# Patient Record
Sex: Male | Born: 1975 | Race: Black or African American | Hispanic: No | Marital: Married | Smoking: Current every day smoker
Health system: Southern US, Community
[De-identification: ages and names within clinical notes are randomized; demographics above are authoritative.]

## PROBLEM LIST (undated history)

## (undated) DIAGNOSIS — R251 Tremor, unspecified: Secondary | ICD-10-CM

## (undated) DIAGNOSIS — F99 Mental disorder, not otherwise specified: Secondary | ICD-10-CM

## (undated) DIAGNOSIS — E1165 Type 2 diabetes mellitus with hyperglycemia: Secondary | ICD-10-CM

## (undated) DIAGNOSIS — F209 Schizophrenia, unspecified: Secondary | ICD-10-CM

## (undated) DIAGNOSIS — I10 Essential (primary) hypertension: Secondary | ICD-10-CM

## (undated) HISTORY — PX: OTHER SURGICAL HISTORY: SHX169

---

## 1997-12-06 ENCOUNTER — Encounter: Admission: RE | Admit: 1997-12-06 | Discharge: 1997-12-06 | Payer: Self-pay | Admitting: Internal Medicine

## 1999-02-23 ENCOUNTER — Emergency Department (HOSPITAL_COMMUNITY): Admission: EM | Admit: 1999-02-23 | Discharge: 1999-02-23 | Payer: Self-pay | Admitting: Emergency Medicine

## 1999-02-25 ENCOUNTER — Emergency Department (HOSPITAL_COMMUNITY): Admission: EM | Admit: 1999-02-25 | Discharge: 1999-02-25 | Payer: Self-pay | Admitting: Emergency Medicine

## 1999-03-27 ENCOUNTER — Encounter: Admission: RE | Admit: 1999-03-27 | Discharge: 1999-03-27 | Payer: Self-pay | Admitting: Internal Medicine

## 1999-04-03 ENCOUNTER — Encounter: Admission: RE | Admit: 1999-04-03 | Discharge: 1999-04-03 | Payer: Self-pay | Admitting: Internal Medicine

## 1999-04-07 ENCOUNTER — Encounter: Admission: RE | Admit: 1999-04-07 | Discharge: 1999-04-07 | Payer: Self-pay | Admitting: Internal Medicine

## 1999-12-28 ENCOUNTER — Emergency Department (HOSPITAL_COMMUNITY): Admission: EM | Admit: 1999-12-28 | Discharge: 1999-12-29 | Payer: Self-pay | Admitting: Emergency Medicine

## 1999-12-29 ENCOUNTER — Encounter: Payer: Self-pay | Admitting: Emergency Medicine

## 1999-12-29 ENCOUNTER — Emergency Department (HOSPITAL_COMMUNITY): Admission: EM | Admit: 1999-12-29 | Discharge: 1999-12-29 | Payer: Self-pay | Admitting: Emergency Medicine

## 2009-10-05 ENCOUNTER — Emergency Department (HOSPITAL_COMMUNITY): Admission: EM | Admit: 2009-10-05 | Discharge: 2009-10-05 | Payer: Self-pay | Admitting: Emergency Medicine

## 2010-02-16 ENCOUNTER — Ambulatory Visit: Payer: Self-pay | Admitting: Nurse Practitioner

## 2010-02-16 ENCOUNTER — Encounter (INDEPENDENT_AMBULATORY_CARE_PROVIDER_SITE_OTHER): Payer: Self-pay | Admitting: Nurse Practitioner

## 2010-02-16 DIAGNOSIS — R5383 Other fatigue: Secondary | ICD-10-CM

## 2010-02-16 DIAGNOSIS — R0602 Shortness of breath: Secondary | ICD-10-CM

## 2010-02-16 DIAGNOSIS — M2550 Pain in unspecified joint: Secondary | ICD-10-CM | POA: Insufficient documentation

## 2010-02-16 DIAGNOSIS — R5381 Other malaise: Secondary | ICD-10-CM

## 2010-02-16 LAB — CONVERTED CEMR LAB
ALT: 16 units/L (ref 0–53)
AST: 20 units/L (ref 0–37)
Albumin: 4.6 g/dL (ref 3.5–5.2)
Alkaline Phosphatase: 74 units/L (ref 39–117)
Anti Nuclear Antibody(ANA): NEGATIVE
BUN: 10 mg/dL (ref 6–23)
Basophils Absolute: 0 10*3/uL (ref 0.0–0.1)
Basophils Relative: 0 % (ref 0–1)
CO2: 28 meq/L (ref 19–32)
CRP: 0.1 mg/dL (ref <0.6)
Calcium: 9.7 mg/dL (ref 8.4–10.5)
Chlamydia, Swab/Urine, PCR: NEGATIVE
Chloride: 101 meq/L (ref 96–112)
Creatinine, Ser: 0.83 mg/dL (ref 0.40–1.50)
Eosinophils Absolute: 0.2 10*3/uL (ref 0.0–0.7)
Eosinophils Relative: 4 % (ref 0–5)
GC Probe Amp, Urine: NEGATIVE
Glucose, Bld: 93 mg/dL (ref 70–99)
HCT: 41.9 % (ref 39.0–52.0)
Hemoglobin: 13.5 g/dL (ref 13.0–17.0)
Lymphocytes Relative: 48 % — ABNORMAL HIGH (ref 12–46)
Lymphs Abs: 2 10*3/uL (ref 0.7–4.0)
MCHC: 32.2 g/dL (ref 30.0–36.0)
MCV: 89.3 fL (ref 78.0–100.0)
Monocytes Absolute: 0.5 10*3/uL (ref 0.1–1.0)
Monocytes Relative: 11 % (ref 3–12)
Neutro Abs: 1.5 10*3/uL — ABNORMAL LOW (ref 1.7–7.7)
Neutrophils Relative %: 37 % — ABNORMAL LOW (ref 43–77)
Platelets: 307 10*3/uL (ref 150–400)
Potassium: 4.3 meq/L (ref 3.5–5.3)
RBC: 4.69 M/uL (ref 4.22–5.81)
RDW: 15.2 % (ref 11.5–15.5)
Rapid HIV Screen: NEGATIVE
Rheumatoid fact SerPl-aCnc: <20 intl units/mL (ref 0–20)
Sed Rate: 1 mm/hr (ref 0–16)
Sodium: 142 meq/L (ref 135–145)
TSH: 2.428 microintl units/mL (ref 0.350–4.500)
Total Bilirubin: 0.6 mg/dL (ref 0.3–1.2)
Total Protein: 7 g/dL (ref 6.0–8.3)
WBC: 4.1 10*3/uL (ref 4.0–10.5)

## 2010-02-17 ENCOUNTER — Encounter (INDEPENDENT_AMBULATORY_CARE_PROVIDER_SITE_OTHER): Payer: Self-pay | Admitting: Nurse Practitioner

## 2010-03-30 NOTE — Letter (Signed)
Summary: *HSN Results Follow up  [facility]  [address] [place], [place] [address]   Phone: [phone]  Fax: [phone]      02/17/2010   Bryan [name] [address] [place], [place]  [address]   Dear  Mr. Bryan [name],                            ____S.Drinkard,FNP   ____D. Gore,FNP       ____B. McPherson,MD   ____V. Rankins,MD    ____E. Mulberry,MD    __X__N. Daphine Deutscher, FNP  ____D. Reche Dixon, MD    ____K. Philipp Deputy, MD    ____Other     This letter is to inform you that your recent test(s):  _______Pap Smear    ___X____Lab Test     _______X-ray    ___X____ is within acceptable limits  _______ requires a medication change  _______ requires a follow-up lab visit  _______ requires a follow-up visit with your provider   Comments: Labs done during recent office visit are normal.       _________________________________________________________ If you have any questions, please contact our office [phone].                    Sincerely,    Lehman Prom FNP [facility]

## 2010-03-30 NOTE — Assessment & Plan Note (Signed)
Summary: NEW - Establish Care   Vital Signs:  Patient profile:   35 year old male Height:      70 inches Weight:      202.1 pounds BMI:     29.10 Temp:     97.2 degrees F oral Pulse rate:   72 / minute Pulse rhythm:   regular Resp:     16 per minute BP sitting:   118 / 84  (left arm) Cuff size:   regular  Vitals Entered By: Levon Hedger (February 16, 2010 12:02 PM)  Nutrition Counseling: Patient's BMI is greater than 25 and therefore counseled on weight management options. CC: new establish...  Is Patient Diabetic? No Pain Assessment Patient in pain? yes     Location: joints Intensity: 6  Does patient need assistance? Functional Status Self care Ambulation Normal   CC:  new establish... .  History of Present Illness:  Pt into the office to establish care. No PMH No PSH  No significant past medical history  Joint pain - Started with the back and now he has pain in multiple joints. Pain not present with he wakes in the morning Squeezing and wringing and grip with opening bottles is becoming weak. No swelling of joints no use of assistive device  Social - pt is not employed at this time. Previously employed in [employer] and in [employer]  Habits & Providers  Alcohol-Tobacco-Diet     Alcohol drinks/day: 0     Tobacco Status: never  Exercise-Depression-Behavior     Drug Use: never  Medications Prior to Update: 1)  None  Current Medications (verified): 1)  None  Allergies (verified): No Known Drug Allergies  Family History: mother - none father - none  Social History: No children Tobacco -none Drug - none ETOH - noneSmoking Status:  never Drug Use:  never  Review of Systems General:  Denies loss of appetite. CV:  Denies chest pain or discomfort. Resp:  Denies cough. GI:  Denies abdominal pain, nausea, and vomiting. Neuro:  Complains of difficulty with concentration. Psych:  Denies thoughts /plans of harming others.  Physical  Exam  General:  alert.   Head:  normocephalic.   Lungs:  normal breath sounds.   Heart:  normal rate and regular rhythm.   Abdomen:  normal bowel sounds.   Neurologic:  alert & oriented X3.   Skin:  color normal.   Psych:  Oriented X3.     Impression & Recommendations:  Problem # 1:  PAIN IN JOINT, MULTIPLE SITES (ICD-719.49) will check labs today reviewed with pt that this  Orders: T-Comprehensive Metabolic Panel [id number]) T-CBC w/Diff [phone]) T-Rheumatoid Factor [id number]) T-Sed Rate (Automated) [phone]) T-Antinuclear Antib (ANA) [phone]) T-C-Reactive Protein [id number]) T-TSH [phone])  Problem # 2:  NEED PROPHYLACTIC VACCINATION&INOCULATION FLU (ICD-V04.81) given today in office  Other Orders: T-GC Probe, urine ([id number]) Rapid HIV  (92370) UA Dipstick w/o Micro (manual) (51884) Flu Vaccine 56yrs + (16606) Admin 1st Vaccine (30160)  Patient Instructions: 1)  Labs will be checked today and you will be notified of the results. 2)  Follow up in this office in 4-6 weeks for lab results and joint pain. 3)  You have been given the flu vaccine today.   Orders Added: 1)  New Patient Level III [99203] 2)  T-Comprehensive Metabolic Panel [80053-22900] 3)  T-CBC w/Diff [10932-35573] 4)  T-Rheumatoid Factor [22025-42706] 5)  T-Sed Rate (Automated) [23762-83151] 6)  T-Antinuclear Antib (ANA) [76160-73710] 7)  T-C-Reactive Protein [62694-85462] 8)  T-TSH [  40981-19147] 9)  T-GC Probe, urine [phone] 10)  Rapid HIV  [92370] 11)  UA Dipstick w/o Micro (manual) [81002] 12)  Flu Vaccine 61yrs + [90658] 13)  Admin 1st Vaccine [90471]   Immunizations Administered:  Influenza Vaccine # 1:    Vaccine Type: Fluvax 3+    Site: left deltoid    Mfr: GlaxoSmithKline    Dose: 0.5 ml    Route: IM    Given by: Hale Drone CMA    Exp. Date: 08/26/2010    Lot #: [id number]    VIS given: 09/20/09 version given February 16, 2010.  Flu Vaccine  Consent Questions:    Do you have a history of severe allergic reactions to this vaccine? no    Any prior history of allergic reactions to egg and/or gelatin? no    Do you have a sensitivity to the preservative Thimersol? no    Do you have a past history of Guillan-Barre Syndrome? no    Do you currently have an acute febrile illness? no    Have you ever had a severe reaction to latex? no    Vaccine information given and explained to patient? yes   Immunizations Administered:  Influenza Vaccine # 1:    Vaccine Type: Fluvax 3+    Site: left deltoid    Mfr: GlaxoSmithKline    Dose: 0.5 ml    Route: IM    Given by: Hale Drone CMA    Exp. Date: 08/26/2010    Lot #: [id number]    VIS given: 09/20/09 version given February 16, 2010.  Prevention & Chronic Care Immunizations   Influenza vaccine: Fluvax 3+  (02/16/2010)    Tetanus booster: Not documented    Pneumococcal vaccine: Not documented  Other Screening   Smoking status: never  (02/16/2010)   Nursing Instructions: Give Flu vaccine today   Laboratory Results  Date/Time Received: February 16, 2010 1:37 PM   Other Tests  Rapid HIV: negative     Appended Document: U/A Lab Order    Lab Visit  Laboratory Results   Urine Tests  Date/Time Received: February 16, 2010 3:49 PM   Routine Urinalysis   Color: lt. yellow Appearance: Clear Glucose: negative   (Normal Range: Negative) Bilirubin: negative   (Normal Range: Negative) Ketone: negative   (Normal Range: Negative) Spec. Gravity: 1.025   (Normal Range: 1.003-1.035) Blood: negative   (Normal Range: Negative) pH: 7.0   (Normal Range: 5.0-8.0) Protein: negative   (Normal Range: Negative) Urobilinogen: 0.2   (Normal Range: 0-1) Nitrite: negative   (Normal Range: Negative) Leukocyte Esterace: negative   (Normal Range: Negative)      Orders Today:

## 2011-01-16 ENCOUNTER — Emergency Department (HOSPITAL_COMMUNITY)
Admission: EM | Admit: 2011-01-16 | Discharge: 2011-01-16 | Disposition: A | Discharge status: Home / Self Care | Payer: Self-pay | Attending: Emergency Medicine | Admitting: Emergency Medicine

## 2011-01-16 ENCOUNTER — Encounter: Payer: Self-pay | Admitting: *Deleted

## 2011-01-16 DIAGNOSIS — T63481A Toxic effect of venom of other arthropod, accidental (unintentional), initial encounter: Secondary | ICD-10-CM

## 2011-01-16 DIAGNOSIS — R220 Localized swelling, mass and lump, head: Secondary | ICD-10-CM | POA: Insufficient documentation

## 2011-01-16 DIAGNOSIS — T6391XA Toxic effect of contact with unspecified venomous animal, accidental (unintentional), initial encounter: Secondary | ICD-10-CM | POA: Insufficient documentation

## 2011-01-16 DIAGNOSIS — T63461A Toxic effect of venom of wasps, accidental (unintentional), initial encounter: Secondary | ICD-10-CM | POA: Insufficient documentation

## 2011-01-16 DIAGNOSIS — T63441A Toxic effect of venom of bees, accidental (unintentional), initial encounter: Secondary | ICD-10-CM

## 2011-01-16 MED ORDER — PREDNISONE 20 MG PO TABS
60.0000 mg | ORAL_TABLET | Freq: Once | ORAL | Status: AC
  Administered 2011-01-16: 60 mg via ORAL
  Filled 2011-01-16: qty 3

## 2011-01-16 MED ORDER — DIPHENHYDRAMINE HCL 25 MG PO CAPS
25.0000 mg | ORAL_CAPSULE | Freq: Once | ORAL | Status: AC
  Administered 2011-01-16: 25 mg via ORAL
  Filled 2011-01-16: qty 1

## 2011-01-16 MED ORDER — PREDNISONE 20 MG PO TABS: 40.0000 mg | ORAL_TABLET | Freq: Every day | ORAL | Status: AC

## 2011-01-16 MED ORDER — DIPHENHYDRAMINE HCL 25 MG PO CAPS: 25.0000 mg | ORAL_CAPSULE | Freq: Three times a day (TID) | ORAL | Status: DC

## 2011-01-16 NOTE — ED Provider Notes (Signed)
See separate note  Hilario Quarry, MD 01/16/11 [phone]

## 2011-01-16 NOTE — ED Provider Notes (Signed)
History     CSN: [id number] Arrival date & time: 01/16/2011  8:23 AM   First MD Initiated Contact with Patient 01/16/11 [phone]      Chief Complaint  Patient presents with  . Insect Bite    (Consider location/radiation/quality/duration/timing/severity/associated sxs/prior treatment) Patient is a 35 y.o. male presenting with allergic reaction. The history is provided by the patient.  Allergic Reaction The primary symptoms do not include wheezing, nausea, vomiting, palpitations, angioedema or urticaria. Primary symptoms comment: swelling of face The current episode started 2 days ago. The problem has not changed since onset.This is a new problem.  The onset of the reaction was associated with insect bite/sting. Significant symptoms that are not present include eye redness, flushing, rhinorrhea or itching.  PT reports swelling to face after insect sting 2 days ago.  Took benadryl yesterday with no relief of symptoms.  Denies SOB, wheezing, cough.  No aggrevating symptoms noted.  No fever.    No past medical history on file.  No past surgical history on file.  No family history on file.  History  Substance Use Topics  . Smoking status: Not on file  . Smokeless tobacco: Not on file  . Alcohol Use: Not on file      Review of Systems  Constitutional: Negative for fever and fatigue.  HENT: Negative for rhinorrhea.   Eyes: Negative for redness.  Respiratory: Negative for wheezing and stridor.   Cardiovascular: Negative for palpitations.  Gastrointestinal: Negative for nausea and vomiting.  Skin: Negative for flushing and itching.  All other systems reviewed and are negative.    Allergies  Review of patient's allergies indicates no known allergies.  Home Medications   Current Outpatient Rx  Name Route Sig Dispense Refill  . QUETIAPINE FUMARATE 50 MG PO TABS Oral Take 50 mg by mouth at bedtime.        BP 143/92  Pulse 103  Temp(Src) 98 F (36.7 C) (Oral)  SpO2  98%  Physical Exam  Nursing note and vitals reviewed. Constitutional: He is oriented to person, place, and time. He appears well-developed and well-nourished. No distress.  HENT:  Head: Normocephalic and atraumatic.       Mild swelling noted to forehead  Eyes: Conjunctivae are normal. Pupils are equal, round, and reactive to light.  Neck: Normal range of motion. Neck supple. No thyromegaly present.  Cardiovascular: Normal rate, regular rhythm and normal heart sounds.   Pulmonary/Chest: Effort normal and breath sounds normal. No respiratory distress.  Abdominal: Soft. There is no tenderness. There is no rebound and no guarding.  Musculoskeletal: Normal range of motion. He exhibits no edema and no tenderness.  Neurological: He is alert and oriented to person, place, and time. No cranial nerve deficit. Coordination normal.  Skin: Skin is warm and dry. No rash noted.    ED Course  Procedures (including critical care time)  Labs Reviewed - No data to display No results found.   No diagnosis found.    MDM  Pt presented due to being stung on face 2 days ago.  Presents due to swelling of L face.  Very mild on exam.  States took benadryl yesterday.  No rash anywhere else.  No signs of anaphylaxis.  Doubt infectious cause given that he states he was stung.  Will give another dose of benadryl and d/c home on prednisone.          Nena Alexander, MD 01/16/11 [phone]

## 2011-01-16 NOTE — ED Notes (Signed)
I saw and evaluated the patient, reviewed the resident's note and I agree with the findings and plan.  Patient with insect sting to face x 2 two days ago with some local reaction.  Plan benadryl and perdnisone.  No difficulty swallowing, speaking,or breathing.    Hilario Quarry, MD 01/16/11 [phone]

## 2011-01-16 NOTE — ED Notes (Signed)
Pt states he was stung twice 2 days ago by a bee and has swelling to left upper lip and forehead. Here for eval

## 2011-01-16 NOTE — ED Notes (Signed)
Pt verbalized understanding of D/c instructions and need for followup care if patient experiences throat swelling, difficulty breathing or chest pain. Pt left dept ambulatory to D/C window

## 2011-08-08 ENCOUNTER — Observation Stay (HOSPITAL_COMMUNITY)
Admission: EM | Admit: 2011-08-08 | Discharge: 2011-08-10 | Disposition: A | Discharge status: Home / Self Care | Payer: Medicaid Other | Source: Ambulatory Visit | Attending: Family Medicine | Admitting: Family Medicine

## 2011-08-08 ENCOUNTER — Emergency Department (HOSPITAL_COMMUNITY): Payer: Medicaid Other

## 2011-08-08 ENCOUNTER — Encounter (HOSPITAL_COMMUNITY): Payer: Self-pay | Admitting: Family Medicine

## 2011-08-08 DIAGNOSIS — R569 Unspecified convulsions: Secondary | ICD-10-CM

## 2011-08-08 DIAGNOSIS — F259 Schizoaffective disorder, unspecified: Principal | ICD-10-CM

## 2011-08-08 DIAGNOSIS — G40909 Epilepsy, unspecified, not intractable, without status epilepticus: Secondary | ICD-10-CM

## 2011-08-08 DIAGNOSIS — F17200 Nicotine dependence, unspecified, uncomplicated: Secondary | ICD-10-CM | POA: Insufficient documentation

## 2011-08-08 DIAGNOSIS — F99 Mental disorder, not otherwise specified: Secondary | ICD-10-CM

## 2011-08-08 DIAGNOSIS — F258 Other schizoaffective disorders: Secondary | ICD-10-CM

## 2011-08-08 HISTORY — DX: Mental disorder, not otherwise specified: F99

## 2011-08-08 HISTORY — DX: Tremor, unspecified: R25.1

## 2011-08-08 LAB — COMPREHENSIVE METABOLIC PANEL
Albumin: 4.2 g/dL (ref 3.5–5.2)
Alkaline Phosphatase: 87 U/L (ref 39–117)
BUN: 14 mg/dL (ref 6–23)
Calcium: 9.4 mg/dL (ref 8.4–10.5)
Creatinine, Ser: 1.01 mg/dL (ref 0.50–1.35)
Potassium: 3.6 mEq/L (ref 3.5–5.1)
Total Protein: 7 g/dL (ref 6.0–8.3)

## 2011-08-08 LAB — CBC
Hemoglobin: 14.8 g/dL (ref 13.0–17.0)
MCH: 30.5 pg (ref 26.0–34.0)
MCHC: 34.4 g/dL (ref 30.0–36.0)
Platelets: 286 10*3/uL (ref 150–400)

## 2011-08-08 LAB — RAPID URINE DRUG SCREEN, HOSP PERFORMED
Barbiturates: NOT DETECTED
Benzodiazepines: NOT DETECTED
Cocaine: NOT DETECTED
Tetrahydrocannabinol: POSITIVE — AB

## 2011-08-08 LAB — DIFFERENTIAL
Basophils Relative: 0 % (ref 0–1)
Eosinophils Absolute: 0.1 10*3/uL (ref 0.0–0.7)
Eosinophils Relative: 2 % (ref 0–5)
Monocytes Relative: 12 % (ref 3–12)
Neutrophils Relative %: 51 % (ref 43–77)

## 2011-08-08 LAB — POCT I-STAT, CHEM 8
Calcium, Ion: 1.22 mmol/L (ref 1.12–1.32)
Glucose, Bld: 99 mg/dL (ref 70–99)
HCT: 42 % (ref 39.0–52.0)
Hemoglobin: 14.3 g/dL (ref 13.0–17.0)

## 2011-08-08 LAB — URINALYSIS, ROUTINE W REFLEX MICROSCOPIC
Ketones, ur: 15 mg/dL — AB
Nitrite: NEGATIVE
Urobilinogen, UA: 1 mg/dL (ref 0.0–1.0)

## 2011-08-08 MED ORDER — LORAZEPAM 2 MG/ML IJ SOLN
INTRAMUSCULAR | Status: AC
  Administered 2011-08-08: 2 mg
  Filled 2011-08-08: qty 3

## 2011-08-08 MED ORDER — LORAZEPAM 2 MG/ML IJ SOLN
2.0000 mg | Freq: Once | INTRAMUSCULAR | Status: AC
  Administered 2011-08-08: 2 mg via INTRAVENOUS

## 2011-08-08 MED ORDER — FOLIC ACID 5 MG/ML IJ SOLN
1.0000 mg | Freq: Every day | INTRAMUSCULAR | Status: DC
  Administered 2011-08-09: 1 mg via INTRAVENOUS
  Filled 2011-08-08 (×3): qty 0.2

## 2011-08-08 MED ORDER — LORAZEPAM 2 MG/ML IJ SOLN
INTRAMUSCULAR | Status: AC
  Filled 2011-08-08: qty 1

## 2011-08-08 MED ORDER — LORAZEPAM 2 MG/ML IJ SOLN: 2.0000 mg | Freq: Once | INTRAMUSCULAR | Status: DC

## 2011-08-08 MED ORDER — HEPARIN SODIUM (PORCINE) 5000 UNIT/ML IJ SOLN
5000.0000 [IU] | Freq: Three times a day (TID) | INTRAMUSCULAR | Status: DC
  Filled 2011-08-08: qty 1

## 2011-08-08 MED ORDER — HEPARIN SODIUM (PORCINE) 5000 UNIT/ML IJ SOLN
5000.0000 [IU] | Freq: Three times a day (TID) | INTRAMUSCULAR | Status: DC
  Administered 2011-08-09 – 2011-08-10 (×4): 5000 [IU] via SUBCUTANEOUS
  Filled 2011-08-08 (×7): qty 1

## 2011-08-08 MED ORDER — POTASSIUM CHLORIDE IN NACL 20-0.9 MEQ/L-% IV SOLN
INTRAVENOUS | Status: DC
  Administered 2011-08-09 – 2011-08-10 (×2): via INTRAVENOUS
  Filled 2011-08-08 (×9): qty 1000

## 2011-08-08 MED ORDER — VITAMIN B-1 100 MG PO TABS
100.0000 mg | ORAL_TABLET | Freq: Every day | ORAL | Status: DC
  Administered 2011-08-08 – 2011-08-09 (×2): 100 mg via ORAL
  Filled 2011-08-08 (×3): qty 1

## 2011-08-08 NOTE — ED Notes (Signed)
Patient states she is unable to sleep and he is very concerned about his children that is has not seen or cared for and states this is when the tremors started. Patient exhibiting jerking motion in his arms and legs while talking with nurse. Patient states he smoked mariajuana x 2 days ago and the tremors have been worse. Patient placed on monitor and sats of 98% RA.

## 2011-08-08 NOTE — ED Notes (Signed)
Patient woke up briefly and now is sleeping again. Patient remains on monitor and 4L oxygen with sats of 100%. NAD at this time.

## 2011-08-08 NOTE — ED Notes (Signed)
Patient starting to shake and tremble with HR 125 and arms and legs stiffened. Patient eyes are open but unable to speak. Episode last approx 1 minute. Dr. Radford Pax at bedside and witnessed possible seizure activity.  Patient placed on 4L oxygen and 8mg  Ativan administered (2 mg at a time) and patient placed on monitor and resting at this time.

## 2011-08-08 NOTE — ED Notes (Signed)
Admitting MD at bedside, pt awaiting inpt beds assignment.  

## 2011-08-08 NOTE — H&P (Signed)
Family Medicine Teaching [facility] Admission History and Physical  Patient name: Bryan [name] Medical record number: [id number] Date of birth: 1975/05/23 Age: 36 y.o. Gender: male  Primary Care Provider: No primary provider on file. Psychiatrist: Seen regularly at [facility]  Chief Complaint: seizure activity History of Present Illness: Bryan [name] is a 37 y.o. year old male with an unknown psychiatric disorder on seroquel but no known history of seizure disorder presenting after possible seizure activity.   Per EMS report, patient was found in front of a bank today having jerking motions with some nonsensical talking. There was concern for possible seizures. In the emergency room, he had several periods of staring spells associated with tonic clonic movements of upper and lower extremities. He received Ativan 2mg  on 4 separate occasions. There was no tongue biting, loss of bowel or bladder during these incidents. An initial head CT was negative. FMTS consulted for admission.   Per patient, he says that he has been having a "breakdown" from "being upset". He has been having a lot of different thoughts which makes it difficult to function. He cannot describe whether these are racing thoughts or whether he has been unable to sleep in recent nights. He cannot describe where these thoughts stem from. Patient says a thought of suicidal ideation crossed his mind earlier today but that he was able to block it out. No HI. Does say he is seen at [facility] and treated with Seroquel but doesn't know specifically why. He says seroquel helps calm him down. Does say he has a history of hearing voices and that he still hears them sometimes on the medicine. No commanding voices.   Per family, attempted to reach next of kin. Were able to communicate with mother who says patient has a history of mental health issues but says she does not have time to explain further. She will be coming to hospital  within the next few hours to be able to discuss further.    Past Medical History  Diagnosis Date  . Tremors of nervous system   . Psychiatric problem     Seen at [facility] for unknown reason. Takes seroquel. History of hearing voices.  Not commandivng voices.     Past Surgical History  Procedure Date  . None    Family history History reviewed. No pertinent family history. Social History:  reports that he has been smoking Cigarettes.  He has been smoking about 1 pack per day. He does not have any smokeless tobacco history on file. He reports that he drinks alcohol. He reports that he uses illicit drugs.  Allergies: No Known Allergies  No current facility-administered medications on file prior to encounter.   Current Outpatient Prescriptions on File Prior to Encounter  Medication Sig Dispense Refill  . QUEtiapine (SEROQUEL) 50 MG tablet Take 50 mg by mouth at bedtime.         Results for orders placed during the hospital encounter of 08/08/11 (from the past 48 hour(s))  POCT I-STAT, CHEM 8     Status: Normal   Collection Time   08/08/11  4:17 PM      Component Value Range Comment   Sodium 144  135 - 145 mEq/L    Potassium 3.5  3.5 - 5.1 mEq/L    Chloride 106  96 - 112 mEq/L    BUN 13  6 - 23 mg/dL    Creatinine, Ser 9.52  0.50 - 1.35 mg/dL    Glucose, Bld 99  70 - 99 mg/dL    Calcium, Ion 1.61  0.96 - 1.32 mmol/L    TCO2 24  0 - 100 mmol/L    Hemoglobin 14.3  13.0 - 17.0 g/dL    HCT 04.5  40.9 - 81.1 %   URINE RAPID DRUG SCREEN (HOSP PERFORMED)     Status: Abnormal   Collection Time   08/08/11  7:09 PM      Component Value Range Comment   Opiates NONE DETECTED  NONE DETECTED    Cocaine NONE DETECTED  NONE DETECTED    Benzodiazepines NONE DETECTED  NONE DETECTED    Amphetamines NONE DETECTED  NONE DETECTED    Tetrahydrocannabinol POSITIVE (*) NONE DETECTED    Barbiturates NONE DETECTED  NONE DETECTED   URINALYSIS, ROUTINE W REFLEX MICROSCOPIC     Status: Abnormal    Collection Time   08/08/11  7:09 PM      Component Value Range Comment   Color, Urine AMBER (*) YELLOW BIOCHEMICALS MAY BE AFFECTED BY COLOR   APPearance CLEAR  CLEAR    Specific Gravity, Urine 1.033 (*) 1.005 - 1.030    pH 5.5  5.0 - 8.0    Glucose, UA NEGATIVE  NEGATIVE mg/dL    Hgb urine dipstick NEGATIVE  NEGATIVE    Bilirubin Urine SMALL (*) NEGATIVE    Ketones, ur 15 (*) NEGATIVE mg/dL    Protein, ur NEGATIVE  NEGATIVE mg/dL    Urobilinogen, UA 1.0  0.0 - 1.0 mg/dL    Nitrite NEGATIVE  NEGATIVE    Leukocytes, UA NEGATIVE  NEGATIVE MICROSCOPIC NOT DONE ON URINES WITH NEGATIVE PROTEIN, BLOOD, LEUKOCYTES, NITRITE, OR GLUCOSE <1000 mg/dL.  ETHANOL     Status: Normal   Collection Time   08/08/11  8:59 PM      Component Value Range Comment   Alcohol, Ethyl (B) <11  0 - 11 mg/dL   CBC     Status: Normal   Collection Time   08/08/11  9:16 PM      Component Value Range Comment   WBC 6.3  4.0 - 10.5 K/uL    RBC 4.85  4.22 - 5.81 MIL/uL    Hemoglobin 14.8  13.0 - 17.0 g/dL    HCT 91.4  78.2 - 95.6 %    MCV 88.7  78.0 - 100.0 fL    MCH 30.5  26.0 - 34.0 pg    MCHC 34.4  30.0 - 36.0 g/dL    RDW 21.3  08.6 - 57.8 %    Platelets 286  150 - 400 K/uL   DIFFERENTIAL     Status: Normal   Collection Time   08/08/11  9:16 PM      Component Value Range Comment   Neutrophils Relative 51  43 - 77 %    Neutro Abs 3.2  1.7 - 7.7 K/uL    Lymphocytes Relative 36  12 - 46 %    Lymphs Abs 2.3  0.7 - 4.0 K/uL    Monocytes Relative 12  3 - 12 %    Monocytes Absolute 0.8  0.1 - 1.0 K/uL    Eosinophils Relative 2  0 - 5 %    Eosinophils Absolute 0.1  0.0 - 0.7 K/uL    Basophils Relative 0  0 - 1 %    Basophils Absolute 0.0  0.0 - 0.1 K/uL   COMPREHENSIVE METABOLIC PANEL     Status: Normal   Collection Time   08/08/11  9:16 PM  Component Value Range Comment   Sodium 141  135 - 145 mEq/L    Potassium 3.6  3.5 - 5.1 mEq/L    Chloride 104  96 - 112 mEq/L    CO2 27  19 - 32 mEq/L    Glucose, Bld  98  70 - 99 mg/dL    BUN 14  6 - 23 mg/dL    Creatinine, Ser 1.61  0.50 - 1.35 mg/dL    Calcium 9.4  8.4 - 09.6 mg/dL    Total Protein 7.0  6.0 - 8.3 g/dL    Albumin 4.2  3.5 - 5.2 g/dL    AST 20  0 - 37 U/L    ALT 18  0 - 53 U/L    Alkaline Phosphatase 87  39 - 117 U/L    Total Bilirubin 1.0  0.3 - 1.2 mg/dL    GFR calc non Af Amer >90  >90 mL/min    GFR calc Af Amer >90  >90 mL/min   AMMONIA     Status: Normal   Collection Time   08/08/11  9:16 PM      Component Value Range Comment   Ammonia 28  11 - 60 umol/L    Ct Head Wo Contrast  08/08/2011  *RADIOLOGY REPORT*  Clinical Data: Mental status changes.  Jerking motions.  CT HEAD WITHOUT CONTRAST  Technique:  Contiguous axial images were obtained from the base of the skull through the vertex without contrast.  Comparison: None.  Findings: The brain has a normal appearance without evidence of malformation, atrophy, old or acute infarction, mass lesion, hemorrhage, hydrocephalus or extra-axial collection.  The calvarium is unremarkable.  Visualized sinuses are clear.  IMPRESSION: Normal examination.  Original Report Authenticated By: Thomasenia Sales, M.D.    ROS- see HPI  Blood pressure 131/95, pulse 79, temperature 98.3 F (36.8 C), temperature source Oral, resp. rate 20, SpO2 99.00%. Physical Exam  Constitutional: He appears well-developed and well-nourished. No distress.  HENT:  Head: Normocephalic and atraumatic.  Right Ear: External ear normal.  Left Ear: External ear normal.       Poor dentition. Slightly dry mucus membranes  Eyes: Conjunctivae and EOM are normal. Pupils are equal, round, and reactive to light.       Pupils dilated but reactive.   Neck: Normal range of motion. Neck supple.  Cardiovascular: Normal rate and regular rhythm.  Exam reveals no gallop and no friction rub.   No murmur heard. Respiratory: Effort normal and breath sounds normal. He has no wheezes. He has no rales.  GI: Soft. Bowel sounds are normal.  He exhibits no distension. There is no tenderness. There is no rebound and no guarding.  Genitourinary:       Foley catheter in place  Musculoskeletal: Normal range of motion. He exhibits no edema.  Lymphadenopathy:    He has no cervical adenopathy.  Neurological:       Patient very sleepy s/p Ativan. Can awaken for many questions. Alert to person and place. Thinks year is the 5th month and on first questioning says 56 but subsequently 2013.   Moves all extremities. No focal deficits noted. Would not participate with visual field confrontation. Able to stick tongue out, no deviation of uvula. CN III,IV, VI normal. Hearing appears intact. 5/5 muscle strength in upper and lower extremities. 2+ reflexes.   Skin: He is diaphoretic.     Assessment/Plan  36 y.o. year old male with an unknown psychiatric disorder on seroquel  but no known history of seizure disorder presenting after possible seizure activity.   1. Concern for seizure activity- unclear if patient is having seizure activity or abnormal movements associated with a psychiatric event. Difficult to obtain good history from patient and no family available. Patient currently obtunded by 8mg  total of Ativan.  -CT head negative. monitor on neuro floor with regular neuro checks -will consult neurology for further recommendations given concern for seizure activity -my primary concern is for a psychiatric disturbance as it is unclear if patient even had seizures. Will watch into morning and consider pscych inpatient referral.   -will attempt to obtain more history from family when they arrive.  -did not start prn ativan. Would like patient to clear to be able to obtain better history and potentially witness these events.  -Ethanol not elevated to indicate intoxication. Patient does not indicate regular drinking.    -possibly due to alcohol withdrawal. Will monitor patient but potentially will need CIWA protocol.   -IM thiamine and folate  already given Thoughts/less likely DDx:  -UDS negative except for marijuana which patient admits to.   -no initial electrolyte abnormalities noted.   -no hypoglycemia to account for seizure  -no history of trauma or evidence  -ammonia not elevated.   -will obtain HIV, RPR pending to evaluate for infectious causes. CBC without elevated WBC. Doubt infection.   2. Psychiatric-will hold seroquel for now as patient NPO. Will likely restart tomorrow. See problem #1  3. Tobacco abuse-unclear amount of smoking per patient. Would consider nicotine patch if patient requests. Would encourage cessation but not primary issue at this time.   4. Urinary incontinence-patient currently with foley. Once mental status clears, will remove.   FEN-NPO for now. Bedside swallow study with diet to be advanced as tolerated pending study. Patient appears mildly dehydrated so will start IVF NS 20KCL at 125 ml/hr.  PPx-Hep SQ Disposition-pending clearing of mental status and evaluation of potential seizures  HUNTER, STEPHEN 08/08/2011, 10:10 PM  PGY-3 Addendum:  HPI: Patient seen and examined agree with above assessment and plan. Briefly, this is a 36 year old male with no known past medical history who presents with concern for seizures/altered mental status. Level V caveat to history and physical secondary to decreased level of consciousness status post 8 mg of Ativan. Patient was reported to be found outside of bank having seizure-like activity with questionable reports of the patient talking to himself between episodes. There was no tongue biting or bowel or bladder incontinence associated with these episodes. Patient was then brought into the ED where patient had another episode of generalized seizure activity involving upper and lower extremities as well as staring off. Patient was then given a progressive amount of Ativan up to a total of 8 mg which stopped seizure activity. Patient has been sleeping since this  point. There is some history albeit limited. Patient denies any illicit drug use. No recent infections. No alcohol use. Patient states he has been under some stress. Patient states he takes Seroquel but does not know the name of the prescribing physician or practice.  Physical Exam: Filed Vitals:   08/08/11 2206  BP: 131/95  Pulse:   Temp: 98.3 F (36.8 C)  Resp: 20    Gen: somnolent, responsive to sternal rub HEENT: mydriatic eyes bilaterally, responsive to light stimuli, no scleral icterus CV: RRR, no murmurs PULM: CTAB, no wheezes ABD: s/NT/+ bowel sounds  EXT: 2+ peripheral pulses, no edema SKIN: No obvious skin lesions.  Seizure Activity/AMS: Given overall history unsure if this is true new onset seizure. History seems to be indicative of a baseline psych history. Initially UDS and labs are otherwise reassuring apart from + THC. Head CT negative. Will formally check alcohol level, ammonia level. Will also check CBC and CMET. Thiamine and Folate x 1. Given age we'll check HIV and RPR for possible infectious causes. Will formally consult neuro. Will hold on MRI/EEG. Will reeval once level of consciousness improves. Will also attempt to contact family for better history.   FEN/GI: bedside swallow eval, then ADAT. NS @ 125/hr  PPX: Subq heparin.

## 2011-08-08 NOTE — ED Notes (Signed)
Per EMS, pt found in front of bank..."questionable seizure". sts patient having jerking motions and talking in the middle of his seizure. Pt alert and oriented. 16 in LAC

## 2011-08-08 NOTE — ED Notes (Signed)
Patient transported to CT 

## 2011-08-08 NOTE — ED Provider Notes (Signed)
History     CSN: [id number]  Arrival date & time 08/08/11  1448   First MD Initiated Contact with Patient 08/08/11 1520      Chief Complaint  Patient presents with  . Seizures     HPI Per EMS, pt found in front of bank..."questionable seizure". sts patient having jerking motions and talking in the middle of his seizure.  Patient's has no known history of seizure disorder.  He told the nurse that he was under increasing stress.  Past Medical History  Diagnosis Date  . Tremors of nervous system     History reviewed. No pertinent past surgical history.  History reviewed. No pertinent family history.  History  Substance Use Topics  . Smoking status: Not on file  . Smokeless tobacco: Not on file  . Alcohol Use:       Review of Systems  Unable to perform ROS   Allergies  Review of patient's allergies indicates no known allergies.  Home Medications   Current Outpatient Rx  Name Route Sig Dispense Refill  . QUETIAPINE FUMARATE 50 MG PO TABS Oral Take 50 mg by mouth at bedtime.        BP 112/79  Pulse 79  Temp 98.5 F (36.9 C)  Resp 17  SpO2 97%  Physical Exam  Nursing note and vitals reviewed. Constitutional: He appears well-developed and well-nourished. No distress.  HENT:  Head: Normocephalic and atraumatic.  Eyes: Pupils are equal, round, and reactive to light.  Neck: Normal range of motion.  Cardiovascular: Normal rate and intact distal pulses.   Pulmonary/Chest: No respiratory distress.  Abdominal: Normal appearance. He exhibits no distension.  Musculoskeletal: Normal range of motion.  Neurological: No cranial nerve deficit.       Patient having bizarre staring spells associated with tonic-clonic movements of upper and lower extremities all occurring with the eyes open.  No tongue biting or no urinary incontinence.  Skin: Skin is warm and dry. No rash noted.  Psychiatric: He has a normal mood and affect. His behavior is normal.    ED Course    Procedures (including critical care time) Scheduled Meds:    . folic acid  1 mg Intravenous Daily  . heparin  5,000 Units Subcutaneous Q8H  . LORazepam      . LORazepam  2 mg Intravenous Once  . LORazepam  2 mg Intravenous Once  . LORazepam  2 mg Intravenous Once  . LORazepam  2 mg Intravenous Once  . thiamine  100 mg Oral Daily   Continuous Infusions:  PRN Meds:. After 8 mg of IV Ativan patient's seizure type activity ceased.  He maintained his oxygen saturation at 100% pre-and post medication.  CRITICAL CARE Performed by: Nelia Shi   Total critical care time: 30 min   Critical care time was exclusive of separately billable procedures and treating other patients.  Critical care was necessary to treat or prevent imminent or life-threatening deterioration.  Critical care was time spent personally by me on the following activities: development of treatment plan with patient and/or surrogate as well as nursing, discussions with consultants, evaluation of patient's response to treatment, examination of patient, obtaining history from patient or surrogate, ordering and performing treatments and interventions, ordering and review of laboratory studies, ordering and review of radiographic studies, pulse oximetry and re-evaluation of patient's condition.  Labs Reviewed  URINE RAPID DRUG SCREEN (HOSP PERFORMED) - Abnormal; Notable for the following:    Tetrahydrocannabinol POSITIVE (*)     All  other components within normal limits  URINALYSIS, ROUTINE W REFLEX MICROSCOPIC - Abnormal; Notable for the following:    Color, Urine AMBER (*)  BIOCHEMICALS MAY BE AFFECTED BY COLOR   Specific Gravity, Urine 1.033 (*)     Bilirubin Urine SMALL (*)     Ketones, ur 15 (*)     All other components within normal limits  POCT I-STAT, CHEM 8  ETHANOL  CBC  DIFFERENTIAL  COMPREHENSIVE METABOLIC PANEL  HIV ANTIBODY (ROUTINE TESTING)  RPR  AMMONIA   Ct Head Wo Contrast  08/08/2011   *RADIOLOGY REPORT*  Clinical Data: Mental status changes.  Jerking motions.  CT HEAD WITHOUT CONTRAST  Technique:  Contiguous axial images were obtained from the base of the skull through the vertex without contrast.  Comparison: None.  Findings: The brain has a normal appearance without evidence of malformation, atrophy, old or acute infarction, mass lesion, hemorrhage, hydrocephalus or extra-axial collection.  The calvarium is unremarkable.  Visualized sinuses are clear.  IMPRESSION: Normal examination.  Original Report Authenticated By: Thomasenia Sales, M.D.     1. Seizure disorder       MDM         Nelia Shi, MD 08/08/11 2150

## 2011-08-09 ENCOUNTER — Ambulatory Visit (HOSPITAL_COMMUNITY): Payer: Medicaid Other

## 2011-08-09 ENCOUNTER — Encounter (HOSPITAL_COMMUNITY): Payer: Self-pay | Admitting: *Deleted

## 2011-08-09 DIAGNOSIS — F988 Other specified behavioral and emotional disorders with onset usually occurring in childhood and adolescence: Secondary | ICD-10-CM

## 2011-08-09 DIAGNOSIS — F259 Schizoaffective disorder, unspecified: Secondary | ICD-10-CM

## 2011-08-09 DIAGNOSIS — R569 Unspecified convulsions: Secondary | ICD-10-CM | POA: Diagnosis present

## 2011-08-09 LAB — HIV ANTIBODY (ROUTINE TESTING W REFLEX): HIV: NONREACTIVE

## 2011-08-09 LAB — RPR: RPR Ser Ql: NONREACTIVE

## 2011-08-09 MED ORDER — FOLIC ACID 1 MG PO TABS
1.0000 mg | ORAL_TABLET | Freq: Every day | ORAL | Status: DC
  Administered 2011-08-09: 1 mg via ORAL
  Filled 2011-08-09 (×2): qty 1

## 2011-08-09 MED ORDER — QUETIAPINE FUMARATE 50 MG PO TABS
50.0000 mg | ORAL_TABLET | Freq: Every day | ORAL | Status: DC
  Filled 2011-08-09: qty 1

## 2011-08-09 MED ORDER — QUETIAPINE FUMARATE ER 50 MG PO TB24
150.0000 mg | ORAL_TABLET | Freq: Every day | ORAL | Status: DC
  Administered 2011-08-09: 150 mg via ORAL
  Filled 2011-08-09 (×2): qty 3

## 2011-08-09 MED ORDER — QUETIAPINE FUMARATE 50 MG PO TABS: 150.0000 mg | ORAL_TABLET | Freq: Every day | ORAL | Status: DC

## 2011-08-09 NOTE — Consult Note (Addendum)
Patient Identification:  Bryan [name] Date of Evaluation:  08/09/2011 Reason for Consult: Suicidal Ideation  Referring Provider: Dr Bryan [name] History of Present Illness: Pt was in community and began having jerking movements and is brought to  ED.   Past Psychiatric History: unable to read, calculate math  Past Medical History:     Past Medical History  Diagnosis Date  . Tremors of nervous system   . Psychiatric problem     Seen at [facility] for unknown reason. Takes seroquel. History of hearing voices.  Not commandivng voices.        Past Surgical History  Procedure Date  . None     Allergies: No Known Allergies  Current Medications:  Prior to Admission medications   Medication Sig Start Date End Date Taking? Authorizing Provider  QUEtiapine (SEROQUEL) 50 MG tablet Take 50 mg by mouth at bedtime.    Yes Historical Provider, MD    Social History:    reports that he has been smoking Cigarettes.  He has been smoking about 1 pack per day. He does not have any smokeless tobacco history on file. He reports that he drinks alcohol. He reports that he uses illicit drugs.   Family History:    History reviewed. No pertinent family history.  Mental Status Examination/Evaluation: Objective:  Appearance: Well Groomed  Psychomotor Activity:  Increased  Eye Contact::  Good  Speech:  Clear and Coherent  Volume:  Normal  Mood:  Anxious  Affect:  Flat  Thought Process:  Disorganized  Orientation:  Other:  confused non-specific  Thought Content:  Delusions  Suicidal Thoughts:  No  Homicidal Thoughts:  No  Judgement:  Poor  Insight:  Fair    DIAGNOSIS:   AXIS I   Schizoaffective Disorder, ADD   AXIS II  Deffered  AXIS III See medical notes.  AXIS IV economic problems, educational problems, housing problems, occupational problems, other psychosocial or environmental problems, problems related to social environment and problems with access to health care services  AXIS V 61-70  mild symptoms   Assessment/Plan: Discussed with coverage MD for Dr. Durene [name].  Discussed with mother Bryan [name] Pt is sitting up and speaks spontaneously.  He has a rambling description of how he cannot plan and think when he is in the community.  He begins perseverating about his children [How many?  15].   He ruminates about guilt of not caring for them and worries how they may be in 'trouble' .  He says he cannot read and retain information.  He has worked in [employer] places but is not working now.  He says he is in a neighborhood that is undesirable.   He also has his brother at his place in and out.  He denies drug use, alcohol use.  He smokes ~ 3 cigarettes/day.   He denies SI HI/VH/AH.  iNTERVIEW WITH MOTHER.  She has taken care of her sons [3] and has encouraged him to attend a reading class.  She says he does not have 15 children.  He has been more isolated and does not do much.   There is an apparent thought disorder where he may have hx of developmental delay.  He had Special Ed. And he may have a degree of ADD supported by his problems of reading with poor retention; planning to do things when he leaves his home and getting all confused.  A trial of stimulant Claiborne Billings is least diverted] is suggested to observe for any improvement.  Also  IF tolerated and if he adheres to plan, an increase of SGA Seroquel quetiapine XR !50 mg may improve cognitive processing.  RECOMMENDATION:  1.  Consider increase Seroquel XR 150 mg daily for thought disorder Schizoaffective sx 2. Consider Vyvanse 50 mg daily in am for ADD 3. Pt has guardian, mother, and may not have capacity to function totally on his own but may be discharged to her supervision when medically stable.  4. Consider referral to outpatient job training/coaching 5. No further psychiatric needs unless requested.  MD Psychiatrist signs off.  Iyana Topor J. Ferol Luz, MD Psychiatrist 08/09/2011 6:08 PM

## 2011-08-09 NOTE — Progress Notes (Signed)
Spoke with Dr. Georgiana Shore of Neurology who recommended ordering an EEG and MRI and calling neurology back in the AM for full consult. No acute needs at this time for neurology. I have ordered these at this time.   Tana Conch, MD, PGY1 08/09/2011 12:02 AM

## 2011-08-09 NOTE — Progress Notes (Signed)
PGY-1 Daily Progress Note Family Medicine Teaching Service Aldine Contes. Marti Sleigh, MD Service Pager: [phone]   Subjective: More awake and alert this AM. Patient much more awake this morning. He at this time says he remembers all of the episodes yesterday and denies any confused state afterwards. He says that anxiety from life caused a lot of stress and he ended up having uncontrolled shaking of his extremities intermittently. Patient denies SI  Objective:  Temp:  [97.6 F (36.4 C)-98.5 F (36.9 C)] 97.6 F (36.4 C) (06/13 0542) Pulse Rate:  [65-98] 65  (06/13 0542) Resp:  [16-20] 20  (06/13 0542) BP: (108-146)/(66-95) 121/82 mmHg (06/13 0542) SpO2:  [96 %-99 %] 97 % (06/13 0542) Weight:  [183 lb 11.2 oz (83.326 kg)] 183 lb 11.2 oz (83.326 kg) (06/12 2358)  Intake/Output Summary (Last 24 hours) at 08/09/11 0639 Last data filed at 08/08/11 2300  Gross per 24 hour  Intake      0 ml  Output    300 ml  Net   -300 ml    Gen:  NAD, standing up brushing teeth in front of mirror HEENT: moist mucous membranes, poor dentition CV: Regular rate and rhythm, no murmurs rubs or gallops PULM: clear to auscultation bilaterally. No wheezes/rales/rhonchi ABD: soft/nontender/nondistended/normal bowel sounds EXT: No edema Neuro: Alert and oriented x3 when previously only oriented x2, CN II-XII intact, 5/5 muscle strength bilateral upper and lower extremities Genital: foley catheter has been removed  Labs and imaging:   CBC  Lab 08/08/11 2116 08/08/11 1617  WBC 6.3 --  HGB 14.8 14.3  HCT 43.0 42.0  PLT 286 --   BMET/CMET  Lab 08/08/11 2116 08/08/11 1617  NA 141 144  K 3.6 3.5  CL 104 106  CO2 27 --  BUN 14 13  CREATININE 1.01 1.00  CALCIUM 9.4 --  PROT 7.0 --  BILITOT 1.0 --  ALKPHOS 87 --  ALT 18 --  AST 20 --  GLUCOSE 98 99    Ct Head Wo Contrast  08/08/2011  *RADIOLOGY REPORT*  Clinical Data: Mental status changes.  Jerking motions.  CT HEAD WITHOUT CONTRAST  Technique:   Contiguous axial images were obtained from the base of the skull through the vertex without contrast.  Comparison: None.  Findings: The brain has a normal appearance without evidence of malformation, atrophy, old or acute infarction, mass lesion, hemorrhage, hydrocephalus or extra-axial collection.  The calvarium is unremarkable.  Visualized sinuses are clear.  IMPRESSION: Normal examination.  Original Report Authenticated By: Thomasenia Sales, M.D.     Assessment  36 y.o. year old male with an unknown psychiatric disorder on seroquel but no known history of seizure disorder presenting after possible seizure activity.   #. Psychiatric-will consult psychiatry given RN report of suicidal ideation although patient currently denies SI. Given patient reports of high stress and "breakdown" yesterday would also like psych to assess for safety/sanity as patient has a history of an unknown psychiatric disorder which patient says involves hearing voices.   - will restart seroquel tonight.    # Concern for seizure activity- Given that patient remembers all of the events and clearly was having a stressful period, highly doubt seizure activity at this time. my primary concern is for a psychiatric disturbance as does not appear patient had seizures.   -CT head negative. continue monitor on neuro floor with regular neuro checks.   -originally planned to consult neurology but patient remembers all events so not likely seizure activity.  Have cancelled MRI and EEG  -Ethanol not elevated to indicate intoxication. Patient does not indicate regular drinking.    -possibly due to alcohol withdrawal. Will monitor patient but potentially will need CIWA protocol.    -IM thiamine and folate already given   Thoughts/less likely DDx:    -UDS negative except for marijuana which patient admits to.    -no initial electrolyte abnormalities noted.    -no hypoglycemia to account for seizure    -no history of trauma or evidence     -ammonia not elevated.    - HIV, RPR non reactive as well as CBC without elevated WBC. Doubt infectious source.  #. Tobacco abuse-r nicotine patch if patient requests. Would encourage cessation but not primary issue at this time.   FEN-regular diet after passing bedside swallow eval. Continue IVF NS 20KCL at 125 ml/hr for now.  PPx-Hep SQ  Disposition-pending psychiatry evaluation.   Tana Conch, MD PGY1, Family Medicine Teaching Service [phone]

## 2011-08-09 NOTE — H&P (Signed)
FMTS Attending Admission Note: Bryan Levy MD [phone] pager office [phone] I  have seen and examined this patient, reviewed their chart. I have discussed this patient with the resident. I agree with the resident's findings, assessment and care plan. I interviewed Bryan [name] and his mother who was at bedside this morning. Bryan [name] relates that he was feeling fine yesterday although he has been under a lot of stress. He felt a "vibration" starting in his abdomen which traveled to his knees. He is unclear whether or not he was hearing voices at the time. He relates that he frequently hears voices and sometimes sees things that are "not really there". Two months ago he was placed on seroquel through [facility]. It has not decreased the frequency of tehevoices.  When he felt the vibration beginning in his body, he "sank slowly to my knees" and then became very emotional. He remembers the entire event with clarity. There was no loss of bowel or bladder continence, no post ictal state. Reading ED notes it does not sound like his episodes of shaking in the ED were any more typical of seizure activity. He has no hx of seizure. No recent illnesses. Smoked some 'reefer" 2 days ago.  This episode consistent with psychiatric illness, not seizure. We discussed consulting neurology but I think a psychiatric consult more appropriate.

## 2011-08-09 NOTE — Progress Notes (Signed)
UR COMPLETED  

## 2011-08-09 NOTE — Progress Notes (Signed)
Pt arrived to floor. Patient's mother, Gavin Pound, at bedside. Pt very drowsy and not very interactive with RN (reported ativan given in ED). [name] was able to help with most of patient's admission history. Mother said that Dr. Alvester Morin had called her, and that she wished to speak with him. Family medicine paged, and Dr. Durene Cal said he would be down to speak with mother.
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
## 2011-08-09 NOTE — Progress Notes (Signed)
Pt more responsive and alert at this time. More able to communicate. When asked if he had any pain, pt stated "yes in my private area". Continued to c/o catheter and asked to have it removed. Foley removed at 330
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
## 2011-08-09 NOTE — Progress Notes (Signed)
Patient passed Swallow Screen per order, Regular diet placed.

## 2011-08-09 NOTE — Progress Notes (Signed)
Discussed in rounds.  Seen earlier today by attending, Dr. Jennette Kettle.  Agree with management as outlined by Dr. Durene Cal.

## 2011-08-10 DIAGNOSIS — F259 Schizoaffective disorder, unspecified: Secondary | ICD-10-CM | POA: Diagnosis present

## 2011-08-10 MED ORDER — QUETIAPINE FUMARATE ER 150 MG PO TB24: 150.0000 mg | ORAL_TABLET | Freq: Every day | ORAL | Status: DC

## 2011-08-10 NOTE — Discharge Instructions (Signed)
Please take your Seroquel everynight at the new dose of 150mg . Please make sure to go to your next appointment at [facility] and to establish with a regular doctor.

## 2011-08-10 NOTE — Discharge Summary (Signed)
Seen and examined.  Chart reviewed.  Agree with DC as per Dr. Durene Cal.  I did get one conflicting story.  We were led to believe that he was on Seroquel 50 mg daily.  He now tells me Bryan [name] had him on Seroquel 200 mg qam and 300 mg qpm.  Will try to clarify prior to DC.  Clearly, follow up with mental health is critical.  Still with scattered thoughts but no SI or HI

## 2011-08-10 NOTE — Consult Note (Signed)
Patient's notes and chart reviewed with Psych MD.  Recommendations remain for patient to follow up at [facility] with regards to mental health needs and also placed on DC summary information about Vocational Rehabilitation and Job skills with contact information to help patient declare more modified independence and motivation.  Patient has good support from mother who reports she helps with transportation, taking him to follow up appointments, and paying rent. Patient is working on Pharmacist, community and putting himself out in the community more.  Thus information about [facility] and Groups have also been provided for patient.   These groups are free for patient and work in a therapeutic environment  With regards to depression, ?Schiozoaffective disorder, and peer support.   If needs arise please contact.  No other needs at this time.  Ashley Jacobs, MSW LCSW [phone]

## 2011-08-10 NOTE — Discharge Summary (Signed)
Physician Discharge Summary  Patient ID: Bryan [name] MRN: [id number] DOB: [birth year] Age: 36 y.o.  Admit date: 08/08/2011 Discharge date: 08/10/2011  PCP: No primary provider on file. None  Consultants:Inpatient Psychiatry Dr. Ferol Luz   Discharge Diagnosis: Principal Problem:  *Schizoaffective disorder Active Problems:  Observed seizure-like activity    Hospital Course 36 y.o. year old male with schizoaffective disorder on low dose seroquel but no known history of seizure disorder presenting after possible seizure activity.   #1. Psychiatric/schizoaffective disorder- patient seen by inpatient psychiatry for jerking movements. Patient able to remember entire event where it felt like he had shaking "vibration" feelings starting his stomach that went out to his extremities. There was no loss of bowel or bladder continence, no post ictal state. This was associated with a high level of stress. These were thought to be due to a psychiatric disturbance. Patient has continued to hear voices on low dose seroquel.   -Psychiatry advised increasing seroquel to 150mg  XL nightly. This was started before discharge.   -Psychiatry advised Vyvanse 50mg  in AM for concern of ADD given patient scattered thoughts and difficulty concentrating. Will defer to [facility].   -patient deemed safe at home as no current SI/HI. He is able to be supervised by mother who is his guardian. Needs some extra help due tolearning disability and inability to read.    #2 Concern for seizure activity- Given that patient remembers all of the events and clearly was having a stressful period, highly doubt seizure activity at this time. my primary concern is for a psychiatric disturbance as does not appear patient had seizures.   -CT head negative.  -originally planned to consult neurology as well as MRI and EEG but patient remembers all events so not likely seizure activity, so these plans were cancelled.   -monitored on neuro  floor without subsequent events. Monitored patient for over 24 hours past injection of 8mg  total of Ativan to allow for effects/shakiness to pass. Patient was stable on feet by time of discharge.   -other studies: UDS negative except for marijuana which patient admits to, CMET wnl incl. Electrolytes and glucose, no trauma, ammonia not elevated, HIV nonreactive as well as RPR, WBC not elevated to indicate infectious source  #3. Tobacco abuse-low dose nicotine patch. Will need encouragement for cessation outpatient.    Discharge Exam: Temp:  [97.7 F (36.5 C)-99 F (37.2 C)] 97.7 F (36.5 C) (06/14 0158) Pulse Rate:  [82-98] 98  (06/14 0158) Resp:  [16-18] 16  (06/14 0158) BP: (120-135)/(67-89) 120/67 mmHg (06/14 0158) SpO2:  [95 %-97 %] 95 % (06/14 0158) Gen: NAD, witness patient walking around the room at least 3 times and appeared steady on his feet.  HEENT: moist mucous membranes, poor dentition  CV: Regular rate and rhythm, no murmurs rubs or gallops  PULM: clear to auscultation bilaterally. No wheezes/rales/rhonchi  ABD: soft/nontender/nondistended/normal bowel sounds  EXT: No edema  Neuro: Alert and oriented x3 when previously only oriented x2, CN II-XII intact, 5/5 muscle strength bilateral upper and lower extremities    Procedures/Imaging:  Ct Head Wo Contrast  08/08/2011  *RADIOLOGY REPORT*  Clinical Data: Mental status changes.  Jerking motions.  CT HEAD WITHOUT CONTRAST  Technique:  Contiguous axial images were obtained from the base of the skull through the vertex without contrast.  Comparison: None.  Findings: The brain has a normal appearance without evidence of malformation, atrophy, old or acute infarction, mass lesion, hemorrhage, hydrocephalus or extra-axial collection.  The calvarium is unremarkable.  Visualized sinuses are clear.  IMPRESSION: Normal examination.  Original Report Authenticated By: Thomasenia Sales, M.D.    Labs  CBC  Lab 08/08/11 2116 08/08/11 1617    WBC 6.3 --  HGB 14.8 14.3  HCT 43.0 42.0  PLT 286 --   BMET  Lab 08/08/11 2116 08/08/11 1617  NA 141 144  K 3.6 3.5  CL 104 106  CO2 27 --  BUN 14 13  CREATININE 1.01 1.00  CALCIUM 9.4 --  PROT 7.0 --  BILITOT 1.0 --  ALKPHOS 87 --  ALT 18 --  AST 20 --  GLUCOSE 98 99        Patient condition at time of discharge/disposition: stable  Disposition-home   Follow up issues: 1. Follow up to make sure compliant with seroquel. Follow up on voices.  2. Makes sure patient is following up with [facility]. Follow up on any more "shaking/vibration" episodes. See if [facility] has treated for ADD. Patient should have followed with [facility] by time he comes to office.  3. Encourage tobacco cessation.   Discharge follow up:   Follow-up Information    Follow up with Tana Conch, MD on 08/29/2011. (Arrive by 2:45 PM as you are a new patient for hospital follow up. Please bring $3 to appointment. )       Follow up with [facility]. (1-2 weeks as we have adjusted your medications. )             Discharge Medications  [place], [place]  [facility] Medication Instructions ZOX:[id number]   Printed on:08/10/11 0603  Medication Information                    QUEtiapine (SEROQUEL XR) 150 MG 24 hr tablet Take 1 tablet (150 mg total) by mouth at bedtime.                Tana Conch, MD of [facility] 08/10/2011 6:03 AM

## 2011-08-13 ENCOUNTER — Telehealth: Payer: Self-pay | Admitting: *Deleted

## 2011-08-13 NOTE — Telephone Encounter (Signed)
PA filled out and placed in to be faxed box. 

## 2011-08-13 NOTE — Telephone Encounter (Signed)
PA required for Quetiapine Fumarate. Form placed in Dr. Jonah Blue box.

## 2011-08-14 NOTE — Telephone Encounter (Signed)
Approval received and pharmacy notified. 

## 2011-08-29 ENCOUNTER — Inpatient Hospital Stay: Payer: Medicaid Other | Admitting: Family Medicine

## 2011-09-17 ENCOUNTER — Ambulatory Visit: Payer: Medicaid Other | Admitting: Family Medicine

## 2011-10-24 ENCOUNTER — Encounter (HOSPITAL_COMMUNITY): Payer: Self-pay | Admitting: Emergency Medicine

## 2011-10-24 ENCOUNTER — Emergency Department (HOSPITAL_COMMUNITY)
Admission: EM | Admit: 2011-10-24 | Discharge: 2011-10-27 | Disposition: A | Discharge status: Other Acute Inpatient Hospital | Payer: Medicaid Other | Attending: Emergency Medicine | Admitting: Emergency Medicine

## 2011-10-24 DIAGNOSIS — F259 Schizoaffective disorder, unspecified: Secondary | ICD-10-CM | POA: Insufficient documentation

## 2011-10-24 DIAGNOSIS — F17200 Nicotine dependence, unspecified, uncomplicated: Secondary | ICD-10-CM | POA: Insufficient documentation

## 2011-10-24 DIAGNOSIS — F22 Delusional disorders: Secondary | ICD-10-CM | POA: Insufficient documentation

## 2011-10-24 LAB — RAPID URINE DRUG SCREEN, HOSP PERFORMED
Cocaine: NOT DETECTED
Opiates: NOT DETECTED

## 2011-10-24 LAB — BASIC METABOLIC PANEL
Calcium: 9.7 mg/dL (ref 8.4–10.5)
Chloride: 99 mEq/L (ref 96–112)
Creatinine, Ser: 1.02 mg/dL (ref 0.50–1.35)
GFR calc Af Amer: >90 mL/min (ref >90)
Sodium: 136 mEq/L (ref 135–145)

## 2011-10-24 LAB — ACETAMINOPHEN LEVEL: Acetaminophen (Tylenol), Serum: <15 ug/mL (ref 10–30)

## 2011-10-24 LAB — CBC WITH DIFFERENTIAL/PLATELET
Basophils Absolute: 0 10*3/uL (ref 0.0–0.1)
Lymphs Abs: 3.3 10*3/uL (ref 0.7–4.0)
MCV: 89.3 fL (ref 78.0–100.0)
Monocytes Absolute: 0.6 10*3/uL (ref 0.1–1.0)
Monocytes Relative: 8 % (ref 3–12)
Neutrophils Relative %: 43 % (ref 43–77)
Platelets: 337 10*3/uL (ref 150–400)
RBC: 4.4 MIL/uL (ref 4.22–5.81)
RDW: 14.3 % (ref 11.5–15.5)
WBC: 7.3 10*3/uL (ref 4.0–10.5)

## 2011-10-24 MED ORDER — IBUPROFEN 200 MG PO TABS: 600.0000 mg | ORAL_TABLET | Freq: Three times a day (TID) | ORAL | Status: DC | PRN

## 2011-10-24 MED ORDER — LORAZEPAM 1 MG PO TABS
1.0000 mg | ORAL_TABLET | Freq: Three times a day (TID) | ORAL | Status: DC | PRN
  Administered 2011-10-26 (×2): 1 mg via ORAL
  Filled 2011-10-24 (×2): qty 1

## 2011-10-24 MED ORDER — NICOTINE 21 MG/24HR TD PT24
21.0000 mg | MEDICATED_PATCH | Freq: Every day | TRANSDERMAL | Status: DC
  Administered 2011-10-25 – 2011-10-26 (×2): 21 mg via TRANSDERMAL
  Filled 2011-10-24: qty 1

## 2011-10-24 MED ORDER — ZOLPIDEM TARTRATE 5 MG PO TABS
5.0000 mg | ORAL_TABLET | Freq: Every evening | ORAL | Status: DC | PRN
  Administered 2011-10-26 (×2): 5 mg via ORAL
  Filled 2011-10-24 (×2): qty 1

## 2011-10-24 MED ORDER — ONDANSETRON HCL 8 MG PO TABS: 4.0000 mg | ORAL_TABLET | Freq: Three times a day (TID) | ORAL | Status: DC | PRN

## 2011-10-24 MED ORDER — ALUM & MAG HYDROXIDE-SIMETH 200-200-20 MG/5ML PO SUSP: 30.0000 mL | ORAL | Status: DC | PRN

## 2011-10-24 NOTE — ED Notes (Signed)
Mom reports that pt has history of schizophrenia and has not been taking his medications. Mom reports doctors have placed him on "shots"  And pt was due last week but did not get it. Mom reports pt will not take pills. Pt has been wandering around for days. Mom request pt to be placed in a facility for help.

## 2011-10-24 NOTE — ED Notes (Signed)
[name] has gone home for the night. She took to belongings bags with her. Sitter at bedside. Environment safe. Pt sleeping respirations even and regular. Skin warm and dry. NAD.

## 2011-10-24 NOTE — ED Notes (Signed)
Environment safe. Sitter at bedside. Pt sleeping. Respirations regular and even. NAD.

## 2011-10-24 NOTE — ED Notes (Addendum)
Pt in paper scrubs laying down in bed. Eyes closed. Grunts and groans when asked questions. Non verbal. Unable to fully asses.m   Will not open his eyes. Respirations even and regular. NAD. Mother at bedside. Requested that she press the call light when pt awakens to further asses. Environment safe. Sitter at bedside.

## 2011-10-24 NOTE — ED Provider Notes (Signed)
History   This chart was scribed for Bryan Shi, MD by Sofie Rower. The patient was seen in room TR10C/TR10C and the patient's care was started at 4:12 PM     CSN: [id number]  Arrival date & time 10/24/11  1447   First MD Initiated Contact with Patient 10/24/11 1607      Chief Complaint  Patient presents with  . Medical Clearance    Patient is a 36 y.o. male presenting with altered mental status. The history is provided by the patient. No language interpreter was used.  Altered Mental Status This is a new problem. The current episode started 6 to 12 hours ago. The problem occurs daily. The problem has been gradually worsening. Pertinent negatives include no chest pain, no abdominal pain, no headaches and no shortness of breath. Nothing aggravates the symptoms. Nothing relieves the symptoms. He has tried nothing for the symptoms. The treatment provided no relief.    Bryan [name] is a 36 y.o. male , with a hx of Psychiatric problems, who presents to the Emergency Department complaining of sudden, progressively worsening, altered mental status onset today. The pt reports that he has a hx of schizophrenia which has been going on for years. The pt informs that he wanders around a lot outside, sitting and sleeping. The pt reports he feels as if he is being followed and hears voices of people who are not around. The pt has a prescription medication which he informs that he does not take.  Accompanied by mother who reports previous bouts with psychiatric problems.  Patient is apparently noncompliant with his medication.  The pt denies any hx of diabetes or any other significant medical problems.   The pt is a current everyday smoker and drinks alcohol on occasion. Denies other drugs of abuse.  Evasive with talking about SI/HI.     Past Medical History  Diagnosis Date  . Tremors of nervous system   . Psychiatric problem     Seen at [facility] for unknown reason. Takes seroquel. History  of hearing voices.  Not commandivng voices.     Past Surgical History  Procedure Date  . None     History reviewed. No pertinent family history.  History  Substance Use Topics  . Smoking status: Current Everyday Smoker -- 1.0 packs/day    Types: Cigarettes  . Smokeless tobacco: Not on file  . Alcohol Use: Yes     says drinks a 40 at least once a week. denies drink in 2-3 weeks.       Review of Systems  Respiratory: Negative for shortness of breath.   Cardiovascular: Negative for chest pain.  Gastrointestinal: Negative for abdominal pain.  Neurological: Negative for headaches.  Psychiatric/Behavioral: Positive for altered mental status.  All other systems reviewed and are negative.    Allergies  Review of patient's allergies indicates no known allergies.  Home Medications   Current Outpatient Rx  Name Route Sig Dispense Refill  . QUETIAPINE FUMARATE ER 150 MG PO TB24 Oral Take 150 mg by mouth at bedtime.      BP 115/67  Pulse 108  Temp 97.8 F (36.6 C) (Oral)  Resp 16  SpO2 97%  Physical Exam  Nursing note and vitals reviewed. Constitutional: He appears well-developed. No distress.  HENT:  Head: Normocephalic and atraumatic.  Eyes: Pupils are equal, round, and reactive to light.  Neck: Normal range of motion.  Cardiovascular: Normal rate and intact distal pulses.   Pulmonary/Chest: No respiratory distress.  Abdominal: Normal appearance. He exhibits no distension.  Musculoskeletal: Normal range of motion.  Neurological: He is alert. He displays normal reflexes. No cranial nerve deficit. He exhibits normal muscle tone.  Skin: Skin is warm and dry. No rash noted.  Psychiatric: His mood appears anxious. His speech is tangential. He is withdrawn and actively hallucinating. He expresses inappropriate judgment. He exhibits abnormal recent memory.    ED Course  Procedures (including critical care time)  DIAGNOSTIC STUDIES: Oxygen Saturation is 97% on room  air, normal by my interpretation.    COORDINATION OF CARE:    4:18PM- Treatment plan and placement in a more comfortable room discussed. Pt agrees to treatment.   Labs Reviewed  BASIC METABOLIC PANEL - Abnormal; Notable for the following:    Potassium 3.4 (*)     Glucose, Bld 156 (*)     All other components within normal limits  CBC WITH DIFFERENTIAL  URINE RAPID DRUG SCREEN (HOSP PERFORMED)  ETHANOL  ACETAMINOPHEN LEVEL   No results found.   1. Schizoaffective disorder   2. Paranoia       MDM         I personally performed the services described in this documentation, which was scribed in my presence. The recorded information has been reviewed and considered.    Bryan Shi, MD 10/25/11 [phone]

## 2011-10-24 NOTE — ED Notes (Signed)
Pt reports here for behavioral health placement. Pt reports wandering around and don't know why or how he got to his destination. Pt unable to give answer to if he is suicidal or homicidal. Pt avoids answering questions to suicide or homicide.

## 2011-10-24 NOTE — ED Notes (Signed)
RN paged security

## 2011-10-24 NOTE — ED Notes (Signed)
First meeting with patient. Patient presents with flat affect and does not like answering questions. Patients mother at bedside.  Patient in blue paper scrubs and his mother will take his belongings home with her when she leaves. Dinner tray ordered and Comptroller at bedside.

## 2011-10-25 NOTE — ED Notes (Signed)
Pt sleeping. Non verbal when trying to awaken. Will not answer questions. Environment safe. Sitter at bedside. Respirations even and regular. NAD.

## 2011-10-25 NOTE — ED Notes (Signed)
Patient has returned to their room with their sitter.

## 2011-10-25 NOTE — BH Assessment (Addendum)
Assessment Note Addendum:  Spoke with patient's grandmother who requested she and patient's mother be called if pt is moved.    Bryan [name] is an 36 y.o. male that was assessed this day.  Previous clinician tried to assess pt, but he has not been responding to staff until recently.  Pt presented to [facility] requesting help.  Pt reported he was not sure where he was or how he got here.  Per pt's mother, he has a diagnosis of MDD with psychotic features or Schizophrenia and is off of his medication.  Per mother, pt was supposed to get his shop last week from [place], but did not get it.  When pt asked, he stated he has not taken his Seroquel in a month because he did not feel it was helping with the voices.  Pt was very disorganized in his speech and thought processes, but was pleasant, calm and cooperative.  Pt reported that he knows he is in the hospital for help.  He requested help because he has been wandering around.  Pt stated he is "exhaused" from doing this and feels like he does it to "get away from myself and the voices."  Pt reports he is a "different man" when he wanders off and that this makes him tired and makes him feel like "I am going to have a nervous breakdown."  Pt refers to this wandering as "bugging out like a mad man."  Pt stated he used to use marijuana and he feels this has affected the blood circulation in his brain as well as affected his memory.  Pt reported he "blanks out" and loses time.  Pt reported he hears voices, but that they are constant and do not command him to do things.  Pt also endorses visual hallucinations,and reports he sees people, "but I can't communicate with them, and that's not right."  Pt endorses depressive symptoms.  However, pt denies SI, HI or psychosis and stated he has not ever tried to hurt himself or anyone else.  Pt was seen by LCSW Hannah Nail in June of this year and referred to [facility] as well as given other resources.  Mother feels pt needs help and to  get back on his medications.  Mother is pt's greatest support.  Pt feels he is being "tormented" and that others are talking around him.  Pt stated he needs help and would like to get stabilized and on a medication that will help him.  Called [facility] and beds available.  Faxed referral for review.  Completed assessment and assessment notification that was also faxed to [facility] to log.  Axis I: Schizoaffective Disorder Axis II: Deferred Axis III:  Past Medical History  Diagnosis Date  . Tremors of nervous system   . Psychiatric problem     Seen at [facility] for unknown reason. Takes seroquel. History of hearing voices.  Not commandivng voices.    Axis IV: other psychosocial or environmental problems Axis V: 11-20 some danger of hurting self or others possible OR occasionally fails to maintain minimal personal hygiene OR gross impairment in communication  Past Medical History:  Past Medical History  Diagnosis Date  . Tremors of nervous system   . Psychiatric problem     Seen at [facility] for unknown reason. Takes seroquel. History of hearing voices.  Not commandivng voices.     Past Surgical History  Procedure Date  . None     Family History: History reviewed. No pertinent family history.  Social  History:  reports that he has been smoking Cigarettes.  He has been smoking about 1 pack per day. He does not have any smokeless tobacco history on file. He reports that he drinks alcohol. He reports that he uses illicit drugs.  Additional Social History:  Alcohol / Drug Use Pain Medications: pt denies Prescriptions: pt denies Over the Counter: pt denies History of alcohol / drug use?:  (pt reports history, no specifics, denies current use) Longest period of sobriety (when/how long): unknown Negative Consequences of Use:  (pt denies) Withdrawal Symptoms:  (pt denies)  CIWA: CIWA-Ar BP: 132/80 mmHg Pulse Rate: 84  COWS:    Allergies: No Known Allergies  Home Medications:  (Not in a hospital  admission)  OB/GYN Status:  No LMP for male patient.  General Assessment Data Location of Assessment: [facility] ED Living Arrangements: Alone Can pt return to current living arrangement?: Yes Admission Status: Voluntary Is patient capable of signing voluntary admission?: Yes Transfer from: Acute Hospital Referral Source: Self/Family/Friend  Education Status Is patient currently in school?: No  Risk to self Suicidal Ideation: No-Not Currently/Within Last 6 Months Suicidal Intent: No-Not Currently/Within Last 6 Months Is patient at risk for suicide?: No Suicidal Plan?: No-Not Currently/Within Last 6 Months Access to Means: No What has been your use of drugs/alcohol within the last 12 months?: pt stated he has used marijuana in past, no specifics given, denies current use Previous Attempts/Gestures: No How many times?:  (pt denies) Other Self Harm Risks: wandering out alone Triggers for Past Attempts: Unknown Intentional Self Injurious Behavior: None (pt denies) Family Suicide History: Unknown Recent stressful life event(s): Other (Comment) (pt off meds, has been wandering around alone) Persecutory voices/beliefs?: No Depression: Yes Depression Symptoms: Despondent;Insomnia;Tearfulness;Isolating;Fatigue;Guilt;Loss of interest in usual pleasures;Feeling worthless/self pity Substance abuse history and/or treatment for substance abuse?: No Suicide prevention information given to non-admitted patients: Not applicable  Risk to Others Homicidal Ideation: No-Not Currently/Within Last 6 Months Thoughts of Harm to Others: No-Not Currently Present/Within Last 6 Months Current Homicidal Intent: No-Not Currently/Within Last 6 Months Current Homicidal Plan: No-Not Currently/Within Last 6 Months Access to Homicidal Means: No Identified Victim: pt denies History of harm to others?: No Assessment of Violence: None Noted Violent Behavior Description: na - pt calm, cooperative Does patient have  access to weapons?: No Criminal Charges Pending?: No Does patient have a court date: No  Psychosis Hallucinations: Auditory;Visual Delusions:  (Paranoia)  Mental Status Report Appear/Hygiene: Other (Comment) (casual) Eye Contact: Good Motor Activity: Unremarkable Speech: Logical/coherent;Soft;Slow Level of Consciousness: Alert Mood: Depressed;Ashamed/humiliated Affect: Depressed;Inconsistent with thought content Anxiety Level: Moderate Thought Processes: Coherent;Relevant Judgement: Impaired Orientation: Person;Place;Situation Obsessive Compulsive Thoughts/Behaviors: None  Cognitive Functioning Concentration: Decreased Memory: Recent Impaired;Remote Impaired IQ: Average Insight: Poor Impulse Control: Fair Appetite: Fair Weight Loss: 0  Weight Gain: 0  Sleep: Decreased Total Hours of Sleep:  (5-6 hrs total, but wakes during night) Vegetative Symptoms: None  ADLScreening [facility] Assessment Services) Patient's cognitive ability adequate to safely complete daily activities?: Yes Patient able to express need for assistance with ADLs?: Yes Independently performs ADLs?: Yes (appropriate for developmental age)  Abuse/Neglect [facility]) Physical Abuse: Denies Verbal Abuse: Denies Sexual Abuse: Denies  Prior Inpatient Therapy Prior Inpatient Therapy: No Prior Therapy Dates: na Prior Therapy Facilty/Provider(s): na Reason for Treatment: na  Prior Outpatient Therapy Prior Outpatient Therapy: Yes Prior Therapy Dates: current Prior Therapy Facilty/Provider(s): [facility] Reason for Treatment: MDD with psychotic features  ADL Screening (condition at time of admission) Patient's cognitive ability adequate to safely  complete daily activities?: Yes Patient able to express need for assistance with ADLs?: Yes Independently performs ADLs?: Yes (appropriate for developmental age) Weakness of Legs: None Weakness of Arms/Hands: None  Home Assistive Devices/Equipment Home Assistive  Devices/Equipment: None    Abuse/Neglect Assessment (Assessment to be complete while patient is alone) Physical Abuse: Denies Verbal Abuse: Denies Sexual Abuse: Denies Exploitation of patient/patient's resources: Denies Self-Neglect: Denies Values / Beliefs Cultural Requests During Hospitalization: None Spiritual Requests During Hospitalization: None Consults Spiritual Care Consult Needed: No Social Work Consult Needed: No Merchant navy officer (For Healthcare) Advance Directive: Patient does not have advance directive;Patient would not like information    Additional Information 1:1 In Past 12 Months?: No CIRT Risk: No Elopement Risk: No Does patient have medical clearance?: Yes     Disposition:  Disposition Disposition of Patient: Referred to;Inpatient treatment program Type of inpatient treatment program: Adult Patient referred to: Other (Comment) (Pending [facility])  On Site Evaluation by:   Reviewed with Physician:  Nehemiah Settle 10/25/2011 6:25 PM

## 2011-10-25 NOTE — ED Notes (Signed)
Patient given warm ham tray. Does not want ham requested chicken or fish. Secretary ordering food for patient.

## 2011-10-25 NOTE — ED Notes (Signed)
Patient is in shower at this time, sitter is remaining with patient at doorway.

## 2011-10-25 NOTE — ED Notes (Signed)
Act team at bedside 

## 2011-10-25 NOTE — BH Assessment (Signed)
[facility] Assessment Progress Note      Pt was not referred to ACT until 0530.  Attempted to assess patient and he was initially only responsive with mumbling, but when asked to open his eyes and look at writer, he stated, I'm not going to be able to answer your questions because I'm too tired.  Will you please come back?  Pt referred to incoming clinician for evaluation later this morning.

## 2011-10-25 NOTE — ED Notes (Signed)
Patient resting comfortably on stretcher with sitter at bedside.  Answering and following commands appropriate.

## 2011-10-26 MED ORDER — BUPROPION HCL ER (XL) 300 MG PO TB24
300.0000 mg | ORAL_TABLET | Freq: Every day | ORAL | Status: DC
  Administered 2011-10-26: 300 mg via ORAL
  Filled 2011-10-26: qty 1

## 2011-10-26 MED ORDER — QUETIAPINE FUMARATE ER 50 MG PO TB24
150.0000 mg | ORAL_TABLET | Freq: Every day | ORAL | Status: DC
  Administered 2011-10-26: 150 mg via ORAL
  Filled 2011-10-26 (×2): qty 3

## 2011-10-26 MED ORDER — RISPERIDONE 3 MG PO TABS
3.0000 mg | ORAL_TABLET | Freq: Two times a day (BID) | ORAL | Status: DC
  Administered 2011-10-26: 3 mg via ORAL
  Filled 2011-10-26 (×4): qty 1

## 2011-10-26 MED ORDER — HALOPERIDOL 2 MG PO TABS
3.0000 mg | ORAL_TABLET | Freq: Two times a day (BID) | ORAL | Status: DC
  Filled 2011-10-26 (×2): qty 1

## 2011-10-26 MED ORDER — ARIPIPRAZOLE 5 MG PO TABS: 5.0000 mg | ORAL_TABLET | Freq: Two times a day (BID) | ORAL | Status: DC

## 2011-10-26 MED ORDER — BENZTROPINE MESYLATE 0.5 MG PO TABS
0.5000 mg | ORAL_TABLET | Freq: Two times a day (BID) | ORAL | Status: DC
  Administered 2011-10-26 (×2): 0.5 mg via ORAL
  Filled 2011-10-26 (×3): qty 1

## 2011-10-26 MED ORDER — RISPERIDONE 0.5 MG PO TABS
3.0000 mg | ORAL_TABLET | Freq: Two times a day (BID) | ORAL | Status: DC
  Filled 2011-10-26: qty 6
  Filled 2011-10-26: qty 1
  Filled 2011-10-26: qty 6

## 2011-10-26 NOTE — ED Notes (Signed)
Called pharmacy to advised that I have not received the Risperdal. Pharmacy stated that it was sent to [facility]/ Chked all stations none of the stations had the risperdal for the patient. Resend request through pharmacy

## 2011-10-26 NOTE — BH Assessment (Signed)
[facility] Assessment Progress Note      Pt accepted to [facility] to room 406-2, [place].  Pt signed support paperwork, ED staff notified.  Pt will be transported by security.

## 2011-10-26 NOTE — ED Notes (Signed)
Pts mother called to provided her number if she is needed Bryan [name] [phone]

## 2011-10-26 NOTE — ED Notes (Signed)
Upon transfer to [facility] please call [name] at [phone].

## 2011-10-26 NOTE — ED Notes (Signed)
Pt speaking with telepsych at this time.

## 2011-10-26 NOTE — ED Notes (Signed)
Pt sleeping with sitter in room.  Did not wake to reassess vitals at this time

## 2011-10-26 NOTE — ED Provider Notes (Addendum)
The patient is verbal, but states he doesn't want to talk to me. Vital signs are normal and he is reportedly been cooperative. He has specified certain diet items. He is under review for placement at [facility].  Flint Melter, MD 10/26/11 [phone]   Patient's mother brought prescriptions from when he was discharged from [facility]. On 10/05/11; they were for Risperdal, Cogentin, and Wellbutrin; these were added to his treatment.  Flint Melter, MD 10/26/11 1012  He apparently also takes Seroquel 150 mg daily. We'll initiate that.    Flint Melter, MD 10/26/11 [phone]

## 2011-10-26 NOTE — BH Assessment (Signed)
Assessment Note   Bryan [name] is an 36 y.o. male that is being reassessed for inpatient treatment of psychosis and delusional thinking.  Pt was hospitalized at [facility] last month for same and was placed on several medications that he is not taking (Seroquel, Risperdal, Cogentin) per his mother.  For continuity of care, medical records at [facility] was contacted to receive verification of scripts per Mancel Bale.  Pt continues to present as guarded and paranoid, with vague recollection of his past few weeks and impaired judgement and concentration.  Pt has a lengthy history of same and has been living on the streets and remained non-compliant for years.  It is questionable whether pt is current able to contract for safety and may benefit from inpatient psychiatric care to treat current symptoms.  A Telepsych consultation was recommendation to determine current course of treatment per [facility] for potential admission.  Disposition tbd.  Bryan [name] is an 36 y.o. male that was assessed this day. Previous clinician tried to assess pt, but he has not been responding to staff until recently. Pt presented to [facility] requesting help. Pt reported he was not sure where he was or how he got here. Per pt's mother, he has a diagnosis of MDD with psychotic features or Schizophrenia and is off of his medication. Per mother, pt was supposed to get his shop last week from [place], but did not get it. When pt asked, he stated he has not taken his Seroquel in a month because he did not feel it was helping with the voices. Pt was very disorganized in his speech and thought processes, but was pleasant, calm and cooperative. Pt reported that he knows he is in the hospital for help. He requested help because he has been wandering around. Pt stated he is "exhaused" from doing this and feels like he does it to "get away from myself and the voices." Pt reports he is a "different man" when he wanders off and that this makes him tired and makes  him feel like "I am going to have a nervous breakdown." Pt refers to this wandering as "bugging out like a mad man." Pt stated he used to use marijuana and he feels this has affected the blood circulation in his brain as well as affected his memory. Pt reported he "blanks out" and loses time. Pt reported he hears voices, but that they are constant and do not command him to do things. Pt also endorses visual hallucinations,and reports he sees people, "but I can't communicate with them, and that's not right." Pt endorses depressive symptoms. However, pt denies SI, HI or psychosis and stated he has not ever tried to hurt himself or anyone else. Pt was seen by LCSW Hannah Nail in June of this year and referred to [facility] as well as given other resources. Mother feels pt needs help and to get back on his medications. Mother is pt's greatest support. Pt feels he is being "tormented" and that others are talking around him. Pt stated he needs help and would like to get stabilized and on a medication that will help him. Called [facility] and beds available. Faxed referral for review. Completed assessment and assessment notification that was also faxed to [facility] to log.  Axis I: Schizoaffective Disorder  Axis I: Schizophrenia, Paranoid Type Axis II: Deferred Axis III:  Past Medical History  Diagnosis Date  . Tremors of nervous system   . Psychiatric problem     Seen at [facility] for unknown reason.  Takes seroquel. History of hearing voices.  Not commandivng voices.    Axis IV: housing problems, other psychosocial or environmental problems, problems related to social environment, problems with access to health care services and problems with primary support group Axis V: 21-30 behavior considerably influenced by delusions or hallucinations OR serious impairment in judgment, communication OR inability to function in almost all areas  Past Medical History:  Past Medical History  Diagnosis Date  . Tremors of nervous system    . Psychiatric problem     Seen at [facility] for unknown reason. Takes seroquel. History of hearing voices.  Not commandivng voices.     Past Surgical History  Procedure Date  . None     Family History: History reviewed. No pertinent family history.  Social History:  reports that he has been smoking Cigarettes.  He has been smoking about 1 pack per day. He does not have any smokeless tobacco history on file. He reports that he drinks alcohol. He reports that he uses illicit drugs.  Additional Social History:  Alcohol / Drug Use Pain Medications: pt denies Prescriptions: pt denies Over the Counter: pt denies History of alcohol / drug use?:  (pt reports history, no specifics, denies current use) Longest period of sobriety (when/how long): unknown Negative Consequences of Use:  (pt denies) Withdrawal Symptoms:  (pt denies)  CIWA: CIWA-Ar BP: 112/61 mmHg Pulse Rate: 80  COWS:    Allergies: No Known Allergies  Home Medications:  (Not in a hospital admission)  OB/GYN Status:  No LMP for male patient.  General Assessment Data Location of Assessment: [facility] ED Living Arrangements: Other (Comment) (Pt is homeless and lives on the streets) Can pt return to current living arrangement?: Yes Admission Status: Voluntary Is patient capable of signing voluntary admission?: Yes Transfer from: Acute Hospital Referral Source: Self/Family/Friend  Education Status Is patient currently in school?: No  Risk to self Suicidal Ideation: No Suicidal Intent: No Is patient at risk for suicide?: No Suicidal Plan?: No Access to Means: No What has been your use of drugs/alcohol within the last 12 months?: Denies current use of any illicit drugs Previous Attempts/Gestures: No How many times?: 0  Other Self Harm Risks: unpredicatable and wandering the streets; disappears Triggers for Past Attempts: Unpredictable;Hallucinations Intentional Self Injurious Behavior: Damaging (Refuses to stay off the  streets; feels more comfortable ther) Family Suicide History: No Recent stressful life event(s): Turmoil (Comment);Conflict (Comment) Persecutory voices/beliefs?: Yes Depression: Yes Depression Symptoms: Feeling worthless/self pity;Loss of interest in usual pleasures;Feeling angry/irritable;Isolating Substance abuse history and/or treatment for substance abuse?: Yes Suicide prevention information given to non-admitted patients: Yes  Risk to Others Homicidal Ideation: No Thoughts of Harm to Others: No Current Homicidal Intent: No Current Homicidal Plan: No Access to Homicidal Means: No Identified Victim: none per pt History of harm to others?: No Assessment of Violence: None Noted Violent Behavior Description: pt is resting quietly Does patient have access to weapons?: No Criminal Charges Pending?: No Does patient have a court date: No  Psychosis Hallucinations: Auditory;Visual Delusions: Unspecified  Mental Status Report Appear/Hygiene: Other (Comment) (comfortable in scrubs) Eye Contact: Fair Motor Activity: Agitation Speech: Soft Level of Consciousness: Quiet/awake Mood: Ambivalent;Apprehensive;Helpless;Sullen Affect: Depressed;Inconsistent with thought content Anxiety Level: Moderate Thought Processes: Relevant Judgement: Impaired Orientation: Person;Place;Situation Obsessive Compulsive Thoughts/Behaviors: Moderate  Cognitive Functioning Concentration: Decreased Memory: Recent Impaired;Remote Impaired IQ: Average Insight: Poor Impulse Control: Poor Appetite: Fair Weight Loss: 0  Weight Gain: 0  Sleep: Decreased Total Hours of Sleep:  (vascillates; sleeps  on the streets) Vegetative Symptoms: None  ADLScreening [facility] Assessment Services) Patient's cognitive ability adequate to safely complete daily activities?: Yes Patient able to express need for assistance with ADLs?: Yes Independently performs ADLs?: Yes (appropriate for developmental age)  Abuse/Neglect  [facility]) Physical Abuse: Denies Verbal Abuse: Denies Sexual Abuse: Denies  Prior Inpatient Therapy Prior Inpatient Therapy: Yes Prior Therapy Dates: 08/2011 Prior Therapy Facilty/Provider(s): [facility] Reason for Treatment: psychosis and delusional thinking  Prior Outpatient Therapy Prior Outpatient Therapy: Yes Prior Therapy Dates: currently Prior Therapy Facilty/Provider(s): [facility] Reason for Treatment: MDD with psychotic features  ADL Screening (condition at time of admission) Patient's cognitive ability adequate to safely complete daily activities?: Yes Patient able to express need for assistance with ADLs?: Yes Independently performs ADLs?: Yes (appropriate for developmental age) Weakness of Legs: None Weakness of Arms/Hands: None  Home Assistive Devices/Equipment Home Assistive Devices/Equipment: None    Abuse/Neglect Assessment (Assessment to be complete while patient is alone) Physical Abuse: Denies Verbal Abuse: Denies Sexual Abuse: Denies Exploitation of patient/patient's resources: Denies Self-Neglect: Denies Values / Beliefs Cultural Requests During Hospitalization: None Spiritual Requests During Hospitalization: None Consults Spiritual Care Consult Needed: No Social Work Consult Needed: No Merchant navy officer (For Healthcare) Advance Directive: Patient does not have advance directive;Patient would not like information    Additional Information 1:1 In Past 12 Months?: No CIRT Risk: No Elopement Risk: No Does patient have medical clearance?: Yes     Disposition: A Telepsych has been recommended by [facility] to determine if pt is appropriate for placement at their facility.  Awaiting results. Disposition Disposition of Patient: Inpatient treatment program;Referred to Type of inpatient treatment program: Adult Patient referred to: Other (Comment) (Pending [facility])  On Site Evaluation by:   Reviewed with Physician:     Angelica Ran 10/26/2011 1:53 PM

## 2011-10-26 NOTE — ED Provider Notes (Addendum)
1934.  telepsych eval performed.  Pt noted to be "grossly psychotic".  inpt placement recommended.  Will start abilify per psych recommendations.  Will contact ACT Team to work towards placement.  Tobin Chad, MD 10/26/11 [id number]  1191.  Pt has been accepted by behavioral health.  Plan transfer.  Tobin Chad, MD 10/26/11 [phone]

## 2011-10-27 ENCOUNTER — Inpatient Hospital Stay (HOSPITAL_COMMUNITY)
Admission: AD | Admit: 2011-10-27 | Discharge: 2011-11-06 | DRG: 885 | Disposition: A | Discharge status: Home / Self Care | Payer: Medicaid Other | Source: Ambulatory Visit | Attending: Psychiatry | Admitting: Psychiatry

## 2011-10-27 ENCOUNTER — Encounter (HOSPITAL_COMMUNITY): Payer: Self-pay | Admitting: *Deleted

## 2011-10-27 DIAGNOSIS — I10 Essential (primary) hypertension: Secondary | ICD-10-CM | POA: Diagnosis present

## 2011-10-27 DIAGNOSIS — F259 Schizoaffective disorder, unspecified: Principal | ICD-10-CM | POA: Diagnosis present

## 2011-10-27 DIAGNOSIS — Z550 Illiteracy and low-level literacy: Secondary | ICD-10-CM

## 2011-10-27 DIAGNOSIS — R569 Unspecified convulsions: Secondary | ICD-10-CM

## 2011-10-27 DIAGNOSIS — R259 Unspecified abnormal involuntary movements: Secondary | ICD-10-CM | POA: Diagnosis present

## 2011-10-27 DIAGNOSIS — M2550 Pain in unspecified joint: Secondary | ICD-10-CM | POA: Diagnosis present

## 2011-10-27 DIAGNOSIS — R5381 Other malaise: Secondary | ICD-10-CM | POA: Diagnosis present

## 2011-10-27 DIAGNOSIS — F251 Schizoaffective disorder, depressive type: Secondary | ICD-10-CM

## 2011-10-27 DIAGNOSIS — R0602 Shortness of breath: Secondary | ICD-10-CM | POA: Diagnosis present

## 2011-10-27 HISTORY — DX: Essential (primary) hypertension: I10

## 2011-10-27 MED ORDER — ACETAMINOPHEN 325 MG PO TABS: 650.0000 mg | ORAL_TABLET | Freq: Four times a day (QID) | ORAL | Status: DC | PRN

## 2011-10-27 MED ORDER — MAGNESIUM HYDROXIDE 400 MG/5ML PO SUSP: 30.0000 mL | Freq: Every day | ORAL | Status: DC | PRN

## 2011-10-27 MED ORDER — TRAZODONE HCL 100 MG PO TABS
100.0000 mg | ORAL_TABLET | Freq: Every evening | ORAL | Status: DC | PRN
  Administered 2011-10-28 – 2011-10-29 (×2): 100 mg via ORAL
  Filled 2011-10-27 (×3): qty 1
  Filled 2011-10-27: qty 14

## 2011-10-27 MED ORDER — HALOPERIDOL 5 MG PO TABS
5.0000 mg | ORAL_TABLET | Freq: Every day | ORAL | Status: DC
  Administered 2011-10-27 – 2011-10-28 (×2): 5 mg via ORAL
  Filled 2011-10-27 (×5): qty 1

## 2011-10-27 MED ORDER — ALUM & MAG HYDROXIDE-SIMETH 200-200-20 MG/5ML PO SUSP: 30.0000 mL | ORAL | Status: DC | PRN

## 2011-10-27 MED ORDER — BENZTROPINE MESYLATE 1 MG PO TABS
1.0000 mg | ORAL_TABLET | Freq: Every day | ORAL | Status: DC
  Administered 2011-10-27 – 2011-11-05 (×10): 1 mg via ORAL
  Filled 2011-10-27 (×13): qty 1

## 2011-10-27 MED ORDER — CITALOPRAM HYDROBROMIDE 20 MG PO TABS
20.0000 mg | ORAL_TABLET | Freq: Every day | ORAL | Status: DC
  Administered 2011-10-27 – 2011-10-29 (×3): 20 mg via ORAL
  Filled 2011-10-27 (×6): qty 1

## 2011-10-27 NOTE — Progress Notes (Signed)
Psychoeducational Group Note  Date:  10/27/2011 Time:  1515  Group Topic/Focus:  Healthy Communication:   The focus of this group is to discuss communication, barriers to communication, as well as healthy ways to communicate with others.  Participation Level:  Did Not Attend  Participation Quality:    Affect:    Cognitive:    Insight:    Engagement in Group:    Additional Comments:  None   Marquis Lunch, Chriselda Leppert 10/27/2011, 7:04 PM

## 2011-10-27 NOTE — Tx Team (Signed)
Initial Interdisciplinary Treatment Plan  PATIENT STRENGTHS: (choose at least two) General fund of knowledge Physical Health  PATIENT STRESSORS: Medication change or noncompliance   PROBLEM LIST: Problem List/Patient Goals Date to be addressed Date deferred Reason deferred Estimated date of resolution  Auditory hallucinations 10/27/11     depression 10/27/11                                                DISCHARGE CRITERIA:  Ability to meet basic life and health needs Adequate post-discharge living arrangements Improved stabilization in mood, thinking, and/or behavior Verbal commitment to aftercare and medication compliance  PRELIMINARY DISCHARGE PLAN: Outpatient therapy Placement in alternative living arrangements  PATIENT/FAMIILY INVOLVEMENT: This treatment plan has been presented to and reviewed with the patient, Bryan [name], [place] 10/27/2011, 3:51 AM

## 2011-10-27 NOTE — Progress Notes (Signed)
[facility] Group Notes:  (Counselor/Nursing/MHT/Case Management/Adjunct)  10/27/2011  12:30 PM  Type of Therapy:  Discharge Planning/Group Therapy  Participation Level:  Did Not Attend    Christen Butter 10/27/2011, 12:30  PM

## 2011-10-27 NOTE — [facility]HH Suicide Risk Assessment (Signed)
Suicide Risk Assessment  Admission Assessment     Demographic factors:  Assessment Details Time of Assessment: Admission Information Obtained From: Patient Current Mental Status:    Loss Factors:  Loss Factors: Loss of significant relationship;Financial problems / change in socioeconomic status Historical Factors:  Historical Factors: Family history of mental illness or substance abuse Risk Reduction Factors:     CLINICAL FACTORS:   Depression:   Anhedonia Hopelessness Impulsivity Previous Psychiatric Diagnoses and Treatments Schizoaffective Disorder - Depressed Type.  COGNITIVE FEATURES THAT CONTRIBUTE TO RISK:  None Noted.    Diagnosis:  Axis I: Schizoaffective Disorder - Depressive Type.   The patient was seen today and reports the following:   ADL's: Intact.  Sleep: The patient reports to having difficulty initiating and maintaining sleep.  Appetite: The patient reports a good appetite today.   Mild>(1-10) >Severe  Hopelessness (1-10): 5  Depression (1-10): 5-6 Anxiety (1-10): 5   Suicidal Ideation: The patient denies any current suicidal ideations today but reported suicidal ideation prior to admission.  Plan: No  Intent: No  Means: No   Homicidal Ideation: The patient denies any homicidal ideations today.  Plan: No  Intent: No.  Means: No   General Appearance/Behavior: The patient was cooperative today with this provider but appeared significantly depressed.  Eye Contact: Good.  Speech: Appropriate in rate and volume with no pressuring of speech noted today.  Motor Behavior: wnl.  Level of Consciousness: Alert and Oriented x 3.  Mental Status: Alert and Oriented x 3.  Mood: Moderately Depressed.  Affect: Moderately Constricted.  Anxiety Level: Moderate anxiety reported today.  Thought Process: Auditory hallucinations reported.  Thought Content: The patient reports to hearing the voices which are saying negative things to him. He denies any visual  hallucinations or delusions thinking. Perception:. Auditory hallucinations reported.  Judgment: Good.  Insight: Good.  Cognition: Oriented to person, place and time.   Current Medications:     . benztropine  1 mg Oral QHS  . citalopram  20 mg Oral Daily  . haloperidol  5 mg Oral QHS   Review of Systems:  Neurological: No headaches, seizures or dizziness reported.  G.I.: The patient denies any constipation or stomach upset today.  Musculoskeletal: The patient denies any musculoskeletal issues today.   Time was spent today discussing with the patient his current symptoms. The patient reports to having difficulty initiating and maintaining sleep but reports a good appetite. He reports moderate feelings of sadness, anhedonia and depressed mood as well as moderate anxiety today. He denies any current suicidal or homicidal ideations today but reports having suicidal ideations prior to admission.  He reports severe auditory hallucinations which he describes as loud voices "yelling at me."  He denies any visual hallucinations today or delusional thinking. The patient denies any history or substance abuse and is being admitted for evaluation and treatment of the above symptoms.   Treatment Plan Summary:  1. Daily contact with patient to assess and evaluate symptoms and progress in treatment.  2. Medication management  3. The patient will deny suicidal ideations or homicidal ideations for 48 hours prior to discharge and have a depression and anxiety rating of 3 or less. The patient will also deny any auditory or visual hallucinations or delusional thinking.  4. The patient will deny any symptoms of substance withdrawal at time of discharge.   Plan:  1. Will start the patient on the medication Haldol at 5 mgs po qhs for psychosis.  2. Will start the  patient on the medication Cogentin at 1 mg po qhs for EPS.  3. Will start the patient on the medication Celexa at 20 mgs po q am for depression and  anxiety. 4. Will order Trazodone at 100 mgs po qhs - prn for sleep. 5. Laboratory Studies reviewed. 6. Will order a TSH, Free T3, Free T4 and repeat CMP.  7. Will continue to monitor.   SUICIDE RISK:   Mild:  Suicidal ideation of limited frequency, intensity, duration, and specificity.  There are no identifiable plans, no associated intent, mild dysphoria and related symptoms, good self-control (both objective and subjective assessment), few other risk factors, and identifiable protective factors, including available and accessible social support.  RANDY READLING 10/27/2011, 7:42 PM

## 2011-10-27 NOTE — Progress Notes (Deleted)
[facility] Group Notes:  (Counselor/Nursing/MHT/Case Management/Adjunct)  10/27/2011  12:30 PM  Type of Therapy:  Discharge Planning/Group Therapy  Participation Level:  Minimal  Participation Quality:  Attentive and Sharing  Affect:  Blunted  Cognitive:  Delusional  Insight:  Poor  Engagement in Group:  Limited  Engagement in Therapy:  Limited  Modes of Intervention:  Clarification, Education, Limit-setting, Problem-solving and Support  Summary of Progress/Problems: Pt. attended and participated in aftercare planning and group therapy.  Pt. accepted information on suicide prevention, warning signs to look for with suicide and crisis line numbers to use. The pt. agreed to call crisis line numbers if having warning signs or having thoughts of suicide. Therapist prompted Pts to honestly and openly disclose work on pertinent issues.  Pt. Denied being suicidal,depressed or anxious.  Pt explained that he had been in treatment before.  He reported there were many conflicts and he had been expelled from [facility].  He reported that [facility], his ACT team, were making plans for him to go to [facility]. Pt stated there were evil spirits everywhere and he was here to make them go away since he had special powers.      Christen Butter 10/27/2011, 12:30  PM

## 2011-10-27 NOTE — Progress Notes (Signed)
Patient ID: Bryan [name], male   DOB: 1975-03-03, 36 y.o.   MRN: [id number] This is a 36 y.o. S/AA/M vol. admission with a Dx of Schizophrenia,Paranoid Type. The patient walked into [facility] looking for help. Stated he had been wandering around trying to get relief from the voices he hears. States he constantly hears voices and they make him talk to himself. His thoughts and speech are disorganized. He is a poor historian and it is difficult to obtain history from him. He states he is homeless and that relatives provide him with money so he can occasionally stay at a motel. The patient is unable to read, stating the highest level of education he completed was third grade. The patient has had prior psychiatric admissions but has been non compliant with medication. Recently was prescribed Seroquel, but stopped taking it a month ago because he said it doesn't help. Denied any suicidal/homicidal ideation. Denied any substance abuse.

## 2011-10-27 NOTE — H&P (Signed)
Psychiatric Admission Assessment Adult  Patient Identification:  Bryan [name] Date of Evaluation:  10/27/2011 Chief Complaint:  Schizoaffective Disorder History of Present Illness:  The patient presented to the [facility] ED requesting help with his mental illness. He states he has been having worsening symptoms which have been going on for years. He states that he wanders around a lot outside and walks until he is worn out. He often ends up in places he has no business being in.  He often wonders how he got where he is.  He has a long standing history of schizophrenia for which he has prescription medication, but he doesn't take it.  Past Psychiatric History: Diagnosis:  Paranoid schizophrenia  Hospitalizations:  Outpatient Care:  [facility]  Substance Abuse Care:  Self-Mutilation:  Suicidal Attempts:  Violent Behaviors:   Past Medical History:   Past Medical History  Diagnosis Date  . Tremors of nervous system   . Psychiatric problem     Seen at [facility] for unknown reason. Takes seroquel. History of hearing voices.  Not commandivng voices.   . Hypertension     Allergies:  No Known Allergies PTA Medications: Prescriptions prior to admission  Medication Sig Dispense Refill  . QUEtiapine Fumarate (SEROQUEL XR) 150 MG 24 hr tablet Take 150 mg by mouth at bedtime.        Previous Psychotropic Medications:  Medication/Dose    unknown               Substance Abuse History in the last 12 months:  Patient denies Substance Age of 1st Use Last Use Amount Specific Type  Nicotine      Alcohol      Cannabis      Opiates      Cocaine      Methamphetamines      LSD      Ecstasy      Benzodiazepines      Caffeine      Inhalants      Others:                         Consequences of Substance Abuse: N/A   Social History: Current Place of Residence:   Place of Birth:   Family Members: Marital Status:  Single Children:  Sons:  Daughters: Relationships: Education:     Educational Problems/Performance: Religious Beliefs/Practices: History of Abuse (Emotional/Phsycial/Sexual) Occupational Experiences; Military History:   Legal History: Hobbies/Interests:  Family History:  History reviewed. No pertinent family history. ROS: Negative with the exception of the HPI. PE: Completed by the MD in the ED. Mental Status Examination/Evaluation: Objective:  Appearance: Disheveled  Eye Contact::  Fair  Speech:  Slow  Volume:  Normal  Mood:  Anxious, Depressed and Worthless  Affect:  Non-Congruent and Flat  Thought Process:  Disorganized  Orientation:  Other:    Thought Content:  Hallucinations: Auditory  Suicidal Thoughts:  no  Homicidal Thoughts:  No  Memory:  Immediate;   Fair  Judgement:  Impaired  Insight:  Lacking  Psychomotor Activity:  Restlessness  Concentration:  Poor  Recall:  Poor  Akathisia:  No  Handed:  Right  AIMS (if indicated):     Assets:    Sleep:  Number of Hours: 2.75     Laboratory/X-Ray Psychological Evaluation(s)      Assessment:    AXIS I:  Schizoaffective disorder AXIS II:   AXIS III:   Past Medical History  Diagnosis Date  . Tremors of  nervous system   . Psychiatric problem     Seen at [facility] for unknown reason. Takes seroquel. History of hearing voices.  Not commandivng voices.   . Hypertension    AXIS IV:  other psychosocial or environmental problems, problems related to social environment and problems with access to health care services AXIS V:  51-60 moderate symptoms  Treatment Plan/Recommendations:  Treatment Plan Summary: 1. Admit for crisis management and stabilization. 2. Medication management to reduce current symptoms to base line and improve the     patient's overall level of functioning 3. Treat health problems as indicated. 4. Develop treatment plan to decrease risk of relapse upon discharge and the need for     readmission. 5. Psycho-social education regarding relapse prevention and self  care. 6. Health care follow up as needed for medical problems. 7. Restart home medications where appropriate.  Current Medications:  Current Facility-Administered Medications  Medication Dose Route Frequency Provider Last Rate Last Dose  . acetaminophen (TYLENOL) tablet 650 mg  650 mg Oral Q6H PRN Mike Craze, MD      . alum & mag hydroxide-simeth (MAALOX/MYLANTA) 200-200-20 MG/5ML suspension 30 mL  30 mL Oral Q4H PRN Mike Craze, MD      . benztropine (COGENTIN) tablet 1 mg  1 mg Oral QHS Curlene Labrum Readling, MD   1 mg at 10/27/11 2111  . citalopram (CELEXA) tablet 20 mg  20 mg Oral Daily Curlene Labrum Readling, MD   20 mg at 10/27/11 1956  . haloperidol (HALDOL) tablet 5 mg  5 mg Oral QHS Curlene Labrum Readling, MD   5 mg at 10/27/11 2111  . magnesium hydroxide (MILK OF MAGNESIA) suspension 30 mL  30 mL Oral Daily PRN Mike Craze, MD      . traZODone (DESYREL) tablet 100 mg  100 mg Oral QHS PRN Ronny Bacon, MD       Facility-Administered Medications Ordered in Other Encounters  Medication Dose Route Frequency Provider Last Rate Last Dose  . DISCONTD: alum & mag hydroxide-simeth (MAALOX/MYLANTA) 200-200-20 MG/5ML suspension 30 mL  30 mL Oral PRN Nelia Shi, MD      . DISCONTD: benztropine (COGENTIN) tablet 0.5 mg  0.5 mg Oral BID Flint Melter, MD   0.5 mg at 10/26/11 2214  . DISCONTD: buPROPion (WELLBUTRIN XL) 24 hr tablet 300 mg  300 mg Oral Daily Flint Melter, MD   300 mg at 10/26/11 1141  . DISCONTD: ibuprofen (ADVIL,MOTRIN) tablet 600 mg  600 mg Oral Q8H PRN Nelia Shi, MD      . DISCONTD: LORazepam (ATIVAN) tablet 1 mg  1 mg Oral Q8H PRN Nelia Shi, MD   1 mg at 10/26/11 2214  . DISCONTD: nicotine (NICODERM CQ - dosed in mg/24 hours) patch 21 mg  21 mg Transdermal Daily Nelia Shi, MD   21 mg at 10/26/11 1142  . DISCONTD: ondansetron (ZOFRAN) tablet 4 mg  4 mg Oral Q8H PRN Nelia Shi, MD      . DISCONTD: QUEtiapine (SEROQUEL XR) 24 hr tablet 150 mg  150 mg  Oral QHS Flint Melter, MD   150 mg at 10/26/11 2214  . DISCONTD: risperiDONE (RISPERDAL) tablet 3 mg  3 mg Oral BID Hurman Horn, MD   3 mg at 10/26/11 1335  . DISCONTD: zolpidem (AMBIEN) tablet 5 mg  5 mg Oral QHS PRN Nelia Shi, MD   5 mg at 10/26/11 2318  Observation Level/Precautions:    Laboratory:    Psychotherapy:    Medications:    Routine PRN Medications:  Yes  Consultations:    Discharge Concerns:    Other:     Lloyd Huger T. Veleka Djordjevic [facility] 10/27/2011 11:43 PM  8/31/201311:15 PM

## 2011-10-27 NOTE — Progress Notes (Signed)
Psychoeducational Group Note  Date:  10/27/2011 Time:  1015  Group Topic/Focus:  Identifying Needs:   The focus of this group is to help patients identify their personal needs that have been historically problematic and identify healthy behaviors to address their needs.  Participation Level: Did Not Attend  Participation Quality:  Not Applicable  Affect:  Not Applicable  Cognitive:  Not Applicable  Insight:  Not Applicable  Engagement in Group: Not Applicable  Additional Comments:    Rich Brave 10/27/2011, 11:51 AM

## 2011-10-27 NOTE — Progress Notes (Signed)
D Pt is seen lying in his bed. He holds his self inventory. He is mumbling to himself. HE avoids making eye contact. He has stayed, primarily, in his bed, in his room today.  He admits that he is hearing voices, but denies the voices are command.             A He  Does not attend groups, he demonstrates thougth blocking and disorganized thinking and contracts for safety with this nurse.              R POC in place and safety maintained PD RN [facility]

## 2011-10-27 NOTE — Progress Notes (Signed)
Writer entered patients room and observed him lying in bed awake, resting. Patient provided assistance filling out his self inventory. Patient reports he hears voices and that is why he has not been attending groups because he has trouble focusing. Patient informed that he would have medications started tonight and is agreeable to taking them. Patient denies si/hi/auditory hallucinations. Patient appears sad and depressed, writer informed patient that he had a message to call his mother and he reported that she is not really his mother and does not want to talk to her right now but did not elaborate as to why. Patient reports that he feels that he needs to be rehabilatated and proper medications before he feels that he will be better. Support offered, safety maintained on unit, will continue to monitor.

## 2011-10-28 LAB — COMPREHENSIVE METABOLIC PANEL
ALT: 14 U/L (ref 0–53)
Alkaline Phosphatase: 85 U/L (ref 39–117)
CO2: 28 mEq/L (ref 19–32)
Calcium: 9.3 mg/dL (ref 8.4–10.5)
GFR calc Af Amer: >90 mL/min (ref >90)
GFR calc non Af Amer: >90 mL/min (ref >90)
Glucose, Bld: 96 mg/dL (ref 70–99)
Sodium: 137 mEq/L (ref 135–145)

## 2011-10-28 LAB — T4, FREE: Free T4: 1.18 ng/dL (ref 0.80–1.80)

## 2011-10-28 NOTE — Progress Notes (Signed)
Bryan [name] presents to the med window...at 0945, after he has seen the physician this morning. HE is guarded. He is tense, apprehensive and makes poor eye contact. HE demonstrates thought blocking as he processes questions asked of him ( by this Clinical research associate) and then hesitates as he answers in one-word answers" yes" he slept ok last night, " yes", he's still hearing voices" yes" he will contract for safety.  A He takes his morning celexa as prescribed, laughs and comments " it's so little" as he swallows the pill. HE hesitates, again, when this nurse asks him to complete his AM self inventory and return it...saying " I can't make up my mind". This Clinical research associate asks him if he understands the questions and offers help  And he denies the need for assistance.  R Safety is in place and POC cont. PD RN [facility]

## 2011-10-28 NOTE — Progress Notes (Signed)
[facility] Adult Inpatient Family/Significant Other Suicide Prevention Education  Suicide Prevention Education:  Patient Refusal for Family/Significant Other Suicide Prevention Education: The patient Bryan [name] has refused to provide written consent for family/significant other to be provided Family/Significant Other Suicide Prevention Education during admission and/or prior to discharge.  Physician notified.  Pt. Had problems with thought blocking. May want to ask again when pt. Is thinking clearer.  Neila Gear 10/28/2011, 10:55 AM

## 2011-10-28 NOTE — Progress Notes (Signed)
Patient came to medication window and received his hs medications. Patient reports that he did not sleep well last night and was offered a prn of trazadone which he accepted. When writer inquired as to if he had been up and in the dayroom any today he reported that he still feels confused and not focused. Patient reports he still hears voices but not command voices telling him to harm self. Patient offered snack before taking medications but he declined reporting he doesn't have much of an appetite. Support offered, denies pain, -si/hi/ visual hallucinations. Safety maintained on unit, will continue to monitor.

## 2011-10-28 NOTE — Progress Notes (Signed)
[facility] Group Notes:  (Counselor/Nursing/MHT/Case Management/Adjunct)  10/28/2011 11 AM  Type of Therapy:  Aftercare Planning, Group Therapy, Dance/Movement Therapy   Participation Level:  Did not attend   Bryan [name] 10/28/2011. 11:47 AM  

## 2011-10-28 NOTE — [facility] Counselor (Signed)
Adult Comprehensive Assessment  Patient ID: Bryan [name], male   DOB: 01/18/1976, 36 y.o.   MRN: [id number]  Information Source: Information source: Patient  Current Stressors:  Educational / Learning stressors: 8th grade Employment / Job issues: Unemployed Family Relationships: Pt. reports no problems Surveyor, quantity / Lack of resources (include bankruptcy): Pt. has no income Housing / Lack of housing: Pt. is unsure of where he will be styaing-Pt. moving around a lot Physical health (include injuries & life threatening diseases): Pt. does not report any problems Social relationships: Pt. reports he does not have a lot of friends Substance abuse: Pt. deies use Bereavement / Loss: Pt. does not report any problems  Living/Environment/Situation:  Living Arrangements: Alone Living conditions (as described by patient or guardian): Pt. reports it wa sokay but he moved around a lot How long has patient lived in current situation?: 1 week What is atmosphere in current home: Temporary  Family History:  Marital status: Separated Separated, when?: Pt. reports being seperated for a while What types of issues is patient dealing with in the relationship?: Pt. cannot remember Additional relationship information: No additionall information Does patient have children?: No  Childhood History:  By whom was/is the patient raised?: Other (Comment) (Pt. states he cannot remember) Additional childhood history information: Pt. cannot remember Description of patient's relationship with caregiver when they were a child: Pt. cannot remember Patient's description of current relationship with people who raised him/her: Pt. states he cannot remember Does patient have siblings?: No Did patient suffer any verbal/emotional/physical/sexual abuse as a child?: No Did patient suffer from severe childhood neglect?: No Has patient ever been sexually abused/assaulted/raped as an adolescent or adult?: No Was the patient  ever a victim of a crime or a disaster?: No Witnessed domestic violence?: No Has patient been effected by domestic violence as an adult?: No  Education:  Highest grade of school patient has completed: *th grade Currently a student?: No Learning disability?: Yes What learning problems does patient have?: Pt. reports problems with math and reading  Employment/Work Situation:   Employment situation: Unemployed Patient's job has been impacted by current illness: No What is the longest time patient has a held a job?: couple moths Where was the patient employed at that time?: [employer] Has patient ever been in the [employer]?: No Has patient ever served in Buyer, retail?: No  Financial Resources:   Surveyor, quantity resources: No income Does patient have a Lawyer or guardian?: No  Alcohol/Substance Abuse:   What has been your use of drugs/alcohol within the last 12 months?: Pt. deies use Alcohol/Substance Abuse Treatment Hx: Past Tx, Inpatient If yes, describe treatment: Pt. is unsure Has alcohol/substance abuse ever caused legal problems?: No  Social Support System:   Forensic psychologist System: None Describe Community Support System: Pt. is unsure of support Type of faith/religion: Pt. is   Leisure/Recreation:   Leisure and Hobbies: Pt. is unsure  Strengths/Needs:   What things does the patient do well?: Pt. states he cannot answer In what areas does patient struggle / problems for patient: Having a place to live  Discharge Plan:   Does patient have access to transportation?: No Plan for no access to transportation at discharge: Pt. is unsure of tranportation Will patient be returning to same living situation after discharge?: No Plan for living situation after discharge: Pt. is unsure of discharge plans Currently receiving community mental health services: No If no, would patient like referral for services when discharged?: Yes (What county?) [facility]  [place] Does patient have financial barriers related to discharge medications?: No  Summary/Recommendations:   Summary and Recommendations (to be completed by the evaluator): Pt. is a 36 year old male admittted for Schizoeffective disorder. Pt. was able to complete PSA but anwered a lot of the questions as "I do not know how to answer that".  Pt. refused contact but stated a family member  brought him to [facility] but was unable to tell the name of the person who brought him in.. Pt. will need a referral for a doctor/Counselour. Pt. recommendations include: Crisis Stablization, Case mangemnt, Group therphy, and medication management.  Akosua Constantine [facility] 10/28/2011

## 2011-10-28 NOTE — Progress Notes (Signed)
  Bryan [name] is a 36 y.o. male [id number] [birth year]  10/27/2011 Principal Problem:  *Schizoaffective disorder, depressive type   Mental Status: Alert mood is anxiously depressed denies SI/HI/AVH.    Subjective/Objective: Doesn't know what his future is as he has no income. Says he used to wander. Told him he is the only one who can stop his wandering.    Filed Vitals:   10/28/11 0809  BP: 131/84  Pulse: 79  Temp:   Resp:     Lab Results:   BMET    Component Value Date/Time   NA 137 10/28/2011 0655   K 4.3 10/28/2011 0655   CL 100 10/28/2011 0655   CO2 28 10/28/2011 0655   GLUCOSE 96 10/28/2011 0655   BUN 13 10/28/2011 0655   CREATININE 0.97 10/28/2011 0655   CALCIUM 9.3 10/28/2011 0655   GFRNONAA >90 10/28/2011 0655   GFRAA >90 10/28/2011 0655    Medications:  Scheduled:     . benztropine  1 mg Oral QHS  . citalopram  20 mg Oral Daily  . haloperidol  5 mg Oral QHS     PRN Meds acetaminophen, alum & mag hydroxide-simeth, magnesium hydroxide, traZODone  Plan: Continue current plan of care. Case manager needs to help with discharge placement.  Johnpaul Gillentine,MICKIE D. 10/28/2011

## 2011-10-28 NOTE — Progress Notes (Signed)
Psychoeducational Group Note  Date:  10/28/2011 Time:  0945 am  Group Topic/Focus:  Making Healthy Choices:   The focus of this group is to help patients identify negative/unhealthy choices they were using prior to admission and identify positive/healthier coping strategies to replace them upon discharge.  Participation Level:  Did Not Attend   Myreon Wimer J 10/28/2011, 10:29 AM  

## 2011-10-28 NOTE — Progress Notes (Signed)
[facility] Adult Inpatient Family/Significant Other Suicide Prevention Education  Suicide Prevention Education:  Patient Refusal for Family/Significant Other Suicide Prevention Education: The patient Bryan [name] has refused to provide written consent for family/significant other to be provided Family/Significant Other Suicide Prevention Education during admission and/or prior to discharge.  Physician notified. Pt. States he lives also  and has no contact person  Neila Gear 10/28/2011, 9:58 AM

## 2011-10-28 NOTE — Progress Notes (Signed)
[facility] Group Notes:  (Counselor/Nursing/MHT/Case Management/Adjunct)  10/28/2011 12:26 AM  Type of Therapy:  Psychoeducational Skills  Participation Level:  Minimal  Participation Quality:  Attentive  Affect:  Flat  Cognitive:  Appropriate  Insight:  Limited  Engagement in Group:  Limited  Engagement in Therapy:  Limited  Modes of Intervention:  Education  Summary of Progress/Problems: He stated in group this evening that he had an "alright" day due to the fact that he is "recuperating" at this time. He could not think of a goal for tomorrow.    Sayde Lish S 10/28/2011, 12:26 AM

## 2011-10-29 MED ORDER — CITALOPRAM HYDROBROMIDE 40 MG PO TABS
40.0000 mg | ORAL_TABLET | Freq: Every day | ORAL | Status: DC
  Administered 2011-10-30 – 2011-11-06 (×8): 40 mg via ORAL
  Filled 2011-10-29 (×11): qty 1

## 2011-10-29 MED ORDER — HALOPERIDOL 5 MG PO TABS
10.0000 mg | ORAL_TABLET | Freq: Every day | ORAL | Status: DC
  Administered 2011-10-29 – 2011-10-30 (×2): 10 mg via ORAL
  Filled 2011-10-29 (×4): qty 2

## 2011-10-29 NOTE — Progress Notes (Signed)
Psychoeducational Group Note  Date:  10/29/2011 Time:  1100  Group Topic/Focus:  Self Care:   The focus of this group is to help patients understand the importance of self-care in order to improve or restore emotional, physical, spiritual, interpersonal, and financial health.  Participation Level:  Did Not Attend  Participation Quality:    Affect:    Cognitive:    Insight:    Engagement in Group:    Additional Comments:  none  Marquis Lunch, Sravya Grissom 10/29/2011, 1:14 PM

## 2011-10-29 NOTE — Progress Notes (Signed)
Psychoeducational Group Note  Date:  10/29/2011 Time:  2000  Group Topic/Focus:  Wrap-Up Group:   The focus of this group is to help patients review their daily goal of treatment and discuss progress on daily workbooks.  Participation Level: Did Not Attend  Participation Quality:  Not Applicable  Affect:  Not Applicable  Cognitive:  Not Applicable  Insight:  Not Applicable  Engagement in Group: Not Applicable  Additional Comments:  The patient did not attend group this evening.   Hazle Coca S 10/29/2011, 12:57 AM

## 2011-10-29 NOTE — Progress Notes (Signed)
[facility] MD Progress Note  10/29/2011 3:28 PM  Current Mental Status Per Physician:  Diagnosis:  Axis I: Schizoaffective Disorder - Depressive Type.   The patient was seen today and reports the following:   ADL's: Intact.  Sleep: The patient reports to sleeping reasonably well. Appetite: The patient reports a good appetite today.   Mild>(1-10) >Severe  Hopelessness (1-10): 9-10  Depression (1-10): 9-10  Anxiety (1-10): 9-10   Suicidal Ideation: The patient reports current suicidal ideations today but with no plan or intent. Plan: No  Intent: No  Means: No   Homicidal Ideation: The patient denies any homicidal ideations today.  Plan: No  Intent: No.  Means: No   General Appearance/Behavior: The patient was cooperative today with this provider but appeared significantly depressed.  Eye Contact: Good.  Speech: Appropriate in rate and volume with no pressuring of speech noted today.  Motor Behavior: wnl.  Level of Consciousness: Alert and Oriented x 3.  Mental Status: Alert and Oriented x 3.  Mood: Severely Depressed.  Affect: Essentially Flat.  Anxiety Level: Severe anxiety reported today.  Thought Process: Auditory hallucinations reported.  Thought Content: The patient reports to hearing the voices which are reportedly encouraging him to kill himself. He denies any visual hallucinations or delusions thinking.  Perception:. Auditory hallucinations reported.  Judgment: Good.  Insight: Good.  Cognition: Oriented to person, place and time.  Sleep:  Number of Hours: 5.5    Vital Signs:Blood pressure 106/61, pulse 107, temperature 98.1 F (36.7 C), temperature source Oral, resp. rate 20, height 5\' 9"  (1.753 m), weight 85.73 kg (189 lb).  Current Medications: Current Facility-Administered Medications  Medication Dose Route Frequency Provider Last Rate Last Dose  . acetaminophen (TYLENOL) tablet 650 mg  650 mg Oral Q6H PRN Mike Craze, MD      . alum & mag hydroxide-simeth  (MAALOX/MYLANTA) 200-200-20 MG/5ML suspension 30 mL  30 mL Oral Q4H PRN Mike Craze, MD      . benztropine (COGENTIN) tablet 1 mg  1 mg Oral QHS Curlene Labrum Giannie Soliday, MD   1 mg at 10/28/11 2131  . citalopram (CELEXA) tablet 40 mg  40 mg Oral Daily Curlene Labrum Alexsander Cavins, MD      . haloperidol (HALDOL) tablet 10 mg  10 mg Oral QHS Andreu Drudge D Hakeen Shipes, MD      . magnesium hydroxide (MILK OF MAGNESIA) suspension 30 mL  30 mL Oral Daily PRN Mike Craze, MD      . traZODone (DESYREL) tablet 100 mg  100 mg Oral QHS PRN Curlene Labrum Gaylia Kassel, MD   100 mg at 10/28/11 2132  . DISCONTD: citalopram (CELEXA) tablet 20 mg  20 mg Oral Daily Curlene Labrum Jai Bear, MD   20 mg at 10/29/11 0821  . DISCONTD: haloperidol (HALDOL) tablet 5 mg  5 mg Oral QHS Curlene Labrum Keyondra Lagrand, MD   5 mg at 10/28/11 2131   Lab Results:  Results for orders placed during the hospital encounter of 10/27/11 (from the past 48 hour(s))  COMPREHENSIVE METABOLIC PANEL     Status: Normal   Collection Time   10/28/11  6:55 AM      Component Value Range Comment   Sodium 137  135 - 145 mEq/L    Potassium 4.3  3.5 - 5.1 mEq/L    Chloride 100  96 - 112 mEq/L    CO2 28  19 - 32 mEq/L    Glucose, Bld 96  70 - 99 mg/dL  BUN 13  6 - 23 mg/dL    Creatinine, Ser 1.61  0.50 - 1.35 mg/dL    Calcium 9.3  8.4 - 09.6 mg/dL    Total Protein 6.5  6.0 - 8.3 g/dL    Albumin 3.7  3.5 - 5.2 g/dL    AST 12  0 - 37 U/L    ALT 14  0 - 53 U/L    Alkaline Phosphatase 85  39 - 117 U/L    Total Bilirubin 0.3  0.3 - 1.2 mg/dL    GFR calc non Af Amer >90  >90 mL/min    GFR calc Af Amer >90  >90 mL/min   TSH     Status: Normal   Collection Time   10/28/11  6:55 AM      Component Value Range Comment   TSH 0.765  0.350 - 4.500 uIU/mL   T3, FREE     Status: Normal   Collection Time   10/28/11  6:55 AM      Component Value Range Comment   T3, Free 2.9  2.3 - 4.2 pg/mL   T4, FREE     Status: Normal   Collection Time   10/28/11  6:55 AM      Component Value Range Comment   Free T4  1.18  0.80 - 1.80 ng/dL    Physical Findings: AIMS: Facial and Oral Movements Muscles of Facial Expression: None, normal Lips and Perioral Area: None, normal Jaw: None, normal Tongue: None, normal,Extremity Movements Upper (arms, wrists, hands, fingers): None, normal Lower (legs, knees, ankles, toes): None, normal, Trunk Movements Neck, shoulders, hips: None, normal, Overall Severity Severity of abnormal movements (highest score from questions above): None, normal Incapacitation due to abnormal movements: None, normal Patient's awareness of abnormal movements (rate only patient's report): No Awareness, Dental Status Current problems with teeth and/or dentures?: No Does patient usually wear dentures?: No   Review of Systems:  Neurological: No headaches, seizures or dizziness reported.  G.I.: The patient denies any constipation or stomach upset today.  Musculoskeletal: The patient denies any musculoskeletal issues today.   Time was spent today discussing with the patient his current symptoms. The patient reports sleeping well and reports a good appetite. He reports severe feelings of sadness, anhedonia and depressed mood as well as severe anxiety today. He denies any homicidal ideations today but reports current suicidal ideations today with no plan or intent.He reports ongoing auditory hallucinations which he states has decreased in volume but reportedly instructing him to kill himself.  He denies any visual hallucinations today or delusional thinking. The patient denies any medication related side effects.   Treatment Plan Summary:  1. Daily contact with patient to assess and evaluate symptoms and progress in treatment.  2. Medication management  3. The patient will deny suicidal ideations or homicidal ideations for 48 hours prior to discharge and have a depression and anxiety rating of 3 or less. The patient will also deny any auditory or visual hallucinations or delusional thinking.  4.  The patient will deny any symptoms of substance withdrawal at time of discharge.   Plan:  1. Will increase the medication Haldol to 10 mgs po qhs to further address his psychosis.  2. Will continue the patient on the medication Cogentin at 1 mg po qhs for EPS.  3. Will increase the medication Celexa to 40 mgs po q am for depression and anxiety.  4. Will continue the medication Trazodone at 100 mgs po qhs - prn  for sleep.  5. Laboratory Studies reviewed.  6. Will continue to monitor.   Bryan [name] 10/29/2011, 3:28 PM

## 2011-10-29 NOTE — Progress Notes (Signed)
Patient has poor eye contact; seems to have some thought blocking; positive for voices.  He denies any SI/HI.  He is compliant with his medications.  He has minimal contact with staff and others on the unit.  He has not attended any groups and no participation in his treatment thus far.    Continue to monitor medication management and MD orders.  Safety checks completed every 15 minutes per protocol. Collaborate with treatment team members regarding patient's POC.  Encourage patient to attend groups and participate in his treatment.  Patient's behavior has been appropriate on this unit.

## 2011-10-30 DIAGNOSIS — Z550 Illiteracy and low-level literacy: Secondary | ICD-10-CM

## 2011-10-30 NOTE — Progress Notes (Signed)
[facility] Group Notes:  (Counselor/Nursing/MHT/Case Management/Adjunct)  10/30/2011 5:11 PM  Type of Therapy:  Group Therapy  Participation Level:  Did Not Attend    Bryan [name] 10/30/2011, 5:11 PM 

## 2011-10-30 NOTE — Progress Notes (Signed)
Psychoeducational Group Note  Date:  10/30/2011 Time:  0930  Group Topic/Focus:  Recovery Goals:   The focus of this group is to identify appropriate goals for recovery and establish a plan to achieve them.  Participation Level: Did Not Attend  Participation Quality:  Not Applicable  Affect:  Not Applicable  Cognitive:  Not Applicable  Insight:  Not Applicable  Engagement in Group: Not Applicable  Additional Comments:  Pt was asleep and did not attend group.  Egidio Lofgren E 10/30/2011, 11:48 AM

## 2011-10-30 NOTE — Progress Notes (Signed)
Patient continues to be isolative on the unit; he does not attend group or participate in his treatment.  His depression remains high at a 9; he denies any SI/HI.  He does endorse AH.  During treatment team meeting, discharge planning was discussed and it is uncertain where patient will go after discharge from [facility].  He does not have any support in the area.    Continue to monitor medication management and MD orders.  Safety checks completed every 15 minutes per protocol. Collaborate with treatment team members regarding patient's POC.  Encourage patient to attend groups and participate in his treatment.  Patient's behavior has been appropriate on the unit.

## 2011-10-30 NOTE — Progress Notes (Signed)
Patient ID: Bryan [name], male   DOB: June 07, 1975, 36 y.o.   MRN: [id number]  Pt asleep at this time; no s/s of distress noted currently.

## 2011-10-30 NOTE — Discharge Planning (Signed)
10/30/2011  SW met with pt.  Pt presented as unsure about his symptoms.  Pt reported Depression at 9/10 and Anxiety at 5/10 and states he is hearing voices a little today.  Pt states he had SI yesterday off and on, but did not have any SI today.  Pt needs housing.  SW to continue to monitor for referrals.   [name], LCASA 10/30/2011, 6:25 PM

## 2011-10-30 NOTE — Tx Team (Signed)
Interdisciplinary Treatment Plan Update (Adult) Date: 10/30/2011  Time Reviewed: 10:30 AM  Progress in Treatment: Attending groups: Yes Participating in groups: Yes Taking medication as prescribed: Yes Tolerating medication: Yes Family/Significant othe contact made: Counselor to assess for appropriate contacts. Patient understands diagnosis: Yes Discussing patient identified problems/goals with staff: Yes Medical problems stabilized or resolved: Yes Denies suicidal/homicidal ideation: Yes Issues/concerns per patient self-inventory: None identified Other: N/A New problem(s) identified: None Identified Reason for Continuation of Hospitalization: Anxiety Depression Medication stabilization Interventions implemented related to continuation of hospitalization: mood stabilization, medication monitoring and adjustment, group therapy and psycho education, safety checks q 15 mins Additional comments: N/A Estimated length of stay:  Discharge Plan: SW is assessing for appropriate referrals.  New goal(s): N/A Review of initial/current patient goals per problem list:  1. Goal(s): Reduce depressive symptoms  Met: No  Target date: by discharge  As evidenced by: Reducing depression from a 10 to a 3 as reported by pt.  2. Goal (s): Eliminate Suicidal Ideation  Met: No  Target date: by discharge  As evidenced by: Eliminate suicidal ideation.  3. Goal(s): Reduce Psychosis  Met: No  Target date: by discharge  As evidenced by: Reduce psychotic symptoms to baseline, as reported by pt.   Attendees: Patient: Bryan [name]    Family:    Physician: Franchot Gallo, MD 10/30/2011 10:30 AM   Nursing:    Case Manager: [name], LCASA 10/30/2011 10:30 AM   Counselor: Veto Kemps, MT-BC 10/30/2011 10:30 AM   Other: Joslyn Devon, RN 10/30/2011 10:30 AM   Other:    Other:    Other:    Scribe for Treatment Team:  [name], LCASA 10/30/2011 10:30 AM

## 2011-10-30 NOTE — Progress Notes (Signed)
Patient ID: Bryan [name], male   DOB: 21-Aug-1975, 36 y.o.   MRN: [id number]  Problem: Schizoaffective D/O  D: Pt isolative to his room with no staff/peer interaction. Pt endorses depression.  A: Monitor patient Q 15 minutes for safety, encourage staff/peer interaction, administer medications as ordered by MD.  R: Pt remains secluded in his room, pt took medications but declined to attend group.

## 2011-10-30 NOTE — Progress Notes (Addendum)
[facility] MD Progress Note                                         10/30/2011    Bryan [name] 1975/06/17    [id number] Hospital day #3  1. Schizoaffective disorder, depressive type    The patient was seen today and reports the following:  Sleep: "average" Appetite: alright  Mild (1-10) Severe  Depression (1-10): 8 Anxiety (1-10): 5-6 Hopelessness (1-10):  "a little"  Suicidal Ideation: The patient denies suicidal ideation. Plan: None Intent: None Means: None  Homicidal Ideation: The patient denies homicidal ideation. Plan: None Intent: None Means: None  Eye Contact:  fair General Appearance: blue scrubs Behavior:  Cooperative  Motor Behavior: normal Speech: clear and goal directed  Mental Status:  Orientation x 2/5 Level of Consciousness:   alert Mood: depressed Affect: congruent   Thought Process: thought blocking Thought Content:  Auditory hallucinations about the same Perception: ?  Judgment: impaired Insight: lacking Cognition: less than average  VS: height is 5\' 9"  (1.753 m) and weight is 85.73 kg (189 lb). His oral temperature is 97.5 F (36.4 C). His blood pressure is 100/67 and his pulse is 92. His respiration is 20.  Current Medication:  . benztropine  1 mg Oral QHS  . citalopram  40 mg Oral Daily  . haloperidol  10 mg Oral QHS  . DISCONTD: citalopram  20 mg Oral Daily  . DISCONTD: haloperidol  5 mg Oral QHS    Lab results: No results found for this or any previous visit (from the past 48 hours. Group attendance: Poor patient states that he gets nervous when he goes into groups as he feels he may give the wrong answer.  ROS:    Constitutional: WDWN Adult in NAD   GI: Negative for N,V,D,C   Neuro: Negative for dizziness, blurred vision, visual changes, headaches   Resp: Negative for wheezing, SOB, cough   Cardio: Negative for CP, diaphoresis, fatigue   MSK: Negative for joint pain, swelling, DROM, or ambulatory difficulties.  Time was spent  with the patient discussing the current symptoms and the response to treatment. He is disorganized today to 2/5 attempts. He is also suffering from thought blocking.  Bryan [name] notes that he has not had much education and this is a source of embarrassment for him. He also states that he is feeling very fatigued and exhausted.  Treatment Summary: 1. Continue crisis management and stabilization. 2. Medication management to reduce current symptoms to base line and improve the patient's overall level of functioning 3. Treat health problems as indicated. 4. Develop treatment plan to decrease risk of relapse upon discharge and the need for readmission. 5. Psycho-social education regarding relapse prevention and self care. 6. Health care follow up as needed for medical problems. 7. Restart home medications where appropriate.   Treatment Plan: 1. Will continue the medication Haldol to 10 mgs po qhs to further address his psychosis.  2. Will continue the patient on the medication Cogentin at 1 mg po qhs for EPS.  3. Will continue the medication Celexa to 40 mgs po q am for depression and anxiety.  4. Will continue the medication Trazodone at 100 mgs po qhs - prn for sleep.  5. Laboratory Studies reviewed. TSH, CMP reviewed, all WNL. 6. Will continue to monitor.  Bryan [name]. Bryan [name] [facility] 10/30/2011

## 2011-10-31 MED ORDER — HALOPERIDOL 5 MG PO TABS
15.0000 mg | ORAL_TABLET | Freq: Every day | ORAL | Status: DC
  Administered 2011-10-31 – 2011-11-05 (×6): 15 mg via ORAL
  Filled 2011-10-31 (×8): qty 3

## 2011-10-31 NOTE — Progress Notes (Signed)
D.  Pt. Has a flat affect, depressed mood.  Pt. Was sleeping and did not go to recreational therapy this evening.  Denies SI/HI and denies A/V hallucinations.   A.  Support given. R. Pt. Receptive.

## 2011-10-31 NOTE — Progress Notes (Signed)
Psychoeducational Group Note  Date:  10/31/2011 Time:  2000  Group Topic/Focus:  Wrap-Up Group:   The focus of this group is to help patients review their daily goal of treatment and discuss progress on daily workbooks.  Participation Level: Did Not Attend  Participation Quality:  Not Applicable  Affect:  Not Applicable  Cognitive:  Not Applicable  Insight:  Not Applicable  Engagement in Group: Not Applicable  Additional Comments:  Despite invitation from staff, Pt chose not to attend the Wrap Up Group.  Christ Kick 10/31/2011, 8:41 PM

## 2011-10-31 NOTE — Progress Notes (Signed)
D:  Bryan [name] asked for assistance in doing his self-inventory because he can't read or write.  He states that he slept well and him appetite is improving. He was unable to give his depression a number but states that he remains "very depressed".  He denies SI/HI, but does complain of hearing voices. He states that these voices are distracting and keep him from being able to concentrate and gather his thoughts.  He is not going to groups because he states that speaking in front of people makes him nervous and anxious.  He went down to breakfast this morning, but stated that he got "very nervous".   A: Provided emotional support, administered medications as ordered. R:  Safety maintained on unit.

## 2011-10-31 NOTE — Discharge Planning (Signed)
10/31/2011  Pt did not attend d/c planning group on this date. SW met with pt individually at this time.   SW discussed options of discharging to group home if patient was interested.  Pt stated he wanted to think about this.  SW to continue to assess for referrals.

## 2011-10-31 NOTE — Progress Notes (Signed)
[facility] MD Progress Note  10/31/2011 1:08 PM  Current Mental Status Per Physician:  Diagnosis:  Axis I: Schizoaffective Disorder - Depressive Type.   The patient was seen today and reports the following:   ADL's: Intact.  Sleep: The patient reports to sleeping well at night. Appetite: The patient reports that his appetite is improving.   Mild>(1-10) >Severe  Hopelessness (1-10): 5  Depression (1-10): 9-10  Anxiety (1-10): 5   Suicidal Ideation: The patient denies any suicidal ideations this morning for the first time since admission. Plan: No  Intent: No  Means: No   Homicidal Ideation: The patient denies any homicidal ideations today.  Plan: No  Intent: No.  Means: No   General Appearance/Behavior: The patient was cooperative today with this provider but again appeared significantly depressed.  Eye Contact: Good.  Speech: Appropriate in rate and volume with no pressuring of speech noted today.  Motor Behavior: wnl.  Level of Consciousness: Alert and Oriented x 3.  Mental Status: Alert and Oriented x 3.  Mood: Severely Depressed.  Affect: Essentially Flat.  Anxiety Level: Moderate anxiety reported today.  Thought Process: Auditory hallucinations reported.  Thought Content: The patient reports to continuing to hear voices which are reportedly encouraging him to kill himself. He denies any visual hallucinations or delusions thinking.  Perception:. Auditory hallucinations reported.  Judgment: Good.  Insight: Good.  Cognition: Oriented to person, place and time.  Sleep:  Number of Hours: 6.75    Vital Signs:Blood pressure 114/74, pulse 109, temperature 98.3 F (36.8 C), temperature source Oral, resp. rate 20, height 5\' 9"  (1.753 m), weight 85.73 kg (189 lb).  Current Medications: Current Facility-Administered Medications  Medication Dose Route Frequency Provider Last Rate Last Dose  . acetaminophen (TYLENOL) tablet 650 mg  650 mg Oral Q6H PRN Mike Craze, MD      . alum  & mag hydroxide-simeth (MAALOX/MYLANTA) 200-200-20 MG/5ML suspension 30 mL  30 mL Oral Q4H PRN Mike Craze, MD      . benztropine (COGENTIN) tablet 1 mg  1 mg Oral QHS Curlene Labrum Catia Todorov, MD   1 mg at 10/30/11 2118  . citalopram (CELEXA) tablet 40 mg  40 mg Oral Daily Curlene Labrum Lil Lepage, MD   40 mg at 10/31/11 0804  . haloperidol (HALDOL) tablet 15 mg  15 mg Oral QHS Denya Buckingham D Zarrah Loveland, MD      . magnesium hydroxide (MILK OF MAGNESIA) suspension 30 mL  30 mL Oral Daily PRN Mike Craze, MD      . traZODone (DESYREL) tablet 100 mg  100 mg Oral QHS PRN Curlene Labrum Curstin Schmale, MD   100 mg at 10/29/11 2148  . DISCONTD: haloperidol (HALDOL) tablet 10 mg  10 mg Oral QHS Curlene Labrum Burgundy Matuszak, MD   10 mg at 10/30/11 2118   Lab Results: No results found for this or any previous visit (from the past 48 hour(s)).  Physical Findings: AIMS: Facial and Oral Movements Muscles of Facial Expression: None, normal Lips and Perioral Area: None, normal Jaw: None, normal Tongue: None, normal,Extremity Movements Upper (arms, wrists, hands, fingers): None, normal Lower (legs, knees, ankles, toes): None, normal, Trunk Movements Neck, shoulders, hips: None, normal, Overall Severity Severity of abnormal movements (highest score from questions above): None, normal Incapacitation due to abnormal movements: None, normal Patient's awareness of abnormal movements (rate only patient's report): No Awareness, Dental Status Current problems with teeth and/or dentures?: No Does patient usually wear dentures?: No   Review of Systems:  Neurological: No headaches, seizures or dizziness reported.  G.I.: The patient denies any constipation or stomach upset today.  Musculoskeletal: The patient denies any musculoskeletal issues today.   Time was spent today discussing with the patient his current symptoms. The patient reports sleeping well last night and reports that his appetite is improving. He reports ongoing severe feelings of sadness,  anhedonia and depressed mood as well as moderate anxiety today. He denies any homicidal ideations today and reports resolution of his suicidal ideations for the first time since admission.  He reports ongoing auditory hallucinations which he states has decreased in volume but reportedly instructing him to kill himself. He denies any visual hallucinations today or delusional thinking. The patient denies any medication related side effects.   The patient states today that he feels he needs placement and would not do well if discharged to a shelter.  The patient's Case Manager will meet with the patient today to discuss the patient's options.  Treatment Plan Summary:  1. Daily contact with patient to assess and evaluate symptoms and progress in treatment.  2. Medication management  3. The patient will deny suicidal ideations or homicidal ideations for 48 hours prior to discharge and have a depression and anxiety rating of 3 or less. The patient will also deny any auditory or visual hallucinations or delusional thinking.  4. The patient will deny any symptoms of substance withdrawal at time of discharge.   Plan:  1. Will increase the medication Haldol to 15 mgs po qhs to further address his psychosis.  2. Will continue the patient on the medication Cogentin at 1 mg po qhs for EPS.  3. Will continue the medication Celexa to 40 mgs po q am for depression and anxiety.  4. Will continue the medication Trazodone at 100 mgs po qhs - prn for sleep.  5. Laboratory Studies reviewed.  6. Will continue to monitor.   Bryan [name] 10/31/2011, 1:08 PM

## 2011-10-31 NOTE — Progress Notes (Signed)
[facility] Group Notes:  (Counselor/Nursing/MHT/Case Management/Adjunct)  10/31/2011 1:42 PM  Type of Therapy:  Psychoeducational Skills  Participation Level:  Active  Participation Quality:  Attentive  Affect:  Flat  Cognitive:  Oriented  Insight:  Limited  Engagement in Group:  Limited  Engagement in Therapy:  Limited  Modes of Intervention:  Education and Support  Summary of Progress/Problems: Patient was attentive to speaker from mental health association.   Farron Watrous, Aram Beecham 10/31/2011, 1:42 PM

## 2011-10-31 NOTE — Progress Notes (Signed)
10/31/2011         Time: 0930      Group Topic/Focus: The focus of this group is on enhancing the patient's understanding of leisure, barriers to leisure, and the importance of engaging in positive leisure activities upon discharge for improved total health.  Participation Level: Did not attend  Participation Quality: Not Applicable  Affect: Not Applicable  Cognitive: Not Applicable   Additional Comments: Patient refused group.   Hung Rhinesmith 10/31/2011 10:11 AM  

## 2011-11-01 DIAGNOSIS — R5383 Other fatigue: Secondary | ICD-10-CM

## 2011-11-01 DIAGNOSIS — Z559 Problems related to education and literacy, unspecified: Secondary | ICD-10-CM

## 2011-11-01 NOTE — Progress Notes (Signed)
Patient remains isolative.  He continues to report auditory hallucinations.  He denies any SI/HI.  He has minimal interactions with staff.  Patient does not attend all groups or participate in his treatment.    Continue to monitor medication management and MD orders.  Safety checks completed every 15 minutes per protocol. Collaborate with treatment team members regarding patient's POC.  Encourage patient to attend groups and participate in his treatment.  His behavior has been appropriate.

## 2011-11-01 NOTE — Progress Notes (Signed)
Pt laying in bed resting with eyes closed. Respirations even and unlabored. No distress noted.  

## 2011-11-01 NOTE — Progress Notes (Signed)
[facility] MD Progress Note                                         11/01/2011    Bryan [name] Feb 03, 1976    [id number] Hospital day #5  1. Schizoaffective disorder, depressive type   2. Illiterate   3. Other malaise and fatigue    The patient was seen today and reports the following:  Sleep:  OK Appetite: OK    Mild (1-10) Severe  Depression (1-10): OK Anxiety (1-10):   0 Hopelessness (1-10):  ?   Suicidal Ideation: The patient denies suicidal ideation. Plan: None Intent: None Means: None  Homicidal Ideation: The patient denies homicidal ideation. Plan: None Intent: None Means: None  Eye Contact: fair General Appearance:  In scrubs Behavior:  Cooperative  Motor Behavior:  normal Speech: clear  Mental Status:  Orientation x 2 Level of Consciousness:   drowsey Mood: depressed Affect:congruent   Thought Process:linear Thought Content: denies [facility] Perception: fair  Judgment:  impaired Insight: lacking Cognition: below average  VS: height is 5\' 9"  (1.753 m) and weight is 85.73 kg (189 lb). His oral temperature is 98.2 F (36.8 C). His blood pressure is 129/84 and his pulse is 119. His respiration is 20.  Current Medication:  . benztropine  1 mg Oral QHS  . citalopram  40 mg Oral Daily  . haloperidol  15 mg Oral QHS    Lab results: No results found for this or any previous visit (from the past 48 hour(s)).   No results found for this or any previous visit for  Last 48 hours.  Group attendance: patient notes that he is trying to attend groups but gets extremely anxious in a group setting.  ROS:    Constitutional: WDWN Adult in NAD   GI: Negative for N,V,D,C   Neuro: Negative for dizziness, blurred vision, visual changes, headaches   Resp: Negative for wheezing, SOB, cough   Cardio: Negative for CP, diaphoresis, fatigue   MSK: Negative for joint pain, swelling, DROM, or ambulatory difficulties.  Time was spent with the patient discussing the current  symptoms and the response to treatment. Today Bryan [name] and I discussed his possibly going into a group home for further stabilization upon his discharge.  He is open to this and will consider this.  Treatment Summary: 1. Admit for crisis management and stabilization. 2. Medication management to reduce current symptoms to base line and improve the patient's overall level of functioning 3. Treat health problems as indicated. 4. Develop treatment plan to decrease risk of relapse upon discharge and the need for readmission. 5. Psycho-social education regarding relapse prevention and self care. 6. Health care follow up as needed for medical problems. 7. Restart home medications where appropriate.   Treatment Plan: Plan:  1. Will continue the medication Haldol to 15 mgs po qhs to further address his psychosis.  2. Will continue the patient on the medication Cogentin at 1 mg po qhs for EPS.  3. Will continue the medication Celexa to 40 mgs po q am for depression and anxiety.  4. Will continue the medication Trazodone at 100 mgs po qhs - prn for sleep.  5. Laboratory Studies reviewed.  6. Will continue to monitor.   [name]. Dinita Migliaccio [facility] 11/01/2011

## 2011-11-01 NOTE — Progress Notes (Signed)
Psychoeducational Group Note  Date:  11/01/2011 Time:  2000  Group Topic/Focus:  karaoke  Participation Level: Did Not Attend  Participation Quality:  Not Applicable  Affect:  Not Applicable  Cognitive:  Not Applicable  Insight:  Not Applicable  Engagement in Group: Not Applicable  Additional Comments:  Pt stated that he did not want to go to karaoke this evening.  Kaleen Odea R 11/01/2011, 10:07 PM

## 2011-11-01 NOTE — Discharge Planning (Signed)
11/01/2011  Pt did not attend d/c planning group on this date. SW met with pt individually at this time.   SW found patient wants to ask his family member about if he should go into a group home to assist him with structure and handling some of his needs.  SW discussed treatment with a CST team to also help.  SW to continue to assess for referrals.    [name], LCASA

## 2011-11-01 NOTE — Progress Notes (Signed)
[facility] Group Notes:  (Counselor/Nursing/MHT/Case Management/Adjunct)  11/01/2011 12:53 PM  Type of Therapy:  Group Therapy  Participation Level:  Did Not Attend   Bryan [name] 11/01/2011, 12:53 PM

## 2011-11-01 NOTE — Progress Notes (Signed)
Psychoeducational Group Note  Date:  11/01/2011 Time:  0930  Group Topic/Focus:  Rediscovering Joy:   The focus of this group is to explore various ways to relieve stress in a positive manner.  Participation Level: Did Not Attend  Participation Quality:  Not Applicable  Affect:  Not Applicable  Cognitive:  Not Applicable  Insight:  Not Applicable  Engagement in Group: Not Applicable  Additional Comments:  Pt refused to attend group this morning.  Melanny Wire E 11/01/2011, 11:55 AM

## 2011-11-01 NOTE — Progress Notes (Signed)
[facility] MD Progress Note                                         11/01/2011    Bryan [name] 11-21-1975    [id number] Hospital day #5  1. Schizoaffective disorder, depressive type   2. Illiterate   3. Other malaise and fatigue    The patient was seen today and reports the following:  Sleep:  Appetite:   Mild (1-10) Severe  Depression (1-10): Anxiety (1-10):  Hopelessness (1-10):   Suicidal Ideation: The patient denies suicidal ideation. Plan: None Intent: None Means: None  Homicidal Ideation: The patient denies homicidal ideation. Plan: None Intent: None Means: None  Eye Contact:   General Appearance: Behavior:  Cooperative  Motor Behavior:  Speech:  Mental Status:  Orientation x Level of Consciousness:   alert Mood:  Affect:   Thought Process: Thought Content:  Perception:  Judgment:  Insight: Cognition:  VS: height is 5\' 9"  (1.753 m) and weight is 85.73 kg (189 lb). His oral temperature is 98.2 F (36.8 C). His blood pressure is 129/84 and his pulse is 119. His respiration is 20.  Current Medication:   . benztropine  1 mg Oral QHS  . citalopram  40 mg Oral Daily  . haloperidol  15 mg Oral QHS  . DISCONTD: haloperidol  10 mg Oral QHS    Lab results: No results found for this or any previous visit (from the past 48 hour(s)).   No results found for this or any previous visit for  Last 48 hours.  Group attendance:   ROS:    Constitutional: WDWN Adult in NAD   GI: Negative for N,V,D,C   Neuro: Negative for dizziness, blurred vision, visual changes, headaches   Resp: Negative for wheezing, SOB, cough   Cardio: Negative for CP, diaphoresis, fatigue   MSK: Negative for joint pain, swelling, DROM, or ambulatory difficulties.  Time was spent with the patient discussing the current symptoms and the response to treatment.  Treatment Summary: 1. Admit for crisis management and stabilization. 2. Medication management to reduce current symptoms to  base line and improve the  patient's overall level of functioning 3. Treat health problems as indicated. 4. Develop treatment plan to decrease risk of relapse upon discharge and the need for readmission. 5. Psycho-social education regarding relapse prevention and self care. 6. Health care follow up as needed for medical problems. 7. Restart home medications where appropriate.   Treatment Plan: Will continue the medication Haldol to 15 mgs po qhs to further address his psychosis.  2. Will continue the patient on the medication Cogentin at 1 mg po qhs for EPS.  3. Will continue the medication Celexa to 40 mgs po q am for depression and anxiety.  4. Will continue the medication Trazodone at 100 mgs po qhs - prn for sleep.  5. Laboratory Studies reviewed.  6. Will continue to monitor.  [name]. Blimie Vaness [facility] 11/01/2011

## 2011-11-02 MED ORDER — HYDROXYZINE HCL 25 MG PO TABS
25.0000 mg | ORAL_TABLET | ORAL | Status: DC | PRN
  Administered 2011-11-02: 25 mg via ORAL

## 2011-11-02 NOTE — Progress Notes (Addendum)
Pt. seclusive to room this evening.  Said he doesn't feel so well. Still can't focus, can't concentrate.  Denies SI/HI.  Will continue to monitor.  Remains safe on the unit.  Continues on q 15 minute checks. Pt said he still hears voices but they are hard to describe.  Will continue to monitor.

## 2011-11-02 NOTE — Progress Notes (Signed)
D   Pt in bed resting with his eyes closed  No distress noted  A   Q 15 min checks R   Pt safe at present

## 2011-11-02 NOTE — Progress Notes (Signed)
D:  Pt about on the unit today.  He has been somewhat isolative to room at times. He does deny SI/HI and A/V hallucinations.  He has not been attending groups.  He says he can't be around a lot of people.  He asks to be moved to a private room or even off the unit to the main hospital.  He reports he can't focus, concentrate and says that he gets real nervous.  RN shared with him that I would pass on his request regarding his room to the charge nurse, however that having a private room at this time was not possible.  He voiced understanding. RN discussed with him about possibly taking med to help with anxiety/nerves.  He was receptive.  A:  Rn talked with patient about his concerns and he was given support.  He was given prn Vistaril.  R:  Pt is receptive to staff. Remains on q 15 min. checks

## 2011-11-02 NOTE — Progress Notes (Signed)
[facility] MD Progress Note  11/02/2011 3:10 PM  S: "I'm in bed now because I'm tired. I still hear voices, but it is hard to describe it to another person. I am not seeing anything right at this moment.  I think the medications are doing alright, just needed some more time To work more".  Diagnosis:   Axis I: Schizoaffective disorder, depressive type Axis II: Deferred Axis III:  Past Medical History  Diagnosis Date  . Tremors of nervous system   . Psychiatric problem     Seen at [facility] for unknown reason. Takes seroquel. History of hearing voices.  Not commandivng voices.   . Hypertension    Axis IV: other psychosocial or environmental problems Axis V: 41-50 serious symptoms  ADL's:  Intact  Sleep: Good  Appetite:  Good  Suicidal Ideation: "No" Plan:  No Intent:  No Means:  no Homicidal Ideation:  Plan:  No Intent:  no Means:  no  AEB (as evidenced by): Per patient's reports.  Mental Status Examination/Evaluation: Objective:  Appearance: Casual  Eye Contact::  Fair  Speech:  Clear and Coherent  Volume:  Normal  Mood:  "I'm doing fine"  Affect:  Appropriate  Thought Process:  Coherent  Orientation:  Full  Thought Content:  Rumination  Suicidal Thoughts:  No  Homicidal Thoughts:  No  Memory:  Immediate;   Good Recent;   Good Remote;   Good  Judgement:  Fair  Insight:  Fair  Psychomotor Activity:  Normal  Concentration:  Good  Recall:  Good  Akathisia:  No  Handed:  Right  AIMS (if indicated):     Assets:  Desire for Improvement  Sleep:  Number of Hours: 6    Vital Signs:Blood pressure 131/77, pulse 79, temperature 98 F (36.7 C), temperature source Oral, resp. rate 16, height 5\' 9"  (1.753 m), weight 85.73 kg (189 lb). Current Medications: Current Facility-Administered Medications  Medication Dose Route Frequency Provider Last Rate Last Dose  . acetaminophen (TYLENOL) tablet 650 mg  650 mg Oral Q6H PRN Mike Craze, MD      . alum & mag  hydroxide-simeth (MAALOX/MYLANTA) 200-200-20 MG/5ML suspension 30 mL  30 mL Oral Q4H PRN Mike Craze, MD      . benztropine (COGENTIN) tablet 1 mg  1 mg Oral QHS Curlene Labrum Readling, MD   1 mg at 11/01/11 2145  . citalopram (CELEXA) tablet 40 mg  40 mg Oral Daily Curlene Labrum Readling, MD   40 mg at 11/02/11 0855  . haloperidol (HALDOL) tablet 15 mg  15 mg Oral QHS Curlene Labrum Readling, MD   15 mg at 11/01/11 2144  . hydrOXYzine (ATARAX/VISTARIL) tablet 25 mg  25 mg Oral Q4H PRN Verne Spurr, PA-C   25 mg at 11/02/11 1452  . magnesium hydroxide (MILK OF MAGNESIA) suspension 30 mL  30 mL Oral Daily PRN Mike Craze, MD      . traZODone (DESYREL) tablet 100 mg  100 mg Oral QHS PRN Ronny Bacon, MD   100 mg at 10/29/11 2148    Lab Results: No results found for this or any previous visit (from the past 48 hour(s)).  Physical Findings: AIMS: Facial and Oral Movements Muscles of Facial Expression: None, normal Lips and Perioral Area: None, normal Jaw: None, normal Tongue: None, normal,Extremity Movements Upper (arms, wrists, hands, fingers): None, normal Lower (legs, knees, ankles, toes): None, normal, Trunk Movements Neck, shoulders, hips: None, normal, Overall Severity Severity of abnormal movements (highest  score from questions above): None, normal Incapacitation due to abnormal movements: None, normal Patient's awareness of abnormal movements (rate only patient's report): No Awareness, Dental Status Current problems with teeth and/or dentures?: No Does patient usually wear dentures?: No  CIWA:    COWS:     Treatment Plan Summary: Daily contact with patient to assess and evaluate symptoms and progress in treatment Medication management  Plan: No changes made on current treatment regimen. Continue current treatment plan.  Armandina Stammer I 11/02/2011, 3:10 PM

## 2011-11-02 NOTE — Progress Notes (Signed)
[facility] Group Notes:  (Counselor/Nursing/MHT/Case Management/Adjunct)  11/02/2011 2:27 PM  Type of Therapy:  Group Therapy  Participation Level:  Did Not Attend    Bryan [name] 11/02/2011, 2:27 PM

## 2011-11-02 NOTE — Progress Notes (Signed)
Psychoeducational Group Note  Date:  11/02/2011 Time:  2000  Group Topic/Focus:  Goals Group:   The focus of this group is to help patients establish daily goals to achieve during treatment and discuss how the patient can incorporate goal setting into their daily lives to aide in recovery.  Participation Level: Did Not Attend  Participation Quality:  Not Applicable  Affect:  Not Applicable  Cognitive:  Not Applicable  Insight:  Not Applicable  Engagement in Group: Not Applicable  Additional Comments:  Pt. Did not attend Bryan [name] 11/02/2011, 10:24 PM

## 2011-11-02 NOTE — Discharge Planning (Signed)
11/02/2011  Pt did not attend d/c planning group on this date. SW met with pt individually at this time.    Patient states he would like to be linked to community agency for follow-up and discharge home with a family member Bryan [name].  SW obtained consent.  SW continue to monitor and assess for referrals.  [name], LCASA 11/02/2011, 4:41 PM

## 2011-11-03 NOTE — Progress Notes (Signed)
Patient ID: Bryan [name], male   DOB: [birth year], 36 y.o.   MRN: [id number] D. The patient remained in bed with his eyes closed all evening.  A. The patient was encouraged to attend evening wrap up group. Administered HS medications. R. The patient did not attend group or leave his room all evening. No eye contact or interaction with staff. Compliant with medication.

## 2011-11-03 NOTE — Progress Notes (Signed)
Patient ID: Bryan [name], male   DOB: [birth year], 36 y.o.   MRN: [id number] [facility] MD Progress Note  11/03/2011 4:49 PM  Reports good sleep. Thinks he is getting better overall. No new acute issues.  Diagnosis:   Axis I: Schizoaffective disorder, depressive type Axis II: Deferred Axis III:  Past Medical History  Diagnosis Date  . Tremors of nervous system   . Psychiatric problem     Seen at [facility] for unknown reason. Takes seroquel. History of hearing voices.  Not commandivng voices.   . Hypertension    Axis IV: other psychosocial or environmental problems Axis V: 41-50 serious symptoms  ADL's:  Intact  Sleep: Good  Appetite:  Good  Suicidal Ideation: "No" Plan:  No Intent:  No Means:  no Homicidal Ideation:  Plan:  No Intent:  no Means:  no  AEB (as evidenced by): Per patient's reports.  Mental Status Examination/Evaluation: Objective:  Appearance: Casual  Eye Contact::  Fair  Speech:  Clear and Coherent  Volume:  Normal  Mood:  "I'm doing fine"  Affect:  Appropriate  Thought Process:  disorganised  Orientation:  Full  Thought Content:  AVH  Suicidal Thoughts:  No  Homicidal Thoughts:  No  Memory:  Immediate;   Good Recent;   Good Remote;   Good  Judgement:  Fair  Insight:  Fair  Psychomotor Activity:  Normal  Concentration:  Good  Recall:  Good  Akathisia:  No  Handed:  Right  AIMS (if indicated):     Assets:  Desire for Improvement  Sleep:  Number of Hours: 6.5    Vital Signs:Blood pressure 102/68, pulse 103, temperature 98.7 F (37.1 C), temperature source Oral, resp. rate 16, height 5\' 9"  (1.753 m), weight 85.73 kg (189 lb). Current Medications: Current Facility-Administered Medications  Medication Dose Route Frequency Provider Last Rate Last Dose  . acetaminophen (TYLENOL) tablet 650 mg  650 mg Oral Q6H PRN Mike Craze, MD      . alum & mag hydroxide-simeth (MAALOX/MYLANTA) 200-200-20 MG/5ML suspension 30 mL  30 mL Oral Q4H PRN Mike Craze, MD      . benztropine (COGENTIN) tablet 1 mg  1 mg Oral QHS Curlene Labrum Readling, MD   1 mg at 11/02/11 2137  . citalopram (CELEXA) tablet 40 mg  40 mg Oral Daily Curlene Labrum Readling, MD   40 mg at 11/03/11 0804  . haloperidol (HALDOL) tablet 15 mg  15 mg Oral QHS Curlene Labrum Readling, MD   15 mg at 11/02/11 2137  . hydrOXYzine (ATARAX/VISTARIL) tablet 25 mg  25 mg Oral Q4H PRN Verne Spurr, PA-C   25 mg at 11/02/11 1452  . magnesium hydroxide (MILK OF MAGNESIA) suspension 30 mL  30 mL Oral Daily PRN Mike Craze, MD      . traZODone (DESYREL) tablet 100 mg  100 mg Oral QHS PRN Ronny Bacon, MD   100 mg at 10/29/11 2148    Lab Results: No results found for this or any previous visit (from the past 48 hour(s)).  Physical Findings: AIMS: Facial and Oral Movements Muscles of Facial Expression: None, normal Lips and Perioral Area: None, normal Jaw: None, normal Tongue: None, normal,Extremity Movements Upper (arms, wrists, hands, fingers): None, normal Lower (legs, knees, ankles, toes): None, normal, Trunk Movements Neck, shoulders, hips: None, normal, Overall Severity Severity of abnormal movements (highest score from questions above): None, normal Incapacitation due to abnormal movements: None, normal Patient's awareness of abnormal movements (rate  only patient's report): No Awareness, Dental Status Current problems with teeth and/or dentures?: No Does patient usually wear dentures?: No  CIWA:    COWS:     Treatment Plan Summary: Daily contact with patient to assess and evaluate symptoms and progress in treatment Medication management  Plan:  Continue current treatment plan.  Wonda Cerise 11/03/2011, 4:49 PM

## 2011-11-03 NOTE — Progress Notes (Signed)
Pt has been in room much of the day today, very limited interaction or participation in the milieu, pt speaks that he is doing ok, pt does not verbalize any complaints, pt has been receiving medications without incident,support and encouragement provided, will continue to monitor

## 2011-11-03 NOTE — Progress Notes (Signed)
[facility] Group Notes:  (Counselor/Nursing/MHT/Case Management/Adjunct)  11/03/2011 12:13 PM  Type of Therapy:  Discharge Planning/ Group Therapy  Participation Level:   Did not attend   Bryan [name] 11/03/2011, 12:13 PM

## 2011-11-04 NOTE — Progress Notes (Signed)
Patient ID: Bryan [name], male   DOB: October 05, 1975, 36 y.o.   MRN: [id number] D. The patient remained resting in bed with his eyes closed all evening. A. Several attempts were made to get him to get out of bed and to attend evening group. Snack offered. Medications administered. R. Did not attend evening group or go for snack. Compliant with medication.

## 2011-11-04 NOTE — Progress Notes (Signed)
Pt has been isolative much of the day, minimal interaction or participation in milieu, pt did ask to go to quiet room today as he was having difficulties with another pt in the milieu today, pt stated that he was agitated during the encounter but was able to calm himself with staff assistance, pt did receive medications without incident, support and encouragement provided, will continue to monitor

## 2011-11-04 NOTE — Progress Notes (Signed)
Patient ID: Bryan [name], male   DOB: 1976/01/02, 36 y.o.   MRN: [id number] Patient ID: Bryan [name], male   DOB: 1975-08-24, 36 y.o.   MRN: [id number] [facility] MD Progress Note  11/04/2011 4:49 PM  Reports good sleep. Thinks he is getting better overall. No new acute issues.  Diagnosis:   Axis I: Schizoaffective disorder, depressive type Axis II: Deferred Axis III:  Past Medical History  Diagnosis Date  . Tremors of nervous system   . Psychiatric problem     Seen at [facility] for unknown reason. Takes seroquel. History of hearing voices.  Not commandivng voices.   . Hypertension    Axis IV: other psychosocial or environmental problems Axis V: 41-50 serious symptoms  ADL's:  Intact  Sleep: Good  Appetite:  Good  Suicidal Ideation: "No" Plan:  No Intent:  No Means:  no Homicidal Ideation:  Plan:  No Intent:  no Means:  no  AEB (as evidenced by): Per patient's reports.  Mental Status Examination/Evaluation: Objective:  Appearance: Casual  Eye Contact::  Fair  Speech:  Clear and Coherent  Volume:  Normal  Mood:  "I'm doing fine"  Affect:  Appropriate  Thought Process:  disorganised  Orientation:  Full  Thought Content:  AVH  Suicidal Thoughts:  No  Homicidal Thoughts:  No  Memory:  Immediate;   Good Recent;   Good Remote;   Good  Judgement:  Fair  Insight:  Fair  Psychomotor Activity:  Normal  Concentration:  Good  Recall:  Good  Akathisia:  No  Handed:  Right  AIMS (if indicated):     Assets:  Desire for Improvement  Sleep:  Number of Hours: 5.3    Vital Signs:Blood pressure 106/71, pulse 90, temperature 98 F (36.7 C), temperature source Oral, resp. rate 18, height 5\' 9"  (1.753 m), weight 85.73 kg (189 lb). Current Medications: Current Facility-Administered Medications  Medication Dose Route Frequency Provider Last Rate Last Dose  . acetaminophen (TYLENOL) tablet 650 mg  650 mg Oral Q6H PRN Mike Craze, MD      . alum & mag hydroxide-simeth  (MAALOX/MYLANTA) 200-200-20 MG/5ML suspension 30 mL  30 mL Oral Q4H PRN Mike Craze, MD      . benztropine (COGENTIN) tablet 1 mg  1 mg Oral QHS Curlene Labrum Readling, MD   1 mg at 11/03/11 2154  . citalopram (CELEXA) tablet 40 mg  40 mg Oral Daily Curlene Labrum Readling, MD   40 mg at 11/04/11 0759  . haloperidol (HALDOL) tablet 15 mg  15 mg Oral QHS Curlene Labrum Readling, MD   15 mg at 11/03/11 2154  . hydrOXYzine (ATARAX/VISTARIL) tablet 25 mg  25 mg Oral Q4H PRN Verne Spurr, PA-C   25 mg at 11/02/11 1452  . magnesium hydroxide (MILK OF MAGNESIA) suspension 30 mL  30 mL Oral Daily PRN Mike Craze, MD      . traZODone (DESYREL) tablet 100 mg  100 mg Oral QHS PRN Ronny Bacon, MD   100 mg at 10/29/11 2148    Lab Results: No results found for this or any previous visit (from the past 48 hour(s)).  Physical Findings: AIMS: Facial and Oral Movements Muscles of Facial Expression: None, normal Lips and Perioral Area: None, normal Jaw: None, normal Tongue: None, normal,Extremity Movements Upper (arms, wrists, hands, fingers): None, normal Lower (legs, knees, ankles, toes): None, normal, Trunk Movements Neck, shoulders, hips: None, normal, Overall Severity Severity of abnormal movements (highest score from questions above):  None, normal Incapacitation due to abnormal movements: None, normal Patient's awareness of abnormal movements (rate only patient's report): No Awareness, Dental Status Current problems with teeth and/or dentures?: No Does patient usually wear dentures?: No  CIWA:    COWS:     Treatment Plan Summary: Daily contact with patient to assess and evaluate symptoms and progress in treatment Medication management  Plan:  Continue current treatment plan.  Wonda Cerise 11/04/2011, 4:49 PM

## 2011-11-04 NOTE — Progress Notes (Signed)
Psychoeducational Group Note  Date:  11/04/2011 Time:  0945 am  Group Topic/Focus:  Making Healthy Choices:   The focus of this group is to help patients identify negative/unhealthy choices they were using prior to admission and identify positive/healthier coping strategies to replace them upon discharge.  Participation Level:  Did Not Attend   Hakeen Shipes J 11/04/2011, 10:29 AM  

## 2011-11-04 NOTE — Progress Notes (Signed)
[facility] Group Notes:  (Counselor/Nursing/MHT/Case Management/Adjunct)  11/04/2011 11 AM  Type of Therapy:  Aftercare Planning, Group Therapy, Dance/Movement Therapy   Participation Level:  Did Not Attend  [facility] 11/04/2011. 11:51 AM

## 2011-11-05 NOTE — Tx Team (Signed)
Interdisciplinary Treatment Plan Update (Adult) Date: 11/05/2011  Time Reviewed: 10:46 AM  Progress in Treatment: Attending groups: Yes Participating in groups: Yes Taking medication as prescribed: Yes Tolerating medication: Yes Family/Significant othe contact made: Counselor to contact family member Patient understands diagnosis: Yes Discussing patient identified problems/goals with staff: Yes Medical problems stabilized or resolved: Yes Denies suicidal/homicidal ideation: Yes Issues/concerns per patient self-inventory: None identified Other: N/A New problem(s) identified: None Identified Reason for Continuation of Hospitalization: Anxiety Depression Medication stabilization Interventions implemented related to continuation of hospitalization: mood stabilization, medication monitoring and adjustment, group therapy and psycho education, safety checks q 15 mins Additional comments: N/A Estimated length of stay: tentative discharge in 1-2 days Discharge Plan: Patient following up with [facility] for intake and medication management.  New goal(s): N/A Review of initial/current patient goals per problem list:  1. Goal(s): Reduce depressive symptoms  Met: No  Target date: by discharge  As evidenced by: Reducing depression from a 10 to a 3 as reported by pt.  2. Goal (s): Eliminate Suicidal Ideation  Met: No  Target date: by discharge  As evidenced by: Eliminate suicidal ideation.  3. Goal(s): Reduce Psychosis  Met: No  Target date: by discharge  As evidenced by: Reduce psychotic symptoms to baseline, as reported by pt.  4. Goal(s):  Address Follow-up  Met: Yes  Target date: by discharge  As evidenced by: Appts with [facility] Attendees: Patient: Bryan [name] 11/05/2011 10:48 AM  Family:    Physician: Franchot Gallo, MD 11/05/2011 10:46 AM   Nursing:  Erlene Quan, RN 11/05/2011 10:47 AM  Case Manager: [name],  LCASA 11/05/2011 10:46 AM   Counselor: Veto Kemps, MT-BC 11/05/2011 10:46 AM   Other: Joslyn Devon, RN 11/05/2011 10:46 AM   Other:    Other:    Other:    Scribe for Treatment Team:  [name], LCASA 11/05/2011 10:46 AM

## 2011-11-05 NOTE — Discharge Planning (Signed)
11/05/2011  Pt did not attend d/c planning group on this date. SW met with pt individually at this time.   SW linked patient to [facility] and informed him of appointment scheduled.  Pt states he would like to live with his mother figure, [name].  Pt informed of clinical intake scheduled at [facility] and [facility].  Pt states he is hopeful about possibly starting CST services and medication management.  [name], LCASA 11/05/2011, 2:12 PM

## 2011-11-05 NOTE — Progress Notes (Signed)
11/05/2011         Time: 0930      Group Topic/Focus: The focus of this group is on enhancing patients' problem solving skills, which involves identifying the problem, brainstorming solutions and choosing and trying a solution.  Participation Level: Minimal  Participation Quality: Resistant and Redirectable  Affect: Blunted  Cognitive: Alert   Additional Comments: Patient came late to group, participated for a minute, then reported he was too exhausted to participate. Patient allowed to sit down, participated appropriately with encouragement.   Aaliyha Mumford 11/05/2011 10:03 AM

## 2011-11-05 NOTE — Progress Notes (Signed)
[facility] MD Progress Note  11/05/2011 4:06 PM  Current Mental Status Per Physician:  Diagnosis:  Axis I: Schizoaffective Disorder - Depressive Type.   The patient was seen today and reports the following:   ADL's: Intact.  Sleep: The patient reports to sleeping well at night.  Appetite: The patient reports that his appetite is good today.   Mild>(1-10) >Severe  Hopelessness (1-10): 3-4  Depression (1-10): 3-4  Anxiety (1-10): 3-4   Suicidal Ideation: The patient denies any suicidal ideations today.  Plan: No  Intent: No  Means: No   Homicidal Ideation: The patient denies any homicidal ideations today.  Plan: No  Intent: No.  Means: No   General Appearance/Behavior: The patient was friendly and cooperative today with this provider.  Eye Contact: Good.  Speech: Appropriate in rate and volume with no pressuring of speech noted today.  Motor Behavior: wnl.  Level of Consciousness: Alert and Oriented x 3.  Mental Status: Alert and Oriented x 3.  Mood: Mild to moderately depressed.  Affect: Moderately Constricted.  Anxiety Level: Mild to moderate anxiety reported today.  Thought Process: wnl.  Thought Content: The patient denies any auditory hallucinations today and also denies any visual hallucinations or delusions thinking.  Perception:. wnl.  Judgment: Good.  Insight: Good.  Cognition: Oriented to person, place and time.  Sleep:  Number of Hours: 6.75    Vital Signs:Blood pressure 124/75, pulse 108, temperature 97.3 F (36.3 C), temperature source Oral, resp. rate 20, height 5\' 9"  (1.753 m), weight 85.73 kg (189 lb).  Current Medications: Current Facility-Administered Medications  Medication Dose Route Frequency Provider Last Rate Last Dose  . acetaminophen (TYLENOL) tablet 650 mg  650 mg Oral Q6H PRN Mike Craze, MD      . alum & mag hydroxide-simeth (MAALOX/MYLANTA) 200-200-20 MG/5ML suspension 30 mL  30 mL Oral Q4H PRN Mike Craze, MD      . benztropine (COGENTIN)  tablet 1 mg  1 mg Oral QHS Curlene Labrum Pamelia Botto, MD   1 mg at 11/04/11 2153  . citalopram (CELEXA) tablet 40 mg  40 mg Oral Daily Curlene Labrum Lailani Tool, MD   40 mg at 11/05/11 0759  . haloperidol (HALDOL) tablet 15 mg  15 mg Oral QHS Curlene Labrum Maisy Newport, MD   15 mg at 11/04/11 2151  . hydrOXYzine (ATARAX/VISTARIL) tablet 25 mg  25 mg Oral Q4H PRN Verne Spurr, PA-C   25 mg at 11/02/11 1452  . magnesium hydroxide (MILK OF MAGNESIA) suspension 30 mL  30 mL Oral Daily PRN Mike Craze, MD      . traZODone (DESYREL) tablet 100 mg  100 mg Oral QHS PRN Ronny Bacon, MD   100 mg at 10/29/11 2148   Lab Results: No results found for this or any previous visit (from the past 48 hour(s)).  Physical Findings: AIMS: Facial and Oral Movements Muscles of Facial Expression: None, normal Lips and Perioral Area: None, normal Jaw: None, normal Tongue: None, normal,Extremity Movements Upper (arms, wrists, hands, fingers): None, normal Lower (legs, knees, ankles, toes): None, normal, Trunk Movements Neck, shoulders, hips: None, normal, Overall Severity Severity of abnormal movements (highest score from questions above): None, normal Incapacitation due to abnormal movements: None, normal Patient's awareness of abnormal movements (rate only patient's report): No Awareness, Dental Status Current problems with teeth and/or dentures?: No Does patient usually wear dentures?: No   Review of Systems:  Neurological: No headaches, seizures or dizziness reported.  G.I.: The patient denies any  constipation or stomach upset today.  Musculoskeletal: The patient denies any musculoskeletal issues today.   Time was spent today discussing with the patient his current symptoms. The patient reports to sleeping well last night and reports that his appetite is also good.  He reports mild to moderate feelings of sadness, anhedonia and depressed mood as well as mild to moderate anxiety today. He denies any suicidal ideations today as  well as homicidal ideations.  He reports resolution of his auditory hallucinations and denies any visual hallucinations today or delusional thinking. The patient denies any medication related side effects or other concerns today.   Treatment Plan Summary:  1. Daily contact with patient to assess and evaluate symptoms and progress in treatment.  2. Medication management  3. The patient will deny suicidal ideations or homicidal ideations for 48 hours prior to discharge and have a depression and anxiety rating of 3 or less. The patient will also deny any auditory or visual hallucinations or delusional thinking.  4. The patient will deny any symptoms of substance withdrawal at time of discharge.   Plan:  1. Will continue the medication Haldol at 15 mgs po qhs to continue to address his psychosis.  2. Will continue the patient on the medication Cogentin at 1 mg po qhs for EPS.  3. Will continue the medication Celexa to 40 mgs po q am for depression and anxiety.  4. Will continue the medication Trazodone at 100 mgs po qhs - prn for sleep.  5. Laboratory Studies reviewed.  6. Will continue to monitor.   Letasha Kershaw 11/05/2011, 4:06 PM

## 2011-11-05 NOTE — Progress Notes (Addendum)
Patient ID: Bryan [name], male   DOB: 1975-06-30, 36 y.o.   MRN: [id number]   D: Patient lying in bed on approach this am. Reports that his depression is starting to improve since he has been here. Previous shifts have reported that he still isolates during the day and has been staying in his room or to self. Currently denies any SI at this time. States that he is not hearing voices thus far this am but answered the question as if he does hear voices at times. Has not completed self inventory thus far today. A: Staff will monitor q 15 minutes and encourage group attendance. RN to follow treatment and medication orders. R: Took med without issue this morning and laid back down in bed.

## 2011-11-05 NOTE — Progress Notes (Signed)
Psychoeducational Group Note  Date:  11/05/2011 Time:  2000  Group Topic/Focus:  Wrap-Up Group:   The focus of this group is to help patients review their daily goal of treatment and discuss progress on daily workbooks.  Participation Level:  Minimal  Participation Quality:  Appropriate  Affect:  Depressed  Cognitive:  Appropriate  Insight:  Good  Engagement in Group:  Good  Additional Comments:  Patient attended and participated in group tonight. He reports that although this was the first group he attended today, his day was a lot better than yesterday. He describe his supports system as his family members. He reports that upon discharge he will seek additional support system in his community.    Lita Mains [facility] 11/05/2011, 1:10 AM

## 2011-11-05 NOTE — Progress Notes (Signed)
D: Resting quietly in bed with eyes closed on approach. Opened eyes spontaneously to name. Appears flat and depressed. Calm and cooperative with assessment. No acute distress noted. States he has had good day, but did not elaborate what made it a good day. States he feels tired and just needs to rest. Denies SI/HI/AVH and contracts for safety. Offered no questions or concerns.  A: Safety has been maintained with Q15 minute observation. Support and encouragement provided. POC and medications for the shift reviewed and understanding verbalized. Meds given per MD order. Encouraged pt to come out of room and offered a snack, which was refused.  R: Pt remains safe. He is depressed and isolative. He offers no questions or concerns. Will continue Q15 min observation and continue current POC.

## 2011-11-05 NOTE — Progress Notes (Signed)
Psychoeducational Group Note  Date:  11/05/2011 Time:  2000  Group Topic/Focus:  Goals Group:   The focus of this group is to help patients establish daily goals to achieve during treatment and discuss how the patient can incorporate goal setting into their daily lives to aide in recovery.  Participation Level:  Did Not Attend  Participation Quality:  Did not attend  Affect:  Appropriate  Cognitive:  Appropriate  Insight:  None  Engagement in Group:  None  Additional Comments:  Patient was difficult to wake up for group after calling his name several times.  Lyndee Hensen 11/05/2011, 9:52 PM

## 2011-11-05 NOTE — Progress Notes (Signed)
Patient ID: Bryan [name], male   DOB: [birth year], 36 y.o.   MRN: [id number] The patient spent a great deal of time resting in bed. He was encouraged to attend evening wrap up  group. He came late, but did attend group. He identified his family as his support system, but could add no other information. Is unaware of any discharge plans. Returned to bed after group. Compliant with medications.

## 2011-11-06 MED ORDER — HALOPERIDOL 5 MG PO TABS: 15.0000 mg | ORAL_TABLET | Freq: Every day | ORAL | Status: DC

## 2011-11-06 MED ORDER — TRAZODONE HCL 100 MG PO TABS: 100.0000 mg | ORAL_TABLET | Freq: Every evening | ORAL | Status: DC | PRN

## 2011-11-06 MED ORDER — CITALOPRAM HYDROBROMIDE 40 MG PO TABS: 40.0000 mg | ORAL_TABLET | Freq: Every day | ORAL | Status: DC

## 2011-11-06 MED ORDER — BENZTROPINE MESYLATE 1 MG PO TABS: 1.0000 mg | ORAL_TABLET | Freq: Every day | ORAL | Status: DC

## 2011-11-06 NOTE — Progress Notes (Signed)
[facility] Group Notes:  (Counselor/Nursing/MHT/Case Management/Adjunct)  11/06/2011 1:23 PM  Type of Therapy:  Group Therapy  Participation Level:  Did Not Attend  Vanetta Mulders, LPCA    Mana Haberl L 11/06/2011, 1:23 PM

## 2011-11-06 NOTE — Progress Notes (Signed)
[facility] Case Management Discharge Plan:  Will you be returning to the same living situation after discharge: Yes,  Patient discharging to mother's home.  Writer met with mother in lobby.  She has no concerns regarding patient returning to her home.  She is hopeful that patient will be compliant with medications. At discharge, do you have transportation home?:Yes,  Mother picked patient up for transport home. Do you have the ability to pay for your medications:No.  Patient assisted with indigent medications  Interagency Information:     Release of information consent forms completed and in the chart;  Patient's signature needed at discharge.  Patient to Follow up at:  Follow-up Information    Follow up with [facility] on 11/12/2011. (Appt with Breck Coons, LPC at 2pm.)    Contact information:   [address]  [place], [place] [address] [phone] fax [phone]         Patient denies SI/HI:   Yes,  Patient not endorsing SI/HI or thoughts of self harm    Safety Planning and Suicide Prevention discussed:  Yes,  Reviewed with patient individally prior to discharge  Barrier to discharge identified:Yes,  History of noncompliance with medications and follow up  Summary and Recommendations:  Patient encourage to be compliant with medications and follow up with outpatient recommendations     Wynn Banker 11/06/2011, 1:35 PM

## 2011-11-06 NOTE — Progress Notes (Signed)
Psychoeducational Group Note  Date:  11/06/2011 Time:  0930  Group Topic/Focus:  Recovery Goals:   The focus of this group is to identify appropriate goals for recovery and establish a plan to achieve them.  Participation Level: Did Not Attend  Participation Quality:  Not Applicable  Affect:  Not Applicable  Cognitive:  Not Applicable  Insight:  Not Applicable  Engagement in Group: Not Applicable  Additional Comments:  Pt refused to attend group this morning.  Synia Douglass E 11/06/2011, 11:34 AM

## 2011-11-06 NOTE — Tx Team (Signed)
Interdisciplinary Treatment Plan Update (Adult)  Date: 11/06/2011  Time Reviewed: 1000  Progress in Treatment: Attending groups: Yes Participating in groups:  Some Taking medication as prescribed: Yes Tolerating medication:  Yes Family/Significant other contact made:  Yes, with mother. Patient understands diagnosis:  Yes Discussing patient identified problems/goals with staff:  Yes Medical problems stabilized or resolved:  Yes Denies suicidal/homicidal ideation: Yes Issues/concerns per patient self-inventory:  None identified Other: N/A  Estimated length of stay: Discharging today.  Discharge Plan: [facility] on 11/12/11. Patient verbalized intent to comply with discharge plan. AVS reviewed with both patient and mother.   Review of initial/current patient goals per problem list:   1.  Goal(s): Reduce depressive symptoms  Met:  yes  As evidenced by: Reducing depression from a 10 to a 3 as reported by pt.   2.  Goal (s): Eliminate Suicidal Ideation  Met:  Yes  As evidenced by: Eliminate suicidal ideation.   3.  Goal(s): Reduce Psychosis  Met:  Yes, denies SI/HI.  As evidenced by: Reduce psychotic symptoms to baseline, as reported by pt.       Attendees: Patient:  Bryan [name] 11/06/2011 1000  Family:     Physician:  Franchot Gallo, MD 11/06/2011 1000  Nursing:      Case Manager:  11/06/2011 1000  Counselor:  Veto Kemps, MT-BC 11/06/2011 1000  Other:  Joslyn Devon RN 11/06/2011 1000  Other:  Barrie Folk RN MS EdS 11/06/2011 1000  Other:     Other:      Scribe for Treatment Team:   Barrie Folk 11/06/2011  1000

## 2011-11-06 NOTE — Progress Notes (Addendum)
Discharge Note: Patient eager for discharge. Denies SI/HI or psychosis. Patient educated on AVS, [facility] directions for f/u appointment given to patient, prescriptions given. No further questions voiced. Joice Lofts RN MS EdS 11/06/2011  12:01 PM  Patient walked out. AVS and medications reviewed with mother. Patient denied SI/HI or psychosis. Joice Lofts RN MS EdS 11/06/2011  1:43 PM

## 2011-11-06 NOTE — [facility] Suicide Risk Assessment (Signed)
Suicide Risk Assessment  Discharge Assessment     Demographic Factors:  Male, Adolescent or young adult, Low socioeconomic status and Unemployed  Mental Status Per Nursing Assessment::   On Admission:    At time of Discharge:  Time was spent today discussing with the patient his current symptoms. The patient reports to continuing to sleep well last night and reports that his appetite is also good. He reports mild feelings of sadness, anhedonia and depressed mood as well as mild anxiety today. He adamantly denies any suicidal ideations today as well as homicidal ideations.  He reports ongoing resolution of his auditory hallucinations and denies any visual hallucinations or delusional thinking. The patient denies any medication related side effects or other concerns today and is requesting discharge home.  The patient's Mother has also been contacted for collateral information and feels the patient is at baseline and ready for discharge.  Current Mental Status Per Physician:  Diagnosis:  Axis I: Schizoaffective Disorder - Depressive Type.   The patient was seen today and reports the following:   ADL's: Intact.  Sleep: The patient reports to sleeping well at night.  Appetite: The patient reports that his appetite is good today.   Mild>(1-10) >Severe  Hopelessness (1-10): 2-3  Depression (1-10): 2-3  Anxiety (1-10): 2-3   Suicidal Ideation: The patient adamantly denies any suicidal ideations today.  Plan: No  Intent: No  Means: No   Homicidal Ideation: The patient adamantly denies any homicidal ideations today.  Plan: No  Intent: No.  Means: No   General Appearance/Behavior: The patient remains friendly and cooperative today with this provider.  Eye Contact: Good.  Speech: Appropriate in rate and volume with no pressuring of speech noted today.  Motor Behavior: wnl.  Level of Consciousness: Alert and Oriented x 3.  Mental Status: Alert and Oriented x 3.  Mood: Mildly depressed.    Affect: Mild to moderately constricted.  Anxiety Level: Mild anxiety reported today.  Thought Process: wnl.  Thought Content: The patient denies any auditory or visual hallucinations today as well as any delusions thinking.  Perception:. wnl.  Judgment: Good.  Insight: Good.  Cognition: Oriented to person, place and time.   Loss Factors: No acute losses.  Historical Factors: Long history of mental illness.    Risk Reduction Factors:   Good family support.  Good access to healthcare.  Continued Clinical Symptoms:  Depression:   Anhedonia Previous Psychiatric Diagnoses and Treatments Medical Diagnoses and Treatments/Surgeries  Current Medications:  Current Facility-Administered Medications   Medication  Dose  Route  Frequency  Provider  Last Rate  Last Dose   .  acetaminophen (TYLENOL) tablet 650 mg  650 mg  Oral  Q6H PRN  Mike Craze, MD     .  alum & mag hydroxide-simeth (MAALOX/MYLANTA) 200-200-20 MG/5ML suspension 30 mL  30 mL  Oral  Q4H PRN  Mike Craze, MD     .  benztropine (COGENTIN) tablet 1 mg  1 mg  Oral  QHS  Curlene Labrum Lulubelle Simcoe, MD   1 mg at 11/04/11 2153   .  citalopram (CELEXA) tablet 40 mg  40 mg  Oral  Daily  Curlene Labrum Yvonna Brun, MD   40 mg at 11/05/11 0759   .  haloperidol (HALDOL) tablet 15 mg  15 mg  Oral  QHS  Curlene Labrum Geovanni Rahming, MD   15 mg at 11/04/11 2151   .  hydrOXYzine (ATARAX/VISTARIL) tablet 25 mg  25 mg  Oral  Q4H PRN  Verne Spurr, PA-C   25 mg at 11/02/11 1452   .  magnesium hydroxide (MILK OF MAGNESIA) suspension 30 mL  30 mL  Oral  Daily PRN  Mike Craze, MD     .  traZODone (DESYREL) tablet 100 mg  100 mg  Oral  QHS PRN  Ronny Bacon, MD   100 mg at 10/29/11 2148    Lab Results: No results found for this or any previous visit (from the past 48 hour(s)).   Discharge Diagnoses:   AXIS I:   Schizoaffective Disorder - Depressive Type.   AXIS II:   Deferred. AXIS III:   1. Hypertension. AXIS IV:   Chronic Mental Illness.  Unemployment.   Corporate treasurer. AXIS V:   GAF at time of admission approximately 35.  GAF at time of discharge approximately 50.  Cognitive Features That Contribute To Risk:  Thought constriction (tunnel vision)    Physical Findings:  AIMS: Facial and Oral Movements  Muscles of Facial Expression: None, normal  Lips and Perioral Area: None, normal  Jaw: None, normal  Tongue: None, normal,Extremity Movements  Upper (arms, wrists, hands, fingers): None, normal  Lower (legs, knees, ankles, toes): None, normal, Trunk Movements  Neck, shoulders, hips: None, normal, Overall Severity  Severity of abnormal movements (highest score from questions above): None, normal  Incapacitation due to abnormal movements: None, normal  Patient's awareness of abnormal movements (rate only patient's report): No Awareness, Dental Status  Current problems with teeth and/or dentures?: No  Does patient usually wear dentures?: No   Review of Systems:  Neurological: No headaches, seizures or dizziness reported.  G.I.: The patient denies any constipation or stomach upset today.  Musculoskeletal: The patient denies any musculoskeletal issues today.   Time was spent today discussing with the patient his current symptoms. The patient reports to continuing to sleep well last night and reports that his appetite is also good. He reports mild feelings of sadness, anhedonia and depressed mood as well as mild anxiety today. He adamantly denies any suicidal ideations today as well as homicidal ideations.  He reports ongoing resolution of his auditory hallucinations and denies any visual hallucinations or delusional thinking. The patient denies any medication related side effects or other concerns today and is requesting discharge home.  The patient's Mother has also been contacted for collateral information and feels the patient is at baseline and ready for discharge.  Treatment Plan Summary:  1. Daily contact with patient to assess and  evaluate symptoms and progress in treatment.  2. Medication management  3. The patient will deny suicidal ideations or homicidal ideations for 48 hours prior to discharge and have a depression and anxiety rating of 3 or less. The patient will also deny any auditory or visual hallucinations or delusional thinking.  4. The patient will deny any symptoms of substance withdrawal at time of discharge.   Plan:  1. Will continue the medication Haldol at 15 mgs po qhs to continue to address his psychosis.  2. Will continue the patient on the medication Cogentin at 1 mg po qhs for EPS.  3. Will continue the medication Celexa to 40 mgs po q am for depression and anxiety.  4. Will continue the medication Trazodone at 100 mgs po qhs - prn for sleep.  5. Laboratory Studies reviewed.  6. Will continue to monitor.  7. The patient will be discharged today per his request to outpatient follow up.  Suicide Risk:  Minimal: No  identifiable suicidal ideation.  Patients presenting with no risk factors but with morbid ruminations; may be classified as minimal risk based on the severity of the depressive symptoms  Plan Of Care/Follow-up recommendations:  Activity:  As tolerated. Diet:  Heart Healthy Diet. Other:  Please take all medications only as directed and keep all scheduled follow up appointments.  Please return to your local Emergency Department should you have any thoughts of harming yourself or others.  Krysti Hickling 11/06/2011, 11:12 AM

## 2011-11-07 NOTE — Progress Notes (Signed)
Patient Discharge Instructions:  After Visit Summary (AVS):   Faxed to:  11/07/2011 Psychiatric Admission Assessment Note:   Faxed to:  11/07/2011 Suicide Risk Assessment - Discharge Assessment:   Faxed to:  11/07/2011 Faxed/Sent to the Next Level Care provider:  11/07/2011  Faxed to [facility] [place] @ [phone]  Wandra Scot, 11/07/2011, 7:30 PM

## 2011-11-17 ENCOUNTER — Encounter (HOSPITAL_COMMUNITY): Payer: Self-pay | Admitting: *Deleted

## 2011-11-17 ENCOUNTER — Ambulatory Visit (HOSPITAL_COMMUNITY)
Admission: AD | Admit: 2011-11-17 | Discharge: 2011-11-17 | Disposition: A | Discharge status: Home / Self Care | Payer: Medicaid Other | Attending: Psychiatry | Admitting: Psychiatry

## 2011-11-17 ENCOUNTER — Emergency Department (HOSPITAL_COMMUNITY)
Admission: EM | Admit: 2011-11-17 | Discharge: 2011-11-18 | Disposition: A | Discharge status: Home / Self Care | Payer: Medicaid Other | Attending: Emergency Medicine | Admitting: Emergency Medicine

## 2011-11-17 DIAGNOSIS — F200 Paranoid schizophrenia: Secondary | ICD-10-CM | POA: Insufficient documentation

## 2011-11-17 DIAGNOSIS — I10 Essential (primary) hypertension: Secondary | ICD-10-CM | POA: Insufficient documentation

## 2011-11-17 DIAGNOSIS — F259 Schizoaffective disorder, unspecified: Secondary | ICD-10-CM | POA: Insufficient documentation

## 2011-11-17 DIAGNOSIS — F17200 Nicotine dependence, unspecified, uncomplicated: Secondary | ICD-10-CM | POA: Insufficient documentation

## 2011-11-17 DIAGNOSIS — F251 Schizoaffective disorder, depressive type: Secondary | ICD-10-CM

## 2011-11-17 LAB — COMPREHENSIVE METABOLIC PANEL
Albumin: 4.2 g/dL (ref 3.5–5.2)
BUN: 9 mg/dL (ref 6–23)
Creatinine, Ser: 0.88 mg/dL (ref 0.50–1.35)
Total Protein: 7.3 g/dL (ref 6.0–8.3)

## 2011-11-17 LAB — CBC
HCT: 42 % (ref 39.0–52.0)
MCHC: 34.3 g/dL (ref 30.0–36.0)
MCV: 87.5 fL (ref 78.0–100.0)
RDW: 14.2 % (ref 11.5–15.5)

## 2011-11-17 LAB — ETHANOL: Alcohol, Ethyl (B): <11 mg/dL (ref 0–11)

## 2011-11-17 LAB — RAPID URINE DRUG SCREEN, HOSP PERFORMED
Benzodiazepines: NOT DETECTED
Cocaine: NOT DETECTED

## 2011-11-17 LAB — ACETAMINOPHEN LEVEL: Acetaminophen (Tylenol), Serum: <15 ug/mL (ref 10–30)

## 2011-11-17 LAB — SALICYLATE LEVEL: Salicylate Lvl: <2 mg/dL — ABNORMAL LOW (ref 2.8–20.0)

## 2011-11-17 NOTE — ED Notes (Signed)
Per pt and mother at bedside,  Pt was in at [facility] last week and he was put on four new medications and he now feels worse,  He can't sit still, feels nervous,  Pt is pacing,  Pt denies SI and HI
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
## 2011-11-17 NOTE — [facility] Counselor (Signed)
Telepsych consult with Dr. Trisha Mangle completed.

## 2011-11-17 NOTE — [facility] Counselor (Signed)
Telepsych consult ordered and info faxed to [facility]. CSR sts MD will call back within 1 hr. 

## 2011-11-18 MED ORDER — CITALOPRAM HYDROBROMIDE 20 MG PO TABS: 20.0000 mg | ORAL_TABLET | ORAL | Status: DC

## 2011-11-18 MED ORDER — BENZTROPINE MESYLATE 1 MG PO TABS: 2.0000 mg | ORAL_TABLET | Freq: Two times a day (BID) | ORAL | Status: DC

## 2011-11-18 MED ORDER — HALOPERIDOL 2 MG PO TABS: 2.0000 mg | ORAL_TABLET | Freq: Two times a day (BID) | ORAL | Status: DC

## 2011-11-18 NOTE — Discharge Summary (Signed)
Physician Discharge Summary Note  Patient:  Bryan [name] is an 36 y.o., male MRN:  [id number] DOB:  01-May-1975 Patient phone:  [phone] (home)  Patient address:   [address] [place] [address]   Date of Admission:  10/27/2011 Date of Discharge: 11/06/2011  Discharge Diagnoses: Principal Problem:  *Schizoaffective disorder, depressive type Active Problems:  Illiterate  Axis Diagnosis:  AXIS I: Schizoaffective Disorder - Depressive Type.  AXIS II: Deferred.  AXIS III: 1. Hypertension.  AXIS IV: Chronic Mental Illness. Unemployment. Corporate treasurer.  AXIS V: GAF at time of admission approximately 35. GAF at time of discharge approximately 50.   Level of Care:  Inpatient Hospitalization.  Reason for Admission: The patient presented to the [facility] ED requesting help with his mental illness. He states he has been having worsening symptoms which have been going on for years. He states that he wanders around a lot outside and walks until he is worn out. He often ends up in places he has no business being in. He often wonders how he got where he is. He has a long standing history of schizophrenia for which he has prescription medication, but he doesn't take it.  Hospital Course:   The patient attended treatment team meeting this am and met with treatment team members. The patient's symptoms, treatment plan and response to treatment was discussed. The patient endorsed that their symptoms have improved. The patient also stated that they felt stable for discharge.  They reported that from this hospital stay they had learned many coping skills.  In other to maintain their psychiatric stability, they will continue psychiatric care on an outpatient basis. They will follow-up as outlined below.  In addition they were instructed  to take all your medications as prescribed by their mental healthcare provider and to report any adverse effects and or reactions from your medicines to their  outpatient provider promptly.  The patient is also instructed and cautioned to not engage in alcohol and or illegal drug use while on prescription medicines.  In the event of worsening symptoms the patient is instructed to call the crisis hotline, 911 and or go to the nearest ED for appropriate evaluation and treatment of symptoms.   Also while a patient in this hospital, the patient received medication management for his psychiatric symptoms. They were ordered and received as outlined below:    Medication List     As of 11/18/2011  9:38 PM    STOP taking these medications         QUEtiapine Fumarate 150 MG 24 hr tablet   Commonly known as: SEROQUEL XR      TAKE these medications      Indication    traZODone 100 MG tablet   Commonly known as: DESYREL   Take 1 tablet (100 mg total) by mouth at bedtime as needed for sleep.        They were also enrolled in group counseling sessions and activities in which they participated actively.       Follow-up Information    Follow up with [facility]. On 11/12/2011. (Appt with Breck Coons, LPC at 2pm.)    Contact information:   [address]  [place], [place] [address] [phone] fax [phone]        Upon discharge, patient adamantly denies suicidal, homicidal ideations, auditory, visual hallucinations and or delusional thinking. They left [facility] with all personal belongings via personal transportation in no apparent distress.  Consults:  Please see  electronic medical record for details.  Significant Diagnostic Studies:  Please see electronic medical record for details.  Discharge Vitals:   Blood pressure 118/74, pulse 110, temperature 98.5 F (36.9 C), temperature source Oral, resp. rate 19, height 5\' 9"  (1.753 m), weight 85.73 kg (189 lb)..  Mental Status Exam: Demographic Factors:  Male, Adolescent or young adult, Low socioeconomic status and Unemployed  Mental Status Per Nursing Assessment::  On  Admission:  At time of Discharge: Time was spent today discussing with the patient his current symptoms. The patient reports to continuing to sleep well last night and reports that his appetite is also good. He reports mild feelings of sadness, anhedonia and depressed mood as well as mild anxiety today. He adamantly denies any suicidal ideations today as well as homicidal ideations. He reports ongoing resolution of his auditory hallucinations and denies any visual hallucinations or delusional thinking. The patient denies any medication related side effects or other concerns today and is requesting discharge home. The patient's Mother has also been contacted for collateral information and feels the patient is at baseline and ready for discharge.  Current Mental Status Per Physician:  Diagnosis:  Axis I: Schizoaffective Disorder - Depressive Type.  The patient was seen today and reports the following:  ADL's: Intact.  Sleep: The patient reports to sleeping well at night.  Appetite: The patient reports that his appetite is good today.  Mild>(1-10) >Severe  Hopelessness (1-10): 2-3  Depression (1-10): 2-3  Anxiety (1-10): 2-3  Suicidal Ideation: The patient adamantly denies any suicidal ideations today.  Plan: No  Intent: No  Means: No  Homicidal Ideation: The patient adamantly denies any homicidal ideations today.  Plan: No  Intent: No.  Means: No  General Appearance/Behavior: The patient remains friendly and cooperative today with this provider.  Eye Contact: Good.  Speech: Appropriate in rate and volume with no pressuring of speech noted today.  Motor Behavior: wnl.  Level of Consciousness: Alert and Oriented x 3.  Mental Status: Alert and Oriented x 3.  Mood: Mildly depressed.  Affect: Mild to moderately constricted.  Anxiety Level: Mild anxiety reported today.  Thought Process: wnl.  Thought Content: The patient denies any auditory or visual hallucinations today as well as any  delusions thinking.  Perception:. wnl.  Judgment: Good.  Insight: Good.  Cognition: Oriented to person, place and time.  Loss Factors:  No acute losses.  Historical Factors:  Long history of mental illness.  Risk Reduction Factors:  Good family support. Good access to healthcare.  Continued Clinical Symptoms:  Depression: Anhedonia  Previous Psychiatric Diagnoses and Treatments  Medical Diagnoses and Treatments/Surgeries  Current Medications:  Current Facility-Administered Medications   Medication  Dose  Route  Frequency  Provider  Last Rate  Last Dose   .  acetaminophen (TYLENOL) tablet 650 mg  650 mg  Oral  Q6H PRN  Mike Craze, MD     .  alum & mag hydroxide-simeth (MAALOX/MYLANTA) 200-200-20 MG/5ML suspension 30 mL  30 mL  Oral  Q4H PRN  Mike Craze, MD     .  benztropine (COGENTIN) tablet 1 mg  1 mg  Oral  QHS  Curlene Labrum Suesan Mohrmann, MD   1 mg at 11/04/11 2153   .  citalopram (CELEXA) tablet 40 mg  40 mg  Oral  Daily  Curlene Labrum Caran Storck, MD   40 mg at 11/05/11 0759   .  haloperidol (HALDOL) tablet 15 mg  15 mg  Oral  QHS  Curlene Labrum Brax Walen, MD   15 mg at 11/04/11 2151   .  hydrOXYzine (ATARAX/VISTARIL) tablet 25 mg  25 mg  Oral  Q4H PRN  Verne Spurr, PA-C   25 mg at 11/02/11 1452   .  magnesium hydroxide (MILK OF MAGNESIA) suspension 30 mL  30 mL  Oral  Daily PRN  Mike Craze, MD     .  traZODone (DESYREL) tablet 100 mg  100 mg  Oral  QHS PRN  Ronny Bacon, MD   100 mg at 10/29/11 2148   Lab Results: No results found for this or any previous visit (from the past 48 hour(s)).  Discharge Diagnoses:  AXIS I: Schizoaffective Disorder - Depressive Type.  AXIS II: Deferred.  AXIS III: 1. Hypertension.  AXIS IV: Chronic Mental Illness. Unemployment. Corporate treasurer.  AXIS V: GAF at time of admission approximately 35. GAF at time of discharge approximately 50.  Cognitive Features That Contribute To Risk:  Thought constriction (tunnel vision)  Physical Findings:  AIMS:  Facial and Oral Movements  Muscles of Facial Expression: None, normal  Lips and Perioral Area: None, normal  Jaw: None, normal  Tongue: None, normal,Extremity Movements  Upper (arms, wrists, hands, fingers): None, normal  Lower (legs, knees, ankles, toes): None, normal, Trunk Movements  Neck, shoulders, hips: None, normal, Overall Severity  Severity of abnormal movements (highest score from questions above): None, normal  Incapacitation due to abnormal movements: None, normal  Patient's awareness of abnormal movements (rate only patient's report): No Awareness, Dental Status  Current problems with teeth and/or dentures?: No  Does patient usually wear dentures?: No  Review of Systems:  Neurological: No headaches, seizures or dizziness reported.  G.I.: The patient denies any constipation or stomach upset today.  Musculoskeletal: The patient denies any musculoskeletal issues today.  Time was spent today discussing with the patient his current symptoms. The patient reports to continuing to sleep well last night and reports that his appetite is also good. He reports mild feelings of sadness, anhedonia and depressed mood as well as mild anxiety today. He adamantly denies any suicidal ideations today as well as homicidal ideations. He reports ongoing resolution of his auditory hallucinations and denies any visual hallucinations or delusional thinking. The patient denies any medication related side effects or other concerns today and is requesting discharge home. The patient's Mother has also been contacted for collateral information and feels the patient is at baseline and ready for discharge.  Treatment Plan Summary:  1. Daily contact with patient to assess and evaluate symptoms and progress in treatment.  2. Medication management  3. The patient will deny suicidal ideations or homicidal ideations for 48 hours prior to discharge and have a depression and anxiety rating of 3 or less. The patient will  also deny any auditory or visual hallucinations or delusional thinking.  4. The patient will deny any symptoms of substance withdrawal at time of discharge.  Plan:  1. Will continue the medication Haldol at 15 mgs po qhs to continue to address his psychosis.  2. Will continue the patient on the medication Cogentin at 1 mg po qhs for EPS.  3. Will continue the medication Celexa to 40 mgs po q am for depression and anxiety.  4. Will continue the medication Trazodone at 100 mgs po qhs - prn for sleep.  5. Laboratory Studies reviewed.  6. Will continue to monitor.  7. The patient will be discharged today per his request to outpatient follow up.  Suicide Risk:  Minimal: No identifiable suicidal ideation. Patients presenting with no risk factors but with morbid ruminations; may be classified as minimal risk based on the severity of the depressive symptoms  Plan Of Care/Follow-up recommendations:  Activity: As tolerated.  Diet: Heart Healthy Diet.  Other: Please take all medications only as directed and keep all scheduled follow up appointments. Please return to your local Emergency Department should you have any thoughts of harming yourself or others.  Discharge destination:  ALF  Is patient on multiple antipsychotic therapies at discharge:  No  Has Patient had three or more failed trials of antipsychotic monotherapy by history: N/A Recommended Plan for Multiple Antipsychotic Therapies: N/A Discharge Orders    Future Orders Please Complete By Expires   Diet - low sodium heart healthy      Increase activity slowly      Discharge instructions      Comments:   Please take all medications only as directed and keep all scheduled follow up appointments.  Please return to your local Emergency Room should you have any thoughts of harming yourself or others.       Medication List     As of 11/18/2011  9:38 PM    STOP taking these medications         QUEtiapine Fumarate 150 MG 24 hr tablet    Commonly known as: SEROQUEL XR      TAKE these medications      Indication    traZODone 100 MG tablet   Commonly known as: DESYREL   Take 1 tablet (100 mg total) by mouth at bedtime as needed for sleep.            Follow-up Information    Follow up with [facility]. On 11/12/2011. (Appt with Breck Coons, LPC at 2pm.)    Contact information:   [address]  [place], [place] [address] [phone] fax [phone]        Follow-up recommendations:   Activities: Resume typical activities Diet: Resume typical diet Other: Follow up with outpatient provider and report any side effects to out patient prescriber.  Comments:  Take all your medications as prescribed by your mental healthcare provider. Report any adverse effects and or reactions from your medicines to your outpatient provider promptly. Patient is instructed and cautioned to not engage in alcohol and or illegal drug use while on prescription medicines. In the event of worsening symptoms, patient is instructed to call the crisis hotline, 911 and or go to the nearest ED for appropriate evaluation and treatment of symptoms. Follow-up with your primary care provider for your other medical issues, concerns and or health care needs.  SignedFranchot Gallo 11/18/2011 9:38 PM

## 2011-11-18 NOTE — ED Provider Notes (Signed)
History     CSN: [id number]  Arrival date & time 11/17/11  1846   First MD Initiated Contact with Patient 11/17/11 2055      Chief Complaint  Patient presents with  . Medical Clearance    (Consider location/radiation/quality/duration/timing/severity/associated sxs/prior treatment) HPI  36 y.o. male recently seen and discharged from repair of a psychotic break. Patient is brought in by his mother complaining of acceptable side effects of new regimen of psychiatric medications. Patient feels that she nervous denies hallucinations, suicidal ideation/homicidal ideation, alcohol or drug abuse. Patient denies any physical complaints at this time.  Past Medical History  Diagnosis Date  . Tremors of nervous system   . Psychiatric problem     Seen at [facility] for unknown reason. Takes seroquel. History of hearing voices.  Not commandivng voices.   . Hypertension     Past Surgical History  Procedure Date  . None     History reviewed. No pertinent family history.  History  Substance Use Topics  . Smoking status: Current Every Day Smoker -- 0.5 packs/day    Types: Cigarettes  . Smokeless tobacco: Not on file  . Alcohol Use: Yes     says drinks a 40 at least once a week. denies drink in 2-3 weeks.       Review of Systems  Constitutional: Negative for fever.  Respiratory: Negative for shortness of breath.   Cardiovascular: Negative for chest pain.  Gastrointestinal: Negative for nausea, vomiting, abdominal pain and diarrhea.  All other systems reviewed and are negative.    Allergies  Review of patient's allergies indicates no known allergies.  Home Medications   Current Outpatient Rx  Name Route Sig Dispense Refill  . BENZTROPINE MESYLATE 1 MG PO TABS Oral Take 1 tablet (1 mg total) by mouth at bedtime. For possible EPS. 30 tablet 0  . CITALOPRAM HYDROBROMIDE 40 MG PO TABS Oral Take 1 tablet (40 mg total) by mouth daily. For depression and anxiety. 30 tablet 0  .  HALOPERIDOL 5 MG PO TABS Oral Take 3 tablets (15 mg total) by mouth at bedtime. For auditory hallucinations. 90 tablet 0  . TRAZODONE HCL 100 MG PO TABS Oral Take 1 tablet (100 mg total) by mouth at bedtime as needed for sleep. 30 tablet 0    There were no vitals taken for this visit.  Physical Exam  Nursing note and vitals reviewed. Constitutional: He is oriented to person, place, and time. He appears well-developed and well-nourished. No distress.  HENT:  Head: Normocephalic and atraumatic.  Mouth/Throat: Oropharynx is clear and moist.  Eyes: Conjunctivae normal and EOM are normal. Pupils are equal, round, and reactive to light.  Cardiovascular: Normal rate, regular rhythm and intact distal pulses.   Pulmonary/Chest: Effort normal. No stridor.  Abdominal: Soft. Bowel sounds are normal.  Musculoskeletal: Normal range of motion.  Neurological: He is alert and oriented to person, place, and time.  Psychiatric: He has a normal mood and affect.    ED Course  Procedures (including critical care time)  Labs Reviewed  COMPREHENSIVE METABOLIC PANEL - Abnormal; Notable for the following:    Sodium 134 (*)     Potassium 3.4 (*)     Chloride 95 (*)     Glucose, Bld 126 (*)     All other components within normal limits  SALICYLATE LEVEL - Abnormal; Notable for the following:    Salicylate Lvl <2.0 (*)     All other components within normal limits  CBC  ETHANOL  URINE RAPID DRUG SCREEN (HOSP PERFORMED)  ACETAMINOPHEN LEVEL   No results found.   1. Schizoaffective disorder, depressive type       MDM  Psych consult recommends changing medications as follows. Decreasing Haldol 22 makes by mouth twice a day decreasing Celexa 20 medics by mouth in the morning increasing Cogentin 22 medics by mouth twice a day and continuing trazodone at 100 mg by mouth before bedtime.         Wynetta Emery, PA-C 11/18/11 0031

## 2011-11-18 NOTE — ED Provider Notes (Signed)
Medical screening examination/treatment/procedure(s) were performed by non-physician practitioner and as supervising physician I was immediately available for consultation/collaboration.  Sunnie Nielsen, MD 11/18/11 [phone]

## 2013-01-11 ENCOUNTER — Emergency Department (HOSPITAL_COMMUNITY)
Admission: EM | Admit: 2013-01-11 | Discharge: 2013-01-11 | Disposition: A | Discharge status: Home / Self Care | Payer: Medicaid Other | Attending: Emergency Medicine | Admitting: Emergency Medicine

## 2013-01-11 ENCOUNTER — Encounter (HOSPITAL_COMMUNITY): Payer: Self-pay | Admitting: Emergency Medicine

## 2013-01-11 DIAGNOSIS — Z79899 Other long term (current) drug therapy: Secondary | ICD-10-CM | POA: Insufficient documentation

## 2013-01-11 DIAGNOSIS — F17200 Nicotine dependence, unspecified, uncomplicated: Secondary | ICD-10-CM | POA: Insufficient documentation

## 2013-01-11 DIAGNOSIS — I10 Essential (primary) hypertension: Secondary | ICD-10-CM | POA: Insufficient documentation

## 2013-01-11 DIAGNOSIS — Z8659 Personal history of other mental and behavioral disorders: Secondary | ICD-10-CM | POA: Insufficient documentation

## 2013-01-11 DIAGNOSIS — R55 Syncope and collapse: Secondary | ICD-10-CM | POA: Insufficient documentation

## 2013-01-11 LAB — CBC
HCT: 40.4 % (ref 39.0–52.0)
Hemoglobin: 14.2 g/dL (ref 13.0–17.0)
MCH: 31.4 pg (ref 26.0–34.0)
MCHC: 35.1 g/dL (ref 30.0–36.0)
MCV: 89.4 fL (ref 78.0–100.0)
Platelets: 244 10*3/uL (ref 150–400)
RBC: 4.52 MIL/uL (ref 4.22–5.81)
RDW: 14.3 % (ref 11.5–15.5)
WBC: 6.2 10*3/uL (ref 4.0–10.5)

## 2013-01-11 LAB — URINALYSIS, ROUTINE W REFLEX MICROSCOPIC
Bilirubin Urine: NEGATIVE
Glucose, UA: NEGATIVE mg/dL
Hgb urine dipstick: NEGATIVE
Ketones, ur: NEGATIVE mg/dL
Leukocytes, UA: NEGATIVE
Nitrite: NEGATIVE
Protein, ur: NEGATIVE mg/dL
Specific Gravity, Urine: 1.011 (ref 1.005–1.030)
Urobilinogen, UA: 2 mg/dL — ABNORMAL HIGH (ref 0.0–1.0)
pH: 6 (ref 5.0–8.0)

## 2013-01-11 LAB — ETHANOL: Alcohol, Ethyl (B): <11 mg/dL (ref 0–11)

## 2013-01-11 LAB — BASIC METABOLIC PANEL
BUN: 11 mg/dL (ref 6–23)
CO2: 26 mEq/L (ref 19–32)
Calcium: 9.2 mg/dL (ref 8.4–10.5)
Chloride: 103 mEq/L (ref 96–112)
Creatinine, Ser: 0.95 mg/dL (ref 0.50–1.35)
GFR calc Af Amer: >90 mL/min (ref >90)
GFR calc non Af Amer: >90 mL/min (ref >90)
Glucose, Bld: 110 mg/dL — ABNORMAL HIGH (ref 70–99)
Potassium: 3.8 mEq/L (ref 3.5–5.1)
Sodium: 138 mEq/L (ref 135–145)

## 2013-01-11 MED ORDER — SODIUM CHLORIDE 0.9 % IV BOLUS (SEPSIS)
2000.0000 mL | Freq: Once | INTRAVENOUS | Status: AC
  Administered 2013-01-11: 1000 mL via INTRAVENOUS

## 2013-01-11 MED ORDER — SODIUM CHLORIDE 0.9 % IV BOLUS (SEPSIS): 1000.0000 mL | Freq: Once | INTRAVENOUS | Status: DC

## 2013-01-11 NOTE — ED Notes (Signed)
Pt arrives to ed via [facility] for sycopal episode 1 hr pta.  Mother sts pt lost consciousness "briefly".  Pt caox4, pmsx4, nad.  Pt denies recent illness/injury.  No obvious injury or deformity.  Pt denies pain anywhere.

## 2013-01-11 NOTE — ED Provider Notes (Signed)
CSN: [id number]     Arrival date & time 01/11/13  1329 History   First MD Initiated Contact with Patient 01/11/13 1332     Chief Complaint  Patient presents with  . Loss of Consciousness   (Consider location/radiation/quality/duration/timing/severity/associated sxs/prior Treatment) HPI Patient presents emergency department with a near syncopal episode.  The patient, states, that he went out to smoke a cigarette.  This morning when he woke back into the house.  He felt like he things went dark and he felt like he was going to pass out so he laid himself down on the couch.  The patient, states, that he did not completely pass out as he is aware of his surroundings, but came very close.  Patient denies chest pain, shortness of breath, nausea, vomiting, abdominal pain, headache, blurred vision, weakness, numbness, dizziness, fever, or back pain, neck pain, or fever.  The patient, states, that he did not take any of patient's prior to arrival. Past Medical History  Diagnosis Date  . Tremors of nervous system   . Psychiatric problem     Seen at [facility] for unknown reason. Takes seroquel. History of hearing voices.  Not commandivng voices.   . Hypertension    Past Surgical History  Procedure Laterality Date  . None     History reviewed. No pertinent family history. History  Substance Use Topics  . Smoking status: Current Every Day Smoker -- 0.50 packs/day    Types: Cigarettes  . Smokeless tobacco: Not on file  . Alcohol Use: Yes     Comment: says drinks a 40 at least once a week. denies drink in 2-3 weeks.     Review of Systems All other systems negative except as documented in the HPI. All pertinent positives and negatives as reviewed in the HPI. Allergies  Review of patient's allergies indicates no known allergies.  Home Medications   Current Outpatient Rx  Name  Route  Sig  Dispense  Refill  . atenolol (TENORMIN) 25 MG tablet   Oral   Take by mouth daily.         .  citalopram (CELEXA) 40 MG tablet   Oral   Take 40 mg by mouth daily.         . QUEtiapine (SEROQUEL) 300 MG tablet   Oral   Take 300 mg by mouth at bedtime.          BP 99/67  Pulse 69  Temp(Src) 98.4 F (36.9 C) (Oral)  Resp 20  Ht 6' (1.829 m)  Wt 190 lb (86.183 kg)  BMI 25.76 kg/m2  SpO2 98% Physical Exam  Nursing note and vitals reviewed. Constitutional: He is oriented to person, place, and time. He appears well-developed and well-nourished. No distress.  HENT:  Head: Normocephalic and atraumatic.  Mouth/Throat: Oropharynx is clear and moist.  Eyes: Pupils are equal, round, and reactive to light.  Neck: Normal range of motion. Neck supple.  Cardiovascular: Normal rate, regular rhythm and normal heart sounds.  Exam reveals no gallop and no friction rub.   No murmur heard. Pulmonary/Chest: Effort normal and breath sounds normal. No respiratory distress.  Abdominal: Soft. Bowel sounds are normal. He exhibits no distension. There is no tenderness.  Neurological: He is alert and oriented to person, place, and time. He exhibits normal muscle tone. Coordination normal.  Skin: Skin is warm and dry.    ED Course  Procedures (including critical care time)   Lala Lund PA-C will observe the patient and recheck  for possible discharge  Carlyle Dolly, PA-C 01/11/13 [phone]

## 2013-01-11 NOTE — ED Provider Notes (Signed)
Physical Exam  BP 106/70  Pulse 70  Temp(Src) 98.4 F (36.9 C) (Oral)  Resp 19  Ht 6' (1.829 m)  Wt 190 lb (86.183 kg)  BMI 25.76 kg/m2  SpO2 100%  Physical Exam  Nursing note and vitals reviewed. Constitutional: He is oriented to person, place, and time. He appears well-developed and well-nourished. No distress.  Patient is well-appearing. I walked patient around a pod and he continued to feel well.   HENT:  Head: Normocephalic and atraumatic.  Right Ear: External ear normal.  Left Ear: External ear normal.  Nose: Nose normal.  Eyes: Conjunctivae are normal.  Neck: Normal range of motion. No tracheal deviation present.  Cardiovascular: Normal rate, regular rhythm and normal heart sounds.   Pulmonary/Chest: Effort normal and breath sounds normal. No stridor.  Abdominal: Soft. He exhibits no distension. There is no tenderness.  Musculoskeletal: Normal range of motion.  Neurological: He is alert and oriented to person, place, and time. Gait normal.  Skin: Skin is warm and dry. He is not diaphoretic.  Psychiatric: He has a normal mood and affect. His behavior is normal.   Results for orders placed during the hospital encounter of 01/11/13  BASIC METABOLIC PANEL      Result Value Range   Sodium 138  135 - 145 mEq/L   Potassium 3.8  3.5 - 5.1 mEq/L   Chloride 103  96 - 112 mEq/L   CO2 26  19 - 32 mEq/L   Glucose, Bld 110 (*) 70 - 99 mg/dL   BUN 11  6 - 23 mg/dL   Creatinine, Ser 1.61  0.50 - 1.35 mg/dL   Calcium 9.2  8.4 - 09.6 mg/dL   GFR calc non Af Amer >90  >90 mL/min   GFR calc Af Amer >90  >90 mL/min  CBC      Result Value Range   WBC 6.2  4.0 - 10.5 K/uL   RBC 4.52  4.22 - 5.81 MIL/uL   Hemoglobin 14.2  13.0 - 17.0 g/dL   HCT 04.5  40.9 - 81.1 %   MCV 89.4  78.0 - 100.0 fL   MCH 31.4  26.0 - 34.0 pg   MCHC 35.1  30.0 - 36.0 g/dL   RDW 91.4  78.2 - 95.6 %   Platelets 244  150 - 400 K/uL  URINALYSIS, ROUTINE W REFLEX MICROSCOPIC      Result Value Range   Color, Urine YELLOW  YELLOW   APPearance CLEAR  CLEAR   Specific Gravity, Urine 1.011  1.005 - 1.030   pH 6.0  5.0 - 8.0   Glucose, UA NEGATIVE  NEGATIVE mg/dL   Hgb urine dipstick NEGATIVE  NEGATIVE   Bilirubin Urine NEGATIVE  NEGATIVE   Ketones, ur NEGATIVE  NEGATIVE mg/dL   Protein, ur NEGATIVE  NEGATIVE mg/dL   Urobilinogen, UA 2.0 (*) 0.0 - 1.0 mg/dL   Nitrite NEGATIVE  NEGATIVE   Leukocytes, UA NEGATIVE  NEGATIVE  ETHANOL      Result Value Range   Alcohol, Ethyl (B) <11  0 - 11 mg/dL     ED Course  Procedures  MDM Patient signed out to me by Lawyer, PA-C. Patient presents with near syncopal episode. All labs are unremarkable. Patient feels improved after 3L of fluid and may be discharged home. I walks patient around the Pod and he tolerated this quite well. Return instructions given. Vital signs stable for discharge. Patient / Family / Caregiver informed of clinical course, understand  medical decision-making process, and agree with plan.   Medications  sodium chloride 0.9 % bolus 1,000 mL (not administered)  sodium chloride 0.9 % bolus 2,000 mL (1,000 mLs Intravenous New Bag/Given 01/11/13 1507)         Mora Bellman, PA-C 01/11/13 1715

## 2013-01-13 NOTE — ED Provider Notes (Signed)
Medical screening examination/treatment/procedure(s) were performed by non-physician practitioner and as supervising physician I was immediately available for consultation/collaboration.  EKG Interpretation    Date/Time:    Ventricular Rate:  73 PR Interval:  148 QRS Duration: 85 QT Interval:  373 QTC Calculation: 411 R Axis:   50 Text Interpretation:  Normal sinus rhythm Left ventricular hypertrophy Early repolarization No old tracing to compare              [place] B. Bernette Mayers, MD 01/13/13 [phone]

## 2013-01-13 NOTE — ED Provider Notes (Signed)
History/physical exam/procedure(s) were performed by non-physician practitioner and as supervising physician I was immediately available for consultation/collaboration. I have reviewed all notes and am in agreement with care and plan.   Hilario Quarry, MD 01/13/13 [phone]

## 2017-02-16 ENCOUNTER — Emergency Department (HOSPITAL_COMMUNITY)
Admission: EM | Admit: 2017-02-16 | Discharge: 2017-02-18 | Disposition: A | Discharge status: Other Acute Inpatient Hospital | Payer: Medicaid Other | Attending: Emergency Medicine | Admitting: Emergency Medicine

## 2017-02-16 ENCOUNTER — Encounter (HOSPITAL_COMMUNITY): Payer: Self-pay

## 2017-02-16 DIAGNOSIS — Z046 Encounter for general psychiatric examination, requested by authority: Secondary | ICD-10-CM | POA: Diagnosis present

## 2017-02-16 DIAGNOSIS — F250 Schizoaffective disorder, bipolar type: Secondary | ICD-10-CM

## 2017-02-16 DIAGNOSIS — Z79899 Other long term (current) drug therapy: Secondary | ICD-10-CM | POA: Diagnosis not present

## 2017-02-16 DIAGNOSIS — F209 Schizophrenia, unspecified: Secondary | ICD-10-CM | POA: Insufficient documentation

## 2017-02-16 DIAGNOSIS — F259 Schizoaffective disorder, unspecified: Secondary | ICD-10-CM | POA: Diagnosis present

## 2017-02-16 DIAGNOSIS — F22 Delusional disorders: Secondary | ICD-10-CM | POA: Diagnosis not present

## 2017-02-16 DIAGNOSIS — F17210 Nicotine dependence, cigarettes, uncomplicated: Secondary | ICD-10-CM | POA: Insufficient documentation

## 2017-02-16 DIAGNOSIS — F1210 Cannabis abuse, uncomplicated: Secondary | ICD-10-CM | POA: Diagnosis not present

## 2017-02-16 DIAGNOSIS — F1410 Cocaine abuse, uncomplicated: Secondary | ICD-10-CM | POA: Diagnosis not present

## 2017-02-16 HISTORY — DX: Schizophrenia, unspecified: F20.9

## 2017-02-16 LAB — COMPREHENSIVE METABOLIC PANEL
ALT: 17 U/L (ref 17–63)
AST: 29 U/L (ref 15–41)
Albumin: 4.5 g/dL (ref 3.5–5.0)
Alkaline Phosphatase: 81 U/L (ref 38–126)
Anion gap: 10 (ref 5–15)
BILIRUBIN TOTAL: 1.4 mg/dL — AB (ref 0.3–1.2)
BUN: 13 mg/dL (ref 6–20)
CO2: 26 mmol/L (ref 22–32)
Calcium: 9.2 mg/dL (ref 8.9–10.3)
Chloride: 99 mmol/L — ABNORMAL LOW (ref 101–111)
Creatinine, Ser: 0.84 mg/dL (ref 0.61–1.24)
GFR calc Af Amer: >60 mL/min (ref >60)
GFR calc non Af Amer: >60 mL/min (ref >60)
Glucose, Bld: 122 mg/dL — ABNORMAL HIGH (ref 65–99)
POTASSIUM: 3.4 mmol/L — AB (ref 3.5–5.1)
Sodium: 135 mmol/L (ref 135–145)
TOTAL PROTEIN: 7.5 g/dL (ref 6.5–8.1)

## 2017-02-16 LAB — URINALYSIS, ROUTINE W REFLEX MICROSCOPIC
Bacteria, UA: NONE SEEN
Bilirubin Urine: NEGATIVE
Glucose, UA: NEGATIVE mg/dL
Ketones, ur: NEGATIVE mg/dL
Leukocytes, UA: NEGATIVE
Nitrite: NEGATIVE
Protein, ur: NEGATIVE mg/dL
SPECIFIC GRAVITY, URINE: 1.003 — AB (ref 1.005–1.030)
SQUAMOUS EPITHELIAL / LPF: NONE SEEN
pH: 5 (ref 5.0–8.0)

## 2017-02-16 LAB — CBC WITH DIFFERENTIAL/PLATELET
BASOS PCT: 0 %
Basophils Absolute: 0 10*3/uL (ref 0.0–0.1)
Eosinophils Absolute: 0 10*3/uL (ref 0.0–0.7)
Eosinophils Relative: 0 %
HCT: 39.1 % (ref 39.0–52.0)
Hemoglobin: 13.4 g/dL (ref 13.0–17.0)
Lymphocytes Relative: 34 %
Lymphs Abs: 1.8 10*3/uL (ref 0.7–4.0)
MCH: 31.1 pg (ref 26.0–34.0)
MCHC: 34.3 g/dL (ref 30.0–36.0)
MCV: 90.7 fL (ref 78.0–100.0)
MONO ABS: 0.6 10*3/uL (ref 0.1–1.0)
MONOS PCT: 12 %
NEUTROS ABS: 2.8 10*3/uL (ref 1.7–7.7)
NEUTROS PCT: 54 %
Platelets: 295 10*3/uL (ref 150–400)
RBC: 4.31 MIL/uL (ref 4.22–5.81)
RDW: 16.3 % — ABNORMAL HIGH (ref 11.5–15.5)
WBC: 5.3 10*3/uL (ref 4.0–10.5)

## 2017-02-16 LAB — RAPID URINE DRUG SCREEN, HOSP PERFORMED
AMPHETAMINES: NOT DETECTED
BARBITURATES: NOT DETECTED
Benzodiazepines: NOT DETECTED
COCAINE: NOT DETECTED
Opiates: NOT DETECTED
TETRAHYDROCANNABINOL: NOT DETECTED

## 2017-02-16 LAB — LIPASE, BLOOD: Lipase: 36 U/L (ref 11–51)

## 2017-02-16 LAB — ETHANOL

## 2017-02-16 MED ORDER — ACETAMINOPHEN 325 MG PO TABS
650.0000 mg | ORAL_TABLET | Freq: Four times a day (QID) | ORAL | Status: DC | PRN
  Administered 2017-02-16: 650 mg via ORAL
  Filled 2017-02-16: qty 2

## 2017-02-16 MED ORDER — ZIPRASIDONE MESYLATE 20 MG IM SOLR
20.0000 mg | Freq: Two times a day (BID) | INTRAMUSCULAR | Status: DC | PRN
  Administered 2017-02-17: 20 mg via INTRAMUSCULAR
  Filled 2017-02-16: qty 20

## 2017-02-16 MED ORDER — LORAZEPAM 1 MG PO TABS: 1.0000 mg | ORAL_TABLET | ORAL | Status: DC | PRN

## 2017-02-16 NOTE — ED Notes (Signed)
Pt is calm but slow to respond to request to change into burgundy scrubs.  He states he had been needing to "flush my system because he bought some white cheese and I'm allergic to it.  I feel it in my pelvis and need to flush it out."  Asked if he's been constipated and he said he was.  Asked when his LBM was and he said "not any today" but denied that it's been longer than a day or two.   When he finally did go to the bathroom to change into the scrubs, he kept going to the soap dispenser and put soap onto all of this belongings, his arms, face, and hair.  When he was reminded that he was putting soap onto his face, he said he knew and would wash it off but he never did.

## 2017-02-16 NOTE — ED Notes (Signed)
Family at bedside. 

## 2017-02-16 NOTE — ED Notes (Signed)
SBAR Report received from previous nurse. Pt received calm and visible on unit. Pt denies current SI/ HI, A/V H, depression, anxiety, or pain at this time and appears otherwise stable and free of distress. Pt reminded of camera surveillance, q 15 min rounds, and rules of the milieu. Will continue to assess.

## 2017-02-16 NOTE — ED Notes (Signed)
Per pt's mother:  She is asking for his schizophrenic medication to be administered as an injection (states it's a Q 3 month injection) instead of getting oral medications because she says he doesn't like pills and that's why he stopped taking his medications.

## 2017-02-16 NOTE — ED Notes (Addendum)
Patient's mother is Gavin PoundDeborah.  She is the Emergency Contact.  Her number is [phone].  Contacted registration to update the info in Epic.

## 2017-02-16 NOTE — BH Assessment (Addendum)
Assessment Note  Bryan [name] is an 41 y.o. male that presents this date with IVC. Patient brought in by [employer], IVC was initiated by a concerned family member. Per IVC: "[name] has been off his medications for the past year and half, hasn't been eating, sleeping, or tending to his personal hygiene. Patient is hearing voices, has punched walls inside of home causing hands to bleed and him calling for help. There are pipes, bricks, and chains in his home. Family is afraid he'll hurt himself". [employer] states his apartment is "dark with evil images"  [employer] reports he was calm and cooperative in route. Patient presents this date with a flat affect but is cooperative. Patient is observed to laugh and cover his mouth as this writer attempts to conduct assessment. Patient denies contents of IVC or ever being diagnosed with any mental health disorder. Patient denies any current/past medication regimen or seeing any OP providers to assist with any mental health issues. Patient denies any S/I, H/I or AVH. Patient denies any history of inpatient admissions although per chart review patient [facility] for symptoms associated with Schizophrenia and medication non compliance. Limited history can be obtained on this patient per record review. Patient reports past alcohol use but will not elaborate on amount or time frame. Patient denies current use of any illicit substance. Patient states he presents this date due to "eating some bad cheese." Patient continues to ruminate on somatic issues associated with "bad cheese." Patient elaborates on pain in his pelvis and recent bowel movements that he feels is associated with "bad cheese." Patient continues to repeat "bad cheese" over and over. Patient is oriented to place only and renders limited history. Patient does not appear to be responding to internal stimuli. Patient denies any S/I, H/I or AVH. Information to complete assessment was obtained from admission notes  due to patient's current condition. Per note review, "Patient  is calm but slow to respond to request to change into burgundy scrubs. He states he had been needing to "flush my system because he bought some white cheese and I'm allergic to it.  I feel it in my pelvis and need to flush it out." Asked if he's been constipated and he said he was. When he finally did go to the bathroom to change into the scrubs, he kept going to the soap dispenser and put soap onto all of this belongings, his arms, face, and hair.  When he was reminded that he was putting soap onto his face, he said he knew and would wash it off but he never did. Case was staffed with Arville CareParks FNP who recommended a inpatient admission as appropriate bed placement is investigated.      Diagnosis: F20.3 Schizophrenia (per notes)   Past Medical History:  Past Medical History:  Diagnosis Date  . Hypertension   . Psychiatric problem    [facility]. Takes seroquel. History of hearing voices.  Not commandivng voices.   . Tremors of nervous system     Past Surgical History:  Procedure Laterality Date  . none      Family History: No family history on file.  Social History:  reports that he has been smoking cigarettes.  He has been smoking about 0.50 packs per day. He does not have any smokeless tobacco history on file. He reports that he drinks alcohol. He reports that he uses drugs. Drugs: Cocaine and Marijuana.  Additional Social History:  Alcohol / Drug  Use Pain Medications: See MAR Prescriptions: See MAR Over the Counter: See MAR History of alcohol / drug use?: Yes Longest period of sobriety (when/how long): Longest unknown; pt has been sober for the past 2 months. Negative Consequences of Use: (Denies) Withdrawal Symptoms: (Denies) Substance #1 Name of Substance 1: Alcohol 1 - Age of First Use: 22 1 - Amount (size/oz): Amounts vary per patient  1 - Frequency: Pt currently denies 1 - Duration:  Unknown 1 - Last Use / Amount: Unknown  CIWA: CIWA-Ar BP: (!) 156/114 Pulse Rate: (!) 108 COWS:    Allergies: No Known Allergies  Home Medications:  (Not in a hospital admission)  OB/GYN Status:  No LMP for male patient.  General Assessment Data [facility] of Assessment: [facility] T[facility] Assessment: In system Is this a Tele or Face-to-Face Assessment?: Face-to-Face Is this an Initial Assessment or a Re-assessment for this encounter?: Initial Assessment Marital status: Single Maiden name: NA Is patient pregnant?: No Pregnancy Status: No Living Arrangements: Alone Can pt return to current living arrangement?: Yes Admission Status: Involuntary Is patient capable of signing voluntary admission?: Yes Referral Source: Self/Family/Friend Insurance type: Self pay  Medical Screening Exam [facility] Walk-in ONLY) Medical Exam completed: Yes  Crisis Care Plan Living Arrangements: Alone Legal Guardian: (NA) Name of Psychiatrist: None noted Name of Therapist: None noted  Education Status Is patient currently in school?: No Current Grade: (NA) Highest grade of school patient has completed: (12 ) Name of school: (NA) Contact person: (NA)  Risk to self with the past 6 months Suicidal Ideation: No Has patient been a risk to self within the past 6 months prior to admission? : Yes(Per IVC) Suicidal Intent: No Has patient had any suicidal intent within the past 6 months prior to admission? : No Is patient at risk for suicide?: Yes Suicidal Plan?: No Has patient had any suicidal plan within the past 6 months prior to admission? : No Access to Means: No What has been your use of drugs/alcohol within the last 12 months?: Hx of alcohol use but currently denies Previous Attempts/Gestures: (UTA) How many times?: (UTA) Other Self Harm Risks: (UTA) Triggers for Past Attempts: Unknown Intentional Self Injurious Behavior: (Per IVC punching walls) Family Suicide History: Unknown Recent stressful  life event(s): Other (Comment) Persecutory voices/beliefs?: (Off medications) Depression: (UTA) Depression Symptoms: (UTA) Substance abuse history and/or treatment for substance abuse?: No Suicide prevention information given to non-admitted patients: Not applicable  Risk to Others within the past 6 months Homicidal Ideation: No Does patient have any lifetime risk of violence toward others beyond the six months prior to admission? : No Thoughts of Harm to Others: No Current Homicidal Intent: No Current Homicidal Plan: No Access to Homicidal Means: No Identified Victim: NA History of harm to others?: No Assessment of Violence: None Noted Violent Behavior Description: NA Does patient have access to weapons?: No Criminal Charges Pending?: No Does patient have a court date: No Is patient on probation?: No  Psychosis Hallucinations: Auditory(Per IVC) Delusions: None noted  Mental Status Report Appearance/Hygiene: In scrubs Eye Contact: Fair Motor Activity: Freedom of movement Speech: Soft, Slow Level of Consciousness: Quiet/awake Mood: Anxious Affect: Blunted Anxiety Level: Minimal Thought Processes: Thought Blocking Judgement: Unable to Assess Orientation: Unable to assess Obsessive Compulsive Thoughts/Behaviors: Unable to Assess  Cognitive Functioning Concentration: Unable to Assess Memory: Unable to Assess IQ: (UTA) Insight: Unable to Assess Impulse Control: Unable to Assess Appetite: (UTA) Weight Loss: (UTA) Weight Gain: (UTA) Sleep: (UTA) Total Hours of Sleep: (  UTA) Vegetative Symptoms: (UTA)  ADLScreening [facility] Assessment Services) Patient's cognitive ability adequate to safely complete daily activities?: Yes Patient able to express need for assistance with ADLs?: Yes Independently performs ADLs?: Yes (appropriate for developmental age)  Prior Inpatient Therapy Prior Inpatient Therapy: Yes Prior Therapy Dates: 2013 Prior Therapy Facilty/Provider(s):  [facility] Reason for Treatment: MH issues  Prior Outpatient Therapy Prior Outpatient Therapy: (UTA) Prior Therapy Dates: (NA) Prior Therapy Facilty/Provider(s): (UTA) Reason for Treatment: (UTA) Does patient have an ACCT team?: Unknown Does patient have Intensive In-House Services?  : Unknown Does patient have [facility] services? : Unknown Does patient have P4[facility] services?: Unknown  ADL Screening (condition at time of admission) Patient's cognitive ability adequate to safely complete daily activities?: Yes Is the patient deaf or have difficulty hearing?: No Does the patient have difficulty seeing, even when wearing glasses/contacts?: No Does the patient have difficulty concentrating, remembering, or making decisions?: No Patient able to express need for assistance with ADLs?: Yes Does the patient have difficulty dressing or bathing?: No Independently performs ADLs?: Yes (appropriate for developmental age) Does the patient have difficulty walking or climbing stairs?: No Weakness of Legs: None Weakness of Arms/Hands: None  Home Assistive Devices/Equipment Home Assistive Devices/Equipment: None  Therapy Consults (therapy consults require a physician order) PT Evaluation Needed: No OT Evalulation Needed: No SLP Evaluation Needed: No Abuse/Neglect Assessment (Assessment to be complete while patient is alone) Physical Abuse: Denies Verbal Abuse: Denies Sexual Abuse: Denies Exploitation of patient/patient's resources: Denies Self-Neglect: Denies Values / Beliefs Cultural Requests During Hospitalization: None Spiritual Requests During Hospitalization: None Consults Spiritual Care Consult Needed: No Social Work Consult Needed: No Merchant navy officer (For Healthcare) Does Patient Have a Medical Advance Directive?: No Would patient like information on creating a medical advance directive?: No - Patient declined    Additional Information 1:1 In Past 12 Months?: No CIRT Risk:  No Elopement Risk: No Does patient have medical clearance?: Yes     Disposition: Case was staffed with Arville Care FNP who recommended a inpatient admission as appropriate bed placement is investigated.  Disposition Initial Assessment Completed for this Encounter: Yes Disposition of Patient: Inpatient treatment program Type of inpatient treatment program: Adult  On Site Evaluation by:   Reviewed with Physician:    Alfredia Ferguson 02/16/2017 6:41 PM

## 2017-02-16 NOTE — ED Notes (Signed)
Bed: WA29 Expected date:  Expected time:  Means of arrival:  Comments: 

## 2017-02-16 NOTE — ED Triage Notes (Signed)
Pt brought in by [employer].  His mother took out IVC papers on him bc he has been "on/off his for the past year and half, hasn't been eating, sleeping, or tending to his personal hygiene."  Pt hearing voices, has punched walls inside of home causing hands to bleed and him calling for help.  There are pipes, bricks, and chains in his home.  Mom is afraid he'll hurt himself.  [employer] states his apt is "dark w/evil images"  [employer] reports he was calm and cooperative on the way over here.

## 2017-02-16 NOTE — BH Assessment (Signed)
[facility] Assessment Progress Note  Case was staffed with Parks FNP who recommended a inpatient admission as appropriate bed placement is investigated.     

## 2017-02-16 NOTE — ED Provider Notes (Signed)
[facility]-EMERGENCY DEPT Provider Note   CSN: [id number] Arrival date & time: 02/16/17  1625     History   Chief Complaint Chief Complaint  Patient presents with  . Medical Clearance    HPI Rosita Kearthur Micalizzi is a 41 y.o. male.  HPI Patient presents under involuntary commitment. Process executed by his mother. The patient himself has a history of schizophrenia, that he is unaware of, states that he is otherwise well. When asked why he is in the hospital he states that he is concerned that perhaps he ate the wrong kind of cheese, and is worried that the cheese is not passing through him appropriately. He perseverates on the type of cheese, the idea that he may be in the wrong version of cheese, though it is not clear if he has eaten any cheese at all. He denies physical pain other than some abdominal discomfort he attributes to eating the cheese. He denies vomiting, diarrhea. He states that he is here so that someone can assist with his drinking fluids to wash that she is from his system. He denies hallucination, denies suicidal ideation. He also denies knowledge of illness, though he does seemingly acknowledge previously being prescribed medication.  Past Medical History:  Diagnosis Date  . Hypertension   . Psychiatric problem    [facility]. Takes seroquel. History of hearing voices.  Not commandivng voices.   . Tremors of nervous system     Patient Active Problem List   Diagnosis Date Noted  . Illiterate 10/30/2011  . Schizoaffective disorder, depressive type (HCC) 10/27/2011  . Schizoaffective disorder (HCC) 08/10/2011  . Observed seizure-like activity (HCC) 08/09/2011  . PAIN IN JOINT, MULTIPLE SITES 02/16/2010  . FATIGUE 02/16/2010  . Shortness of breath 02/16/2010    Past Surgical History:  Procedure Laterality Date  . none         Home Medications    Prior to Admission medications   Medication Sig Start  Date End Date Taking? Authorizing Provider  atenolol (TENORMIN) 25 MG tablet Take by mouth daily.    [provider]  citalopram (CELEXA) 40 MG tablet Take 40 mg by mouth daily.    [provider]  QUEtiapine (SEROQUEL) 300 MG tablet Take 300 mg by mouth at bedtime.    [provider]    Family History No family history on file.  Social History Social History   Tobacco Use  . Smoking status: Current Every Day Smoker    Packs/day: 0.50    Types: Cigarettes  Substance Use Topics  . Alcohol use: Yes    Comment: says drinks a 40 at least once a week. denies drink in 2-3 weeks.   . Drug use: Yes    Types: Cocaine, Marijuana    Comment: marijuana somewhat regularly     Allergies   Patient has no known allergies.   Review of Systems Review of Systems  Unable to perform ROS: Psychiatric disorder     Physical Exam Updated Vital Signs BP (!) 156/114 (BP Location: Left Arm)   Pulse (!) 108   Temp 98.1 F (36.7 C) (Oral)   Resp 18   SpO2 100%   Physical Exam  Constitutional: He appears well-developed. No distress.  HENT:  Head: Normocephalic and atraumatic.  Eyes: Conjunctivae and EOM are normal.  Cardiovascular: Normal rate and regular rhythm.  Pulmonary/Chest: Effort normal. No stridor. No respiratory distress.  Abdominal: He exhibits no distension.  Musculoskeletal: He exhibits no  edema.  Neurological: He is alert. He displays no atrophy and no tremor. He exhibits normal muscle tone. He displays no seizure activity.  Skin: Skin is warm and dry.  Psychiatric: His speech is tangential. Thought content is delusional. Cognition and memory are impaired.  Nursing note and vitals reviewed.    ED Treatments / Results  Labs (all labs ordered are listed, but only abnormal results are displayed) Labs Reviewed  COMPREHENSIVE METABOLIC PANEL - Abnormal; Notable for the following components:      Result Value   Potassium 3.4 (*)    Chloride 99  (*)    Glucose, Bld 122 (*)    Total Bilirubin 1.4 (*)    All other components within normal limits  CBC WITH DIFFERENTIAL/PLATELET - Abnormal; Notable for the following components:   RDW 16.3 (*)    All other components within normal limits  URINALYSIS, ROUTINE W REFLEX MICROSCOPIC - Abnormal; Notable for the following components:   Color, Urine STRAW (*)    Specific Gravity, Urine 1.003 (*)    Hgb urine dipstick SMALL (*)    All other components within normal limits  ETHANOL  LIPASE, BLOOD  RAPID URINE DRUG SCREEN, HOSP PERFORMED    Procedures Procedures (including critical care time)  Medications Ordered in ED Medications  ziprasidone (GEODON) injection 20 mg (not administered)    And  LORazepam (ATIVAN) tablet 1 mg (not administered)     Initial Impression / Assessment and Plan / ED Course  I have reviewed the triage vital signs and the nursing notes.  Pertinent labs & imaging results that were available during my care of the patient were reviewed by me and considered in my medical decision making (see chart for details).  Young male with history of schizophrenia, reportedly no recent medication use presents under involuntary commitment papers due to reported aggressive behavior, and with delusional thought and speech pattern. Patient is awake and alert, though not on helpful in terms of providing details of today's evaluation. The patient's speech pattern suggestive of delusion, consistent with his history of schizophrenia. Patient was medically cleared for psychiatric evaluation.  Final Clinical Impressions(s) / ED Diagnoses  Psychosis   Gerhard MunchLockwood, Merle Cirelli, MD 02/16/17 [id number]

## 2017-02-16 NOTE — ED Notes (Signed)
Patient spoke with staff concerning the results of his lab work.  He wants to know if the results have come back to determine the effects of the "white cheese" that he ate.

## 2017-02-17 DIAGNOSIS — F22 Delusional disorders: Secondary | ICD-10-CM

## 2017-02-17 DIAGNOSIS — F1410 Cocaine abuse, uncomplicated: Secondary | ICD-10-CM

## 2017-02-17 DIAGNOSIS — R443 Hallucinations, unspecified: Secondary | ICD-10-CM | POA: Diagnosis not present

## 2017-02-17 DIAGNOSIS — F1210 Cannabis abuse, uncomplicated: Secondary | ICD-10-CM

## 2017-02-17 DIAGNOSIS — R450 Nervousness: Secondary | ICD-10-CM | POA: Diagnosis not present

## 2017-02-17 DIAGNOSIS — F17210 Nicotine dependence, cigarettes, uncomplicated: Secondary | ICD-10-CM

## 2017-02-17 DIAGNOSIS — F419 Anxiety disorder, unspecified: Secondary | ICD-10-CM | POA: Diagnosis not present

## 2017-02-17 MED ORDER — STERILE WATER FOR INJECTION IJ SOLN
INTRAMUSCULAR | Status: AC
  Administered 2017-02-17: 1.2 mL
  Filled 2017-02-17: qty 10

## 2017-02-17 NOTE — BH Assessment (Signed)
[facility] Assessment Progress Note   TTS spoke to patient's mother Bryan CardinalDeborah [name] [phone] for collateral information.  Mother states that patient has experienced some serious changes in behavior recently.  She states that he used to be a really neat person and now he is hoarding worthless stuff in his apartment to the point that he will not even let his family into his home.  She states that he was last hospitalized four years ago, but states that he has not been taking his medication in several years.  She states that he has been more paranoid lately, talking to himself and states that he has severe mood swings and gets very angry.  She states that when he gets angry that he punches walls.  Mother states that patient is constantly stating that he does not fit in with others and he has been socially isolating and will not even come to family events when he is invited.  For some reason he will not ride in vehicles and walks long distances rather than riding with family members. She states that he is not eating and she estimates that he has lost at least 20 pounds.  Mother states that he does not use any drugs or alcohol.

## 2017-02-17 NOTE — ED Notes (Signed)
Patient is alert and verbal. States he is here because he had a reaction to some cheese. Patient states he has been treated for being anxious in past but is not taking any medications currently. Patient is cooperative. Patient encouraged to rest and wait on doctor when he comes in to see him. Patient with Q 15 minute checks in progress and patient remains safe on unit.

## 2017-02-17 NOTE — BH Assessment (Signed)
[facility] Assessment Progress Note   Per Dr Arfeen, inpatient recommended    

## 2017-02-17 NOTE — BH Assessment (Signed)
[facility] Assessment Progress Note    Patient walking the hall agitated stating that he wants to leave.  Patient states that he is in a "fucked up room and everyone is playing games with him."  Concerned that he has not been able to take care of his hygiene needs.  States that he does not even know why he is here.  He states: "I just had a bad reaction to some cheese and I needed to flush my system out. The police came and I told them I needed to take a walk and then they returned and said they had papers on me." Patient is a little disorganized and irritated.  Informed him that the psychiatrist would see him this morning.  That seemed to appease him for the moment.

## 2017-02-17 NOTE — Progress Notes (Signed)
This patient continues to meet inpatient criteria. CSW faxed information to the following facilities:    [facility]  [facility] [place]  [facility] [place]   Stacy GardnerErin Leshawn Houseworth, [place] Emergency Room Clinical Social Worker [phone]

## 2017-02-17 NOTE — Consult Note (Signed)
[facility] Consult   Reason for Consult:  Psychosis Referring Physician:  EDP Patient Identification: Bryan [name] MRN:  [id number] Principal Diagnosis: Delusional disorder [facility]) Diagnosis:   Patient Active Problem List   Diagnosis Date Noted  . 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 Illiterate [Z55.0] 10/30/2011  . Schizoaffective disorder, depressive type ([place]) [F25.1] 10/27/2011  . Schizoaffective disorder ([place]) [F25.9] 08/10/2011  . Observed seizure-like activity ([place]) [R56.9] 08/09/2011  . PAIN IN JOINT, MULTIPLE SITES [M25.50] 02/16/2010  . FATIGUE [R53.81, R53.83] 02/16/2010  . Shortness of breath [R06.02] 02/16/2010    Total Time spent with patient: 45 minutes  Subjective:   Bryan [name] is a 41 y.o. male patient admitted with delusional thinking and paranoia.  HPI:  Pt was seen and chart reviewed with treatment team and Dr Adele Schilder. Pt stated he got an infection because he ate the wrong cheese. Pt is fixated in his delusion that cheese is causing all of his problems. Pt denies any mental health illness and stated he just gets "anxiuos." Pt is guarded, paranoid, disorganized, irritable, and minimizing the severity of his psychosis. Pt's UDS and BAL are negative. Pt has not been taking his antipsychotics according to the IVC paperwork. Pt is restarted on Abilify 5 mg QHS for mood stabilization. Pt would benefit from an inpatient psychiatric admission for crisis stabilization and medication management.   Past Psychiatric History: As above  Risk to Self: Suicidal Ideation: No Suicidal Intent: No Is patient at risk for suicide?: Yes Suicidal Plan?: No Access to Means: No What has been your use of drugs/alcohol within the last 12 months?: Hx of alcohol use but currently denies How many times?: (UTA) Other Self Harm Risks: (UTA) Triggers for Past Attempts: Unknown Intentional Self Injurious Behavior: (Per IVC punching walls) Risk to Others: Homicidal  Ideation: No Thoughts of Harm to Others: No Current Homicidal Intent: No Current Homicidal Plan: No Access to Homicidal Means: No Identified Victim: NA History of harm to others?: No Assessment of Violence: None Noted Violent Behavior Description: NA Does patient have access to weapons?: No Criminal Charges Pending?: No Does patient have a court date: No Prior Inpatient Therapy: Prior Inpatient Therapy: Yes Prior Therapy Dates: 2013 Prior Therapy Facilty/Provider(s): [facility] Reason for Treatment: MH issues Prior Outpatient Therapy: Prior Outpatient Therapy: (UTA) Prior Therapy Dates: (NA) Prior Therapy Facilty/Provider(s): (UTA) Reason for Treatment: (UTA) Does patient have an ACCT team?: Unknown Does patient have Intensive In-House Services?  : Unknown Does patient have [facility] services? : Unknown Does patient have P4CC services?: Unknown  Past Medical History:  Past Medical History:  Diagnosis Date  . Hypertension   . Psychiatric problem    Seen at [facility] for unknown reason. Takes seroquel. History of hearing voices.  Not commandivng voices.   . Schizophrenia ([place])   . Tremors of nervous system     Past Surgical History:  Procedure Laterality Date  . none     Family History: No family history on file. Family Psychiatric  History: Unknown Social History:  Social History   Substance and Sexual Activity  Alcohol Use Yes   Comment: says drinks a 40 at least once a week. denies drink in 2-3 weeks.      Social History   Substance and Sexual Activity  Drug Use Yes  . Types: Cocaine, Marijuana   Comment: marijuana somewhat regularly    Social History   Socioeconomic History  . Marital status: Married    Spouse name: Not on file  .  Number of children: Not on file  . Years of education: Not on file  . Highest education level: Not on file  Social Needs  . Financial resource strain: Not on file  . Food insecurity - worry: Not on file  . Food insecurity - inability:  Not on file  . Transportation needs - medical: Not on file  . Transportation needs - non-medical: Not on file  Occupational History  . Not on file  Tobacco Use  . Smoking status: Current Every Day Smoker    Packs/day: 0.50    Types: Cigarettes  Substance and Sexual Activity  . Alcohol use: Yes    Comment: says drinks a 40 at least once a week. denies drink in 2-3 weeks.   . Drug use: Yes    Types: Cocaine, Marijuana    Comment: marijuana somewhat regularly  . Sexual activity: No  Other Topics Concern  . Not on file  Social History Narrative   LIves in [place] in an apartment with his brother. Unclear why on medicaid.   Additional Social History:    Allergies:   Allergies  Allergen Reactions  . Coffee Bean Extract [Coffea Arabica] Swelling    Labs:  Results for orders placed or performed during the hospital encounter of 02/16/17 (from the past 48 hour(s))  Comprehensive metabolic panel     Status: Abnormal   Collection Time: 02/16/17  5:17 PM  Result Value Ref Range   Sodium 135 135 - 145 mmol/L   Potassium 3.4 (L) 3.5 - 5.1 mmol/L   Chloride 99 (L) 101 - 111 mmol/L   CO2 26 22 - 32 mmol/L   Glucose, Bld 122 (H) 65 - 99 mg/dL   BUN 13 6 - 20 mg/dL   Creatinine, Ser 0.84 0.61 - 1.24 mg/dL   Calcium 9.2 8.9 - 10.3 mg/dL   Total Protein 7.5 6.5 - 8.1 g/dL   Albumin 4.5 3.5 - 5.0 g/dL   AST 29 15 - 41 U/L   ALT 17 17 - 63 U/L   Alkaline Phosphatase 81 38 - 126 U/L   Total Bilirubin 1.4 (H) 0.3 - 1.2 mg/dL   GFR calc non Af Amer >60 >60 mL/min   GFR calc Af Amer >60 >60 mL/min    Comment: (NOTE) The eGFR has been calculated using the CKD EPI equation. This calculation has not been validated in all clinical situations. eGFR's persistently <60 mL/min signify possible Chronic Kidney Disease.    Anion gap 10 5 - 15  Ethanol     Status: None   Collection Time: 02/16/17  5:17 PM  Result Value Ref Range   Alcohol, Ethyl (B) <10 <10 mg/dL    Comment:        LOWEST  DETECTABLE LIMIT FOR SERUM ALCOHOL IS 10 mg/dL FOR MEDICAL PURPOSES ONLY   Lipase, blood     Status: None   Collection Time: 02/16/17  5:17 PM  Result Value Ref Range   Lipase 36 11 - 51 U/L  CBC with Differential     Status: Abnormal   Collection Time: 02/16/17  5:17 PM  Result Value Ref Range   WBC 5.3 4.0 - 10.5 K/uL   RBC 4.31 4.22 - 5.81 MIL/uL   Hemoglobin 13.4 13.0 - 17.0 g/dL   HCT 39.1 39.0 - 52.0 %   MCV 90.7 78.0 - 100.0 fL   MCH 31.1 26.0 - 34.0 pg   MCHC 34.3 30.0 - 36.0 g/dL   RDW 16.3 (H) 11.5 -  15.5 %   Platelets 295 150 - 400 K/uL   Neutrophils Relative % 54 %   Neutro Abs 2.8 1.7 - 7.7 K/uL   Lymphocytes Relative 34 %   Lymphs Abs 1.8 0.7 - 4.0 K/uL   Monocytes Relative 12 %   Monocytes Absolute 0.6 0.1 - 1.0 K/uL   Eosinophils Relative 0 %   Eosinophils Absolute 0.0 0.0 - 0.7 K/uL   Basophils Relative 0 %   Basophils Absolute 0.0 0.0 - 0.1 K/uL  Urinalysis, Routine w reflex microscopic     Status: Abnormal   Collection Time: 02/16/17  5:17 PM  Result Value Ref Range   Color, Urine STRAW (A) YELLOW   APPearance CLEAR CLEAR   Specific Gravity, Urine 1.003 (L) 1.005 - 1.030   pH 5.0 5.0 - 8.0   Glucose, UA NEGATIVE NEGATIVE mg/dL   Hgb urine dipstick SMALL (A) NEGATIVE   Bilirubin Urine NEGATIVE NEGATIVE   Ketones, ur NEGATIVE NEGATIVE mg/dL   Protein, ur NEGATIVE NEGATIVE mg/dL   Nitrite NEGATIVE NEGATIVE   Leukocytes, UA NEGATIVE NEGATIVE   RBC / HPF 0-5 0 - 5 RBC/hpf   WBC, UA 0-5 0 - 5 WBC/hpf   Bacteria, UA NONE SEEN NONE SEEN   Squamous Epithelial / LPF NONE SEEN NONE SEEN  Urine rapid drug screen (hosp performed)     Status: None   Collection Time: 02/16/17  5:17 PM  Result Value Ref Range   Opiates NONE DETECTED NONE DETECTED   Cocaine NONE DETECTED NONE DETECTED   Benzodiazepines NONE DETECTED NONE DETECTED   Amphetamines NONE DETECTED NONE DETECTED   Tetrahydrocannabinol NONE DETECTED NONE DETECTED   Barbiturates NONE DETECTED NONE  DETECTED    Comment: (NOTE) DRUG SCREEN FOR MEDICAL PURPOSES ONLY.  IF CONFIRMATION IS NEEDED FOR ANY PURPOSE, NOTIFY LAB WITHIN 5 DAYS. LOWEST DETECTABLE LIMITS FOR URINE DRUG SCREEN Drug Class                     Cutoff (ng/mL) Amphetamine and metabolites    1000 Barbiturate and metabolites    200 Benzodiazepine                 982 Tricyclics and metabolites     300 Opiates and metabolites        300 Cocaine and metabolites        300 THC                            50     Current Facility-Administered Medications  Medication Dose Route Frequency Provider Last Rate Last Dose  . acetaminophen (TYLENOL) tablet 650 mg  650 mg Oral Q6H PRN Carmin Muskrat, MD   650 mg at 02/16/17 2225  . ziprasidone (GEODON) injection 20 mg  20 mg Intramuscular Q12H PRN Carmin Muskrat, MD       And  . LORazepam (ATIVAN) tablet 1 mg  1 mg Oral PRN Carmin Muskrat, MD       No current outpatient medications on file.    Musculoskeletal: Strength & Muscle Tone: within normal limits Gait & Station: normal Patient leans: N/A  Psychiatric Specialty Exam: Physical Exam  Constitutional: He appears well-developed and well-nourished.  HENT:  Head: Normocephalic.  Respiratory: Effort normal.  Musculoskeletal: Normal range of motion.  Neurological: He is alert.  Psychiatric: His mood appears anxious. His speech is rapid and/or pressured. He is agitated. Thought content is paranoid and delusional.  Cognition and memory are impaired. He expresses impulsivity.    Review of Systems  Psychiatric/Behavioral: Positive for depression and hallucinations. Negative for memory loss, substance abuse and suicidal ideas. The patient is nervous/anxious. The patient does not have insomnia.   All other systems reviewed and are negative.   Blood pressure 139/89, pulse 79, temperature 98.2 F (36.8 C), temperature source Oral, resp. rate 18, SpO2 100 %.There is no height or weight on file to calculate BMI.  General  Appearance: Casual  Eye Contact:  Fair  Speech:  Blocked  Volume:  Normal  Mood:  Depressed and Irritable  Affect:  guarded, paranoid, minimizing, irritable  Thought Process:  Disorganized and Descriptions of Associations: Loose  Orientation:  Full (Time, Place, and Person)  Thought Content:  Illogical, Delusions, Hallucinations: Tactile believes he has an infection because he ate the wrong cheese.  and Rumination  Suicidal Thoughts:  No  Homicidal Thoughts:  No  Memory:  Immediate;   Fair Recent;   Fair Remote;   Fair  Judgement:  Impaired  Insight:  Lacking  Psychomotor Activity:  Decreased  Concentration:  Concentration: Fair and Attention Span: Fair  Recall:  [employer] of Knowledge:  Good  Language:  Good  Akathisia:  No  Handed:  Right  AIMS (if indicated):     Assets:  Agricultural consultant Housing  ADL's:  Intact  Cognition:  WNL  Sleep:        Treatment Plan Summary: Daily contact with patient to assess and evaluate symptoms and progress in treatment and Medication management  Disposition: Recommend psychiatric Inpatient admission when medically cleared. TTS to seek placement.  Ethelene Hal, NP 02/17/2017 12:19 PM

## 2017-02-17 NOTE — ED Notes (Signed)
Pt seen by staff eating paper toweling. Staff removed all paper towel available to pt.

## 2017-02-17 NOTE — ED Notes (Signed)
Patient refused to have vital signs taken. RN notified.

## 2017-02-17 NOTE — ED Notes (Signed)
SBAR Report received from previous nurse. Pt received calm and visible on unit. Pt denies current SI/ HI, A/V H, depression, anxiety, or pain at this time, and appears otherwise stable and free of distress. Pt reminded of camera surveillance, q 15 min rounds, and rules of the milieu. Will continue to assess. 

## 2017-02-18 NOTE — ED Notes (Signed)
Patient denies pain and is resting comfortably.  

## 2017-02-18 NOTE — ED Notes (Signed)
Report given to Durene RomansLydia, [facility] [phone]

## 2017-02-18 NOTE — ED Notes (Signed)
[facility] called for report

## 2017-02-18 NOTE — BH Assessment (Signed)
[facility] Assessment Progress Note  Per Kathryne SharperSyed Arfeen, MD, this pt requires psychiatric hospitalization at this time.  Pt presents under IVC initiated by pt's mother, which EDP Dr Jeraldine LootsLockwood has upheld.  At 08:59 Morrie Sheldonshley calls from [facility] to report that pt has been accepted to their facility by Dr Wendall StadeKohl [facility].  Dr Lolly MustacheArfeen concurs with this decision.  Pt's nurse, Angelique BlonderDenise, has been notified, and agrees to call report to [phone].  [facility]  Doylene Canninghomas Chasey Dull, [place] Behavioral Health Coordinator [phone]

## 2017-02-18 NOTE — ED Notes (Signed)
[facility]. Pt has all belongings.

## 2017-02-18 NOTE — ED Notes (Signed)
Pt was informed he will be going to [facility]. He denies SI/HI/AVH. He states he is tired and just want to rest. Denies pain. Staff will continue to monitor, meet needs, and maintain safety.

## 2017-02-18 NOTE — ED Notes (Signed)
Lying in bed quiet, no noted distress. Denies SI/HI/AVH

## 2017-04-14 ENCOUNTER — Other Ambulatory Visit: Payer: Self-pay

## 2017-04-14 ENCOUNTER — Encounter (HOSPITAL_COMMUNITY): Payer: Self-pay | Admitting: *Deleted

## 2017-04-14 ENCOUNTER — Emergency Department (HOSPITAL_COMMUNITY)
Admission: EM | Admit: 2017-04-14 | Discharge: 2017-04-14 | Disposition: A | Discharge status: Home / Self Care | Payer: Medicaid Other | Attending: Emergency Medicine | Admitting: Emergency Medicine

## 2017-04-14 DIAGNOSIS — G2402 Drug induced acute dystonia: Secondary | ICD-10-CM | POA: Diagnosis not present

## 2017-04-14 DIAGNOSIS — I10 Essential (primary) hypertension: Secondary | ICD-10-CM | POA: Insufficient documentation

## 2017-04-14 DIAGNOSIS — F17210 Nicotine dependence, cigarettes, uncomplicated: Secondary | ICD-10-CM | POA: Insufficient documentation

## 2017-04-14 DIAGNOSIS — R6884 Jaw pain: Secondary | ICD-10-CM | POA: Diagnosis present

## 2017-04-14 MED ORDER — DIPHENHYDRAMINE HCL 50 MG/ML IJ SOLN
50.0000 mg | Freq: Once | INTRAMUSCULAR | Status: AC
  Administered 2017-04-14: 50 mg via INTRAMUSCULAR
  Filled 2017-04-14: qty 1

## 2017-04-14 MED ORDER — DIPHENHYDRAMINE HCL 25 MG PO TABS: 25.0000 mg | ORAL_TABLET | Freq: Four times a day (QID) | ORAL | 0 refills | Status: DC

## 2017-04-14 NOTE — ED Triage Notes (Signed)
Pt reports taking fluphenazine this am. Reports swelling to his tongue and lockjaw. Feels like he can't breath. Pt and family very anxious at triage. spo2 99%

## 2017-04-14 NOTE — ED Provider Notes (Signed)
[facility] Provider Note   CSN: [id number] Arrival date & time: 04/14/17  [id number]     History   Chief Complaint Chief Complaint  Patient presents with  . Allergic Reaction    HPI   Blood pressure (!) 159/121, pulse 99, resp. rate 18, SpO2 100 %.  Bryan [name] is a 42 y.o. male with history of schizophrenia complaining of feeling like his jaw is locked up onset this morning.  He states he had a similar reaction to a medication called [facility]henazine [facility], as per mother this [facility].  Patient denies rash, itching.  Dates that he has posterior throat discomfort and he feels short of breath.  Past Medical History:  Diagnosis Date  . Hypertension   . Psychiatric problem    [facility]. Takes seroquel. History of hearing voices.  Not commandivng voices.   . Schizophrenia ([facility])   . Tremors of nervous system     Patient Active Problem List   Diagnosis Date Noted  . 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 Illiterate 10/30/2011  . Schizoaffective disorder, depressive type ([facility]) 10/27/2011  . Schizoaffective disorder ([facility]) 08/10/2011  . Observed seizure-like activity ([facility]) 08/09/2011  . PAIN IN JOINT, MULTIPLE SITES 02/16/2010  . FATIGUE 02/16/2010  . Shortness of breath 02/16/2010    Past Surgical History:  Procedure Laterality Date  . none         Home Medications    Prior to Admission medications   Medication Sig Start Date End Date Taking? Authorizing Provider  diphenhydrAMINE (BENADRYL) 25 mg capsule Take 1 capsule (25 mg total) by mouth every 8 (eight) hours. 01/16/11 01/26/11  Jacqulynn CadetWare, Lark[place], MD  diphenhydrAMINE (BENADRYL) 25 MG tablet Take 1 tablet (25 mg total) by mouth every 6 (six) hours. 04/14/17   Murial Beam, Mardella LaymanNicole, PA-C    Family History History reviewed. No pertinent family history.  Social History Social History   Tobacco Use   . Smoking status: Current Every Day Smoker    Packs/day: 0.50    Types: Cigarettes  Substance Use Topics  . Alcohol use: Yes    Comment: says drinks a 40 at least once a week. denies drink in 2-3 weeks.   . Drug use: Yes    Types: Cocaine, Marijuana    Comment: marijuana somewhat regularly     Allergies   Coffee bean extract [coffea arabica]   Review of Systems Review of Systems  A complete review of systems was obtained and all systems are negative except as noted in the HPI and PMH.   Physical Exam Updated Vital Signs BP (!) 127/100   Pulse 76   Temp 98.9 F (37.2 C) (Oral)   Resp 19   SpO2 99%   Physical Exam  Constitutional: He is oriented to person, place, and time. He appears well-developed and well-nourished. No distress.  HENT:  Head: Normocephalic and atraumatic.  Mouth/Throat: Oropharynx is clear and moist.  No stridor or drooling. No posterior pharynx edema, lip or tongue swelling. Pt reclining comfortably, speaking in complete sentences.   No Wheezing, excellent air movement in all fields.   Eyes: Conjunctivae and EOM are normal. Pupils are equal, round, and reactive to light.  Neck: Normal range of motion.  Cardiovascular: Normal rate, regular rhythm and intact distal pulses.  Pulmonary/Chest: Effort normal and breath sounds normal. No stridor. No respiratory distress. He has no wheezes. He has  no rales. He exhibits no tenderness.  Abdominal: Soft. There is no tenderness.  Musculoskeletal: Normal range of motion.  Neurological: He is alert and oriented to person, place, and time.  Skin: He is not diaphoretic.  No Rash  Psychiatric: He has a normal mood and affect.  Nursing note and vitals reviewed.    ED Treatments / Results  Labs (all labs ordered are listed, but only abnormal results are displayed) Labs Reviewed - No data to display  EKG  EKG Interpretation None       Radiology No results found.  Procedures Procedures (including  critical care time)  Medications Ordered in ED Medications  diphenhydrAMINE (BENADRYL) injection 50 mg (50 mg Intramuscular Given 04/14/17 0907)     Initial Impression / Assessment and Plan / ED Course  I have reviewed the triage vital signs and the nursing notes.  Pertinent labs & imaging results that were available during my care of the patient were reviewed by me and considered in my medical decision making (see chart for details).     Vitals:   04/14/17 0839 04/14/17 0914 04/14/17 0930  BP: (!) 159/121  (!) 127/100  Pulse: 99  76  Resp: 18  19  Temp:  98.9 F (37.2 C)   TempSrc:  Oral   SpO2: 100%  99%    Medications  diphenhydrAMINE (BENADRYL) injection 50 mg (50 mg Intramuscular Given 04/14/17 1610)    Bryan [name] is 42 y.o. male presenting with possible allergic reaction, this patient is taking an antipsychotic medication is had a similar reaction in the past, he does have some difficulty opening the jaw consistent with a dystonic reaction there is also some twitching likely related to anxiety.  No angioedema, hives, I doubt this is anaphylaxis.  Patient given IM Benadryl with resolution of all of his symptoms.  Advised his mother to DC the antipsychotic medication, will write prescription for Benadryl for a few days.  They are to follow at [facility] in the next 24-48 hours.  Evaluation does not show pathology that would require ongoing emergent intervention or inpatient treatment. Pt is hemodynamically stable and mentating appropriately. Discussed findings and plan with patient/guardian, who agrees with care plan. All questions answered. Return precautions discussed and outpatient follow up given.      Final Clinical Impressions(s) / ED Diagnoses   Final diagnoses:  Acute dystonic reaction due to drugs    ED Discharge Orders        Ordered    diphenhydrAMINE (BENADRYL) 25 MG tablet  Every 6 hours     04/14/17 0949       Cathalina Barcia, Mardella Layman 04/14/17  0957    Wynetta Fines, MD 04/14/17 2038

## 2017-04-14 NOTE — ED Notes (Addendum)
Wheeled pt to room. Asked pt to remove his shirt for gown, pt stated "I cannot move my arms." Pt then used both arms and legs to stand up and get into bed. Pt assisted with removal of clothes. Pt talking without difficulty in complete sentences, not respiratory distress noted. Pt had tremor to leg. When mother and PA entered room pt then had tremor to whole body, arms and legs. Pt states "I had coffee yesterday. I took my psych meds last night at 1 am."

## 2017-04-14 NOTE — Discharge Instructions (Signed)
Please follow with your primary care doctor in the next 2 days for a check-up. They must obtain records for further management.  ° °Do not hesitate to return to the Emergency Department for any new, worsening or concerning symptoms.  ° °

## 2017-07-05 ENCOUNTER — Encounter (HOSPITAL_COMMUNITY): Payer: Self-pay | Admitting: Emergency Medicine

## 2017-07-05 ENCOUNTER — Emergency Department (HOSPITAL_COMMUNITY)
Admission: EM | Admit: 2017-07-05 | Discharge: 2017-07-06 | Disposition: A | Discharge status: Other Acute Inpatient Hospital | Payer: Medicaid Other | Attending: Emergency Medicine | Admitting: Emergency Medicine

## 2017-07-05 DIAGNOSIS — F1210 Cannabis abuse, uncomplicated: Secondary | ICD-10-CM | POA: Insufficient documentation

## 2017-07-05 DIAGNOSIS — F251 Schizoaffective disorder, depressive type: Secondary | ICD-10-CM | POA: Diagnosis present

## 2017-07-05 DIAGNOSIS — F17210 Nicotine dependence, cigarettes, uncomplicated: Secondary | ICD-10-CM | POA: Insufficient documentation

## 2017-07-05 DIAGNOSIS — F22 Delusional disorders: Secondary | ICD-10-CM | POA: Insufficient documentation

## 2017-07-05 LAB — COMPREHENSIVE METABOLIC PANEL
ALK PHOS: 65 U/L (ref 38–126)
ALT: 14 U/L — ABNORMAL LOW (ref 17–63)
ANION GAP: 13 (ref 5–15)
AST: 27 U/L (ref 15–41)
Albumin: 4.8 g/dL (ref 3.5–5.0)
BUN: 16 mg/dL (ref 6–20)
CALCIUM: 9.6 mg/dL (ref 8.9–10.3)
CO2: 25 mmol/L (ref 22–32)
Chloride: 102 mmol/L (ref 101–111)
Creatinine, Ser: 1.06 mg/dL (ref 0.61–1.24)
GFR calc Af Amer: >60 mL/min (ref >60)
GFR calc non Af Amer: >60 mL/min (ref >60)
GLUCOSE: 101 mg/dL — AB (ref 65–99)
POTASSIUM: 3.9 mmol/L (ref 3.5–5.1)
Sodium: 140 mmol/L (ref 135–145)
Total Bilirubin: 1.4 mg/dL — ABNORMAL HIGH (ref 0.3–1.2)
Total Protein: 7.7 g/dL (ref 6.5–8.1)

## 2017-07-05 LAB — CBC
HEMATOCRIT: 37.4 % — AB (ref 39.0–52.0)
HEMOGLOBIN: 12.3 g/dL — AB (ref 13.0–17.0)
MCH: 30.7 pg (ref 26.0–34.0)
MCHC: 32.9 g/dL (ref 30.0–36.0)
MCV: 93.3 fL (ref 78.0–100.0)
Platelets: 278 10*3/uL (ref 150–400)
RBC: 4.01 MIL/uL — ABNORMAL LOW (ref 4.22–5.81)
RDW: 15.7 % — ABNORMAL HIGH (ref 11.5–15.5)
WBC: 5 10*3/uL (ref 4.0–10.5)

## 2017-07-05 LAB — ETHANOL: Alcohol, Ethyl (B): <10 mg/dL (ref <10)

## 2017-07-05 LAB — SALICYLATE LEVEL: Salicylate Lvl: <7 mg/dL (ref 2.8–30.0)

## 2017-07-05 LAB — ACETAMINOPHEN LEVEL

## 2017-07-05 NOTE — ED Notes (Signed)
Bed: WBH41 Expected date:  Expected time:  Means of arrival:  Comments: 

## 2017-07-05 NOTE — BH Assessment (Signed)
[facility] Assessment Progress Note Case was staffed with Lord DNP who recommended a inpatient admission to assist with stabilization as appropriate bed placement is investigated.      

## 2017-07-05 NOTE — ED Provider Notes (Signed)
[facility]-EMERGENCY DEPT Provider Note   CSN: [id number] Arrival date & time: 07/05/17  1453     History   Chief Complaint Chief Complaint  Patient presents with  . IVC    HPI Bryan [name] is a 42 y.o. male.  42 year old male with history of schizophrenia presents under IVC by his mother due to being noncompliant with his medications.  He denies any suicidal or homicidal ideations.  States he is not responding to internal stimuli.  Has not been taking care of his daily personal hygiene.  According to the IVC paperwork, patient has been punching holes in walls at home and destroying property.  He denies this.  When attempting to get further information from the patient, he has a questions in a circular fashion.  Therefore the history is limited at this time.     Past Medical History:  Diagnosis Date  . Hypertension   . Psychiatric problem    Seen at [facility] for unknown reason. Takes seroquel. History of hearing voices.  Not commandivng voices.   . Schizophrenia (HCC)   . Tremors of nervous system     Patient Active Problem List   Diagnosis Date Noted  . 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 Illiterate 10/30/2011  . Schizoaffective disorder, depressive type (HCC) 10/27/2011  . Schizoaffective disorder (HCC) 08/10/2011  . Observed seizure-like activity (HCC) 08/09/2011  . PAIN IN JOINT, MULTIPLE SITES 02/16/2010  . FATIGUE 02/16/2010  . Shortness of breath 02/16/2010    Past Surgical History:  Procedure Laterality Date  . none          Home Medications    Prior to Admission medications   Medication Sig Start Date End Date Taking? Authorizing Provider  aspirin 325 MG tablet Take 325 mg by mouth once.    [provider]  benztropine (COGENTIN) 1 MG tablet Take 1 mg by mouth 2 (two) times daily.    [provider]  diphenhydrAMINE (BENADRYL) 25 mg capsule Take 1 capsule (25 mg total) by mouth every 8 (eight) hours. 01/16/11  01/26/11  Jacqulynn Cadet, MD  diphenhydrAMINE (BENADRYL) 25 MG tablet Take 1 tablet (25 mg total) by mouth every 6 (six) hours. 04/14/17   Pisciotta, Mardella Layman    Family History No family history on file.  Social History Social History   Tobacco Use  . Smoking status: Current Every Day Smoker    Packs/day: 0.50    Types: Cigarettes  . Smokeless tobacco: Never Used  Substance Use Topics  . Alcohol use: Yes    Comment: says drinks a 40 at least once a week. denies drink in 2-3 weeks.   . Drug use: Yes    Types: Cocaine, Marijuana    Comment: marijuana somewhat regularly     Allergies   Fluphenazine and Coffee bean extract [coffea arabica]   Review of Systems Review of Systems  Unable to perform ROS: Psychiatric disorder     Physical Exam Updated Vital Signs BP 120/87 (BP Location: Left Arm)   Pulse 87   Temp 98.5 F (36.9 C) (Oral)   Resp 18   SpO2 98%   Physical Exam  Constitutional: He is oriented to person, place, and time. He appears well-developed and well-nourished.  Non-toxic appearance. No distress.  HENT:  Head: Normocephalic and atraumatic.  Eyes: Pupils are equal, round, and reactive to light. Conjunctivae, EOM and lids are normal.  Neck: Normal range of motion. Neck supple. No tracheal deviation present. No thyroid mass present.  Cardiovascular: Normal rate, regular rhythm and normal heart sounds. Exam reveals no gallop.  No murmur heard. Pulmonary/Chest: Effort normal and breath sounds normal. No stridor. No respiratory distress. He has no decreased breath sounds. He has no wheezes. He has no rhonchi. He has no rales.  Abdominal: Soft. Normal appearance and bowel sounds are normal. He exhibits no distension. There is no tenderness. There is no rebound and no CVA tenderness.  Musculoskeletal: Normal range of motion. He exhibits no edema or tenderness.  Neurological: He is alert and oriented to person, place, and time. He has normal strength. No cranial  nerve deficit or sensory deficit. GCS eye subscore is 4. GCS verbal subscore is 5. GCS motor subscore is 6.  Skin: Skin is warm and dry. No abrasion and no rash noted.  Psychiatric: His affect is blunt. His speech is delayed. He is slowed. He is not actively hallucinating. He expresses no suicidal plans and no homicidal plans.  Nursing note and vitals reviewed.    ED Treatments / Results  Labs (all labs ordered are listed, but only abnormal results are displayed) Labs Reviewed  COMPREHENSIVE METABOLIC PANEL  ETHANOL  SALICYLATE LEVEL  ACETAMINOPHEN LEVEL  CBC  RAPID URINE DRUG SCREEN, HOSP PERFORMED    EKG None  Radiology No results found.  Procedures Procedures (including critical care time)  Medications Ordered in ED Medications - No data to display   Initial Impression / Assessment and Plan / ED Course  I have reviewed the triage vital signs and the nursing notes.  Pertinent labs & imaging results that were available during my care of the patient were reviewed by me and considered in my medical decision making (see chart for details).     Labs are pending at this time but patient will likely be medically cleared for psychiatric disposition.  Final Clinical Impressions(s) / ED Diagnoses   Final diagnoses:  None    ED Discharge Orders    None       Lorre Nick, MD 07/05/17 1556

## 2017-07-05 NOTE — ED Notes (Signed)
[name] left copies of medications pt is taking.

## 2017-07-05 NOTE — ED Notes (Signed)
ED Provider at bedside. 

## 2017-07-05 NOTE — ED Notes (Signed)
Bed: [facility] Expected date:  Expected time:  Means of arrival:  Comments: 

## 2017-07-05 NOTE — ED Triage Notes (Signed)
Pt brought in by [employer] under IVC paper that state:  "[name] has been previously diagnosed with schizophrenia. He also has been previously committed but he is non-compliant with his medication regimen. Family states that he is refusing to take his meds, refusing to bathe or eat regularly, he has had his current outfit on for 7 days in a row. Family states that he is also punching holes in walls of her house and destroying peoples property. Family also states that respondent was up all night walking around the house. Family is concerned for his safety until he can be evaluated".

## 2017-07-05 NOTE — ED Notes (Signed)
Pt A&O x 3, no distress noted, calm & cooperative at present.  Watching TV at present.  Monitoring for safety, Q 15 min checks in effect. 

## 2017-07-05 NOTE — BH Assessment (Addendum)
Assessment Note  Bryan [name] is an 42 y.o. male that presents this date with IVC. Per IVC: "[name] has been previously diagnosed with Schizophrenia. [name] is currently non-compliant with his medication regimen. Family states that he is refusing to take his medications, refusing to eat or bathe. Family reports that he has been punching holes in the wall at their residence and destroying other peoples property. Family reports that respondent has not been sleeping at night and just "walks around the house." Family is concerned for safety until he can be evaluated." Patient is observed to be very disorganized this date and speaks in a slow soft voice. Patient displays active thought blocking and affect is observed to be flat. Patient is not sure why he is here and is oriented to place only stating "this is a hospital I guess." Patient cannot render a complete history due to patient being very disorganized. Patient denies any S/I, H/I or AVH although seems to be responding to internal stimuli as evidenced by patient putting his fingers in his hears and responding to questions this writer did not ask. Information to complete assessment was utilized from admission notes and previous history. Patient per notes has a history of cannabis use although UDS is pending. Patient does not appear to be impaired. Per note review patient presented in 2013 with similar symptoms and was at that time receiving services from [facility]. Per notes this date patient has a history of Schizophrenia with psychotic features. Patient this date, was very disorganized in his speech and thought processes, but was pleasant, calm and cooperative. Patient reported that he knows he is in the hospital for help, he requested help because he has been wandering around. Per admission note, patient has a history of schizophrenia presenting under IVC by his mother due to being noncompliant with his medications. He denies any suicidal or homicidal  ideations. Patient has not been taking care of his daily personal hygiene. According to the IVC paperwork, patient has been punching holes in walls at home and destroying property. Patient denies content of IVC. When attempting to get further information from the patient, he has a questions in a circular fashion. Therefore the history is limited at this time. Case was staffed with Shaune Pollack DNP who recommended a inpatient admission to assist with stabilization as appropriate bed placement is investigated.    Diagnosis: F20.9 Schizophrenia (per notes)   Past Medical History:  Past Medical History:  Diagnosis Date  . Hypertension   . Psychiatric problem    Seen at [facility] for unknown reason. Takes seroquel. History of hearing voices.  Not commandivng voices.   . Schizophrenia (HCC)   . Tremors of nervous system     Past Surgical History:  Procedure Laterality Date  . none      Family History: No family history on file.  Social History:  reports that he has been smoking cigarettes.  He has been smoking about 0.50 packs per day. He has never used smokeless tobacco. He reports that he drinks alcohol. He reports that he has current or past drug history. Drugs: Cocaine and Marijuana.  Additional Social History:  Alcohol / Drug Use Pain Medications: See MAR Prescriptions: See MAR Over the Counter: See MAR History of alcohol / drug use?: Yes Longest period of sobriety (when/how long): Unknown Negative Consequences of Use: (Denies) Withdrawal Symptoms: (Denies) Substance #1 Name of Substance 1: Cannabis use per hx 1 - Age of First Use: Unknown 1 - Amount (size/oz): Pt denies current use  1 - Frequency: Pt denies current use  1 - Duration: Pt denies current use 1 - Last Use / Amount: Pt denies current use  CIWA: CIWA-Ar BP: 119/83 Pulse Rate: 77 COWS:    Allergies:  Allergies  Allergen Reactions  . Fluphenazine Shortness Of Breath and Swelling    Tongue swelling, and shaking  .  Coffee Bean Extract [Coffea Arabica] Swelling    Home Medications:  (Not in a hospital admission)  OB/GYN Status:  No LMP for male patient.  General Assessment Data Location of Assessment: [facility] TTS Assessment: In system Is this a Tele or Face-to-Face Assessment?: Face-to-Face Is this an Initial Assessment or a Re-assessment for this encounter?: Initial Assessment Marital status: Single Maiden name: NA Is patient pregnant?: No Pregnancy Status: No Living Arrangements: Alone Can pt return to current living arrangement?: Yes Admission Status: Involuntary Is patient capable of signing voluntary admission?: No Referral Source: Self/Family/Friend Insurance type: Medicaid  Medical Screening Exam [facility] Walk-in ONLY) Medical Exam completed: Yes  Crisis Care Plan Living Arrangements: Alone Legal Guardian: (NA) Name of Psychiatrist: [facility](Per notes) Name of Therapist: None  Education Status Is patient currently in school?: No Is the patient employed, unemployed or receiving disability?: Unemployed  Risk to self with the past 6 months Suicidal Ideation: No Has patient been a risk to self within the past 6 months prior to admission? : No Suicidal Intent: No Has patient had any suicidal intent within the past 6 months prior to admission? : No Is patient at risk for suicide?: No, but patient needs Medical Clearance Suicidal Plan?: No Has patient had any suicidal plan within the past 6 months prior to admission? : No Access to Means: No What has been your use of drugs/alcohol within the last 12 months?: Denies current use hx of cannabis use Previous Attempts/Gestures: No How many times?: 0(Per note review) Other Self Harm Risks: (Off MH medications) Triggers for Past Attempts: Other (Comment)(Medication non-compliance) Intentional Self Injurious Behavior: None Family Suicide History: No Recent stressful life event(s): Other (Comment)(Medication non-compliance) Persecutory  voices/beliefs?: No Depression: No Depression Symptoms: (Pt cannot identify symptoms) Substance abuse history and/or treatment for substance abuse?: No Suicide prevention information given to non-admitted patients: Not applicable  Risk to Others within the past 6 months Homicidal Ideation: No Does patient have any lifetime risk of violence toward others beyond the six months prior to admission? : No Thoughts of Harm to Others: No Current Homicidal Intent: No Current Homicidal Plan: No Access to Homicidal Means: No Identified Victim: (NA) History of harm to others?: (Hx of threats to family per hx) Assessment of Violence: (Per IVC destroys property) Violent Behavior Description: Per IVC destroying property Does patient have access to weapons?: No Criminal Charges Pending?: No Does patient have a court date: No Is patient on probation?: No  Psychosis Hallucinations: Auditory Delusions: None noted  Mental Status Report Appearance/Hygiene: Body odor Eye Contact: Poor Motor Activity: Freedom of movement Speech: Soft, Slow Level of Consciousness: Quiet/awake Mood: Preoccupied Affect: Flat Anxiety Level: Minimal Thought Processes: Thought Blocking Judgement: Unimpaired Orientation: Unable to assess Obsessive Compulsive Thoughts/Behaviors: None  Cognitive Functioning Concentration: Unable to Assess Memory: Unable to Assess Is patient IDD: No Is patient DD?: No Insight: Unable to Assess Impulse Control: Unable to Assess Appetite: (UTA) Have you had any weight changes? : (UTA) Sleep: (UTA) Total Hours of Sleep: (UTA) Vegetative Symptoms: (UTA)  ADLScreening [facility] Assessment Services) Patient's cognitive ability adequate to safely complete daily activities?: Yes Patient able to express  need for assistance with ADLs?: Yes Independently performs ADLs?: Yes (appropriate for developmental age)  Prior Inpatient Therapy Prior Inpatient Therapy: Yes Prior Therapy Dates:  2013 Prior Therapy Facilty/Provider(s): [facility] Reason for Treatment: MH issues  Prior Outpatient Therapy Prior Outpatient Therapy: Yes Prior Therapy Dates: Ongoing Prior Therapy Facilty/Provider(s): [facility](Per notes) Reason for Treatment: Med mang Does patient have an ACCT team?: No Does patient have Intensive In-House Services?  : No Does patient have [facility] services? : Yes Does patient have P4CC services?: No  ADL Screening (condition at time of admission) Patient's cognitive ability adequate to safely complete daily activities?: Yes Is the patient deaf or have difficulty hearing?: No Does the patient have difficulty seeing, even when wearing glasses/contacts?: No Does the patient have difficulty concentrating, remembering, or making decisions?: Yes Patient able to express need for assistance with ADLs?: Yes Does the patient have difficulty dressing or bathing?: No Independently performs ADLs?: Yes (appropriate for developmental age) Does the patient have difficulty walking or climbing stairs?: No Weakness of Legs: None Weakness of Arms/Hands: None  Home Assistive Devices/Equipment Home Assistive Devices/Equipment: None  Therapy Consults (therapy consults require a physician order) PT Evaluation Needed: No OT Evalulation Needed: No SLP Evaluation Needed: No Abuse/Neglect Assessment (Assessment to be complete while patient is alone) Physical Abuse: Denies Verbal Abuse: Denies Sexual Abuse: Denies Exploitation of patient/patient's resources: Denies Self-Neglect: Denies Values / Beliefs Cultural Requests During Hospitalization: None Spiritual Requests During Hospitalization: None Consults Spiritual Care Consult Needed: No Social Work Consult Needed: No [employer]chant [employer] officer (For Healthcare) Does Patient Have a Medical Advance Directive?: No Would patient like information on creating a medical advance directive?: No - Patient declined    Additional Information 1:1 In  Past 12 Months?: No CIRT Risk: No Elopement Risk: No Does patient have medical clearance?: Yes     Disposition: Case was staffed with Shaune Pollack DNP who recommended a inpatient admission to assist with stabilization as appropriate bed placement is investigated. Disposition Initial Assessment Completed for this Encounter: Yes Disposition of Patient: Admit Type of inpatient treatment program: Adult Patient refused recommended treatment: No Mode of transportation if patient is discharged?: (Unknown)  On Site Evaluation by:   Reviewed with Physician:    Alfredia Ferguson 07/05/2017 5:30 PM

## 2017-07-06 ENCOUNTER — Inpatient Hospital Stay (HOSPITAL_COMMUNITY)
Admission: AD | Admit: 2017-07-06 | Discharge: 2017-07-17 | DRG: 885 | Disposition: A | Discharge status: Home / Self Care | Payer: Medicaid Other | Attending: Psychiatry | Admitting: Psychiatry

## 2017-07-06 DIAGNOSIS — F1290 Cannabis use, unspecified, uncomplicated: Secondary | ICD-10-CM

## 2017-07-06 DIAGNOSIS — F1490 Cocaine use, unspecified, uncomplicated: Secondary | ICD-10-CM

## 2017-07-06 DIAGNOSIS — Z888 Allergy status to other drugs, medicaments and biological substances status: Secondary | ICD-10-CM

## 2017-07-06 DIAGNOSIS — Z91018 Allergy to other foods: Secondary | ICD-10-CM

## 2017-07-06 DIAGNOSIS — G4700 Insomnia, unspecified: Secondary | ICD-10-CM | POA: Diagnosis present

## 2017-07-06 DIAGNOSIS — F251 Schizoaffective disorder, depressive type: Secondary | ICD-10-CM | POA: Diagnosis not present

## 2017-07-06 DIAGNOSIS — F419 Anxiety disorder, unspecified: Secondary | ICD-10-CM | POA: Diagnosis present

## 2017-07-06 DIAGNOSIS — R4587 Impulsiveness: Secondary | ICD-10-CM

## 2017-07-06 DIAGNOSIS — Z9114 Patient's other noncompliance with medication regimen: Secondary | ICD-10-CM

## 2017-07-06 DIAGNOSIS — M7918 Myalgia, other site: Secondary | ICD-10-CM | POA: Diagnosis not present

## 2017-07-06 DIAGNOSIS — I10 Essential (primary) hypertension: Secondary | ICD-10-CM | POA: Diagnosis not present

## 2017-07-06 DIAGNOSIS — F203 Undifferentiated schizophrenia: Secondary | ICD-10-CM | POA: Diagnosis present

## 2017-07-06 DIAGNOSIS — F22 Delusional disorders: Secondary | ICD-10-CM | POA: Diagnosis not present

## 2017-07-06 DIAGNOSIS — F17210 Nicotine dependence, cigarettes, uncomplicated: Secondary | ICD-10-CM

## 2017-07-06 DIAGNOSIS — R451 Restlessness and agitation: Secondary | ICD-10-CM | POA: Diagnosis not present

## 2017-07-06 DIAGNOSIS — G253 Myoclonus: Secondary | ICD-10-CM | POA: Diagnosis present

## 2017-07-06 DIAGNOSIS — F1099 Alcohol use, unspecified with unspecified alcohol-induced disorder: Secondary | ICD-10-CM | POA: Diagnosis not present

## 2017-07-06 DIAGNOSIS — R456 Violent behavior: Secondary | ICD-10-CM

## 2017-07-06 DIAGNOSIS — Z79899 Other long term (current) drug therapy: Secondary | ICD-10-CM | POA: Diagnosis not present

## 2017-07-06 DIAGNOSIS — F319 Bipolar disorder, unspecified: Secondary | ICD-10-CM | POA: Diagnosis present

## 2017-07-06 DIAGNOSIS — Z818 Family history of other mental and behavioral disorders: Secondary | ICD-10-CM | POA: Diagnosis not present

## 2017-07-06 DIAGNOSIS — Z550 Illiteracy and low-level literacy: Secondary | ICD-10-CM | POA: Diagnosis not present

## 2017-07-06 DIAGNOSIS — R450 Nervousness: Secondary | ICD-10-CM | POA: Diagnosis not present

## 2017-07-06 DIAGNOSIS — F29 Unspecified psychosis not due to a substance or known physiological condition: Secondary | ICD-10-CM | POA: Diagnosis not present

## 2017-07-06 LAB — RAPID URINE DRUG SCREEN, HOSP PERFORMED
Amphetamines: NOT DETECTED
BARBITURATES: NOT DETECTED
BENZODIAZEPINES: NOT DETECTED
COCAINE: NOT DETECTED
Opiates: NOT DETECTED
TETRAHYDROCANNABINOL: NOT DETECTED

## 2017-07-06 MED ORDER — GABAPENTIN 300 MG PO CAPS
300.0000 mg | ORAL_CAPSULE | Freq: Two times a day (BID) | ORAL | Status: DC
  Administered 2017-07-06 (×2): 300 mg via ORAL
  Filled 2017-07-06 (×2): qty 1

## 2017-07-06 MED ORDER — PALIPERIDONE ER 6 MG PO TB24
6.0000 mg | ORAL_TABLET | Freq: Every day | ORAL | Status: DC
  Administered 2017-07-06: 6 mg via ORAL
  Filled 2017-07-06: qty 1

## 2017-07-06 MED ORDER — BENZTROPINE MESYLATE 0.5 MG PO TABS
0.5000 mg | ORAL_TABLET | Freq: Every day | ORAL | Status: DC
  Administered 2017-07-06: 0.5 mg via ORAL
  Filled 2017-07-06: qty 1

## 2017-07-06 NOTE — Consult Note (Signed)
[facility] Consult   Reason for Consult:  Non-compliant with medications, disorganized behavior, talking to himself Referring Physician:  EDP Patient Identification: Bryan [name] MRN:  [id number] Principal Diagnosis: Schizoaffective disorder, depressive type [facility]) Diagnosis:   Patient Active Problem List   Diagnosis Date Noted  . 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 Illiterate [Z55.0] 10/30/2011  . Schizoaffective disorder, depressive type ([place]) [F25.1] 10/27/2011  . Schizoaffective disorder ([place]) [F25.9] 08/10/2011  . Observed seizure-like activity ([place]) [R56.9] 08/09/2011  . PAIN IN JOINT, MULTIPLE SITES [M25.50] 02/16/2010  . FATIGUE [R53.81, R53.83] 02/16/2010  . Shortness of breath [R06.02] 02/16/2010    Total Time spent with patient: 45 minutes  Subjective:   Bryan [name] is a 42 y.o. male patient admitted due to non-compliant with medications and disorganized behavior.  HPI: Patient with history of Bipolar disorder, Schizophrenia who was  IVC by his mother due to being noncompliant with his medications, increasingly worse aggressive behaviors, talking to himself as if responding to internal stimuli, not taking care of his hygiene, not eating or sleeping for days. Mother reports that patient has been punching holes in the wall and destroying properties at home. Patient is a poor historian who is unable to give details of the circumstances that brought him to the hospital.  Past Psychiatric History: as above  Risk to Self: Suicidal Ideation: No Suicidal Intent: No Is patient at risk for suicide?: No, but patient needs Medical Clearance Suicidal Plan?: No Access to Means: No What has been your use of drugs/alcohol within the last 12 months?: Denies current use hx of cannabis use How many times?: 0(Per note review) Other Self Harm Risks: (Off MH medications) Triggers for Past Attempts: Other (Comment)(Medication non-compliance) Intentional Self Injurious  Behavior: None Risk to Others: Homicidal Ideation: No Thoughts of Harm to Others: No Current Homicidal Intent: No Current Homicidal Plan: No Access to Homicidal Means: No Identified Victim: (NA) History of harm to others?: (Hx of threats to family per hx) Assessment of Violence: (Per IVC destroys property) Violent Behavior Description: Per IVC destroying property Does patient have access to weapons?: No Criminal Charges Pending?: No Does patient have a court date: No Prior Inpatient Therapy: Prior Inpatient Therapy: Yes Prior Therapy Dates: 2013 Prior Therapy Facilty/Provider(s): [facility] Reason for Treatment: MH issues Prior Outpatient Therapy: Prior Outpatient Therapy: Yes Prior Therapy Dates: Ongoing Prior Therapy Facilty/Provider(s): [facility](Per notes) Reason for Treatment: Med mang Does patient have an ACCT team?: No Does patient have Intensive In-House Services?  : No Does patient have [facility] services? : Yes Does patient have P4CC services?: No  Past Medical History:  Past Medical History:  Diagnosis Date  . Hypertension   . Psychiatric problem    Seen at [facility] for unknown reason. Takes seroquel. History of hearing voices.  Not commandivng voices.   . Schizophrenia ([place])   . Tremors of nervous system     Past Surgical History:  Procedure Laterality Date  . none     Family History: No family history on file. Family Psychiatric  History: Social History:  Social History   Substance and Sexual Activity  Alcohol Use Yes   Comment: says drinks a 40 at least once a week. denies drink in 2-3 weeks.      Social History   Substance and Sexual Activity  Drug Use Yes  . Types: Cocaine, Marijuana   Comment: marijuana somewhat regularly    Social History   Socioeconomic History  . Marital status: Married    Spouse  name: Not on file  . Number of children: Not on file  . Years of education: Not on file  . Highest education level: Not on file  Occupational History   . Not on file  Social Needs  . Financial resource strain: Not on file  . Food insecurity:    Worry: Not on file    Inability: Not on file  . Transportation needs:    Medical: Not on file    Non-medical: Not on file  Tobacco Use  . Smoking status: Current Every Day Smoker    Packs/day: 0.50    Types: Cigarettes  . Smokeless tobacco: Never Used  Substance and Sexual Activity  . Alcohol use: Yes    Comment: says drinks a 40 at least once a week. denies drink in 2-3 weeks.   . Drug use: Yes    Types: Cocaine, Marijuana    Comment: marijuana somewhat regularly  . Sexual activity: Never  Lifestyle  . Physical activity:    Days per week: Not on file    Minutes per session: Not on file  . Stress: Not on file  Relationships  . Social connections:    Talks on phone: Not on file    Gets together: Not on file    Attends religious service: Not on file    Active member of club or organization: Not on file    Attends meetings of clubs or organizations: Not on file    Relationship status: Not on file  Other Topics Concern  . Not on file  Social History Narrative   LIves in [place] in an apartment with his brother. Unclear why on medicaid.   Additional Social History:    Allergies:   Allergies  Allergen Reactions  . Fluphenazine Shortness Of Breath and Swelling    Tongue swelling, and shaking  . Coffee Bean Extract [Coffea Arabica] Swelling    Labs:  Results for orders placed or performed during the hospital encounter of 07/05/17 (from the past 48 hour(s))  Comprehensive metabolic panel     Status: Abnormal   Collection Time: 07/05/17  3:35 PM  Result Value Ref Range   Sodium 140 135 - 145 mmol/L   Potassium 3.9 3.5 - 5.1 mmol/L   Chloride 102 101 - 111 mmol/L   CO2 25 22 - 32 mmol/L   Glucose, Bld 101 (H) 65 - 99 mg/dL   BUN 16 6 - 20 mg/dL   Creatinine, Ser 1.06 0.61 - 1.24 mg/dL   Calcium 9.6 8.9 - 10.3 mg/dL   Total Protein 7.7 6.5 - 8.1 g/dL   Albumin 4.8 3.5  - 5.0 g/dL   AST 27 15 - 41 U/L   ALT 14 (L) 17 - 63 U/L   Alkaline Phosphatase 65 38 - 126 U/L   Total Bilirubin 1.4 (H) 0.3 - 1.2 mg/dL   GFR calc non Af Amer >60 >60 mL/min   GFR calc Af Amer >60 >60 mL/min    Comment: (NOTE) The eGFR has been calculated using the CKD EPI equation. This calculation has not been validated in all clinical situations. eGFR's persistently <60 mL/min signify possible Chronic Kidney Disease.    Anion gap 13 5 - 15    Comment: Performed at [facility], [place] [address], [place], [place] [address]  Ethanol     Status: None   Collection Time: 07/05/17  3:35 PM  Result Value Ref Range   Alcohol, Ethyl (B) <10 <10 mg/dL  Comment:        LOWEST DETECTABLE LIMIT FOR SERUM ALCOHOL IS 10 mg/dL FOR MEDICAL PURPOSES ONLY Performed at [place] [address], [place], [place] [address]   Salicylate level     Status: None   Collection Time: 07/05/17  3:35 PM  Result Value Ref Range   Salicylate Lvl <0.2 2.8 - 30.0 mg/dL    Comment: Performed at [facility], [place] [address] [place], [place] [address]  Acetaminophen level     Status: Abnormal   Collection Time: 07/05/17  3:35 PM  Result Value Ref Range   Acetaminophen (Tylenol), Serum <10 (L) 10 - 30 ug/mL    Comment:        THERAPEUTIC CONCENTRATIONS VARY SIGNIFICANTLY. A RANGE OF 10-30 ug/mL MAY BE AN EFFECTIVE CONCENTRATION FOR MANY PATIENTS. HOWEVER, SOME ARE BEST TREATED AT CONCENTRATIONS OUTSIDE THIS RANGE. ACETAMINOPHEN CONCENTRATIONS >150 ug/mL AT 4 HOURS AFTER INGESTION AND >50 ug/mL AT 12 HOURS AFTER INGESTION ARE OFTEN ASSOCIATED WITH TOXIC REACTIONS. Performed at [facility], [place] [address], [place], [place] [address]   cbc     Status: Abnormal   Collection Time: 07/05/17  3:35 PM  Result Value Ref Range   WBC 5.0 4.0 - 10.5 K/uL   RBC 4.01 (L) 4.22 - 5.81 MIL/uL   Hemoglobin 12.3 (L) 13.0 - 17.0 g/dL    HCT 37.4 (L) 39.0 - 52.0 %   MCV 93.3 78.0 - 100.0 fL   MCH 30.7 26.0 - 34.0 pg   MCHC 32.9 30.0 - 36.0 g/dL   RDW 15.7 (H) 11.5 - 15.5 %   Platelets 278 150 - 400 K/uL    Comment: Performed at [facility], [place] [address] [place],  [address]  Rapid urine drug screen (hospital performed)     Status: None   Collection Time: 07/06/17  7:36 AM  Result Value Ref Range   Opiates NONE DETECTED NONE DETECTED   Cocaine NONE DETECTED NONE DETECTED   Benzodiazepines NONE DETECTED NONE DETECTED   Amphetamines NONE DETECTED NONE DETECTED   Tetrahydrocannabinol NONE DETECTED NONE DETECTED   Barbiturates NONE DETECTED NONE DETECTED    Comment: (NOTE) DRUG SCREEN FOR MEDICAL PURPOSES ONLY.  IF CONFIRMATION IS NEEDED FOR ANY PURPOSE, NOTIFY LAB WITHIN 5 DAYS. LOWEST DETECTABLE LIMITS FOR URINE DRUG SCREEN Drug Class                     Cutoff (ng/mL) Amphetamine and metabolites    1000 Barbiturate and metabolites    200 Benzodiazepine                 956 Tricyclics and metabolites     300 Opiates and metabolites        300 Cocaine and metabolites        300 THC                            50 Performed at [facility], [place] [address] [place],  [address]     Current Facility-Administered Medications  Medication Dose Route Frequency Provider Last Rate Last Dose  . gabapentin (NEURONTIN) capsule 300 mg  300 mg Oral BID Juni Glaab, MD      . paliperidone (INVEGA) 24 hr tablet 6 mg  6 mg Oral Daily Jerrianne Hartin, MD       Current Outpatient Medications  Medication Sig Dispense Refill  .  benztropine (COGENTIN) 1 MG tablet Take 1 mg by mouth 2 (two) times daily.    . fluPHENAZine (PROLIXIN) 10 MG tablet Take 10-20 mg by mouth daily. Take 26m by mouth daily for 7 seven days, then take 227mby mouth thereafter daily.    . Marland KitchenLANZapine (ZYPREXA) 5 MG tablet Take 5 mg by mouth at bedtime.    . diphenhydrAMINE (BENADRYL) 25 MG tablet  Take 1 tablet (25 mg total) by mouth every 6 (six) hours. (Patient not taking: Reported on 07/05/2017) 20 tablet 0    Musculoskeletal: Strength & Muscle Tone: within normal limits Gait & Station: normal Patient leans: N/A  Psychiatric Specialty Exam: Physical Exam  Psychiatric: Thought content normal. His affect is blunt. His speech is delayed. He is agitated, aggressive and actively hallucinating. Cognition and memory are normal. He expresses impulsivity.    Review of Systems  Constitutional: Negative.   HENT: Negative.   Eyes: Negative.   Respiratory: Negative.   Cardiovascular: Negative.   Gastrointestinal: Negative.   Genitourinary: Negative.   Musculoskeletal: Negative.   Skin: Negative.   Neurological: Negative.   Endo/Heme/Allergies: Negative.   Psychiatric/Behavioral: Positive for hallucinations.    Blood pressure (!) 126/102, pulse 97, temperature 97.6 F (36.4 C), temperature source Oral, resp. rate 20, SpO2 99 %.There is no height or weight on file to calculate BMI.  General Appearance: Disheveled  Eye Contact:  Minimal  Speech:  Slow  Volume:  Decreased  Mood:  Irritable  Affect:  Labile  Thought Process:  Disorganized  Orientation:  Full (Time, Place, and Person)  Thought Content:  Illogical, Hallucinations: Auditory and Rumination  Suicidal Thoughts:  No  Homicidal Thoughts:  No  Memory:  Immediate;   Fair Recent;   Fair Remote;   Fair  Judgement:  Poor  Insight:  Shallow  Psychomotor Activity:  Psychomotor Retardation  Concentration:  Concentration: Fair and Attention Span: Fair  Recall:  [employer] Knowledge:  Fair  Language:  Good  Akathisia:  No  Handed:  Right  AIMS (if indicated):     Assets:  Communication Skills Social Support  ADL's:  Intact  Cognition:  WNL  Sleep:   poor     Treatment Plan Summary: Daily contact with patient to assess and evaluate symptoms and progress in treatment and Medication management Start Gabapentin 300  mg bid for mood, Invega 6 mg daily for schizophrenia and Cogentin 0.5 mg daily for EPS prevention. Disposition: Recommend psychiatric Inpatient admission when medically cleared.  AkCorena PilgrimMD 07/06/2017 11:03 AM

## 2017-07-06 NOTE — ED Notes (Signed)
[facility] transport requested. 

## 2017-07-06 NOTE — Progress Notes (Signed)
Per Thedore Mins, MD, this pt requires psychiatric hospitalization at this time. CSW faxed information to the following facilities:   [facility]  [facility]  [facility]  Stacy Gardner, [place] Clinical Social Worker [phone]

## 2017-07-06 NOTE — ED Notes (Signed)
Report called to RN [place], [facility] rm [phone].  Pending [facility] transport.

## 2017-07-06 NOTE — ED Notes (Signed)
Patient's right side jerking intermittently.  Nurse called to room by tech to observe patient's unusual movements.  Nurse practitioner notified who is at bedside also to assess patient.  Patient talking during episode, "it's probably the medication coming out of my system."

## 2017-07-06 NOTE — ED Notes (Signed)
Urine specimen requested, pt states he is aware that cup is in his room, has not attempted to given urine at this time.

## 2017-07-06 NOTE — ED Notes (Signed)
Pt awake, alert & responsive, no distress noted, calm & cooperative, resting at present.  Monitoring for safety, Q 15 min checks in effect.  Pending [facility] report and transfer.

## 2017-07-06 NOTE — BH Assessment (Signed)
[facility] Assessment Progress Note  Per AC, patient has been accepted to [facility] 504-1 at 2000 hours. IVC has been faxed.

## 2017-07-07 ENCOUNTER — Other Ambulatory Visit: Payer: Self-pay

## 2017-07-07 ENCOUNTER — Encounter (HOSPITAL_COMMUNITY): Payer: Self-pay | Admitting: *Deleted

## 2017-07-07 DIAGNOSIS — F17210 Nicotine dependence, cigarettes, uncomplicated: Secondary | ICD-10-CM

## 2017-07-07 DIAGNOSIS — R450 Nervousness: Secondary | ICD-10-CM

## 2017-07-07 DIAGNOSIS — F1290 Cannabis use, unspecified, uncomplicated: Secondary | ICD-10-CM

## 2017-07-07 DIAGNOSIS — F203 Undifferentiated schizophrenia: Secondary | ICD-10-CM

## 2017-07-07 DIAGNOSIS — Z9114 Patient's other noncompliance with medication regimen: Secondary | ICD-10-CM

## 2017-07-07 DIAGNOSIS — G4700 Insomnia, unspecified: Secondary | ICD-10-CM

## 2017-07-07 DIAGNOSIS — F1490 Cocaine use, unspecified, uncomplicated: Secondary | ICD-10-CM

## 2017-07-07 DIAGNOSIS — F419 Anxiety disorder, unspecified: Secondary | ICD-10-CM

## 2017-07-07 DIAGNOSIS — Z818 Family history of other mental and behavioral disorders: Secondary | ICD-10-CM

## 2017-07-07 DIAGNOSIS — F1099 Alcohol use, unspecified with unspecified alcohol-induced disorder: Secondary | ICD-10-CM

## 2017-07-07 MED ORDER — DIPHENHYDRAMINE HCL 50 MG PO CAPS
50.0000 mg | ORAL_CAPSULE | Freq: Once | ORAL | Status: AC
  Administered 2017-07-07: 50 mg via ORAL
  Filled 2017-07-07: qty 1
  Filled 2017-07-07: qty 2

## 2017-07-07 MED ORDER — LORAZEPAM 1 MG PO TABS
2.0000 mg | ORAL_TABLET | Freq: Once | ORAL | Status: AC
  Administered 2017-07-07: 2 mg via ORAL
  Filled 2017-07-07: qty 2

## 2017-07-07 MED ORDER — TRAZODONE HCL 50 MG PO TABS
50.0000 mg | ORAL_TABLET | Freq: Every evening | ORAL | Status: DC | PRN
  Administered 2017-07-07 – 2017-07-14 (×4): 50 mg via ORAL
  Filled 2017-07-07 (×6): qty 1

## 2017-07-07 MED ORDER — LORAZEPAM 1 MG PO TABS
1.0000 mg | ORAL_TABLET | Freq: Four times a day (QID) | ORAL | Status: DC | PRN
  Administered 2017-07-07 – 2017-07-08 (×2): 1 mg via ORAL
  Filled 2017-07-07 (×3): qty 1

## 2017-07-07 MED ORDER — ALUM & MAG HYDROXIDE-SIMETH 200-200-20 MG/5ML PO SUSP: 30.0000 mL | ORAL | Status: DC | PRN

## 2017-07-07 MED ORDER — GABAPENTIN 100 MG PO CAPS
100.0000 mg | ORAL_CAPSULE | Freq: Two times a day (BID) | ORAL | Status: DC
  Administered 2017-07-07 – 2017-07-14 (×14): 100 mg via ORAL
  Filled 2017-07-07 (×18): qty 1

## 2017-07-07 MED ORDER — PALIPERIDONE ER 6 MG PO TB24
6.0000 mg | ORAL_TABLET | Freq: Every day | ORAL | Status: DC
Start: 2017-07-07 — End: 2017-07-17
  Administered 2017-07-07 – 2017-07-17 (×11): 6 mg via ORAL
  Filled 2017-07-07 (×13): qty 1

## 2017-07-07 MED ORDER — MAGNESIUM HYDROXIDE 400 MG/5ML PO SUSP: 30.0000 mL | Freq: Every day | ORAL | Status: DC | PRN

## 2017-07-07 MED ORDER — ACETAMINOPHEN 325 MG PO TABS: 650.0000 mg | ORAL_TABLET | Freq: Four times a day (QID) | ORAL | Status: DC | PRN

## 2017-07-07 MED ORDER — ZIPRASIDONE HCL 20 MG PO CAPS
20.0000 mg | ORAL_CAPSULE | Freq: Once | ORAL | Status: AC
  Administered 2017-07-07: 20 mg via ORAL
  Filled 2017-07-07 (×2): qty 1

## 2017-07-07 MED ORDER — GABAPENTIN 300 MG PO CAPS
300.0000 mg | ORAL_CAPSULE | Freq: Two times a day (BID) | ORAL | Status: DC
  Administered 2017-07-07: 300 mg via ORAL
  Filled 2017-07-07 (×6): qty 1

## 2017-07-07 MED ORDER — HYDROXYZINE HCL 25 MG PO TABS: 25.0000 mg | ORAL_TABLET | Freq: Three times a day (TID) | ORAL | Status: DC | PRN

## 2017-07-07 MED ORDER — ZIPRASIDONE HCL 20 MG PO CAPS: 20.0000 mg | ORAL_CAPSULE | Freq: Two times a day (BID) | ORAL | Status: DC

## 2017-07-07 NOTE — [facility] Group Notes (Signed)
[facility] Group Notes: (Clinical Social Work)   07/07/2017      Type of Therapy:  Group Therapy   Participation Level:  Did Not Attend despite MHT prompting   Jaryn Hocutt Grossman-Orr, LCSW 07/07/2017, 12:58 PM     

## 2017-07-07 NOTE — Progress Notes (Signed)
Admission note:  Pt is a 42 year old AAM admitted to the services of Dr. Altamese Gilchrist for psychosis and medication non-compliance under IVC by [name].  Mother stated that Pt was not compliant with his medications, not sleeping, and wandering the house at night.  She also reported that Pt was punching holes in the wall.  During assessment Pt appeared to be responding to internal stimuli.  He would stop in the middle of signing paperwork and stare straight ahead then appear to jump and start signing again.  Pt had what appeared muscle tics more and more frequently as the admission process continued.  Pt was unable to undress for skin assessment and required assistance.  Pt was assisted onto unit via wheelchair by MHT and then in to bed.  Pt was able to speak with his nurse on the unit and nurse reported no tics at that time.  Pt was often unintelligible during admission questions.  Pt is presently in bed, no distress noted.  Pt is a high fall risk.

## 2017-07-07 NOTE — H&P (Addendum)
Psychiatric Admission Assessment Adult  Patient Identification: Domenique Southers  MRN:  [id number]  Date of Evaluation:  07/07/2017  Chief Complaint:  Schizophrenia  Principal Diagnosis: Undifferentiated schizophrenia ([place])  Diagnosis:   Patient Active Problem List   Diagnosis Date Noted  . Undifferentiated schizophrenia ([place]) [F20.3]     Priority: High  . 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 Illiterate [Z55.0] 10/30/2011  . Schizoaffective disorder, depressive type ([place]) [F25.1] 10/27/2011  . Schizoaffective disorder ([place]) [F25.9] 08/10/2011  . Observed seizure-like activity ([place]) [R56.9] 08/09/2011  . PAIN IN JOINT, MULTIPLE SITES [M25.50] 02/16/2010  . FATIGUE [R53.81, R53.83] 02/16/2010  . Shortness of breath [R06.02] 02/16/2010   History of Present Illness: (Per Md's admission SRA): Patient is 42 year old single male, no children.  As per chart, history of Schizophrenia. Admitted under IVC reporting that patient has been refusing to take medications, is not taking care of basic ADLs ( not bathing or eating regularly), and has been punching holes in wall and destroying property. Has been up all night walking around d the house. Chart notes indicate that he has presented with flat affect , thought disorder, thought blocking. At this time patient presents as fair/limited  historian, minimizes symptoms- states " they brought me here" , " I have been anxious" but does not elaborate on why. Denies hallucinations, denies depression or sadness , denies suicidal ideations. Chart notes indicate prior diagnosis of Schizophrenia. He has history of prior admission to our unit in 2013 for psychosis, at which time he was diagnosed with Schizoaffective Disorder. As per home medication list he has been prescribed Prolixin , acknowledges he has not been taking medications. Denies alcohol or drug abuse - admission UDS negative, admission   Associated Signs/Symptoms:  Depression Symptoms:   insomnia, anxiety, decreased appetite,  (Hypo) Manic Symptoms:  Hallucinations, Labiality of Mood,  Anxiety Symptoms:  Excessive Worry,  Psychotic Symptoms:  Delusions,  PTSD Symptoms: NA  Total Time spent with patient: 1 hour  Past Psychiatric History: Bipolar disorder, Schizophrenia  Is the patient at risk to self? No.  Has the patient been a risk to self in the past 6 months? No.  Has the patient been a risk to self within the distant past? No.  Is the patient a risk to others? No.  Has the patient been a risk to others in the past 6 months? No.  Has the patient been a risk to others within the distant past? No.   Prior Inpatient Therapy: Yes. Prior Outpatient Therapy: [facility]  Alcohol Screening: Patient refused Alcohol Screening Tool: Yes  Substance Abuse History in the last 12 months:  Yes.    Consequences of Substance Abuse: NA  Previous Psychotropic Medications: Yes   Psychological Evaluations: No   Past Medical History:  Past Medical History:  Diagnosis Date  . Hypertension   . Psychiatric problem    Seen at [facility] for unknown reason. Takes seroquel. History of hearing voices.  Not commandivng voices.   . Schizophrenia ([place])   . Tremors of nervous system     Past Surgical History:  Procedure Laterality Date  . none     Family History: History reviewed. No pertinent family history.  Family Psychiatric  History: Mental illness runs in the family.  Tobacco Screening: Have you used any form of tobacco in the last 30 days? (Cigarettes, Smokeless Tobacco, Cigars, and/or Pipes): Yes Tobacco use, Select all that apply: 5 or more cigarettes per day Are you interested in Tobacco Cessation Medications?:  Yes, will notify MD for an order Counseled patient on smoking cessation including recognizing danger situations, developing coping skills and basic information about quitting provided: Refused/Declined practical counseling  Social History:  Social History    Substance and Sexual Activity  Alcohol Use Yes   Comment: says drinks a 40 at least once a week. denies drink in 2-3 weeks.      Social History   Substance and Sexual Activity  Drug Use Yes  . Types: Cocaine, Marijuana   Comment: marijuana somewhat regularly    Additional Social History:  Allergies:   Allergies  Allergen Reactions  . Fluphenazine Shortness Of Breath and Swelling    Tongue swelling, and shaking  . Coffee Bean Extract [Coffea Arabica] Swelling   Lab Results:  Results for orders placed or performed during the hospital encounter of 07/05/17 (from the past 48 hour(s))  Rapid urine drug screen (hospital performed)     Status: None   Collection Time: 07/06/17  7:36 AM  Result Value Ref Range   Opiates NONE DETECTED NONE DETECTED   Cocaine NONE DETECTED NONE DETECTED   Benzodiazepines NONE DETECTED NONE DETECTED   Amphetamines NONE DETECTED NONE DETECTED   Tetrahydrocannabinol NONE DETECTED NONE DETECTED   Barbiturates NONE DETECTED NONE DETECTED    Comment: (NOTE) DRUG SCREEN FOR MEDICAL PURPOSES ONLY.  IF CONFIRMATION IS NEEDED FOR ANY PURPOSE, NOTIFY LAB WITHIN 5 DAYS. LOWEST DETECTABLE LIMITS FOR URINE DRUG SCREEN Drug Class                     Cutoff (ng/mL) Amphetamine and metabolites    1000 Barbiturate and metabolites    200 Benzodiazepine                 200 Tricyclics and metabolites     300 Opiates and metabolites        300 Cocaine and metabolites        300 THC                            50 Performed at [facility], [address] [place], [place] [address]    Blood Alcohol level:  Lab Results  Component Value Date   ETH <10 07/05/2017   ETH <10 02/16/2017   Metabolic Disorder Labs:  No results found for: HGBA1C, MPG No results found for: PROLACTIN No results found for: CHOL, TRIG, HDL, CHOLHDL, VLDL, LDLCALC  Current Medications: Current Facility-Administered Medications  Medication Dose Route Frequency  Provider Last Rate Last Dose  . acetaminophen (TYLENOL) tablet 650 mg  650 mg Oral Q6H PRN Parks, Laurie Britton, NP      . alum & mag hydroxide-simeth (MAALOX/MYLANTA) 200-200-20 MG/5ML suspension 30 mL  30 mL Oral Q4H PRN Parks, Laurie Britton, NP      . gabapentin (NEURONTIN) capsule 100 mg  100 mg Oral BID ,  A, MD      . LORazepam (ATIVAN) tablet 1 mg  1 mg Oral Q6H PRN ,  A, MD      . magnesium hydroxide (MILK OF MAGNESIA) suspension 30 mL  30 mL Oral Daily PRN Parks, Laurie Britton, NP      . paliperidone (INVEGA) 24 hr tablet 6 mg  6 mg Oral Daily Parks, Laurie Britton, NP   6 mg at 07/07/17 0839  . traZODone (DESYREL) tablet 50 mg  50 mg Oral QHS PRN Parks, Laurie Britton, NP         PTA Medications: Medications Prior to Admission  Medication Sig Dispense Refill Last Dose  . benztropine (COGENTIN) 1 MG tablet Take 1 mg by mouth 2 (two) times daily.   unknown  . diphenhydrAMINE (BENADRYL) 25 MG tablet Take 1 tablet (25 mg total) by mouth every 6 (six) hours. (Patient not taking: Reported on 07/05/2017) 20 tablet 0 Not Taking at Unknown time  . fluPHENAZine (PROLIXIN) 10 MG tablet Take 10-20 mg by mouth daily. Take 3m by mouth daily for 7 seven days, then take 252mby mouth thereafter daily.   unknown  . OLANZapine (ZYPREXA) 5 MG tablet Take 5 mg by mouth at bedtime.   unknown   Musculoskeletal: Strength & Muscle Tone: within normal limits Gait & Station: normal Patient leans: N/A  Psychiatric Specialty Exam: Physical Exam  Constitutional: He appears well-developed.  HENT:  Head: Normocephalic.  Eyes: Pupils are equal, round, and reactive to light.  Neck: Normal range of motion.  Cardiovascular: Normal rate.  Respiratory: Effort normal.  GI: Soft.  Genitourinary:  Genitourinary Comments: Deferred  Musculoskeletal: Normal range of motion.  Neurological: He is alert.  Skin: Skin is warm.    Review of Systems  Constitutional: Negative.   HENT:  Negative.   Eyes: Negative.   Respiratory: Negative.  Negative for cough and shortness of breath.   Cardiovascular: Negative.  Negative for chest pain, palpitations and leg swelling.  Gastrointestinal: Negative for nausea and vomiting.  Genitourinary: Negative.   Musculoskeletal: Negative.   Skin: Negative.   Neurological: Negative.   Endo/Heme/Allergies: Negative.   Psychiatric/Behavioral: Positive for hallucinations and suicidal ideas. Negative for depression, memory loss and substance abuse. The patient is nervous/anxious and has insomnia.     Blood pressure 109/74, pulse 76, temperature 97.9 F (36.6 C), temperature source Oral, resp. rate 18, height 5' 9.5" (1.765 m), weight 70.3 kg (155 lb).Body mass index is 22.56 kg/m.  General Appearance: Disheveled  Eye Contact:  Minimal  Speech:  Slow  Volume:  Decreased  Mood:  reports mood is "OK", denies depression, reports anxiety  Affect:  blunted  Thought Process:  Disorganized  Orientation:  Other:  he is sedated, but easily alertable on calling his name, is oriented  to [employer] and to 12/19, but did not identify month.   Thought Content:  denies hallucinations, no delusions expressed at this time  Suicidal Thoughts:  No denies suicidal or self injurious ideations, denies homicidal or violent ideations  Homicidal Thoughts:  No  Memory:  recent and remote fair   Judgement:  Impaired  Insight:  Lacking  Psychomotor Activity:  Decreased- no agitation or restlessness at this time  Concentration:  Concentration: Fair and Attention Span: Fair  Recall:  [employer] Knowledge:  Fair  Language:  Fair  Akathisia:  Negative  Handed:  Right  AIMS (if indicated):     Assets:  Financial Resources/Insurance Resilience  ADL's:  Impaired  Cognition:  Impaired,  Mild  Sleep:  Number of Hours: 4.5     Treatment Plan/Recommendations: 1. Admit for crisis management and stabilization, estimated length of stay 3-5  days.   2. Medication management to reduce current symptoms to base line and improve the patient's overall level of functioning: See MAR, Md's SRA & treatment plan.   Observation Level/Precautions:  15 minute checks  Laboratory:  Per ED  Psychotherapy: Group sessions  Medications: See Mar   Consultations: As needed   Discharge Concerns: Safety, mood stability   Estimated LOS:  5-7 days  Other: Admit to the [facility]    Physician Treatment Plan for Primary Diagnosis: Undifferentiated schizophrenia (HCC)  Long Term Goal(s): Improvement in symptoms so as ready for discharge  Short Term Goals: Ability to demonstrate self-control will improve  Physician Treatment Plan for Secondary Diagnosis: Principal Problem:   Undifferentiated schizophrenia (HCC) Active Problems:   Delusional disorder (HCC)  Long Term Goal(s): Improvement in symptoms so as ready for discharge  Short Term Goals: Ability to maintain clinical measurements within normal limits will improve and Compliance with prescribed medications will improve  I certify that inpatient services furnished can reasonably be expected to improve the patient's condition.    Agnes Nwoko, NP, PMHNP, FNP-BC 5/12/20194:11 PM   I have discussed case with NP and have met with patient  Agree with NP note and assessment  Patient is 41 year old single male, no children.  As per chart, history of Schizophrenia. Admitted under IVC reporting that patient has been refusing to take medications, is not taking care of basic ADLs ( not bathing or eating regularly), and has been punching holes in wall and destroying property. Has been up all night walking around d the house. Chart notes indicate that he has presented with flat affect , thought disorder, thought blocking. At this time patient presents as fair/limited  historian, minimizes symptoms- states " they brought me here" , " I have been anxious" but does not elaborate on why. Denies hallucinations,  denies depression or sadness , denies suicidal ideations. Chart notes indicate prior diagnosis of Schizophrenia. He has history of prior admission to our unit in 2013 for psychosis, at which time he was diagnosed with Schizoaffective Disorder. As per home medication list he has been prescribed Prolixin , acknowledges he has not been taking medications. Denies alcohol or drug abuse - admission UDS negative, admission BAL negative   Patient denies medical illnesses .  Dx- Schizophrenia  Plan- started on Invega 6 mgrs QDAY.  Ativan PRNs for anxiety or agitation Check Lipid Panel, HgbA1C, EKG to monitor QTc   

## 2017-07-07 NOTE — Plan of Care (Signed)
D: Pt denies SI/HI/AVH. Pt is anxious and suspicious on the unit. Pt not exhibited any jerking motion this evening. Pt was visible on the unit for very short time this evening.   A: Pt was offered support and encouragement. Pt was given scheduled medications. Pt was encourage to attend groups. Q 15 minute checks were done for safety.   R: safety maintained on unit.   Problem: Activity: Goal: Interest or engagement in activities will improve Outcome: Progressing   Problem: Activity: Goal: Sleeping patterns will improve Outcome: Progressing   Problem: Safety: Goal: Periods of time without injury will increase Outcome: Progressing

## 2017-07-07 NOTE — Tx Team (Signed)
Initial Treatment Plan 07/07/2017 1:21 AM [name] NWG:[id number]    PATIENT STRESSORS: Medication change or noncompliance Substance abuse   PATIENT STRENGTHS: Average or above average intelligence Supportive family/friends   PATIENT IDENTIFIED PROBLEMS: Psychosis   Non-compliance with medication    "get help"               DISCHARGE CRITERIA:  Improved stabilization in mood, thinking, and/or behavior  PRELIMINARY DISCHARGE PLAN: Attend aftercare/continuing care group Outpatient therapy  PATIENT/FAMILY INVOLVEMENT: This treatment plan has been presented to and reviewed with the patient, Torre Schaumburg.  The patient and family have been given the opportunity to ask questions and make suggestions.  Juliann Pares, RN 07/07/2017, 1:21 AM

## 2017-07-07 NOTE — Progress Notes (Signed)
When pt was observed in the search room pt was observed having jerking motions, they appeared to be getting worse with more staff seen. Pt was transported to his room, pt was given his medications per Maryjean Morn and for the 5 min spent talking with patient he was not observed presenting with jerking motions. Pt stated he was having an issue with his glands feeling swollen and problems swallowing, pt stated this has been going on "for a while" . Pt was informed to speak to the doctor to find out if that could be medication related.

## 2017-07-07 NOTE — [facility] Suicide Risk Assessment (Signed)
[facility] Admission Suicide Risk Assessment   Nursing information obtained from:  Patient Demographic factors:  Male, Unemployed, Living alone Current Mental Status:  Suicidal ideation indicated by others, Self-harm behaviors Loss Factors:  (psychosis) Historical Factors:  Family history of mental illness or substance abuse, Impulsivity Risk Reduction Factors:  Sense of responsibility to family  Total Time spent with patient: 45 minutes Principal Problem: Diagnosis:   Patient Active Problem List   Diagnosis Date Noted  . 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 Illiterate [Z55.0] 10/30/2011  . Schizoaffective disorder, depressive type (HCC) [F25.1] 10/27/2011  . Schizoaffective disorder (HCC) [F25.9] 08/10/2011  . Observed seizure-like activity (HCC) [R56.9] 08/09/2011  . PAIN IN JOINT, MULTIPLE SITES [M25.50] 02/16/2010  . FATIGUE [R53.81, R53.83] 02/16/2010  . Shortness of breath [R06.02] 02/16/2010     Continued Clinical Symptoms:    The "Alcohol Use Disorders Identification Test", Guidelines for Use in Primary Care, Second Edition.  World Science writer [facility]). Score between 0-7:  no or low risk or alcohol related problems. Score between 8-15:  moderate risk of alcohol related problems. Score between 16-19:  high risk of alcohol related problems. Score 20 or above:  warrants further diagnostic evaluation for alcohol dependence and treatment.   CLINICAL FACTORS:  Patient is 42 year old single male, no children.  As per chart, history of Schizophrenia. Admitted under IVC reporting that patient has been refusing to take medications, is not taking care of basic ADLs ( not bathing or eating regularly), and has been punching holes in wall and destroying property. Has been up all night walking around d the house. Chart notes indicate that he has presented with flat affect , thought disorder, thought blocking. At this time patient presents as fair/limited  historian, minimizes symptoms-  states " they brought me here" , " I have been anxious" but does not elaborate on why. Denies hallucinations, denies depression or sadness , denies suicidal ideations. Chart notes indicate prior diagnosis of Schizophrenia. He has history of prior admission to our unit in 2013 for psychosis, at which time he was diagnosed with Schizoaffective Disorder. As per home medication list he has been prescribed Prolixin , acknowledges he has not been taking medications. Denies alcohol or drug abuse - admission UDS negative, admission BAL negative   Patient denies medical illnesses .  Dx- Schizophrenia  Plan- started on Invega 6 mgrs QDAY.  Ativan PRNs for anxiety or agitation Check Lipid Panel, HgbA1C, EKG to monitor QTc     Musculoskeletal: Strength & Muscle Tone: within normal limits Gait & Station: normal Patient leans: N/A  Psychiatric Specialty Exam: Physical Exam  ROS denies chest pain, no shortness of breath, no vomiting , no fever or chills  Blood pressure 109/74, pulse 76, temperature 97.9 F (36.6 C), temperature source Oral, resp. rate 18, height 5' 9.5" (1.765 m), weight 70.3 kg (155 lb).Body mass index is 22.56 kg/m.  General Appearance: Disheveled  Eye Contact:  Minimal  Speech:  Slow  Volume:  Decreased  Mood:  reports mood is "OK", denies depression, reports anxiety  Affect:  blunted  Thought Process:  Disorganized  Orientation:  Other:  he is sedated, but easily alertable on calling his name, is oriented  to [facility] and to"[place]"  and to 12/19, but did not identify month.   Thought Content:  denies hallucinations, no delusions expressed at this time  Suicidal Thoughts:  No denies suicidal or self injurious ideations, denies homicidal or violent ideations  Homicidal Thoughts:  No  Memory:  recent and remote fair   Judgement:  Impaired  Insight:  Lacking  Psychomotor Activity:  Decreased- no agitation or restlessness at this time  Concentration:  Concentration:  Fair and Attention Span: Fair  Recall:  [employer] of Knowledge:  Fair  Language:  Fair  Akathisia:  Negative  Handed:  Right  AIMS (if indicated):     Assets:  Financial Resources/Insurance Resilience  ADL's:  Impaired  Cognition:  Impaired,  Mild  Sleep:  Number of Hours: 4.5      COGNITIVE FEATURES THAT CONTRIBUTE TO RISK:  Closed-mindedness and Loss of executive function    SUICIDE RISK:   Moderate:  Frequent suicidal ideation with limited intensity, and duration, some specificity in terms of plans, no associated intent, good self-control, limited dysphoria/symptomatology, some risk factors present, and identifiable protective factors, including available and accessible social support.  PLAN OF CARE: Patient will be admitted to inpatient psychiatric unit for stabilization and safety. Will provide and encourage milieu participation. Provide medication management and maked adjustments as needed.  Will follow daily.    I certify that inpatient services furnished can reasonably be expected to improve the patient's condition.   Craige Cotta, MD 07/07/2017, 1:59 PM

## 2017-07-07 NOTE — Plan of Care (Signed)
Patient denies SI and is free from injury or self harm behavior. Patient not participating in group sessions. Patient appears paranoid and withdrawn.

## 2017-07-07 NOTE — Progress Notes (Signed)
D: Patient presents depressed, guarded, irritable. Patient refused am vitals. Per report, patient has motor tic that causes him to be unsteady/fall. No falls this shift, and appears to be stable on his feet. Patient had minimal interaction, limited responses to staff. Patient denied experiencing SI/ HI/AVH, and was not seen responding to internal stimuli. Patient does appear suspicious of staff, however. Patient arrived last night, and has remained in bed most of the morning.  A: Patient checked q15 min, and checks reviewed. Wheelchair provided and encouraged when symptomatic, room near nurse's station, fall risk bracelet and socks provided. Reviewed medication changes with patient and educated on side effects. Educated patient on importance of attending group therapy sessions and educated on several coping skills. Encouarged participation in milieu through recreation therapy and attending meals with peers.  R: Patient was not receptive to education on medications, but is medication compliant. Patient did not attend groups or fill out a self inventory. Patient contracts for safety on the unit.

## 2017-07-08 DIAGNOSIS — M7918 Myalgia, other site: Secondary | ICD-10-CM

## 2017-07-08 DIAGNOSIS — R451 Restlessness and agitation: Secondary | ICD-10-CM

## 2017-07-08 DIAGNOSIS — F29 Unspecified psychosis not due to a substance or known physiological condition: Secondary | ICD-10-CM

## 2017-07-08 MED ORDER — PALIPERIDONE PALMITATE ER 156 MG/ML IM SUSY
156.0000 mg | PREFILLED_SYRINGE | INTRAMUSCULAR | Status: DC
  Administered 2017-07-11: 156 mg via INTRAMUSCULAR

## 2017-07-08 MED ORDER — PALIPERIDONE PALMITATE ER 234 MG/1.5ML IM SUSY
234.0000 mg | PREFILLED_SYRINGE | Freq: Once | INTRAMUSCULAR | Status: AC
  Administered 2017-07-08: 234 mg via INTRAMUSCULAR

## 2017-07-08 MED ORDER — ENSURE ENLIVE PO LIQD
237.0000 mL | ORAL | Status: DC
  Administered 2017-07-08 – 2017-07-17 (×8): 237 mL via ORAL

## 2017-07-08 MED ORDER — IBUPROFEN 400 MG PO TABS: 400.0000 mg | ORAL_TABLET | Freq: Four times a day (QID) | ORAL | Status: DC | PRN

## 2017-07-08 NOTE — Progress Notes (Signed)
NUTRITION ASSESSMENT  Pt identified as at risk on the Malnutrition Screen Tool  INTERVENTION: 1. Educated patient on the importance of nutrition and encouraged intake of food and beverages. 2. Discussed weight goals. 3. Supplements: will order Ensure Enlive once/day, this supplement provides 350 kcal and 20 grams of protein.  NUTRITION DIAGNOSIS: Unintentional weight loss related to sub-optimal intake as evidenced by pt report.   Goal: Pt to meet >/= 90% of their estimated nutrition needs.  Monitor:  PO intake  Assessment:  Patient has a hx of schizophrenia and was admitted under IVC d/t not taking medications and not taking care of himself/basic ADL to include not bathing and not eating regularly, per notes. Notes also indicate that pt had been displaying aggressive behavior PTA and was punching holes into walls and destroying property.   Limited weight hx available in the chart. From the chart review, it appears that pt has lost 35 lbs (18% body weight) in the past 4.5 years. This is not significant for time frame but unable to determine if weight loss may have occurred more acutely.   Will order Ensure once/day. Continue to encourage PO intakes of meals, supplement, and snacks.   42 y.o. male  Height: Ht Readings from Last 1 Encounters:  07/07/17 5' 9.5" (1.765 m)    Weight: Wt Readings from Last 1 Encounters:  07/07/17 155 lb (70.3 kg)    Weight Hx: Wt Readings from Last 10 Encounters:  07/07/17 155 lb (70.3 kg)  01/11/13 190 lb (86.2 kg)  10/27/11 189 lb (85.7 kg)  08/08/11 183 lb 11.2 oz (83.3 kg)    BMI:  Body mass index is 22.56 kg/m. Pt meets criteria for normal weight based on current BMI.  Estimated Nutritional Needs: Kcal: 25-30 kcal/kg Protein: > 1 gram protein/kg Fluid: 1 ml/kcal  Diet Order:  Diet Order           Diet regular Room service appropriate? No; Fluid consistency: Thin  Diet effective now         Pt is also offered choice of unit  snacks mid-morning and mid-afternoon.  Pt is eating as desired.   Lab results and medications reviewed.      Trenton Gammon, MS, RD, LDN, [facility] Inpatient Clinical Dietitian Pager # [phone] After hours/weekend pager # [phone]

## 2017-07-08 NOTE — Progress Notes (Signed)
[facility] MD Progress Note  07/08/2017 1:03 PM Bryan [name]  MRN:  [id number] Subjective:  "I'm doing OK. My thoughts are out of control. I need more self-control."  Objective: Pt seen and chart reviewed. Pt is alert/oriented x4, calm, cooperative, and appropriate to situation. Pt denies suicidal/homicidal ideation and psychosis. Pt does not appear to be responding to internal stimuli, yet does pause for longer moments and is somewhat internally preoccupied at times during the assessment. However, pt is able to redirect his own thoughts without prompting, and presents with a seemingly genuine desire to improve through medication management. Pt presets as concerned about his thoughts being difficult to control and would like to try [place] as a means to assist with long-term treatment and easier compliance.   Principal Problem: Undifferentiated schizophrenia (HCC) Diagnosis:   Patient Active Problem List   Diagnosis Date Noted  . Undifferentiated schizophrenia (HCC) [F20.3]   . 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 Illiterate [Z55.0] 10/30/2011  . Schizoaffective disorder, depressive type (HCC) [F25.1] 10/27/2011  . Schizoaffective disorder (HCC) [F25.9] 08/10/2011  . Observed seizure-like activity (HCC) [R56.9] 08/09/2011  . PAIN IN JOINT, MULTIPLE SITES [M25.50] 02/16/2010  . FATIGUE [R53.81, R53.83] 02/16/2010  . Shortness of breath [R06.02] 02/16/2010   Total Time spent with patient: 25 minutes  Past Psychiatric History: see H&P  Past Medical History:  Past Medical History:  Diagnosis Date  . Hypertension   . Psychiatric problem    Seen at [facility] for unknown reason. Takes seroquel. History of hearing voices.  Not commandivng voices.   . Schizophrenia (HCC)   . Tremors of nervous system     Past Surgical History:  Procedure Laterality Date  . none     Family History: History reviewed. No pertinent family history. Family Psychiatric  History: see H&P Social History:   Social History   Substance and Sexual Activity  Alcohol Use Yes   Comment: says drinks a 40 at least once a week. denies drink in 2-3 weeks.      Social History   Substance and Sexual Activity  Drug Use Yes  . Types: Cocaine, Marijuana   Comment: marijuana somewhat regularly    Social History   Socioeconomic History  . Marital status: Married    Spouse name: Not on file  . Number of children: Not on file  . Years of education: Not on file  . Highest education level: Not on file  Occupational History  . Not on file  Social Needs  . Financial resource strain: Not on file  . Food insecurity:    Worry: Not on file    Inability: Not on file  . Transportation needs:    Medical: Not on file    Non-medical: Not on file  Tobacco Use  . Smoking status: Current Every Day Smoker    Packs/day: 0.50    Types: Cigarettes  . Smokeless tobacco: Never Used  Substance and Sexual Activity  . Alcohol use: Yes    Comment: says drinks a 40 at least once a week. denies drink in 2-3 weeks.   . Drug use: Yes    Types: Cocaine, Marijuana    Comment: marijuana somewhat regularly  . Sexual activity: Never  Lifestyle  . Physical activity:    Days per week: Not on file    Minutes per session: Not on file  . Stress: Not on file  Relationships  . Social connections:    Talks on phone: Not on file  Gets together: Not on file    Attends religious service: Not on file    Active member of club or organization: Not on file    Attends meetings of clubs or organizations: Not on file    Relationship status: Not on file  Other Topics Concern  . Not on file  Social History Narrative   LIves in [place] in an apartment with his brother. Unclear why on medicaid.   Additional Social History:                         Sleep: Fair  Appetite:  Good  Current Medications: Current Facility-Administered Medications  Medication Dose Route Frequency Provider Last Rate Last Dose  .  acetaminophen (TYLENOL) tablet 650 mg  650 mg Oral Q6H PRN Laveda Abbe, NP      . alum & mag hydroxide-simeth (MAALOX/MYLANTA) 200-200-20 MG/5ML suspension 30 mL  30 mL Oral Q4H PRN Laveda Abbe, NP      . feeding supplement (ENSURE ENLIVE) (ENSURE ENLIVE) liquid 237 mL  237 mL Oral Q24H Micheal Likens, MD   237 mL at 07/08/17 1000  . gabapentin (NEURONTIN) capsule 100 mg  100 mg Oral BID Cobos, Rockey Situ, MD   100 mg at 07/08/17 0752  . LORazepam (ATIVAN) tablet 1 mg  1 mg Oral Q6H PRN Cobos, Rockey Situ, MD   1 mg at 07/07/17 2049  . magnesium hydroxide (MILK OF MAGNESIA) suspension 30 mL  30 mL Oral Daily PRN Laveda Abbe, NP      . paliperidone (INVEGA SUSTENNA) injection 234 mg  234 mg Intramuscular Once Micheal Likens, MD       Followed by  . [START ON 07/11/2017] paliperidone (INVEGA SUSTENNA) injection 156 mg  156 mg Intramuscular Q30 days Jolyne Loa T, MD      . paliperidone (INVEGA) 24 hr tablet 6 mg  6 mg Oral Daily Laveda Abbe, NP   6 mg at 07/08/17 0752  . traZODone (DESYREL) tablet 50 mg  50 mg Oral QHS PRN Laveda Abbe, NP   50 mg at 07/07/17 2049    Lab Results: No results found for this or any previous visit (from the past 48 hour(s)).  Blood Alcohol level:  Lab Results  Component Value Date   ETH <10 07/05/2017   ETH <10 02/16/2017    Metabolic Disorder Labs: No results found for: HGBA1C, MPG No results found for: PROLACTIN No results found for: CHOL, TRIG, HDL, CHOLHDL, VLDL, LDLCALC  Physical Findings: AIMS: Facial and Oral Movements Muscles of Facial Expression: None, normal Lips and Perioral Area: None, normal Jaw: None, normal Tongue: None, normal,Extremity Movements Upper (arms, wrists, hands, fingers): None, normal Lower (legs, knees, ankles, toes): None, normal, Trunk Movements Neck, shoulders, hips: None, normal, Overall Severity Severity of abnormal movements (highest score  from questions above): None, normal Incapacitation due to abnormal movements: None, normal Patient's awareness of abnormal movements (rate only patient's report): No Awareness, Dental Status Current problems with teeth and/or dentures?: No Does patient usually wear dentures?: No  CIWA:  CIWA-Ar Total: 0 COWS:     Musculoskeletal: Strength & Muscle Tone: within normal limits Gait & Station: pt reports some lethargy and asked for a wheelchair Patient leans: N/A  Psychiatric Specialty Exam: Physical Exam  Review of Systems  Psychiatric/Behavioral: Positive for depression and hallucinations. Negative for substance abuse and suicidal ideas. The patient is nervous/anxious and has insomnia.   All  other systems reviewed and are negative.   Blood pressure 109/74, pulse 76, temperature 97.9 F (36.6 C), temperature source Oral, resp. rate 18, height 5' 9.5" (1.765 m), weight 70.3 kg (155 lb).Body mass index is 22.56 kg/m.  General Appearance: Casual and Fairly Groomed  Eye Contact:  Good  Speech:  Clear and Coherent and Normal Rate  Volume:  Normal  Mood:  Anxious  Affect:  Appropriate and Congruent  Thought Process:  Coherent, Goal Directed and Descriptions of Associations: Loose  Orientation:  Full (Time, Place, and Person)  Thought Content:  improving, focused on medications and controlling his thoughts  Suicidal Thoughts:  No  Homicidal Thoughts:  No  Memory:  Immediate;   Fair Recent;   Fair Remote;   Fair  Judgement:  Fair  Insight:  Fair  Psychomotor Activity:  Normal  Concentration:  Concentration: Fair and Attention Span: Fair  Recall:  [employer] of Knowledge:  Fair  Language:  Fair  Akathisia:  No  Handed:    AIMS (if indicated):     Assets:  Communication Skills Desire for Improvement Resilience Social Support  ADL's:  Intact  Cognition:  WNL  Sleep:  Number of Hours: 6.75     Treatment Plan Summary: Undifferentiated schizophrenia (HCC) unstable, yet  improving, managed as below:  Medications: -Start [place]  IM x1 today, then -2nd Invega Sustenna  IM x 1 on 07/11/17 -Continue Invega  PO QD for psychosis -Continue Ativan  PO Q6H PRN anxiety/agitation -Continue Trazodone  PO QHS PRN Insomnia -Remind nurses to give pt some Tylenol or Ibuprofen PRN for pt's musculoskeletal pain/concerns  Non-pharmacologic:  -Reviewed EKG from afternoon of 07/07/17, Qtc 397 -No recent labs thus far to review -Continue to encourage participation in group therapy and interaction with others in the milieu (pt receptive to this idea) -Pt to continue to coordinate with SW to plan discharge   Beau Fanny, FNP 07/08/2017, 1:03 PM

## 2017-07-08 NOTE — Plan of Care (Signed)
D: Pt denies SI/HI/AVH. Pt is pleasant and cooperative. Pt isolated to his room this evening, pt came out to get snack and medication  A: Pt was offered support and encouragement. Pt was given scheduled medications. Pt was encourage to attend groups. Q 15 minute checks were done for safety.  R:safety maintained on unit.   Problem: Activity: Goal: Sleeping patterns will improve Outcome: Progressing   Problem: Health Behavior/Discharge Planning: Goal: Compliance with prescribed medication regimen will improve Outcome: Progressing

## 2017-07-08 NOTE — Progress Notes (Signed)
D:  Patient's self inventory sheet, patient has fair sleep, sleep medication helpful.  Fair appetite, normal energy level, good concentration.  Rated depression 3, denied hopeless and anxiety.   Withdrawals.  Denied SI.  Denied physical problems.  Denied physical pain.  Goal is have more and OK thoughts.  Plans to think things through.  No discharge plans. A:  Medications administered per MD orders.   Emotional support and encouragement given patient. R:  Denied SI and HI, contracts for safety.  Denied A/V hallucinations.  Safety maintained with 15 minute checks. Patient stated sometime it is difficult to swallow.  Stated he feels very tired, needs to rest up.  Wants to get his medicines straight.  Plans to focus better. Patient has been asked throughout the day if he needed pain medication, and patient always refuses pain medication. Presently patient is resting in bed after eating his dinner meal.

## 2017-07-08 NOTE — B[facility] Counselor (Signed)
Attempt made to complete PSA.  Pt was unable to provide information or answer questions.  Pt exhibiting jerking movements. Garner Nash, MSW, LCSW Clinical Social Worker 07/08/2017 2:06 PM

## 2017-07-08 NOTE — Progress Notes (Signed)
Recreation Therapy Notes  INPATIENT RECREATION THERAPY ASSESSMENT  Patient Details Name: Bryan [name] MRN: [id number] DOB: [birth year] Today's Date: 07/08/2017       Information Obtained From: Patient  Able to Participate in Assessment/Interview: Yes  Patient Presentation: Alert(Responding to internal stimuli)  Reason for Admission (Per Patient): Other (Comments)(Anxious, nervous)  Patient Stressors: (No stressors identified)  Coping Skills:   Meditate, Talk, Avoidance  Leisure Interests (2+):  Music - Listen, Individual - TV, Sports - Other (Comment)  Frequency of Recreation/Participation: (Pt stated he doesn't do them anymore)  Awareness of Community Resources:  No  Community Resources:     Current Use:    If no, Barriers?:    Expressed Interest in [employer] Information:    [place] of Residence:  [place]  Patient Main Form of Transportation: Walk  Patient Strengths:  Couldn't identify  Patient Identified Areas of Improvement:  Couldn't identify  Patient Goal for Hospitalization:  Couldn't identify  Current SI (including self-harm):  No  Current HI:  No  Current AVH: No  Staff Intervention Plan: Group Attendance, Collaborate with Interdisciplinary Treatment Team  Consent to Intern Participation: N/A     Caroll Rancher, LRT/CTRS  Caroll Rancher A 07/08/2017, 1:49 PM

## 2017-07-08 NOTE — Plan of Care (Signed)
Nurse discussed depression, anxiety and coping skills with patient.  

## 2017-07-08 NOTE — Tx Team (Signed)
Interdisciplinary Treatment and Diagnostic Plan Update  07/08/2017 Time of Session: 0929 Bryan [name] MRN: [id number]  Principal Diagnosis: Undifferentiated schizophrenia [facility])  Secondary Diagnoses: Principal Problem:   Undifferentiated schizophrenia ([place]) Active Problems:   Delusional disorder ([place])   Current Medications:  Current Facility-Administered Medications  Medication Dose Route Frequency Provider Last Rate Last Dose  . acetaminophen (TYLENOL) tablet 650 mg  650 mg Oral Q6H PRN Ethelene Hal, NP      . alum & mag hydroxide-simeth (MAALOX/MYLANTA) 200-200-20 MG/5ML suspension 30 mL  30 mL Oral Q4H PRN Ethelene Hal, NP      . feeding supplement (ENSURE ENLIVE) (ENSURE ENLIVE) liquid 237 mL  237 mL Oral Q24H Pennelope Bracken, MD   237 mL at 07/08/17 1000  . gabapentin (NEURONTIN) capsule 100 mg  100 mg Oral BID Cobos, Myer Peer, MD   100 mg at 07/08/17 0752  . LORazepam (ATIVAN) tablet 1 mg  1 mg Oral Q6H PRN Cobos, Myer Peer, MD   1 mg at 07/07/17 2049  . magnesium hydroxide (MILK OF MAGNESIA) suspension 30 mL  30 mL Oral Daily PRN Ethelene Hal, NP      . paliperidone (INVEGA) 24 hr tablet 6 mg  6 mg Oral Daily Ethelene Hal, NP   6 mg at 07/08/17 0752  . traZODone (DESYREL) tablet 50 mg  50 mg Oral QHS PRN Ethelene Hal, NP   50 mg at 07/07/17 2049   PTA Medications: Medications Prior to Admission  Medication Sig Dispense Refill Last Dose  . benztropine (COGENTIN) 1 MG tablet Take 1 mg by mouth 2 (two) times daily.   unknown  . diphenhydrAMINE (BENADRYL) 25 MG tablet Take 1 tablet (25 mg total) by mouth every 6 (six) hours. (Patient not taking: Reported on 07/05/2017) 20 tablet 0 Not Taking at Unknown time  . fluPHENAZine (PROLIXIN) 10 MG tablet Take 10-20 mg by mouth daily. Take 63m by mouth daily for 7 seven days, then take 283mby mouth thereafter daily.   unknown  . OLANZapine (ZYPREXA) 5 MG tablet Take 5 mg by mouth at  bedtime.   unknown    Patient Stressors: Medication change or noncompliance Substance abuse  Patient Strengths: Average or above average intelligence Supportive family/friends  Treatment Modalities: Medication Management, Group therapy, Case management,  1 to 1 session with clinician, Psychoeducation, Recreational therapy.   Physician Treatment Plan for Primary Diagnosis: Undifferentiated schizophrenia ([place] Goal(s): Improvement in symptoms so as ready for discharge Improvement in symptoms so as ready for discharge   Short Term Goals: Ability to demonstrate self-control will improve Ability to maintain clinical measurements within normal limits will improve Compliance with prescribed medications will improve  Medication Management: Evaluate patient's response, side effects, and tolerance of medication regimen.  Therapeutic Interventions: 1 to 1 sessions, Unit Group sessions and Medication administration.  Evaluation of Outcomes: Not Met  Physician Treatment Plan for Secondary Diagnosis: Principal Problem:   Undifferentiated schizophrenia (HCCoyActive Problems:   Delusional disorder (HCSult[place] Long Term Goal(s): Improvement in symptoms so as ready for discharge Improvement in symptoms so as ready for discharge   Short Term Goals: Ability to demonstrate self-control will improve Ability to maintain clinical measurements within normal limits will improve Compliance with prescribed medications will improve     Medication Management: Evaluate patient's response, side effects, and tolerance of medication regimen.  Therapeutic Interventions: 1 to 1 sessions, Unit Group sessions and Medication administration.  Evaluation of Outcomes: Not Met  RN Treatment Plan for Primary Diagnosis: Undifferentiated schizophrenia ([place]) Long Term Goal(s): Knowledge of disease and therapeutic regimen to maintain health will improve  Short Term Goals: Ability to identify and develop  effective coping behaviors will improve and Compliance with prescribed medications will improve  Medication Management: RN will administer medications as ordered by provider, will assess and evaluate patient's response and provide education to patient for prescribed medication. RN will report any adverse and/or side effects to prescribing provider.  Therapeutic Interventions: 1 on 1 counseling sessions, Psychoeducation, Medication administration, Evaluate responses to treatment, Monitor vital signs and CBGs as ordered, Perform/monitor CIWA, COWS, AIMS and Fall Risk screenings as ordered, Perform wound care treatments as ordered.  Evaluation of Outcomes: Not Met   LCSW Treatment Plan for Primary Diagnosis: Undifferentiated schizophrenia ([place]) Long Term Goal(s): Safe transition to appropriate next level of care at discharge, Engage patient in therapeutic group addressing interpersonal concerns.  Short Term Goals: Engage patient in aftercare planning with referrals and resources, Increase social support and Increase skills for wellness and recovery  Therapeutic Interventions: Assess for all discharge needs, 1 to 1 time with Social worker, Explore available resources and support systems, Assess for adequacy in community support network, Educate family and significant other(s) on suicide prevention, Complete Psychosocial Assessment, Interpersonal group therapy.  Evaluation of Outcomes: Not Met   Progress in Treatment: Attending groups: No. Participating in groups: No. Taking medication as prescribed: Yes. Toleration medication: Yes. Family/Significant other contact made: No, will contact:  when given permission Patient understands diagnosis: Yes. Discussing patient identified problems/goals with staff: Yes. Medical problems stabilized or resolved: Yes. Denies suicidal/homicidal ideation: Yes. Issues/concerns per patient self-inventory: No. Other: none  New problem(s) identified: No,  Describe:  none  New Short Term/Long Term Goal(s):Pt goal: "do better than what I have been doing: chemical imbalance, anxiety"  Discharge Plan or Barriers:   Reason for Continuation of Hospitalization: Aggression Medication stabilization  Estimated Length of Stay: 3-5 days.  Attendees: Patient: Bryan [name] 07/08/2017   Physician: Dr Nancy Fetter 07/08/2017   Nursing: Grayland Ormond, RN 07/08/2017   RN Care Manager: 07/08/2017   Social Worker: [name], LCSW 07/08/2017   Recreational Therapist:  07/08/2017   Other:  07/08/2017   Other:  07/08/2017   Other: 07/08/2017        Scribe for Treatment Team: Joanne Chars, LCSW 07/08/2017 10:26 AM

## 2017-07-08 NOTE — Progress Notes (Signed)
Recreation Therapy Notes  Date: 5.13.19 Time: 0950 Location: [address] Dayroom  Group Topic: Goal Setting  Goal Area(s) Addresses:  Patient will be able to identify at least 3 life goals.  Patient will be able to identify benefit of investing in goals.  Patient will be able to identify benefit of setting life goals.   Intervention: Worksheet  Activity: Garment/textile technologist.  Patients were to identify goals they wanted to accomplish in a week, month, year and 5 years.  Patients were to then identify any obstacles they would face, what they need to accomplish their goal and what they can start doing to reach their goals.  Education:  Discharge Planning, Pharmacologist, Leisure Education   Education Outcome: Acknowledges Education/In Group Clarification Provided/Needs Additional Education  Clinical Observations: Pt did not attend group.    Caroll Rancher, LRT/CTRS         Lillia Abed, Rosely Fernandez A 07/08/2017 11:12 AM

## 2017-07-08 NOTE — [facility] Group Notes (Signed)
Patient did not attend Orientation and Goals Group.  

## 2017-07-08 NOTE — [facility] Group Notes (Signed)
[facility] LCSW Group Therapy Note  Date/Time: 07/08/17, 1315  Type of Therapy and Topic:  Group Therapy:  Overcoming Obstacles  Participation Level:  Did not attend  Description of Group:    In this group patients will be encouraged to explore what they see as obstacles to their own wellness and recovery. They will be guided to discuss their thoughts, feelings, and behaviors related to these obstacles. The group will process together ways to cope with barriers, with attention given to specific choices patients can make. Each patient will be challenged to identify changes they are motivated to make in order to overcome their obstacles. This group will be process-oriented, with patients participating in exploration of their own experiences as well as giving and receiving support and challenge from other group members.  Therapeutic Goals: 1. Patient will identify personal and current obstacles as they relate to admission. 2. Patient will identify barriers that currently interfere with their wellness or overcoming obstacles.  3. Patient will identify feelings, thought process and behaviors related to these barriers. 4. Patient will identify two changes they are willing to make to overcome these obstacles:    Summary of Patient Progress      Therapeutic Modalities:   Cognitive Behavioral Therapy Solution Focused Therapy Motivational Interviewing Relapse Prevention Therapy  Greg Cashtyn Pouliot, LCSW 

## 2017-07-09 DIAGNOSIS — G253 Myoclonus: Secondary | ICD-10-CM

## 2017-07-09 NOTE — Progress Notes (Signed)
Psychoeducational Group Note  Date:  07/09/2017 Time: 2056  Group Topic/Focus:  Wrap-Up Group:   The focus of this group is to help patients review their daily goal of treatment and discuss progress on daily workbooks.  Participation Level: Did Not Attend  Participation Quality:  Not Applicable  Affect:  Not Applicable  Cognitive:  Not Applicable  Insight:  Not Applicable  Engagement in Group: Not Applicable  Additional Comments:  The patient did not attend group this evening.   Hazle Coca S 07/09/2017, 8:56 PM

## 2017-07-09 NOTE — Progress Notes (Signed)
D:Pt was observed lying in his bed with his lips moving and no sound when writer entered his room this morning. Pt took his medications without complaint. Pt has a flat affect and does not initate conversation. A:Offered support, encouragement and 15 minute checks. R:Pt denies si and hi. Safety maintained on the unit.

## 2017-07-09 NOTE — [facility] Group Notes (Signed)
[facility] LCSW Group Therapy Note  Date/Time: 07/09/17, 1315  Type of Therapy/Topic:  Group Therapy:  Feelings about Diagnosis  Participation Level:  Did Not Attend   Mood:   Description of Group:    This group will allow patients to explore their thoughts and feelings about diagnoses they have received. Patients will be guided to explore their level of understanding and acceptance of these diagnoses. Facilitator will encourage patients to process their thoughts and feelings about the reactions of others to their diagnosis, and will guide patients in identifying ways to discuss their diagnosis with significant others in their lives. This group will be process-oriented, with patients participating in exploration of their own experiences as well as giving and receiving support and challenge from other group members.   Therapeutic Goals: 1. Patient will demonstrate understanding of diagnosis as evidence by identifying two or more symptoms of the disorder:  2. Patient will be able to express two feelings regarding the diagnosis 3. Patient will demonstrate ability to communicate their needs through discussion and/or role plays  Summary of Patient Progress:        Therapeutic Modalities:   Cognitive Behavioral Therapy Brief Therapy Feelings Identification   Greg Leelynd Maldonado, LCSW 

## 2017-07-09 NOTE — Plan of Care (Signed)
D: Pt continues to be paranoid, pt keeps to his room. No tic activity observed this evening A: Pt was offered support and encouragement. Pt was given scheduled medications. Pt was encourage to attend groups. Q 15 minute checks were done for safety.   R:safety maintained on unit.   Problem: Safety: Goal: Ability to redirect hostility and anger into socially appropriate behaviors will improve Outcome: Progressing   Problem: Safety: Goal: Ability to remain free from injury will improve Outcome: Progressing

## 2017-07-09 NOTE — Progress Notes (Signed)
Recreation Therapy Notes  Date: 5.14.19 Time: 0950 Location: 500 [address] Dayroom  Group Topic: Coping Skills  Goal Area(s) Addresses:  Patient will be able to identify positive coping skills. Patients will be able to identify benefits of using coping skills post d/c.  Intervention: Scissors, glue, magazines, worksheet  Activity: Coping Skills Collage.  Patients were given a worksheet that was divided into 5 categories (diversions, social, cognitive, tension releasers and physical).  Patients were to find pictures from the magazines that represent each category that could be used as coping skills.  Education: Coping Skills, Discharge Planning.   Education Outcome: Acknowledges understanding/In group clarification offered/Needs additional education.   Clinical Observations/Feedback: Pt did not attend group.    Ashtin Melichar, LRT/CTRS         Niajah Sipos A 07/09/2017 11:08 AM 

## 2017-07-09 NOTE — Progress Notes (Signed)
[facility] MD Progress Note  07/09/2017 11:37 AM Bryan [name]  MRN:  [id number] Subjective:    Bryan [name] is a 42 y/o M with history of schizophrenia admitted on IVC placed by family due to not taking care of ADL's, agitation episodes of destroying his home/punching holes in walls, and poor medication adherence. Pt was disorganized, flat, and thought blocking on initial presentation. Pt has also been reporting new-onset of myoclonic jerks which have continued during his hospitalization. He was started on [place] which he tolerated without difficulty, and he was transitioned to long-acting injectable form of [place].  Today upon evaluation, pt remains somewhat disorganized, but he is generally cooperative with the interview. He shares, "I'm alright. My situation should be improving." Pt was asked for more clarification of his statement, but he remained somewhat vague and circumstantial. He adds, "I was hoping my system would be cleansed like it should be." Pt denies any specific concerns. He denies SI/HI/AH/VH. He reports that he is tolerating his medications without difficulty or side effects. He is in agreement to continue his current regimen without changes. He continues to display some myclonic jerks during interview, but he reports they are not intrusive or bothersome. He reports that they had recent onset in the past few weeks, and he does not associate them with anything. Pt is in agreement to referral to transition care team from [facility]. He is in agreement to allow treatment team to contact his mother for additional collateral information. He had no further questions, comments, or concerns.  Principal Problem: Undifferentiated schizophrenia (HCC) Diagnosis:   Patient Active Problem List   Diagnosis Date Noted  . Undifferentiated schizophrenia (HCC) [F20.3]   . 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 Illiterate [Z55.0] 10/30/2011  . Schizoaffective disorder, depressive type (HCC) [F25.1]  10/27/2011  . Schizoaffective disorder (HCC) [F25.9] 08/10/2011  . Observed seizure-like activity (HCC) [R56.9] 08/09/2011  . PAIN IN JOINT, MULTIPLE SITES [M25.50] 02/16/2010  . FATIGUE [R53.81, R53.83] 02/16/2010  . Shortness of breath [R06.02] 02/16/2010   Total Time spent with patient: 30 minutes  Past Psychiatric History: see H&P  Past Medical History:  Past Medical History:  Diagnosis Date  . Hypertension   . Psychiatric problem    Seen at [facility] for unknown reason. Takes seroquel. History of hearing voices.  Not commandivng voices.   . Schizophrenia (HCC)   . Tremors of nervous system     Past Surgical History:  Procedure Laterality Date  . none     Family History: History reviewed. No pertinent family history. Family Psychiatric  History: see H&P Social History:  Social History   Substance and Sexual Activity  Alcohol Use Yes   Comment: says drinks a 40 at least once a week. denies drink in 2-3 weeks.      Social History   Substance and Sexual Activity  Drug Use Yes  . Types: Cocaine, Marijuana   Comment: marijuana somewhat regularly    Social History   Socioeconomic History  . Marital status: Married    Spouse name: Not on file  . Number of children: Not on file  . Years of education: Not on file  . Highest education level: Not on file  Occupational History  . Not on file  Social Needs  . Financial resource strain: Not on file  . Food insecurity:    Worry: Not on file    Inability: Not on file  . Transportation needs:    Medical: Not on file  Non-medical: Not on file  Tobacco Use  . Smoking status: Current Every Day Smoker    Packs/day: 0.50    Types: Cigarettes  . Smokeless tobacco: Never Used  Substance and Sexual Activity  . Alcohol use: Yes    Comment: says drinks a 40 at least once a week. denies drink in 2-3 weeks.   . Drug use: Yes    Types: Cocaine, Marijuana    Comment: marijuana somewhat regularly  . Sexual activity: Never   Lifestyle  . Physical activity:    Days per week: Not on file    Minutes per session: Not on file  . Stress: Not on file  Relationships  . Social connections:    Talks on phone: Not on file    Gets together: Not on file    Attends religious service: Not on file    Active member of club or organization: Not on file    Attends meetings of clubs or organizations: Not on file    Relationship status: Not on file  Other Topics Concern  . Not on file  Social History Narrative   LIves in [place] in an apartment with his brother. Unclear why on medicaid.   Additional Social History:                         Sleep: Good  Appetite:  Good  Current Medications: Current Facility-Administered Medications  Medication Dose Route Frequency Provider Last Rate Last Dose  . acetaminophen (TYLENOL) tablet 650 mg  650 mg Oral Q6H PRN Laveda Abbe, NP      . alum & mag hydroxide-simeth (MAALOX/MYLANTA) 200-200-20 MG/5ML suspension 30 mL  30 mL Oral Q4H PRN Laveda Abbe, NP      . feeding supplement (ENSURE ENLIVE) (ENSURE ENLIVE) liquid 237 mL  237 mL Oral Q24H Micheal Likens, MD   237 mL at 07/09/17 0819  . gabapentin (NEURONTIN) capsule 100 mg  100 mg Oral BID Cobos, Rockey Situ, MD   100 mg at 07/09/17 0813  . ibuprofen (ADVIL,MOTRIN) tablet 400 mg  400 mg Oral Q6H PRN Withrow, Everardo All, FNP      . LORazepam (ATIVAN) tablet 1 mg  1 mg Oral Q6H PRN Cobos, Rockey Situ, MD   1 mg at 07/08/17 2117  . magnesium hydroxide (MILK OF MAGNESIA) suspension 30 mL  30 mL Oral Daily PRN Laveda Abbe, NP      . Melene Muller ON 07/11/2017] paliperidone (INVEGA SUSTENNA) injection 156 mg  156 mg Intramuscular Q30 days Jolyne Loa T, MD      . paliperidone (INVEGA) 24 hr tablet 6 mg  6 mg Oral Daily Laveda Abbe, NP   6 mg at 07/09/17 0813  . traZODone (DESYREL) tablet 50 mg  50 mg Oral QHS PRN Laveda Abbe, NP   50 mg at 07/08/17 2117    Lab  Results: No results found for this or any previous visit (from the past 48 hour(s)).  Blood Alcohol level:  Lab Results  Component Value Date   ETH <10 07/05/2017   ETH <10 02/16/2017    Metabolic Disorder Labs: No results found for: HGBA1C, MPG No results found for: PROLACTIN No results found for: CHOL, TRIG, HDL, CHOLHDL, VLDL, LDLCALC  Physical Findings: AIMS: Facial and Oral Movements Muscles of Facial Expression: None, normal Lips and Perioral Area: None, normal Jaw: None, normal Tongue: None, normal,Extremity Movements Upper (arms, wrists, hands, fingers): None, normal Lower (  legs, knees, ankles, toes): None, normal, Trunk Movements Neck, shoulders, hips: None, normal, Overall Severity Severity of abnormal movements (highest score from questions above): None, normal Incapacitation due to abnormal movements: None, normal Patient's awareness of abnormal movements (rate only patient's report): No Awareness, Dental Status Current problems with teeth and/or dentures?: No Does patient usually wear dentures?: No  CIWA:  CIWA-Ar Total: 1 COWS:  COWS Total Score: 2  Musculoskeletal: Strength & Muscle Tone: within normal limits Gait & Station: normal Patient leans: N/A  Psychiatric Specialty Exam: Physical Exam  Nursing note and vitals reviewed.   Review of Systems  Constitutional: Negative for chills and fever.  Respiratory: Negative for cough and shortness of breath.   Cardiovascular: Negative for chest pain.  Gastrointestinal: Negative for abdominal pain, heartburn, nausea and vomiting.  Psychiatric/Behavioral: Negative for depression, hallucinations and suicidal ideas. The patient is not nervous/anxious and does not have insomnia.     Blood pressure 98/67, pulse 97, temperature 98.4 F (36.9 C), temperature source Oral, resp. rate 16, height 5' 9.5" (1.765 m), weight 70.3 kg (155 lb).Body mass index is 22.56 kg/m.  General Appearance: Casual and Disheveled  Eye  Contact:  Good  Speech:  Clear and Coherent and Normal Rate  Volume:  Normal  Mood:  Euthymic  Affect:  Flat  Thought Process:  Coherent, Disorganized, Goal Directed and Descriptions of Associations: Circumstantial  Orientation:  Full (Time, Place, and Person)  Thought Content:  Abstract Reasoning  Suicidal Thoughts:  No  Homicidal Thoughts:  No  Memory:  Immediate;   Fair Recent;   Fair Remote;   Fair  Judgement:  Fair  Insight:  Lacking  Psychomotor Activity:  Normal  Concentration:  Concentration: Fair  Recall:  [employer] of Knowledge:  Fair  Language:  Fair  Akathisia:  No  Handed:    AIMS (if indicated):     Assets:  Resilience Social Support  ADL's:  Intact  Cognition:  WNL  Sleep:  Number of Hours: 6.75   Treatment Plan Summary: Daily contact with patient to assess and evaluate symptoms and progress in treatment and Medication management   -Continue inpatient hospitalization  -Schizophrenia   -Continue Invega  po qDay   -Continue Invega Sustenna  IM q30days (starting on 07/11/17; Hinda Glatter Sustenna  IM administered on 07/08/17)  -Anxiety   -Continue gabapentin  po BID   -Continue ativan  po q6h prn anxiety  - Insomnia   -Continue trazodone  po qhs prn insomnia  -Encourage participation in groups and therapeutic milieu  -Disposition planning will be ongoing  Micheal Likens, MD 07/09/2017, 11:37 AM

## 2017-07-09 NOTE — Plan of Care (Signed)
No self injurious behavior observed or expressed. 

## 2017-07-09 NOTE — [facility] Counselor (Signed)
CSW made second attempt to complete PSA with pt.  Pt initially answered several questions but then lapsed back into staring and not responding.  Not able to complete PSA. Garner Nash, MSW, LCSW Clinical Social Worker 07/09/2017 10:11 AM

## 2017-07-09 NOTE — [facility] Suicide Risk Assessment (Signed)
[facility] INPATIENT:  Family/Significant Other Suicide Prevention Education  Suicide Prevention Education:  Contact Attempts:  Bryan [name], mother, [phone] been identified by the patient as the family member/significant other with whom the patient will be residing, and identified as the person(s) who will aid the patient in the event of a mental health crisis.  With written consent from the patient, two attempts were made to provide suicide prevention education, prior to and/or following the patient's discharge.  We were unsuccessful in providing suicide prevention education.  A suicide education pamphlet was given to the patient to share with family/significant other.  Date and time of first attempt:07/09/17, 21 Date and time of second attempt:  Bryan Frederick, LCSW 07/09/2017, 1:09 PM

## 2017-07-10 NOTE — Progress Notes (Signed)
Recreation Therapy Notes  Date: 5.15.19 Time: 1000 Location: [address] Dayroom  Group Topic: Wellness  Goal Area(s) Addresses:  Patient will define components of whole wellness. Patient will verbalize benefit of whole wellness.  Intervention: Exercise  Activity:  Exercise.  LRT discussed with patients the importance of complete wellness.  LRT then lead patients in a series of stretches to loosen them up.  Each patient was to pick an exercise for the group to complete.  The group completed 2 rounds of exercises.  Education: Wellness, Discharge Planning.   Education Outcome: Acknowledges education/In group clarification offered/Needs additional education.   Clinical Observations/Feedback: Pt did not attend group.     Shakeya Kerkman, LRT/CTRS         Hodaya Curto A 07/10/2017 12:36 PM 

## 2017-07-10 NOTE — Progress Notes (Signed)
[facility] MD Progress Note  07/10/2017 1:53 PM Bryan [name]  MRN:  [id number] Subjective:    Bryan [name] is a 42 y/o M with history of schizophrenia admitted on IVC placed by family due to not taking care of ADL's, agitation episodes of destroying his home/punching holes in walls, and poor medication adherence. Pt was disorganized, flat, and thought blocking on initial presentation. Pt has also been reporting new-onset of myoclonic jerks which have continued during his hospitalization, but they appear to occur mostly when interacting with treatment team members suggesting they may be induced by anxiety rather than a neurologic cause. He was started on [place] which he tolerated without difficulty, and he was transitioned to long-acting injectable form of [place] of which he received first dose on 5/13, and we are anticipating booster injection to be given on 5/16. Pt has been reporting improvement of his presenting symptoms.  Today upon evaluation, pt shares, "I'm doing alright." He denies any specific concerns. He is sleeping well. His appetite is good. He denies physical complaints. He denies SI/HI/AH/VH. He is tolerating his current medication without difficulty or side effects, and he is in agreement to continue his current regimen without changes. His mother is planning on visiting today, and we will follow up with her regarding her assessment of how pt is progressing compared to his baseline. Pt will anticipate receiving booster injection of [place] tomorrow, and he is in agreement to continue his current treatment regimen without changes. He is in agreement with the above plan, and he had no further questions, comments, or concerns.   Principal Problem: Undifferentiated schizophrenia (HCC) Diagnosis:   Patient Active Problem List   Diagnosis Date Noted  . Undifferentiated schizophrenia (HCC) [F20.3]   . 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 Illiterate [Z55.0] 10/30/2011  .  Schizoaffective disorder, depressive type (HCC) [F25.1] 10/27/2011  . Schizoaffective disorder (HCC) [F25.9] 08/10/2011  . Observed seizure-like activity (HCC) [R56.9] 08/09/2011  . PAIN IN JOINT, MULTIPLE SITES [M25.50] 02/16/2010  . FATIGUE [R53.81, R53.83] 02/16/2010  . Shortness of breath [R06.02] 02/16/2010   Total Time spent with patient: 30 minutes  Past Psychiatric History: see H&P  Past Medical History:  Past Medical History:  Diagnosis Date  . Hypertension   . Psychiatric problem    Seen at [facility] for unknown reason. Takes seroquel. History of hearing voices.  Not commandivng voices.   . Schizophrenia (HCC)   . Tremors of nervous system     Past Surgical History:  Procedure Laterality Date  . none     Family History: History reviewed. No pertinent family history. Family Psychiatric  History: see H&P Social History:  Social History   Substance and Sexual Activity  Alcohol Use Yes   Comment: says drinks a 40 at least once a week. denies drink in 2-3 weeks.      Social History   Substance and Sexual Activity  Drug Use Yes  . Types: Cocaine, Marijuana   Comment: marijuana somewhat regularly    Social History   Socioeconomic History  . Marital status: Married    Spouse name: Not on file  . Number of children: Not on file  . Years of education: Not on file  . Highest education level: Not on file  Occupational History  . Not on file  Social Needs  . Financial resource strain: Not on file  . Food insecurity:    Worry: Not on file    Inability: Not on file  . Transportation needs:  Medical: Not on file    Non-medical: Not on file  Tobacco Use  . Smoking status: Current Every Day Smoker    Packs/day: 0.50    Types: Cigarettes  . Smokeless tobacco: Never Used  Substance and Sexual Activity  . Alcohol use: Yes    Comment: says drinks a 40 at least once a week. denies drink in 2-3 weeks.   . Drug use: Yes    Types: Cocaine, Marijuana    Comment:  marijuana somewhat regularly  . Sexual activity: Never  Lifestyle  . Physical activity:    Days per week: Not on file    Minutes per session: Not on file  . Stress: Not on file  Relationships  . Social connections:    Talks on phone: Not on file    Gets together: Not on file    Attends religious service: Not on file    Active member of club or organization: Not on file    Attends meetings of clubs or organizations: Not on file    Relationship status: Not on file  Other Topics Concern  . Not on file  Social History Narrative   LI[place] in [place] in an apartment with his brother. Unclear why on medicaid.   Additional Social History:                         Sleep: Fair  Appetite:  Good  Current Medications: Current Facility-Administered Medications  Medication Dose Route Frequency Provider Last Rate Last Dose  . acetaminophen (TYLENOL) tablet 650 mg  650 mg Oral Q6H PRN Laveda Abbe, NP      . alum & mag hydroxide-simeth (MAALOX/MYLANTA) 200-200-20 MG/5ML suspension 30 mL  30 mL Oral Q4H PRN Laveda Abbe, NP      . feeding supplement (ENSURE ENLIVE) (ENSURE ENLIVE) liquid 237 mL  237 mL Oral Q24H Micheal Likens, MD   237 mL at 07/09/17 0819  . gabapentin (NEURONTIN) capsule 100 mg  100 mg Oral BID Cobos, Rockey Situ, MD   100 mg at 07/10/17 0748  . ibuprofen (ADVIL,MOTRIN) tablet 400 mg  400 mg Oral Q6H PRN Withrow, Everardo All, FNP      . LORazepam (ATIVAN) tablet 1 mg  1 mg Oral Q6H PRN Cobos, Rockey Situ, MD   1 mg at 07/08/17 2117  . magnesium hydroxide (MILK OF MAGNESIA) suspension 30 mL  30 mL Oral Daily PRN Laveda Abbe, NP      . Melene Muller ON 07/11/2017] paliperidone (INVEGA SUSTENNA) injection 156 mg  156 mg Intramuscular Q30 days Jolyne Loa T, MD      . paliperidone (INVEGA) 24 hr tablet 6 mg  6 mg Oral Daily Laveda Abbe, NP   6 mg at 07/10/17 0748  . traZODone (DESYREL) tablet 50 mg  50 mg Oral QHS PRN Laveda Abbe, NP   50 mg at 07/08/17 2117    Lab Results: No results found for this or any previous visit (from the past 48 hour(s)).  Blood Alcohol level:  Lab Results  Component Value Date   ETH <10 07/05/2017   ETH <10 02/16/2017    Metabolic Disorder Labs: No results found for: HGBA1C, MPG No results found for: PROLACTIN No results found for: CHOL, TRIG, HDL, CHOLHDL, VLDL, LDLCALC  Physical Findings: AIMS: Facial and Oral Movements Muscles of Facial Expression: None, normal Lips and Perioral Area: None, normal Jaw: None, normal Tongue: None, normal,Extremity Movements Upper (  arms, wrists, hands, fingers): None, normal Lower (legs, knees, ankles, toes): None, normal, Trunk Movements Neck, shoulders, hips: None, normal, Overall Severity Severity of abnormal movements (highest score from questions above): None, normal Incapacitation due to abnormal movements: None, normal Patient's awareness of abnormal movements (rate only patient's report): No Awareness, Dental Status Current problems with teeth and/or dentures?: No Does patient usually wear dentures?: No  CIWA:  CIWA-Ar Total: 1 COWS:  COWS Total Score: 2  Musculoskeletal: Strength & Muscle Tone: within normal limits Gait & Station: normal Patient leans: N/A  Psychiatric Specialty Exam: Physical Exam  Nursing note and vitals reviewed.   Review of Systems  Constitutional: Negative for chills and fever.  Respiratory: Negative for cough and shortness of breath.   Cardiovascular: Negative for chest pain.  Gastrointestinal: Negative for abdominal pain, heartburn, nausea and vomiting.  Psychiatric/Behavioral: Negative for depression, hallucinations and suicidal ideas. The patient is not nervous/anxious and does not have insomnia.     Blood pressure 116/88, pulse 93, temperature 98.4 F (36.9 C), temperature source Oral, resp. rate 16, height 5' 9.5" (1.765 m), weight 70.3 kg (155 lb).Body mass index is 22.56 kg/m.   General Appearance: Casual and Fairly Groomed  Eye Contact:  Good  Speech:  Clear and Coherent and Normal Rate  Volume:  Normal  Mood:  Euthymic  Affect:  Congruent and Flat  Thought Process:  Coherent and Goal Directed  Orientation:  Full (Time, Place, and Person)  Thought Content:  Logical  Suicidal Thoughts:  No  Homicidal Thoughts:  No  Memory:  Immediate;   Fair Recent;   Fair Remote;   Fair  Judgement:  Fair  Insight:  Lacking  Psychomotor Activity:  Normal  Concentration:  Concentration: Fair  Recall:  [employer] of Knowledge:  Fair  Language:  Fair  Akathisia:  No  Handed:    AIMS (if indicated):     Assets:  Manufacturing systems engineer Housing Physical Health Resilience Social Support  ADL's:  Intact  Cognition:  WNL  Sleep:  Number of Hours: 6   Treatment Plan Summary: Daily contact with patient to assess and evaluate symptoms and progress in treatment and Medication management   -Continue inpatient hospitalization  -Schizophrenia             -Continue Invega  po qDay             -Continue Invega Sustenna  IM q30days (starting on 07/11/17; Hinda Glatter Sustenna  IM administered on 07/08/17)  -Anxiety              -Continue gabapentin  po BID             -Continue ativan  po q6h prn anxiety  - Insomnia             -Continue trazodone  po qhs prn insomnia  -Encourage participation in groups and therapeutic milieu  -Disposition planning will be ongoing  Micheal Likens, MD 07/10/2017, 1:53 PM
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
## 2017-07-10 NOTE — Progress Notes (Signed)
Patient has been isolative to room for the majority of the shift.  Patient denies SI, HI and AVH and reports a reduction in psychotic symptoms.  Patient has had no issues of behavioral dsycontrol.  Assess patient for safety, offer medications as prescribed, engage in 1:1 staff talks. Patient able to contract for safety.  Continue to monitor as planned.   

## 2017-07-10 NOTE — [facility] Group Notes (Signed)
  [facility] LCSW Group Therapy Note  Date/Time: 07/10/17, 1315  Type of Therapy/Topic:  Group Therapy:  Emotion Regulation  Participation Level:  Did Not Attend   Mood:  Description of Group:    The purpose of this group is to assist patients in learning to regulate negative emotions and experience positive emotions. Patients will be guided to discuss ways in which they have been vulnerable to their negative emotions. These vulnerabilities will be juxtaposed with experiences of positive emotions or situations, and patients challenged to use positive emotions to combat negative ones. Special emphasis will be placed on coping with negative emotions in conflict situations, and patients will process healthy conflict resolution skills.  Therapeutic Goals: 1. Patient will identify two positive emotions or experiences to reflect on in order to balance out negative emotions:  2. Patient will label two or more emotions that they find the most difficult to experience:  3. Patient will be able to demonstrate positive conflict resolution skills through discussion or role plays:   Summary of Patient Progress:       Therapeutic Modalities:   Cognitive Behavioral Therapy Feelings Identification Dialectical Behavioral Therapy  Greg Killian Ress, LCSW 

## 2017-07-10 NOTE — [facility] Suicide Risk Assessment (Signed)
[facility] INPATIENT:  Family/Significant Other Suicide Prevention Education  Suicide Prevention Education:  Education Completed; Bryan [name], mother, has been identified by the patient as the family member/significant other with whom the patient will be residing, and identified as the person(s) who will aid the patient in the event of a mental health crisis (suicidal ideations/suicide attempt).  With written consent from the patient, the family member/significant other has been provided the following suicide prevention education, prior to the and/or following the discharge of the patient.  The suicide prevention education provided includes the following:  Suicide risk factors  Suicide prevention and interventions  National Suicide Hotline telephone number  [facility] assessment telephone number  [facility] Emergency Assistance 911  [facility] and/or Residential Mobile Crisis Unit telephone number  Request made of family/significant other to:  Remove weapons (e.g., guns, rifles, knives), all items previously/currently identified as safety concern.  No guns, per mother.  Remove drugs/medications (over-the-counter, prescriptions, illicit drugs), all items previously/currently identified as a safety concern.  The family member/significant other verbalizes understanding of the suicide prevention education information provided.  The family member/significant other agrees to remove the items of safety concern listed above.  Pt has been much more aggressive over the past few months.  He is also very isolated.  Pt used to work and had a girlfriend--this was several years ago.  Mother was at [facility] for visit today--commented that pt jerking motions are new--she noticed this for the first time right before he came to [facility].  Pt has been resistant to taking an injection when that option has been presented.  He has never had an ACT team, but it has been discussed.    Bryan [name] 07/10/2017, 2:19 PM

## 2017-07-10 NOTE — Plan of Care (Signed)
D: Pt stayed in his room this evening, pt forwards little information.   A: Pt was offered support and encouragement. Pt was given scheduled medications. Pt was encourage to attend groups. Q 15 minute checks were done for safety.   R: safety maintained on unit.   Problem: Activity: Goal: Sleeping patterns will improve Outcome: Progressing   Problem: Safety: Goal: Periods of time without injury will increase Outcome: Progressing

## 2017-07-10 NOTE — Progress Notes (Signed)
Did not attend group 

## 2017-07-11 NOTE — Progress Notes (Signed)
Did not attend group 

## 2017-07-11 NOTE — [facility] Group Notes (Signed)
LCSW Group Therapy Note   07/11/2017 1:15pm   Type of Therapy and Topic:  Group Therapy:  Positive Affirmations   Participation Level:  Did Not Attend  Description of Group: This group addressed positive affirmation toward self and others. Patients went around the room and identified two positive things about themselves and two positive things about a peer in the room. Patients reflected on how it felt to share something positive with others, to identify positive things about themselves, and to hear positive things from others. Patients were encouraged to have a daily reflection of positive characteristics or circumstances.  Therapeutic Goals 1. Patient will verbalize two of their positive qualities 2. Patient will demonstrate empathy for others by stating two positive qualities about a peer in the group 3. Patient will verbalize their feelings when voicing positive self affirmations and when voicing positive affirmations of others 4. Patients will discuss the potential positive impact on their wellness/recovery of focusing on positive traits of self and others. Summary of Patient Progress:    Therapeutic Modalities Cognitive Behavioral Therapy Motivational Interviewing  Ida Rogue, [place] 07/11/2017 3:00 PM

## 2017-07-11 NOTE — Plan of Care (Signed)
D: Pt denies SI/HI/AV hallucinations. Pt observed in room in bed most of shift. Patient did not engages minimal in conversation with this Clinical research associate. A: Pt was offered support and encouragement. Pt was given scheduled medications. Pt was encourage to attend groups. Q 15 minute checks were done for safety.  R:Pt attends groups and interacts well with peers and staff. Pt is taking medication. Pt has no complaints.Pt receptive to treatment and safety maintained on unit.    Problem: Safety: Goal: Ability to remain free from injury will improve Outcome: Progressing Note:  Patient denies SI and remains safe on unit
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
## 2017-07-11 NOTE — Tx Team (Signed)
Interdisciplinary Treatment and Diagnostic Plan Update  07/11/2017 Time of Session: 0929 Bryan [name] MRN: [id number]  Principal Diagnosis: Undifferentiated schizophrenia [facility])  Secondary Diagnoses: Principal Problem:   Undifferentiated schizophrenia ([place]) Active Problems:   Delusional disorder ([place])   Current Medications:  Current Facility-Administered Medications  Medication Dose Route Frequency Provider Last Rate Last Dose  . acetaminophen (TYLENOL) tablet 650 mg  650 mg Oral Q6H PRN Ethelene Hal, NP      . alum & mag hydroxide-simeth (MAALOX/MYLANTA) 200-200-20 MG/5ML suspension 30 mL  30 mL Oral Q4H PRN Ethelene Hal, NP      . feeding supplement (ENSURE ENLIVE) (ENSURE ENLIVE) liquid 237 mL  237 mL Oral Q24H Pennelope Bracken, MD   237 mL at 07/09/17 0819  . gabapentin (NEURONTIN) capsule 100 mg  100 mg Oral BID Cobos, Myer Peer, MD   100 mg at 07/11/17 0744  . ibuprofen (ADVIL,MOTRIN) tablet 400 mg  400 mg Oral Q6H PRN Withrow, Elyse Jarvis, FNP      . LORazepam (ATIVAN) tablet 1 mg  1 mg Oral Q6H PRN Cobos, Myer Peer, MD   1 mg at 07/08/17 2117  . magnesium hydroxide (MILK OF MAGNESIA) suspension 30 mL  30 mL Oral Daily PRN Ethelene Hal, NP      . paliperidone [facility] SUSTENNA) injection 156 mg  156 mg Intramuscular Q30 days Pennelope Bracken, MD   156 mg at 07/11/17 1312  . paliperidone (INVEGA) 24 hr tablet 6 mg  6 mg Oral Daily Ethelene Hal, NP   6 mg at 07/11/17 0744  . traZODone (DESYREL) tablet 50 mg  50 mg Oral QHS PRN Ethelene Hal, NP   50 mg at 07/08/17 2117   PTA Medications: Medications Prior to Admission  Medication Sig Dispense Refill Last Dose  . benztropine (COGENTIN) 1 MG tablet Take 1 mg by mouth 2 (two) times daily.   unknown  . diphenhydrAMINE (BENADRYL) 25 MG tablet Take 1 tablet (25 mg total) by mouth every 6 (six) hours. (Patient not taking: Reported on 07/05/2017) 20 tablet 0 Not Taking at Unknown  time  . fluPHENAZine (PROLIXIN) 10 MG tablet Take 10-20 mg by mouth daily. Take 71m by mouth daily for 7 seven days, then take 236mby mouth thereafter daily.   unknown  . OLANZapine (ZYPREXA) 5 MG tablet Take 5 mg by mouth at bedtime.   unknown    Patient Stressors: Medication change or noncompliance Substance abuse  Patient Strengths: Average or above average intelligence Supportive family/friends  Treatment Modalities: Medication Management, Group therapy, Case management,  1 to 1 session with clinician, Psychoeducation, Recreational therapy.   Physician Treatment Plan for Primary Diagnosis: Undifferentiated schizophrenia ([place](s): Improvement in symptoms so as ready for discharge Improvement in symptoms so as ready for discharge   Short Term Goals: Ability to demonstrate self-control will improve Ability to maintain clinical measurements within normal limits will improve Compliance with prescribed medications will improve  Medication Management: Evaluate patient's response, side effects, and tolerance of medication regimen.  Therapeutic Interventions: 1 to 1 sessions, Unit Group sessions and Medication administration.  Evaluation of Outcomes: Progressing   5/16: He is in agreement to continue his current treatment regimen without changes, and he agrees to receive booster injection of [place]. SW team has been working with pt and his family in regards to establishing a discharge/follow up plan.    Physician Treatment Plan for Secondary Diagnosis: Principal Problem:   Undifferentiated  schizophrenia ([place]) Active Problems:   Delusional disorder ([place])  Long Term Goal(s): Improvement in symptoms so as ready for discharge Improvement in symptoms so as ready for discharge   Short Term Goals: Ability to demonstrate self-control will improve Ability to maintain clinical measurements within normal limits will improve Compliance with prescribed  medications will improve     Medication Management: Evaluate patient's response, side effects, and tolerance of medication regimen.  Therapeutic Interventions: 1 to 1 sessions, Unit Group sessions and Medication administration.  Evaluation of Outcomes: Progressing   RN Treatment Plan for Primary Diagnosis: Undifferentiated schizophrenia ([place]) Long Term Goal(s): Knowledge of disease and therapeutic regimen to maintain health will improve  Short Term Goals: Ability to identify and develop effective coping behaviors will improve and Compliance with prescribed medications will improve  Medication Management: RN will administer medications as ordered by provider, will assess and evaluate patient's response and provide education to patient for prescribed medication. RN will report any adverse and/or side effects to prescribing provider.  Therapeutic Interventions: 1 on 1 counseling sessions, Psychoeducation, Medication administration, Evaluate responses to treatment, Monitor vital signs and CBGs as ordered, Perform/monitor CIWA, COWS, AIMS and Fall Risk screenings as ordered, Perform wound care treatments as ordered.  Evaluation of Outcomes: Progressing   LCSW Treatment Plan for Primary Diagnosis: Undifferentiated schizophrenia ([place]) Long Term Goal(s): Safe transition to appropriate next level of care at discharge, Engage patient in therapeutic group addressing interpersonal concerns.  Short Term Goals: Engage patient in aftercare planning with referrals and resources, Increase social support and Increase skills for wellness and recovery  Therapeutic Interventions: Assess for all discharge needs, 1 to 1 time with Social worker, Explore available resources and support systems, Assess for adequacy in community support network, Educate family and significant other(s) on suicide prevention, Complete Psychosocial Assessment, Interpersonal group therapy.  Evaluation of Outcomes: Met  Return home,  follow up outpt   Progress in Treatment: Attending groups: No. Participating in groups: No. Taking medication as prescribed: Yes. Toleration medication: Yes. Family/Significant other contact made: No, will contact:  when given permission Patient understands diagnosis: Yes. Discussing patient identified problems/goals with staff: Yes. Medical problems stabilized or resolved: Yes. Denies suicidal/homicidal ideation: Yes. Issues/concerns per patient self-inventory: No. Other: none  New problem(s) identified: No, Describe:  none  New Short Term/Long Term Goal(s):Pt goal: "do better than what I have been doing: chemical imbalance, anxiety"  Discharge Plan or Barriers:   Reason for Continuation of Hospitalization: Medication stabilization  Estimated Length of Stay: 5/21  Attendees: Patient:  07/08/2017   Physician: Dr Nancy Fetter 07/08/2017   Nursing: Elesa Massed, RN 07/08/2017   RN Care Manager: 07/08/2017   Social Worker:Rod Monument LCSW 07/08/2017   Recreational Therapist:  07/08/2017   Other:  07/08/2017   Other:  07/08/2017   Other: 07/08/2017        Scribe for Treatment Team: Trish Mage, LCSW 07/11/2017 3:00 PM

## 2017-07-11 NOTE — Progress Notes (Signed)
Recreation Therapy Notes  Date: 5.16.19 Time: 1000 Location: 500 Hall Dayroom  Group Topic: Self-Expression  Goal Area(s) Addresses:  Patient will successfully identify positive attributes about themselves.  Patient will successfully identify benefit of improved self-expression.   Intervention: Markers, colored pencils, construction paper  Activity: Personalized License Plate.  Patients were to design a license plate that described them, what they like and important dates.  Education:  Self-Esteem, Building control surveyor.   Education Outcome: Acknowledges education/In group clarification offered/Needs additional education  Clinical Observations/Feedback: Pt did not attend group.    Caroll Rancher, LRT/CTRS         Lillia Abed, Brylin Stopper A 07/11/2017 1:30 PM

## 2017-07-12 NOTE — [facility] Group Notes (Signed)
[facility] LCSW Group Therapy  07/12/2017  1:15 PM  Type of Therapy:  Group therapy  Participation Level:  Did not attend  Participation Quality:    Affect:    Cognitive:    Insight:    Engagement in Therapy:   Modes of Intervention:  Discussion, Socialization  Summary of Progress/Problems:  Chaplain was here to lead a group on themes of hope and courage.  Greg Kanesha Cadle, LCSW  

## 2017-07-12 NOTE — Progress Notes (Signed)
Did not attended group 

## 2017-07-12 NOTE — Progress Notes (Signed)
Patient has been isolative to room for the majority of the shift.  Patient denies SI, HI and AVH and reports a reduction in psychotic symptoms.  Patient has had no issues of behavioral dsycontrol.  Assess patient for safety, offer medications as prescribed, engage in 1:1 staff talks. Patient able to contract for safety.  Continue to monitor as planned.   

## 2017-07-12 NOTE — Progress Notes (Signed)
[facility] MD Progress Note  07/12/2017 3:07 PM Bryan [name]  MRN:  [id number]  Subjective: Cane reports, "I have been here. I came in a couple of days ago. I'm doing better, but, I was not doing well a couple of days ago. I feel more rested here. I'm thinking clearly. The medicines are good. I have not had any side effects yet. I don't go to groups. I find myself a little different from others. I feel kind of exhausted. I eat my food in here, I don't go to cafeteria".  Bryan [name] is a 42 y/o M with history of schizophrenia admitted on IVC placed by family due to not taking care of ADL's, agitation episodes of destroying his home/punching holes in walls, and poor medication adherence. Pt was disorganized, flat, and thought blocking on initial presentation. Pt has also been reporting new-onset of myoclonic jerks which have continued during his hospitalization, but they appear to occur mostly when interacting with treatment team members suggesting they may be induced by anxiety rather than a neurologic cause. He was started on [place] which he tolerated without difficulty, and he was transitioned to long-acting injectable form of [place] of which he received first dose on 5/13, and we are anticipating booster injection to be given on today, 5/16. Pt has been reporting improvement of his presenting symptoms.  Today 07-12-17, Bryan [name] is seen, chart reviewed. The chart findings discussed with the treatment team. He is alert, lying in his bed in a supine position. He is making some eye contact. He speaks clearly however, seem to have thought blocking episodes. He says he feels more rested here. He says he does not attend the group sessions or go to the cafeteria to eat with the rest of the patient because he finds himself a little different from others. Although he says he feels rested, he also adds that he feels exhausted the same time.  He denies SI/HI/AH/VH. He is tolerating his medications without  difficulty or side effects. He is in agreement to continue his current treatment regimen without changes. He received his booster injection of [place] yesterday. SW team has been working with pt and his family in regards to establishing a discharge/follow up plan. Pt is in agreement with the above plan, and he had no further questions, comments, or concerns. He presents with a gross foul odor. He does not live his room.  Principal Problem: Undifferentiated schizophrenia (HCC) Diagnosis:   Patient Active Problem List   Diagnosis Date Noted  . Undifferentiated schizophrenia (HCC) [F20.3]     Priority: High  . 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 Illiterate [Z55.0] 10/30/2011  . Schizoaffective disorder, depressive type (HCC) [F25.1] 10/27/2011  . Schizoaffective disorder (HCC) [F25.9] 08/10/2011  . Observed seizure-like activity (HCC) [R56.9] 08/09/2011  . PAIN IN JOINT, MULTIPLE SITES [M25.50] 02/16/2010  . FATIGUE [R53.81, R53.83] 02/16/2010  . Shortness of breath [R06.02] 02/16/2010   Total Time spent with patient: 15 minutes  Past Psychiatric History: See H&P  Past Medical History:  Past Medical History:  Diagnosis Date  . Hypertension   . Psychiatric problem    Seen at [facility] for unknown reason. Takes seroquel. History of hearing voices.  Not commandivng voices.   . Schizophrenia (HCC)   . Tremors of nervous system     Past Surgical History:  Procedure Laterality Date  . none     Family History: History reviewed. No pertinent family history.  Family Psychiatric  History: See H&P  Social History:  Social History   Substance and Sexual Activity  Alcohol Use Yes   Comment: says drinks a 40 at least once a week. denies drink in 2-3 weeks.      Social History   Substance and Sexual Activity  Drug Use Yes  . Types: Cocaine, Marijuana   Comment: marijuana somewhat regularly    Social History   Socioeconomic History  . Marital status: Married    Spouse name:  Not on file  . Number of children: Not on file  . Years of education: Not on file  . Highest education level: Not on file  Occupational History  . Not on file  Social Needs  . Financial resource strain: Not on file  . Food insecurity:    Worry: Not on file    Inability: Not on file  . Transportation needs:    Medical: Not on file    Non-medical: Not on file  Tobacco Use  . Smoking status: Current Every Day Smoker    Packs/day: 0.50    Types: Cigarettes  . Smokeless tobacco: Never Used  Substance and Sexual Activity  . Alcohol use: Yes    Comment: says drinks a 40 at least once a week. denies drink in 2-3 weeks.   . Drug use: Yes    Types: Cocaine, Marijuana    Comment: marijuana somewhat regularly  . Sexual activity: Never  Lifestyle  . Physical activity:    Days per week: Not on file    Minutes per session: Not on file  . Stress: Not on file  Relationships  . Social connections:    Talks on phone: Not on file    Gets together: Not on file    Attends religious service: Not on file    Active member of club or organization: Not on file    Attends meetings of clubs or organizations: Not on file    Relationship status: Not on file  Other Topics Concern  . Not on file  Social History Narrative   LIves in [place] in an apartment with his brother. Unclear why on medicaid.   Additional Social History:   Sleep: Good  Appetite:  Good  Current Medications: Current Facility-Administered Medications  Medication Dose Route Frequency Provider Last Rate Last Dose  . acetaminophen (TYLENOL) tablet 650 mg  650 mg Oral Q6H PRN Laveda Abbe, NP      . alum & mag hydroxide-simeth (MAALOX/MYLANTA) 200-200-20 MG/5ML suspension 30 mL  30 mL Oral Q4H PRN Laveda Abbe, NP      . feeding supplement (ENSURE ENLIVE) (ENSURE ENLIVE) liquid 237 mL  237 mL Oral Q24H Micheal Likens, MD   237 mL at 07/12/17 0920  . gabapentin (NEURONTIN) capsule 100 mg  100 mg  Oral BID Cobos, Rockey Situ, MD   100 mg at 07/12/17 0758  . ibuprofen (ADVIL,MOTRIN) tablet 400 mg  400 mg Oral Q6H PRN Withrow, Everardo All, FNP      . LORazepam (ATIVAN) tablet 1 mg  1 mg Oral Q6H PRN Cobos, Rockey Situ, MD   1 mg at 07/08/17 2117  . magnesium hydroxide (MILK OF MAGNESIA) suspension 30 mL  30 mL Oral Daily PRN Laveda Abbe, NP      . paliperidone [facility] SUSTENNA) injection 156 mg  156 mg Intramuscular Q30 days Micheal Likens, MD   156 mg at 07/11/17 1312  . paliperidone (INVEGA) 24 hr tablet 6 mg  6 mg Oral Daily Laveda Abbe, NP  6 mg at 07/12/17 0758  . traZODone (DESYREL) tablet 50 mg  50 mg Oral QHS PRN Laveda Abbe, NP   50 mg at 07/08/17 2117   Lab Results: No results found for this or any previous visit (from the past 48 hour(s)).  Blood Alcohol level:  Lab Results  Component Value Date   ETH <10 07/05/2017   ETH <10 02/16/2017   Metabolic Disorder Labs: No results found for: HGBA1C, MPG No results found for: PROLACTIN No results found for: CHOL, TRIG, HDL, CHOLHDL, VLDL, LDLCALC  Physical Findings: AIMS: Facial and Oral Movements Muscles of Facial Expression: None, normal Lips and Perioral Area: None, normal Jaw: None, normal Tongue: None, normal,Extremity Movements Upper (arms, wrists, hands, fingers): None, normal Lower (legs, knees, ankles, toes): None, normal, Trunk Movements Neck, shoulders, hips: None, normal, Overall Severity Severity of abnormal movements (highest score from questions above): None, normal Incapacitation due to abnormal movements: None, normal Patient's awareness of abnormal movements (rate only patient's report): No Awareness, Dental Status Current problems with teeth and/or dentures?: No Does patient usually wear dentures?: No  CIWA:  CIWA-Ar Total: 1 COWS:  COWS Total Score: 2  Musculoskeletal: Strength & Muscle Tone: within normal limits Gait & Station: normal Patient leans:  N/A  Psychiatric Specialty Exam: Physical Exam  Nursing note and vitals reviewed.   Review of Systems  Constitutional: Negative for chills and fever.  Respiratory: Negative for cough and shortness of breath.   Cardiovascular: Negative for chest pain.  Gastrointestinal: Negative for abdominal pain, heartburn, nausea and vomiting.  Psychiatric/Behavioral: Negative for depression, hallucinations and suicidal ideas. The patient is not nervous/anxious and does not have insomnia.     Blood pressure 112/80, pulse (!) 115, temperature 98.2 F (36.8 C), temperature source Oral, resp. rate 16, height 5' 9.5" (1.765 m), weight 70.3 kg (155 lb).Body mass index is 22.56 kg/m.  General Appearance: Casual and Disheveled  Eye Contact:  Good  Speech:  Clear and Coherent and Normal Rate  Volume:  Normal  Mood:  Euthymic  Affect:  Congruent and Flat  Thought Process:  Coherent and Goal Directed  Orientation:  Full (Time, Place, and Person)  Thought Content:  Logical  Suicidal Thoughts:  No  Homicidal Thoughts:  No  Memory:  Immediate;   Fair Recent;   Fair Remote;   Fair  Judgement:  Poor  Insight:  Lacking  Psychomotor Activity:  Normal  Concentration:  Concentration: Fair  Recall:  [employer] of Knowledge:  Fair  Language:  Fair  Akathisia:  No  Handed:    AIMS (if indicated):     Assets:  Desire for Improvement Housing Physical Health Resilience Social Support  ADL's:  Intact  Cognition:  WNL  Sleep:  Number of Hours: 6.75   Treatment Plan Summary: Daily contact with patient to assess and evaluate symptoms and progress in treatment and Medication management   -Continue inpatient hospitalization.  -Will continue today 07/12/2017 plan as below except where it is noted.  -Schizophrenia -Continue Invega  po qDay -Continue Invega [place]  IM q30days (starting on today 07/11/17; Hinda Glatter Sustenna  IM administered on  07/08/17)  -Anxiety -Continue gabapentin  po BID -Continue ativan  po q6h prn anxiety  - Insomnia -Continue trazodone  po qhs prn insomnia  -Encourage participation in groups and therapeutic milieu  -Disposition planning will be ongoing  Armandina Stammer, NP, pmhnp, fnp-bc. 07/12/2017, 3:07 PMPatient ID: Bryan [name], male   DOB: [birth year], 42 y.o.   MRN:  [id number]  

## 2017-07-12 NOTE — Progress Notes (Signed)
CSW spoke to [facility] and they do have openings for ACT.  She will speak with team lead and thinks they could come to [facility] by Monday for intake.  CSW received message from [facility].  They are not accepting referrals for [facility] or [facility] currently. Garner Nash, MSW, LCSW Clinical Social Worker 07/12/2017 1:28 PM

## 2017-07-12 NOTE — Plan of Care (Signed)
  D: Pt denies SI/HI/ Pt is pleasant and cooperative. Pt isolates to room, pt presents depressed . Pt endorces less AVH at this time  A: Pt was offered support and encouragement. Pt was encourage to attend groups. Q 15 minute checks were done for safety.   R: safety maintained on unit.   Problem: Activity: Goal: Sleeping patterns will improve Outcome: Progressing   Problem: Education: Goal: Will be free of psychotic symptoms Outcome: Progressing   Problem: Coping: Goal: Will verbalize feelings Outcome: Progressing

## 2017-07-12 NOTE — Progress Notes (Signed)
Recreation Therapy Notes  Date: 5.17.19 Time: 1000 Location: 500 Hall Dayroom   Group Topic: Communication, Team Building, Problem Solving  Goal Area(s) Addresses:  Patient will effectively work with peer towards shared goal.  Patient will identify skill used to make activity successful.  Patient will identify how skills used during activity can be used to reach post d/c goals.   Intervention: STEM Activity   Activity: [employer]. Patients were provided the following materials: 5 drinking straws, 5 rubber bands, 5 paper clips, 2 index cards, 2 drinking cups, and 2 toilet paper rolls. Using the provided materials patients were asked to build a launching mechanisms to launch a ping pong ball approximately 12 feet. Patients were divided into teams of 3-5.   Education: Pharmacist, community, Building control surveyor.   Education Outcome: Acknowledges education/In group clarification offered/Needs additional education.   Clinical Observations/Feedback:  Pt did not attend group.    Caroll Rancher, LRT/CTRS         Lillia Abed, Tehya Leath A 07/12/2017 11:18 AM

## 2017-07-13 DIAGNOSIS — Z79899 Other long term (current) drug therapy: Secondary | ICD-10-CM

## 2017-07-13 NOTE — [facility] Group Notes (Signed)
[facility] Group Notes:  (Nursing/MHT/Case Management/Adjunct)  Date:  07/13/2017  Time:  1:02 PM  Type of Therapy:  Psychoeducational Skills  Participation Level:  Did Not Attend  Participation Quality:  Did not attend  Affect:  Did not attend  Cognitive:  Did not attend  Insight:  None  Engagement in Group:  Did not attend  Modes of Intervention:  Did not attend  Summary of Progress/Problems: Pt did not attend Psychoeducational group with topic anger management.  Jacquelyne Balint [name] 07/13/2017, 1:02 PM

## 2017-07-13 NOTE — Progress Notes (Signed)
Did not attend group 

## 2017-07-13 NOTE — Progress Notes (Addendum)
[facility] MD Progress Note  07/13/2017 5:40 PM Bryan [name]  MRN:  [id number] Subjective:    Evaluation: Bryan [name] seen resting in bed. Patient presents with flat guarded affect appears to be responding to internal stimuli.  Patient appears to be thought blocking and slow with responses.  Patient appears to be guarded and paranoid during this assessment.  Denies suicidal or homicidal ideations during this assessment.  Per chart patient continues to isolate to room.  Nursing staff reports patient is taking medication and tolerating well.  Patient continues to need encouragement for ADLs.  Reports feeling "okay" during this assessment. Support encouragement and reassurance was provided.  History:Bryan [name] is a 42 y/o M with history of schizophrenia admitted on IVC placed by family due to not taking care of ADL's, agitation episodes of destroying his home/punching holes in walls, and poor medication adherence. Pt was disorganized, flat, and thought blocking on initial presentation. Pt has also been reporting new-onset of myoclonic jerks which have continued during his hospitalization, but they appear to occur mostly when interacting with treatment team members suggesting they may be induced by anxiety rather than a neurologic cause. He was started on [place] which he tolerated without difficulty, and he was transitioned to long-acting injectable form of [place] of which he received first dose on 5/13, and we are anticipating booster injection to be given on today, 5/16. Pt has been reporting improvement of his presenting symptoms.   Principal Problem: Undifferentiated schizophrenia (HCC) Diagnosis:   Patient Active Problem List   Diagnosis Date Noted  . Undifferentiated schizophrenia (HCC) [F20.3]   . 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 Illiterate [Z55.0] 10/30/2011  . Schizoaffective disorder, depressive type (HCC) [F25.1] 10/27/2011  . Schizoaffective disorder (HCC) [F25.9] 08/10/2011  .  Observed seizure-like activity (HCC) [R56.9] 08/09/2011  . PAIN IN JOINT, MULTIPLE SITES [M25.50] 02/16/2010  . FATIGUE [R53.81, R53.83] 02/16/2010  . Shortness of breath [R06.02] 02/16/2010   Total Time spent with patient: 30 minutes  Past Psychiatric History: see H&P  Past Medical History:  Past Medical History:  Diagnosis Date  . Hypertension   . Psychiatric problem    Seen at [facility] for unknown reason. Takes seroquel. History of hearing voices.  Not commandivng voices.   . Schizophrenia (HCC)   . Tremors of nervous system     Past Surgical History:  Procedure Laterality Date  . none     Family History: History reviewed. No pertinent family history. Family Psychiatric  History: see H&P Social History:  Social History   Substance and Sexual Activity  Alcohol Use Yes   Comment: says drinks a 40 at least once a week. denies drink in 2-3 weeks.      Social History   Substance and Sexual Activity  Drug Use Yes  . Types: Cocaine, Marijuana   Comment: marijuana somewhat regularly    Social History   Socioeconomic History  . Marital status: Married    Spouse name: Not on file  . Number of children: Not on file  . Years of education: Not on file  . Highest education level: Not on file  Occupational History  . Not on file  Social Needs  . Financial resource strain: Not on file  . Food insecurity:    Worry: Not on file    Inability: Not on file  . Transportation needs:    Medical: Not on file    Non-medical: Not on file  Tobacco Use  . Smoking status: Current Every Day Smoker  Packs/day: 0.50    Types: Cigarettes  . Smokeless tobacco: Never Used  Substance and Sexual Activity  . Alcohol use: Yes    Comment: says drinks a 40 at least once a week. denies drink in 2-3 weeks.   . Drug use: Yes    Types: Cocaine, Marijuana    Comment: marijuana somewhat regularly  . Sexual activity: Never  Lifestyle  . Physical activity:    Days per week: Not on file     Minutes per session: Not on file  . Stress: Not on file  Relationships  . Social connections:    Talks on phone: Not on file    Gets together: Not on file    Attends religious service: Not on file    Active member of club or organization: Not on file    Attends meetings of clubs or organizations: Not on file    Relationship status: Not on file  Other Topics Concern  . Not on file  Social History Narrative   LIves in [place] in an apartment with his brother. Unclear why on medicaid.   Additional Social History:                         Sleep: Good  Appetite:  Good  Current Medications: Current Facility-Administered Medications  Medication Dose Route Frequency Provider Last Rate Last Dose  . acetaminophen (TYLENOL) tablet 650 mg  650 mg Oral Q6H PRN Laveda Abbe, NP      . alum & mag hydroxide-simeth (MAALOX/MYLANTA) 200-200-20 MG/5ML suspension 30 mL  30 mL Oral Q4H PRN Laveda Abbe, NP      . feeding supplement (ENSURE ENLIVE) (ENSURE ENLIVE) liquid 237 mL  237 mL Oral Q24H Micheal Likens, MD   237 mL at 07/13/17 0826  . gabapentin (NEURONTIN) capsule 100 mg  100 mg Oral BID Cobos, Rockey Situ, MD   100 mg at 07/13/17 0826  . ibuprofen (ADVIL,MOTRIN) tablet 400 mg  400 mg Oral Q6H PRN Withrow, Everardo All, FNP      . LORazepam (ATIVAN) tablet 1 mg  1 mg Oral Q6H PRN Cobos, Rockey Situ, MD   1 mg at 07/08/17 2117  . magnesium hydroxide (MILK OF MAGNESIA) suspension 30 mL  30 mL Oral Daily PRN Laveda Abbe, NP      . paliperidone [facility] SUSTENNA) injection 156 mg  156 mg Intramuscular Q30 days Micheal Likens, MD   156 mg at 07/11/17 1312  . paliperidone (INVEGA) 24 hr tablet 6 mg  6 mg Oral Daily Laveda Abbe, NP   6 mg at 07/13/17 1610  . traZODone (DESYREL) tablet 50 mg  50 mg Oral QHS PRN Laveda Abbe, NP   50 mg at 07/08/17 2117    Lab Results: No results found for this or any previous visit (from the past  48 hour(s)).  Blood Alcohol level:  Lab Results  Component Value Date   ETH <10 07/05/2017   ETH <10 02/16/2017    Metabolic Disorder Labs: No results found for: HGBA1C, MPG No results found for: PROLACTIN No results found for: CHOL, TRIG, HDL, CHOLHDL, VLDL, LDLCALC  Physical Findings: AIMS: Facial and Oral Movements Muscles of Facial Expression: None, normal Lips and Perioral Area: None, normal Jaw: None, normal Tongue: None, normal,Extremity Movements Upper (arms, wrists, hands, fingers): None, normal Lower (legs, knees, ankles, toes): None, normal, Trunk Movements Neck, shoulders, hips: None, normal, Overall Severity Severity of abnormal  movements (highest score from questions above): None, normal Incapacitation due to abnormal movements: None, normal Patient's awareness of abnormal movements (rate only patient's report): No Awareness, Dental Status Current problems with teeth and/or dentures?: No Does patient usually wear dentures?: No  CIWA:  CIWA-Ar Total: 1 COWS:  COWS Total Score: 2  Musculoskeletal: Strength & Muscle Tone: within normal limits Gait & Station: normal Patient leans: N/A  Psychiatric Specialty Exam: Physical Exam  Nursing note and vitals reviewed. Neurological: He is alert.    Review of Systems  Psychiatric/Behavioral: Negative for depression, hallucinations and suicidal ideas. The patient is not nervous/anxious and does not have insomnia.   All other systems reviewed and are negative.   Blood pressure 97/71, pulse (!) 118, temperature 98.7 F (37.1 C), temperature source Oral, resp. rate 20, height 5' 9.5" (1.765 m), weight 70.3 kg (155 lb).Body mass index is 22.56 kg/m.  General Appearance: Casual and Disheveled  Eye Contact:  Good  Speech:  Clear and Coherent and Normal Rate  Volume:  Normal  Mood:  Euthymic  Affect:  Congruent and Flat  Thought Process:  Coherent and Goal Directed  Orientation:  Full (Time, Place, and Person)   Thought Content:  Logical  Suicidal Thoughts:  No  Homicidal Thoughts:  No  Memory:  Immediate;   Fair Recent;   Fair Remote;   Fair  Judgement:  Poor  Insight:  Lacking  Psychomotor Activity:  Normal  Concentration:  Concentration: Fair  Recall:  [employer] of Knowledge:  Fair  Language:  Fair  Akathisia:  No  Handed:    AIMS (if indicated):     Assets:  Desire for Improvement Housing Physical Health Resilience Social Support  ADL's:  Intact  Cognition:  WNL  Sleep:  Number of Hours: 6.75   Treatment Plan Summary: Daily contact with patient to assess and evaluate symptoms and progress in treatment and Medication management   Continue with current treatment plan on 07/13/2017 except were noted as listed below  -Schizophrenia -Continue Invega  po qDay -Continue Invega Sustenna  IM q30days (starting on today 07/11/17; Hinda Glatter Sustenna  IM administered on 07/08/17)  -Anxiety -Continue gabapentin  po BID -Continue ativan  po q6h prn anxiety  - Insomnia -Continue trazodone  po qhs prn insomnia  -Encourage participation in groups and therapeutic milieu -Disposition planning will be ongoing  Oneta Rack, NP 07/13/2017, 5:40 PM

## 2017-07-13 NOTE — Progress Notes (Signed)
Patient ID: Bryan [name], male   DOB: 04-02-1975, 42 y.o.   MRN: [id number]    D: Pt has been very isolative and depressed on the unit today. Pt has also been very paranoid and suspicious of others. Pt did not attend any groups nor did he engage in any treatment. Pt did go to lunch but did not go outside for recreation. Pt refused to fill out his patient self inventory sheet, verbally he did report some things to staff.  Pt reported that he was negative for depression, anxiety, and hopelessness. Pt reported that he had no goal for today.Pt reported being negative SI/HI, no AH/VH noted. Pt did appear to be responding to internal stimuli. A: 15 min checks continued for patient safety. R: Pt safety maintained.

## 2017-07-13 NOTE — [facility] Group Notes (Signed)
  [facility] LCSW Group Therapy Note  Date/Time:  07/13/2017 11:30AM-12:00PM  Type of Therapy and Topic:  Group Therapy:  Feelings About Hospitalization  Participation Level:  Minimal   Description of Group This process group involved patients discussing their feelings related to being hospitalized, as well as the benefits they see to being in the hospital.  These feelings and benefits were itemized.  The group then brainstormed specific ways in which they could seek those same benefits when they discharge and return home.  Therapeutic Goals 1. Patient will identify and describe positive and negative feelings related to hospitalization 2. Patient will verbalize benefits of hospitalization to themselves personally 3. Patients will brainstorm together ways they can obtain similar benefits in the outpatient setting, identify barriers to wellness and possible solutions  Summary of Patient Progress:  The patient expressed his primary feelings about being hospitalized are good because it has "let me know where I'm at."  He did not speak much in group and finally left.  His affect was flat throughout group.  Therapeutic Modalities Cognitive Behavioral Therapy Motivational Interviewing    Ambrose Mantle, LCSW 07/13/2017, 12:17 PM

## 2017-07-13 NOTE — [facility] Group Notes (Signed)
[facility] Group Notes:  (Nursing/MHT/Case Management/Adjunct)  Date:  07/13/2017  Time:  1:01 PM  Type of Therapy:  Orientation/Goals group  Participation Level:  Did Not Attend  Participation Quality:  Did Not Attend  Affect:  Did Not Attend  Cognitive:  Did Not Attend  Insight:  None  Engagement in Group:  Did Not Attend  Modes of Intervention:  Did Not Attend  Summary of Progress/Problems: Pt did not attend patient self inventory group/orientation group.  Bryan [name] [name] 07/13/2017, 1:01 PM

## 2017-07-14 MED ORDER — GABAPENTIN 100 MG PO CAPS
200.0000 mg | ORAL_CAPSULE | Freq: Two times a day (BID) | ORAL | Status: DC
  Administered 2017-07-14 – 2017-07-17 (×6): 200 mg via ORAL
  Filled 2017-07-14 (×10): qty 2

## 2017-07-14 NOTE — Progress Notes (Signed)
Pt presents with a flat affect and depressed mood. Pt rates depression 9/10 on self inventory sheet. Pt denies SI/HI. Pt denies AVH. Pt appears to have thought blocking and delayed responses during assessment. Pt guarded during shift assessment and forwarded little information. Pt is isolative to his room but will come out of the room to attend meals and to ask staff questions. Pt appeared disheveled on approach but later this morning he performed his  ADL's and shaved his face.  Medications reviewed with pt. Verbal support provided. Pt encouraged to attend groups. 15 minute checks performed for safety.  Pt compliant with tx plan. No side effects to meds verbalized by pt.

## 2017-07-14 NOTE — Plan of Care (Signed)
D: Pt denies SI/HI/AVH. Pt is pleasant and cooperative. Pt was seen on the unit minimally, pt stated he went to a group today .   A: Pt was offered support and encouragement. Pt was given scheduled medications. Pt was encourage to attend groups. Q 15 minute checks were done for safety.   R:Pt attends groups and interacts well with peers and staff. Pt is taking medication. Pt has no complaints.Pt receptive to treatment and safety maintained on unit.   Problem: Activity: Goal: Interest or engagement in activities will improve Outcome: Progressing   Problem: Activity: Goal: Sleeping patterns will improve Outcome: Progressing   Problem: Safety: Goal: Periods of time without injury will increase Outcome: Progressing

## 2017-07-14 NOTE — Progress Notes (Signed)
[facility] MD Progress Note  07/14/2017 10:08 AM [name]  MRN:  [id number] Subjective:     Evaluation:  Bryan [name] seen resting in bed. continues to present disheveled , guarded and flat affect.  Patient reports " I am good."  Patient appears to be thought blocking and slow with responses. [name] continues to minimize thoughts, feelings and symptoms.  Patient continues to  appears to be guarded and paranoid during this assessment.  Denies suicidal or homicidal ideations during this assessment.  Per chart patient continues to isolate to room without group participation.  Nursing staff reports patient is taking medication and tolerating well.  Patient continues to need constant encouragement with hygiene. Support, encouragement and reassurance was provided.  History: Bryan [name] is a 42 y/o M with history of schizophrenia admitted on IVC placed by family due to not taking care of ADL's, agitation episodes of destroying his home/punching holes in walls, and poor medication adherence. Pt was disorganized, flat, and thought blocking on initial presentation. Pt has also been reporting new-onset of myoclonic jerks which have continued during his hospitalization, but they appear to occur mostly when interacting with treatment team members suggesting they may be induced by anxiety rather than a neurologic cause. He was started on [place] which he tolerated without difficulty, and he was transitioned to long-acting injectable form of [place] of which he received first dose on 5/13, and we are anticipating booster injection to be given on today, 5/16. Pt has been reporting improvement of his presenting symptoms.   Principal Problem: Undifferentiated schizophrenia (HCC) Diagnosis:   Patient Active Problem List   Diagnosis Date Noted  . Undifferentiated schizophrenia (HCC) [F20.3]   . 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 Illiterate [Z55.0] 10/30/2011  . Schizoaffective disorder, depressive type (HCC)  [F25.1] 10/27/2011  . Schizoaffective disorder (HCC) [F25.9] 08/10/2011  . Observed seizure-like activity (HCC) [R56.9] 08/09/2011  . PAIN IN JOINT, MULTIPLE SITES [M25.50] 02/16/2010  . FATIGUE [R53.81, R53.83] 02/16/2010  . Shortness of breath [R06.02] 02/16/2010   Total Time spent with patient: 30 minutes  Past Psychiatric History: see H&P  Past Medical History:  Past Medical History:  Diagnosis Date  . Hypertension   . Psychiatric problem    Seen at [facility] for unknown reason. Takes seroquel. History of hearing voices.  Not commandivng voices.   . Schizophrenia (HCC)   . Tremors of nervous system     Past Surgical History:  Procedure Laterality Date  . none     Family History: History reviewed. No pertinent family history. Family Psychiatric  History: see H&P Social History:  Social History   Substance and Sexual Activity  Alcohol Use Yes   Comment: says drinks a 40 at least once a week. denies drink in 2-3 weeks.      Social History   Substance and Sexual Activity  Drug Use Yes  . Types: Cocaine, Marijuana   Comment: marijuana somewhat regularly    Social History   Socioeconomic History  . Marital status: Married    Spouse name: Not on file  . Number of children: Not on file  . Years of education: Not on file  . Highest education level: Not on file  Occupational History  . Not on file  Social Needs  . Financial resource strain: Not on file  . Food insecurity:    Worry: Not on file    Inability: Not on file  . Transportation needs:    Medical: Not on file    Non-medical:  Not on file  Tobacco Use  . Smoking status: Current Every Day Smoker    Packs/day: 0.50    Types: Cigarettes  . Smokeless tobacco: Never Used  Substance and Sexual Activity  . Alcohol use: Yes    Comment: says drinks a 40 at least once a week. denies drink in 2-3 weeks.   . Drug use: Yes    Types: Cocaine, Marijuana    Comment: marijuana somewhat regularly  . Sexual activity:  Never  Lifestyle  . Physical activity:    Days per week: Not on file    Minutes per session: Not on file  . Stress: Not on file  Relationships  . Social connections:    Talks on phone: Not on file    Gets together: Not on file    Attends religious service: Not on file    Active member of club or organization: Not on file    Attends meetings of clubs or organizations: Not on file    Relationship status: Not on file  Other Topics Concern  . Not on file  Social History Narrative   LIves in [place] in an apartment with his brother. Unclear why on medicaid.   Additional Social History:                         Sleep: Good  Appetite:  Good  Current Medications: Current Facility-Administered Medications  Medication Dose Route Frequency Provider Last Rate Last Dose  . acetaminophen (TYLENOL) tablet 650 mg  650 mg Oral Q6H PRN Laveda Abbe, NP      . alum & mag hydroxide-simeth (MAALOX/MYLANTA) 200-200-20 MG/5ML suspension 30 mL  30 mL Oral Q4H PRN Laveda Abbe, NP      . feeding supplement (ENSURE ENLIVE) (ENSURE ENLIVE) liquid 237 mL  237 mL Oral Q24H Micheal Likens, MD   237 mL at 07/14/17 0825  . gabapentin (NEURONTIN) capsule 100 mg  100 mg Oral BID Cobos, Rockey Situ, MD   100 mg at 07/14/17 0824  . ibuprofen (ADVIL,MOTRIN) tablet 400 mg  400 mg Oral Q6H PRN Withrow, Everardo All, FNP      . LORazepam (ATIVAN) tablet 1 mg  1 mg Oral Q6H PRN Cobos, Rockey Situ, MD   1 mg at 07/08/17 2117  . magnesium hydroxide (MILK OF MAGNESIA) suspension 30 mL  30 mL Oral Daily PRN Laveda Abbe, NP      . paliperidone [facility] SUSTENNA) injection 156 mg  156 mg Intramuscular Q30 days Micheal Likens, MD   156 mg at 07/11/17 1312  . paliperidone (INVEGA) 24 hr tablet 6 mg  6 mg Oral Daily Laveda Abbe, NP   6 mg at 07/14/17 0824  . traZODone (DESYREL) tablet 50 mg  50 mg Oral QHS PRN Laveda Abbe, NP   50 mg at 07/13/17 2204     Lab Results: No results found for this or any previous visit (from the past 48 hour(s)).  Blood Alcohol level:  Lab Results  Component Value Date   ETH <10 07/05/2017   ETH <10 02/16/2017    Metabolic Disorder Labs: No results found for: HGBA1C, MPG No results found for: PROLACTIN No results found for: CHOL, TRIG, HDL, CHOLHDL, VLDL, LDLCALC  Physical Findings: AIMS: Facial and Oral Movements Muscles of Facial Expression: None, normal Lips and Perioral Area: None, normal Jaw: None, normal Tongue: None, normal,Extremity Movements Upper (arms, wrists, hands, fingers): None, normal Lower (legs,  knees, ankles, toes): None, normal, Trunk Movements Neck, shoulders, hips: None, normal, Overall Severity Severity of abnormal movements (highest score from questions above): None, normal Incapacitation due to abnormal movements: None, normal Patient's awareness of abnormal movements (rate only patient's report): No Awareness, Dental Status Current problems with teeth and/or dentures?: No Does patient usually wear dentures?: No  CIWA:  CIWA-Ar Total: 1 COWS:  COWS Total Score: 2  Musculoskeletal: Strength & Muscle Tone: within normal limits Gait & Station: normal Patient leans: N/A  Psychiatric Specialty Exam: Physical Exam  Nursing note and vitals reviewed. Constitutional: He appears well-developed.  Neurological: He is alert.  Psychiatric: He has a normal mood and affect. His behavior is normal.    Review of Systems  Psychiatric/Behavioral: Negative for depression, hallucinations and suicidal ideas. The patient is not nervous/anxious and does not have insomnia.   All other systems reviewed and are negative.   Blood pressure 105/80, pulse 95, temperature 98.4 F (36.9 C), temperature source Oral, resp. rate 20, height 5' 9.5" (1.765 m), weight 70.3 kg (155 lb).Body mass index is 22.56 kg/m.  General Appearance: Casual and Disheveled  Eye Contact:  Good  Speech:  Clear  and Coherent and Normal Rate  Volume:  Normal  Mood:  Euthymic  Affect:  Congruent and Flat  Thought Process:  Coherent and Goal Directed  Orientation:  Full (Time, Place, and Person)  Thought Content:  Logical  Suicidal Thoughts:  No  Homicidal Thoughts:  No  Memory:  Immediate;   Fair Recent;   Fair Remote;   Fair  Judgement:  Poor  Insight:  Lacking  Psychomotor Activity:  Normal  Concentration:  Concentration: Fair  Recall:  [employer] of Knowledge:  Fair  Language:  Fair  Akathisia:  No  Handed:    AIMS (if indicated):     Assets:  Desire for Improvement Housing Physical Health Resilience Social Support  ADL's:  Intact  Cognition:  WNL  Sleep:  Number of Hours: 6   Treatment Plan Summary: Daily contact with patient to assess and evaluate symptoms and progress in treatment and Medication management   Continue with current treatment plan on 07/14/2017 except were noted as listed below  -Schizophrenia -Continue Invega  po qDay -Continue Invega Sustenna  IM q30days (starting on today 07/11/17; Hinda Glatter Sustenna  IM administered on 07/08/17)  -Anxiety -Increased  gabapentin  po BID to 200 mg po BID  -Continue ativan  po q6h prn anxiety  - Insomnia -Continue trazodone  po qhs prn insomnia  -Encourage participation in groups and therapeutic milieu -Disposition planning will be ongoing  Oneta Rack, NP 07/14/2017, 10:08 AM

## 2017-07-14 NOTE — [facility] Group Notes (Signed)
[facility] Group Notes:  (Nursing)  Date:  07/14/2017  Time: 1:30 PM Type of Therapy:  Nurse Education  Participation Level:  Did Not Attend  Participation Quality:  Did not attend  Affect:  Did not attend  Cognitive:  Did not attend  Insight:  None  Engagement in Group:  None  Modes of Intervention:  Did not attend  Summary of Progress/Problems:  Bryan [name] 07/14/2017, 3:41 PM

## 2017-07-14 NOTE — [facility] Group Notes (Signed)
[facility] LCSW Group Therapy Note  Date/Time:  07/14/2017  11:00AM-12:00PM  Type of Therapy and Topic:  Group Therapy:  Music and Mood  Participation Level:  Minimal   Description of Group: In this process group, members listened to a variety of genres of music and identified that different types of music evoke different responses.  Patients were encouraged to identify music that was soothing for them and music that was energizing for them.  Patients discussed how this knowledge can help with wellness and recovery in various ways including managing depression and anxiety as well as encouraging healthy sleep habits.    Therapeutic Goals: 1. Patients will explore the impact of different varieties of music on mood 2. Patients will verbalize the thoughts they have when listening to different types of music 3. Patients will identify music that is soothing to them as well as music that is energizing to them 4. Patients will discuss how to use this knowledge to assist in maintaining wellness and recovery 5. Patients will explore the use of music as a coping skill  Summary of Patient Progress:  At the beginning of group, patient expressed that he felt "alright" and at the end of group, during which he left several times but returned, he said he felt "a little better."  Therapeutic Modalities: Solution Focused Brief Therapy Activity   Ambrose Mantle, LCSW

## 2017-07-14 NOTE — Plan of Care (Signed)
D: Patient observed in bed resting with eyes open. Patient denies SI/HI and A/V hallucinations and none observed. Patient is isolative to his room most of shift. He voiced no concerns.  A:Patient provided support and encouragement. Q 15 minute checks in progress for safety.  R:Patient is taking medications as ordered. Patient did sleep tonight uninterrupted. Patient remains safe on unit. Monitoring continues.   Problem: Safety: Goal: Periods of time without injury will increase Outcome: Progressing Note:  Patient denies SI and remains safe on unit.

## 2017-07-15 NOTE — Progress Notes (Signed)
[facility] MD Progress Note  07/15/2017 3:44 PM Bryan [name]  MRN:  [id number] Subjective:    Evaluation:  Bryan [name] seen resting in bed. Reports " I am doing alright".Per treatment team is was reported that patient attended group session with minimal participation.  Bryan [name] continues to present flat and guarded. Patient is requesting to be discharge. Some visibility on the unit. continues to denies suicidal or homicidal ideations  Reports he is taken medications as prescribed and tolerating medications well. Patient to be consider for discharged this week. NP to follow-up with CSW for discharge disposition.Patient continues to need constant encouragement with hygiene. Support, encouragement and reassurance was provided.  History: Bryan [name] is a 42 y/o M with history of schizophrenia admitted on IVC placed by family due to not taking care of ADL's, agitation episodes of destroying his home/punching holes in walls, and poor medication adherence. Pt was disorganized, flat, and thought blocking on initial presentation. Pt has also been reporting new-onset of myoclonic jerks which have continued during his hospitalization, but they appear to occur mostly when interacting with treatment team members suggesting they may be induced by anxiety rather than a neurologic cause. He was started on [place] which he tolerated without difficulty, and he was transitioned to long-acting injectable form of [place] of which he received first dose on 5/13, and we are anticipating booster injection to be given on today, 5/16. Pt has been reporting improvement of his presenting symptoms.   Principal Problem: Undifferentiated schizophrenia (HCC) Diagnosis:   Patient Active Problem List   Diagnosis Date Noted  . Undifferentiated schizophrenia (HCC) [F20.3]   . 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 Illiterate [Z55.0] 10/30/2011  . Schizoaffective disorder, depressive type (HCC) [F25.1] 10/27/2011  . Schizoaffective  disorder (HCC) [F25.9] 08/10/2011  . Observed seizure-like activity (HCC) [R56.9] 08/09/2011  . PAIN IN JOINT, MULTIPLE SITES [M25.50] 02/16/2010  . FATIGUE [R53.81, R53.83] 02/16/2010  . Shortness of breath [R06.02] 02/16/2010   Total Time spent with patient: 30 minutes  Past Psychiatric History: see H&P  Past Medical History:  Past Medical History:  Diagnosis Date  . Hypertension   . Psychiatric problem    Seen at [facility] for unknown reason. Takes seroquel. History of hearing voices.  Not commandivng voices.   . Schizophrenia (HCC)   . Tremors of nervous system     Past Surgical History:  Procedure Laterality Date  . none     Family History: History reviewed. No pertinent family history. Family Psychiatric  History: see H&P Social History:  Social History   Substance and Sexual Activity  Alcohol Use Yes   Comment: says drinks a 40 at least once a week. denies drink in 2-3 weeks.      Social History   Substance and Sexual Activity  Drug Use Yes  . Types: Cocaine, Marijuana   Comment: marijuana somewhat regularly    Social History   Socioeconomic History  . Marital status: Married    Spouse name: Not on file  . Number of children: Not on file  . Years of education: Not on file  . Highest education level: Not on file  Occupational History  . Not on file  Social Needs  . Financial resource strain: Not on file  . Food insecurity:    Worry: Not on file    Inability: Not on file  . Transportation needs:    Medical: Not on file    Non-medical: Not on file  Tobacco Use  . Smoking status: Current  Every Day Smoker    Packs/day: 0.50    Types: Cigarettes  . Smokeless tobacco: Never Used  Substance and Sexual Activity  . Alcohol use: Yes    Comment: says drinks a 40 at least once a week. denies drink in 2-3 weeks.   . Drug use: Yes    Types: Cocaine, Marijuana    Comment: marijuana somewhat regularly  . Sexual activity: Never  Lifestyle  . Physical activity:     Days per week: Not on file    Minutes per session: Not on file  . Stress: Not on file  Relationships  . Social connections:    Talks on phone: Not on file    Gets together: Not on file    Attends religious service: Not on file    Active member of club or organization: Not on file    Attends meetings of clubs or organizations: Not on file    Relationship status: Not on file  Other Topics Concern  . Not on file  Social History Narrative   LIves in [place] in an apartment with his brother. Unclear why on medicaid.   Additional Social History:                         Sleep: Good  Appetite:  Good  Current Medications: Current Facility-Administered Medications  Medication Dose Route Frequency Provider Last Rate Last Dose  . acetaminophen (TYLENOL) tablet 650 mg  650 mg Oral Q6H PRN Laveda Abbe, NP      . alum & mag hydroxide-simeth (MAALOX/MYLANTA) 200-200-20 MG/5ML suspension 30 mL  30 mL Oral Q4H PRN Laveda Abbe, NP      . feeding supplement (ENSURE ENLIVE) (ENSURE ENLIVE) liquid 237 mL  237 mL Oral Q24H Micheal Likens, MD   237 mL at 07/15/17 0818  . gabapentin (NEURONTIN) capsule 200 mg  200 mg Oral BID Oneta Rack, NP   200 mg at 07/15/17 0818  . ibuprofen (ADVIL,MOTRIN) tablet 400 mg  400 mg Oral Q6H PRN Withrow, Everardo All, FNP      . LORazepam (ATIVAN) tablet 1 mg  1 mg Oral Q6H PRN Cobos, Rockey Situ, MD   1 mg at 07/08/17 2117  . magnesium hydroxide (MILK OF MAGNESIA) suspension 30 mL  30 mL Oral Daily PRN Laveda Abbe, NP      . paliperidone [facility] SUSTENNA) injection 156 mg  156 mg Intramuscular Q30 days Micheal Likens, MD   156 mg at 07/11/17 1312  . paliperidone (INVEGA) 24 hr tablet 6 mg  6 mg Oral Daily Laveda Abbe, NP   6 mg at 07/15/17 0818  . traZODone (DESYREL) tablet 50 mg  50 mg Oral QHS PRN Laveda Abbe, NP   50 mg at 07/14/17 2145    Lab Results: No results found for this or  any previous visit (from the past 48 hour(s)).  Blood Alcohol level:  Lab Results  Component Value Date   ETH <10 07/05/2017   ETH <10 02/16/2017    Metabolic Disorder Labs: No results found for: HGBA1C, MPG No results found for: PROLACTIN No results found for: CHOL, TRIG, HDL, CHOLHDL, VLDL, LDLCALC  Physical Findings: AIMS: Facial and Oral Movements Muscles of Facial Expression: None, normal Lips and Perioral Area: None, normal Jaw: None, normal Tongue: None, normal,Extremity Movements Upper (arms, wrists, hands, fingers): None, normal Lower (legs, knees, ankles, toes): None, normal, Trunk Movements Neck, shoulders, hips: None,  normal, Overall Severity Severity of abnormal movements (highest score from questions above): None, normal Incapacitation due to abnormal movements: None, normal Patient's awareness of abnormal movements (rate only patient's report): No Awareness, Dental Status Current problems with teeth and/or dentures?: No Does patient usually wear dentures?: No  CIWA:  CIWA-Ar Total: 1 COWS:  COWS Total Score: 2  Musculoskeletal: Strength & Muscle Tone: within normal limits Gait & Station: normal Patient leans: N/A  Psychiatric Specialty Exam: Physical Exam  Nursing note and vitals reviewed. Constitutional: He appears well-developed.  Neurological: He is alert.  Psychiatric: He has a normal mood and affect. His behavior is normal.    Review of Systems  Psychiatric/Behavioral: Negative for depression, hallucinations and suicidal ideas. The patient is not nervous/anxious and does not have insomnia.   All other systems reviewed and are negative.   Blood pressure 115/79, pulse 80, temperature 98.1 F (36.7 C), resp. rate 18, height 5' 9.5" (1.765 m), weight 70.3 kg (155 lb).Body mass index is 22.56 kg/m.  General Appearance: Casual and Disheveled  Eye Contact:  Good  Speech:  Clear and Coherent and Normal Rate  Volume:  Normal  Mood:  Euthymic  Affect:   Congruent and Flat  Thought Process:  Coherent and Goal Directed  Orientation:  Full (Time, Place, and Person)  Thought Content:  Logical  Suicidal Thoughts:  No  Homicidal Thoughts:  No  Memory:  Immediate;   Fair Recent;   Fair Remote;   Fair  Judgement:  Poor  Insight:  Lacking  Psychomotor Activity:  Normal  Concentration:  Concentration: Fair  Recall:  [employer] of Knowledge:  Fair  Language:  Fair  Akathisia:  No  Handed:    AIMS (if indicated):     Assets:  Desire for Improvement Housing Physical Health Resilience Social Support  ADL's:  Intact  Cognition:  WNL  Sleep:  Number of Hours: 5   Treatment Plan Summary: Daily contact with patient to assess and evaluate symptoms and progress in treatment and Medication management   Continue with current treatment plan on 07/15/2017 except were noted as listed below  -Schizophrenia -Continue Invega  po qDay -Continue Invega Sustenna  IM q30days (starting on today 07/11/17; Hinda Glatter Sustenna  IM administered on 07/08/17)  -Anxiety -Continues  gabapentin  200 mg po BID  -Continue ativan  po q6h prn anxiety  - Insomnia -Continue trazodone  po qhs prn insomnia  -Encourage participation in groups and therapeutic milieu -Disposition planning will be ongoing  Oneta Rack, NP 07/15/2017, 3:44 PM

## 2017-07-15 NOTE — [facility] Group Notes (Signed)
LCSW Group Therapy Note   07/15/2017 1:15pm   Type of Therapy and Topic:  Group Therapy:  Overcoming Obstacles   Participation Level:  Minimal   Description of Group:    In this group patients will be encouraged to explore what they see as obstacles to their own wellness and recovery. They will be guided to discuss their thoughts, feelings, and behaviors related to these obstacles. The group will process together ways to cope with barriers, with attention given to specific choices patients can make. Each patient will be challenged to identify changes they are motivated to make in order to overcome their obstacles. This group will be process-oriented, with patients participating in exploration of their own experiences as well as giving and receiving support and challenge from other group members.   Therapeutic Goals: 1. Patient will identify personal and current obstacles as they relate to admission. 2. Patient will identify barriers that currently interfere with their wellness or overcoming obstacles.  3. Patient will identify feelings, thought process and behaviors related to these barriers. 4. Patient will identify two changes they are willing to make to overcome these obstacles:      Summary of Patient Progress  Reluctant to attend. "I have no energy."  Came with cajoling.  Very vague in his descriptions.  "I need to follow things through to completion and be more careful.  I will be more aware."     Therapeutic Modalities:   Cognitive Behavioral Therapy Solution Focused Therapy Motivational Interviewing Relapse Prevention Therapy  Ida Rogue, LCSW 07/15/2017 3:08 PM

## 2017-07-15 NOTE — Plan of Care (Signed)
  Problem: Safety: Goal: Periods of time without injury will increase Outcome: Progressing   

## 2017-07-15 NOTE — Progress Notes (Signed)
Pt presents with a flat affect and depressed mood. Pt rated on his self inventory sheet: depression 7/10, Anxiety 4/10 and hopelessness 5/10. Pt denies SI/HI. Pt denies AVH. Pt reports fair sleep at bedtime. Pt appears to have thought blocking and delayed responses during assessment. Pt is isolative to his room but will attend meals. Pt declined to attend rec therapy when offered. No concerns verbalized by pt.  Medications reviewed with pt. Verbal support provided. Pt encouraged to attend groups. 15 minute checks performed for safety.   Pt compliant with tx plan. No side effects to meds verbalized by pt.

## 2017-07-16 NOTE — Progress Notes (Signed)
Pt continues to isolate in his room. Pt refused to attend groups when asked to attend. Pt will only leave his room to attend meals. Pt continues to have delayed responses, thought blocking and paranoia.

## 2017-07-16 NOTE — Progress Notes (Signed)
D: Patient denies SI, HI or AVH. Patient presents as flat, minimal and isolative.  Pt. Approached the nurses station requesting towels and toiletries to shower and denied any other needs at that time. Pt. Otherwise calm and cooperative.  A: Patient given emotional support from RN. Patient encouraged to come to staff with concerns and/or questions. Patient's medication routine continued. Patient's orders and plan of care reviewed.   R: Patient remains appropriate and cooperative. Will continue to monitor patient q15 minutes for safety.

## 2017-07-16 NOTE — Tx Team (Signed)
Interdisciplinary Treatment and Diagnostic Plan Update  07/16/2017 Time of Session: 9:44 AM  Bryan [name] MRN: [id number]  Principal Diagnosis: Undifferentiated schizophrenia [facility])  Secondary Diagnoses: Principal Problem:   Undifferentiated schizophrenia ([place]) Active Problems:   Delusional disorder ([place])   Current Medications:  Current Facility-Administered Medications  Medication Dose Route Frequency Provider Last Rate Last Dose  . acetaminophen (TYLENOL) tablet 650 mg  650 mg Oral Q6H PRN Ethelene Hal, NP      . alum & mag hydroxide-simeth (MAALOX/MYLANTA) 200-200-20 MG/5ML suspension 30 mL  30 mL Oral Q4H PRN Ethelene Hal, NP      . feeding supplement (ENSURE ENLIVE) (ENSURE ENLIVE) liquid 237 mL  237 mL Oral Q24H Pennelope Bracken, MD   237 mL at 07/16/17 0751  . gabapentin (NEURONTIN) capsule 200 mg  200 mg Oral BID Derrill Center, NP   200 mg at 07/16/17 0750  . ibuprofen (ADVIL,MOTRIN) tablet 400 mg  400 mg Oral Q6H PRN Withrow, Elyse Jarvis, FNP      . LORazepam (ATIVAN) tablet 1 mg  1 mg Oral Q6H PRN Cobos, Myer Peer, MD   1 mg at 07/08/17 2117  . magnesium hydroxide (MILK OF MAGNESIA) suspension 30 mL  30 mL Oral Daily PRN Ethelene Hal, NP      . paliperidone [facility] SUSTENNA) injection 156 mg  156 mg Intramuscular Q30 days Pennelope Bracken, MD   156 mg at 07/11/17 1312  . paliperidone (INVEGA) 24 hr tablet 6 mg  6 mg Oral Daily Ethelene Hal, NP   6 mg at 07/16/17 0750  . traZODone (DESYREL) tablet 50 mg  50 mg Oral QHS PRN Ethelene Hal, NP   50 mg at 07/14/17 2145    PTA Medications: Medications Prior to Admission  Medication Sig Dispense Refill Last Dose  . benztropine (COGENTIN) 1 MG tablet Take 1 mg by mouth 2 (two) times daily.   unknown  . diphenhydrAMINE (BENADRYL) 25 MG tablet Take 1 tablet (25 mg total) by mouth every 6 (six) hours. (Patient not taking: Reported on 07/05/2017) 20 tablet 0 Not Taking at  Unknown time  . fluPHENAZine (PROLIXIN) 10 MG tablet Take 10-20 mg by mouth daily. Take 45m by mouth daily for 7 seven days, then take 253mby mouth thereafter daily.   unknown  . OLANZapine (ZYPREXA) 5 MG tablet Take 5 mg by mouth at bedtime.   unknown    Patient Stressors: Medication change or noncompliance Substance abuse  Patient Strengths: Average or above average intelligence Supportive family/friends  Treatment Modalities: Medication Management, Group therapy, Case management,  1 to 1 session with clinician, Psychoeducation, Recreational therapy.   Physician Treatment Plan for Primary Diagnosis: [place]differentiated [place] Improvement in symptoms so as ready for discharge  Short Term Goals: Ability to demonstrate self-control will improve Ability to maintain clinical measurements within normal limits will improve Compliance with prescribed medications will improve  Medication Management: Evaluate patient's response, side effects, and tolerance of medication regimen.  Therapeutic Interventions: 1 to 1 sessions, Unit Group sessions and Medication administration.  Evaluation of Outcomes: Adequate for Discharge  Physician Treatment Plan for Secondary Diagnosis: Principal Problem:   Undifferentiated schizophrenia ([place] Problems:   Delusional disorder ([place]  Long Term Goal(s): Improvement in symptoms so as ready for discharge  Short Term Goals: Ability to demonstrate self-control will improve Ability to maintain clinical measurements within normal limits will improve Compliance with prescribed medications will improve  Medication  Management: Evaluate patient's response, side effects, and tolerance of medication regimen.  Therapeutic Interventions: 1 to 1 sessions, Unit Group sessions and Medication administration.  Evaluation of Outcomes: Adequate for Discharge   RN Treatment Plan for Primary Diagnosis: Undifferentiated  schizophrenia ([place]) Long Term Goal(s): Knowledge of disease and therapeutic regimen to maintain health will improve  Short Term Goals: Ability to identify and develop effective coping behaviors will improve and Compliance with prescribed medications will improve  Medication Management: RN will administer medications as ordered by provider, will assess and evaluate patient's response and provide education to patient for prescribed medication. RN will report any adverse and/or side effects to prescribing provider.  Therapeutic Interventions: 1 on 1 counseling sessions, Psychoeducation, Medication administration, Evaluate responses to treatment, Monitor vital signs and CBGs as ordered, Perform/monitor CIWA, COWS, AIMS and Fall Risk screenings as ordered, Perform wound care treatments as ordered.  Evaluation of Outcomes: Adequate for Discharge   LCSW Treatment Plan for Primary Diagnosis: Undifferentiated schizophrenia ([place]) Long Term Goal(s): Safe transition to appropriate next level of care at discharge, Engage patient in therapeutic group addressing interpersonal concerns.  Short Term Goals: Engage patient in aftercare planning with referrals and resources  Therapeutic Interventions: Assess for all discharge needs, 1 to 1 time with Social worker, Explore available resources and support systems, Assess for adequacy in community support network, Educate family and significant other(s) on suicide prevention, Complete Psychosocial Assessment, Interpersonal group therapy.  Evaluation of Outcomes: Met  Return home, follow up ACT team   Progress in Treatment: Attending groups: Intermittently Participating in groups: Minimally Taking medication as prescribed: Yes Toleration medication: Yes, no side effects reported at this time Family/Significant other contact made: Yes Patient understands diagnosis:No Limited insight Discussing patient identified problems/goals with staff: Yes Medical problems  stabilized or resolved: Yes Denies suicidal/homicidal ideation: Yes Issues/concerns per patient self-inventory: None Other: N/A  New problem(s) identified: None identified at this time.   New Short Term/Long Term Goal(s): None identified at this time.   Discharge Plan or Barriers:   Reason for Continuation of Hospitalization:  Medication stabilization   Estimated Length of Stay: [place] d/c tomorrow  Attendees: Patient: 07/16/2017  9:44 AM  Physician: Maris Berger, MD 07/16/2017  9:44 AM  Nursing: Darrol Angel, RN 07/16/2017  9:44 AM  RN Care Manager: Lars Pinks, RN 07/16/2017  9:44 AM  Social Worker: Ripley Fraise 07/16/2017  9:44 AM  Recreational Therapist: Winfield Cunas 07/16/2017  9:44 AM  Other: Norberto Sorenson 07/16/2017  9:44 AM  Other:  07/16/2017  9:44 AM    Scribe for Treatment Team:  Roque Lias LCSW 07/16/2017 9:44 AM

## 2017-07-16 NOTE — Plan of Care (Signed)
  Problem: Coping: Goal: Ability to demonstrate self-control will improve Outcome: Progressing   Problem: Safety: Goal: Periods of time without injury will increase Outcome: Progressing   

## 2017-07-16 NOTE — Progress Notes (Signed)
[facility] MD Progress Note  07/16/2017 1:55 PM Bryan [name]  MRN:  [id number]  Subjective: Bryan [name] reports, "I'm doing alright. I'm not really depressed. I came to the hospital because I was going through some phase. I had put a lot of stuff in my system. I found myself ahm, ahm, ahm, just like..., I'm just up to .Marland Kitchen...  Bryan [name] is a 42 y/o M with history of schizophrenia admitted on IVC placed by family due to not taking care of ADL's, agitation episodes of destroying his home/punching holes in walls, and poor medication adherence. Pt was disorganized, flat, and thought blocking on initial presentation. Pt has also been reporting new-onset of myoclonic jerks which have continued during his hospitalization, but they appear to occur mostly when interacting with treatment team members suggesting they may be induced by anxiety rather than a neurologic cause. He was started on [place] which he tolerated without difficulty, and he was transitioned to long-acting injectable form of [place] of which he received first dose on 5/13, and we are anticipating booster injection to be given on today, 5/16. Pt has been reporting improvement of his presenting symptoms.  Today, Bryan [name] is seen, chart reviewed. The chart findings discussed with the treatment team. He is alert, oriented to self. He is making good eye contact. He is visible on the unit, attending some group milieu. Today, Bryan [name] was seen, sitting quietly in the Day room. He is following command. He is verbally responsive. He is in his street clothes, looking groomed. Although, verbally responsive, he seem to have thought blocking episodes. He is finding it difficult completing a sentence. He could not present his thoughts clearly. He seem to know what to say, however, has difficult clearing his mind. He does not appear today, to be responding to any any internal stimuli. He is tolerating his medication regimen, no side effects reported. Sleep &  appetite, good. Patient at this time, seem to have reached his baseline.  Principal Problem: Undifferentiated schizophrenia (HCC)  Diagnosis:   Patient Active Problem List   Diagnosis Date Noted  . Undifferentiated schizophrenia (HCC) [F20.3]     Priority: High  . 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 Illiterate [Z55.0] 10/30/2011  . Schizoaffective disorder, depressive type (HCC) [F25.1] 10/27/2011  . Schizoaffective disorder (HCC) [F25.9] 08/10/2011  . Observed seizure-like activity (HCC) [R56.9] 08/09/2011  . PAIN IN JOINT, MULTIPLE SITES [M25.50] 02/16/2010  . FATIGUE [R53.81, R53.83] 02/16/2010  . Shortness of breath [R06.02] 02/16/2010   Total Time spent with patient: 15 minutes  Past Psychiatric History: See H&P  Past Medical History:  Past Medical History:  Diagnosis Date  . Hypertension   . Psychiatric problem    Seen at [facility] for unknown reason. Takes seroquel. History of hearing voices.  Not commandivng voices.   . Schizophrenia (HCC)   . Tremors of nervous system     Past Surgical History:  Procedure Laterality Date  . none     Family History: History reviewed. No pertinent family history.  Family Psychiatric  History: See H&P  Social History:  Social History   Substance and Sexual Activity  Alcohol Use Yes   Comment: says drinks a 40 at least once a week. denies drink in 2-3 weeks.      Social History   Substance and Sexual Activity  Drug Use Yes  . Types: Cocaine, Marijuana   Comment: marijuana somewhat regularly    Social History   Socioeconomic History  . Marital status: Married  Spouse name: Not on file  . Number of children: Not on file  . Years of education: Not on file  . Highest education level: Not on file  Occupational History  . Not on file  Social Needs  . Financial resource strain: Not on file  . Food insecurity:    Worry: Not on file    Inability: Not on file  . Transportation needs:    Medical: Not on file     Non-medical: Not on file  Tobacco Use  . Smoking status: Current Every Day Smoker    Packs/day: 0.50    Types: Cigarettes  . Smokeless tobacco: Never Used  Substance and Sexual Activity  . Alcohol use: Yes    Comment: says drinks a 40 at least once a week. denies drink in 2-3 weeks.   . Drug use: Yes    Types: Cocaine, Marijuana    Comment: marijuana somewhat regularly  . Sexual activity: Never  Lifestyle  . Physical activity:    Days per week: Not on file    Minutes per session: Not on file  . Stress: Not on file  Relationships  . Social connections:    Talks on phone: Not on file    Gets together: Not on file    Attends religious service: Not on file    Active member of club or organization: Not on file    Attends meetings of clubs or organizations: Not on file    Relationship status: Not on file  Other Topics Concern  . Not on file  Social History Narrative   LIves in [place] in an apartment with his brother. Unclear why on medicaid.   Additional Social History:   Sleep: Good  Appetite:  Good  Current Medications: Current Facility-Administered Medications  Medication Dose Route Frequency Provider Last Rate Last Dose  . acetaminophen (TYLENOL) tablet 650 mg  650 mg Oral Q6H PRN Laveda Abbe, NP      . alum & mag hydroxide-simeth (MAALOX/MYLANTA) 200-200-20 MG/5ML suspension 30 mL  30 mL Oral Q4H PRN Laveda Abbe, NP      . feeding supplement (ENSURE ENLIVE) (ENSURE ENLIVE) liquid 237 mL  237 mL Oral Q24H Micheal Likens, MD   237 mL at 07/16/17 0751  . gabapentin (NEURONTIN) capsule 200 mg  200 mg Oral BID Oneta Rack, NP   200 mg at 07/16/17 0750  . ibuprofen (ADVIL,MOTRIN) tablet 400 mg  400 mg Oral Q6H PRN Withrow, Everardo All, FNP      . LORazepam (ATIVAN) tablet 1 mg  1 mg Oral Q6H PRN Cobos, Rockey Situ, MD   1 mg at 07/08/17 2117  . magnesium hydroxide (MILK OF MAGNESIA) suspension 30 mL  30 mL Oral Daily PRN Laveda Abbe, NP       . paliperidone [facility] SUSTENNA) injection 156 mg  156 mg Intramuscular Q30 days Micheal Likens, MD   156 mg at 07/11/17 1312  . paliperidone (INVEGA) 24 hr tablet 6 mg  6 mg Oral Daily Laveda Abbe, NP   6 mg at 07/16/17 0750  . traZODone (DESYREL) tablet 50 mg  50 mg Oral QHS PRN Laveda Abbe, NP   50 mg at 07/14/17 2145   Lab Results: No results found for this or any previous visit (from the past 48 hour(s)).  Blood Alcohol level:  Lab Results  Component Value Date   ETH <10 07/05/2017   ETH <10 02/16/2017   Metabolic Disorder Labs:  No results found for: HGBA1C, MPG No results found for: PROLACTIN No results found for: CHOL, TRIG, HDL, CHOLHDL, VLDL, LDLCALC  Physical Findings: AIMS: Facial and Oral Movements Muscles of Facial Expression: None, normal Lips and Perioral Area: None, normal Jaw: None, normal Tongue: None, normal,Extremity Movements Upper (arms, wrists, hands, fingers): None, normal Lower (legs, knees, ankles, toes): None, normal, Trunk Movements Neck, shoulders, hips: None, normal, Overall Severity Severity of abnormal movements (highest score from questions above): None, normal Incapacitation due to abnormal movements: None, normal Patient's awareness of abnormal movements (rate only patient's report): No Awareness, Dental Status Current problems with teeth and/or dentures?: No Does patient usually wear dentures?: No  CIWA:  CIWA-Ar Total: 1 COWS:  COWS Total Score: 2  Musculoskeletal: Strength & Muscle Tone: within normal limits Gait & Station: normal Patient leans: N/A  Psychiatric Specialty Exam: Physical Exam  Nursing note and vitals reviewed. Constitutional: He appears well-developed.  Neurological: He is alert.  Psychiatric: He has a normal mood and affect. His behavior is normal.    Review of Systems  Psychiatric/Behavioral: Negative for depression, hallucinations and suicidal ideas. The patient is not  nervous/anxious and does not have insomnia.   All other systems reviewed and are negative.   Blood pressure (!) 126/93, pulse 85, temperature 98.4 F (36.9 C), temperature source Oral, resp. rate 18, height 5' 9.5" (1.765 m), weight 70.3 kg (155 lb).Body mass index is 22.56 kg/m.  General Appearance: Casual and Disheveled  Eye Contact:  Good  Speech:  Clear and Coherent and Normal Rate  Volume:  Normal  Mood:  Euthymic  Affect:  Congruent and Flat  Thought Process:  Coherent and Goal Directed  Orientation:  Full (Time, Place, and Person)  Thought Content:  Logical  Suicidal Thoughts:  No  Homicidal Thoughts:  No  Memory:  Immediate;   Fair Recent;   Fair Remote;   Fair  Judgement:  Poor  Insight:  Lacking  Psychomotor Activity:  Normal  Concentration:  Concentration: Fair  Recall:  [employer] of Knowledge:  Fair  Language:  Fair  Akathisia:  No  Handed:    AIMS (if indicated):     Assets:  Desire for Improvement Housing Physical Health Resilience Social Support  ADL's:  Intact  Cognition:  WNL  Sleep:  Number of Hours: 5.75   Treatment Plan Summary: Daily contact with patient to assess and evaluate symptoms and progress in treatment and Medication management   -Continue inpatient hospitalization.  -Will continue today 07/16/2017 plan as below except where it is noted.  -Schizophrenia -Continue Invega  po qDay -Continue Invega Sustenna 156 mg IM Q 30 days (starting on 07/11/17; Hinda Glatter Sustenna 234 mg IM administered on 07/08/17)  -Anxiety -Continues  gabapentin  200 mg po BID  -Continue ativan  po q6h prn anxiety  - Insomnia -Continue trazodone  po qhs prn insomnia  -Encourage participation in groups and therapeutic milieu -Disposition planning will be ongoing  Armandina Stammer, NP, PMHNP, FNP-BC. 07/16/2017, 1:55 PMPatient ID: [name], male   DOB: July 29, 1975, 42 y.o.   MRN:  [id number]

## 2017-07-16 NOTE — BHH Group Notes (Signed)
LCSW Group Therapy 07/16/2017 1:15pm  Type of Therapy and Topic:  Group Therapy:  Change and Accountability  Participation Level:  Did Not Attend  Description of Group In this group, patients discussed power and accountability for change.  The group identified the challenges related to accountability and the difficulty of accepting the outcomes of negative behaviors.  Patients were encouraged to openly discuss a challenge/change they could take responsibility for.  Patients discussed the use of "change talk" and positive thinking as ways to support achievement of personal goals.  The group discussed ways to give support and empowerment to peers.  Therapeutic Goals: 1. Patients will state the relationship between personal power and accountability in the change process 2. Patients will identify the positive and negative consequences of a personal choice they have made 3. Patients will identify one challenge/choice they will take responsibility for making 4. Patients will discuss the role of "change talk" and the impact of positive thinking as it supports successful personal change 5. Patients will verbalize support and affirmation of change efforts in peers  Summary of Patient Progress:    Therapeutic Modalities Solution Focused Brief Therapy Motivational Interviewing Cognitive Behavioral Therapy  Ida Rogue, LCSW 07/16/2017 2:10 PM

## 2017-07-16 NOTE — Progress Notes (Signed)
Did not attend group 

## 2017-07-16 NOTE — Progress Notes (Addendum)
Pt presents with a flat affect and depressed mood. Pt rated on his self inventory sheet: depression 5/10, anxiety 5/10 and hopelessness 5/10. Pt reports good sleep last night.  Pt denies SI/HI. Pt denies AVH. Pt noted to have improved hygiene today. Pt continues to be isolative to his room but will attend meals. Pt stated goal for today is to work on d/c plans. Pt stated that he have a safe place to return to. No concerns verbalized by pt.   Orders reviewed with pt. Verbal support provided. Pt encouraged to attend groups. 15 minute checks performed for safety.  Pt compliant with tx plan.

## 2017-07-17 MED ORDER — PALIPERIDONE PALMITATE ER 156 MG/ML IM SUSY: 156.0000 mg | PREFILLED_SYRINGE | INTRAMUSCULAR | 0 refills | Status: DC

## 2017-07-17 MED ORDER — TRAZODONE HCL 50 MG PO TABS: 50.0000 mg | ORAL_TABLET | Freq: Every evening | ORAL | 0 refills | Status: DC | PRN

## 2017-07-17 MED ORDER — BENZTROPINE MESYLATE 1 MG PO TABS: 1.0000 mg | ORAL_TABLET | Freq: Two times a day (BID) | ORAL | 0 refills | Status: DC

## 2017-07-17 MED ORDER — GABAPENTIN 100 MG PO CAPS: 200.0000 mg | ORAL_CAPSULE | Freq: Two times a day (BID) | ORAL | 0 refills | Status: DC

## 2017-07-17 MED ORDER — PALIPERIDONE ER 6 MG PO TB24: 6.0000 mg | ORAL_TABLET | Freq: Every day | ORAL | 0 refills | Status: DC

## 2017-07-17 NOTE — Progress Notes (Signed)
Discharge note:  Patient discharged home per MD order.  Reviewed AVS/transition with patient and he indicated understanding.  Patient received all personal belongings from locker and unit.  He denies any thoughts of self harm.  Patient left ambulatory with a friend.

## 2017-07-17 NOTE — BHH Suicide Risk Assessment (Signed)
[facility] Discharge Suicide Risk Assessment   Principal Problem: Undifferentiated schizophrenia [facility]) Discharge Diagnoses:  Patient Active Problem List   Diagnosis Date Noted  . Undifferentiated schizophrenia (HCC) [F20.3]   . 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 Illiterate [Z55.0] 10/30/2011  . Schizoaffective disorder, depressive type (HCC) [F25.1] 10/27/2011  . Schizoaffective disorder (HCC) [F25.9] 08/10/2011  . Observed seizure-like activity (HCC) [R56.9] 08/09/2011  . PAIN IN JOINT, MULTIPLE SITES [M25.50] 02/16/2010  . FATIGUE [R53.81, R53.83] 02/16/2010  . Shortness of breath [R06.02] 02/16/2010    Total Time spent with patient: 30 minutes  Musculoskeletal: Strength & Muscle Tone: within normal limits Gait & Station: normal Patient leans: N/A  Psychiatric Specialty Exam: Review of Systems  Constitutional: Negative for chills and fever.  Respiratory: Negative for cough and shortness of breath.   Cardiovascular: Negative for chest pain.  Gastrointestinal: Negative for abdominal pain, heartburn, nausea and vomiting.  Psychiatric/Behavioral: Negative for depression, hallucinations and suicidal ideas. The patient is not nervous/anxious and does not have insomnia.     Blood pressure 100/76, pulse (!) 112, temperature 98.5 F (36.9 C), temperature source Oral, resp. rate 20, height 5' 9.5" (1.765 m), weight 70.3 kg (155 lb).Body mass index is 22.56 kg/m.  General Appearance: Casual and Fairly Groomed  Patent attorney::  Good  Speech:  Clear and Coherent and Normal Rate  Volume:  Normal  Mood:  Euthymic  Affect:  Congruent  Thought Process:  Coherent and Goal Directed  Orientation:  Full (Time, Place, and Person)  Thought Content:  Logical  Suicidal Thoughts:  No  Homicidal Thoughts:  No  Memory:  Immediate;   Fair Recent;   Fair Remote;   Fair  Judgement:  Fair  Insight:  Lacking  Psychomotor Activity:  Normal  Concentration:  Fair  Recall:  [employer] of Knowledge:Fair   Language: Fair  Akathisia:  No  Handed:    AIMS (if indicated):     Assets:  Communication Skills Physical Health Resilience  Sleep:  Number of Hours: 6.75  Cognition: WNL  ADL's:  Intact   Mental Status Per Nursing Assessment::   On Admission:  Suicidal ideation indicated by others, Self-harm behaviors  Demographic Factors:  Male, Low socioeconomic status, Living alone and Unemployed  Loss Factors: NA  Historical Factors: Impulsivity  Risk Reduction Factors:   Positive social support, Positive therapeutic relationship and Positive coping skills or problem solving skills  Continued Clinical Symptoms:  Schizophrenia:   Paranoid or undifferentiated type  Cognitive Features That Contribute To Risk:  None    Suicide Risk:  Minimal: No identifiable suicidal ideation.  Patients presenting with no risk factors but with morbid ruminations; may be classified as minimal risk based on the severity of the depressive symptoms  Follow-up Information    [facility] Follow up on 07/22/2017.   Why:  Monday at 1:00 for an assessment for services.  They will come to your home to meet with you.  Please call them to confirm that you can see them at that time. Contact information: [address] [place] [address] [phone]        [facility] Follow up on 07/19/2017.   Specialty:  Behavioral Health Why:  Friday at 1:10 with Merlyn Albert for your hospital follow up appointment.  Please bring along your hospital d/c paperwork. Also, the hospital transitional care team is available to help you with transportation,and other needs. Valerie [phone] information: [address] [place] [address] [phone]  Subjective Data:  Bryan [name] is a 42 y/o M with history of schizophrenia admitted on IVC placed by family due to not taking care of ADL's, agitation episodes of destroying his home/punching holes in walls, and poor medication adherence.  Pt was disorganized, flat, and thought blocking on initial presentation. Pt has also been reporting new-onset of myoclonic jerks which have continued during his hospitalization, but they appear to occur mostly when interacting with treatment team members suggesting they may be induced by anxiety rather than a neurologic cause. He was started on [place] which he tolerated without difficulty, and he was transitioned to long-acting injectable form of [place] which he received first dose on 5/13, and we are anticipating booster injection to be given on today, 5/16. Pt has been reporting improvement of his presenting symptoms.  Today upon evaluation,pt shares, "I'm good. I'm just ready to go." Pt denies any specific concerns. He is sleeping well. His appetite is good. He denies physical complaints. He denies SI/HI/AH/VH. He is tolerating his medication well, and he reports it has been helpful for him, stating, "It helps me to relax better and think better." He is in agreement to continue his current treatment regimen without changes. He is in agreement to have follow up with ACT team, and he plans to return to living at his apartment with his family checking in on him as well. He was able to engage in safety planning including plan to return to [facility] or contact emergency services if he feels unable to maintain his own safety or the safety of others. Pt had no further questions, comments, or concerns.   Plan Of Care/Follow-up recommendations:   -Discharge to outpatient level of care  -Schizophrenia -Continue Invega  po qDay -Continue Invega Sustenna  IM q30days (administered on 07/11/17; Hinda Glatter Sustenna  IM administered on 07/08/17)  -Anxiety -Continue gabapentin  po BID  - Insomnia -Continue trazodone  po qhs prn insomnia  Activity:  as tolerated Diet:  normal Tests:  NA Other:  see above for DC  plan  Micheal Likens, MD 07/17/2017, 8:27 AM

## 2017-07-17 NOTE — Discharge Summary (Addendum)
Physician Discharge Summary Note  Patient:  Bryan [name] is an 42 y.o., male  MRN:  [id number]  DOB:  1976/02/10  Patient phone:  [phone] (home)   Patient address:   [address] [place] [address],  Total Time spent with patient: Greater than 30 minutes  Date of Admission:  07/06/2017  Date of Discharge: 07-17-17  Reason for Admission: Poor hygiene, agitation episodes of destroying his home/punching holes in walls, and poor medication adherence.  Principal Problem: Undifferentiated schizophrenia [facility])  Discharge Diagnoses: Patient Active Problem List   Diagnosis Date Noted  . Undifferentiated schizophrenia (HCC) [F20.3]     Priority: High  . 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 Illiterate [Z55.0] 10/30/2011  . Schizoaffective disorder, depressive type (HCC) [F25.1] 10/27/2011  . Schizoaffective disorder (HCC) [F25.9] 08/10/2011  . Observed seizure-like activity (HCC) [R56.9] 08/09/2011  . PAIN IN JOINT, MULTIPLE SITES [M25.50] 02/16/2010  . FATIGUE [R53.81, R53.83] 02/16/2010  . Shortness of breath [R06.02] 02/16/2010   Past Psychiatric History: Hx. Schizoaffective disorder, Schizoaffective disorder.  Past Medical History:  Past Medical History:  Diagnosis Date  . Hypertension   . Psychiatric problem    Seen at [facility] for unknown reason. Takes seroquel. History of hearing voices.  Not commandivng voices.   . Schizophrenia (HCC)   . Tremors of nervous system     Past Surgical History:  Procedure Laterality Date  . none     Family History: History reviewed. No pertinent family history.  Family Psychiatric  History: See H&P  Social History:  Social History   Substance and Sexual Activity  Alcohol Use Yes   Comment: says drinks a 40 at least once a week. denies drink in 2-3 weeks.      Social History   Substance and Sexual Activity  Drug Use Yes  . Types: Cocaine, Marijuana   Comment: marijuana somewhat regularly    Social History    Socioeconomic History  . Marital status: Married    Spouse name: Not on file  . Number of children: Not on file  . Years of education: Not on file  . Highest education level: Not on file  Occupational History  . Not on file  Social Needs  . Financial resource strain: Not on file  . Food insecurity:    Worry: Not on file    Inability: Not on file  . Transportation needs:    Medical: Not on file    Non-medical: Not on file  Tobacco Use  . Smoking status: Current Every Day Smoker    Packs/day: 0.50    Types: Cigarettes  . Smokeless tobacco: Never Used  Substance and Sexual Activity  . Alcohol use: Yes    Comment: says drinks a 40 at least once a week. denies drink in 2-3 weeks.   . Drug use: Yes    Types: Cocaine, Marijuana    Comment: marijuana somewhat regularly  . Sexual activity: Never  Lifestyle  . Physical activity:    Days per week: Not on file    Minutes per session: Not on file  . Stress: Not on file  Relationships  . Social connections:    Talks on phone: Not on file    Gets together: Not on file    Attends religious service: Not on file    Active member of club or organization: Not on file    Attends meetings of clubs or organizations: Not on file    Relationship status: Not on file  Other Topics Concern  . Not on file  Social History Narrative   LIves in [place] in an apartment with his brother. Unclear why on medicaid.   Hospital Course: (Per Md's discharge SRA): Bryan [name] is a 42 y/o M with history of schizophrenia admitted on IVC placed by family due to not taking care of ADL's, agitation episodes of destroying his home/punching holes in walls, and poor medication adherence. Pt was disorganized, flat, and thought blocking on initial presentation. Pt has also been reporting new-onset of myoclonic jerks which have continued during his hospitalization, but they appear to occur mostly when interacting with treatment team members suggesting they may  be induced by anxiety rather than a neurologic cause. He was started on [place] which he tolerated without difficulty, and he was transitioned to long-acting injectable form of Bryan [name] which he received first dose on 5/13, and we are anticipating booster injection to be given ontoday,5/16. Pt has been reporting improvement of his presenting symptoms.  Besides the use of Q monthly Invega Sustenna 156/ML IM for mood control, Bryan [name] also was medicated & discharged on; Invega tablets 6 mg for mood control, gabapentin 200 mg for agitation, Cogentin 1 mg for EPS & trazodone 50 mg for insomnia. He was enrolled & participated in the group counseling sessions being offered & held on this unit. He learned coping skills. He presented no other medical issues that required treatments. He tolerated his treatment regimen without any adverse effects or reactions reported.  Today upon evaluation,pt shares, "I'm good. I'm just ready to go." Pt denies any specific concerns. He is sleeping well. His appetite is good. He denies physical complaints. He denies SI/HI/AH/VH. He is tolerating his medication well, and he reports it has been helpful for him, stating, "It helps me to relax better and think better." He is in agreement to continue his current treatment regimen without changes. He is in agreement to have follow up with ACT team, and he plans to return to living at his apartment with his family checking in on him as well. He was able to engage in safety planning including plan to return to [facility] or contact emergency services if he feels unable to maintain his own safety or the safety of others. Pt had no further questions, comments, or concerns.  Upon discharge, Bryan [name] presented both mentally & medically stable. He is to continue mental health care & medication management on outpatient basis as noted below. He was provided with all the necessary information needed to make this appointment without problems. He  left [facility] with all personal belongings in no apparent distress. Transportation per mother.   Physical Findings: AIMS: Facial and Oral Movements Muscles of Facial Expression: None, normal Lips and Perioral Area: None, normal Jaw: None, normal Tongue: None, normal,Extremity Movements Upper (arms, wrists, hands, fingers): None, normal Lower (legs, knees, ankles, toes): None, normal, Trunk Movements Neck, shoulders, hips: None, normal, Overall Severity Severity of abnormal movements (highest score from questions above): None, normal Incapacitation due to abnormal movements: None, normal Patient's awareness of abnormal movements (rate only patient's report): No Awareness, Dental Status Current problems with teeth and/or dentures?: No Does patient usually wear dentures?: No  CIWA:  CIWA-Ar Total: 1 COWS:  COWS Total Score: 2  Musculoskeletal: Strength & Muscle Tone: within normal limits Gait & Station: normal Patient leans: N/A  Psychiatric Specialty Exam: Physical Exam  Constitutional: He appears well-developed.  HENT:  Head: Normocephalic.  Eyes: Pupils are equal, round, and reactive to light.  Neck: Normal range of motion.  Cardiovascular: Normal rate.  Respiratory: Effort normal.  GI: Soft.  Genitourinary:  Genitourinary Comments: Deferred  Musculoskeletal: Normal range of motion.  Neurological: He is alert.  Skin: Skin is warm.    Review of Systems  Constitutional: Negative.   HENT: Negative.   Eyes: Negative.   Respiratory: Negative.   Cardiovascular: Negative.   Gastrointestinal: Negative.   Genitourinary: Negative.   Skin: Negative.   Neurological: Negative.   Endo/Heme/Allergies: Negative.   Psychiatric/Behavioral: Positive for depression (Stable) and hallucinations (Hx. Psychosis (Stabilized with medications prior to discharge)). Negative for memory loss, substance abuse and suicidal ideas. The patient has insomnia (Syabilized with medication prior to  discharge). The patient is not nervous/anxious.     Blood pressure 100/76, pulse (!) 112, temperature 98.5 F (36.9 C), temperature source Oral, resp. rate 20, height 5' 9.5" (1.765 m), weight 70.3 kg (155 lb).Body mass index is 22.56 kg/m.  See Md's SRA   Have you used any form of tobacco in the last 30 days? (Cigarettes, Smokeless Tobacco, Cigars, and/or Pipes): Yes  Has this patient used any form of tobacco in the last 30 days? (Cigarettes, Smokeless Tobacco, Cigars, and/or Pipes): N/A  Blood Alcohol level:  Lab Results  Component Value Date   ETH <10 07/05/2017   ETH <10 02/16/2017   Metabolic Disorder Labs:  No results found for: HGBA1C, MPG No results found for: PROLACTIN No results found for: CHOL, TRIG, HDL, CHOLHDL, VLDL, LDLCALC  See Psychiatric Specialty Exam and Suicide Risk Assessment completed by Attending Physician prior to discharge.  Discharge destination:  Home  Is patient on multiple antipsychotic therapies at discharge:  No   Has Patient had three or more failed trials of antipsychotic monotherapy by history:  No  Recommended Plan for Multiple Antipsychotic Therapies: NA  Allergies as of 07/17/2017      Reactions   Fluphenazine Shortness Of Breath, Swelling   Tongue swelling, and shaking   Coffee Bean Extract [coffea Arabica] Swelling      Medication List    STOP taking these medications   diphenhydrAMINE 25 MG tablet Commonly known as:  BENADRYL   fluPHENAZine 10 MG tablet Commonly known as:  PROLIXIN   OLANZapine 5 MG tablet Commonly known as:  ZYPREXA     TAKE these medications     Indication  benztropine 1 MG tablet Commonly known as:  COGENTIN Take 1 tablet (1 mg total) by mouth 2 (two) times daily. For prevention of drug induced tremors What changed:  additional instructions  Indication:  Extrapyramidal Reaction caused by Medications   gabapentin 100 MG capsule Commonly known as:  NEURONTIN Take 2 capsules (200 mg total) by mouth 2  (two) times daily. For agitation  Indication:  Agitation   paliperidone 156 MG/ML Susy injection Commonly known as:  INVEGA SUSTENNA Inject 1 mL (156 mg total) into the muscle every 30 (thirty) days. (Due on 08-10-17): For mood control Start taking on:  08/10/2017  Indication:  Schizoaffective Disorder, Schizophrenia   paliperidone 6 MG 24 hr tablet Commonly known as:  INVEGA Take 1 tablet (6 mg total) by mouth daily. For mood control Start taking on:  07/18/2017  Indication:  Schizoaffective Disorder, Schizophrenia   traZODone 50 MG tablet Commonly known as:  DESYREL Take 1 tablet (50 mg total) by mouth at bedtime as needed for sleep.  Indication:  Trouble Sleeping      Follow-up Information    [facility] Follow up on 07/22/2017.  Why:  Monday at 1:00 for an assessment for services.  They will come to your home to meet with you.  Please call them to confirm that you can see them at that time. Contact information: [address] [place] [address] [phone]        [facility] Follow up on 07/19/2017.   Specialty:  Behavioral Health Why:  Friday at 1:10 with Merlyn Albert for your hospital follow up appointment.  Please bring along your hospital d/c paperwork. Also, the hospital transitional care team is available to help you with transportation,and other needs. Vikki Ports [address] information: [address] [place] [address] [phone]          Follow-up recommendations:  Activity:  As tolerated Diet: As recommended by your primary care doctor. Keep all scheduled follow-up appointments as recommended.  Comments: Patient is instructed prior to discharge to: Take all medications as prescribed by his/her mental healthcare provider. Report any adverse effects and or reactions from the medicines to his/her outpatient provider promptly. Patient has been instructed & cautioned: To not engage in alcohol and or illegal drug use while on  prescription medicines. In the event of worsening symptoms, patient is instructed to call the crisis hotline, 911 and or go to the nearest ED for appropriate evaluation and treatment of symptoms. To follow-up with his/her primary care provider for your other medical issues, concerns and or health care needs.   Signed: Armandina Stammer, NP, PMHNP, FNP-BC 07/17/2017, 10:33 AM   Patient seen, Suicide Assessment Completed.  Disposition Plan Reviewed

## 2017-07-17 NOTE — Progress Notes (Signed)
  [facility] Adult Case Management Discharge Plan :  Will you be returning to the same living situation after discharge:  Yes,  home At discharge, do you have transportation home?: Yes,  mother Do you have the ability to pay for your medications: Yes,  MCD  Release of information consent forms completed and in the chart;  Patient's signature needed at discharge.  Patient to Follow up at: Follow-up Information    [facility] Follow up on 07/22/2017.   Why:  Monday at 1:00 for an assessment for services.  They will come to your home to meet with you.  Please call them to confirm that you can see them at that time. Contact information: [address] [place] [address] [phone]        [facility] Follow up on 07/19/2017.   Specialty:  Behavioral Health Why:  Friday at 1:10 with Merlyn Albert for your hospital follow up appointment.  Please bring along your hospital d/c paperwork. Also, the hospital transitional care team is available to help you with transportation,and other needs. Valerie [phone] Contact information: [address] [place] [address] [phone]           Next level of care provider has access to [facility] Link:no  Safety Planning and Suicide Prevention discussed: Yes,  yes  Have you used any form of tobacco in the last 30 days? (Cigarettes, Smokeless Tobacco, Cigars, and/or Pipes): Yes  Has patient been referred to the Quitline?: Patient refused referral  Patient has been referred for addiction treatment: N/A  Ida Rogue, LCSW 07/17/2017, 12:02 PM

## 2017-08-20 ENCOUNTER — Other Ambulatory Visit: Payer: Self-pay

## 2017-08-20 ENCOUNTER — Emergency Department (HOSPITAL_COMMUNITY)
Admission: EM | Admit: 2017-08-20 | Discharge: 2017-08-21 | Disposition: A | Discharge status: Home / Self Care | Payer: Medicaid Other | Attending: Emergency Medicine | Admitting: Emergency Medicine

## 2017-08-20 ENCOUNTER — Encounter (HOSPITAL_COMMUNITY): Payer: Self-pay | Admitting: Emergency Medicine

## 2017-08-20 ENCOUNTER — Emergency Department (HOSPITAL_COMMUNITY): Payer: Medicaid Other

## 2017-08-20 DIAGNOSIS — F17210 Nicotine dependence, cigarettes, uncomplicated: Secondary | ICD-10-CM | POA: Diagnosis not present

## 2017-08-20 DIAGNOSIS — F1410 Cocaine abuse, uncomplicated: Secondary | ICD-10-CM | POA: Diagnosis not present

## 2017-08-20 DIAGNOSIS — R1032 Left lower quadrant pain: Secondary | ICD-10-CM | POA: Diagnosis present

## 2017-08-20 DIAGNOSIS — Z79899 Other long term (current) drug therapy: Secondary | ICD-10-CM | POA: Diagnosis not present

## 2017-08-20 DIAGNOSIS — N5082 Scrotal pain: Secondary | ICD-10-CM

## 2017-08-20 DIAGNOSIS — N433 Hydrocele, unspecified: Secondary | ICD-10-CM

## 2017-08-20 DIAGNOSIS — F1210 Cannabis abuse, uncomplicated: Secondary | ICD-10-CM | POA: Diagnosis not present

## 2017-08-20 DIAGNOSIS — I10 Essential (primary) hypertension: Secondary | ICD-10-CM | POA: Diagnosis not present

## 2017-08-20 LAB — CBC
HEMATOCRIT: 40.1 % (ref 39.0–52.0)
Hemoglobin: 13.2 g/dL (ref 13.0–17.0)
MCH: 30.8 pg (ref 26.0–34.0)
MCHC: 32.9 g/dL (ref 30.0–36.0)
MCV: 93.5 fL (ref 78.0–100.0)
Platelets: 309 10*3/uL (ref 150–400)
RBC: 4.29 MIL/uL (ref 4.22–5.81)
RDW: 15.1 % (ref 11.5–15.5)
WBC: 7.7 10*3/uL (ref 4.0–10.5)

## 2017-08-20 LAB — COMPREHENSIVE METABOLIC PANEL
ALT: 22 U/L (ref 0–44)
AST: 22 U/L (ref 15–41)
Albumin: 4.2 g/dL (ref 3.5–5.0)
Alkaline Phosphatase: 74 U/L (ref 38–126)
Anion gap: 10 (ref 5–15)
BILIRUBIN TOTAL: 0.8 mg/dL (ref 0.3–1.2)
BUN: 9 mg/dL (ref 6–20)
CO2: 25 mmol/L (ref 22–32)
Calcium: 9.8 mg/dL (ref 8.9–10.3)
Chloride: 103 mmol/L (ref 98–111)
Creatinine, Ser: 0.86 mg/dL (ref 0.61–1.24)
Glucose, Bld: 107 mg/dL — ABNORMAL HIGH (ref 70–99)
POTASSIUM: 3.8 mmol/L (ref 3.5–5.1)
Sodium: 138 mmol/L (ref 135–145)
TOTAL PROTEIN: 7 g/dL (ref 6.5–8.1)

## 2017-08-20 LAB — URINALYSIS, ROUTINE W REFLEX MICROSCOPIC
Bilirubin Urine: NEGATIVE
Glucose, UA: NEGATIVE mg/dL
Hgb urine dipstick: NEGATIVE
KETONES UR: 5 mg/dL — AB
LEUKOCYTES UA: NEGATIVE
NITRITE: NEGATIVE
PH: 5 (ref 5.0–8.0)
Protein, ur: NEGATIVE mg/dL
Specific Gravity, Urine: 1.025 (ref 1.005–1.030)

## 2017-08-20 LAB — LIPASE, BLOOD: Lipase: 37 U/L (ref 11–51)

## 2017-08-20 NOTE — ED Provider Notes (Signed)
Patient placed in Quick Look pathway, seen and evaluated   Chief Complaint: Left sided groin swelling   HPI:   Intermittent left sided groin swelling for multiple months, This episode started about 20 minutes PTA.  There is currently painful swelling. He thinks it is from eating pepperoni off the pizza.  He reports that when he eats and drinks it gets larger and when he pees it goes down.    ROS: No dysuria.   Physical Exam:   Gen: No distress  Neuro: Awake and Alert  Skin: Warm    Focused Exam: Abdomen is soft, non tender, non distended.  Groin exam deferred.    Initiation of care has begun. The patient has been counseled on the process, plan, and necessity for staying for the completion/evaluation, and the remainder of the medical screening examination    Norman ClayHammond, Damiean Lukes W, PA-C 08/20/17 1928    Terrilee FilesButler, Michael C, MD 08/21/17 1126

## 2017-08-20 NOTE — ED Triage Notes (Signed)
Pt states he has left sided groin swelling and pain that comes and goes.  Correlates it to when he drinks too much water.

## 2017-08-20 NOTE — ED Provider Notes (Signed)
[facility] Note   CSN: [id number] Arrival date & time: 08/20/17  1910     History   Chief Complaint Chief Complaint  Patient presents with  . Groin Pain    HPI Rosita Kearthur Gilkes is a 42 y.o. male.  42 year old male with a history of hypertension and schizophrenia presents to the emergency department for groin swelling.  Patient has had symptoms intermittently for approximately 1 year, per mother.  She states that he just made her aware of the symptoms today and she prompted him to be evaluated.  Patient reports left-sided groin swelling which worsens when he eats and drinks certain foods.  He does not notice any correlation of the swelling to being upright or increased strenuous activity such as lifting.  He denies taking any medications for his symptoms.  He describes his pain as a soreness which is nonradiating.  He has not had any scrotal swelling, testicular tenderness, dysuria, hematuria, difficulty voiding, fevers.  He feels that the swelling subjectively besides post void.  No history of abdominal surgeries.  The history is provided by the patient. No language interpreter was used.  Groin Pain     Past Medical History:  Diagnosis Date  . Hypertension   . Psychiatric problem    [facility] Takes seroquel. History of hearing voices.  Not commandivng voices.   . Schizophrenia (HCC)   . Tremors of nervous system     Patient Active Problem List   Diagnosis Date Noted  . Undifferentiated schizophrenia (HCC)   . 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 Illiterate 10/30/2011  . Schizoaffective disorder, depressive type (HCC) 10/27/2011  . Schizoaffective disorder (HCC) 08/10/2011  . Observed seizure-like activity (HCC) 08/09/2011  . PAIN IN JOINT, MULTIPLE SITES 02/16/2010  . FATIGUE 02/16/2010  . Shortness of breath 02/16/2010    Past Surgical History:  Procedure Laterality Date  . none          Home  Medications    Prior to Admission medications   Medication Sig Start Date End Date Taking? Authorizing Provider  benztropine (COGENTIN) 1 MG tablet Take 1 tablet (1 mg total) by mouth 2 (two) times daily. For prevention of drug induced tremors 07/17/17   Armandina StammerNwoko, Agnes I, NP  gabapentin (NEURONTIN) 100 MG capsule Take 2 capsules (200 mg total) by mouth 2 (two) times daily. For agitation 07/17/17   Armandina StammerNwoko, Agnes I, NP  paliperidone (INVEGA SUSTENNA) 156 MG/ML SUSY injection Inject 1 mL (156 mg total) into the muscle every 30 (thirty) days. (Due on 08-10-17): For mood control 08/10/17   Armandina StammerNwoko, Agnes I, NP  paliperidone (INVEGA) 6 MG 24 hr tablet Take 1 tablet (6 mg total) by mouth daily. For mood control 07/18/17   Armandina StammerNwoko, Agnes I, NP  traZODone (DESYREL) 50 MG tablet Take 1 tablet (50 mg total) by mouth at bedtime as needed for sleep. 07/17/17   Sanjuana KavaNwoko, Agnes I, NP    Family History No family history on file.  Social History Social History   Tobacco Use  . Smoking status: Current Every Day Smoker    Packs/day: 0.50    Types: Cigarettes  . Smokeless tobacco: Never Used  Substance Use Topics  . Alcohol use: Yes    Comment: says drinks a 40 at least once a week. denies drink in 2-3 weeks.   . Drug use: Yes    Types: Cocaine, Marijuana    Comment: marijuana somewhat regularly  Allergies   Fluphenazine and Coffee bean extract [coffea arabica]   Review of Systems Review of Systems Ten systems reviewed and are negative for acute change, except as noted in the HPI.    Physical Exam Updated Vital Signs BP 111/77   Pulse 66   Temp 97.9 F (36.6 C) (Oral)   Resp 16   Ht 5\' 8"  (1.727 m)   Wt 81.6 kg (180 lb)   SpO2 97%   BMI 27.37 kg/m   Physical Exam  Constitutional: He is oriented to person, place, and time. He appears well-developed and well-nourished. No distress.  Calm, nontoxic and in NAD  HENT:  Head: Normocephalic and atraumatic.  Eyes: Conjunctivae and EOM are normal.  No scleral icterus.  Neck: Normal range of motion.  Cardiovascular: Normal rate, regular rhythm and intact distal pulses.  Pulmonary/Chest: Effort normal. No respiratory distress.  Respirations even and unlabored  Abdominal: A hernia is present. Hernia confirmed positive in the left inguinal area.  Genitourinary: Right testis shows no swelling and no tenderness. Right testis is descended. Left testis shows no swelling and no tenderness. Left testis is descended. Circumcised.     Genitourinary Comments: Exam chaperoned by RN, Perpetue.  Musculoskeletal: Normal range of motion.  Neurological: He is alert and oriented to person, place, and time. He exhibits normal muscle tone. Coordination normal.  GCS 15. Moving all extremities.  Skin: Skin is warm and dry. No rash noted. He is not diaphoretic. No erythema. No pallor.  Psychiatric: He has a normal mood and affect. His behavior is normal.  Nursing note and vitals reviewed.    ED Treatments / Results  Labs (all labs ordered are listed, but only abnormal results are displayed) Labs Reviewed  COMPREHENSIVE METABOLIC PANEL - Abnormal; Notable for the following components:      Result Value   Glucose, Bld 107 (*)    All other components within normal limits  URINALYSIS, ROUTINE W REFLEX MICROSCOPIC - Abnormal; Notable for the following components:   Ketones, ur 5 (*)    All other components within normal limits  LIPASE, BLOOD  CBC    EKG None  Radiology US Scrotum W/doppler  Result Date: 08/20/2017 CLINICAL DATA:  Intermittent left-sided groin pain and swelling for months. EXAM: SCROTAL ULTRASOUND DOPPLER ULTRASOUND OF THE TESTICLES TECHNIQUE: Complete ultrasound examination of the testicles, epididymis, and other scrotal structures was performed. Color and spectral Doppler ultrasound were also utilized to evaluate blood flow to the testicles. COMPARISON:  None. FINDINGS: Right testicle Measurements: 5.8 x 2.7 x 2.7 cm. No mass or  microlithiasis visualized. Left testicle Measurements: 5.5 x 2.5 x 3 cm. No mass or microlithiasis visualized. Right epididymis:  Normal in size and appearance. Left epididymis:  Normal in size and appearance. Hydrocele:  Small left hydrocele. Varicocele:  None visualized. Pulsed Doppler interrogation of both testes demonstrates normal low resistance arterial and venous waveforms bilaterally. IMPRESSION: No intratesticular mass or torsion noted.  Small left hydrocele. Electronically Signed   By: Tollie Eth M.D.   On: 08/20/2017 23:32    Procedures Procedures (including critical care time)  Medications Ordered in ED Medications - No data to display   Initial Impression / Assessment and Plan / ED Course  I have reviewed the triage vital signs and the nursing notes.  Pertinent labs & imaging results that were available during my care of the patient were reviewed by me and considered in my medical decision making (see chart for details).  42 year old male presents to the emergency department for evaluation of left-sided groin swelling.  This has been persistent for approximately 1 year.  On exam, the patient does have a small bulge in his left groin.  This subsides with palpation.  He has no scrotal swelling or tenderness on exam. Palpation of the left and right inguinal canal are largely unremarkable; GU exam chaperoned by RN.  The patient has been found to have a reassuring laboratory work-up.  His urinalysis is without evidence of infection or pyuria.  Scrotal ultrasound reveals a small left hydrocele without other acute process.  Given transient nature of symptoms, as well as chronicity, I believe symptoms can be further evaluated on an outpatient basis.  Urology referral provided for hydrocele in addition to general surgery referral should concern for hernia persist.  Return precautions discussed and provided. Patient discharged in stable condition with no unaddressed concerns.   Final  Clinical Impressions(s) / ED Diagnoses   Final diagnoses:  Left hydrocele  Left inguinal pain    ED Discharge Orders    None       Antony Madura, PA-C 08/21/17 0030    Linwood Dibbles, MD 08/22/17 1425

## 2017-08-21 NOTE — Discharge Instructions (Signed)
We recommend Tylenol or ibuprofen for any persistent discomfort.  Avoid strenuous activity and heavy lifting.  Your ultrasound revealed a hydrocele in your left scrotum. This is small in size and would benefit from scrotal elevation and support. Follow up with Urology if desired. You are also believed to have a hernia in your left groin. For further evaluation of this, make an appointment with a primary care doctor. You may follow up with a general surgeon for further evaluation, if desired, as well. Return for any new or concerning symptoms.

## 2017-08-21 NOTE — ED Notes (Signed)
Discharge instructions/follow up care reviewed with patient, pt denied any further requests.

## 2018-03-11 ENCOUNTER — Encounter (HOSPITAL_COMMUNITY): Payer: Self-pay | Admitting: Emergency Medicine

## 2018-03-11 ENCOUNTER — Emergency Department (HOSPITAL_COMMUNITY)
Admission: EM | Admit: 2018-03-11 | Discharge: 2018-03-11 | Disposition: A | Discharge status: Other Acute Inpatient Hospital | Payer: Medicaid Other | Attending: Emergency Medicine | Admitting: Emergency Medicine

## 2018-03-11 ENCOUNTER — Inpatient Hospital Stay (HOSPITAL_COMMUNITY)
Admission: AD | Admit: 2018-03-11 | Discharge: 2018-03-18 | DRG: 885 | Disposition: A | Discharge status: Home / Self Care | Payer: Medicaid Other | Source: Intra-hospital | Attending: Psychiatry | Admitting: Psychiatry

## 2018-03-11 DIAGNOSIS — F200 Paranoid schizophrenia: Secondary | ICD-10-CM | POA: Diagnosis present

## 2018-03-11 DIAGNOSIS — F203 Undifferentiated schizophrenia: Secondary | ICD-10-CM

## 2018-03-11 DIAGNOSIS — Z79899 Other long term (current) drug therapy: Secondary | ICD-10-CM

## 2018-03-11 DIAGNOSIS — F333 Major depressive disorder, recurrent, severe with psychotic symptoms: Principal | ICD-10-CM | POA: Diagnosis present

## 2018-03-11 DIAGNOSIS — I10 Essential (primary) hypertension: Secondary | ICD-10-CM | POA: Diagnosis not present

## 2018-03-11 DIAGNOSIS — F17210 Nicotine dependence, cigarettes, uncomplicated: Secondary | ICD-10-CM | POA: Insufficient documentation

## 2018-03-11 DIAGNOSIS — F1490 Cocaine use, unspecified, uncomplicated: Secondary | ICD-10-CM | POA: Diagnosis present

## 2018-03-11 DIAGNOSIS — F1210 Cannabis abuse, uncomplicated: Secondary | ICD-10-CM | POA: Diagnosis present

## 2018-03-11 DIAGNOSIS — Z9114 Patient's other noncompliance with medication regimen: Secondary | ICD-10-CM | POA: Diagnosis not present

## 2018-03-11 DIAGNOSIS — F209 Schizophrenia, unspecified: Secondary | ICD-10-CM | POA: Insufficient documentation

## 2018-03-11 DIAGNOSIS — G4700 Insomnia, unspecified: Secondary | ICD-10-CM | POA: Diagnosis not present

## 2018-03-11 LAB — CBC WITH DIFFERENTIAL/PLATELET
Abs Immature Granulocytes: 0.02 10*3/uL (ref 0.00–0.07)
BASOS PCT: 0 %
Basophils Absolute: 0 10*3/uL (ref 0.0–0.1)
EOS ABS: 0 10*3/uL (ref 0.0–0.5)
EOS PCT: 0 %
HEMATOCRIT: 39.4 % (ref 39.0–52.0)
Hemoglobin: 13.6 g/dL (ref 13.0–17.0)
IMMATURE GRANULOCYTES: 0 %
LYMPHS ABS: 1.1 10*3/uL (ref 0.7–4.0)
Lymphocytes Relative: 18 %
MCH: 30.5 pg (ref 26.0–34.0)
MCHC: 34.5 g/dL (ref 30.0–36.0)
MCV: 88.3 fL (ref 80.0–100.0)
MONO ABS: 0.5 10*3/uL (ref 0.1–1.0)
MONOS PCT: 8 %
Neutro Abs: 4.8 10*3/uL (ref 1.7–7.7)
Neutrophils Relative %: 74 %
Platelets: 270 10*3/uL (ref 150–400)
RBC: 4.46 MIL/uL (ref 4.22–5.81)
RDW: 14.2 % (ref 11.5–15.5)
WBC: 6.4 10*3/uL (ref 4.0–10.5)
nRBC: 0 % (ref 0.0–0.2)

## 2018-03-11 LAB — ETHANOL: Alcohol, Ethyl (B): <10 mg/dL

## 2018-03-11 LAB — COMPREHENSIVE METABOLIC PANEL
ALBUMIN: 4.9 g/dL (ref 3.5–5.0)
ALK PHOS: 59 U/L (ref 38–126)
ALT: 15 U/L (ref 0–44)
AST: 35 U/L (ref 15–41)
Anion gap: 15 (ref 5–15)
BILIRUBIN TOTAL: 2 mg/dL — AB (ref 0.3–1.2)
BUN: 21 mg/dL — AB (ref 6–20)
CALCIUM: 9.8 mg/dL (ref 8.9–10.3)
CO2: 24 mmol/L (ref 22–32)
CREATININE: 1.2 mg/dL (ref 0.61–1.24)
Chloride: 95 mmol/L — ABNORMAL LOW (ref 98–111)
GFR calc Af Amer: >60 mL/min (ref >60)
GLUCOSE: 139 mg/dL — AB (ref 70–99)
Potassium: 3.2 mmol/L — ABNORMAL LOW (ref 3.5–5.1)
Sodium: 134 mmol/L — ABNORMAL LOW (ref 135–145)
TOTAL PROTEIN: 7.5 g/dL (ref 6.5–8.1)

## 2018-03-11 LAB — ACETAMINOPHEN LEVEL: Acetaminophen (Tylenol), Serum: <10 ug/mL — ABNORMAL LOW (ref 10–30)

## 2018-03-11 LAB — SALICYLATE LEVEL: Salicylate Lvl: <7 mg/dL (ref 2.8–30.0)

## 2018-03-11 MED ORDER — POTASSIUM CHLORIDE CRYS ER 20 MEQ PO TBCR
40.0000 meq | EXTENDED_RELEASE_TABLET | Freq: Once | ORAL | Status: AC
  Administered 2018-03-11: 40 meq via ORAL
  Filled 2018-03-11: qty 2

## 2018-03-11 NOTE — Progress Notes (Signed)
Received Bryan [name] at the change of shift, he is sitting on the side of the bed talking to hisself. He is clam cooperative with poor eye contact He consented to talked  with telepsych. This patient was transported to [facility], report was called and the police. Confirmed $110.00  was transported with him in his shoe. His personal belongings were returned. He was transported without incident at 1140 hrs.

## 2018-03-11 NOTE — BH Assessment (Signed)
Tele Assessment Note   Patient Name: Bryan [name] MRN: [id number] Referring Physician: Dr. Erma HeritageIsaacs Location of Patient: [facility]  Bryan [name] is an 43 y.o., single male. [facility] by his mother [phone]). Per RN Note: Pt brought in by [employer] under IVC papers that state: "[name] has been previously diagnosed with schizophrenia. He has been prescribed medication for treatment but is currently non-compliant with his medication regimen. He has history of mental commitments, as recently as last year for similar reasons. [name] is acting erratically and not wanting to interact with family and friends. [name]'s family states that he has been off his meds for over month now and he is regressing. Becoming more and more solitary, he is not eating or bathing or sleeping. Family is concerned for his safety as he continues to regress."  Pt presents with AH, due to audibly responding to internal stimuli. Pt denied. Pt reports feeling "exhausted." Pt denied other depression symptoms. [name] reports pt has not been bathing or sleeping. Pt reports 5 hours of sleep nightly. Pt reports being a MM patient at [facility]. Pt reports that he cannot remember the name of his medication, but stated that it is a pill. Pt reports not taking his medication for "a few days."   Pt reports living alone. Pt reports being single and having no children. Pt reports receiving SSI. Pt reports that his mother is supportive. Pt denied legal involvement and probation. Pt reports no major medical issues.   Pt oriented to person ONLY. Pt presented alert, dressed appropriately and somewhat groomed. Pt spoke clearly, but incoherently at times. Pt did not seem to be under the influence of any substances. Pt made no eye contact and answered questions sporadically. Pt answered questions only pertaining to one subject, which was being tired. Pt was verbally  responding to some stimuli and stretching saying his back was tense. Pt appeared to be trying to keep something off of him. Pt presented with flat affect, butcalm and open to the assessment process. Pt presented with impairments of remote or recent memory.   Diagnosis: F20.9 Schizophrenia   Past Medical History:  Past Medical History:  Diagnosis Date  . Hypertension   . Psychiatric problem    [facility]. Takes seroquel. History of hearing voices.  Not commandivng voices.   . Schizophrenia (HCC)   . Tremors of nervous system     Past Surgical History:  Procedure Laterality Date  . none      Family History: No family history on file.  Social History:  reports that he has been smoking cigarettes. He has been smoking about 0.50 packs per day. He has never used smokeless tobacco. He reports current alcohol use. He reports current drug use. Drugs: Cocaine and Marijuana.  Additional Social History:  Alcohol / Drug Use Pain Medications: SEE MAR.  Prescriptions: Pt reports being prescribed MH medications, but stated that he cannot remember the names.  Over the Counter: SEE MAR History of alcohol / drug use?: No history of alcohol / drug abuse  CIWA: CIWA-Ar BP: 110/76 Pulse Rate: (!) 103 COWS:    Allergies:  Allergies  Allergen Reactions  . Fluphenazine Shortness Of Breath and Swelling    Tongue swelling, and shaking  . Coffee Bean Extract [Coffea Arabica] Swelling    Home Medications: (Not in a hospital admission)   OB/GYN Status:  No LMP for male patient.  General Assessment Data  [facility] of Assessment: [facility] TTS Assessment: In system Is this a Tele or Face-to-Face Assessment?: Tele Assessment Is this an Initial Assessment or a Re-assessment for this encounter?: Initial Assessment Patient Accompanied by:: N/A Language Other than English: No Living Arrangements: Other (Comment)(Pt reports living in his own apartment. ) What gender do you  identify as?: Male Marital status: Single Maiden name: N/A Pregnancy Status: No Living Arrangements: Alone Can pt return to current living arrangement?: Yes Admission Status: Involuntary Petitioner: Family member Is patient capable of signing voluntary admission?: Yes Referral Source: Self/Family/Friend Insurance type: Medicaid   Medical Screening Exam [facility] Walk-in ONLY) Medical Exam completed: Yes  Crisis Care Plan Living Arrangements: Alone Legal Guardian: Other:(Self) Name of Psychiatrist: [facility]  Name of Therapist: Denied   Education Status Is patient currently in school?: No Is the patient employed, unemployed or receiving disability?: Receiving disability income  Risk to self with the past 6 months Suicidal Ideation: No Has patient been a risk to self within the past 6 months prior to admission? : No Suicidal Intent: No Has patient had any suicidal intent within the past 6 months prior to admission? : No Is patient at risk for suicide?: No Suicidal Plan?: No Has patient had any suicidal plan within the past 6 months prior to admission? : No Access to Means: No What has been your use of drugs/alcohol within the last 12 months?: Denied Previous Attempts/Gestures: No How many times?: 0 Other Self Harm Risks: Denied Triggers for Past Attempts: None known Intentional Self Injurious Behavior: None Family Suicide History: No Recent stressful life event(s): Other (Comment)(None reported. ) Persecutory voices/beliefs?: No Depression: No Depression Symptoms: Fatigue Substance abuse history and/or treatment for substance abuse?: No Suicide prevention information given to non-admitted patients: Not applicable  Risk to Others within the past 6 months Homicidal Ideation: No Does patient have any lifetime risk of violence toward others beyond the six months prior to admission? : No Thoughts of Harm to Others: No Current Homicidal Intent: No Current Homicidal Plan:  No Access to Homicidal Means: No Identified Victim: Denied History of harm to others?: No Assessment of Violence: None Noted Violent Behavior Description: Denied Does patient have access to weapons?: No Criminal Charges Pending?: No Does patient have a court date: No Is patient on probation?: No  Psychosis Hallucinations: Auditory(Pt responding verbally to internal stimuli. ) Delusions: None noted  Mental Status Report Appearance/Hygiene: Unremarkable, In scrubs Eye Contact: Poor Motor Activity: Freedom of movement Speech: Incoherent Level of Consciousness: Alert Mood: Preoccupied Affect: Flat Anxiety Level: None Thought Processes: Coherent, Relevant Judgement: Impaired Orientation: Person Obsessive Compulsive Thoughts/Behaviors: None  Cognitive Functioning Concentration: Decreased Memory: Recent Impaired, Remote Impaired Is patient IDD: No Insight: Poor Impulse Control: Poor Appetite: Good Have you had any weight changes? : No Change Sleep: Decreased Total Hours of Sleep: 5 Vegetative Symptoms: None  ADLScreening [facility] Assessment Services) Patient's cognitive ability adequate to safely complete daily activities?: Yes Patient able to express need for assistance with ADLs?: Yes Independently performs ADLs?: Yes (appropriate for developmental age)  Prior Inpatient Therapy Prior Inpatient Therapy: Yes Prior Therapy Dates: 2013, 2019 Prior Therapy Facilty/Provider([facility] Reason for Treatment: Schizophrenia  Prior Outpatient Therapy Prior Outpatient Therapy: Yes Prior Therapy Dates: 2019 Prior Therapy Facilty/Provider(s): [facility]  Reason for Treatment: MM Does patient have an ACCT team?: No Does patient have Intensive In-House Services?  : No Does patient have [facility] services? : Yes Does patient have P4CC services?: No  ADL Screening (condition at time of  admission) Patient's cognitive ability adequate to safely complete daily activities?: Yes Is the  patient deaf or have difficulty hearing?: No Does the patient have difficulty seeing, even when wearing glasses/contacts?: No Does the patient have difficulty concentrating, remembering, or making decisions?: No Patient able to express need for assistance with ADLs?: Yes Does the patient have difficulty dressing or bathing?: No Independently performs ADLs?: Yes (appropriate for developmental age) Does the patient have difficulty walking or climbing stairs?: No Weakness of Legs: None Weakness of Arms/Hands: None  Home Assistive Devices/Equipment Home Assistive Devices/Equipment: None  Therapy Consults (therapy consults require a physician order) PT Evaluation Needed: No OT Evalulation Needed: No SLP Evaluation Needed: No Abuse/Neglect Assessment (Assessment to be complete while patient is alone) Abuse/Neglect Assessment Can Be Completed: Yes Physical Abuse: Denies Verbal Abuse: Denies Sexual Abuse: Denies Exploitation of patient/patient's resources: Denies Self-Neglect: Denies Values / Beliefs Cultural Requests During Hospitalization: None Spiritual Requests During Hospitalization: None Consults Spiritual Care Consult Needed: No Social Work Consult Needed: No Merchant navy officerAdvance Directives (For Healthcare) Does Patient Have a Medical Advance Directive?: No Would patient like information on creating a medical advance directive?: No - Patient declined          Disposition: Per Donell SievertSpencer Simon, PA; Pt meets criteria for inpatient treatment. AC, Kim informed of pt disposition.  Disposition Initial Assessment Completed for this Encounter: Yes Patient referred to: [facility](AC, Kim informed of pt disposition. )  This service was provided via telemedicine using a 2-way, interactive audio and Immunologistvideo technology.  Names of all persons participating in this telemedicine service and their role in this encounter. Name: Bryan [name]  Role: Patient   Name: Chesley NoonMiriam Marlynn Hinckley  Role: Clinician   Name:  Role:    Name:  Role:     Chesley NoonMiriam Shakemia Madera, M.S., [facility], LCAS Triage Specialist [facility] 03/11/2018 8:25 PM

## 2018-03-11 NOTE — ED Notes (Signed)
Bed: [facility] Expected date:  Expected time:  Means of arrival:  Comments: 

## 2018-03-11 NOTE — ED Notes (Signed)
Patient visiting with his mother at bedside.

## 2018-03-11 NOTE — ED Notes (Signed)
Patient sitting on bed responding to internal stimuli with whispered conversations almost constantly.  Patient will stop occasionally to listen to a question, but then return to his internal conversation.  Difficult to assess due to his current mental state.  Asked patient if nurse could obtain blood samples.  Patient acted very unsure about this but eventually let nurse obtain the samples.  Urine cup at bedside and patient advised that a urine sample was needed.  Offered patient food and drink which he accepted.

## 2018-03-11 NOTE — ED Notes (Signed)
Patient still visiting with mother.  Patient's mother reports that patient is very soft spoken and has never been violent before.  Patient calm and cooperative.  Nurse was able to do EKG without any problem.

## 2018-03-11 NOTE — ED Notes (Signed)
Patient's mother said last time he was hospitalized, his discharge medication was too strong.  He takes invega and his mother feels like the injectable meds are best because he is non compliant with po medications. She thinks he needs a lower dose. Patient does not live with his parents and mother wishes he was closer to them so they could check on him often.  Patient has been sleeping too much, not answering his phone and not going out of the house according to his mother.  Patient signed consent to release information to his mother.  Her phone number is [phone].

## 2018-03-11 NOTE — Progress Notes (Signed)
Pt did not give verbal consent to speak to his mother. Clinician will not be contacting collateral, but did utilize the IVC paperwork.

## 2018-03-11 NOTE — Progress Notes (Signed)
Nurse Randa Evens informed of pt disposition.   [facility] Bed 508 Bed 1.  Call report to [phone].  Attending Dr. Jeannine Kitten Pt can be transported after 22:00.

## 2018-03-11 NOTE — ED Provider Notes (Signed)
[facility]-EMERGENCY DEPT Provider Note   CSN: [id number] Arrival date & time: 03/11/18  1502     History   Chief Complaint Chief Complaint  Patient presents with  . IVC    HPI Cartez Tritz is a 43 y.o. male with a past medical history of hypertension, schizophrenia, delusional disorder, fatigue, who presents today under IVC for evaluation.  History is obtained from IVC papers primarily as patient is a poor historian.  IVC papers state patient is not currently compliant with his medication regimen and has reportedly been eating erratically and not wanting to interact with family and friends.  His family is reportedly concerned for his safety.   Patient tells me he does not have any physical pain.  He tells me that for approximately 1 month he has felt tired and been going through a phase where he needs to rest stop.  He tells me that there may be something going on in his body but he is getting better now.  He is unable to provide further history.     Chart review shows patient was noted to be apparently responding to internal stimuli by RN.   HPI  Past Medical History:  Diagnosis Date  . Hypertension   . Psychiatric problem    Seen at [facility] for unknown reason. Takes seroquel. History of hearing voices.  Not commandivng voices.   . Schizophrenia (HCC)   . Tremors of nervous system     Patient Active Problem List   Diagnosis Date Noted  . Undifferentiated schizophrenia (HCC)   . 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 Illiterate 10/30/2011  . Schizoaffective disorder, depressive type (HCC) 10/27/2011  . Schizoaffective disorder (HCC) 08/10/2011  . Observed seizure-like activity (HCC) 08/09/2011  . PAIN IN JOINT, MULTIPLE SITES 02/16/2010  . FATIGUE 02/16/2010  . Shortness of breath 02/16/2010    Past Surgical History:  Procedure Laterality Date  . none          Home Medications    Prior to Admission medications   Medication Sig Start Date End  Date Taking? Authorizing Provider  benztropine (COGENTIN) 1 MG tablet Take 1 tablet (1 mg total) by mouth 2 (two) times daily. For prevention of drug induced tremors Patient not taking: Reported on 03/11/2018 07/17/17   Armandina Stammer I, NP  gabapentin (NEURONTIN) 100 MG capsule Take 2 capsules (200 mg total) by mouth 2 (two) times daily. For agitation Patient not taking: Reported on 03/11/2018 07/17/17   Armandina Stammer I, NP  paliperidone (INVEGA SUSTENNA) 156 MG/ML SUSY injection Inject 1 mL (156 mg total) into the muscle every 30 (thirty) days. (Due on 08-10-17): For mood control Patient not taking: Reported on 03/11/2018 08/10/17   Armandina Stammer I, NP  paliperidone (INVEGA) 6 MG 24 hr tablet Take 1 tablet (6 mg total) by mouth daily. For mood control Patient not taking: Reported on 03/11/2018 07/18/17   Armandina Stammer I, NP  traZODone (DESYREL) 50 MG tablet Take 1 tablet (50 mg total) by mouth at bedtime as needed for sleep. Patient not taking: Reported on 03/11/2018 07/17/17   Armandina Stammer I, NP    Family History No family history on file.  Social History Social History   Tobacco Use  . Smoking status: Current Every Day Smoker    Packs/day: 0.50    Types: Cigarettes  . Smokeless tobacco: Never Used  Substance Use Topics  . Alcohol use: Yes    Comment: says drinks a 40 at least once a  week. denies drink in 2-3 weeks.   . Drug use: Yes    Types: Cocaine, Marijuana    Comment: marijuana somewhat regularly     Allergies   Fluphenazine and Coffee bean extract [coffea arabica]   Review of Systems Review of Systems  Unable to perform ROS: Psychiatric disorder     Physical Exam Updated Vital Signs BP (!) 122/97 (BP Location: Left Arm)   Pulse 87   Temp 98 F (36.7 C) (Oral)   Resp 20   SpO2 99%   Physical Exam Vitals signs and nursing note reviewed.  Constitutional:      General: He is not in acute distress.    Appearance: He is well-developed. He is not diaphoretic.  HENT:      Head: Normocephalic and atraumatic.  Eyes:     General: No scleral icterus.       Right eye: No discharge.        Left eye: No discharge.     Conjunctiva/sclera: Conjunctivae normal.  Neck:     Musculoskeletal: Normal range of motion.  Cardiovascular:     Rate and Rhythm: Normal rate and regular rhythm.     Comments: Patient refused ascultation of heart, lungs.  Pulmonary:     Effort: Pulmonary effort is normal. No respiratory distress.  Abdominal:     General: There is no distension.  Musculoskeletal:        General: No deformity.  Skin:    General: Skin is warm and dry.  Neurological:     Mental Status: He is alert.     Motor: No abnormal muscle tone.     Comments: He is oriented to person, will not answer questions about place or time.  Psychiatric:        Attention and Perception: He is inattentive. He perceives auditory and visual hallucinations.        Mood and Affect: Affect is blunt and flat.        Speech: Speech is tangential.        Behavior: Behavior is withdrawn.        Cognition and Memory: Cognition is impaired.     Comments: Patient will not provide a coherent/logical history.  He will not answer some questions and instead replies with "I was going through a phase but I am getting better."      ED Treatments / Results  Labs (all labs ordered are listed, but only abnormal results are displayed) Labs Reviewed  COMPREHENSIVE METABOLIC PANEL - Abnormal; Notable for the following components:      Result Value   Sodium 134 (*)    Potassium 3.2 (*)    Chloride 95 (*)    Glucose, Bld 139 (*)    BUN 21 (*)    Total Bilirubin 2.0 (*)    All other components within normal limits  ACETAMINOPHEN LEVEL - Abnormal; Notable for the following components:   Acetaminophen (Tylenol), Serum <10 (*)    All other components within normal limits  ETHANOL  CBC WITH DIFFERENTIAL/PLATELET  SALICYLATE LEVEL  RAPID URINE DRUG SCREEN, HOSP PERFORMED     EKG None  Radiology No results found.  Procedures Procedures (including critical care time)  Medications Ordered in ED Medications - No data to display   Initial Impression / Assessment and Plan / ED Course  I have reviewed the triage vital signs and the nursing notes.  Pertinent labs & imaging results that were available during my care of the patient were reviewed  by me and considered in my medical decision making (see chart for details).    Patient presents today under IVC for evaluation of acting erratically and not taking his medications.  On exam he appears to be responding to internal stimuli, is quiet, and apprehensive.  He denies any physical concerns or complaints.  Screening labs are obtained.  Patient's potassium is slightly low at 3.2, p.o. repletion ordered.  He has not been taking his home medications, therefore they were not reordered.    His total bilirubin is slightly elevated, however chart review shows that he normally is slightly elevated.  He denies any abdominal pain, is not anemic.  Patient is medically clear for psychiatric disposition.   Final Clinical Impressions(s) / ED Diagnoses   Final diagnoses:  Undifferentiated schizophrenia [facility](HCC)    ED Discharge Orders    None       Norman ClayHammond, Maelie Chriswell W, PA-C 03/11/18 2228    Shaune PollackIsaacs, Cameron, MD 03/12/18 1003

## 2018-03-11 NOTE — ED Triage Notes (Signed)
Pt brought in by [employer] under IVC papers that state:  "[name] has been previously diagnosed with schizophrenia. He has been prescribed medication for treatment but is currently non-compliant with his medication regimen. He has history of mental commitments, as recently as last year for similar reasons. [name] is acting erratically and not wanting to interact with family and friends. [name]'s family states that he has been off his meds for over month now and he is regressing. Becoming more and more solitary, he is not eating or bathing or sleeping. Family is concerned for his safety as he continues to regress."

## 2018-03-12 ENCOUNTER — Other Ambulatory Visit: Payer: Self-pay

## 2018-03-12 ENCOUNTER — Encounter (HOSPITAL_COMMUNITY): Payer: Self-pay

## 2018-03-12 DIAGNOSIS — F200 Paranoid schizophrenia: Secondary | ICD-10-CM

## 2018-03-12 DIAGNOSIS — F333 Major depressive disorder, recurrent, severe with psychotic symptoms: Secondary | ICD-10-CM | POA: Diagnosis present

## 2018-03-12 DIAGNOSIS — G4700 Insomnia, unspecified: Secondary | ICD-10-CM

## 2018-03-12 LAB — MAGNESIUM: Magnesium: 2.2 mg/dL (ref 1.7–2.4)

## 2018-03-12 LAB — POTASSIUM: Potassium: 3.2 mmol/L — ABNORMAL LOW (ref 3.5–5.1)

## 2018-03-12 MED ORDER — MAGNESIUM HYDROXIDE 400 MG/5ML PO SUSP: 30.0000 mL | Freq: Every day | ORAL | Status: DC | PRN

## 2018-03-12 MED ORDER — TRAZODONE HCL 50 MG PO TABS
50.0000 mg | ORAL_TABLET | Freq: Every evening | ORAL | Status: DC | PRN
  Administered 2018-03-12: 50 mg via ORAL
  Filled 2018-03-12 (×3): qty 1

## 2018-03-12 MED ORDER — ADULT MULTIVITAMIN W/MINERALS CH
1.0000 | ORAL_TABLET | Freq: Every day | ORAL | Status: DC
  Administered 2018-03-13 – 2018-03-18 (×6): 1 via ORAL
  Filled 2018-03-12 (×9): qty 1

## 2018-03-12 MED ORDER — RISPERIDONE 3 MG PO TABS
3.0000 mg | ORAL_TABLET | Freq: Two times a day (BID) | ORAL | Status: DC
  Administered 2018-03-12 – 2018-03-18 (×12): 3 mg via ORAL
  Filled 2018-03-12 (×16): qty 1
  Filled 2018-03-12: qty 3

## 2018-03-12 MED ORDER — OLANZAPINE 5 MG PO TBDP: 5.0000 mg | ORAL_TABLET | Freq: Three times a day (TID) | ORAL | Status: DC | PRN

## 2018-03-12 MED ORDER — ACETAMINOPHEN 325 MG PO TABS: 650.0000 mg | ORAL_TABLET | Freq: Four times a day (QID) | ORAL | Status: DC | PRN

## 2018-03-12 MED ORDER — ALUM & MAG HYDROXIDE-SIMETH 200-200-20 MG/5ML PO SUSP: 30.0000 mL | ORAL | Status: DC | PRN

## 2018-03-12 MED ORDER — LORAZEPAM 1 MG PO TABS: 1.0000 mg | ORAL_TABLET | ORAL | Status: DC | PRN

## 2018-03-12 MED ORDER — BENZTROPINE MESYLATE 0.5 MG PO TABS
0.5000 mg | ORAL_TABLET | Freq: Two times a day (BID) | ORAL | Status: DC
  Administered 2018-03-12 – 2018-03-18 (×12): 0.5 mg via ORAL
  Filled 2018-03-12 (×16): qty 1

## 2018-03-12 MED ORDER — ZIPRASIDONE MESYLATE 20 MG IM SOLR: 20.0000 mg | INTRAMUSCULAR | Status: DC | PRN

## 2018-03-12 MED ORDER — ENSURE ENLIVE PO LIQD
237.0000 mL | Freq: Two times a day (BID) | ORAL | Status: DC
  Administered 2018-03-15 – 2018-03-17 (×4): 237 mL via ORAL

## 2018-03-12 MED ORDER — TEMAZEPAM 15 MG PO CAPS
30.0000 mg | ORAL_CAPSULE | Freq: Every day | ORAL | Status: DC
  Administered 2018-03-14: 15 mg via ORAL
  Administered 2018-03-15 – 2018-03-16 (×2): 30 mg via ORAL
  Administered 2018-03-17: 15 mg via ORAL
  Filled 2018-03-12 (×6): qty 2

## 2018-03-12 MED ORDER — HYDROXYZINE HCL 25 MG PO TABS
25.0000 mg | ORAL_TABLET | Freq: Four times a day (QID) | ORAL | Status: DC | PRN
  Administered 2018-03-12: 25 mg via ORAL
  Filled 2018-03-12 (×2): qty 1

## 2018-03-12 NOTE — Progress Notes (Signed)
Bryan [name] is a [age] y.o. male [facility]. During admission, pt's actively responding to internal stimuli. Pt's disorganized and confused and could not answer most of the questions. Pt kept on saying " I am have been trying to better, I  Not been sleeping, I have been exhausted." But pt remained calm and cooperative. Pt's only oriented to person, could not remember much how he got to the hospital. Pt's to sign consents, skin/belongings search completed and pt oriented to unit. Pt stable at this time. Pt given the opportunity to express concerns and ask questions. Pt given toiletries. Will continue to monitor.

## 2018-03-12 NOTE — Progress Notes (Signed)
Did not attend group 

## 2018-03-12 NOTE — Tx Team (Signed)
Initial Treatment Plan 03/12/2018 1:15 AM [name] [facility]:[id number]    PATIENT STRESSORS: Medication change or noncompliance   PATIENT STRENGTHS: Capable of independent living Physical Health Supportive family/friends   PATIENT IDENTIFIED PROBLEMS: Depression  Anxiety  " be back on medications."                 DISCHARGE CRITERIA:  Adequate post-discharge living arrangements Improved stabilization in mood, thinking, and/or behavior Motivation to continue treatment in a less acute level of care  PRELIMINARY DISCHARGE PLAN: Attend aftercare/continuing care group Attend PHP/IOP Return to previous living arrangement  PATIENT/FAMILY INVOLVEMENT: This treatment plan has been presented to and reviewed with the patient, Reedy Doell, and/or family member.  The patient and family have been given the opportunity to ask questions and make suggestions.  Bethann Punches, RN 03/12/2018, 1:15 AM

## 2018-03-12 NOTE — Progress Notes (Signed)
NUTRITION ASSESSMENT  Pt identified as at risk on the Malnutrition Screen Tool  INTERVENTION: - Will order Ensure Enlive BID, each supplement provides 350 kcal and 20 grams of protein. - Will order daily multivitamin with minerals.  - Continue to encourage PO intakes.   NUTRITION DIAGNOSIS: Unintentional weight loss related to sub-optimal intake as evidenced by pt report.   Goal: Pt to meet >/= 90% of their estimated nutrition needs.  Monitor:  PO intake  Assessment:  Patient involuntarily admitted d/t SI. Notes state that patient is a/o to self only, a poor historian, and that thoughts are disorganized. Notes indicate that patient reported poor sleep and that he is very tired.   Per chart review, current weight is 150 lb and weight on 08/20/17 was 179 lb. This indicates 29 lb weight loss (16% body weight) in the past 7 months; significant for time frame.     43 y.o. male  Height: Ht Readings from Last 1 Encounters:  03/12/18 5\' 10"  (1.778 m)    Weight: Wt Readings from Last 1 Encounters:  03/12/18 68 kg    Weight Hx: Wt Readings from Last 10 Encounters:  03/12/18 68 kg  08/20/17 81.6 kg  07/07/17 70.3 kg  01/11/13 86.2 kg  10/27/11 85.7 kg  08/08/11 83.3 kg    BMI:  Body mass index is 21.52 kg/m. Pt meets criteria for normal weight based on current BMI.  Estimated Nutritional Needs: Kcal: 25-30 kcal/kg Protein: > 1 gram protein/kg Fluid: 1 ml/kcal  Diet Order:  Diet Order            Diet regular Room service appropriate? Yes; Fluid consistency: Thin  Diet effective now             Pt is also offered choice of unit snacks mid-morning and mid-afternoon.  Pt is eating as desired.   Lab results and medications reviewed.     Trenton Gammon, MS, RD, LDN, [facility] Inpatient Clinical Dietitian Pager # [phone] After hours/weekend pager # [phone]

## 2018-03-12 NOTE — Plan of Care (Signed)
Progress note  D: pt found in his room; noncompliant with medications. Pt is still extremely paranoid, not leaving his room all day. Pt denies any physical pain or symptoms. Pt denies any si/hi/ah/vh and verbally agrees to approach staff if these become apparent or before harming himself or others while at [facility]. Pt does seem to still be thought blocking and/or hearing voices. Pt is pleasant on the unit though. A: pt provided support and encouragement. Pt given medication per protocol and standing orders. Q76m safety checks implemented and continued.  R: pt safe on the unit. Will continue to monitor.   Pt progressing in the following metrics  Problem: Education: Goal: Knowledge of [facility] Education information/materials will improve Outcome: Progressing Goal: Verbalization of understanding the information provided will improve Outcome: Progressing

## 2018-03-12 NOTE — BHH Suicide Risk Assessment (Signed)
[facility] Admission Suicide Risk Assessment   Nursing information obtained from:  Patient Demographic factors:  Male Current Mental Status:  NA Loss Factors:  Decline in physical health Historical Factors:  NA Risk Reduction Factors:  Living with another person, especially a relative  Total Time spent with patient: 45 minutes Principal Problem: Poor self-care/decline in functioning in the context of noncompliance with psychotic disorder medications Diagnosis:  Active Problems:   MDD (major depressive disorder), recurrent, severe, with psychosis (HCC)   Paranoid schizophrenia (HCC)  Subjective Data:  rambling disorganized and hallucinating but oriented to person place situation giving vague answers Continued Clinical Symptoms:  Alcohol Use Disorder Identification Test Final Score (AUDIT): 0 The "Alcohol Use Disorders Identification Test", Guidelines for Use in Primary Care, Second Edition.  World Science writer [facility]). Score between 0-7:  no or low risk or alcohol related problems. Score between 8-15:  moderate risk of alcohol related problems. Score between 16-19:  high risk of alcohol related problems. Score 20 or above:  warrants further diagnostic evaluation for alcohol dependence and treatment.   CLINICAL FACTORS:   Schizophrenia:   Paranoid or undifferentiated type  COGNITIVE FEATURES THAT CONTRIBUTE TO RISK:  Loss of executive function    SUICIDE RISK:   Minimal: No identifiable suicidal ideation.  Patients presenting with no risk factors but with morbid ruminations; may be classified as minimal risk based on the severity of the depressive symptoms  PLAN OF CARE: Admitted for stabilization  I certify that inpatient services furnished can reasonably be expected to improve the patient's condition.   Malvin Johns, MD 03/12/2018, 12:23 PM

## 2018-03-12 NOTE — H&P (Signed)
Psychiatric Admission Assessment Adult  Patient Identification: Bryan [name] MRN:  [id number] Date of Evaluation:  03/12/2018 Chief Complaint:  schizophrenia Principal Diagnosis: Lack of self-care decline in functioning and med noncompliance with guards to schizophrenic illness Diagnosis:  Active Problems:   MDD (major depressive disorder), recurrent, severe, with psychosis (HCC)   Paranoid schizophrenia (HCC)  History of Present Illness:   Mr. Bryan [name] is a repeat admission last here about a year ago he is 43 years of age does carry diagnosis of chronic psychotic disorder/schizophrenia he required petition for involuntary commitment.  He is noncompliant with his medication regimen and as a result has been behaving erratically, not sleeping not taking care of himself not bathing so forth.  There is evidence he is had recent auditory loose Nations as he has been noted to be responding to stimuli's recently is in the emergency department however he denies this with me.  He continues to make vague statements about his condition stating he is here to get rest here to get himself together the sorts of vague statements over and over but denies any specific symptoms when questioned on review of systems or in mental status exam  Medications are listed as paliperidone 156 mg monthly, and again noncompliant since probably June, oral paliperidone 6 mg a day also noncompliant  According to the assessment team  Bryan [name] is an 43 y.o., single male. Pt presented to [facility] under IVC by his mother [phone]). Per RN Note: Pt brought in by [employer] under IVC papers that state: "[name] has been previously diagnosed with schizophrenia. He has been prescribed medication for treatment but is currently non-compliant with his medication regimen. He has history of mental commitments, as recently as last year for similar reasons. [name] is acting erratically and not wanting to interact with family and  friends. [name]'s family states that he has been off his meds for over month now and he is regressing. Becoming more and more solitary, he is not eating or bathing or sleeping. Family is concerned for his safety as he continues to regress."  Pt presents with AH, due to audibly responding to internal stimuli. Pt denied. Pt reports feeling "exhausted." Pt denied other depression symptoms. [name] reports pt has not been bathing or sleeping. Pt reports 5 hours of sleep nightly. Pt reports being a MM patient at [facility]. Pt reports that he cannot remember the name of his medication, but stated that it is a pill. Pt reports not taking his medication for "a few days."   Pt reports living alone. Pt reports being single and having no children. Pt reports receiving SSI. Pt reports that his mother is supportive. Pt denied legal involvement and probation. Pt reports no major medical issues.   Pt oriented to person ONLY. Pt presented alert, dressed appropriately and somewhat groomed. Pt spoke clearly, but incoherently at times. Pt did not seem to be under the influence of any substances. Pt made no eye contact and answered questions sporadically. Pt answered questions only pertaining to one subject, which was being tired. Pt was verbally responding to some stimuli and stretching saying his back was tense. Pt appeared to be trying to keep something off of him. Pt presented with flat affect, butcalm and open to the assessment process. Pt presented with impairments of remote or recent memory.   Associated Signs/Symptoms: Depression Symptoms:  psychomotor agitation, (Hypo) Manic Symptoms:  Hallucinations, Anxiety Symptoms:  Excessive Worry, Psychotic Symptoms:  Hallucinations: Auditory PTSD Symptoms: NA Total Time spent with patient:  45 minutes  Past Psychiatric History: Again has a history of med noncompliance and even with long-acting injectables  Is the patient at risk to self? Yes.    Has the  patient been a risk to self in the past 6 months? No.  Has the patient been a risk to self within the distant past? No.  Is the patient a risk to others? Yes.    Has the patient been a risk to others in the past 6 months? No.  Has the patient been a risk to others within the distant past? No.   Prior Inpatient Therapy:   Prior Outpatient Therapy:    Alcohol Screening: 1. How often do you have a drink containing alcohol?: Never 2. How many drinks containing alcohol do you have on a typical day when you are drinking?: 1 or 2 3. How often do you have six or more drinks on one occasion?: Never AUDIT-C Score: 0 4. How often during the last year have you found that you were not able to stop drinking once you had started?: Never 5. How often during the last year have you failed to do what was normally expected from you becasue of drinking?: Never 6. How often during the last year have you needed a first drink in the morning to get yourself going after a heavy drinking session?: Never 7. How often during the last year have you had a feeling of guilt of remorse after drinking?: Never 8. How often during the last year have you been unable to remember what happened the night before because you had been drinking?: Never 9. Have you or someone else been injured as a result of your drinking?: No 10. Has a relative or friend or a doctor or another health worker been concerned about your drinking or suggested you cut down?: No Alcohol Use Disorder Identification Test Final Score (AUDIT): 0 Intervention/Follow-up: AUDIT Score <7 follow-up not indicated Substance Abuse History in the last 12 months:  No. Consequences of Substance Abuse: NA Previous Psychotropic Medications: Yes  Psychological Evaluations: No  Past Medical History:  Past Medical History:  Diagnosis Date  . Hypertension   . Psychiatric problem    Se[facility] at [facility] for unknown reason. Takes seroquel. History of hearing voices.  Not  commandivng voices.   . Schizophrenia (HCC)   . Tremors of nervous system     Past Surgical History:  Procedure Laterality Date  . none     Family History: History reviewed. No pertinent family history. Family Psychiatric  History: ukn to pt Tobacco Screening: Have you used any form of tobacco in the last 30 days? (Cigarettes, Smokeless Tobacco, Cigars, and/or Pipes): No Social History:  Social History   Substance and Sexual Activity  Alcohol Use Yes   Comment: says drinks a 40 at least once a week. denies drink in 2-3 weeks.      Social History   Substance and Sexual Activity  Drug Use Yes  . Types: Cocaine, Marijuana   Comment: marijuana somewhat regularly    Additional Social History:      Pain Medications: SEE MAR.  Prescriptions: Pt reports being prescribed MH medications, but stated that he cannot remember the names.  Over the Counter: SEE MAR History of alcohol / drug use?: No history of alcohol / drug abuse Longest period of sobriety (when/how long): Unknown Negative Consequences of Use: (unable to answer) Withdrawal Symptoms: (unable to answer)  Allergies:   Allergies  Allergen Reactions  . Fluphenazine Shortness Of Breath and Swelling    Tongue swelling, and shaking  . Coffee Bean Extract [Coffea Arabica] Swelling   Lab Results:  Results for orders placed or performed during the hospital encounter of 03/11/18 (from the past 48 hour(s))  Magnesium     Status: None   Collection Time: 03/12/18  6:36 AM  Result Value Ref Range   Magnesium 2.2 1.7 - 2.4 mg/dL    Comment: Performed at [facility], [address], [place], [place] [address]  Potassium     Status: Abnormal   Collection Time: 03/12/18  6:36 AM  Result Value Ref Range   Potassium 3.2 (L) 3.5 - 5.1 mmol/L    Comment: Performed at [facility], [address] [place], [place] [address]    Blood Alcohol level:  Lab Results   Component Value Date   ETH <10 03/11/2018   ETH <10 07/05/2017    Metabolic Disorder Labs:  No results found for: HGBA1C, MPG No results found for: PROLACTIN No results found for: CHOL, TRIG, HDL, CHOLHDL, VLDL, LDLCALC  Current Medications: Current Facility-Administered Medications  Medication Dose Route Frequency Provider Last Rate Last Dose  . acetaminophen (TYLENOL) tablet 650 mg  650 mg Oral Q6H PRN Kerry Hough, PA-C      . alum & mag hydroxide-simeth (MAALOX/MYLANTA) 200-200-20 MG/5ML suspension 30 mL  30 mL Oral Q4H PRN Donell Sievert E, PA-C      . feeding supplement (ENSURE ENLIVE) (ENSURE ENLIVE) liquid 237 mL  237 mL Oral BID BM Malvin Johns, MD      . hydrOXYzine (ATARAX/VISTARIL) tablet 25 mg  25 mg Oral Q6H PRN Donell Sievert E, PA-C   25 mg at 03/12/18 0143  . OLANZapine zydis (ZYPREXA) disintegrating tablet 5 mg  5 mg Oral Q8H PRN Kerry Hough, PA-C       And  . LORazepam (ATIVAN) tablet 1 mg  1 mg Oral PRN Kerry Hough, PA-C       And  . ziprasidone (GEODON) injection 20 mg  20 mg Intramuscular PRN Donell Sievert E, PA-C      . magnesium hydroxide (MILK OF MAGNESIA) suspension 30 mL  30 mL Oral Daily PRN Kerry Hough, PA-C      . multivitamin with minerals tablet 1 tablet  1 tablet Oral Daily Malvin Johns, MD      . traZODone (DESYREL) tablet 50 mg  50 mg Oral QHS PRN Donell Sievert E, PA-C   50 mg at 03/12/18 0143   PTA Medications: Medications Prior to Admission  Medication Sig Dispense Refill Last Dose  . benztropine (COGENTIN) 1 MG tablet Take 1 tablet (1 mg total) by mouth 2 (two) times daily. For prevention of drug induced tremors (Patient not taking: Reported on 03/11/2018) 60 tablet 0 Not Taking at Unknown time  . gabapentin (NEURONTIN) 100 MG capsule Take 2 capsules (200 mg total) by mouth 2 (two) times daily. For agitation (Patient not taking: Reported on 03/11/2018) 120 capsule 0 Not Taking at Unknown time  . paliperidone (INVEGA SUSTENNA)  156 MG/ML SUSY injection Inject 1 mL (156 mg total) into the muscle every 30 (thirty) days. (Due on 08-10-17): For mood control (Patient not taking: Reported on 03/11/2018) 1.2 mL 0 Not Taking at Unknown time  . paliperidone (INVEGA) 6 MG 24 hr tablet Take 1 tablet (6 mg total) by mouth daily. For mood control (Patient not  taking: Reported on 03/11/2018) 15 tablet 0 Not Taking at Unknown time  . traZODone (DESYREL) 50 MG tablet Take 1 tablet (50 mg total) by mouth at bedtime as needed for sleep. (Patient not taking: Reported on 03/11/2018) 30 tablet 0 Not Taking at Unknown time    Musculoskeletal: Strength & Muscle Tone: within normal limits Gait & Station: normal Patient leans: N/A  Psychiatric Specialty Exam: Physical Exam  ROS  Blood pressure (!) 123/103, pulse (!) 106, temperature (!) 97.5 F (36.4 C), temperature source Oral, resp. rate 18, height 5\' 10"  (1.778 m), weight 68 kg, SpO2 100 %.Body mass index is 21.52 kg/m.  General Appearance: Casual  Eye Contact:  Good  Speech:  Clear and Coherent  Volume:  Normal  Mood:  Anxious and Dysphoric  Affect:  Congruent  Thought Process:  Irrelevant  Orientation:  Full (Time, Place, and Person)  Thought Content:  Illogical, Delusions and Hallucinations: Auditory  Suicidal Thoughts:  No  Homicidal Thoughts:  No  Memory:  Immediate;   Fair  Judgement:  impaired  Insight:  Lacking  Psychomotor Activity:  Decreased  Concentration:  Concentration: Fair  Recall:  [employer]  Fund of Knowledge:  Fair  Language:  Good  Akathisia:  Negative  Handed:  Right  AIMS (if indicated):     Assets:  Resilience Social Support  ADL's:  Intact  Cognition:  WNL  Sleep:  Number of Hours: 0    Treatment Plan Summary: Daily contact with patient to assess and evaluate symptoms and progress in treatment, Medication management and Plan Resume antipsychotic therapy full treatment per team  Observation Level/Precautions:  15 minute checks  Laboratory:  UDS   Psychotherapy:    Medications:    Consultations:    Discharge Concerns:    Estimated LOS:  Other:     Physician Treatment Plan for Primary Diagnosis: <principal problem not specified> Long Term Goal(s): Improvement in symptoms so as ready for discharge  Short Term Goals: Ability to disclose and discuss suicidal ideas, Ability to demonstrate self-control will improve and Ability to identify and develop effective coping behaviors will improve  Physician Treatment Plan for Secondary Diagnosis: Active Problems:   MDD (major depressive disorder), recurrent, severe, with psychosis (HCC)   Paranoid schizophrenia (HCC)  Long Term Goal(s): Improvement in symptoms so as ready for discharge  Short Term Goals: Ability to identify and develop effective coping behaviors will improve, Compliance with prescribed medications will improve and Ability to identify triggers associated with substance abuse/mental health issues will improve  In summary, acute exacerbation of underlying schizophrenic condition in the context of a negative drug screen but noncompliance with medication   I certify that inpatient services furnished can reasonably be expected to improve the patient's condition.    Malvin JohnsFARAH,Maleaha Hughett, MD 1/15/202012:19 PM

## 2018-03-12 NOTE — BHH Counselor (Addendum)
Adult Comprehensive Assessment  Patient ID: Bryan [name], male   DOB: 1975/10/27, 43 y.o.   MRN: [id number]  Information Source: Information source: Patient  Current Stressors:  Patient states their primary concerns and needs for treatment are:: [place]ain stated he had not been sleeping for a long time. He stated he is sensitive to noises, easily startled, feeling "panicky" and "jumpy" Patient states their goals for this hospitilization and ongoing recovery are:: He stated he needs to manage his medication and daily self care better.  Family Relationships: He reported tension with his mother  Living/Environment/Situation:  Living Arrangements: Alone Living conditions (as described by patient or guardian): Quiet and comfortable How long has patient lived in current situation?: 5 years What is atmosphere in current home: Comfortable  Family History:  Marital status: Single Are you sexually active?: No What is your sexual orientation?: heterosexual Does patient have children?: No(per mother)  Childhood History:  By whom was/is the patient raised?: Both parents Additional childhood history information: Parents divorced when pt was teenager.  Positive childhood Description of patient's relationship with caregiver when they were a child: mom: good, dad: good Patient's description of current relationship with people who raised him/her: mom: good, dad: good How were you disciplined when you got in trouble as a child/adolescent?: appropriate discipline Does patient have siblings?: Yes Number of Siblings: 2 Description of patient's current relationship with siblings: 2 brothers: some conflict with brothers but they get along "until they get on each others nerves' Did patient suffer any verbal/emotional/physical/sexual abuse as a child?: No Did patient suffer from severe childhood neglect?: No Has patient ever been sexually abused/assaulted/raped as an adolescent or adult?: No Was the patient  ever a victim of a crime or a disaster?: No Witnessed domestic violence?: No Has patient been effected by domestic violence as an adult?: No  Education:  Highest grade of school patient has completed: 9th grade Currently a student?: No Learning disability?: Yes What learning problems does patient have?: reading problems  Employment/Work Situation:   Employment situation: On disability Why is patient on disability: mental health How long has patient been on disability: 2001 Patient's job has been impacted by current illness: (na) What is the longest time patient has a held a job?: 1 year, almost Where was the patient employed at that time?: [employer] Has patient ever been in the [employer]?: No Are There Guns or Other Weapons in Your Home?: No   Alcohol/Substance Abuse:   What has been your use of drugs/alcohol within the last 12 months?: Patient denies use Alcohol/Substance Abuse Treatment Hx: Denies past history Has alcohol/substance abuse ever caused legal problems?: No  Social Support System:   Conservation officer, nature Support System: Good Describe Community Support System: Family, mother, father, friends Type of faith/religion: "I have a belief in God...faith" How does patient's faith help to cope with current illness?: "I believe things will work outAgricultural consultant:   Leisure and Hobbies: Patient enjoys doing things outdoors, "getting fresh air...walking in the park...being able to do basic things."  Strengths/Needs:   What is the patient's perception of their strengths?: Faith  Patient states they can use these personal strengths during their treatment to contribute to their recovery: "It can help me" Patient states these barriers may affect/interfere with their treatment: Not sleeping, tension that he stated he feels Patient states these barriers may affect their return to the community: Not sleeping Other important information patient would like considered in planning for  their treatment: He stated he would like  to not feel "panicky and jumpy". Also, he stated that he would like to be able to be able to manage his self care.  Discharge Plan:   Patient states concerns and preferences for aftercare planning are: He owuld like to follow up with [facility] Patient states they will know when they are safe and ready for discharge when: Patient stated when he is not feeling so sensitive Does patient have access to transportation?: Yes(Family) Does patient have financial barriers related to discharge medications?: No Will patient be returning to same living situation after discharge?: Yes  Summary/Recommendations:   Summary and Recommendations (to be completed by the evaluator): Bryan [name] is a 43 yo [place] male who is diagnosed with schizophrenia. He presents involuntarily with symptoms of feeling easily startled, sensitivity to noises, disorganized thoughts, lack of sleep and self care. He would like to improve his self care and not feel so "fragile". He will follow up with [facility] after leaving [facility]. While here, he may benefit from crises stabilization, medication management, a therapeutic milieu and referral to services.    Cherie Alfonse Ras Lorri Frederick. 03/12/2018

## 2018-03-12 NOTE — Tx Team (Signed)
Interdisciplinary Treatment and Diagnostic Plan Update  03/12/2018 Time of Session: 0852 Bryan [name] MRN: [id number]  Principal Diagnosis: <principal problem not specified>  Secondary Diagnoses: Active Problems:   MDD (major depressive disorder), recurrent, severe, with psychosis ([name])   Paranoid schizophrenia ([name])   Current Medications:  Current Facility-Administered Medications  Medication Dose Route Frequency Provider Last Rate Last Dose  . acetaminophen (TYLENOL) tablet 650 mg  650 mg Oral Q6H PRN Laverle Hobby, PA-C      . alum & mag hydroxide-simeth (MAALOX/MYLANTA) 200-200-20 MG/5ML suspension 30 mL  30 mL Oral Q4H PRN Laverle Hobby, PA-C      . benztropine (COGENTIN) tablet 0.5 mg  0.5 mg Oral BID [name], MD      . feeding supplement (ENSURE ENLIVE) (ENSURE ENLIVE) liquid 237 mL  237 mL Oral BID BM [name], MD      . hydrOXYzine (ATARAX/VISTARIL) tablet 25 mg  25 mg Oral Q6H PRN [name], PA-C   25 mg at 03/12/18 0143  . OLANZapine zydis (ZYPREXA) disintegrating tablet 5 mg  5 mg Oral Q8H PRN Laverle Hobby, PA-C       And  . LORazepam (ATIVAN) tablet 1 mg  1 mg Oral PRN [name], PA-C       And  . ziprasidone (GEODON) injection 20 mg  20 mg Intramuscular PRN [name], PA-C      . magnesium hydroxide (MILK OF MAGNESIA) suspension 30 mL  30 mL Oral Daily PRN Laverle Hobby, PA-C      . multivitamin with minerals tablet 1 tablet  1 tablet Oral Daily [name], MD      . risperiDONE (RISPERDAL) tablet 3 mg  3 mg Oral BID [name], MD      . temazepam (RESTORIL) capsule 30 mg  30 mg Oral QHS [name], MD      . traZODone (DESYREL) tablet 50 mg  50 mg Oral QHS PRN [name], PA-C   50 mg at 03/12/18 0143   PTA Medications: Medications Prior to Admission  Medication Sig Dispense Refill Last Dose  . benztropine (COGENTIN) 1 MG tablet Take 1 tablet (1 mg total) by mouth 2 (two) times daily. For prevention of drug  induced tremors (Patient not taking: Reported on 03/11/2018) 60 tablet 0 Not Taking at Unknown time  . gabapentin (NEURONTIN) 100 MG capsule Take 2 capsules (200 mg total) by mouth 2 (two) times daily. For agitation (Patient not taking: Reported on 03/11/2018) 120 capsule 0 Not Taking at Unknown time  . paliperidone (INVEGA SUSTENNA) 156 MG/ML SUSY injection Inject 1 mL (156 mg total) into the muscle every 30 (thirty) days. (Due on 08-10-17): For mood control (Patient not taking: Reported on 03/11/2018) 1.2 mL 0 Not Taking at Unknown time  . paliperidone (INVEGA) 6 MG 24 hr tablet Take 1 tablet (6 mg total) by mouth daily. For mood control (Patient not taking: Reported on 03/11/2018) 15 tablet 0 Not Taking at Unknown time  . traZODone (DESYREL) 50 MG tablet Take 1 tablet (50 mg total) by mouth at bedtime as needed for sleep. (Patient not taking: Reported on 03/11/2018) 30 tablet 0 Not Taking at Unknown time    Patient Stressors: Medication change or noncompliance  Patient Strengths: Capable of independent living Physical Health Supportive family/friends  Treatment Modalities: Medication Management, Group therapy, Case management,  1 to 1 session with clinician, Psychoeducation, Recreational therapy.   Physician Treatment Plan for Primary Diagnosis: <  principal problem not specified> Long Term Goal(s): Improvement in symptoms so as ready for discharge Improvement in symptoms so as ready for discharge   Short Term Goals: Ability to disclose and discuss suicidal ideas Ability to demonstrate self-control will improve Ability to identify and develop effective coping behaviors will improve Ability to identify and develop effective coping behaviors will improve Compliance with prescribed medications will improve Ability to identify triggers associated with substance abuse/mental health issues will improve  Medication Management: Evaluate patient's response, side effects, and tolerance of medication  regimen.  Therapeutic Interventions: 1 to 1 sessions, Unit Group sessions and Medication administration.  Evaluation of Outcomes: Not Met  Physician Treatment Plan for Secondary Diagnosis: Active Problems:   MDD (major depressive disorder), recurrent, severe, with psychosis ([name])   Paranoid schizophrenia ([name])  Long Term Goal(s): Improvement in symptoms so as ready for discharge Improvement in symptoms so as ready for discharge   Short Term Goals: Ability to disclose and discuss suicidal ideas Ability to demonstrate self-control will improve Ability to identify and develop effective coping behaviors will improve Ability to identify and develop effective coping behaviors will improve Compliance with prescribed medications will improve Ability to identify triggers associated with substance abuse/mental health issues will improve     Medication Management: Evaluate patient's response, side effects, and tolerance of medication regimen.  Therapeutic Interventions: 1 to 1 sessions, Unit Group sessions and Medication administration.  Evaluation of Outcomes: Not Met   RN Treatment Plan for Primary Diagnosis: <principal problem not specified> Long Term Goal(s): Knowledge of disease and therapeutic regimen to maintain health will improve  Short Term Goals: Ability to identify and develop effective coping behaviors will improve and Compliance with prescribed medications will improve  Medication Management: RN will administer medications as ordered by provider, will assess and evaluate patient's response and provide education to patient for prescribed medication. RN will report any adverse and/or side effects to prescribing provider.  Therapeutic Interventions: 1 on 1 counseling sessions, Psychoeducation, Medication administration, Evaluate responses to treatment, Monitor vital signs and CBGs as ordered, Perform/monitor CIWA, COWS, AIMS and Fall Risk screenings as ordered, Perform wound care  treatments as ordered.  Evaluation of Outcomes: Not Met   LCSW Treatment Plan for Primary Diagnosis: <principal problem not specified> Long Term Goal(s): Safe transition to appropriate next level of care at discharge, Engage patient in therapeutic group addressing interpersonal concerns.  Short Term Goals: Engage patient in aftercare planning with referrals and resources, Increase social support and Increase skills for wellness and recovery  Therapeutic Interventions: Assess for all discharge needs, 1 to 1 time with Social worker, Explore available resources and support systems, Assess for adequacy in community support network, Educate family and significant other(s) on suicide prevention, Complete Psychosocial Assessment, Interpersonal group therapy.  Evaluation of Outcomes: Not Met   Progress in Treatment: Attending groups: No. Participating in groups: No. Taking medication as prescribed: No. Toleration medication: No. Family/Significant other contact made: No, will contact:  pt declined consent Patient understands diagnosis: No Discussing patient identified problems/goals with staff: Yes. Medical problems stabilized or resolved: Yes. Denies suicidal/homicidal ideation: Yes. Issues/concerns per patient self-inventory: No. Other: none  New problem(s) identified: No, Describe:  none  New Short Term/Long Term Goal(s):  Patient Goals:  "stop making mistakes, get back to what I was doing"  Discharge Plan or Barriers:   Reason for Continuation of Hospitalization: Medication stabilization Other; describe negative symptoms of schizophrenia  Estimated Length of Stay: 3-5 days.  Attendees: Patient:Bryan [name] 03/12/2018  Physician: Dr Jake Samples, MD 03/12/2018   Nursing: Keane Police, RN 03/12/2018   RN Care Manager: 03/12/2018   Social Worker: [name], LCSW 03/12/2018   Recreational Therapist:  03/12/2018   Other:  03/12/2018   Other:  03/12/2018   Other: 03/12/2018         Scribe for Treatment Team: Joanne Chars, [place] 03/12/2018 2:43 PM

## 2018-03-12 NOTE — Progress Notes (Signed)
Recreation Therapy Notes  Date: 1.15.20 Time: 1000 Location: 500 Hall Dayroom  Group Topic: Communication  Goal Area(s) Addresses:  Patient will effectively communicate with peers in group.  Patient will verbalize benefit of healthy communication. Patient will verbalize positive effect of healthy communication on post d/c goals.  Patient will identify communication techniques that made activity effective for group.   Intervention:  Worksheet, pencils  Activity: Back to Back Drawings.  Patients were placed in groups of 2 and seated back to back.  One person was given a geometrical drawing that they had to describe to their partner.  Their partner was to draw the picture as it was described them however, they could not ask any questions.    Education: Communication, Discharge Planning  Education Outcome: Acknowledges understanding/In group clarification offered/Needs additional education.   Clinical Observations/Feedback: Pt did not attend group.     Caroll Rancher, LRT/CTRS    Caroll Rancher A 03/12/2018 11:51 AM

## 2018-03-12 NOTE — [facility] Suicide Risk Assessment (Addendum)
[facility] INPATIENT:  Family/Significant Other Suicide Prevention Education  Suicide Prevention Education:  Patient Refusal for Family/Significant Other Suicide Prevention Education: The patient Bryan [name] has refused to provide written consent for family/significant other to be provided Family/Significant Other Suicide Prevention Education during admission and/or prior to discharge.  Physician notified.   Cherie Bohaboy Lorri Frederick 03/12/2018, 3:48 PM

## 2018-03-13 NOTE — Progress Notes (Signed)
[facility]  03/13/2018 9:05 AM Bryan [name]  MRN:  [id number] Subjective:   Patient stays isolative in his room continues to answer all questions with vague types of answers that do not provide a great deal of information No EPS or TD Denies specific auditory visual hallucinations prominent negative symptoms and disorganized thought but again denies hallucinations, denies thoughts of harming self or others  Principal Problem: Psychosis/disorganization Diagnosis: Active Problems:   MDD (major depressive disorder), recurrent, severe, with psychosis ([facility])   Paranoid schizophrenia ([facility])  Total Time spent with patient: 20 minutes Past Medical History:  Past Medical History:  Diagnosis Date  . Hypertension   . Psychiatric problem    [facility]. Takes seroquel. History of hearing voices.  Not commandivng voices.   . Schizophrenia ([facility])   . Tremors of nervous system     Past Surgical History:  Procedure Laterality Date  . none     Family History: History reviewed. No pertinent family history. Social History:  Social History   Substance and Sexual Activity  Alcohol Use Yes   Comment: says drinks a 40 at least once a week. denies drink in 2-3 weeks.      Social History   Substance and Sexual Activity  Drug Use Yes  . Types: Cocaine, Marijuana   Comment: marijuana somewhat regularly    Social History   Socioeconomic History  . Marital status: Married    Spouse name: Not on file  . Number of children: Not on file  . Years of education: Not on file  . Highest education level: Not on file  Occupational History  . Not on file  Social Needs  . Financial resource strain: Not on file  . Food insecurity:    Worry: Not on file    Inability: Not on file  . Transportation needs:    Medical: Not on file    Non-medical: Not on file  Tobacco Use  . Smoking status: Current Every Day Smoker    Packs/day: 0.50    Types: Cigarettes  .  Smokeless tobacco: Never Used  Substance and Sexual Activity  . Alcohol use: Yes    Comment: says drinks a 40 at least once a week. denies drink in 2-3 weeks.   . Drug use: Yes    Types: Cocaine, Marijuana    Comment: marijuana somewhat regularly  . Sexual activity: Never  Lifestyle  . Physical activity:    Days per week: Not on file    Minutes per session: Not on file  . Stress: Not on file  Relationships  . Social connections:    Talks on phone: Not on file    Gets together: Not on file    Attends religious service: Not on file    Active member of club or organization: Not on file    Attends meetings of clubs or organizations: Not on file    Relationship status: Not on file  Other Topics Concern  . Not on file  Social History Narrative   LI[place] in [place] in an apartment with his brother. Unclear why on medicaid.   Additional Social History:    Pain Medications: SEE MAR.  Prescriptions: Pt reports being prescribed MH medications, but stated that he cannot remember the names.  Over the Counter: SEE MAR History of alcohol / drug use?: No history of alcohol / drug abuse Longest period of sobriety (when/how long): Unknown Negative Consequences of Use: (unable to answer) Withdrawal Symptoms: (  unable to answer)                    Sleep: Fair  Appetite:  Fair  Current Medications: Current Facility-Administered Medications  Medication Dose Route Frequency Provider Last Rate Last Dose  . acetaminophen (TYLENOL) tablet 650 mg  650 mg Oral Q6H PRN Kerry Hough, PA-C      . alum & mag hydroxide-simeth (MAALOX/MYLANTA) 200-200-20 MG/5ML suspension 30 mL  30 mL Oral Q4H PRN Kerry Hough, PA-C      . benztropine (COGENTIN) tablet 0.5 mg  0.5 mg Oral BID Malvin Johns, MD   0.5 mg at 03/13/18 9678  . feeding supplement (ENSURE ENLIVE) (ENSURE ENLIVE) liquid 237 mL  237 mL Oral BID BM Malvin Johns, MD      . hydrOXYzine (ATARAX/VISTARIL) tablet 25 mg  25 mg Oral Q6H  PRN Donell Sievert E, PA-C   25 mg at 03/12/18 0143  . OLANZapine zydis (ZYPREXA) disintegrating tablet 5 mg  5 mg Oral Q8H PRN Kerry Hough, PA-C       And  . LORazepam (ATIVAN) tablet 1 mg  1 mg Oral PRN Donell Sievert E, PA-C       And  . ziprasidone (GEODON) injection 20 mg  20 mg Intramuscular PRN Donell Sievert E, PA-C      . magnesium hydroxide (MILK OF MAGNESIA) suspension 30 mL  30 mL Oral Daily PRN Kerry Hough, PA-C      . multivitamin with minerals tablet 1 tablet  1 tablet Oral Daily Malvin Johns, MD   1 tablet at 03/13/18 [id number]  . risperiDONE (RISPERDAL) tablet 3 mg  3 mg Oral BID Malvin Johns, MD   3 mg at 03/13/18 0175  . temazepam (RESTORIL) capsule 30 mg  30 mg Oral QHS Malvin Johns, MD      . traZODone (DESYREL) tablet 50 mg  50 mg Oral QHS PRN Kerry Hough, PA-C   50 mg at 03/12/18 1025    Lab Results:  Results for orders placed or performed during the hospital encounter of 03/11/18 (from the past 48 hour(s))  Magnesium     Status: None   Collection Time: 03/12/18  6:36 AM  Result Value Ref Range   Magnesium 2.2 1.7 - 2.4 mg/dL    Comment: Performed at [facility], [address] [place], [place] [address]  Potassium     Status: Abnormal   Collection Time: 03/12/18  6:36 AM  Result Value Ref Range   Potassium 3.2 (L) 3.5 - 5.1 mmol/L    Comment: Performed at [facility], [address] [place], [place] [address]    Blood Alcohol level:  Lab Results  Component Value Date   ETH <10 03/11/2018   ETH <10 07/05/2017    Metabolic Disorder Labs: No results found for: HGBA1C, MPG No results found for: PROLACTIN No results found for: CHOL, TRIG, HDL, CHOLHDL, VLDL, LDLCALC  Physical Findings: AIMS: Facial and Oral Movements Muscles of Facial Expression: None, normal Lips and Perioral Area: None, normal Jaw: None, normal Tongue: None, normal,Extremity Movements Upper (arms, wrists, hands, fingers): None,  normal Lower (legs, knees, ankles, toes): None, normal, Trunk Movements Neck, shoulders, hips: None, normal, Overall Severity Severity of abnormal movements (highest score from questions above): None, normal Incapacitation due to abnormal movements: None, normal Patient's awareness of abnormal movements (rate only patient's report): No Awareness, Dental Status Current problems with teeth and/or dentures?: No Does patient  usually wear dentures?: No  CIWA:  CIWA-Ar Total: 8 COWS:  COWS Total Score: 5  Musculoskeletal: Strength & Muscle Tone: within normal limits Gait & Station: normal Patient leans: N/A  Psychiatric Specialty Exam: Physical Exam  ROS  Blood pressure (!) 123/103, pulse (!) 106, temperature (!) 97.5 F (36.4 C), temperature source Oral, resp. rate 18, height 5\' 10"  (1.778 m), weight 68 kg, SpO2 100 %.Body mass index is 21.52 kg/m.  General Appearance: Casual and Disheveled  Eye Contact:  Fair  Speech:  Clear and Coherent  Volume:  Decreased  Mood:  Dysphoric  Affect:  Congruent and Constricted  Thought Process:  Disorganized  Orientation:  Full (Time, Place, and Person)  Thought Content:  Illogical and Tangential  Suicidal Thoughts:  No  Homicidal Thoughts:  No  Memory:  Immediate;   Fair  Judgement:  Fair  Insight:  Fair  Psychomotor Activity:  Normal  Concentration:  Concentration: Fair  Recall:  [employer] of Knowledge:  Fair  Language:  Fair  Akathisia:  Negative  Handed:  Right  AIMS (if indicated):     Assets:  Communication Skills Desire for Improvement  ADL's:  Intact  Cognition:  WNL  Sleep:  Number of Hours: 8.75     Treatment Plan Summary: Daily contact with patient to assess and evaluate symptoms and progress in treatment, Medication management and Plan Continue current antipsychotic and reality based therapy continue cognitive-based therapy continue current precautions  Arwen Haseley, MD 03/13/2018, 9:05 AM

## 2018-03-13 NOTE — Plan of Care (Signed)
  Problem: Coping: Goal: Coping ability will improve Outcome: Not Progressing  Dar Note: Patient presents with flat affect and depressed mood.  Denies suicidal thoughts, auditory and visual hallucinations.  Medications given as prescribed.  Remained withdrawn and isolates to his room for most of this shift.  Forward little and response in minimal during assessment.  Patient is unkempt and malodorous.  Encouraged and redirected several times about his personal hygiene with poor result.  Routine safety checks maintained every 15 minutes.  Patient is safe on the unit.

## 2018-03-13 NOTE — [facility] Group Notes (Signed)
[facility] LCSW Group Therapy Note  Date/Time: 03/13/18, 1300  Type of Therapy/Topic:  Group Therapy:  Balance in Life  Participation Level:  Did not attend  Description of Group:    This group will address the concept of balance and how it feels and looks when one is unbalanced. Patients will be encouraged to process areas in their lives that are out of balance, and identify reasons for remaining unbalanced. Facilitators will guide patients utilizing problem- solving interventions to address and correct the stressor making their life unbalanced. Understanding and applying boundaries will be explored and addressed for obtaining  and maintaining a balanced life. Patients will be encouraged to explore ways to assertively make their unbalanced needs known to significant others in their lives, using other group members and facilitator for support and feedback.  Therapeutic Goals: 1. Patient will identify two or more emotions or situations they have that consume much of in their lives. 2. Patient will identify signs/triggers that life has become out of balance:  3. Patient will identify two ways to set boundaries in order to achieve balance in their lives:  4. Patient will demonstrate ability to communicate their needs through discussion and/or role plays  Summary of Patient Progress:          Therapeutic Modalities:   Cognitive Behavioral Therapy Solution-Focused Therapy Assertiveness Training  Greg Lissie Hinesley, LCSW 

## 2018-03-13 NOTE — Progress Notes (Signed)
Pt observe watching TV in the dayroom. No interaction with peers. Pt denies SI/HI/AVH/Pain at this time. Pt remains paranoid. Pt was guarded/cautrious on approach. No new c/o's. Support offered. Will continue with POC.

## 2018-03-13 NOTE — Progress Notes (Signed)
D: Pt denies SI/HI/AVH. Pt is pleasant and cooperative. Pt continues to talk to people not seen. Pt has been out of his room minimally this evening. Pt continues to be paranoid and delusional at times.  A: Pt was offered support and encouragement.  Pt was encourage to attend groups. Q 15 minute checks were done for safety.  R:Pt attends groups and interacts well with peers and staff. Pt is taking medication. Pt has no complaints.Pt receptive to treatment and safety maintained on unit.  Problem: Coping: Goal: Will verbalize feelings Outcome: Not Progressing   Problem: Self-Concept: Goal: Level of anxiety will decrease Outcome: Not Progressing

## 2018-03-13 NOTE — Progress Notes (Signed)
Recreation Therapy Notes  Date: 1.16.20 Time: 1000 Location: 500 Hall Dayroom   Group Topic: Communication, Team Building, Problem Solving  Goal Area(s) Addresses:  Patient will effectively work with peer towards shared goal.  Patient will identify skill used to make activity successful.  Patient will identify how skills used during activity can be used to reach post d/c goals.   Intervention: STEM Activity   Activity: [employer]. Patients were provided the following materials: 5 drinking straws, 5 rubber bands, 5 paper clips, 2 index cards, and 2 drinking cups. Using the provided materials patients were asked to build a launching mechanisms to launch a ping pong ball approximately 12 feet. Patients were divided into teams of 3-5.   Education: Pharmacist, community, Building control surveyor.   Education Outcome: Acknowledges education/In group clarification offered/Needs additional education.   Clinical Observations/Feedback: Pt did not attend group.    Caroll Rancher, LRT/CTRS     Caroll Rancher A 03/13/2018 12:11 PM

## 2018-03-14 MED ORDER — ARIPIPRAZOLE ER 400 MG IM SRER
400.0000 mg | INTRAMUSCULAR | Status: DC
  Administered 2018-03-15: 400 mg via INTRAMUSCULAR

## 2018-03-14 MED ORDER — ARIPIPRAZOLE 2 MG PO TABS
2.0000 mg | ORAL_TABLET | Freq: Once | ORAL | Status: AC
  Administered 2018-03-14: 2 mg via ORAL
  Filled 2018-03-14 (×2): qty 1

## 2018-03-14 NOTE — [facility] Group Notes (Signed)
Date: 03/14/18, 1315  Type of Therapy and Topic: Chaplain group, "Hope is.." Chaplain engaged group in discussion about hope and what it looks like in each patients life and current situation.  Participation level:Did not attend  Modes of Intervention: Discussion, Education and Socialization  Summary of Progress/Problems:   Gibson Telleria Jon, LCSW   [facility] LCSW Group Therapy Note    

## 2018-03-14 NOTE — Progress Notes (Signed)
Recreation Therapy Notes  Date: 1.17.20 Time: 1000 Location: 500 Hall Dayroom  Group Topic: Coping Skills  Goal Area(s) Addresses:  Pt will identify positive coping skills. Pt will identify benefits of coping skills. Pt will identify benefit of using coping skills post d/c.  Intervention: Worksheet  Activity: Coping Skills A - Z.  Patient was given a worksheet with the alphabet.  Patient was to identify a coping skill that could be used for each letter of the alphabet.    Education: Pharmacologist, Building control surveyor.   Education Outcome: Acknowledges understanding/In group clarification offered/Needs additional education.   Clinical Observations/Feedback: Pt did not attend group.     Caroll Rancher, LRT/CTRS      Lillia Abed, Abdalla Naramore A 03/14/2018 10:59 AM

## 2018-03-14 NOTE — Plan of Care (Signed)
  Problem: Education: Goal: Mental status will improve Outcome: Progressing   Problem: Education: Goal: Knowledge of the prescribed therapeutic regimen will improve Outcome: Progressing

## 2018-03-14 NOTE — Progress Notes (Signed)
[facility] MD Progress Note  03/14/2018 8:24 AM Bryan [name]  MRN:  [id number] Subjective:   Patient continues to be somewhat isolative, he is alert and oriented to person place time situation but not exact date or day.  He knows the month and year.  He continues to deny auditory and visual hallucinations, and continues to answer questions with vague, generalized statements about getting himself together or getting rest. No involuntary movements No EPS or TD Denies thoughts of harming himself or others Generally calm and cooperative through the exam, prominent negative symptoms, prominent formal thought disorder instead of hallucinations  Principal Problem: Chronic psychosis Diagnosis: Active Problems:   MDD (major depressive disorder), recurrent, severe, with psychosis (HCC)   Paranoid schizophrenia (HCC)  Total Time spent with patient: 20 minutes  Past Psychiatric History: Spoke with his mother, patient did not like the sedating effects of previous long-acting injectable such as in [place], requesting a different agent, we discussed the possibility of Abilify will give him a test dose today  Past Medical History:  Past Medical History:  Diagnosis Date  . Hypertension   . Psychiatric problem    Seen at [facility] for unknown reason. Takes seroquel. History of hearing voices.  Not commandivng voices.   . Schizophrenia (HCC)   . Tremors of nervous system     Past Surgical History:  Procedure Laterality Date  . none     Family History: History reviewed. No pertinent family history.  Social History:  Social History   Substance and Sexual Activity  Alcohol Use Yes   Comment: says drinks a 40 at least once a week. denies drink in 2-3 weeks.      Social History   Substance and Sexual Activity  Drug Use Yes  . Types: Cocaine, Marijuana   Comment: marijuana somewhat regularly    Social History   Socioeconomic History  . Marital status: Married    Spouse name: Not on file  . Number  of children: Not on file  . Years of education: Not on file  . Highest education level: Not on file  Occupational History  . Not on file  Social Needs  . Financial resource strain: Not on file  . Food insecurity:    Worry: Not on file    Inability: Not on file  . Transportation needs:    Medical: Not on file    Non-medical: Not on file  Tobacco Use  . Smoking status: Current Every Day Smoker    Packs/day: 0.50    Types: Cigarettes  . Smokeless tobacco: Never Used  Substance and Sexual Activity  . Alcohol use: Yes    Comment: says drinks a 40 at least once a week. denies drink in 2-3 weeks.   . Drug use: Yes    Types: Cocaine, Marijuana    Comment: marijuana somewhat regularly  . Sexual activity: Never  Lifestyle  . Physical activity:    Days per week: Not on file    Minutes per session: Not on file  . Stress: Not on file  Relationships  . Social connections:    Talks on phone: Not on file    Gets together: Not on file    Attends religious service: Not on file    Active member of club or organization: Not on file    Attends meetings of clubs or organizations: Not on file    Relationship status: Not on file  Other Topics Concern  . Not on file  Social History Narrative  LIves in [place] in an apartment with his brother. Unclear why on medicaid.   Additional Social History:    Pain Medications: SEE MAR.  Prescriptions: Pt reports being prescribed MH medications, but stated that he cannot remember the names.  Over the Counter: SEE MAR History of alcohol / drug use?: No history of alcohol / drug abuse Longest period of sobriety (when/how long): Unknown Negative Consequences of Use: (unable to answer) Withdrawal Symptoms: (unable to answer)                    Sleep: Good  Appetite:  Good  Current Medications: Current Facility-Administered Medications  Medication Dose Route Frequency Provider Last Rate Last Dose  . acetaminophen (TYLENOL) tablet 650  mg  650 mg Oral Q6H PRN Kerry HoughSimon, Spencer E, PA-C      . alum & mag hydroxide-simeth (MAALOX/MYLANTA) 200-200-20 MG/5ML suspension 30 mL  30 mL Oral Q4H PRN Kerry HoughSimon, Spencer E, PA-C      . ARIPiprazole (ABILIFY) tablet 2 mg  2 mg Oral Once Malvin JohnsFarah, Vicie Cech, MD      . benztropine (COGENTIN) tablet 0.5 mg  0.5 mg Oral BID Malvin JohnsFarah, Jesse Hirst, MD   0.5 mg at 03/14/18 0800  . feeding supplement (ENSURE ENLIVE) (ENSURE ENLIVE) liquid 237 mL  237 mL Oral BID BM Malvin JohnsFarah, Mayci Haning, MD      . hydrOXYzine (ATARAX/VISTARIL) tablet 25 mg  25 mg Oral Q6H PRN Donell SievertSimon, Spencer E, PA-C   25 mg at 03/12/18 0143  . OLANZapine zydis (ZYPREXA) disintegrating tablet 5 mg  5 mg Oral Q8H PRN Kerry HoughSimon, Spencer E, PA-C       And  . LORazepam (ATIVAN) tablet 1 mg  1 mg Oral PRN Kerry HoughSimon, Spencer E, PA-C       And  . ziprasidone (GEODON) injection 20 mg  20 mg Intramuscular PRN Donell SievertSimon, Spencer E, PA-C      . magnesium hydroxide (MILK OF MAGNESIA) suspension 30 mL  30 mL Oral Daily PRN Kerry HoughSimon, Spencer E, PA-C      . multivitamin with minerals tablet 1 tablet  1 tablet Oral Daily Malvin JohnsFarah, Jakirah Zaun, MD   1 tablet at 03/14/18 0800  . risperiDONE (RISPERDAL) tablet 3 mg  3 mg Oral BID Malvin JohnsFarah, Heather Streeper, MD   3 mg at 03/14/18 0800  . temazepam (RESTORIL) capsule 30 mg  30 mg Oral QHS Malvin JohnsFarah, Orabelle Rylee, MD      . traZODone (DESYREL) tablet 50 mg  50 mg Oral QHS PRN Kerry HoughSimon, Spencer E, PA-C   50 mg at 03/12/18 0143    Lab Results: No results found for this or any previous visit (from the past 48 hour(s)).  Blood Alcohol level:  Lab Results  Component Value Date   ETH <10 03/11/2018   ETH <10 07/05/2017    Metabolic Disorder Labs: No results found for: HGBA1C, MPG No results found for: PROLACTIN No results found for: CHOL, TRIG, HDL, CHOLHDL, VLDL, LDLCALC  Physical Findings: AIMS: Facial and Oral Movements Muscles of Facial Expression: None, normal Lips and Perioral Area: None, normal Jaw: None, normal Tongue: None, normal,Extremity Movements Upper (arms,  wrists, hands, fingers): None, normal Lower (legs, knees, ankles, toes): None, normal, Trunk Movements Neck, shoulders, hips: None, normal, Overall Severity Severity of abnormal movements (highest score from questions above): None, normal Incapacitation due to abnormal movements: None, normal Patient's awareness of abnormal movements (rate only patient's report): No Awareness, Dental Status Current problems with teeth and/or dentures?: No Does patient usually wear dentures?: No  CIWA:  CIWA-Ar Total: 8 COWS:  COWS Total Score: 5  Musculoskeletal: Strength & Muscle Tone: within normal limits Gait & Station: normal Patient leans: N/A  Psychiatric Specialty Exam: Physical Exam  ROS  Blood pressure 104/86, pulse 99, temperature (!) 97.5 F (36.4 C), temperature source Oral, resp. rate 18, height 5\' 10"  (1.778 m), weight 68 kg, SpO2 100 %.Body mass index is 21.52 kg/m.  General Appearance: Casual  Eye Contact:  Fair  Speech:  Clear and Coherent  Volume:  Decreased  Mood:  Dysphoric  Affect:  Congruent  Thought Process:  Disorganized  Orientation:  Full (Time, Place, and Person)  Thought Content:  Illogical and Tangential  Suicidal Thoughts:  No  Homicidal Thoughts:  No  Memory:  Immediate;   Fair  Judgement:  Fair  Insight:  Fair  Psychomotor Activity:  Decreased  Concentration:  Concentration: Fair  Recall:  [employer]Fair  Fund of Knowledge:  Fair  Language:  Fair  Akathisia:  Negative  Handed:  Right  AIMS (if indicated):     Assets:  Leisure Time Physical Health  ADL's:  Intact  Cognition:  WNL  Sleep:  Number of Hours: 8.75  In summary, prominent negative symptoms and formal thought disorder rather than hallucinations as a main manifestation.  No acute dangerousness today   Treatment Plan Summary: Daily contact with patient to assess and evaluate symptoms and progress in treatment, Medication management and Plan Continue risperidone for now, give test dose of aripiprazole,  and if no sensitivity noted administer long-acting injectable tomorrow, probable discharge by Monday.  No change in precautions or therapies  Malvin JohnsFARAH,Lizzete Gough, MD 03/14/2018, 8:24 AM

## 2018-03-14 NOTE — Plan of Care (Signed)
  Problem: Self-Concept: Goal: Level of anxiety will decrease Outcome: Progressing  DAR NOTE: Patient presents with flat affect and depressed mood.  Denies suicidal thoughts, pain, auditory and visual hallucinations.  Described energy level as low and concentration as good.  Rates depression at 7, hopelessness at 8, and anxiety at 2.  Maintained on routine safety checks.  Medications given as prescribed.  Support and encouragement offered as needed.  Attended group and participated.  States goal for today is "get a better understanding for my situation."  Patient observed socializing with peers in the dayroom.  Offered no complaint.

## 2018-03-15 DIAGNOSIS — F333 Major depressive disorder, recurrent, severe with psychotic symptoms: Principal | ICD-10-CM

## 2018-03-15 NOTE — Progress Notes (Signed)
Did not attend group 

## 2018-03-15 NOTE — Progress Notes (Signed)
[facility] MD Progress Note  03/15/2018 12:45 PM Bryan [name]  MRN:  [id number] Subjective: Patient is seen and examined.  Patient is a 43 year old male with a past psychiatric history significant for schizophrenia.  He was admitted on 03/11/2018 under involuntary commitment because of psychosis and agitation.  The patient had been noncompliant with his medications for approximately a month.  He was not eating, bathing or sleeping.  He was admitted to the hospital for evaluation and stabilization.  Objective: Patient is seen and examined.  Patient is a 43 year old male with the above-stated past psychiatric history is seen in follow-up.  He is currently doing better with his current regimen of medications.  He continues on the Abilify injection which he received on 03/15/2018, Cogentin, Risperdal 3 mg p.o. twice daily, Restoril, and trazodone 50 mg p.o. nightly as needed.  Prior to the interview he was in the process of getting in the shower and improving his hygiene.  He denied any active auditory or visual hallucinations.  He denied any suicidal or homicidal ideation.  He slept 6.75 hours last night.  He denied any side effects to his current medications.  His vital signs are stable, he is afebrile.  On admission his potassium was low, his glucose was mildly elevated, and he was slightly dehydrated.  Principal Problem: <principal problem not specified> Diagnosis: Active Problems:   MDD (major depressive disorder), recurrent, severe, with psychosis (HCC)   Paranoid schizophrenia (HCC)  Total Time spent with patient: 15 minutes  Past Psychiatric History: The admission H&P  Past Medical History:  Past Medical History:  Diagnosis Date  . Hypertension   . Psychiatric problem    Seen at [facility] for unknown reason. Takes seroquel. History of hearing voices.  Not commandivng voices.   . Schizophrenia (HCC)   . Tremors of nervous system     Past Surgical History:  Procedure Laterality Date  . none      Family History: History reviewed. No pertinent family history. Family Psychiatric  History: An H&P Social History:  Social History   Substance and Sexual Activity  Alcohol Use Yes   Comment: says drinks a 40 at least once a week. denies drink in 2-3 weeks.      Social History   Substance and Sexual Activity  Drug Use Yes  . Types: Cocaine, Marijuana   Comment: marijuana somewhat regularly    Social History   Socioeconomic History  . Marital status: Married    Spouse name: Not on file  . Number of children: Not on file  . Years of education: Not on file  . Highest education level: Not on file  Occupational History  . Not on file  Social Needs  . Financial resource strain: Not on file  . Food insecurity:    Worry: Not on file    Inability: Not on file  . Transportation needs:    Medical: Not on file    Non-medical: Not on file  Tobacco Use  . Smoking status: Current Every Day Smoker    Packs/day: 0.50    Types: Cigarettes  . Smokeless tobacco: Never Used  Substance and Sexual Activity  . Alcohol use: Yes    Comment: says drinks a 40 at least once a week. denies drink in 2-3 weeks.   . Drug use: Yes    Types: Cocaine, Marijuana    Comment: marijuana somewhat regularly  . Sexual activity: Never  Lifestyle  . Physical activity:    Days per week: Not on  file    Minutes per session: Not on file  . Stress: Not on file  Relationships  . Social connections:    Talks on phone: Not on file    Gets together: Not on file    Attends religious service: Not on file    Active member of club or organization: Not on file    Attends meetings of clubs or organizations: Not on file    Relationship status: Not on file  Other Topics Concern  . Not on file  Social History Narrative   LIves in [place] in an apartment with his brother. Unclear why on medicaid.   Additional Social History:    Pain Medications: SEE MAR.  Prescriptions: Pt reports being prescribed MH  medicatiShidlerons, but stated that he cannot remember the names.  Over the Counter: SEE MAR History of alcohol / drug use?: No history of alcohol / drug abuse Longest period of sobriety (when/how long): Unknown Negative Consequences of Use: (unable to answer) Withdrawal Symptoms: (unable to answer)                    Sleep: Good  Appetite:  Fair  Current Medications: Current Facility-Administered Medications  Medication Dose Route Frequency Provider Last Rate Last Dose  . acetaminophen (TYLENOL) tablet 650 mg  650 mg Oral Q6H PRN Kerry HoughSimon, Spencer E, PA-C      . alum & mag hydroxide-simeth (MAALOX/MYLANTA) 200-200-20 MG/5ML suspension 30 mL  30 mL Oral Q4H PRN Donell SievertSimon, Spencer E, PA-C      . ARIPiprazole ER (ABILIFY MAINTENA) injection 400 mg  400 mg Intramuscular Q28 days Malvin JohnsFarah, Brian, MD   400 mg at 03/15/18 1049  . benztropine (COGENTIN) tablet 0.5 mg  0.5 mg Oral BID Malvin JohnsFarah, Brian, MD   0.5 mg at 03/15/18 0824  . feeding supplement (ENSURE ENLIVE) (ENSURE ENLIVE) liquid 237 mL  237 mL Oral BID BM Malvin JohnsFarah, Brian, MD   237 mL at 03/15/18 1048  . hydrOXYzine (ATARAX/VISTARIL) tablet 25 mg  25 mg Oral Q6H PRN Kerry HoughSimon, Spencer E, PA-C   25 mg at 03/12/18 0143  . OLANZapine zydis (ZYPREXA) disintegrating tablet 5 mg  5 mg Oral Q8H PRN Kerry HoughSimon, Spencer E, PA-C       And  . LORazepam (ATIVAN) tablet 1 mg  1 mg Oral PRN Donell SievertSimon, Spencer E, PA-C       And  . ziprasidone (GEODON) injection 20 mg  20 mg Intramuscular PRN Donell SievertSimon, Spencer E, PA-C      . magnesium hydroxide (MILK OF MAGNESIA) suspension 30 mL  30 mL Oral Daily PRN Kerry HoughSimon, Spencer E, PA-C      . multivitamin with minerals tablet 1 tablet  1 tablet Oral Daily Malvin JohnsFarah, Brian, MD   1 tablet at 03/15/18 [phone]  . risperiDONE (RISPERDAL) tablet 3 mg  3 mg Oral BID Malvin JohnsFarah, Brian, MD   3 mg at 03/15/18 [id number]  . temazepam (RESTORIL) capsule 30 mg  30 mg Oral QHS Malvin JohnsFarah, Brian, MD   15 mg at 03/14/18 2134  . traZODone (DESYREL) tablet 50 mg  50 mg Oral QHS PRN  Kerry HoughSimon, Spencer E, PA-C   50 mg at 03/12/18 0143    Lab Results: No results found for this or any previous visit (from the past 48 hour(s)).  Blood Alcohol level:  Lab Results  Component Value Date   ETH <10 03/11/2018   ETH <10 07/05/2017    Metabolic Disorder Labs: No results found for: HGBA1C, MPG No results  found for: PROLACTIN No results found for: CHOL, TRIG, HDL, CHOLHDL, VLDL, LDLCALC  Physical Findings: AIMS: Facial and Oral Movements Muscles of Facial Expression: None, normal Lips and Perioral Area: None, normal Jaw: None, normal Tongue: None, normal,Extremity Movements Upper (arms, wrists, hands, fingers): None, normal Lower (legs, knees, ankles, toes): None, normal, Trunk Movements Neck, shoulders, hips: None, normal, Overall Severity Severity of abnormal movements (highest score from questions above): None, normal Incapacitation due to abnormal movements: None, normal Patient's awareness of abnormal movements (rate only patient's report): No Awareness, Dental Status Current problems with teeth and/or dentures?: No Does patient usually wear dentures?: No  CIWA:  CIWA-Ar Total: 8 COWS:  COWS Total Score: 5  Musculoskeletal: Strength & Muscle Tone: within normal limits Gait & Station: normal Patient leans: N/A  Psychiatric Specialty Exam: Physical Exam  Nursing note and vitals reviewed. Constitutional: He is oriented to person, place, and time. He appears well-developed and well-nourished.  HENT:  Head: Normocephalic and atraumatic.  Respiratory: Effort normal.  Neurological: He is alert and oriented to person, place, and time.    ROS  Blood pressure 103/78, pulse 91, temperature 97.9 F (36.6 C), resp. rate 20, height 5\' 10"  (1.778 m), weight 68 kg, SpO2 100 %.Body mass index is 21.52 kg/m.  General Appearance: Casual  Eye Contact:  Good  Speech:  Normal Rate  Volume:  Decreased  Mood:  Euthymic  Affect:  Congruent  Thought Process:  Coherent and  Descriptions of Associations: Intact  Orientation:  Full (Time, Place, and Person)  Thought Content:  Logical  Suicidal Thoughts:  No  Homicidal Thoughts:  No  Memory:  Immediate;   Fair Recent;   Fair Remote;   Fair  Judgement:  Intact  Insight:  Fair  Psychomotor Activity:  Normal  Concentration:  Concentration: Fair and Attention Span: Fair  Recall:  [employer] of Knowledge:  Fair  Language:  Fair  Akathisia:  Negative  Handed:  Right  AIMS (if indicated):     Assets:  Desire for Improvement Housing Physical Health Resilience Social Support  ADL's:  Intact  Cognition:  WNL  Sleep:  Number of Hours: 6.75     Treatment Plan Summary: Daily contact with patient to assess and evaluate symptoms and progress in treatment, Medication management and Plan : Patient is seen and examined.  Patient is a 43 year old male with the above-stated past psychiatric history who is seen in follow-up.  He continues to slowly improve.  No change in his current medications.  It appears as though the plan is for him to be discharged by Monday. 1.  Patient will continue to receive the aripiprazole extended release injection 400 mg IM q. 28 days for psychosis. 2.  Continue Cogentin 0.5 mg p.o. twice daily for side effects of medications. 3.  Continue Risperdal 3 mg p.o. twice daily for psychosis. 4.  Continue temazepam 30 mg p.o. nightly for insomnia. 5.  Continue trazodone 50 mg p.o. nightly as needed insomnia. 6.  Disposition planning-if all continues to go well we will plan on discharge on 03/17/2018.  Antonieta Pert, MD 03/15/2018, 12:45 PM

## 2018-03-15 NOTE — [facility] Group Notes (Signed)
[facility] Group Notes: (Clinical Social Work)   03/15/2018      Type of Therapy:  Group Therapy   Participation Level:  Did Not Attend despite MHT prompting   Jeena Arnett Grossman-Orr, LCSW 03/15/2018, 1:28 PM     

## 2018-03-15 NOTE — Progress Notes (Signed)
D:  Bryan [name] has been isolative to his room all shift.  He did not attend evening wrap up group but he did get up for snack.  He denied SI/HI or A/V hallucinations.  He was minimal with his answers but was pleasant.  He at first declined his hs medications but did come back later only agreeing to take 1/2 dose (15mg ) of Restoril, "I think one pill will be fine."  Was encouraged to take entire dose but he adamantly declined.  He denied any pain or discomfort and appeared to be in no physical distress. A:  1:1 with RN for support and encouragement.  Medications as ordered.  Q 15 minute checks maintained for safety.  Encouraged participation in group and unit activities.   R:  Bryan [name] remains safe on the unit.  We will continue to monitor the progress towards his goals.

## 2018-03-15 NOTE — Plan of Care (Signed)
  Problem: Education: Goal: Emotional status will improve Outcome: Progressing Goal: Mental status will improve Outcome: Progressing   Problem: Education: Goal: Mental status will improve Outcome: Progressing  DAR NOTE: Patient presents with flat affect and depressed mood.  Denies suicidal thoughts, pain, auditory and visual hallucinations.  Described energy level as low and concentration as poor.  Rates depression at 5, hopelessness at 0, and anxiety at 0.  Maintained on routine safety checks.  Medications given as prescribed.  Support and encouragement offered as needed.  Attended group and participated.  Patient visible in milieu minimally interacting with peers and watching TV.  Offered no complaint.

## 2018-03-16 NOTE — Progress Notes (Signed)
Pt was informed that he could not go off the unit to supper. Pt stated he was going to the cafeteria to pick out his food, despite what he was told by staff, the pt walked off the unit and lined up with the other pts at the main nurses station. When asked to come back onto the 500 hall, pt became loud, agitated and verbally aggressive towards staff.

## 2018-03-16 NOTE — Progress Notes (Signed)
Pt presents with a flat affect and a depressed mood. Pt appeared to be preoccupied on approach and forwarded little information.  Pt noted to be disheveled, have poor hygiene and a body odor. Pt encouraged by staff to perform ADLs. Pt have minimal interaction on the unit and is isolative to his room.  Pt denies SI/HI. Pt denies AVH. Pt rated on his self inventory sheet: depression 5/10, anxiety 5/10 and hopelessness 4/10. Pt reported  fair sleep last night. No concerns verbalized by pt. Pt compliant with taking medications   Medications reviewed with pt. Medications administered as ordered per MD. Verbal support provided. Pt encouraged to attend groups. 15 minute checks performed for safety.  Pt compliant with tx plan.

## 2018-03-16 NOTE — Progress Notes (Signed)
[facility]  03/16/2018 12:18 PM Rosita Kearthur Masters  MRN:  [id number] Subjective:  Patient is seen and examined.  Patient is a 43 year old male with a past psychiatric history significant for schizophrenia.  He was admitted on 03/11/2018 under involuntary commitment because of psychosis and agitation.  The patient had been noncompliant with his medications for approximately a month.  He was not eating, bathing or sleeping.  He was admitted to the hospital for evaluation and stabilization.  Objective: Patient is seen and examined.  Patient is a 43 year old male with the above-stated past psychiatric history is seen in follow-up.  He continues to slowly improve from a psychotic standpoint.  He denied any auditory hallucinations this morning.  Nursing stated that he was still not doing a good job with his hygiene.  We discussed that today.  I emphasized that part of getting better is being able to bathe, take care of himself.  I pointed out that 1 of the reasons why his family brought him in was because of noncompliance with medication, not eating not bathing or sleeping.  He is sleeping better.  He denied any side effects to his current medications.  He denied any suicidal ideation.  His blood pressure this morning is 92/68, his heart rate was 115.  He slept 6.75 hours last night.  Principal Problem: <principal problem not specified> Diagnosis: Active Problems:   MDD (major depressive disorder), recurrent, severe, with psychosis (HCC)   Paranoid schizophrenia (HCC)  Total Time spent with patient: 15 minutes  Past Psychiatric History: See admission H&P  Past Medical History:  Past Medical History:  Diagnosis Date  . Hypertension   . Psychiatric problem    [facility]. Takes seroquel. History of hearing voices.  Not commandivng voices.   . Schizophrenia (HCC)   . Tremors of nervous system     Past Surgical History:  Procedure Laterality Date  . none     Family  History: History reviewed. No pertinent family history. Family Psychiatric  History: See admission H&P Social History:  Social History   Substance and Sexual Activity  Alcohol Use Yes   Comment: says drinks a 40 at least once a week. denies drink in 2-3 weeks.      Social History   Substance and Sexual Activity  Drug Use Yes  . Types: Cocaine, Marijuana   Comment: marijuana somewhat regularly    Social History   Socioeconomic History  . Marital status: Married    Spouse name: Not on file  . Number of children: Not on file  . Years of education: Not on file  . Highest education level: Not on file  Occupational History  . Not on file  Social Needs  . Financial resource strain: Not on file  . Food insecurity:    Worry: Not on file    Inability: Not on file  . Transportation needs:    Medical: Not on file    Non-medical: Not on file  Tobacco Use  . Smoking status: Current Every Day Smoker    Packs/day: 0.50    Types: Cigarettes  . Smokeless tobacco: Never Used  Substance and Sexual Activity  . Alcohol use: Yes    Comment: says drinks a 40 at least once a week. denies drink in 2-3 weeks.   . Drug use: Yes    Types: Cocaine, Marijuana    Comment: marijuana somewhat regularly  . Sexual activity: Never  Lifestyle  . Physical activity:  Days per week: Not on file    Minutes per session: Not on file  . Stress: Not on file  Relationships  . Social connections:    Talks on phone: Not on file    Gets together: Not on file    Attends religious service: Not on file    Active member of club or organization: Not on file    Attends meetings of clubs or organizations: Not on file    Relationship status: Not on file  Other Topics Concern  . Not on file  Social History Narrative   LIves in [place] in an apartment with his brother. Unclear why on medicaid.   Additional Social History:    Pain Medications: SEE MAR.  Prescriptions: Pt reports being prescribed MH  medications, but stated that he cannot remember the names.  Over the Counter: SEE MAR History of alcohol / drug use?: No history of alcohol / drug abuse Longest period of sobriety (when/how long): Unknown Negative Consequences of Use: (unable to answer) Withdrawal Symptoms: (unable to answer)                    Sleep: Good  Appetite:  Fair  Current Medications: Current Facility-Administered Medications  Medication Dose Route Frequency Provider Last Rate Last Dose  . acetaminophen (TYLENOL) tablet 650 mg  650 mg Oral Q6H PRN Kerry Hough, PA-C      . alum & mag hydroxide-simeth (MAALOX/MYLANTA) 200-200-20 MG/5ML suspension 30 mL  30 mL Oral Q4H PRN Donell Sievert E, PA-C      . ARIPiprazole ER (ABILIFY MAINTENA) injection 400 mg  400 mg Intramuscular Q28 days Malvin Johns, MD   400 mg at 03/15/18 1049  . benztropine (COGENTIN) tablet 0.5 mg  0.5 mg Oral BID Malvin Johns, MD   0.5 mg at 03/16/18 0814  . feeding supplement (ENSURE ENLIVE) (ENSURE ENLIVE) liquid 237 mL  237 mL Oral BID BM Malvin Johns, MD   237 mL at 03/16/18 0938  . hydrOXYzine (ATARAX/VISTARIL) tablet 25 mg  25 mg Oral Q6H PRN Kerry Hough, PA-C   25 mg at 03/12/18 0143  . OLANZapine zydis (ZYPREXA) disintegrating tablet 5 mg  5 mg Oral Q8H PRN Kerry Hough, PA-C       And  . LORazepam (ATIVAN) tablet 1 mg  1 mg Oral PRN Donell Sievert E, PA-C       And  . ziprasidone (GEODON) injection 20 mg  20 mg Intramuscular PRN Donell Sievert E, PA-C      . magnesium hydroxide (MILK OF MAGNESIA) suspension 30 mL  30 mL Oral Daily PRN Kerry Hough, PA-C      . multivitamin with minerals tablet 1 tablet  1 tablet Oral Daily Malvin Johns, MD   1 tablet at 03/16/18 [id number]  . risperiDONE (RISPERDAL) tablet 3 mg  3 mg Oral BID Malvin Johns, MD   3 mg at 03/16/18 7915  . temazepam (RESTORIL) capsule 30 mg  30 mg Oral QHS Malvin Johns, MD   30 mg at 03/15/18 2106  . traZODone (DESYREL) tablet 50 mg  50 mg Oral QHS PRN  Kerry Hough, PA-C   50 mg at 03/12/18 0143    Lab Results: No results found for this or any previous visit (from the past 48 hour(s)).  Blood Alcohol level:  Lab Results  Component Value Date   ETH <10 03/11/2018   ETH <10 07/05/2017    Metabolic Disorder Labs: No results found  for: HGBA1C, MPG No results found for: PROLACTIN No results found for: CHOL, TRIG, HDL, CHOLHDL, VLDL, LDLCALC  Physical Findings: AIMS: Facial and Oral Movements Muscles of Facial Expression: None, normal Lips and Perioral Area: None, normal Jaw: None, normal Tongue: None, normal,Extremity Movements Upper (arms, wrists, hands, fingers): None, normal Lower (legs, knees, ankles, toes): None, normal, Trunk Movements Neck, shoulders, hips: None, normal, Overall Severity Severity of abnormal movements (highest score from questions above): None, normal Incapacitation due to abnormal movements: None, normal Patient's awareness of abnormal movements (rate only patient's report): No Awareness, Dental Status Current problems with teeth and/or dentures?: No Does patient usually wear dentures?: No  CIWA:  CIWA-Ar Total: 8 COWS:  COWS Total Score: 5  Musculoskeletal: Strength & Muscle Tone: within normal limits Gait & Station: normal Patient leans: N/A  Psychiatric Specialty Exam: Physical Exam  Nursing note and vitals reviewed. Constitutional: He is oriented to person, place, and time. He appears well-developed and well-nourished.  HENT:  Head: Normocephalic and atraumatic.  Respiratory: Effort normal.  Neurological: He is alert and oriented to person, place, and time.    ROS  Blood pressure 92/68, pulse (!) 115, temperature 97.8 F (36.6 C), temperature source Oral, resp. rate 18, height 5\' 10"  (1.778 m), weight 68 kg, SpO2 100 %.Body mass index is 21.52 kg/m.  General Appearance: Disheveled  Eye Contact:  Fair  Speech:  Normal Rate  Volume:  Decreased  Mood:  Dysphoric  Affect:  Flat   Thought Process:  Coherent and Descriptions of Associations: Circumstantial  Orientation:  Full (Time, Place, and Person)  Thought Content:  Hallucinations: Auditory  Suicidal Thoughts:  No  Homicidal Thoughts:  No  Memory:  Immediate;   Fair Recent;   Fair Remote;   Fair  Judgement:  Intact  Insight:  Fair  Psychomotor Activity:  Decreased  Concentration:  Concentration: Fair and Attention Span: Fair  Recall:  [employer]Fair  Fund of Knowledge:  Fair  Language:  Fair  Akathisia:  Negative  Handed:  Right  AIMS (if indicated):     Assets:  Desire for Improvement Housing Physical Health Resilience  ADL's:  Intact  Cognition:  WNL  Sleep:  Number of Hours: 6.75     Treatment Plan Summary: Daily contact with patient to assess and evaluate symptoms and progress in treatment, Medication management and Plan : Patient is seen and examined.  Patient is a 43 year old male with the above-stated past psychiatric history who is seen in follow-up. Patient is doing better from a psychotic standpoint, but still having difficulty with his activities of daily living.  I have encouraged him to demonstrate his ability to care for himself, and his show us that he can do the same.  No change in his current medications.  The plan right now is for him to be discharged on Monday or Tuesday. 1.  Patient will continue to receive his aripiprazole extended release injection 400 mg IM q. 28 days for psychosis. 2.  Continue Cogentin 0.5 mg p.o. twice daily for side effects of medications. 3.  Continue Risperdal 3 mg p.o. twice daily for psychosis. 4.  Continue temazepam 30 mg p.o. nightly for insomnia. 5.  Continue trazodone 50 mg p.o. nightly as needed insomnia. 6.  Encourage patient to participate more in activities and follow improved body hygiene. 7.  Disposition planning-if all continues to go well we will plan discharge home 1/20 or 1/21.  Antonieta PertGreg Lawson Shalika Arntz, MD 03/16/2018, 12:18 PM

## 2018-03-16 NOTE — [facility] Group Notes (Signed)
[facility] LCSW Group Therapy Note  Date/Time:  03/16/2018  11:00AM-12:00PM  Type of Therapy and Topic:  Group Therapy:  Music and Mood  Participation Level:  Did Not Attend   Description of Group: In this process group, members listened to a variety of genres of music and identified that different types of music evoke different responses.  Patients were encouraged to identify music that was soothing for them and music that was energizing for them.  Patients discussed how this knowledge can help with wellness and recovery in various ways including managing depression and anxiety as well as encouraging healthy sleep habits.    Therapeutic Goals: 1. Patients will explore the impact of different varieties of music on mood 2. Patients will verbalize the thoughts they have when listening to different types of music 3. Patients will identify music that is soothing to them as well as music that is energizing to them 4. Patients will discuss how to use this knowledge to assist in maintaining wellness and recovery 5. Patients will explore the use of music as a coping skill  Summary of Patient Progress:  N.A  Therapeutic Modalities: Solution Focused Brief Therapy Activity   Lanell Carpenter Grossman-Orr, LCSW    

## 2018-03-16 NOTE — Progress Notes (Signed)
D:  [name] was up and visible on the unit this evening.  Minimal interaction with staff and peers but was pleasant and cooperative.   He did come out of his room when asked for snacks and medications.  He denied SI/HI or A/V hallucinations.  He denied any pain or discomfort and appeared to be in no physical distress.  He did take his full dose of Restoril this evening.  He is currently resting with his eyes closed and appears to be asleep. A:  1:1 with RN for support and encouragement.  Medications as ordered.  Q 15 minute checks maintained for safety.  Encouraged participation in group and unit activities.   R:  Bryan [name] remains safe on the unit.  We will continue to monitor the progress towards his goals.

## 2018-03-17 NOTE — Progress Notes (Signed)
[facility] MD Progress Note  03/17/2018 3:09 PM Bryan [name]  MRN:  [id number] Subjective:  Patient is seen and examined. Patient is a 43 year old male with a past psychiatric history significant for schizophrenia. He was admitted on 03/11/2018 under involuntary commitment because of psychosis and agitation. The patient had been noncompliant with his medications for approximately a month. He was not eating, bathing or sleeping. He was admitted to the hospital for evaluation and stabilization.  Objective: Patient is seen and examined.  Patient is a 43 year old male with the above-stated past psychiatric history seen in follow-up.  He continues to improve.  He is out of his room today, he took a shower and his hygiene is improved.  He carried on a very good conversation with me today.  He denied any auditory or visual hallucinations.  He denied any suicidal or homicidal ideation.  We discussed his noncompliance with medication, at least he is showing some insight right now that he knows he supposed to be taking the medicine.  His vital signs are stable, he is afebrile.  No tachycardia today.  He slept 6.75 hours last night.  Principal Problem: <principal problem not specified> Diagnosis: Active Problems:   MDD (major depressive disorder), recurrent, severe, with psychosis (HCC)   Paranoid schizophrenia (HCC)  Total Time spent with patient: 15 minutes  Past Psychiatric History: See admission H&P  Past Medical History:  Past Medical History:  Diagnosis Date  . Hypertension   . Psychiatric problem    Seen at [facility] for unknown reason. Takes seroquel. History of hearing voices.  Not commandivng voices.   . Schizophrenia (HCC)   . Tremors of nervous system     Past Surgical History:  Procedure Laterality Date  . none     Family History: History reviewed. No pertinent family history. Family Psychiatric  History: See admission H&P Social History:  Social History   Substance and Sexual  Activity  Alcohol Use Yes   Comment: says drinks a 40 at least once a week. denies drink in 2-3 weeks.      Social History   Substance and Sexual Activity  Drug Use Yes  . Types: Cocaine, Marijuana   Comment: marijuana somewhat regularly    Social History   Socioeconomic History  . Marital status: Married    Spouse name: Not on file  . Number of children: Not on file  . Years of education: Not on file  . Highest education level: Not on file  Occupational History  . Not on file  Social Needs  . Financial resource strain: Not on file  . Food insecurity:    Worry: Not on file    Inability: Not on file  . Transportation needs:    Medical: Not on file    Non-medical: Not on file  Tobacco Use  . Smoking status: Current Every Day Smoker    Packs/day: 0.50    Types: Cigarettes  . Smokeless tobacco: Never Used  Substance and Sexual Activity  . Alcohol use: Yes    Comment: says drinks a 40 at least once a week. denies drink in 2-3 weeks.   . Drug use: Yes    Types: Cocaine, Marijuana    Comment: marijuana somewhat regularly  . Sexual activity: Never  Lifestyle  . Physical activity:    Days per week: Not on file    Minutes per session: Not on file  . Stress: Not on file  Relationships  . Social connections:    Talks on phone:  Not on file    Gets together: Not on file    Attends religious service: Not on file    Active member of club or organization: Not on file    Attends meetings of clubs or organizations: Not on file    Relationship status: Not on file  Other Topics Concern  . Not on file  Social History Narrative   [place] in [place] in an apartment with his brother. Unclear why on medicaid.   Additional Social History:    Pain Medications: SEE MAR.  Prescriptions: Pt reports being prescribed MH medications, but stated that he cannot remember the names.  Over the Counter: SEE MAR History of alcohol / drug use?: No history of alcohol / drug abuse Longest  period of sobriety (when/how long): Unknown Negative Consequences of Use: (unable to answer) Withdrawal Symptoms: (unable to answer)                    Sleep: Good  Appetite:  Good  Current Medications: Current Facility-Administered Medications  Medication Dose Route Frequency Provider Last Rate Last Dose  . acetaminophen (TYLENOL) tablet 650 mg  650 mg Oral Q6H PRN Kerry HoughSimon, Spencer E, PA-C      . alum & mag hydroxide-simeth (MAALOX/MYLANTA) 200-200-20 MG/5ML suspension 30 mL  30 mL Oral Q4H PRN Donell SievertSimon, Spencer E, PA-C      . ARIPiprazole ER (ABILIFY MAINTENA) injection 400 mg  400 mg Intramuscular Q28 days Malvin JohnsFarah, Brian, MD   400 mg at 03/15/18 1049  . benztropine (COGENTIN) tablet 0.5 mg  0.5 mg Oral BID Malvin JohnsFarah, Brian, MD   0.5 mg at 03/17/18 0815  . feeding supplement (ENSURE ENLIVE) (ENSURE ENLIVE) liquid 237 mL  237 mL Oral BID BM Malvin JohnsFarah, Brian, MD   237 mL at 03/16/18 1432  . hydrOXYzine (ATARAX/VISTARIL) tablet 25 mg  25 mg Oral Q6H PRN Donell SievertSimon, Spencer E, PA-C   25 mg at 03/12/18 0143  . OLANZapine zydis (ZYPREXA) disintegrating tablet 5 mg  5 mg Oral Q8H PRN Kerry HoughSimon, Spencer E, PA-C       And  . LORazepam (ATIVAN) tablet 1 mg  1 mg Oral PRN Donell SievertSimon, Spencer E, PA-C       And  . ziprasidone (GEODON) injection 20 mg  20 mg Intramuscular PRN Donell SievertSimon, Spencer E, PA-C      . magnesium hydroxide (MILK OF MAGNESIA) suspension 30 mL  30 mL Oral Daily PRN Kerry HoughSimon, Spencer E, PA-C      . multivitamin with minerals tablet 1 tablet  1 tablet Oral Daily Malvin JohnsFarah, Brian, MD   1 tablet at 03/17/18 0815  . risperiDONE (RISPERDAL) tablet 3 mg  3 mg Oral BID Malvin JohnsFarah, Brian, MD   3 mg at 03/17/18 0816  . temazepam (RESTORIL) capsule 30 mg  30 mg Oral QHS Malvin JohnsFarah, Brian, MD   30 mg at 03/16/18 2120  . traZODone (DESYREL) tablet 50 mg  50 mg Oral QHS PRN Kerry HoughSimon, Spencer E, PA-C   50 mg at 03/12/18 0143    Lab Results: No results found for this or any previous visit (from the past 48 hour(s)).  Blood Alcohol  level:  Lab Results  Component Value Date   ETH <10 03/11/2018   ETH <10 07/05/2017    Metabolic Disorder Labs: No results found for: HGBA1C, MPG No results found for: PROLACTIN No results found for: CHOL, TRIG, HDL, CHOLHDL, VLDL, LDLCALC  Physical Findings: AIMS: Facial and Oral Movements Muscles of Facial Expression: None, normal Lips  and Perioral Area: None, normal Jaw: None, normal Tongue: None, normal,Extremity Movements Upper (arms, wrists, hands, fingers): None, normal Lower (legs, knees, ankles, toes): None, normal, Trunk Movements Neck, shoulders, hips: None, normal, Overall Severity Severity of abnormal movements (highest score from questions above): None, normal Incapacitation due to abnormal movements: None, normal Patient's awareness of abnormal movements (rate only patient's report): No Awareness, Dental Status Current problems with teeth and/or dentures?: No Does patient usually wear dentures?: No  CIWA:  CIWA-Ar Total: 8 COWS:  COWS Total Score: 5  Musculoskeletal: Strength & Muscle Tone: within normal limits Gait & Station: normal Patient leans: N/A  Psychiatric Specialty Exam: Physical Exam  Nursing note and vitals reviewed. Constitutional: He is oriented to person, place, and time. He appears well-developed and well-nourished.  HENT:  Head: Normocephalic and atraumatic.  Respiratory: Effort normal.  Neurological: He is alert and oriented to person, place, and time.    ROS  Blood pressure 100/72, pulse 95, temperature 97.9 F (36.6 C), temperature source Oral, resp. rate 18, height 5\' 10"  (1.778 m), weight 68 kg, SpO2 100 %.Body mass index is 21.52 kg/m.  General Appearance: Casual  Eye Contact:  Fair  Speech:  Normal Rate  Volume:  Normal  Mood:  Euthymic  Affect:  Congruent  Thought Process:  Coherent and Descriptions of Associations: Intact  Orientation:  Full (Time, Place, and Person)  Thought Content:  Logical  Suicidal Thoughts:  No   Homicidal Thoughts:  No  Memory:  Immediate;   Fair Recent;   Fair Remote;   Fair  Judgement:  Intact  Insight:  Fair  Psychomotor Activity:  Psychomotor Retardation  Concentration:  Concentration: Fair and Attention Span: Fair  Recall:  [employer]Fair  Fund of Knowledge:  Fair  Language:  Fair  Akathisia:  Negative  Handed:  Right  AIMS (if indicated):     Assets:  Communication Skills Desire for Improvement Housing Physical Health Resilience  ADL's:  Intact  Cognition:  WNL  Sleep:  Number of Hours: 6.75     Treatment Plan Summary: Daily contact with patient to assess and evaluate symptoms and progress in treatment, Medication management and Plan Patient is seen and examined.  Patient is a 43 year old male with the above-stated past psychiatric history who is seen in follow-up patient continues to slowly improve.  He is not having any auditory or visual hallucinations .  His hygiene is improved.  I think he can probably go home either tomorrow or Wednesday.  I think probably tomorrow is more likely.  I am not going to change any of his medications today.  Hopefully things will go well tonight and he will be able to be discharged home tomorrow. 1.  Patient to continue to receive his Abilify extended release injection 400 mg IM q. 28 days for psychosis. 2.  Continue Cogentin 0.5 mg p.o. twice daily for side effects of medications. 3.  Continue Risperdal 3 mg p.o. twice daily for psychosis. 4.  Continue temazepam 30 mg p.o. nightly for insomnia. 5.  Continue trazodone 50 mg p.o. nightly as needed insomnia. 6.  Disposition planning-if all goes well he can probably be discharged tomorrow (1/21).  Antonieta PertGreg Lawson Breland Trouten, MD 03/17/2018, 3:09 PM

## 2018-03-17 NOTE — [facility] Group Notes (Signed)
[facility] LCSW Group Therapy Note  Date/Time: 03/17/18, 1300  Type of Therapy and Topic:  Group Therapy:  Overcoming Obstacles  Participation Level:  active  Description of Group:    In this group patients will be encouraged to explore what they see as obstacles to their own wellness and recovery. They will be guided to discuss their thoughts, feelings, and behaviors related to these obstacles. The group will process together ways to cope with barriers, with attention given to specific choices patients can make. Each patient will be challenged to identify changes they are motivated to make in order to overcome their obstacles. This group will be process-oriented, with patients participating in exploration of their own experiences as well as giving and receiving support and challenge from other group members.  Therapeutic Goals: 1. Patient will identify personal and current obstacles as they relate to admission. 2. Patient will identify barriers that currently interfere with their wellness or overcoming obstacles.  3. Patient will identify feelings, thought process and behaviors related to these barriers. 4. Patient will identify two changes they are willing to make to overcome these obstacles:    Summary of Patient Progress: Pt came in a little late but then did a pretty good job of participating in the group.  Pt shared that "being sensitive" and "staying to myself" are obstacles in his life and was more willing to talk during group than CSW had witnessed before.        Therapeutic Modalities:   Cognitive Behavioral Therapy Solution Focused Therapy Motivational Interviewing Relapse Prevention Therapy  Daleen Squibb, LCSW

## 2018-03-17 NOTE — Progress Notes (Signed)
D:  Bryan [name] continues to keep to himself.  Minimal interaction with staff and no interaction with peers.  He remains pleasant but guarded.  He took hs medication without difficulty.  He denied SI/HI or A/V hallucinations.  He is currently resting with his eyes closed and appears to be asleep. A:  1:1 with RN for support and encouragement.  Medications as ordered.  Q 15 minute checks maintained for safety.  Encouraged participation in group and unit activities.   R:  Bryan [name] remains safe on the unit.  We will continue to monitor the progress towards his goals.

## 2018-03-17 NOTE — Progress Notes (Signed)
Recreation Therapy Notes  Date: 1.20.20 Time: 1000 Location: 500 Hall Dayroom  Group Topic: Leisure Education  Goal Area(s) Addresses:  Patient will identify positive leisure activities.  Patient will identify one positive benefit of participation in leisure activities.   Intervention: AT[employer]White board, marker, eraser  Activity: Pictionary.  Patients were to pick a word from the container and draw in on the board.  The remainder of the group was to guess the picture.  Each person was given a time limit of one minute.  The person who guessed the drawing, would be next one up to draw a picture.  Education:  Leisure Education, Building control surveyorDischarge Planning  Education Outcome: Acknowledges education/In group clarification offered/Needs additional education  Clinical Observations/Feedback: Patient did not attend group session.     Caroll RancherMarjette Jeena Arnett, LRT/CTRS    Caroll RancherLindsay, Shaela Boer A 03/17/2018 11:52 AM

## 2018-03-17 NOTE — Tx Team (Signed)
Interdisciplinary Treatment and Diagnostic Plan Update  03/17/2018 Time of Session: 0904 [name] MRN: [id number]  Principal Diagnosis: <principal problem not specified>  Secondary Diagnoses: Active Problems:   MDD (major depressive disorder), recurrent, severe, with psychosis (HCC)   Paranoid schizophrenia (HCC)   Current Medications:  Current Facility-Administered Medications  Medication Dose Route Frequency Provider Last Rate Last Dose  . acetaminophen (TYLENOL) tablet 650 mg  650 mg Oral Q6H PRN Kerry Hough, PA-C      . alum & mag hydroxide-simeth (MAALOX/MYLANTA) 200-200-20 MG/5ML suspension 30 mL  30 mL Oral Q4H PRN Donell Sievert E, PA-C      . ARIPiprazole ER (ABILIFY MAINTENA) injection 400 mg  400 mg Intramuscular Q28 days Malvin Johns, MD   400 mg at 03/15/18 1049  . benztropine (COGENTIN) tablet 0.5 mg  0.5 mg Oral BID Malvin Johns, MD   0.5 mg at 03/17/18 0815  . feeding supplement (ENSURE ENLIVE) (ENSURE ENLIVE) liquid 237 mL  237 mL Oral BID BM Malvin Johns, MD   237 mL at 03/16/18 1432  . hydrOXYzine (ATARAX/VISTARIL) tablet 25 mg  25 mg Oral Q6H PRN Donell Sievert E, PA-C   25 mg at 03/12/18 0143  . OLANZapine zydis (ZYPREXA) disintegrating tablet 5 mg  5 mg Oral Q8H PRN Kerry Hough, PA-C       And  . LORazepam (ATIVAN) tablet 1 mg  1 mg Oral PRN Donell Sievert E, PA-C       And  . ziprasidone (GEODON) injection 20 mg  20 mg Intramuscular PRN Donell Sievert E, PA-C      . magnesium hydroxide (MILK OF MAGNESIA) suspension 30 mL  30 mL Oral Daily PRN Kerry Hough, PA-C      . multivitamin with minerals tablet 1 tablet  1 tablet Oral Daily Malvin Johns, MD   1 tablet at 03/17/18 0815  . risperiDONE (RISPERDAL) tablet 3 mg  3 mg Oral BID Malvin Johns, MD   3 mg at 03/17/18 0816  . temazepam (RESTORIL) capsule 30 mg  30 mg Oral QHS Malvin Johns, MD   30 mg at 03/16/18 2120  . traZODone (DESYREL) tablet 50 mg  50 mg Oral QHS PRN Kerry Hough, PA-C    50 mg at 03/12/18 0143   PTA Medications: Medications Prior to Admission  Medication Sig Dispense Refill Last Dose  . benztropine (COGENTIN) 1 MG tablet Take 1 tablet (1 mg total) by mouth 2 (two) times daily. For prevention of drug induced tremors (Patient not taking: Reported on 03/11/2018) 60 tablet 0 Not Taking at Unknown time  . gabapentin (NEURONTIN) 100 MG capsule Take 2 capsules (200 mg total) by mouth 2 (two) times daily. For agitation (Patient not taking: Reported on 03/11/2018) 120 capsule 0 Not Taking at Unknown time  . paliperidone (INVEGA SUSTENNA) 156 MG/ML SUSY injection Inject 1 mL (156 mg total) into the muscle every 30 (thirty) days. (Due on 08-10-17): For mood control (Patient not taking: Reported on 03/11/2018) 1.2 mL 0 Not Taking at Unknown time  . paliperidone (INVEGA) 6 MG 24 hr tablet Take 1 tablet (6 mg total) by mouth daily. For mood control (Patient not taking: Reported on 03/11/2018) 15 tablet 0 Not Taking at Unknown time  . traZODone (DESYREL) 50 MG tablet Take 1 tablet (50 mg total) by mouth at bedtime as needed for sleep. (Patient not taking: Reported on 03/11/2018) 30 tablet 0 Not Taking at Unknown time    Patient Stressors: Medication change  or noncompliance  Patient Strengths: Capable of independent living Physical Health Supportive family/friends  Treatment Modalities: Medication Management, Group therapy, Case management,  1 to 1 session with clinician, Psychoeducation, Recreational therapy.   Physician Treatment Plan for Primary Diagnosis: <principal problem not specified> Long Term Goal(s): Improvement in symptoms so as ready for discharge Improvement in symptoms so as ready for discharge   Short Term Goals: Ability to disclose and discuss suicidal ideas Ability to demonstrate self-control will improve Ability to identify and develop effective coping behaviors will improve Ability to identify and develop effective coping behaviors will  improve Compliance with prescribed medications will improve Ability to identify triggers associated with substance abuse/mental health issues will improve  Medication Management: Evaluate patient's response, side effects, and tolerance of medication regimen.  Therapeutic Interventions: 1 to 1 sessions, Unit Group sessions and Medication administration.  Evaluation of Outcomes: Progressing  Physician Treatment Plan for Secondary Diagnosis: Active Problems:   MDD (major depressive disorder), recurrent, severe, with psychosis (HCC)   Paranoid schizophrenia (HCC)  Long Term Goal(s): Improvement in symptoms so as ready for discharge Improvement in symptoms so as ready for discharge   Short Term Goals: Ability to disclose and discuss suicidal ideas Ability to demonstrate self-control will improve Ability to identify and develop effective coping behaviors will improve Ability to identify and develop effective coping behaviors will improve Compliance with prescribed medications will improve Ability to identify triggers associated with substance abuse/mental health issues will improve     Medication Management: Evaluate patient's response, side effects, and tolerance of medication regimen.  Therapeutic Interventions: 1 to 1 sessions, Unit Group sessions and Medication administration.  Evaluation of Outcomes: Progressing   RN Treatment Plan for Primary Diagnosis: <principal problem not specified> Long Term Goal(s): Knowledge of disease and therapeutic regimen to maintain health will improve  Short Term Goals: Ability to identify and develop effective coping behaviors will improve and Compliance with prescribed medications will improve  Medication Management: RN will administer medications as ordered by provider, will assess and evaluate patient's response and provide education to patient for prescribed medication. RN will report any adverse and/or side effects to prescribing  provider.  Therapeutic Interventions: 1 on 1 counseling sessions, Psychoeducation, Medication administration, Evaluate responses to treatment, Monitor vital signs and CBGs as ordered, Perform/monitor CIWA, COWS, AIMS and Fall Risk screenings as ordered, Perform wound care treatments as ordered.  Evaluation of Outcomes: Progressing   LCSW Treatment Plan for Primary Diagnosis: <principal problem not specified> Long Term Goal(s): Safe transition to appropriate next level of care at discharge, Engage patient in therapeutic group addressing interpersonal concerns.  Short Term Goals: Engage patient in aftercare planning with referrals and resources, Increase social support and Increase skills for wellness and recovery  Therapeutic Interventions: Assess for all discharge needs, 1 to 1 time with Social worker, Explore available resources and support systems, Assess for adequacy in community support network, Educate family and significant other(s) on suicide prevention, Complete Psychosocial Assessment, Interpersonal group therapy.  Evaluation of Outcomes: Progressing   Progress in Treatment: Attending groups: No. Participating in groups: No. Taking medication as prescribed: Yes Toleration medication: Yes Family/Significant other contact made: No, will contact:  pt declined consent Patient understands diagnosis: No Discussing patient identified problems/goals with staff: Yes. Medical problems stabilized or resolved: Yes. Denies suicidal/homicidal ideation: Yes. Issues/concerns per patient self-inventory: No. Other: none  New problem(s) identified: No, Describe:  none  New Short Term/Long Term Goal(s):  Patient Goals:  "stop making mistakes, get back to what  I was doing"  Discharge Plan or Barriers:   Reason for Continuation of Hospitalization: Medication stabilization Other; describe negative symptoms of schizophrenia  Estimated Length of Stay: 1-3 days.  Attendees: Patient:  03/17/2018   Physician: Dr Jola Babinskilary, MD 03/17/2018   Nursing: Joslyn Devonaroline Beaudry, RN 03/17/2018   RN Care Manager: 03/17/2018   Social Worker: Daleen SquibbGreg Gor Vestal, LCSW 03/17/2018   Recreational Therapist:  03/17/2018   Other:  03/17/2018   Other:  03/17/2018   Other: 03/17/2018           Scribe for Treatment Team: Lorri FrederickWierda, Fay Swider Jon, LCSW 03/17/2018 11:40 AM

## 2018-03-18 MED ORDER — HYDROXYZINE HCL 25 MG PO TABS: 25.0000 mg | ORAL_TABLET | Freq: Four times a day (QID) | ORAL | 0 refills | Status: DC | PRN

## 2018-03-18 MED ORDER — BENZTROPINE MESYLATE 0.5 MG PO TABS: 0.5000 mg | ORAL_TABLET | Freq: Two times a day (BID) | ORAL | 0 refills | Status: DC

## 2018-03-18 MED ORDER — TRAZODONE HCL 50 MG PO TABS: 50.0000 mg | ORAL_TABLET | Freq: Every evening | ORAL | 0 refills | Status: DC | PRN

## 2018-03-18 MED ORDER — ARIPIPRAZOLE ER 400 MG IM SRER: 400.0000 mg | INTRAMUSCULAR | 0 refills | Status: DC

## 2018-03-18 MED ORDER — RISPERIDONE 3 MG PO TABS: 3.0000 mg | ORAL_TABLET | Freq: Two times a day (BID) | ORAL | 0 refills | Status: DC

## 2018-03-18 MED ORDER — TEMAZEPAM 30 MG PO CAPS: 30.0000 mg | ORAL_CAPSULE | Freq: Every day | ORAL | 0 refills | Status: DC

## 2018-03-18 NOTE — Progress Notes (Signed)
  [facility] Adult Case Management Discharge Plan :  Will you be returning to the same living situation after discharge:  Yes,  own pt At discharge, do you have transportation home?: Yes,  TCT team will transport Do you have the ability to pay for your medications: Yes,  medicaid  Release of information consent forms completed and in the chart;  Patient's signature needed at discharge.  Patient to Follow up at: Follow-up Information    [facility]. Go on 03/18/2018.   Specialty:  Behavioral Health Why:  You will be working with the Transition Care Team, who will pick you up today and take you to your [facility] appt today at 1:00p. Estelle June, Charity fundraiser, is the team lead.  [phone]. Contact information: [address] [place] [address] [phone]           Next level of care provider has access to [facility] Link:no  Safety Planning and Suicide Prevention discussed: No.Pt declined consent.   Have you used any form of tobacco in the last 30 days? (Cigarettes, Smokeless Tobacco, Cigars, and/or Pipes): No  Has patient been referred to the Quitline?: N/A patient is not a smoker  Patient has been referred for addiction treatment: N/A  Lorri Frederick, LCSW 03/18/2018, 9:16 AM

## 2018-03-18 NOTE — Discharge Summary (Addendum)
Physician Discharge Summary Note  Patient:  Bryan [name] is an 43 y.o., male  MRN:  [id number]  DOB:  1975/07/26  Patient phone:  [phone] (home)   Patient address:   [address] [place]  [address],   Total Time spent with patient: Greater than 30 minutes  Date of Admission:  03/11/2018  Date of Discharge: 03-18-2018  Reason for Admission:   Principal Problem: Paranoid schizophrenia [facility])  Discharge Diagnoses: Patient Active Problem List   Diagnosis Date Noted  . Paranoid schizophrenia (HCC) [F20.0]     Priority: High  . Undifferentiated schizophrenia (HCC) [F20.3]     Priority: High  . MDD (major depressive disorder), recurrent, severe, with psychosis (HCC) [F33.3] 03/12/2018  . 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 Illiterate [Z55.0] 10/30/2011  . Schizoaffective disorder, depressive type (HCC) [F25.1] 10/27/2011  . Schizoaffective disorder (HCC) [F25.9] 08/10/2011  . Observed seizure-like activity (HCC) [R56.9] 08/09/2011  . PAIN IN JOINT, MULTIPLE SITES [M25.50] 02/16/2010  . FATIGUE [R53.81, R53.83] 02/16/2010  . Shortness of breath [R06.02] 02/16/2010   Past Psychiatric History: Hx. Schizoaffective disorder, Schizoaffective disorder.  Past Medical History:  Past Medical History:  Diagnosis Date  . Hypertension   . Psychiatric problem    Seen at [facility] for unknown reason. Takes seroquel. History of hearing voices.  Not commandivng voices.   . Schizophrenia (HCC)   . Tremors of nervous system     Past Surgical History:  Procedure Laterality Date  . none     Family History: History reviewed. No pertinent family history.  Family Psychiatric  History: See H&P  Social History:  Social History   Substance and Sexual Activity  Alcohol Use Yes   Comment: says drinks a 40 at least once a week. denies drink in 2-3 weeks.      Social History   Substance and Sexual Activity  Drug Use Yes  . Types: Cocaine, Marijuana   Comment: marijuana  somewhat regularly    Social History   Socioeconomic History  . Marital status: Married    Spouse name: Not on file  . Number of children: Not on file  . Years of education: Not on file  . Highest education level: Not on file  Occupational History  . Not on file  Social Needs  . Financial resource strain: Not on file  . Food insecurity:    Worry: Not on file    Inability: Not on file  . Transportation needs:    Medical: Not on file    Non-medical: Not on file  Tobacco Use  . Smoking status: Current Every Day Smoker    Packs/day: 0.50    Types: Cigarettes  . Smokeless tobacco: Never Used  Substance and Sexual Activity  . Alcohol use: Yes    Comment: says drinks a 40 at least once a week. denies drink in 2-3 weeks.   . Drug use: Yes    Types: Cocaine, Marijuana    Comment: marijuana somewhat regularly  . Sexual activity: Never  Lifestyle  . Physical activity:    Days per week: Not on file    Minutes per session: Not on file  . Stress: Not on file  Relationships  . Social connections:    Talks on phone: Not on file    Gets together: Not on file    Attends religious service: Not on file    Active member of club or organization: Not on file    Attends meetings of clubs or organizations:  Not on file    Relationship status: Not on file  Other Topics Concern  . Not on file  Social History Narrative   LIves in [place] in an apartment with his brother. Unclear why on medicaid.   Hospital Course: (Per Md's discharge SRA): Bryan [name] is a repeat admission last here about a year ago he is 43 years of age does carry diagnosis of chronic psychotic disorder/schizophrenia he required petition for involuntary commitment.  He is noncompliant with his medication regimen and as a result has been behaving erratically, not sleeping not taking care of himself not bathing so forth. There is evidence he is had recent auditory loose Nations as he has been noted to be responding to  stimuli's recently is in the emergency department however he denies this with me.  He continues to make vague statements about his condition stating he is here to get rest here to get himself together the sorts of vague statements over and over but denies any specific symptoms when questioned on review of systems or in mental status exam. Medications are listed as paliperidone 156 mg monthly, and again noncompliant since probably June, oral paliperidone 6 mg a day also noncompliant  After the above admission assessment, Bryan [name] was recommended for mood stabilization treatments. He was recommended for Q monthly antipsychotic injectable medications, He received his first dose of this medication on 03-15-18. This medication is due again on 04-12-18. Besides the use of Q monthly Abilify Maintenna 400 ER IM for mood control, Bryan [name] also was medicated, stabilized & discharged on the medications as listed below.  He was enrolled & participated in the group counseling sessions being offered & held on this unit. He learned coping skills. He presented no other significant pre-existing medical issues that required treatments. He tolerated his treatment regimen without any adverse effects or reactions reported.  As listed below on the discharge medication lists, Bryan [name] is being discharged on 2 separate antipsychotic medications (Abilify monthly injectable & Risperdal tablets). This is because patient has not been able to achieve symptoms control under an antipsychotic monotherapy. However, the combination therapy of Abilify ER 400 injectable & Risperdal tablets seem to be effective in stabilizing his symptoms at this time.  It will benefit this patient to continue on these combination antipsychotic therapies as recommended. However, as his symptoms continue to improve, patient may be titrated down to an antipsychotic monotherapy to avoid antipsychotic induced metabolic syndrome & other related adverse effects associated with  use of multiple antipsychotic therapy. This has to be done within the discretion & proper judgement of his outpatient psychiatric provider.  Today upon his discharge evaluation,Bryan [name] shares, "I'm good. I'm just ready to go." Pt denies any specific concerns. He is sleeping well. His appetite is good. He denies physical complaints. He denies SI/HI/AH/VH. He is tolerating his medication well, and he reports it has been helpful for him. He is in agreement to continue his current treatment regimen without changes. He is in agreement to have follow up with ACT team, and he plans to return to living at his apartment with his family checking in on him as well. He was able to engage in safety planning including plan to return to [facility] or contact emergency services if he feels unable to maintain his own safety or the safety of others. Pt had no further questions, comments, or concerns.  Upon discharge, Bryan [name] presented both mentally & medically stable. He is to continue mental health care & medication management on outpatient basis  as noted below. He was provided with all the necessary information needed to make this appointment without problems. [facility] in no apparent distress.  Physical Findings: AIMS: Facial and Oral Movements Muscles of Facial Expression: None, normal Lips and Perioral Area: None, normal Jaw: None, normal Tongue: None, normal,Extremity Movements Upper (arms, wrists, hands, fingers): None, normal Lower (legs, knees, ankles, toes): None, normal, Trunk Movements Neck, shoulders, hips: None, normal, Overall Severity Severity of abnormal movements (highest score from questions above): None, normal Incapacitation due to abnormal movements: None, normal Patient's awareness of abnormal movements (rate only patient's report): No Awareness, Dental Status Current problems with teeth and/or dentures?: No Does patient usually wear dentures?: No  CIWA:  CIWA-Ar  Total: 8 COWS:  COWS Total Score: 5  Musculoskeletal: Strength & Muscle Tone: within normal limits Gait & Station: normal Patient leans: N/A  Psychiatric Specialty Exam: Physical Exam  Nursing note and vitals reviewed. Constitutional: He appears well-developed.  HENT:  Head: Normocephalic.  Eyes: Pupils are equal, round, and reactive to light.  Neck: Normal range of motion.  Cardiovascular: Normal rate.  Respiratory: Effort normal.  GI: Soft.  Genitourinary:    Genitourinary Comments: Deferred   Musculoskeletal: Normal range of motion.  Neurological: He is alert.  Skin: Skin is warm.    Review of Systems  Constitutional: Negative.   HENT: Negative.   Eyes: Negative.   Respiratory: Negative.  Negative for cough and shortness of breath.   Cardiovascular: Negative.  Negative for chest pain and palpitations.  Gastrointestinal: Negative.  Negative for abdominal pain, heartburn, nausea and vomiting.  Genitourinary: Negative.   Skin: Negative.   Neurological: Negative.   Endo/Heme/Allergies: Negative.   Psychiatric/Behavioral: Positive for depression (Stable) and hallucinations (Hx. Psychosis (Stabilized with medications prior to discharge)). Negative for memory loss, substance abuse and suicidal ideas. The patient has insomnia (Syabilized with medication prior to discharge). The patient is not nervous/anxious.     Blood pressure (!) 135/101, pulse (!) 115, temperature 97.9 F (36.6 C), temperature source Oral, resp. rate 18, height 5\' 10"  (1.778 m), weight 68 kg, SpO2 100 %.Body mass index is 21.52 kg/m.  See Md's SRA   Have you used any form of tobacco in the last 30 days? (Cigarettes, Smokeless Tobacco, Cigars, and/or Pipes): No  Has this patient used any form of tobacco in the last 30 days? (Cigarettes, Smokeless Tobacco, Cigars, and/or Pipes): N/A  Blood Alcohol level:  Lab Results  Component Value Date   ETH <10 03/11/2018   ETH <10 07/05/2017   Metabolic Disorder  Labs:  No results found for: HGBA1C, MPG No results found for: PROLACTIN No results found for: CHOL, TRIG, HDL, CHOLHDL, VLDL, LDLCALC  See Psychiatric Specialty Exam and Suicide Risk Assessment completed by Attending Physician prior to discharge.  Discharge destination:  Home  Is patient on multiple antipsychotic therapies at discharge:  Yes,   Do you recommend tapering to monotherapy for antipsychotics?  Yes   Has Patient had three or more failed trials of antipsychotic monotherapy by history:  Yes,   Antipsychotic medications that previously failed include:   1.  Fluphenazine (caused tongue swelling)., 2.  Invega. and 3.  Olanzapine.  Recommended Plan for Multiple Antipsychotic Therapies: Taper to monotherapy as described:  When symptoms stabilize. This has to be done under the care of patient's outpatient provider. Discharge Instructions    Discharge instructions   Complete by:  As directed    Patient is instructed prior to  discharge to: Take all medications as prescribed by his/her mental healthcare provider. Report any adverse effects and or reactions from the medicines to his/her outpatient provider promptly. Patient has been instructed & cautioned: To not engage in alcohol and or illegal drug use while on prescription medicines. In the event of worsening symptoms, patient is instructed to call the crisis hotline, 911 and or go to the nearest ED for appropriate evaluation and treatment of symptoms. To follow-up with his/her primary care provider for your other medical issues, concerns and or health care needs.     Allergies as of 03/18/2018      Reactions   Fluphenazine Shortness Of Breath, Swelling   Tongue swelling, and shaking   Coffee Bean Extract [coffea Arabica] Swelling      Medication List    STOP taking these medications   gabapentin 100 MG capsule Commonly known as:  NEURONTIN   paliperidone 156 MG/ML Susy injection Commonly known as:  INVEGA SUSTENNA    paliperidone 6 MG 24 hr tablet Commonly known as:  INVEGA     TAKE these medications     Indication  ARIPiprazole ER 400 MG Srer injection Commonly known as:  ABILIFY MAINTENA Inject 2 mLs (400 mg total) into the muscle every 28 (twenty-eight) days. (Due on 04-12-2018): For mood control Start taking on:  April 12, 2018  Indication:  Mood control   benztropine 0.5 MG tablet Commonly known as:  COGENTIN Take 1 tablet (0.5 mg total) by mouth 2 (two) times daily. For prevention of drug induced tremors What changed:    medication strength  how much to take  Indication:  Extrapyramidal Reaction caused by Medications   hydrOXYzine 25 MG tablet Commonly known as:  ATARAX/VISTARIL Take 1 tablet (25 mg total) by mouth every 6 (six) hours as needed for anxiety.  Indication:  Feeling Anxious   risperiDONE 3 MG tablet Commonly known as:  RISPERDAL Take 1 tablet (3 mg total) by mouth 2 (two) times daily. For mood control  Indication:  Mood control   temazepam 30 MG capsule Commonly known as:  RESTORIL Take 1 capsule (30 mg total) by mouth at bedtime. For sleep  Indication:  Trouble Sleeping   traZODone 50 MG tablet Commonly known as:  DESYREL Take 1 tablet (50 mg total) by mouth at bedtime as needed for sleep.  Indication:  Trouble Sleeping      Follow-up [employer]    [facility]. Go on 03/18/2018.   Specialty:  Behavioral Health Why:  You will be working with the Transition Care Team, who will pick you up today and [facility]00[facility]. Estelle JuneMonica Wiley, Charity fundraiserN, is the team lead.  [phone]. Contact informationElpidio Eric: [address] [place] [address] [phone]          Follow-up recommendations:  Activity:  As tolerated Diet: As recommended by your primary care doctor. Keep all scheduled follow-up appointments as recommended.  Comments: Patient is instructed prior to discharge to: Take all medications as prescribed by his/her mental healthcare  provider. Report any adverse effects and or reactions from the medicines to his/her outpatient provider promptly. Patient has been instructed & cautioned: To not engage in alcohol and or illegal drug use while on prescription medicines. In the event of worsening symptoms, patient is instructed to call the crisis hotline, 911 and or go to the nearest ED for appropriate evaluation and treatment of symptoms. To follow-up with his/her primary care provider for your other medical issues,  concerns and or health care needs.   Signed: Armandina Stammer, NP, PMHNP, FNP-BC 03/18/2018, 4:33 PM    Patient seen, Suicide Assessment Completed.  Disposition Plan Reviewed

## 2018-03-18 NOTE — Progress Notes (Signed)
CSW spoke with pt, who does not have a ride home, willing to take the bus.  CSW spoke with Samanth from [facility]/T[facility].  She said that peer support worker can pick pt up from the hospital and bring him to his 1pm appt at [facility] today.   Garner Nash, MSW, LCSW Clinical Social Worker 03/18/2018 9:16 AM

## 2018-03-18 NOTE — [facility] Group Notes (Signed)
[facility] LCSW Group Therapy Note  Date/Time: 03/18/18, 1100  Type of Therapy/Topic:  Group Therapy:  Feelings about Diagnosis  Participation Level:  Did Not Attend   Mood:    Description of Group:    This group will allow patients to explore their thoughts and feelings about diagnoses they have received. Patients will be guided to explore their level of understanding and acceptance of these diagnoses. Facilitator will encourage patients to process their thoughts and feelings about the reactions of others to their diagnosis, and will guide patients in identifying ways to discuss their diagnosis with significant others in their lives. This group will be process-oriented, with patients participating in exploration of their own experiences as well as giving and receiving support and challenge from other group members.   Therapeutic Goals: 1. Patient will demonstrate understanding of diagnosis as evidence by identifying two or more symptoms of the disorder:  2. Patient will be able to express two feelings regarding the diagnosis 3. Patient will demonstrate ability to communicate their needs through discussion and/or role plays  Summary of Patient Progress:        Therapeutic Modalities:   Cognitive Behavioral Therapy Brief Therapy Feelings Identification   Greg Lollie Gunner, LCSW 

## 2018-03-18 NOTE — Progress Notes (Signed)
Recreation Therapy Notes  Date: 1.21.20 Time: 1000 Location: 500 Hall Dayroom  Group Topic: Wellness  Goal Area(s) Addresses:  Patient will define components of whole wellness. Patient will verbalize benefit of whole wellness.  Intervention:  Music   Activity: Exercise.  LRT led group in series of stretches to get them loosened up before doing the exercises.  Each would then get the opportunity to lead the group in an exercise of their choice.  Patients were allowed to take water breaks and rest as needed.  The goal is to get at least 30 minutes of physical movement.  Education:Wellness, Discharge Planning.   Education Outcome: Acknowledges education/In group clarification offered/Needs additional education.   Clinical Observations/Feedback: Pt did not attend group.    Caroll Rancher, LRT/CTRS         Caroll Rancher A 03/18/2018 11:36 AM

## 2018-03-18 NOTE — Plan of Care (Signed)
Pt did not attend group sessions.   Raden Byington, LRT/CTRS 

## 2018-03-18 NOTE — [facility] Suicide Risk Assessment (Signed)
[facility]   Principal Problem: Discharge Diagnoses: Active Problems:   MDD (major depressive disorder), recurrent, severe, with psychosis (HCC)   Paranoid schizophrenia (HCC)   Total Time spent with patient: 30 minutes  Musculoskeletal: Strength & Muscle Tone: within normal limits Gait & Station: normal Patient leans: N/A  Psychiatric Specialty Exam: ROS no headache, no chest pain, no shortness of breath, no vomiting   Blood pressure (!) 135/101, pulse (!) 115, temperature 97.9 F (36.6 C), temperature source Oral, resp. rate 18, height 5\' 10"  (1.778 m), weight 68 kg, SpO2 100 %.Body mass index is 21.52 kg/m.  General Appearance: Fairly Groomed  Patent attorneyye Contact::  Good  Speech:  Normal Rate  Volume:  Decreased  Mood:  describes mood as " pretty good", denies depression  Affect:  restricted, but smiles appropriately at times during session  Thought Process:  Linear and Descriptions of Associations: Intact  Orientation:  Other:  fully alert and attentive   Thought Content:  denies hallucinations, no delusions expressed , not internally preoccupied at this time  Suicidal Thoughts:  No denies any suicidal or self injurious ideations, denies homicidal or violent ideations  Homicidal Thoughts:  No  Memory:  recent and remote grossly intact   Judgement:  Fair/mproving   Insight:  Fair  Psychomotor Activity:  Normal- no psychomotor agitation or restlessness at this time  Concentration:  Good  Recall:  Good  Fund of Knowledge:Good  Language: Good  Akathisia:  Negative  Handed:  Right  AIMS (if indicated):   no abnormal or involuntary movements noted or reported   Assets:  Desire for Improvement Resilience  Sleep:  Number of Hours: 6.75  Cognition: WNL  ADL's:  Intact   Mental Status Per Nursing Assessment::   On Admission:  NA  Demographic Factors:  43 year old male , single, lives alone , on disability  Loss Factors: Medication non compliance    Historical Factors: History of Schizophrenia diagnosis  Risk Reduction Factors:   Positive social support and Positive coping skills or problem solving skills  Continued Clinical Symptoms:  At this time patient presents with significant improvement compared to admission. States he is feeling better and " more rested". Denies medication side effects. At present is alert, attentive, polite and calm on approach, denies depression, affect is blunted but does smile briefly/appropriately at times, no thought disorder noted at this time, denies suicidal or self injurious ideations, denies hallucinations, no delusions are currently expressed. * as reviewed with team, CSW, treatment planning is in progress and Dr. Susann Givenslary's Progress Note from 1/20, indicates plan for discharge today as patient progresses .   Cognitive Features That Contribute To Risk:  No gross cognitive deficits noted upon discharge. Is alert , attentive.  Suicide Risk:  Mild:  Suicidal ideation of limited frequency, intensity, duration, and specificity.  There are no identifiable plans, no associated intent, mild dysphoria and related symptoms, good self-control (both objective and subjective assessment), few other risk factors, and identifiable protective factors, including available and accessible social support.  Follow-up Information    [facility]. Go on 03/18/2018.   Specialty:  Behavioral Health Why:  You will be working with the Transition Care Team, who will pick you up today and take you [facility] at 1:00p. Estelle JuneMonica Wiley, Charity fundraiserN, is the team lead.  [phone]. Contact information: [address] [place] [address] [phone]           Plan Of Care/Follow-up recommendations:  Activity:  as tolerated  Diet:  heart healthy Tests:  NA Other:  See below   Patient expresses readiness for discharge. Plans to return home Plans to follow up as above .   Craige Cotta, MD 03/18/2018, 12:37 PM

## 2018-03-18 NOTE — Progress Notes (Signed)
Recreation Therapy Notes  INPATIENT RECREATION TR PLAN  Patient Details Name: Bryan [name] MRN: [id number] DOB: 07/11/1975 Today's Date: 03/18/2018  Rec Therapy Plan Is patient appropriate for Therapeutic Recreation?: Yes Treatment times per week: about 3 days Estimated Length of Stay: 5-7 days TR Treatment/Interventions: Group participation (Comment)  Discharge Criteria Pt will be discharged from therapy if:: Discharged Treatment plan/goals/alternatives discussed and agreed upon by:: Patient/family  Discharge Summary Short term goals set: See patient care plan Short term goals met: Not met Reason goals not met: Pt did not attend groups. Therapeutic equipment acquired: N/A Reason patient discharged from therapy: Discharge from hospital Pt/family agrees with progress & goals achieved: Yes Date patient discharged from therapy: 03/18/18     Victorino Sparrow, LRT/CTRS  Ria Comment, Liridona Mashaw A 03/18/2018, 11:43 AM

## 2018-03-18 NOTE — Progress Notes (Signed)
D:  Bryan [name] continues to isolate to his room.  He is minimal in his interactions but is pleasant and cooperative.  He denied any pain or discomfort and appeared to be in no physical distress.  He would only take Restoril 15mg  instead of ordered 30mg .  He is currently resting with his eyes closed and appears to be asleep. A:  1:1 with RN for support and encouragement.  Medications as ordered.  Q 15 minute checks maintained for safety.  Encouraged participation in group and unit activities.   R:  Bryan [name] remains safe on the unit.  We will continue to monitor the progress towards his goals.

## 2018-03-18 NOTE — Progress Notes (Signed)
Patient ID: Bryan [name], male   DOB: 1975-07-28, 43 y.o.   MRN: [id number] Patient denies SI, HI and AVH upon discharge.  Patient acknowledged understanding of all discharge instructions and receipt of all personal belongings.  Patient discharged to home/self care without incident.

## 2019-10-03 ENCOUNTER — Other Ambulatory Visit: Payer: Self-pay

## 2019-10-03 ENCOUNTER — Ambulatory Visit (HOSPITAL_COMMUNITY)
Admission: EM | Admit: 2019-10-03 | Discharge: 2019-10-03 | Disposition: A | Discharge status: Home / Self Care | Payer: Medicaid Other | Attending: Family Medicine | Admitting: Family Medicine

## 2019-10-03 ENCOUNTER — Encounter (HOSPITAL_COMMUNITY): Payer: Self-pay

## 2019-10-03 DIAGNOSIS — K0889 Other specified disorders of teeth and supporting structures: Secondary | ICD-10-CM

## 2019-10-03 MED ORDER — IBUPROFEN 600 MG PO TABS: 600.0000 mg | ORAL_TABLET | Freq: Three times a day (TID) | ORAL | 0 refills | Status: DC | PRN

## 2019-10-03 MED ORDER — AMOXICILLIN-POT CLAVULANATE 875-125 MG PO TABS: 1.0000 | ORAL_TABLET | Freq: Two times a day (BID) | ORAL | 0 refills | Status: DC

## 2019-10-03 MED ORDER — HYDROCODONE-ACETAMINOPHEN 5-325 MG PO TABS: 1.0000 | ORAL_TABLET | Freq: Three times a day (TID) | ORAL | 0 refills | Status: DC | PRN

## 2019-10-03 NOTE — ED Triage Notes (Signed)
Pt presents to UC for dental pain in right upper mouth. Pt states he has a wisdom tooth that is loose and needs to come out but wont. Pt states tooth is painful especially when eating. Pt denies n/v/d, fever chills or body aches.   Pt has been treating with ibuprofen, and orajel. Pt has also taken part of an amoxicillin prescription that provided relief.

## 2019-10-03 NOTE — Discharge Instructions (Addendum)
Please see the dentist on Monday  Please take the ibuprofen for moderate pain  Please take the norco for severe pain Please take the antibiotics.   Urgent tooth  Address: [address], [place], [place] [address]  Opens 7:30AM Mon Phone: [phone]

## 2019-10-03 NOTE — ED Provider Notes (Signed)
[facility]    CSN: [id number] Arrival date & time: 10/03/19  1425      History   Chief Complaint Chief Complaint  Patient presents with  . Dental Pain    HPI Bryan [name] is a 44 y.o. male.   He is presenting with tooth pain and right upper tooth.  Has been ongoing for a few days.  Has not been able to sleep for a week.  Has a history of poor dentition.  No fevers or chills.  No improvement with over-the-counter medications and therapies.  HPI  Past Medical History:  Diagnosis Date  . Hypertension   . Psychiatric problem    Seen at [facility] for unknown reason. Takes seroquel. History of hearing voices.  Not commandivng voices.   . Schizophrenia (HCC)   . Tremors of nervous system     Patient Active Problem List   Diagnosis Date Noted  . 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 Paranoid schizophrenia (HCC)   . Undifferentiated schizophrenia (HCC)   . 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 Illiterate 10/30/2011  . Schizoaffective disorder, depressive type (HCC) 10/27/2011  . Schizoaffective disorder (HCC) 08/10/2011  . Observed seizure-like activity (HCC) 08/09/2011  . PAIN IN JOINT, MULTIPLE SITES 02/16/2010  . FATIGUE 02/16/2010  . Shortness of breath 02/16/2010    Past Surgical History:  Procedure Laterality Date  . none         Home Medications    Prior to Admission medications   Medication Sig Start Date End Date Taking? Authorizing Provider  amoxicillin-clavulanate (AUGMENTIN) 875-125 MG tablet Take 1 tablet by mouth every 12 (twelve) hours. 10/03/19   Myra Rude, MD  ARIPiprazole ER (ABILIFY MAINTENA) 400 MG SRER injection Inject 2 mLs (400 mg total) into the muscle every 28 (twenty-eight) days. (Due on 04-12-2018): For mood control 04/12/18   Armandina Stammer I, NP  benztropine (COGENTIN) 0.5 MG tablet Take 1 tablet (0.5 mg total) by mouth 2 (two) times daily. For prevention of drug induced tremors  03/18/18   Armandina Stammer I, NP  HYDROcodone-acetaminophen (NORCO/VICODIN) 5-325 MG tablet Take 1 tablet by mouth every 8 (eight) hours as needed. 10/03/19   Myra Rude, MD  hydrOXYzine (ATARAX/VISTARIL) 25 MG tablet Take 1 tablet (25 mg total) by mouth every 6 (six) hours as needed for anxiety. 03/18/18   Armandina Stammer I, NP  ibuprofen (ADVIL) 600 MG tablet Take 1 tablet (600 mg total) by mouth every 8 (eight) hours as needed. 10/03/19   Myra Rude, MD  risperiDONE (RISPERDAL) 3 MG tablet Take 1 tablet (3 mg total) by mouth 2 (two) times daily. For mood control 03/18/18   Armandina Stammer I, NP  temazepam (RESTORIL) 30 MG capsule Take 1 capsule (30 mg total) by mouth at bedtime. For sleep 03/18/18   Armandina Stammer I, NP  traZODone (DESYREL) 50 MG tablet Take 1 tablet (50 mg total) by mouth at bedtime as needed for sleep. 03/18/18   Sanjuana Kava, NP    Family History History reviewed. No pertinent family history.  Social History Social History   Tobacco Use  . Smoking status: Current Every Day Smoker    Packs/day: 0.50    Types: Cigarettes  . Smokeless tobacco: Never Used  Vaping Use  . Vaping Use: Never used  Substance Use Topics  . Alcohol use: Yes    Comment: says drinks a 40 at least once a week. denies drink in 2-3 weeks.   Marland Kitchen  Drug use: Yes    Types: Cocaine, Marijuana    Comment: marijuana somewhat regularly     Allergies   Fluphenazine and Coffee bean extract [coffea arabica]   Review of Systems Review of Systems  See HPI  Physical Exam Triage Vital Signs ED Triage Vitals  Enc Vitals Group     BP 10/03/19 1537 (!) 155/85     Pulse Rate 10/03/19 1537 69     Resp 10/03/19 1537 16     Temp 10/03/19 1537 98 F (36.7 C)     Temp Source 10/03/19 1537 Oral     SpO2 10/03/19 1537 99 %     Weight --      Height --      Head Circumference --      Peak Flow --      Pain Score 10/03/19 1538 9     Pain Loc --      Pain Edu? --      Excl. in GC? --    No data  found.  Updated Vital Signs BP (!) 155/85 (BP Location: Right Arm)   Pulse 69   Temp 98 F (36.7 C) (Oral)   Resp 16   SpO2 99%   Visual Acuity Right Eye Distance:   Left Eye Distance:   Bilateral Distance:    Right Eye Near:   Left Eye Near:    Bilateral Near:     Physical Exam Gen: NAD, alert, cooperative with exam, well-appearing ENT: normal lips, normal nasal mucosa, poor dentition with several teeth missing, tenderness of the right upper tooth Eye: normal EOM, normal conjunctiva and lids Skin: no rashes, no areas of induration  Neuro: normal tone, normal sensation to touch Psych:  normal insight, alert and oriented    UC Treatments / Results  Labs (all labs ordered are listed, but only abnormal results are displayed) Labs Reviewed - No data to display  EKG   Radiology No results found.  Procedures Procedures (including critical care time)  Medications Ordered in UC Medications - No data to display  Initial Impression / Assessment and Plan / UC Course  I have reviewed the triage vital signs and the nursing notes.  Pertinent labs & imaging results that were available during my care of the patient were reviewed by me and considered in my medical decision making (see chart for details).     Mr. Jasinski is a 44 year old male that is presenting with dental pain.  Possible infection causing his pain.  Provide with ibuprofen, Norco and Augmentin.  Provided information on places for tooth extraction.  Counseled on supportive care.  Given indications to follow-up.  Final Clinical Impressions(s) / UC Diagnoses   Final diagnoses:  Pain, dental     Discharge Instructions     Please see the dentist on Monday  Please take the ibuprofen for moderate pain  Please take the norco for severe pain Please take the antibiotics.   Urgent tooth  Address: [address], [place], [place] [address]  Opens 7:30AM Mon Phone: [phone]    ED Prescriptions     Medication Sig Dispense Auth. Provider   amoxicillin-clavulanate (AUGMENTIN) 875-125 MG tablet Take 1 tablet by mouth every 12 (twelve) hours. 14 tablet Myra Rude, MD   ibuprofen (ADVIL) 600 MG tablet Take 1 tablet (600 mg total) by mouth every 8 (eight) hours as needed. 30 tablet Myra Rude, MD   HYDROcodone-acetaminophen (NORCO/VICODIN) 5-325 MG tablet Take 1 tablet by mouth every 8 (  eight) hours as needed. 12 tablet Myra Rude, MD     I have reviewed the PDMP during this encounter.   Myra Rude, MD 10/03/19 [phone]

## 2020-01-25 ENCOUNTER — Other Ambulatory Visit: Payer: Self-pay

## 2020-01-25 ENCOUNTER — Inpatient Hospital Stay (HOSPITAL_COMMUNITY)
Admission: EM | Admit: 2020-01-25 | Discharge: 2020-03-15 | DRG: 004 | Disposition: A | Discharge status: Rehabilitation Facility | Payer: Medicaid Other | Attending: Internal Medicine | Admitting: Internal Medicine

## 2020-01-25 ENCOUNTER — Inpatient Hospital Stay (HOSPITAL_COMMUNITY): Payer: Medicaid Other

## 2020-01-25 DIAGNOSIS — E781 Pure hyperglyceridemia: Secondary | ICD-10-CM | POA: Diagnosis present

## 2020-01-25 DIAGNOSIS — J189 Pneumonia, unspecified organism: Secondary | ICD-10-CM | POA: Diagnosis not present

## 2020-01-25 DIAGNOSIS — Z794 Long term (current) use of insulin: Secondary | ICD-10-CM | POA: Diagnosis not present

## 2020-01-25 DIAGNOSIS — E1010 Type 1 diabetes mellitus with ketoacidosis without coma: Secondary | ICD-10-CM | POA: Diagnosis not present

## 2020-01-25 DIAGNOSIS — Z9114 Patient's other noncompliance with medication regimen: Secondary | ICD-10-CM

## 2020-01-25 DIAGNOSIS — N170 Acute kidney failure with tubular necrosis: Secondary | ICD-10-CM | POA: Diagnosis present

## 2020-01-25 DIAGNOSIS — R1310 Dysphagia, unspecified: Secondary | ICD-10-CM | POA: Diagnosis present

## 2020-01-25 DIAGNOSIS — J9690 Respiratory failure, unspecified, unspecified whether with hypoxia or hypercapnia: Secondary | ICD-10-CM

## 2020-01-25 DIAGNOSIS — G931 Anoxic brain damage, not elsewhere classified: Secondary | ICD-10-CM | POA: Diagnosis not present

## 2020-01-25 DIAGNOSIS — Z79899 Other long term (current) drug therapy: Secondary | ICD-10-CM

## 2020-01-25 DIAGNOSIS — J9601 Acute respiratory failure with hypoxia: Secondary | ICD-10-CM | POA: Diagnosis not present

## 2020-01-25 DIAGNOSIS — Z8619 Personal history of other infectious and parasitic diseases: Secondary | ICD-10-CM | POA: Diagnosis not present

## 2020-01-25 DIAGNOSIS — R251 Tremor, unspecified: Secondary | ICD-10-CM | POA: Diagnosis present

## 2020-01-25 DIAGNOSIS — R6520 Severe sepsis without septic shock: Secondary | ICD-10-CM

## 2020-01-25 DIAGNOSIS — L89150 Pressure ulcer of sacral region, unstageable: Secondary | ICD-10-CM | POA: Diagnosis not present

## 2020-01-25 DIAGNOSIS — R6521 Severe sepsis with septic shock: Secondary | ICD-10-CM | POA: Diagnosis present

## 2020-01-25 DIAGNOSIS — A4101 Sepsis due to Methicillin susceptible Staphylococcus aureus: Secondary | ICD-10-CM | POA: Diagnosis not present

## 2020-01-25 DIAGNOSIS — Y92230 Patient room in hospital as the place of occurrence of the external cause: Secondary | ICD-10-CM | POA: Diagnosis not present

## 2020-01-25 DIAGNOSIS — E870 Hyperosmolality and hypernatremia: Secondary | ICD-10-CM | POA: Diagnosis present

## 2020-01-25 DIAGNOSIS — Z6834 Body mass index (BMI) 34.0-34.9, adult: Secondary | ICD-10-CM

## 2020-01-25 DIAGNOSIS — G928 Other toxic encephalopathy: Secondary | ICD-10-CM | POA: Diagnosis not present

## 2020-01-25 DIAGNOSIS — Z23 Encounter for immunization: Secondary | ICD-10-CM

## 2020-01-25 DIAGNOSIS — N179 Acute kidney failure, unspecified: Secondary | ICD-10-CM

## 2020-01-25 DIAGNOSIS — Z888 Allergy status to other drugs, medicaments and biological substances status: Secondary | ICD-10-CM

## 2020-01-25 DIAGNOSIS — F333 Major depressive disorder, recurrent, severe with psychotic symptoms: Secondary | ICD-10-CM | POA: Diagnosis present

## 2020-01-25 DIAGNOSIS — R0602 Shortness of breath: Secondary | ICD-10-CM

## 2020-01-25 DIAGNOSIS — I4892 Unspecified atrial flutter: Secondary | ICD-10-CM | POA: Diagnosis present

## 2020-01-25 DIAGNOSIS — E860 Dehydration: Secondary | ICD-10-CM | POA: Diagnosis not present

## 2020-01-25 DIAGNOSIS — J15211 Pneumonia due to Methicillin susceptible Staphylococcus aureus: Secondary | ICD-10-CM | POA: Diagnosis not present

## 2020-01-25 DIAGNOSIS — Z8674 Personal history of sudden cardiac arrest: Secondary | ICD-10-CM | POA: Diagnosis not present

## 2020-01-25 DIAGNOSIS — R509 Fever, unspecified: Secondary | ICD-10-CM | POA: Diagnosis not present

## 2020-01-25 DIAGNOSIS — Z20822 Contact with and (suspected) exposure to covid-19: Secondary | ICD-10-CM | POA: Diagnosis present

## 2020-01-25 DIAGNOSIS — E861 Hypovolemia: Secondary | ICD-10-CM | POA: Diagnosis present

## 2020-01-25 DIAGNOSIS — J90 Pleural effusion, not elsewhere classified: Secondary | ICD-10-CM | POA: Diagnosis not present

## 2020-01-25 DIAGNOSIS — Z9911 Dependence on respirator [ventilator] status: Secondary | ICD-10-CM

## 2020-01-25 DIAGNOSIS — H539 Unspecified visual disturbance: Secondary | ICD-10-CM | POA: Diagnosis not present

## 2020-01-25 DIAGNOSIS — E871 Hypo-osmolality and hyponatremia: Secondary | ICD-10-CM | POA: Diagnosis not present

## 2020-01-25 DIAGNOSIS — R579 Shock, unspecified: Secondary | ICD-10-CM | POA: Diagnosis not present

## 2020-01-25 DIAGNOSIS — R7989 Other specified abnormal findings of blood chemistry: Secondary | ICD-10-CM | POA: Diagnosis not present

## 2020-01-25 DIAGNOSIS — G9341 Metabolic encephalopathy: Secondary | ICD-10-CM | POA: Diagnosis not present

## 2020-01-25 DIAGNOSIS — Z01818 Encounter for other preprocedural examination: Secondary | ICD-10-CM

## 2020-01-25 DIAGNOSIS — F259 Schizoaffective disorder, unspecified: Secondary | ICD-10-CM | POA: Diagnosis not present

## 2020-01-25 DIAGNOSIS — K853 Drug induced acute pancreatitis without necrosis or infection: Secondary | ICD-10-CM | POA: Diagnosis not present

## 2020-01-25 DIAGNOSIS — E782 Mixed hyperlipidemia: Secondary | ICD-10-CM | POA: Diagnosis not present

## 2020-01-25 DIAGNOSIS — E1165 Type 2 diabetes mellitus with hyperglycemia: Secondary | ICD-10-CM | POA: Diagnosis not present

## 2020-01-25 DIAGNOSIS — E8770 Fluid overload, unspecified: Secondary | ICD-10-CM | POA: Diagnosis not present

## 2020-01-25 DIAGNOSIS — I469 Cardiac arrest, cause unspecified: Secondary | ICD-10-CM | POA: Diagnosis not present

## 2020-01-25 DIAGNOSIS — Z9119 Patient's noncompliance with other medical treatment and regimen: Secondary | ICD-10-CM | POA: Diagnosis not present

## 2020-01-25 DIAGNOSIS — L8990 Pressure ulcer of unspecified site, unspecified stage: Secondary | ICD-10-CM | POA: Diagnosis present

## 2020-01-25 DIAGNOSIS — E1110 Type 2 diabetes mellitus with ketoacidosis without coma: Secondary | ICD-10-CM | POA: Diagnosis present

## 2020-01-25 DIAGNOSIS — R609 Edema, unspecified: Secondary | ICD-10-CM | POA: Diagnosis not present

## 2020-01-25 DIAGNOSIS — Z978 Presence of other specified devices: Secondary | ICD-10-CM

## 2020-01-25 DIAGNOSIS — J690 Pneumonitis due to inhalation of food and vomit: Secondary | ICD-10-CM | POA: Diagnosis not present

## 2020-01-25 DIAGNOSIS — D72829 Elevated white blood cell count, unspecified: Secondary | ICD-10-CM

## 2020-01-25 DIAGNOSIS — R9401 Abnormal electroencephalogram [EEG]: Secondary | ICD-10-CM | POA: Diagnosis present

## 2020-01-25 DIAGNOSIS — R4182 Altered mental status, unspecified: Secondary | ICD-10-CM | POA: Diagnosis present

## 2020-01-25 DIAGNOSIS — R000 Tachycardia, unspecified: Secondary | ICD-10-CM | POA: Diagnosis not present

## 2020-01-25 DIAGNOSIS — R5381 Other malaise: Secondary | ICD-10-CM | POA: Diagnosis not present

## 2020-01-25 DIAGNOSIS — Z91018 Allergy to other foods: Secondary | ICD-10-CM

## 2020-01-25 DIAGNOSIS — Z781 Physical restraint status: Secondary | ICD-10-CM

## 2020-01-25 DIAGNOSIS — Z930 Tracheostomy status: Secondary | ICD-10-CM | POA: Diagnosis not present

## 2020-01-25 DIAGNOSIS — R140 Abdominal distension (gaseous): Secondary | ICD-10-CM

## 2020-01-25 DIAGNOSIS — K859 Acute pancreatitis without necrosis or infection, unspecified: Secondary | ICD-10-CM | POA: Diagnosis present

## 2020-01-25 DIAGNOSIS — M7989 Other specified soft tissue disorders: Secondary | ICD-10-CM | POA: Diagnosis not present

## 2020-01-25 DIAGNOSIS — E669 Obesity, unspecified: Secondary | ICD-10-CM | POA: Diagnosis present

## 2020-01-25 DIAGNOSIS — I119 Hypertensive heart disease without heart failure: Secondary | ICD-10-CM | POA: Diagnosis present

## 2020-01-25 DIAGNOSIS — F203 Undifferentiated schizophrenia: Secondary | ICD-10-CM | POA: Diagnosis not present

## 2020-01-25 DIAGNOSIS — D62 Acute posthemorrhagic anemia: Secondary | ICD-10-CM | POA: Diagnosis not present

## 2020-01-25 DIAGNOSIS — A419 Sepsis, unspecified organism: Principal | ICD-10-CM

## 2020-01-25 DIAGNOSIS — R571 Hypovolemic shock: Secondary | ICD-10-CM | POA: Diagnosis present

## 2020-01-25 DIAGNOSIS — E876 Hypokalemia: Secondary | ICD-10-CM | POA: Diagnosis not present

## 2020-01-25 DIAGNOSIS — I10 Essential (primary) hypertension: Secondary | ICD-10-CM | POA: Diagnosis not present

## 2020-01-25 DIAGNOSIS — K8500 Idiopathic acute pancreatitis without necrosis or infection: Secondary | ICD-10-CM | POA: Diagnosis not present

## 2020-01-25 DIAGNOSIS — T502X5A Adverse effect of carbonic-anhydrase inhibitors, benzothiadiazides and other diuretics, initial encounter: Secondary | ICD-10-CM | POA: Diagnosis not present

## 2020-01-25 DIAGNOSIS — F209 Schizophrenia, unspecified: Secondary | ICD-10-CM | POA: Diagnosis not present

## 2020-01-25 DIAGNOSIS — K858 Other acute pancreatitis without necrosis or infection: Secondary | ICD-10-CM | POA: Diagnosis not present

## 2020-01-25 DIAGNOSIS — L270 Generalized skin eruption due to drugs and medicaments taken internally: Secondary | ICD-10-CM | POA: Diagnosis not present

## 2020-01-25 DIAGNOSIS — O85 Puerperal sepsis: Secondary | ICD-10-CM

## 2020-01-25 DIAGNOSIS — T368X5A Adverse effect of other systemic antibiotics, initial encounter: Secondary | ICD-10-CM | POA: Diagnosis not present

## 2020-01-25 DIAGNOSIS — F17210 Nicotine dependence, cigarettes, uncomplicated: Secondary | ICD-10-CM | POA: Diagnosis present

## 2020-01-25 DIAGNOSIS — F200 Paranoid schizophrenia: Secondary | ICD-10-CM | POA: Diagnosis not present

## 2020-01-25 DIAGNOSIS — J9600 Acute respiratory failure, unspecified whether with hypoxia or hypercapnia: Secondary | ICD-10-CM

## 2020-01-25 DIAGNOSIS — E0811 Diabetes mellitus due to underlying condition with ketoacidosis with coma: Secondary | ICD-10-CM | POA: Diagnosis not present

## 2020-01-25 DIAGNOSIS — I468 Cardiac arrest due to other underlying condition: Secondary | ICD-10-CM | POA: Diagnosis not present

## 2020-01-25 DIAGNOSIS — E119 Type 2 diabetes mellitus without complications: Secondary | ICD-10-CM | POA: Diagnosis not present

## 2020-01-25 LAB — CBC
HCT: 50.3 % (ref 39.0–52.0)
Hemoglobin: 16 g/dL (ref 13.0–17.0)
MCH: 30.2 pg (ref 26.0–34.0)
MCHC: 31.8 g/dL (ref 30.0–36.0)
MCV: 94.9 fL (ref 80.0–100.0)
Platelets: 261 10*3/uL (ref 150–400)
RBC: 5.3 MIL/uL (ref 4.22–5.81)
RDW: 13 % (ref 11.5–15.5)
WBC: 15.1 10*3/uL — ABNORMAL HIGH (ref 4.0–10.5)
nRBC: 0 % (ref 0.0–0.2)

## 2020-01-25 LAB — I-STAT VENOUS BLOOD GAS, ED
Acid-base deficit: 20 mmol/L — ABNORMAL HIGH (ref 0.0–2.0)
Bicarbonate: 6.3 mmol/L — ABNORMAL LOW (ref 20.0–28.0)
Calcium, Ion: 1.07 mmol/L — ABNORMAL LOW (ref 1.15–1.40)
HCT: 48 % (ref 39.0–52.0)
Hemoglobin: 16.3 g/dL (ref 13.0–17.0)
O2 Saturation: 65 %
Potassium: 4.9 mmol/L (ref 3.5–5.1)
Sodium: 138 mmol/L (ref 135–145)
TCO2: 7 mmol/L — ABNORMAL LOW (ref 22–32)
pCO2, Ven: 18.2 mmHg — CL (ref 44.0–60.0)
pH, Ven: 7.145 — CL (ref 7.250–7.430)
pO2, Ven: 42 mmHg (ref 32.0–45.0)

## 2020-01-25 LAB — CBC WITH DIFFERENTIAL/PLATELET
Abs Immature Granulocytes: 0.13 10*3/uL — ABNORMAL HIGH (ref 0.00–0.07)
Basophils Absolute: 0 10*3/uL (ref 0.0–0.1)
Basophils Relative: 0 %
Eosinophils Absolute: 0 10*3/uL (ref 0.0–0.5)
Eosinophils Relative: 0 %
HCT: 50.1 % (ref 39.0–52.0)
Hemoglobin: 16.3 g/dL (ref 13.0–17.0)
Immature Granulocytes: 1 %
Lymphocytes Relative: 9 %
Lymphs Abs: 1.5 10*3/uL (ref 0.7–4.0)
MCH: 30.6 pg (ref 26.0–34.0)
MCHC: 32.5 g/dL (ref 30.0–36.0)
MCV: 94 fL (ref 80.0–100.0)
Monocytes Absolute: 1.5 10*3/uL — ABNORMAL HIGH (ref 0.1–1.0)
Monocytes Relative: 9 %
Neutro Abs: 13.6 10*3/uL — ABNORMAL HIGH (ref 1.7–7.7)
Neutrophils Relative %: 81 %
Platelets: 321 10*3/uL (ref 150–400)
RBC: 5.33 MIL/uL (ref 4.22–5.81)
RDW: 13 % (ref 11.5–15.5)
WBC: 16.7 10*3/uL — ABNORMAL HIGH (ref 4.0–10.5)
nRBC: 0 % (ref 0.0–0.2)

## 2020-01-25 LAB — COMPREHENSIVE METABOLIC PANEL
ALT: 36 U/L (ref 0–44)
AST: 30 U/L (ref 15–41)
Albumin: 3.2 g/dL — ABNORMAL LOW (ref 3.5–5.0)
Alkaline Phosphatase: 114 U/L (ref 38–126)
Anion gap: 33 — ABNORMAL HIGH (ref 5–15)
BUN: 76 mg/dL — ABNORMAL HIGH (ref 6–20)
CO2: 8 mmol/L — ABNORMAL LOW (ref 22–32)
Calcium: 8.7 mg/dL — ABNORMAL LOW (ref 8.9–10.3)
Chloride: 96 mmol/L — ABNORMAL LOW (ref 98–111)
Creatinine, Ser: 3.51 mg/dL — ABNORMAL HIGH (ref 0.61–1.24)
GFR, Estimated: 21 mL/min — ABNORMAL LOW (ref >60)
Glucose, Bld: 837 mg/dL (ref 70–99)
Potassium: 5.1 mmol/L (ref 3.5–5.1)
Sodium: 137 mmol/L (ref 135–145)
Total Bilirubin: 2.5 mg/dL — ABNORMAL HIGH (ref 0.3–1.2)
Total Protein: 6.4 g/dL — ABNORMAL LOW (ref 6.5–8.1)

## 2020-01-25 LAB — URINALYSIS, ROUTINE W REFLEX MICROSCOPIC
Bilirubin Urine: NEGATIVE
Glucose, UA: >=500 mg/dL — AB
Ketones, ur: 80 mg/dL — AB
Leukocytes,Ua: NEGATIVE
Nitrite: NEGATIVE
Protein, ur: 30 mg/dL — AB
Specific Gravity, Urine: 1.02 (ref 1.005–1.030)
pH: 5 (ref 5.0–8.0)

## 2020-01-25 LAB — TROPONIN I (HIGH SENSITIVITY)
Troponin I (High Sensitivity): 23 ng/L — ABNORMAL HIGH (ref <18)
Troponin I (High Sensitivity): 26 ng/L — ABNORMAL HIGH (ref <18)

## 2020-01-25 LAB — CBG MONITORING, ED
Glucose-Capillary: >600 mg/dL (ref 70–99)
Glucose-Capillary: >600 mg/dL (ref 70–99)
Glucose-Capillary: >600 mg/dL (ref 70–99)
Glucose-Capillary: >600 mg/dL (ref 70–99)
Glucose-Capillary: >600 mg/dL (ref 70–99)
Glucose-Capillary: 536 mg/dL (ref 70–99)
Glucose-Capillary: 558 mg/dL (ref 70–99)
Glucose-Capillary: 483 mg/dL — ABNORMAL HIGH (ref 70–99)
Glucose-Capillary: 484 mg/dL — ABNORMAL HIGH (ref 70–99)
Glucose-Capillary: 289 mg/dL — ABNORMAL HIGH (ref 70–99)
Glucose-Capillary: 242 mg/dL — ABNORMAL HIGH (ref 70–99)

## 2020-01-25 LAB — BASIC METABOLIC PANEL
BUN: 74 mg/dL — ABNORMAL HIGH (ref 6–20)
CO2: <7 mmol/L — ABNORMAL LOW (ref 22–32)
Calcium: 8.7 mg/dL — ABNORMAL LOW (ref 8.9–10.3)
Chloride: 102 mmol/L (ref 98–111)
Creatinine, Ser: 3.09 mg/dL — ABNORMAL HIGH (ref 0.61–1.24)
GFR, Estimated: 25 mL/min — ABNORMAL LOW (ref >60)
Glucose, Bld: 605 mg/dL (ref 70–99)
Potassium: 4.6 mmol/L (ref 3.5–5.1)
Sodium: 139 mmol/L (ref 135–145)

## 2020-01-25 LAB — BETA-HYDROXYBUTYRIC ACID
Beta-Hydroxybutyric Acid: >8 mmol/L — ABNORMAL HIGH (ref 0.05–0.27)
Beta-Hydroxybutyric Acid: >8 mmol/L — ABNORMAL HIGH (ref 0.05–0.27)

## 2020-01-25 LAB — LACTIC ACID, PLASMA
Lactic Acid, Venous: 3.2 mmol/L (ref 0.5–1.9)
Lactic Acid, Venous: 4.5 mmol/L (ref 0.5–1.9)

## 2020-01-25 LAB — HEMOGLOBIN A1C
Hgb A1c MFr Bld: 13.4 % — ABNORMAL HIGH (ref 4.8–5.6)
Mean Plasma Glucose: 337.88 mg/dL

## 2020-01-25 LAB — HIV ANTIBODY (ROUTINE TESTING W REFLEX): HIV Screen 4th Generation wRfx: NONREACTIVE

## 2020-01-25 LAB — ETHANOL: Alcohol, Ethyl (B): <10 mg/dL (ref <10)

## 2020-01-25 MED ORDER — LACTATED RINGERS IV SOLN: INTRAVENOUS | Status: DC

## 2020-01-25 MED ORDER — HALOPERIDOL LACTATE 5 MG/ML IJ SOLN
1.0000 mg | Freq: Four times a day (QID) | INTRAMUSCULAR | Status: DC | PRN
Start: 2020-01-26 — End: 2020-01-25

## 2020-01-25 MED ORDER — ENOXAPARIN SODIUM 40 MG/0.4ML ~~LOC~~ SOLN
40.0000 mg | SUBCUTANEOUS | Status: DC
  Administered 2020-01-26 – 2020-03-14 (×49): 40 mg via SUBCUTANEOUS
  Filled 2020-01-25 (×50): qty 0.4

## 2020-01-25 MED ORDER — DEXTROSE IN LACTATED RINGERS 5 % IV SOLN: INTRAVENOUS | Status: DC

## 2020-01-25 MED ORDER — SODIUM CHLORIDE 0.9 % IV SOLN: INTRAVENOUS | Status: DC

## 2020-01-25 MED ORDER — LACTATED RINGERS IV BOLUS
20.0000 mL/kg | Freq: Once | INTRAVENOUS | Status: AC
  Administered 2020-01-25: 1814 mL via INTRAVENOUS

## 2020-01-25 MED ORDER — LACTATED RINGERS IV BOLUS: 20.0000 mL/kg | Freq: Once | INTRAVENOUS | Status: DC

## 2020-01-25 MED ORDER — HALOPERIDOL LACTATE 5 MG/ML IJ SOLN
1.0000 mg | Freq: Four times a day (QID) | INTRAMUSCULAR | Status: DC | PRN
Start: 2020-01-25 — End: 2020-01-25

## 2020-01-25 MED ORDER — SODIUM CHLORIDE 0.9 % IV BOLUS
1000.0000 mL | Freq: Once | INTRAVENOUS | Status: AC
  Administered 2020-01-25: 1000 mL via INTRAVENOUS

## 2020-01-25 MED ORDER — DEXTROSE 50 % IV SOLN
0.0000 mL | INTRAVENOUS | Status: DC | PRN
  Administered 2020-02-10 – 2020-02-13 (×2): 50 mL via INTRAVENOUS
  Administered 2020-02-13 – 2020-02-19 (×2): 25 mL via INTRAVENOUS
  Filled 2020-01-25 (×3): qty 50

## 2020-01-25 MED ORDER — INSULIN REGULAR(HUMAN) IN NACL 100-0.9 UT/100ML-% IV SOLN
INTRAVENOUS | Status: DC
  Administered 2020-01-25: 8 [IU]/h via INTRAVENOUS
  Filled 2020-01-25: qty 100

## 2020-01-25 MED ORDER — HALOPERIDOL LACTATE 5 MG/ML IJ SOLN
1.0000 mg | Freq: Four times a day (QID) | INTRAMUSCULAR | Status: DC | PRN
  Administered 2020-01-26: 1 mg via INTRAVENOUS
  Administered 2020-01-26: 4 mg via INTRAVENOUS
  Filled 2020-01-25 (×2): qty 1

## 2020-01-25 MED ORDER — POTASSIUM CHLORIDE 10 MEQ/100ML IV SOLN
10.0000 meq | INTRAVENOUS | Status: AC
  Filled 2020-01-25: qty 100

## 2020-01-25 MED ORDER — INSULIN REGULAR(HUMAN) IN NACL 100-0.9 UT/100ML-% IV SOLN
INTRAVENOUS | Status: DC
  Administered 2020-01-25: 8.5 [IU]/h via INTRAVENOUS
  Administered 2020-01-27: 2.6 [IU]/h via INTRAVENOUS
  Administered 2020-01-28: 5.5 [IU]/h via INTRAVENOUS
  Filled 2020-01-25 (×3): qty 100

## 2020-01-25 MED ORDER — HALOPERIDOL LACTATE 5 MG/ML IJ SOLN
10.0000 mg | Freq: Once | INTRAMUSCULAR | Status: AC
  Administered 2020-01-25: 10 mg via INTRAVENOUS
  Filled 2020-01-25: qty 2

## 2020-01-25 MED ORDER — HALOPERIDOL LACTATE 5 MG/ML IJ SOLN
5.0000 mg | Freq: Once | INTRAMUSCULAR | Status: AC
  Administered 2020-01-25: 5 mg via INTRAVENOUS
  Filled 2020-01-25: qty 1

## 2020-01-25 MED ORDER — DEXTROSE 50 % IV SOLN: 0.0000 mL | INTRAVENOUS | Status: DC | PRN

## 2020-01-25 NOTE — ED Provider Notes (Signed)
[facility] EMERGENCY DEPARTMENT Provider Note   CSN: [id number] Arrival date & time: 01/25/20  1208     History No chief complaint on file.   Zeno Hickel is a 44 y.o. male.  Patient is a 44 year old male with past medical history of schizophrenia, depression.  He is brought today by EMS for evaluation of altered mental status.  According to his mother at bedside, patient has had a sore throat for several days and she describes him having difficulty drinking.  He became less responsive and developed rapid breathing this afternoon and the mom called 911.  Patient adds little additional history secondary to acuity of condition.  The history is provided by the patient.       Past Medical History:  Diagnosis Date  . Hypertension   . Psychiatric problem    Seen at [facility] for unknown reason. Takes seroquel. History of hearing voices.  Not commandivng voices.   . Schizophrenia (HCC)   . Tremors of nervous system     Patient Active Problem List   Diagnosis Date Noted  . 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 Paranoid schizophrenia (HCC)   . Undifferentiated schizophrenia (HCC)   . 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 Illiterate 10/30/2011  . Schizoaffective disorder, depressive type (HCC) 10/27/2011  . Schizoaffective disorder (HCC) 08/10/2011  . Observed seizure-like activity (HCC) 08/09/2011  . PAIN IN JOINT, MULTIPLE SITES 02/16/2010  . FATIGUE 02/16/2010  . Shortness of breath 02/16/2010    Past Surgical History:  Procedure Laterality Date  . none         No family history on file.  Social History   Tobacco Use  . Smoking status: Current Every Day Smoker    Packs/day: 0.50    Types: Cigarettes  . Smokeless tobacco: Never Used  Vaping Use  . Vaping Use: Never used  Substance Use Topics  . Alcohol use: Yes    Comment: says drinks a 40 at least once a week. denies drink in 2-3 weeks.   . Drug  use: Yes    Types: Cocaine, Marijuana    Comment: marijuana somewhat regularly    Home Medications Prior to Admission medications   Medication Sig Start Date End Date Taking? Authorizing Provider  amoxicillin-clavulanate (AUGMENTIN) 875-125 MG tablet Take 1 tablet by mouth every 12 (twelve) hours. 10/03/19   Myra Rude, MD  ARIPiprazole ER (ABILIFY MAINTENA) 400 MG SRER injection Inject 2 mLs (400 mg total) into the muscle every 28 (twenty-eight) days. (Due on 04-12-2018): For mood control 04/12/18   Armandina Stammer I, NP  benztropine (COGENTIN) 0.5 MG tablet Take 1 tablet (0.5 mg total) by mouth 2 (two) times daily. For prevention of drug induced tremors 03/18/18   Armandina Stammer I, NP  HYDROcodone-acetaminophen (NORCO/VICODIN) 5-325 MG tablet Take 1 tablet by mouth every 8 (eight) hours as needed. 10/03/19   Myra Rude, MD  hydrOXYzine (ATARAX/VISTARIL) 25 MG tablet Take 1 tablet (25 mg total) by mouth every 6 (six) hours as needed for anxiety. 03/18/18   Armandina Stammer I, NP  ibuprofen (ADVIL) 600 MG tablet Take 1 tablet (600 mg total) by mouth every 8 (eight) hours as needed. 10/03/19   Myra Rude, MD  risperiDONE (RISPERDAL) 3 MG tablet Take 1 tablet (3 mg total) by mouth 2 (two) times daily. For mood control 03/18/18   Armandina Stammer I, NP  temazepam (RESTORIL) 30 MG capsule Take 1 capsule (30 mg total)  by mouth at bedtime. For sleep 03/18/18   Armandina Stammer I, NP  traZODone (DESYREL) 50 MG tablet Take 1 tablet (50 mg total) by mouth at bedtime as needed for sleep. 03/18/18   Armandina Stammer I, NP    Allergies    Fluphenazine and Coffee bean extract [coffea arabica]  Review of Systems   Review of Systems  Unable to perform ROS: Acuity of condition    Physical Exam Updated Vital Signs There were no vitals taken for this visit.  Physical Exam Vitals and nursing note reviewed.  Constitutional:      General: He is not in acute distress.    Appearance: He is well-developed. He is not  diaphoretic.     Comments: Patient is a 44 year old male.  He appears acutely ill.  He is hyperventilating and is somewhat altered.  He will follow commands, but has difficulty speaking.  He moves all 4 extremities.  HENT:     Head: Normocephalic and atraumatic.     Mouth/Throat:     Comments: Tongue and mucous membranes are extremely dry. Cardiovascular:     Rate and Rhythm: Normal rate and regular rhythm.     Heart sounds: No murmur heard.  No friction rub.  Pulmonary:     Effort: Pulmonary effort is normal. No respiratory distress.     Breath sounds: Normal breath sounds. No wheezing or rales.  Abdominal:     General: Bowel sounds are normal. There is no distension.     Palpations: Abdomen is soft.     Tenderness: There is no abdominal tenderness.  Musculoskeletal:        General: Normal range of motion.     Cervical back: Normal range of motion and neck supple.  Skin:    General: Skin is warm and dry.     Comments: Skin with poor turgor.  Neurological:     Mental Status: He is alert and oriented to person, place, and time.     Coordination: Coordination normal.     ED Results / Procedures / Treatments   Labs (all labs ordered are listed, but only abnormal results are displayed) Labs Reviewed  CBG MONITORING, ED - Abnormal; Notable for the following components:      Result Value   Glucose-Capillary >600 (*)    All other components within normal limits  COMPREHENSIVE METABOLIC PANEL  CBC WITH DIFFERENTIAL/PLATELET  BETA-HYDROXYBUTYRIC ACID  LACTIC ACID, PLASMA  LACTIC ACID, PLASMA  ETHANOL  URINALYSIS, ROUTINE W REFLEX MICROSCOPIC  TROPONIN I (HIGH SENSITIVITY)    EKG EKG Interpretation  Date/Time:  Monday January 25 2020 12:22:19 EST Ventricular Rate:  176 PR Interval:    QRS Duration: 99 QT Interval:  292 QTC Calculation: 500 R Axis:   51 Text Interpretation: Atrial flutter RSR' in V1 or V2, right VCD or RVH Minimal ST elevation, inferior leads Borderline  prolonged QT interval When compared with ecg dated 03/11/2018, Atrial fibrillation has replaced sinus rhythm Confirmed by Geoffery Lyons ([id number]) on 01/25/2020 12:30:31 PM   Radiology No results found.  Procedures Procedures (including critical care time)  Medications Ordered in ED Medications  sodium chloride 0.9 % bolus 1,000 mL (has no administration in time range)  sodium chloride 0.9 % bolus 1,000 mL (has no administration in time range)    ED Course  I have reviewed the triage vital signs and the nursing notes.  Pertinent labs & imaging results that were available during my care of the patient were reviewed by me  and considered in my medical decision making (see chart for details).    MDM Rules/Calculators/A&P  Patient is a 44 year old male with history of behavioral health problems, but no prior history of diabetes.  He presents today for evaluation of altered mental status, rapid breathing, and feeling generally unwell.  He was less responsive this morning and mother had him transported for evaluation.  Patient arrives here with blood sugar greater than 600 and presumed DKA.  Patient started on IV fluids as he appears extremely dry.  Insulin drip also ordered.  At this point, metabolic panel clotted and required to be redrawn.  The results of this and venous blood gas are pending.  Patient care will be signed out to Dr. Anitra Lauth at shift change.  She will obtain the results of the studies and determine the final disposition.  CRITICAL CARE Performed by: Geoffery Lyons Total critical care time: 45 minutes Critical care time was exclusive of separately billable procedures and treating other patients. Critical care was necessary to treat or prevent imminent or life-threatening deterioration. Critical care was time spent personally by me on the following activities: development of treatment plan with patient and/or surrogate as well as nursing, discussions with consultants, evaluation  of patient's response to treatment, examination of patient, obtaining history from patient or surrogate, ordering and performing treatments and interventions, ordering and review of laboratory studies, ordering and review of radiographic studies, pulse oximetry and re-evaluation of patient's condition.   Final Clinical Impression(s) / ED Diagnoses Final diagnoses:  None    Rx / DC Orders ED Discharge Orders    None       Geoffery Lyons, MD 01/26/20 [phone]

## 2020-01-25 NOTE — Consult Note (Signed)
NAME:  Bryan [name], MRN:  [id number], DOB:  March 05, 1975, LOS: 0 ADMISSION DATE:  01/25/2020, CONSULTATION DATE:  01/25/20 REFERRING MD:  Claudette Laws CHIEF COMPLAINT:  AMS   Brief History   Bryan [name] is a 44 y.o. male who was admitted 11/29 with DKA under IMTS.  He had borderline hypotension along with agitation; therefore, PCCM asked to see in consultation.  History of present illness   Pt is encephelopathic; therefore, this HPI is obtained from chart review. Bryan [name] is a 44 y.o. male who has a PMH as outlined below (see "past medical history" for rest).  He presented to [facility] ED 11/29 with AMS.  Per pt's mother, pt had been somnolent over the past few weeks and had been experiencing polydipisia and polyuria.  A few days prior to ED presentation, he complained of sore throat which caused him to not eat or drink normally (per notes, he had not had anything to eat in 6 days).  On 11/29, he had worsened somnolence with AMS and tachypnea; therefore, EMS was called.  In ED, labs were suggestive of DKA.  He had borderline hypotension with BP 97 / 62.  He received 3L NS bolus and was started on insulin per the standard DKA protocol under IMTS.  Later that evening, he had labile BP's with SBP ranging in mid 80's to 90's.  He had an additional 3L bolus ordered.  In addition, he had agitation where he was attempting to pull at IV lines, etc.  Due to hypotension and agitation, PCCM asked to see in consultation.  He had not received any of his chronic psychiatric medications due to concern that they would depress his mental status.  Past Medical History  has PAIN IN JOINT, MULTIPLE SITES; FATIGUE; Shortness of breath; Observed seizure-like activity (HCC); Schizoaffective disorder (HCC); Schizoaffective disorder, depressive type (HCC); Illiterate; Delusional disorder (HCC); Undifferentiated schizophrenia (HCC); MDD (major depressive disorder), recurrent, severe, with psychosis (HCC); Paranoid  schizophrenia (HCC); and DKA (diabetic ketoacidosis) (HCC) on their problem list.  Significant Hospital Events   11/29 > admit.  Consults:  PCCM.  Procedures:  None.  Significant Diagnostic Tests:  CXR 11/29 > neg.  Micro Data:  None.  Antimicrobials:  None.   Interim history/subjective:  Somnolent but easily arouses to voice.  Able to tell me his name and follow basic commands.  Is agitated and tries to sit up / get out of bed.  Objective:  Blood pressure 100/71, pulse (!) 132, temperature (!) 96.1 F (35.6 C), temperature source Rectal, resp. rate (!) 30, height 5\' 10"  (1.778 m), weight 90.7 kg, SpO2 97 %.        Intake/Output Summary (Last 24 hours) at 01/25/2020 2340 Last data filed at 01/25/2020 1941 Gross per 24 hour  Intake 16 ml  Output --  Net 16 ml   Filed Weights   01/25/20 1257  Weight: 90.7 kg    Examination: General: Young adult AA male, in NAD. Neuro: Somnolent but arouses to voice.  Answers basic questions and moves all extremities. HEENT: [place]/AT. Sclerae anicteric.  MM dry.  EOMI. Cardiovascular: Tachy, regular, no M/R/G.  Lungs: Respirations shallow and rapid.  Clear bilaterally. Abdomen: BS x 4, soft, NT/ND.  Musculoskeletal: No gross deformities, no edema.  Skin: Intact, warm, no rashes.  Assessment & Plan:   DKA - likely has undiagnosed DKA given his symptoms and presentation labs. - Continue fluids and insulin per DKA protocol. - OK for SDU under IMTS as ordered. - Agree  with DM educator and needs close outpatient follow up.  AKI - presumed pre-renal from hypovolemia in setting above. - Supportive care. - Follow BMP.  Borderline hypotension - 2/2 hypovolemia 2/2 above (of note, had not eaten for 6 days prior to presentation).  Improved with fluids. - Continue fluids. - No role for ICU / pressors.  Agitation with underlying hx MDD and paranoid schizophrenia.  Received 5mg  haldol prior to me being able to see him.  Per RN, this did  help somewhat but certainly not fully. - Additional 10mg  haldol x 1 now then 1-4mg  q6hrs PRN. - Agree with safety sitter / precautions and soft restraints for now. - Resume home meds when able to take PO. - Needs close outpatient follow up (per notes, he was lost to follow up after admission to inpatient psych in Jan 2020).  Leukocytosis - presumed acute phase reactant 2/2 DKA. - Supportive care. - Assess PCT for completeness, if high then can culture and initiate abx.  Troponin bump - agree, likely demand 2/2 DKA. - Trend. - Supportive care.  Best Practice:  Diet: NPO. Pain/Anxiety/Delirium protocol (if indicated): N/A. VAP protocol (if indicated): N/A. DVT prophylaxis: SCD's / Lovenox. GI prophylaxis: N/A. Glucose control: DKA protocol. Mobility: Bedrest. Last date of multidisciplinary goals of care discussion: N/A. Family and staff present: N/A. Summary of discussion: N/A. Follow up goals of care discussion due: N/A. Code Status: Full Family Communication: Per primary. Disposition: Progressive.   Labs   CBC: Recent Labs  Lab 01/25/20 1235 01/25/20 1600 01/25/20 1738  WBC 16.7*  --  15.1*  NEUTROABS 13.6*  --   --   HGB 16.3 16.3 16.0  HCT 50.1 48.0 50.3  MCV 94.0  --  94.9  PLT 321  --  261   Basic Metabolic Panel: Recent Labs  Lab 01/25/20 1411 01/25/20 1600 01/25/20 1738  NA 137 138 139  K 5.1 4.9 4.6  CL 96*  --  102  CO2 8*  --  <7*  GLUCOSE 837*  --  605*  BUN 76*  --  74*  CREATININE 3.51*  --  3.09*  CALCIUM 8.7*  --  8.7*   GFR: Estimated Creatinine Clearance: 34.9 mL/min (A) (by C-G formula based on SCr of 3.09 mg/dL (H)). Recent Labs  Lab 01/25/20 1235 01/25/20 1738 01/25/20 1821  WBC 16.7* 15.1*  --   LATICACIDVEN 3.2*  --  4.5*   Liver Function Tests: Recent Labs  Lab 01/25/20 1411  AST 30  ALT 36  ALKPHOS 114  BILITOT 2.5*  PROT 6.4*  ALBUMIN 3.2*   No results for input(s): LIPASE, AMYLASE in the last 168 hours. No  results for input(s): AMMONIA in the last 168 hours. ABG    Component Value Date/Time   HCO3 6.3 (L) 01/25/2020 1600   TCO2 7 (L) 01/25/2020 1600   ACIDBASEDEF 20.0 (H) 01/25/2020 1600   O2SAT 65.0 01/25/2020 1600    Coagulation Profile: No results for input(s): INR, PROTIME in the last 168 hours. Cardiac Enzymes: No results for input(s): CKTOTAL, CKMB, CKMBINDEX, TROPONINI in the last 168 hours. HbA1C: Hgb A1c MFr Bld  Date/Time Value Ref Range Status  01/25/2020 05:40 PM 13.4 (H) 4.8 - 5.6 % Final    Comment:    (NOTE) Pre diabetes:          5.7%-6.4%  Diabetes:              >6.4%  Glycemic control for   <7.0% adults with  diabetes    CBG: Recent Labs  Lab 01/25/20 1904 01/25/20 1945 01/25/20 2026 01/25/20 2128 01/25/20 2233  GLUCAP 536* 484* 483* 289* 242*    Review of Systems:   Unable to obtain as pt is encephalopathic.  Past medical history  He,  has a past medical history of Hypertension, Psychiatric problem, Schizophrenia (HCC), and Tremors of nervous system.   Surgical History    Past Surgical History:  Procedure Laterality Date   none       Social History   reports that he has been smoking cigarettes. He has been smoking about 0.50 packs per day. He has never used smokeless tobacco. He reports current alcohol use. He reports current drug use. Drugs: Cocaine and Marijuana.   Family history   His family history is not on file.   Allergies Allergies  Allergen Reactions   Fluphenazine Shortness Of Breath and Swelling    Tongue swelling, and shaking   Coffee Bean Extract [Coffea Arabica] Swelling     Home meds  Prior to Admission medications   Medication Sig Start Date End Date Taking? Authorizing Provider  ARIPiprazole ER (ABILIFY MAINTENA) 400 MG SRER injection Inject 2 mLs (400 mg total) into the muscle every 28 (twenty-eight) days. (Due on 04-12-2018): For mood control 04/12/18  Yes Nwoko, Nicole Kindred I, NP  benztropine (COGENTIN) 0.5 MG tablet  Take 1 tablet (0.5 mg total) by mouth 2 (two) times daily. For prevention of drug induced tremors 03/18/18  Yes Nwoko, Nicole Kindred I, NP  hydrOXYzine (ATARAX/VISTARIL) 25 MG tablet Take 1 tablet (25 mg total) by mouth every 6 (six) hours as needed for anxiety. 03/18/18  Yes Armandina Stammer I, NP  temazepam (RESTORIL) 30 MG capsule Take 1 capsule (30 mg total) by mouth at bedtime. For sleep 03/18/18  Yes Armandina Stammer I, NP  traZODone (DESYREL) 50 MG tablet Take 1 tablet (50 mg total) by mouth at bedtime as needed for sleep. 03/18/18  Yes Armandina Stammer I, NP  HYDROcodone-acetaminophen (NORCO/VICODIN) 5-325 MG tablet Take 1 tablet by mouth every 8 (eight) hours as needed. Patient not taking: Reported on 01/25/2020 10/03/19   Myra Rude, MD  ibuprofen (ADVIL) 600 MG tablet Take 1 tablet (600 mg total) by mouth every 8 (eight) hours as needed. Patient not taking: Reported on 01/25/2020 10/03/19   Myra Rude, MD  risperiDONE (RISPERDAL) 3 MG tablet Take 1 tablet (3 mg total) by mouth 2 (two) times daily. For mood control Patient not taking: Reported on 01/25/2020 03/18/18   Sanjuana Kava, NP     Rutherford Guys, PA - [facility] 01/25/2020, 11:40 PM

## 2020-01-25 NOTE — ED Notes (Signed)
Lab reports critical lab of lactic of 3.2

## 2020-01-25 NOTE — H&P (Addendum)
Date: 01/25/2020               Patient Name:  Bryan [name] MRN: [id number]  DOB: 22-Sep-1975 Age / Sex: 45 y.o., male   PCP: Default, Provider, MD         Medical Service: Internal Medicine Teaching Service         Attending Physician: Dr. Jessy Oto, MD    First Contact: Dr. Roylene Reason, MD Pager: [phone]  Second Contact: Dr. Elige Radon, MD Pager: [phone]       After Hours (After 5p/  First Contact Pager: [phone]  weekends / holidays): Second Contact Pager: [phone]   Chief Complaint: Altered mental status  History of Present Illness: Mr. Bryan [name] is a 44 year old man with past medical history significant for schizophrenia and depression who presented to [facility] on 01/25/2020 for evaluation of altered mental status.  History collected from patient's mother as patient unable to provide significant history. She reports that over the past several weeks, patient has been very lethargic, thirsty and frequently urinating. Over the past few days, patient has complained of sore throat and lost his appetite entirely. He reportedly has not had anything to eat in the past six days. Earlier today, patient became altered with rapid breathing and not responding to questioning so his mother called EMS.   ED Course: On arrival to the ED, patient was tachycardic (171), borderline hypotensive (97/62), tachypneic (26), and saturating at 93% on room air. EKG revealed patient to be in atrial flutter. Initial labs revealed CBG of >600, beta-hydroxybutyric acid of >8.00; CMP with glucose of 837, CO2 of 8; anion gap of 33, BUN 76, Cr 3.51, Tbili 2.5, K of 5.1; VBG with pH of 7.145, pCO2 18.2, bicarbonate of 6.3; CBC with WBC of 16.7 with neutrophilic predominance 13.6; lactic acid of 3.2; troponin of 23; UA >500 glucose, large hemoglobin, 80 ketones, 30 protein. CXR was unremarkable for an acute cardiopulmonary process. He received three 1L boluses of normal saline and was started on  insulin drip and LR at 125cc/hr. He was admitted to IMTS for further evaluation and management of his diabetic ketoacidosis.  Meds: Current Meds  Medication Sig  . ARIPiprazole ER (ABILIFY MAINTENA) 400 MG SRER injection Inject 2 mLs (400 mg total) into the muscle every 28 (twenty-eight) days. (Due on 04-12-2018): For mood control  . benztropine (COGENTIN) 0.5 MG tablet Take 1 tablet (0.5 mg total) by mouth 2 (two) times daily. For prevention of drug induced tremors  . hydrOXYzine (ATARAX/VISTARIL) 25 MG tablet Take 1 tablet (25 mg total) by mouth every 6 (six) hours as needed for anxiety.  . temazepam (RESTORIL) 30 MG capsule Take 1 capsule (30 mg total) by mouth at bedtime. For sleep  . traZODone (DESYREL) 50 MG tablet Take 1 tablet (50 mg total) by mouth at bedtime as needed for sleep.   Allergies: Allergies as of 01/25/2020 - Review Complete 01/25/2020  Allergen Reaction Noted  . Fluphenazine Shortness Of Breath and Swelling 04/14/2017  . Coffee bean extract [coffea arabica] Swelling 02/16/2017   Past Medical History:  Diagnosis Date  . Hypertension   . Psychiatric problem    Seen at [facility] for unknown reason. Takes seroquel. History of hearing voices.  Not commandivng voices.   . Schizophrenia (HCC)   . Tremors of nervous system    Family History: Mother denies any family history of diabetes.  Social History: Mother reports that patient has been living with his brothers for the  last several weeks. She states that he smokes cigarettes regularly but denies alcohol or recreational drug use (though chart review indicates alcohol, cocaine and marijuana use)  Review of Systems: A complete ROS was negative except as per HPI.   Physical Exam: Blood pressure 115/90, pulse (!) 148, temperature (!) 96.1 F (35.6 C), temperature source Rectal, resp. rate (!) 30, height 5\' 10"  (1.778 m), weight 90.7 kg, SpO2 96 %. Physical Exam Vitals and nursing note reviewed. Exam conducted with a  chaperone present.  Constitutional:      General: He is in acute distress.     Appearance: He is ill-appearing and toxic-appearing.     Comments: Restless, frequently repositioning in bed and sitting up. Somewhat redirectable  HENT:     Head: Normocephalic and atraumatic.     Mouth/Throat:     Mouth: Mucous membranes are dry.     Pharynx: Oropharynx is clear.  Eyes:     Extraocular Movements: Extraocular movements intact.     Conjunctiva/sclera: Conjunctivae normal.  Cardiovascular:     Rate and Rhythm: Regular rhythm. Tachycardia present.     Pulses: Normal pulses.     Heart sounds: Normal heart sounds.  Pulmonary:     Effort: Respiratory distress present.  Abdominal:     General: Abdomen is flat. Bowel sounds are normal.     Palpations: Abdomen is soft.     Tenderness: There is no abdominal tenderness.  Musculoskeletal:        General: Normal range of motion.     Cervical back: Normal range of motion and neck supple.     Right lower leg: No edema.     Left lower leg: No edema.  Skin:    General: Skin is warm and dry.     Capillary Refill: Capillary refill takes more than 3 seconds.  Neurological:     Mental Status: He is oriented to person, place, and time.  Psychiatric:     Comments: Agitated    EKG 1: personally reviewed my interpretation is atrial flutter with a heart rate of approximately 180 EKG 2: personally reviewed my interpretation is sinus tachycardia of approximately 100  CXR: personally reviewed my interpretation is no acute cardiopulmonary process.  Assessment & Plan by Problem: Active Problems:   DKA (diabetic ketoacidosis) [facility])  Mr. Bryan [name] is a 44 year old man with past medical history significant for schizophrenia and depression who presented to [facility] on 01/25/2020 for evaluation of altered mental status.  #Diabetic ketoacidosis, active Patient with no prior history of diabetes presenting with severe hyperglycemia (837), anion gap (33),  metabolic acidosis (bicarbonate of 8, pH of 7.145), with associated Kussmaul respirations (respiratory rate of 26). Presentation is consistent with diabetic ketoacidosis. As patient is not a previously known diabetic, it is unclear the precipitating cause of this presentation, however from discussion with patient's mother, it seems that his symptoms over the past several weeks (fatigue, polydipsia, polyuria, dry mouth, anorexia) have been consistent with undiagnosed, uncontrolled diabetes. Although patient does not seem to be currently treated for his schizophrenia per chart review, metabolic syndrome secondary to an antipsychotic medication may be contributing. Alternatively, patient does have leukocytosis with neutrophilic predominance which does raise concern for an underlying infectious process. -Continue insulin drip -Continue LR 125cc/hr -Transition to D5-LR 125cc/hr when CBG <250 -D50 0-41mL PRN for hypoglycemia -NPO -HbA1c -Consult to diabetes coordinator -BMP Q4H -Beta-hydroxybutyric acid Q8H -Lactic acid (now then Q3H) -CBG monitoring -Telemetry  #AKI, active Creatinine of 3.51 elevated from baseline  of 1.20 in January of 2020. -LR 125cc/hr for now, then transition to D5-LR per above -BMP Q4H, monitor creatinine  #Elevated troponin, active Elevated troponin of 23, likely secondary to demand ischemia. -Troponin stat then Q2H -Telemetry  #Major depressive disorder, recurrent, severe, with psychosis #Paranoid schizophrenia, chronic Patient has chronic history of living with mental health disorders requiring involuntary commitment in the setting of nonadherence to prescribed antipsychotics. According to chart review, patient was discharged from inpatient psychiatric admission in January of 2020 with plan to receive monthly aripiprazole injections as well as risperidone tablets, however patient appears to have been lost to follow-up after this admission. -Holding home  benztropine -Holding home aripiprazole injections -Holding home risperidone tablets  #Insomnia, chronic Patient prescribed temazepam, trazodone and hydroxyzine for insomnia, however no follow-up since January of 2020 and PDMP does not show refill for temazepam since this time. -Holding home temazepam -Holding home trazodone -Holding home hydroxyzine  #VTE ppx: Lovenox #Code status: Full code #IVF: LR 125cc/hr #Bowel regimen: None  Dispo: Admit patient to Inpatient with expected length of stay greater than 2 midnights.  Signed: Roylene Reason, MD 01/25/2020, 6:06 PM  Pager: [phone] After 5pm on weekdays and 1pm on weekends: On Call pager: [phone]

## 2020-01-25 NOTE — Progress Notes (Addendum)
Received message from bedside RN at 2024 reporting patient's SBP falling to 80s and 70s, MAP in the low 50s. Patient writhing in bed, pulling at his lines. Mother at bedside.  Subjective: Evaluated patient at bedside. Mother also at bedside states the patient has been continuously moving in bed. States he has been responsive to his name.  Objective:   SBP 80s, P100s, RR30s, maintaining O2 saturations on room air.  General: patient writhing in bed, pulling at his IV line. Acetone aroma. HENT: mucous membranes dry CV: tachycardic, regular rhythm, no m/r/g Pulm: CTAB Skin: warm and dry Neuro: Oriented to self only. Briefly redirectable when asked to stop pulling at his lines.  S/p 3 L NS boluses. On Endotool. Most recent CBG 242 (from 558 on arrival to ED). LA 4.5 (from 3.2). Beta-hydroxybutyric acid >8.   Assessment/Plan:  Patient now oriented to self though with significant agitation, pulling at lines. Hypotensive despite 3L NS. Glucose downtrending. BHB acid still elevated. Have held off on antipsychotics due to concern of suppressing respiratory drive/mental status.  - additional 20 mL/kg LR bolus ordered - Continue Endotool - Will consult PCCM given hypotension and agitation - Sitter ordered, however told no sitters available - Nursing requests soft restraints, ordered   ADDENDUM:  Patient evaluated by PCCM who recommend continuing Insulin/fluids for DKA treatment, ordered haldol x 1 and additional PRNs, no role for ICU/pressors.  Later messaged by RN at 0000 to go to bedside as patient was reportedly unresponsive with SBP in the 70s.  S: Presented at bedside to evaluate the patient. RN reports patient has pulled out 2 IVs and has been unable to collect repeat labs except for CBG.  O:  Vitals significant for tachycardia (P 130s), tachypnea (RR 20s), BP 70s-80/50s.  Patient with two-point soft restraints on bilateral wrists. Continues to writhe/sit up periodically. Responsive to  voice. Oriented to self. Diaphoretic. Skin is warm.  As above, RN unable to collect repeat BMP, BHB, though CBG noted to have increased slightly from 242 to 278 (00:19).  A/P:  - additional 20 mL/kg LR bolus ordered - continue bilateral soft wrist restraints - continue Endotool

## 2020-01-25 NOTE — ED Notes (Signed)
Pt HR had dropped to low 100's, appears to have converted to NSR. HR remains in 115-120 range currently with Kussmal's around 28,.  Pt is extremely restless. Constantly changes position and attempts to pull at lines.

## 2020-01-25 NOTE — ED Notes (Signed)
Pt given ice per EDP 

## 2020-01-25 NOTE — ED Provider Notes (Signed)
Assumed care from Dr. Tami Ribas at 3:30 PM.  Patient's VBG with metabolic acidosis with a pH of 7.145 and CO2 of 18.  Bicarb of 6.3 and CMP showing a glucose of 837, new AKI with creatinine of 3.51 and anion gap of 33.  Patient has received 3 L of LR and is currently on an LR drip of 125.  He is started on an insulin drip after receiving fluids.  Potassium at this time is 5.1 and will hold on giving potassium infusion.  Patient will need stepdown admission.  He currently is still mentating but is still having clue small breathing.  Tachycardia remains 110-120.   Gwyneth Sprout, MD 01/25/20 1626

## 2020-01-25 NOTE — ED Triage Notes (Signed)
Pt brought in by [facility] for AMS, excessive thirst.  Pt found to be in afib/rvr with EMS and CBG of 300.  Repsonsive to voice.

## 2020-01-25 NOTE — ED Notes (Signed)
Pt BP falling to 80s and 70s; Dr. Claudette Laws advised and says is coming to bedside.

## 2020-01-26 ENCOUNTER — Inpatient Hospital Stay (HOSPITAL_COMMUNITY): Payer: Medicaid Other

## 2020-01-26 ENCOUNTER — Inpatient Hospital Stay (HOSPITAL_COMMUNITY): Payer: Medicaid Other | Admitting: Certified Registered Nurse Anesthetist

## 2020-01-26 DIAGNOSIS — I469 Cardiac arrest, cause unspecified: Secondary | ICD-10-CM | POA: Diagnosis not present

## 2020-01-26 DIAGNOSIS — E1110 Type 2 diabetes mellitus with ketoacidosis without coma: Secondary | ICD-10-CM

## 2020-01-26 LAB — BASIC METABOLIC PANEL
Anion gap: 23 — ABNORMAL HIGH (ref 5–15)
Anion gap: 15 (ref 5–15)
Anion gap: 18 — ABNORMAL HIGH (ref 5–15)
Anion gap: 15 (ref 5–15)
Anion gap: 15 (ref 5–15)
Anion gap: 18 — ABNORMAL HIGH (ref 5–15)
BUN: 88 mg/dL — ABNORMAL HIGH (ref 6–20)
BUN: 89 mg/dL — ABNORMAL HIGH (ref 6–20)
BUN: 82 mg/dL — ABNORMAL HIGH (ref 6–20)
BUN: 83 mg/dL — ABNORMAL HIGH (ref 6–20)
BUN: 86 mg/dL — ABNORMAL HIGH (ref 6–20)
BUN: 85 mg/dL — ABNORMAL HIGH (ref 6–20)
CO2: 14 mmol/L — ABNORMAL LOW (ref 22–32)
CO2: 18 mmol/L — ABNORMAL LOW (ref 22–32)
CO2: 18 mmol/L — ABNORMAL LOW (ref 22–32)
CO2: 17 mmol/L — ABNORMAL LOW (ref 22–32)
CO2: 19 mmol/L — ABNORMAL LOW (ref 22–32)
CO2: 17 mmol/L — ABNORMAL LOW (ref 22–32)
Calcium: 9.5 mg/dL (ref 8.9–10.3)
Calcium: 9.1 mg/dL (ref 8.9–10.3)
Calcium: 9 mg/dL (ref 8.9–10.3)
Calcium: 9.2 mg/dL (ref 8.9–10.3)
Calcium: 9.1 mg/dL (ref 8.9–10.3)
Calcium: 8.9 mg/dL (ref 8.9–10.3)
Chloride: 112 mmol/L — ABNORMAL HIGH (ref 98–111)
Chloride: 111 mmol/L (ref 98–111)
Chloride: 113 mmol/L — ABNORMAL HIGH (ref 98–111)
Chloride: 117 mmol/L — ABNORMAL HIGH (ref 98–111)
Chloride: 113 mmol/L — ABNORMAL HIGH (ref 98–111)
Chloride: 111 mmol/L (ref 98–111)
Creatinine, Ser: 3.28 mg/dL — ABNORMAL HIGH (ref 0.61–1.24)
Creatinine, Ser: 2.97 mg/dL — ABNORMAL HIGH (ref 0.61–1.24)
Creatinine, Ser: 2.47 mg/dL — ABNORMAL HIGH (ref 0.61–1.24)
Creatinine, Ser: 2.44 mg/dL — ABNORMAL HIGH (ref 0.61–1.24)
Creatinine, Ser: 2.76 mg/dL — ABNORMAL HIGH (ref 0.61–1.24)
Creatinine, Ser: 2.88 mg/dL — ABNORMAL HIGH (ref 0.61–1.24)
GFR, Estimated: 23 mL/min — ABNORMAL LOW (ref >60)
GFR, Estimated: 26 mL/min — ABNORMAL LOW (ref >60)
GFR, Estimated: 32 mL/min — ABNORMAL LOW (ref >60)
GFR, Estimated: 33 mL/min — ABNORMAL LOW (ref >60)
GFR, Estimated: 28 mL/min — ABNORMAL LOW (ref >60)
GFR, Estimated: 27 mL/min — ABNORMAL LOW (ref >60)
Glucose, Bld: 147 mg/dL — ABNORMAL HIGH (ref 70–99)
Glucose, Bld: 137 mg/dL — ABNORMAL HIGH (ref 70–99)
Glucose, Bld: 145 mg/dL — ABNORMAL HIGH (ref 70–99)
Glucose, Bld: 141 mg/dL — ABNORMAL HIGH (ref 70–99)
Glucose, Bld: 149 mg/dL — ABNORMAL HIGH (ref 70–99)
Glucose, Bld: 395 mg/dL — ABNORMAL HIGH (ref 70–99)
Potassium: 4.1 mmol/L (ref 3.5–5.1)
Potassium: 3.8 mmol/L (ref 3.5–5.1)
Potassium: 3.8 mmol/L (ref 3.5–5.1)
Potassium: 3.9 mmol/L (ref 3.5–5.1)
Potassium: 3.7 mmol/L (ref 3.5–5.1)
Potassium: 4.5 mmol/L (ref 3.5–5.1)
Sodium: 149 mmol/L — ABNORMAL HIGH (ref 135–145)
Sodium: 144 mmol/L (ref 135–145)
Sodium: 149 mmol/L — ABNORMAL HIGH (ref 135–145)
Sodium: 149 mmol/L — ABNORMAL HIGH (ref 135–145)
Sodium: 147 mmol/L — ABNORMAL HIGH (ref 135–145)
Sodium: 146 mmol/L — ABNORMAL HIGH (ref 135–145)

## 2020-01-26 LAB — COMPREHENSIVE METABOLIC PANEL
ALT: 50 U/L — ABNORMAL HIGH (ref 0–44)
AST: 152 U/L — ABNORMAL HIGH (ref 15–41)
Albumin: 2.2 g/dL — ABNORMAL LOW (ref 3.5–5.0)
Alkaline Phosphatase: 63 U/L (ref 38–126)
Anion gap: 22 — ABNORMAL HIGH (ref 5–15)
BUN: 89 mg/dL — ABNORMAL HIGH (ref 6–20)
CO2: 14 mmol/L — ABNORMAL LOW (ref 22–32)
Calcium: 9.7 mg/dL (ref 8.9–10.3)
Chloride: 116 mmol/L — ABNORMAL HIGH (ref 98–111)
Creatinine, Ser: 2.81 mg/dL — ABNORMAL HIGH (ref 0.61–1.24)
GFR, Estimated: 28 mL/min — ABNORMAL LOW (ref >60)
Glucose, Bld: 222 mg/dL — ABNORMAL HIGH (ref 70–99)
Potassium: 3.9 mmol/L (ref 3.5–5.1)
Sodium: 152 mmol/L — ABNORMAL HIGH (ref 135–145)
Total Bilirubin: 1.1 mg/dL (ref 0.3–1.2)
Total Protein: 4.2 g/dL — ABNORMAL LOW (ref 6.5–8.1)

## 2020-01-26 LAB — RESP PANEL BY RT-PCR (FLU A&B, COVID) ARPGX2
Influenza A by PCR: NEGATIVE
Influenza B by PCR: NEGATIVE
SARS Coronavirus 2 by RT PCR: NEGATIVE

## 2020-01-26 LAB — POCT I-STAT 7, (LYTES, BLD GAS, ICA,H+H)
Acid-base deficit: 5 mmol/L — ABNORMAL HIGH (ref 0.0–2.0)
Bicarbonate: 19.5 mmol/L — ABNORMAL LOW (ref 20.0–28.0)
Calcium, Ion: 1.33 mmol/L (ref 1.15–1.40)
HCT: 38 % — ABNORMAL LOW (ref 39.0–52.0)
Hemoglobin: 12.9 g/dL — ABNORMAL LOW (ref 13.0–17.0)
O2 Saturation: 100 %
Patient temperature: 102.3
Potassium: 4.3 mmol/L (ref 3.5–5.1)
Sodium: 148 mmol/L — ABNORMAL HIGH (ref 135–145)
TCO2: 21 mmol/L — ABNORMAL LOW (ref 22–32)
pCO2 arterial: 37.5 mmHg (ref 32.0–48.0)
pH, Arterial: 7.333 — ABNORMAL LOW (ref 7.350–7.450)
pO2, Arterial: 197 mmHg — ABNORMAL HIGH (ref 83.0–108.0)

## 2020-01-26 LAB — CBC
HCT: 47 % (ref 39.0–52.0)
HCT: 39.8 % (ref 39.0–52.0)
Hemoglobin: 16 g/dL (ref 13.0–17.0)
Hemoglobin: 14 g/dL (ref 13.0–17.0)
MCH: 30.5 pg (ref 26.0–34.0)
MCH: 31.6 pg (ref 26.0–34.0)
MCHC: 34 g/dL (ref 30.0–36.0)
MCHC: 35.2 g/dL (ref 30.0–36.0)
MCV: 89.5 fL (ref 80.0–100.0)
MCV: 89.8 fL (ref 80.0–100.0)
Platelets: 209 10*3/uL (ref 150–400)
Platelets: 149 10*3/uL — ABNORMAL LOW (ref 150–400)
RBC: 5.25 MIL/uL (ref 4.22–5.81)
RBC: 4.43 MIL/uL (ref 4.22–5.81)
RDW: 12.9 % (ref 11.5–15.5)
RDW: 13.2 % (ref 11.5–15.5)
WBC: 17.4 10*3/uL — ABNORMAL HIGH (ref 4.0–10.5)
WBC: 16.2 10*3/uL — ABNORMAL HIGH (ref 4.0–10.5)
nRBC: 0.2 % (ref 0.0–0.2)
nRBC: 0.6 % — ABNORMAL HIGH (ref 0.0–0.2)

## 2020-01-26 LAB — LACTIC ACID, PLASMA
Lactic Acid, Venous: 4.1 mmol/L (ref 0.5–1.9)
Lactic Acid, Venous: 3.3 mmol/L (ref 0.5–1.9)
Lactic Acid, Venous: 2.6 mmol/L (ref 0.5–1.9)
Lactic Acid, Venous: 4.6 mmol/L (ref 0.5–1.9)
Lactic Acid, Venous: 6.8 mmol/L (ref 0.5–1.9)
Lactic Acid, Venous: 3.7 mmol/L (ref 0.5–1.9)
Lactic Acid, Venous: 2.4 mmol/L (ref 0.5–1.9)

## 2020-01-26 LAB — OSMOLALITY: Osmolality: 359 mOsm/kg (ref 275–295)

## 2020-01-26 LAB — LIPASE, BLOOD: Lipase: 315 U/L — ABNORMAL HIGH (ref 11–51)

## 2020-01-26 LAB — BETA-HYDROXYBUTYRIC ACID
Beta-Hydroxybutyric Acid: 3.55 mmol/L — ABNORMAL HIGH (ref 0.05–0.27)
Beta-Hydroxybutyric Acid: 1.71 mmol/L — ABNORMAL HIGH (ref 0.05–0.27)
Beta-Hydroxybutyric Acid: 2.33 mmol/L — ABNORMAL HIGH (ref 0.05–0.27)

## 2020-01-26 LAB — VOLATILES,BLD-ACETONE,ETHANOL,ISOPROP,METHANOL
Acetone, blood: 0.036 g/dL — ABNORMAL HIGH (ref 0.000–0.010)
Ethanol, blood: <0.01 g/dL (ref 0.000–0.010)
Isopropanol, blood: <0.01 g/dL (ref 0.000–0.010)
Methanol, blood: <0.01 g/dL (ref 0.000–0.010)

## 2020-01-26 LAB — GLUCOSE, CAPILLARY
Glucose-Capillary: 135 mg/dL — ABNORMAL HIGH (ref 70–99)
Glucose-Capillary: 161 mg/dL — ABNORMAL HIGH (ref 70–99)
Glucose-Capillary: 182 mg/dL — ABNORMAL HIGH (ref 70–99)
Glucose-Capillary: 200 mg/dL — ABNORMAL HIGH (ref 70–99)
Glucose-Capillary: 335 mg/dL — ABNORMAL HIGH (ref 70–99)
Glucose-Capillary: 319 mg/dL — ABNORMAL HIGH (ref 70–99)
Glucose-Capillary: 365 mg/dL — ABNORMAL HIGH (ref 70–99)
Glucose-Capillary: 273 mg/dL — ABNORMAL HIGH (ref 70–99)
Glucose-Capillary: 290 mg/dL — ABNORMAL HIGH (ref 70–99)

## 2020-01-26 LAB — PROCALCITONIN: Procalcitonin: 5.93 ng/mL

## 2020-01-26 LAB — RAPID URINE DRUG SCREEN, HOSP PERFORMED
Amphetamines: NOT DETECTED
Barbiturates: NOT DETECTED
Benzodiazepines: NOT DETECTED
Cocaine: NOT DETECTED
Opiates: NOT DETECTED
Tetrahydrocannabinol: NOT DETECTED

## 2020-01-26 LAB — BLOOD GAS, ARTERIAL
Acid-base deficit: 7.6 mmol/L — ABNORMAL HIGH (ref 0.0–2.0)
Bicarbonate: 17.8 mmol/L — ABNORMAL LOW (ref 20.0–28.0)
Drawn by: 13746
FIO2: 100
O2 Saturation: 95.4 %
Patient temperature: 37
pCO2 arterial: 38.5 mmHg (ref 32.0–48.0)
pH, Arterial: 7.287 — ABNORMAL LOW (ref 7.350–7.450)
pO2, Arterial: 91.5 mmHg (ref 83.0–108.0)

## 2020-01-26 LAB — CBG MONITORING, ED
Glucose-Capillary: 103 mg/dL — ABNORMAL HIGH (ref 70–99)
Glucose-Capillary: 128 mg/dL — ABNORMAL HIGH (ref 70–99)
Glucose-Capillary: 142 mg/dL — ABNORMAL HIGH (ref 70–99)
Glucose-Capillary: 145 mg/dL — ABNORMAL HIGH (ref 70–99)
Glucose-Capillary: 173 mg/dL — ABNORMAL HIGH (ref 70–99)
Glucose-Capillary: 185 mg/dL — ABNORMAL HIGH (ref 70–99)
Glucose-Capillary: 278 mg/dL — ABNORMAL HIGH (ref 70–99)
Glucose-Capillary: 356 mg/dL — ABNORMAL HIGH (ref 70–99)
Glucose-Capillary: 98 mg/dL (ref 70–99)

## 2020-01-26 LAB — SALICYLATE LEVEL: Salicylate Lvl: <7 mg/dL — ABNORMAL LOW (ref 7.0–30.0)

## 2020-01-26 LAB — MRSA PCR SCREENING: MRSA by PCR: NEGATIVE

## 2020-01-26 LAB — TROPONIN I (HIGH SENSITIVITY): Troponin I (High Sensitivity): 101 ng/L (ref <18)

## 2020-01-26 MED ORDER — SODIUM BICARBONATE 8.4 % IV SOLN
100.0000 meq | Freq: Once | INTRAVENOUS | Status: AC
  Administered 2020-01-26: 100 meq via INTRAVENOUS
  Filled 2020-01-26: qty 50

## 2020-01-26 MED ORDER — VASOPRESSIN 20 UNITS/100 ML INFUSION FOR SHOCK
0.0300 [IU]/min | INTRAVENOUS | Status: DC
  Administered 2020-01-26 – 2020-01-30 (×9): 0.03 [IU]/min via INTRAVENOUS
  Filled 2020-01-26 (×9): qty 100

## 2020-01-26 MED ORDER — PROPOFOL 1000 MG/100ML IV EMUL
INTRAVENOUS | Status: AC
  Filled 2020-01-26: qty 100

## 2020-01-26 MED ORDER — LACTATED RINGERS IV BOLUS
1000.0000 mL | Freq: Once | INTRAVENOUS | Status: AC
  Administered 2020-01-26: 1000 mL via INTRAVENOUS

## 2020-01-26 MED ORDER — LORAZEPAM 2 MG/ML IJ SOLN
INTRAMUSCULAR | Status: AC
  Filled 2020-01-26: qty 1

## 2020-01-26 MED ORDER — FENTANYL CITRATE (PF) 100 MCG/2ML IJ SOLN
50.0000 ug | INTRAMUSCULAR | Status: DC | PRN
  Administered 2020-02-01: 50 ug via INTRAVENOUS

## 2020-01-26 MED ORDER — PROPOFOL 1000 MG/100ML IV EMUL
0.0000 ug/kg/min | INTRAVENOUS | Status: DC
  Administered 2020-01-26 – 2020-01-27 (×2): 25 ug/kg/min via INTRAVENOUS
  Filled 2020-01-26 (×3): qty 100

## 2020-01-26 MED ORDER — SODIUM CHLORIDE 0.9 % IV SOLN
2.0000 g | Freq: Two times a day (BID) | INTRAVENOUS | Status: DC
  Administered 2020-01-26 – 2020-02-01 (×14): 2 g via INTRAVENOUS
  Filled 2020-01-26 (×15): qty 2

## 2020-01-26 MED ORDER — EPINEPHRINE HCL 5 MG/250ML IV SOLN IN NS
5.0000 ug/min | INTRAVENOUS | Status: DC
  Administered 2020-01-26: 19:00:00 5 ug/min via INTRAVENOUS
  Filled 2020-01-26: qty 250

## 2020-01-26 MED ORDER — INSULIN DETEMIR 100 UNIT/ML ~~LOC~~ SOLN
8.0000 [IU] | Freq: Once | SUBCUTANEOUS | Status: AC
  Administered 2020-01-26: 8 [IU] via SUBCUTANEOUS
  Filled 2020-01-26 (×2): qty 0.08

## 2020-01-26 MED ORDER — STERILE WATER FOR INJECTION IV SOLN
INTRAVENOUS | Status: DC
  Filled 2020-01-26 (×2): qty 850

## 2020-01-26 MED ORDER — SODIUM BICARBONATE 8.4 % IV SOLN
INTRAVENOUS | Status: AC
  Filled 2020-01-26: qty 50

## 2020-01-26 MED ORDER — FENTANYL CITRATE (PF) 100 MCG/2ML IJ SOLN
50.0000 ug | INTRAMUSCULAR | Status: DC | PRN
  Administered 2020-01-29: 200 ug via INTRAVENOUS
  Administered 2020-01-29: 100 ug via INTRAVENOUS
  Administered 2020-01-30 (×2): 200 ug via INTRAVENOUS
  Administered 2020-01-30: 150 ug via INTRAVENOUS
  Administered 2020-01-30: 200 ug via INTRAVENOUS
  Administered 2020-01-30 – 2020-01-31 (×2): 100 ug via INTRAVENOUS
  Administered 2020-01-31: 150 ug via INTRAVENOUS
  Administered 2020-01-31 – 2020-02-01 (×2): 100 ug via INTRAVENOUS

## 2020-01-26 MED ORDER — DOCUSATE SODIUM 50 MG/5ML PO LIQD
100.0000 mg | Freq: Two times a day (BID) | ORAL | Status: DC
  Administered 2020-01-26 – 2020-03-02 (×55): 100 mg
  Filled 2020-01-26 (×62): qty 10

## 2020-01-26 MED ORDER — POLYETHYLENE GLYCOL 3350 17 G PO PACK
17.0000 g | PACK | Freq: Every day | ORAL | Status: DC
  Administered 2020-01-27 – 2020-02-04 (×9): 17 g
  Filled 2020-01-26 (×9): qty 1

## 2020-01-26 MED ORDER — LACTATED RINGERS IV BOLUS
20.0000 mL/kg | Freq: Once | INTRAVENOUS | Status: AC
  Administered 2020-01-26: 1814 mL via INTRAVENOUS

## 2020-01-26 MED ORDER — VANCOMYCIN VARIABLE DOSE PER UNSTABLE RENAL FUNCTION (PHARMACIST DOSING): Status: DC

## 2020-01-26 MED ORDER — SODIUM CHLORIDE 0.9 % IV SOLN
250.0000 mL | INTRAVENOUS | Status: DC
  Administered 2020-02-08: 18:00:00 250 mL via INTRAVENOUS

## 2020-01-26 MED ORDER — NOREPINEPHRINE 4 MG/250ML-% IV SOLN
0.0000 ug/min | INTRAVENOUS | Status: DC
  Administered 2020-01-26: 40 ug/min via INTRAVENOUS
  Administered 2020-01-26: 25 ug/min via INTRAVENOUS
  Administered 2020-01-26: 32 ug/min via INTRAVENOUS
  Administered 2020-01-27: 40 ug/min via INTRAVENOUS
  Filled 2020-01-26 (×4): qty 250

## 2020-01-26 MED ORDER — VANCOMYCIN HCL 2000 MG/400ML IV SOLN
2000.0000 mg | Freq: Once | INTRAVENOUS | Status: AC
  Administered 2020-01-26: 2000 mg via INTRAVENOUS
  Filled 2020-01-26: qty 400

## 2020-01-26 MED ORDER — PANTOPRAZOLE SODIUM 40 MG IV SOLR
40.0000 mg | INTRAVENOUS | Status: DC
  Administered 2020-01-26 – 2020-01-29 (×4): 40 mg via INTRAVENOUS
  Filled 2020-01-26 (×4): qty 40

## 2020-01-26 NOTE — Progress Notes (Signed)
Pt transferred to 2W from ED today around 1300. Pt alert to self and place only, on room air upon arrival from ED. Bilateral soft wrist restraints in place along with pt safety sitter attendant at bedside. Pt tachycardic with soft BPs. MD Roylene Reason contacted twice regarding pt's soft BPs of 70s/40s and tachycardia to the 140s, 2 IVFB of LR were given. Pt on an insulin gtt and upon assessment to adjust insulin gtt, pt's HR noted to be in the 50s. and at that time, pt's eyes rolled in the back of his head and he stopped breathing. Pt without a pulse at that time, code blue initiated and immediately started compressions. See code sheet for interventions during code. ROSC obtained and pt transferred to 27M01. [name] visiting updated by team at bedside. Report given to 27M RN.

## 2020-01-26 NOTE — Procedures (Signed)
Arterial Catheter Insertion Procedure Note  Bryan [name]  [id number]  May 24, 1975  Date:01/26/20  Time:5:47 PM    Provider Performing: Tobey Grim    Procedure: Insertion of Arterial Line (49753) with US guidance (00511)   Indication(s) Blood pressure monitoring and/or need for frequent ABGs  Consent Risks of the procedure as well as the alternatives and risks of each were explained to the patient and/or caregiver.  Consent for the procedure was obtained and is signed in the bedside chart  Anesthesia None   Time Out Verified patient identification, verified procedure, site/side was marked, verified correct patient position, special equipment/implants available, medications/allergies/relevant history reviewed, required imaging and test results available.   Sterile Technique Maximal sterile technique including full sterile barrier drape, hand hygiene, sterile gown, sterile gloves, mask, hair covering, sterile ultrasound probe cover (if used).   Procedure Description Area of catheter insertion was cleaned with chlorhexidine and draped in sterile fashion. With real-time ultrasound guidance an arterial catheter was placed into the left radial artery.  Appropriate arterial tracings confirmed on monitor.     Complications/Tolerance None; patient tolerated the procedure well.   EBL Minimal   Specimen(s) None

## 2020-01-26 NOTE — Progress Notes (Signed)
NAME:  Bryan [name], MRN:  [id number], DOB:  [birth year], LOS: 1 ADMISSION DATE:  01/25/2020, CONSULTATION DATE:  01/25/20 REFERRING MD:  Claudette Laws CHIEF COMPLAINT:  AMS   Brief History   Bryan [name] is a 44 y.o. male who was admitted 11/29 with DKA under IMTS.  He had borderline hypotension along with agitation; therefore, PCCM asked to see in consultation.  History of present illness   Pt is encephelopathic; therefore, this HPI is obtained from chart review. Bryan [name] is a 44 y.o. male who has a PMH as outlined below (see "past medical history" for rest).  He presented to [facility] ED 11/29 with AMS.  Per pt's mother, pt had been somnolent over the past few weeks and had been experiencing polydipisia and polyuria.  A few days prior to ED presentation, he complained of sore throat which caused him to not eat or drink normally (per notes, he had not had anything to eat in 6 days).  On 11/29, he had worsened somnolence with AMS and tachypnea; therefore, EMS was called.  In ED, labs were suggestive of DKA.  He had borderline hypotension with BP 97 / 62.  He received 3L NS bolus and was started on insulin per the standard DKA protocol under IMTS.  Later that evening, he had labile BP's with SBP ranging in mid 80's to 90's.  He had an additional 3L bolus ordered.  In addition, he had agitation where he was attempting to pull at IV lines, etc.  Due to hypotension and agitation, PCCM asked to see in consultation.  He had not received any of his chronic psychiatric medications due to concern that they would depress his mental status.  01/26/2020 evaluated in the ER no acute distress.  Continue current interventions pulmonary critical care will be available as needed.  Past Medical History  has PAIN IN JOINT, MULTIPLE SITES; FATIGUE; Shortness of breath; Observed seizure-like activity (HCC); Schizoaffective disorder (HCC); Schizoaffective disorder, depressive type (HCC); Illiterate; Delusional disorder  (HCC); Undifferentiated schizophrenia (HCC); MDD (major depressive disorder), recurrent, severe, with psychosis (HCC); Paranoid schizophrenia (HCC); and DKA (diabetic ketoacidosis) (HCC) on their problem list.  Significant Hospital Events   11/29 > admit.  Consults:  PCCM.  Procedures:  None.  Significant Diagnostic Tests:  CXR 11/29 > neg.  Micro Data:   Antimicrobials:  01/26/2020 cefepime 09/25/2019 vancomycin  Interim history/subjective:  Somewhat somnolent intermittently agitated Objective:  Blood pressure (!) 85/63, pulse (!) 130, temperature (!) 97 F (36.1 C), temperature source Rectal, resp. rate (!) 30, height 5\' 10"  (1.778 m), weight 90.7 kg, SpO2 98 %.        Intake/Output Summary (Last 24 hours) at 01/26/2020 0900 Last data filed at 01/26/2020 0450 Gross per 24 hour  Intake 6369 ml  Output --  Net 6369 ml   Filed Weights   01/25/20 1257  Weight: 90.7 kg    Examination: General: Well-nourished well-developed male is mildly agitated at time of examination HEENT: Extremely dry oral mucosa Neuro: Follows simple commands but is agitated and confused CV: Heart sounds are regular PULM: Decreased in the bases GI: soft, bsx4 active  GU: Amber urine Extremities: warm/dry,  edema  Skin: no rashes or lesions   Assessment & Plan:   DKA - likely has undiagnosed DKA given his symptoms and presentation labs.  Continue DKA protocol Admit to stepdown unit with internal medicine teaching service He will need close supervision due to his underlying mental health issues without hospital    Lab  Results  Component Value Date   CREATININE 2.47 (H) 01/26/2020   CREATININE 2.97 (H) 01/26/2020   CREATININE 3.28 (H) 01/26/2020    Continue to monitor Avoid nephro toxins Continue to hydrate  Borderline hypotension - 2/2 hypovolemia 2/2 above (of note, had not eaten for 6 days prior to presentation).   Appears dehydrated therefore increased IV fluids x1 1000 cc  ns No role for pressors or ICU  Agitation with underlying hx MDD and paranoid schizophrenia.  Received 5mg  haldol prior to me being able to see him.  Haldol as needed He will need close follow-up due to compliance issues Resume home antipsychotropic medications    Leukocytosis - presumed acute phase reactant 2/2 DKA. Currently on vancomycin and cefepime Monitor culture data  Troponin bump - agree, likely demand 2/2 DKA. Continue to monitor troponin   Best Practice:  Diet: NPO. Pain/Anxiety/Delirium protocol (if indicated): N/A. VAP protocol (if indicated): N/A. DVT prophylaxis: SCD's / Lovenox. GI prophylaxis: N/A. Glucose control: DKA protocol. Mobility: Bedrest. Last date of multidisciplinary goals of care discussion: N/A. Family and staff present: N/A. Summary of discussion: N/A. Follow up goals of care discussion due: N/A. Code Status: Full Family Communication: Per primary. Disposition: Progressive.   Labs   CBC: Recent Labs  Lab 01/25/20 1235 01/25/20 1600 01/25/20 1738 01/26/20 0345  WBC 16.7*  --  15.1* 17.4*  NEUTROABS 13.6*  --   --   --   HGB 16.3 16.3 16.0 16.0  HCT 50.1 48.0 50.3 47.0  MCV 94.0  --  94.9 89.5  PLT 321  --  261 209   Basic Metabolic Panel: Recent Labs  Lab 01/25/20 1411 01/25/20 1411 01/25/20 1600 01/25/20 1738 01/26/20 0104 01/26/20 0345 01/26/20 0723  NA 137   < > 138 139 149* 144 149*  K 5.1   < > 4.9 4.6 4.1 3.8 3.8  CL 96*  --   --  102 112* 111 113*  CO2 8*  --   --  <7* 14* 18* 18*  GLUCOSE 837*  --   --  605* 147* 137* 145*  BUN 76*  --   --  74* 88* 89* 82*  CREATININE 3.51*  --   --  3.09* 3.28* 2.97* 2.47*  CALCIUM 8.7*  --   --  8.7* 9.5 9.1 9.0   < > = values in this interval not displayed.   GFR: Estimated Creatinine Clearance: 43.7 mL/min (A) (by C-G formula based on SCr of 2.47 mg/dL (H)). Recent Labs  Lab 01/25/20 1235 01/25/20 1738 01/25/20 1821 01/26/20 0104 01/26/20 0345   PROCALCITON  --   --   --  5.93  --   WBC 16.7* 15.1*  --   --  17.4*  LATICACIDVEN 3.2*  --  4.5* 4.1* 3.3*   Liver Function Tests: Recent Labs  Lab 01/25/20 1411  AST 30  ALT 36  ALKPHOS 114  BILITOT 2.5*  PROT 6.4*  ALBUMIN 3.2*   No results for input(s): LIPASE, AMYLASE in the last 168 hours. No results for input(s): AMMONIA in the last 168 hours. ABG    Component Value Date/Time   HCO3 6.3 (L) 01/25/2020 1600   TCO2 7 (L) 01/25/2020 1600   ACIDBASEDEF 20.0 (H) 01/25/2020 1600   O2SAT 65.0 01/25/2020 1600    Coagulation Profile: No results for input(s): INR, PROTIME in the last 168 hours. Cardiac Enzymes: No results for input(s): CKTOTAL, CKMB, CKMBINDEX, TROPONINI in the last 168 hours. HbA1C:  Hgb A1c MFr Bld  Date/Time Value Ref Range Status  01/25/2020 05:40 PM 13.4 (H) 4.8 - 5.6 % Final    Comment:    (NOTE) Pre diabetes:          5.7%-6.4%  Diabetes:              >6.4%  Glycemic control for   <7.0% adults with diabetes    CBG: Recent Labs  Lab 01/26/20 0213 01/26/20 0412 01/26/20 0602 01/26/20 0712 01/26/20 0811  GLUCAP 103* 356* 98 173* 145*      Steve Daron Stutz ACNP Acute Care Nurse Practitioner [facility]/Critical [facility] Please consult Amion 01/26/2020, 9:00 AM

## 2020-01-26 NOTE — Progress Notes (Addendum)
Inpatient Diabetes Program Recommendations  AACE/ADA: New Consensus Statement on Inpatient Glycemic Control (2015)  Target Ranges:  Prepandial:   less than 140 mg/dL      Peak postprandial:   less than 180 mg/dL (1-2 hours)      Critically ill patients:  140 - 180 mg/dL   Lab Results  Component Value Date   GLUCAP 145 (H) 01/26/2020   HGBA1C 13.4 (H) 01/25/2020    Review of Glycemic Control Results for GUILFORD, SHANNAHAN (MRN [id number]) as of 01/26/2020 09:15  Ref. Range 01/26/2020 06:02 01/26/2020 07:12 01/26/2020 08:11  Glucose-Capillary Latest Ref Range: 70 - 99 mg/dL 98 034 (H) 035 (H)   Diabetes history: New onset?  Outpatient Diabetes medications: none Current orders for Inpatient glycemic control: IV insulin  Inpatient Diabetes Program Recommendations:    Noted pending beta hydroxybutyric acid. Plan to continue IV insulin pending results.  Will plan to see patient once AMS improves.   Addendum @1203 : Once ready to transition could consider: - Novolog 0-9 units Q4H - Levemir 10 units two hours prior to discontinuation of IV insulin, then QD to follow.  Thanks, , MSN, RNC-OB Diabetes Coordinator [phone] (8a-5p)

## 2020-01-26 NOTE — Anesthesia Procedure Notes (Signed)
Procedure Name: Intubation Date/Time: 01/26/2020 4:00 PM Performed by: Epifanio Lesches, CRNA Pre-anesthesia Checklist: Patient identified, Emergency Drugs available, Suction available and Patient being monitored Patient Re-evaluated:Patient Re-evaluated prior to induction Oxygen Delivery Method: Ambu bag Preoxygenation: Pre-oxygenation with 100% oxygen Ventilation: Mask ventilation without difficulty Laryngoscope Size: Glidescope and 4 Grade View: Grade I Tube type: Oral Tube size: 7.5 mm Number of attempts: 1 Airway Equipment and Method: Stylet and Oral airway Placement Confirmation: ETT inserted through vocal cords under direct vision,  positive ETCO2 and breath sounds checked- equal and bilateral Secured at: 24 cm Tube secured with: Tape Dental Injury: Teeth and Oropharynx as per pre-operative assessment

## 2020-01-26 NOTE — Progress Notes (Signed)
Subjective:   Overnight, PCCM consulted for MAP in low 50s with significant agitation. PCCM recommended continuation of fluids, insulin, haldol, and soft restraints.  This morning, patient reports that he is in [facility], however he is unable to provide any further history or comments on his current condition.  Objective: Vital signs in last 24 hours: Vitals:   01/26/20 1125 01/26/20 1130 01/26/20 1145 01/26/20 1200  BP: 105/72 95/84 93/62  (!) 97/56  Pulse: (!) 134 (!) 135 (!) 130 (!) 134  Resp: (!) 29     Temp:      TempSrc:      SpO2: 99% 92% 96% 93%  Weight:      Height:      SpO2: 93 %  Intake/Output Summary (Last 24 hours) at 01/26/2020 1235 Last data filed at 01/26/2020 1015 Gross per 24 hour  Intake 7469 ml  Output --  Net 7469 ml   Filed Weights   01/25/20 1257  Weight: 90.7 kg   Physical Exam Vitals and nursing note reviewed. Exam conducted with a chaperone present.  Constitutional:      General: He is in acute distress.     Appearance: He is ill-appearing.     Comments: In bilateral wrist restraints, frequently repositioning in bed  HENT:     Mouth/Throat:     Mouth: Mucous membranes are dry.     Pharynx: Oropharynx is clear.  Cardiovascular:     Rate and Rhythm: Regular rhythm. Tachycardia present.     Pulses: Normal pulses.     Heart sounds: Normal heart sounds.  Pulmonary:     Effort: Respiratory distress present.     Comments: Tachypneic Abdominal:     General: Abdomen is flat. Bowel sounds are normal.     Tenderness: There is abdominal tenderness.     Comments: Generalized tenderness to palpation  Musculoskeletal:        General: Normal range of motion.     Cervical back: Normal range of motion and neck supple. No rigidity.     Right lower leg: No edema.     Left lower leg: No edema.  Skin:    General: Skin is warm and dry.     Capillary Refill: Capillary refill takes more than 3 seconds.  Neurological:     Comments: Oriented  to place, not responsive to questioning of year or name.   Psychiatric:     Comments: Agitated    CBC Latest Ref Rng & Units 01/26/2020 01/25/2020 01/25/2020  WBC 4.0 - 10.5 K/uL 17.4(H) 15.1(H) -  Hemoglobin 13.0 - 17.0 g/dL 27.0 62.3 76.2  Hematocrit 39 - 52 % 47.0 50.3 48.0  Platelets 150 - 400 K/uL 209 261 -   BMP Latest Ref Rng & Units 01/26/2020 01/26/2020 01/26/2020  Glucose 70 - 99 mg/dL 831(D) 176(H) 607(P)  BUN 6 - 20 mg/dL 71(G) 62(I) 94(W)  Creatinine 0.61 - 1.24 mg/dL 5.46(E) 7.03(J) 0.09(F)  Sodium 135 - 145 mmol/L 149(H) 149(H) 144  Potassium 3.5 - 5.1 mmol/L 3.9 3.8 3.8  Chloride 98 - 111 mmol/L 117(H) 113(H) 111  CO2 22 - 32 mmol/L 17(L) 18(L) 18(L)  Calcium 8.9 - 10.3 mg/dL 9.2 9.0 9.1  Anion gap - 15 Beta-hydroxybutyric acid - 1.71 down from 3.55, >8.00 x2 Lactic acid - 2.6 down from 3.3, 4.1, 4.5 Procalcitonin - 5.93 Hemoglobin A1c - 13.4 Blood cultures - in process Acetone - 0.036 UDS - negative Respiratory panel - negative Salicylate level - negative Methanol,  isopropanol, ethanol - negative  IMAGING: No new imaging  Assessment/Plan:  Active Problems:   DKA (diabetic ketoacidosis) [facility])  Mr. Megan Hayduk is a 44 year old man with past medical history significant for schizophrenia and depression who presented to [facility] on 01/25/2020 for evaluation of altered mental status.  #Diabetic ketoacidosis, improving Hemoglobin of A1c of 13.4. Blood glucose trending down on IV insulin with improvement in severe hyperglycemia (185 down from 837), anion gap (15 down from 33), metabolic acidosis (bicarbonate of 17 up from 8). Patient continues to be tachypneic and with altered mental status. -Continue insulin drip -Transitioned to D5-LR 125cc/hr when CBG <250 -D50 0-69mL PRN for hypoglycemia -NPO -Consult to diabetes coordinator  -Current recommendation:    -Novolog 0-9u Q4H   -Levemir 10u two hours prior to discontinuing IV insulin, followed by daily  dosing -BMP Q4H -Beta-hydroxybutyric acid Q8H -CBG monitoring -Telemetry  #Sepsis, active Patient presented to [facility] with hypothermia, leukocytosis, lactic acid >4 with recurrent hypotension responsive to fluid boluses, and procalcitonin of 5.93. No clear source of infection at this time. Although patient's findings may be attributed to his DKA, patient may have underlying infection. -Continue IVF -Bolus with LR PRN -Follow-up blood cultures -Follow-up Q3H lactic acid -Start cefepime per pharmacy consult -Start vancomycin per pharmacy consult -Daily CBC  #AKI, improving Creatinine trending down, most recently 2.4 from 3.5, although his baseline appears to be 1.20 in January of 2020. -Continue IVF D5-LR per above -BMP Q4H, monitor creatinine  #Elevated troponin, active Elevated troponin of 23 then 26, likely secondary to demand ischemia. -Telemetry  #Major depressive disorder, recurrent, severe, with psychosis #Paranoid schizophrenia, chronic #Agitation, active Patient has chronic history of living with mental health disorders requiring involuntary commitment in the setting of nonadherence to prescribed antipsychotics. According to chart review, patient was discharged from inpatient psychiatric admission in January of 2020 with plan to receive monthly aripiprazole injections as well as risperidone tablets, however patient appears to have been lost to follow-up after this admission. It is unclear whether patient has been taking any antipsychotic medications since this time. Patient is acutely agitated in the setting of his DKA and concern for sepsis. -Haloperidol 1-4mg  PRN Q6H -Safety precautions 1 to 1 observation -Non-violent restraints -Holding home benztropine -Holding home aripiprazole injections -Holding home risperidone tablets  #Insomnia, chronic Patient prescribed temazepam, trazodone and hydroxyzine for insomnia, however no follow-up since January of 2020 and PDMP does  not show refill for temazepam since this time. -Holding home temazepam -Holding home trazodone -Holding home hydroxyzine  #VTE ppx: Lovenox #Code status: Full code #IVF: D5-LR 125cc/hr #Bowel regimen: None  Roylene Reason, MD 01/26/2020, 12:35 PM Pager: [phone] After 5pm on weekdays and 1pm on weekends: On Call pager [phone]

## 2020-01-26 NOTE — Progress Notes (Signed)
eLink Physician-Brief Progress Note Patient Name: Bryan [name] DOB: 1975-07-05 MRN: [id number]   Date of Service  01/26/2020  HPI/Events of Note  Multiple issues: 1. ABG on 100%/PRVC 18/TV 510/P 5 (he is breathing over the set ventilator rate at 36) = 7.287/38.5/91.5 and 2. Sinus Tachycardia - HR = 144.   eICU Interventions  Plan: 1. Increase PRVC rate to 35. 2. Repeat ABG at 9 PM.  3. Wean Epinephrine IV infusion off as tolerated.      Intervention Category Major Interventions: Acid-Base disturbance - evaluation and management;Respiratory failure - evaluation and management  Lenell Antu 01/26/2020, 7:56 PM

## 2020-01-26 NOTE — Procedures (Signed)
Heart rate is too high for accurate echo.   Spoke to NP who said it was ok to try echo in AM.

## 2020-01-26 NOTE — ED Notes (Signed)
Date and time results received: 01/26/20 0245 (use smartphrase ".now" to insert current time)  Test: Lactic Critical Value: 4.1  Name of Provider Notified: Alphonzo Severance, MD  Orders Received? Or Actions Taken?: Continue to monitor patient.

## 2020-01-26 NOTE — Progress Notes (Signed)
Responded to ED code blue page to support patient and staff.  Patient recovered and was intubated and moved to 50m01.  Supported mother emotionally at bedside and provided ministry of presence. Remained with mother until patient was moved to unit.  Will pass on to on call night chaplain for follow up as needed.  Venida Jarvis, Chapl ain, [facility], Pager [phone]

## 2020-01-26 NOTE — Progress Notes (Signed)
Pharmacy Antibiotic Note  Bryan [name] is a 44 y.o. male admitted on 01/25/2020 with altered mental status.  Pharmacy has been consulted for vancomycin and cefepime dosing for sepsis. WBC 17.4. Scr 2.47 with current CrCl 43 ml/min. Baseline Scr likely closer to 1.2   Plan: Cefepime 2g IV q12h  Vancomycin 2000mg  x1 loading dose  Dose vancomycin by random level due to renal impairment  Monitor renal function, cultures, and clinical progression  Height: 5\' 10"  (177.8 cm) Weight: 90.7 kg (200 lb) IBW/kg (Calculated) : 73  Temp (24hrs), Avg:96.1 F (35.6 C), Min:95.2 F (35.1 C), Max:97 F (36.1 C)  Recent Labs  Lab 01/25/20 1235 01/25/20 1411 01/25/20 1738 01/25/20 1821 01/26/20 0104 01/26/20 0345 01/26/20 0723  WBC 16.7*  --  15.1*  --   --  17.4*  --   CREATININE  --  3.51* 3.09*  --  3.28* 2.97* 2.47*  LATICACIDVEN 3.2*  --   --  4.5* 4.1* 3.3*  --     Estimated Creatinine Clearance: 43.7 mL/min (A) (by C-G formula based on SCr of 2.47 mg/dL (H)).    Allergies  Allergen Reactions  . Fluphenazine Shortness Of Breath and Swelling    Tongue swelling, and shaking  . Coffee Bean Extract [Coffea Arabica] Swelling    Antimicrobials this admission: Vancomycin 11/30 >>  Cefepime 11/30 >>   Dose adjustments this admission: N/a  Microbiology results: 11/30 bcx:   Thank you for allowing pharmacy to be a part of this patient's care.  12/30, PharmD Clinical Pharmacist  01/26/2020 8:12 AM

## 2020-01-26 NOTE — Progress Notes (Addendum)
PCCM:  Code blue called overhead for patient. Bedside RN had went for routine blood draw and found patient with eyes rolled back and apneic. Patient was pulseless and CPR was started. Given epi x 1, bicarb x 1 and calcium chloride. Two rounds CPR completed when achieved ROSC. Anesthesia present to intubate patient. Started levophed to maintain MAP >65. Please refer to code sheet for full details.  Patient's mother was updated on-site. Patient transferred to 2M01. Repeat labs ordered. Will obtain CT CAP.  Mechele Collin, M.D. [facility] Pulmonary/Critical Care Medicine 01/26/2020 4:28 PM

## 2020-01-26 NOTE — ED Notes (Signed)
Report given to day shift; patient with three IVs, but left hand is slow running; condom cath in place. Pt continues in restraints due to pulling at lines and drains; sitter at bedside.

## 2020-01-26 NOTE — Procedures (Signed)
Central Venous Catheter Insertion Procedure Note  Bryan [name]  [id number]  02-05-1976  Date:01/26/20  Time:5:48 PM   Provider Performing:Koa Zoeller Theresa Mulligan   Procedure: Insertion of Non-tunneled Central Venous [phone]) with US guidance ([id number])   Indication(s) Medication administration  Consent Risks of the procedure as well as the alternatives and risks of each were explained to the patient and/or caregiver.  Consent for the procedure was obtained and is signed in the bedside chart  Anesthesia Topical only with 1% lidocaine   Timeout Verified patient identification, verified procedure, site/side was marked, verified correct patient position, special equipment/implants available, medications/allergies/relevant history reviewed, required imaging and test results available.  Sterile Technique Maximal sterile technique including full sterile barrier drape, hand hygiene, sterile gown, sterile gloves, mask, hair covering, sterile ultrasound probe cover (if used).  Procedure Description Area of catheter insertion was cleaned with chlorhexidine and draped in sterile fashion.  With real-time ultrasound guidance a central venous catheter was placed into the left internal jugular vein. Nonpulsatile blood flow and easy flushing noted in all ports.  The catheter was sutured in place and sterile dressing applied.  Complications/Tolerance None; patient tolerated the procedure well. Chest X-ray is ordered to verify placement for internal jugular or subclavian cannulation.   Chest x-ray is not ordered for femoral cannulation.  EBL Minimal  Specimen(s) None

## 2020-01-27 ENCOUNTER — Inpatient Hospital Stay (HOSPITAL_COMMUNITY): Payer: Medicaid Other

## 2020-01-27 DIAGNOSIS — K8500 Idiopathic acute pancreatitis without necrosis or infection: Secondary | ICD-10-CM

## 2020-01-27 DIAGNOSIS — I469 Cardiac arrest, cause unspecified: Secondary | ICD-10-CM

## 2020-01-27 DIAGNOSIS — E0811 Diabetes mellitus due to underlying condition with ketoacidosis with coma: Secondary | ICD-10-CM

## 2020-01-27 DIAGNOSIS — R579 Shock, unspecified: Secondary | ICD-10-CM

## 2020-01-27 DIAGNOSIS — N170 Acute kidney failure with tubular necrosis: Secondary | ICD-10-CM

## 2020-01-27 LAB — POCT I-STAT 7, (LYTES, BLD GAS, ICA,H+H)
Acid-Base Excess: 3 mmol/L — ABNORMAL HIGH (ref 0.0–2.0)
Bicarbonate: 25.4 mmol/L (ref 20.0–28.0)
Calcium, Ion: 1.21 mmol/L (ref 1.15–1.40)
HCT: 41 % (ref 39.0–52.0)
Hemoglobin: 13.9 g/dL (ref 13.0–17.0)
O2 Saturation: 93 %
Potassium: 3.5 mmol/L (ref 3.5–5.1)
Sodium: 151 mmol/L — ABNORMAL HIGH (ref 135–145)
TCO2: 26 mmol/L (ref 22–32)
pCO2 arterial: 32.6 mmHg (ref 32.0–48.0)
pH, Arterial: 7.499 — ABNORMAL HIGH (ref 7.350–7.450)
pO2, Arterial: 61 mmHg — ABNORMAL LOW (ref 83.0–108.0)

## 2020-01-27 LAB — GLUCOSE, CAPILLARY
Glucose-Capillary: 120 mg/dL — ABNORMAL HIGH (ref 70–99)
Glucose-Capillary: 124 mg/dL — ABNORMAL HIGH (ref 70–99)
Glucose-Capillary: 133 mg/dL — ABNORMAL HIGH (ref 70–99)
Glucose-Capillary: 139 mg/dL — ABNORMAL HIGH (ref 70–99)
Glucose-Capillary: 142 mg/dL — ABNORMAL HIGH (ref 70–99)
Glucose-Capillary: 144 mg/dL — ABNORMAL HIGH (ref 70–99)
Glucose-Capillary: 147 mg/dL — ABNORMAL HIGH (ref 70–99)
Glucose-Capillary: 147 mg/dL — ABNORMAL HIGH (ref 70–99)
Glucose-Capillary: 148 mg/dL — ABNORMAL HIGH (ref 70–99)
Glucose-Capillary: 149 mg/dL — ABNORMAL HIGH (ref 70–99)
Glucose-Capillary: 161 mg/dL — ABNORMAL HIGH (ref 70–99)
Glucose-Capillary: 163 mg/dL — ABNORMAL HIGH (ref 70–99)
Glucose-Capillary: 168 mg/dL — ABNORMAL HIGH (ref 70–99)
Glucose-Capillary: 189 mg/dL — ABNORMAL HIGH (ref 70–99)
Glucose-Capillary: 209 mg/dL — ABNORMAL HIGH (ref 70–99)
Glucose-Capillary: 211 mg/dL — ABNORMAL HIGH (ref 70–99)

## 2020-01-27 LAB — LACTIC ACID, PLASMA: Lactic Acid, Venous: 2.9 mmol/L (ref 0.5–1.9)

## 2020-01-27 LAB — BASIC METABOLIC PANEL
Anion gap: 17 — ABNORMAL HIGH (ref 5–15)
Anion gap: 16 — ABNORMAL HIGH (ref 5–15)
BUN: 87 mg/dL — ABNORMAL HIGH (ref 6–20)
BUN: 77 mg/dL — ABNORMAL HIGH (ref 6–20)
CO2: 19 mmol/L — ABNORMAL LOW (ref 22–32)
CO2: 22 mmol/L (ref 22–32)
Calcium: 8.9 mg/dL (ref 8.9–10.3)
Calcium: 9.3 mg/dL (ref 8.9–10.3)
Chloride: 111 mmol/L (ref 98–111)
Chloride: 113 mmol/L — ABNORMAL HIGH (ref 98–111)
Creatinine, Ser: 2.84 mg/dL — ABNORMAL HIGH (ref 0.61–1.24)
Creatinine, Ser: 2.29 mg/dL — ABNORMAL HIGH (ref 0.61–1.24)
GFR, Estimated: 27 mL/min — ABNORMAL LOW (ref >60)
GFR, Estimated: 35 mL/min — ABNORMAL LOW (ref >60)
Glucose, Bld: 292 mg/dL — ABNORMAL HIGH (ref 70–99)
Glucose, Bld: 128 mg/dL — ABNORMAL HIGH (ref 70–99)
Potassium: 4.1 mmol/L (ref 3.5–5.1)
Potassium: 3.8 mmol/L (ref 3.5–5.1)
Sodium: 147 mmol/L — ABNORMAL HIGH (ref 135–145)
Sodium: 151 mmol/L — ABNORMAL HIGH (ref 135–145)

## 2020-01-27 LAB — COMPREHENSIVE METABOLIC PANEL
ALT: 42 U/L (ref 0–44)
AST: 82 U/L — ABNORMAL HIGH (ref 15–41)
Albumin: 2 g/dL — ABNORMAL LOW (ref 3.5–5.0)
Alkaline Phosphatase: 75 U/L (ref 38–126)
Anion gap: 14 (ref 5–15)
BUN: 76 mg/dL — ABNORMAL HIGH (ref 6–20)
CO2: 23 mmol/L (ref 22–32)
Calcium: 8.7 mg/dL — ABNORMAL LOW (ref 8.9–10.3)
Chloride: 114 mmol/L — ABNORMAL HIGH (ref 98–111)
Creatinine, Ser: 2.06 mg/dL — ABNORMAL HIGH (ref 0.61–1.24)
GFR, Estimated: 40 mL/min — ABNORMAL LOW (ref >60)
Glucose, Bld: 163 mg/dL — ABNORMAL HIGH (ref 70–99)
Potassium: 3.5 mmol/L (ref 3.5–5.1)
Sodium: 151 mmol/L — ABNORMAL HIGH (ref 135–145)
Total Bilirubin: 1.1 mg/dL (ref 0.3–1.2)
Total Protein: 5 g/dL — ABNORMAL LOW (ref 6.5–8.1)

## 2020-01-27 LAB — ECHOCARDIOGRAM COMPLETE
AR max vel: 2.96 cm2
AV Area VTI: 3.77 cm2
AV Area mean vel: 2.73 cm2
AV Mean grad: 3 mmHg
AV Peak grad: 4.8 mmHg
Ao pk vel: 1.09 m/s
Area-P 1/2: 4.6 cm2
Height: 70 in
S' Lateral: 2.8 cm
Weight: 3200 oz

## 2020-01-27 LAB — CBC
HCT: 43.7 % (ref 39.0–52.0)
Hemoglobin: 14.8 g/dL (ref 13.0–17.0)
MCH: 30.5 pg (ref 26.0–34.0)
MCHC: 33.9 g/dL (ref 30.0–36.0)
MCV: 89.9 fL (ref 80.0–100.0)
Platelets: 148 10*3/uL — ABNORMAL LOW (ref 150–400)
RBC: 4.86 MIL/uL (ref 4.22–5.81)
RDW: 13.6 % (ref 11.5–15.5)
WBC: 17.9 10*3/uL — ABNORMAL HIGH (ref 4.0–10.5)
nRBC: 0.4 % — ABNORMAL HIGH (ref 0.0–0.2)

## 2020-01-27 LAB — BETA-HYDROXYBUTYRIC ACID
Beta-Hydroxybutyric Acid: 2.22 mmol/L — ABNORMAL HIGH (ref 0.05–0.27)
Beta-Hydroxybutyric Acid: 0.76 mmol/L — ABNORMAL HIGH (ref 0.05–0.27)
Beta-Hydroxybutyric Acid: 0.73 mmol/L — ABNORMAL HIGH (ref 0.05–0.27)

## 2020-01-27 LAB — TRIGLYCERIDES
Triglycerides: 607 mg/dL — ABNORMAL HIGH (ref <150)
Triglycerides: 545 mg/dL — ABNORMAL HIGH (ref <150)

## 2020-01-27 LAB — C-REACTIVE PROTEIN: CRP: 55.2 mg/dL — ABNORMAL HIGH (ref <1.0)

## 2020-01-27 LAB — CORTISOL: Cortisol, Plasma: 25.4 ug/dL

## 2020-01-27 LAB — MAGNESIUM: Magnesium: 2.7 mg/dL — ABNORMAL HIGH (ref 1.7–2.4)

## 2020-01-27 LAB — PHOSPHORUS: Phosphorus: 2.5 mg/dL (ref 2.5–4.6)

## 2020-01-27 MED ORDER — CHLORHEXIDINE GLUCONATE CLOTH 2 % EX PADS
6.0000 | MEDICATED_PAD | Freq: Every day | CUTANEOUS | Status: DC
  Administered 2020-01-27 – 2020-03-09 (×33): 6 via TOPICAL

## 2020-01-27 MED ORDER — NOREPINEPHRINE 16 MG/250ML-% IV SOLN
0.0000 ug/min | INTRAVENOUS | Status: DC
  Administered 2020-01-27: 23 ug/min via INTRAVENOUS
  Administered 2020-01-27 (×2): 40 ug/min via INTRAVENOUS
  Administered 2020-01-28: 33 ug/min via INTRAVENOUS
  Administered 2020-01-28: 10 ug/min via INTRAVENOUS
  Filled 2020-01-27 (×5): qty 250

## 2020-01-27 MED ORDER — FREE WATER
200.0000 mL | Status: DC
  Administered 2020-01-27 – 2020-01-29 (×8): 200 mL

## 2020-01-27 MED ORDER — DEXMEDETOMIDINE HCL IN NACL 400 MCG/100ML IV SOLN
0.4000 ug/kg/h | INTRAVENOUS | Status: DC
  Administered 2020-01-27: 0.4 ug/kg/h via INTRAVENOUS
  Administered 2020-01-27 (×3): 1.2 ug/kg/h via INTRAVENOUS
  Administered 2020-01-28 (×2): 0.9 ug/kg/h via INTRAVENOUS
  Administered 2020-01-28 (×2): 1 ug/kg/h via INTRAVENOUS
  Administered 2020-01-28 – 2020-01-29 (×4): 1.2 ug/kg/h via INTRAVENOUS
  Administered 2020-01-29: 0.9 ug/kg/h via INTRAVENOUS
  Administered 2020-01-29 – 2020-02-01 (×13): 1.2 ug/kg/h via INTRAVENOUS
  Filled 2020-01-27 (×23): qty 100
  Filled 2020-01-27: qty 200
  Filled 2020-01-27 (×4): qty 100

## 2020-01-27 MED ORDER — ORAL CARE MOUTH RINSE
15.0000 mL | OROMUCOSAL | Status: DC
  Administered 2020-01-27 – 2020-03-10 (×373): 15 mL via OROMUCOSAL

## 2020-01-27 MED ORDER — CHLORHEXIDINE GLUCONATE 0.12% ORAL RINSE (MEDLINE KIT)
15.0000 mL | Freq: Two times a day (BID) | OROMUCOSAL | Status: DC
  Administered 2020-01-27 – 2020-03-09 (×85): 15 mL via OROMUCOSAL

## 2020-01-27 MED ORDER — FENTANYL 2500MCG IN NS 250ML (10MCG/ML) PREMIX INFUSION
50.0000 ug/h | INTRAVENOUS | Status: DC
  Administered 2020-01-27: 50 ug/h via INTRAVENOUS
  Administered 2020-01-27 – 2020-01-31 (×7): 200 ug/h via INTRAVENOUS
  Administered 2020-01-31 – 2020-02-01 (×2): 100 ug/h via INTRAVENOUS
  Administered 2020-02-01: 150 ug/h via INTRAVENOUS
  Administered 2020-02-02: 175 ug/h via INTRAVENOUS
  Administered 2020-02-03: 50 ug/h via INTRAVENOUS
  Administered 2020-02-03: 150 ug/h via INTRAVENOUS
  Administered 2020-02-04: 100 ug/h via INTRAVENOUS
  Administered 2020-02-04: 150 ug/h via INTRAVENOUS
  Administered 2020-02-04 – 2020-02-05 (×2): 200 ug/h via INTRAVENOUS
  Administered 2020-02-05 (×2): 100 ug/h via INTRAVENOUS
  Filled 2020-01-27 (×17): qty 250

## 2020-01-27 MED ORDER — POTASSIUM CHLORIDE 10 MEQ/50ML IV SOLN
10.0000 meq | INTRAVENOUS | Status: AC
  Administered 2020-01-27 (×3): 10 meq via INTRAVENOUS
  Filled 2020-01-27 (×2): qty 50

## 2020-01-27 MED ORDER — FENTANYL CITRATE (PF) 100 MCG/2ML IJ SOLN
50.0000 ug | Freq: Once | INTRAMUSCULAR | Status: AC
  Administered 2020-01-27: 50 ug via INTRAVENOUS

## 2020-01-27 MED ORDER — MIDAZOLAM HCL 2 MG/2ML IJ SOLN: 2.0000 mg | INTRAMUSCULAR | Status: DC | PRN

## 2020-01-27 MED ORDER — MIDAZOLAM 50MG/50ML (1MG/ML) PREMIX INFUSION
0.5000 mg/h | INTRAVENOUS | Status: DC
  Administered 2020-01-27 – 2020-01-28 (×2): 2 mg/h via INTRAVENOUS
  Administered 2020-01-29: 1.5 mg/h via INTRAVENOUS
  Filled 2020-01-27 (×3): qty 50

## 2020-01-27 MED ORDER — POTASSIUM CHLORIDE 20 MEQ PO PACK
40.0000 meq | PACK | Freq: Once | ORAL | Status: AC
  Administered 2020-01-27: 40 meq via ORAL
  Filled 2020-01-27: qty 2

## 2020-01-27 MED ORDER — PERFLUTREN LIPID MICROSPHERE
1.0000 mL | INTRAVENOUS | Status: AC | PRN
  Filled 2020-01-27: qty 10

## 2020-01-27 MED ORDER — FENTANYL BOLUS VIA INFUSION
50.0000 ug | INTRAVENOUS | Status: DC | PRN
  Administered 2020-01-28 – 2020-02-05 (×15): 50 ug via INTRAVENOUS
  Filled 2020-01-27: qty 50

## 2020-01-27 NOTE — Progress Notes (Signed)
  Echocardiogram 2D Echocardiogram has been performed.  Bryan [name] 01/27/2020, 3:15 PM

## 2020-01-27 NOTE — Progress Notes (Signed)
Initial Nutrition Assessment  DOCUMENTATION CODES:   Not applicable  INTERVENTION:   When able to begin TF via OG tube, recommend: Vital 1.5 at 20 ml/h (480 ml per day) As medical status allows, increase by 10 ml every 8 hours to goal rate of 70 ml/h (1680 ml per day)  Prosource TF 45 ml QID  Provides 2680 kcal, 157 gm protein, 1284 ml free water daily  If patient does not tolerate trickle TF via OG tube, consider Cortrak placement with tip at the ligament of Treitz.   NUTRITION DIAGNOSIS:   Increased nutrient needs related to acute illness (pancreatitis) as evidenced by estimated needs.  GOAL:   Patient will meet greater than or equal to 90% of their needs  MONITOR:   Vent status, TF tolerance, Labs  REASON FOR ASSESSMENT:   Ventilator    ASSESSMENT:   44 yo Bryan [name] admitted with AMS, new onset DM with DKA. PMH includes MDD, paranoid schizophrenia, HTN, smoker, drug use (cocaine, marijuana), heavy alcohol use.   Discussed patient in ICU rounds and with RN today. OG tube in place.  Discussed concern for pancreatitis. May be able to begin trickle tube feeding later today depending on pressor requirements.   Spoke with patient's mother at bedside. She reports that patient has had nothing to eat since last Sunday (10 days ago) because he had a sore throat.  Patient is currently intubated on ventilator support MV: 20.3 L/min Temp (24hrs), Avg:101 F (38.3 C), Min:98.5 F (36.9 C), Max:102.3 F (39.1 C)  Propofol: 19 ml/hr providing 502 kcal from lipid.  Labs reviewed. Sodium 151, BUN 77, creat 2.29, mag 2.7, triglycerides 607 CBG: [id number]  Medications reviewed and include colace, miralax, precedex, IV insulin, levophed, vasopressin.  Free water flushes added today for hypernatremia, 200 ml every 4 hours.  Current weight documented at 90.7 kg (200 lbs).  Most recent weight available PTA is from 08/20/17 when he weighed 81.6 kg.  I/O +11.9 L since  admission UOP 450 ml since transferring to the ICU  Patient is at increased nutrition risk, given reported inadequate intake x 10 days and current NPO status.    NUTRITION - FOCUSED PHYSICAL EXAM:    Most Recent Value  Orbital Region No depletion  Upper Arm Region No depletion  Thoracic and Lumbar Region No depletion  Buccal Region No depletion  Temple Region Mild depletion  Clavicle Bone Region Mild depletion  Clavicle and Acromion Bone Region No depletion  Scapular Bone Region No depletion  Dorsal Hand No depletion  Patellar Region No depletion  Anterior Thigh Region No depletion  Posterior Calf Region No depletion  Edema (RD Assessment) Mild  Hair Reviewed  Eyes Unable to assess  Mouth Unable to assess  Skin Reviewed  Nails Reviewed       Diet Order:   Diet Order            Diet NPO time specified  Diet effective now                 EDUCATION NEEDS:   Not appropriate for education at this time  Skin:  Skin Assessment: Reviewed RN Assessment  Last BM:  no BM documented  Height:   Ht Readings from Last 1 Encounters:  01/25/20 5\' 10"  (1.778 m)    Weight:   Wt Readings from Last 1 Encounters:  01/25/20 90.7 kg    Ideal Body Weight:  75.5 kg  BMI:  Body mass index is 28.7 kg/m.  Estimated  Nutritional Needs:   Kcal:  2600  Protein:  140-160 gm  Fluid:  2.5 L    Bryan Bryan [name], RD, LDN, CNSC Please refer to Amion for contact information.

## 2020-01-27 NOTE — Progress Notes (Signed)
NAME:  Bryan [name], MRN:  [id number], DOB:  [birth year], LOS: 2 ADMISSION DATE:  01/25/2020, CONSULTATION DATE:  01/25/20 REFERRING MD:  Bryan [name] CHIEF COMPLAINT:  AMS   Brief History   Bryan [name] is a 44 y.o. male who was admitted 11/29 with DKA under IMTS.  He had borderline hypotension along with agitation; therefore, PCCM asked to see in consultation on 01/26/20 and later had PEA cardiac arrest with 2 rounds of CPR performed when ROSC achieved and the patient was intuabted. He was then transferred to the ICU for further care where CVL and arterial line were placed.   CT Head is unremarkable except for right sinus disease. CT Chest/Abdomen/Pelvis showed evidence of moderate acute pancreatitis, mild ascites and small bilateral pleural effusions. Lipase was noted to be 315U/L on 01/26/20. No clear etiology of pancreatitis is evident as his mother reports he does not drink alcohol. No dilated biliary ducts or gall stones noted on CT scan. Triglycerides 600.       Past Medical History  has PAIN IN JOINT, MULTIPLE SITES; FATIGUE; Shortness of breath; Observed seizure-like activity (HCC); Schizoaffective disorder (HCC); Schizoaffective disorder, depressive type (HCC); Illiterate; Delusional disorder (HCC); Undifferentiated schizophrenia (HCC); MDD (major depressive disorder), recurrent, severe, with psychosis (HCC); Paranoid schizophrenia (HCC); and DKA (diabetic ketoacidosis) (HCC) on their problem list.  Significant Hospital Events   11/29 > admit. 11/30 PEA Arrest and transfer to ICU  Consults:  PCCM  Procedures:  11/30 ETT 11/30 A line 11/30 Left IJ CVL  Significant Diagnostic Tests:  CXR 11/29 > neg.  CT Head 11/30 - right sinus disease, no acute intracranial pathology CT Chest/Abd/Pel 11/30 - moderate pancreatis, mild asicites. Small bilateral pleural effusions  Micro Data:  11/30 Bld Cx >> 11/30 MRSA - negative  Antimicrobials:  01/26/2020 cefepime 09/25/2019  vancomycin  Interim history/subjective:  Patient is requiring levophed and vasopressin. Weaned off Epinephrine overnight due to tachycardia.  He was on propofol overnight for sedation.  He is 4.9L positive  Dyssynchronous with the vent  Objective:  Blood pressure (!) 122/98, pulse (!) 137, temperature (!) 102.3 F (39.1 C), temperature source Oral, resp. rate (!) 33, height 5\' 10"  (1.778 m), weight 90.7 kg, SpO2 96 %.    Vent Mode: PRVC FiO2 (%):  [40 %-100 %] 40 % Set Rate:  [18 bmp-35 bmp] 35 bmp Vt Set:  [510 mL] 510 mL PEEP:  [5 cmH20] 5 cmH20 Plateau Pressure:  [20 cmH20-23 cmH20] 20 cmH20   Intake/Output Summary (Last 24 hours) at 01/27/2020 0725 Last data filed at 01/27/2020 14/02/2019 Gross per 24 hour  Intake 5372.05 ml  Output 450 ml  Net 4922.05 ml   Filed Weights   01/25/20 1257  Weight: 90.7 kg    Examination: General: sedated, dyssnchronous with the vent HEENT: sclera anicteric, moist mucous membranes Neuro: sedated CV: tachycardic, s1s2, no murmurs PULM: course breath sounds GI: soft, distended, hypoactive bowel sounds GU: Amber urine Extremities: warm/dry,  No edema  Skin: no rashes or lesions   Assessment & Plan:  Shock PEA Cardiac Arrest In setting of severe pancreatitis. Other etiologies include PE vs infectious - Lower extremity dopplers are negative for DVT, awaiting TTE - Covering with cefepime at this time. Vancomycin discontinued, MRSA screen negative - Continue to wean levophed and vasopressin for MAP goal >65  Severe Pancreatitis Unknown etiology at this time. No reported alcohol or drug use. Triglycerides are 600 on initial check. No prescription drugs that could have this side effect. No dilation  of biliary tract or concern for stones on CT abdomen.  - Check RUQUS. Consider MRCP if dilated  - Continue insulin drip for elevated triglycerides and stop propofol - He has received aggressive fluid resuscitation, continue D5+LR at  137ml/hr  Diabetic Ketoacidosis New onset in setting of pancreatititis - Trend BMP and beta-hydroxybutyrate - cont insulin drip and fluids as above  Acute Hypoxemic Respiratory Failure In setting of cardiac arrest, shock and pancreatitis - Continue ventilator support, lung protective strategy - Fentanyl and precedex for sedation. May require versed.  - VAP prevention protocol ordered  Acute Kidney Injury Hypernatremia In setting of shock - continue fluids as above - No acute issues noted on CT abdomen - Monitor renal function and UOP - Free water q4hr started via OG tube for hypernatremia - Hypernatremia in setting of aggressive fluid resuscitation  Encephalopathy In setting of shock and pancreatitis - Continue sedation as above  Best Practice:  Diet: NPO. Pain/Anxiety/Delirium protocol (if indicated): Fenanyl, precedex VAP protocol (if indicated): Ordered DVT prophylaxis: SCD's / Lovenox. GI prophylaxis: PPI Glucose control: DKA protocol. Mobility: Bedrest. Last date of multidisciplinary goals of care discussion: 01/27/20 at bedside with nurse and Dr. Francine [name] Family and staff present: Nurse and doctor Summary of discussion: Patient is full code. Continue aggressive care measures. Follow up goals of care discussion due: 02/02/20 Code Status: Full Family Communication: Updated mother at bedside today Disposition: ICU  Labs   CBC: Recent Labs  Lab 01/25/20 1235 01/25/20 1235 01/25/20 1600 01/25/20 1738 01/26/20 0345 01/26/20 1637 01/26/20 2111  WBC 16.7*  --   --  15.1* 17.4* 16.2*  --   NEUTROABS 13.6*  --   --   --   --   --   --   HGB 16.3   < > 16.3 16.0 16.0 14.0 12.9*  HCT 50.1   < > 48.0 50.3 47.0 39.8 38.0*  MCV 94.0  --   --  94.9 89.5 89.8  --   PLT 321  --   --  261 209 149*  --    < > = values in this interval not displayed.   Basic Metabolic Panel: Recent Labs  Lab 01/26/20 0932 01/26/20 0932 01/26/20 1405 01/26/20 1637 01/26/20 2035  01/26/20 2111 01/26/20 2332  NA 149*   < > 147* 152* 146* 148* 147*  K 3.9   < > 3.7 3.9 4.5 4.3 4.1  CL 117*  --  113* 116* 111  --  111  CO2 17*  --  19* 14* 17*  --  19*  GLUCOSE 141*  --  149* 222* 395*  --  292*  BUN 83*  --  86* 89* 85*  --  87*  CREATININE 2.44*  --  2.76* 2.81* 2.88*  --  2.84*  CALCIUM 9.2  --  9.1 9.7 8.9  --  8.9   < > = values in this interval not displayed.   GFR: Estimated Creatinine Clearance: 38 mL/min (A) (by C-G formula based on SCr of 2.84 mg/dL (H)). Recent Labs  Lab 01/25/20 1235 01/25/20 1738 01/25/20 1821 01/26/20 0104 01/26/20 0104 01/26/20 0345 01/26/20 0932 01/26/20 1615 01/26/20 1637 01/26/20 2035 01/26/20 2341  PROCALCITON  --   --   --  5.93  --   --   --   --   --   --   --   WBC 16.7* 15.1*  --   --   --  17.4*  --   --  16.2*  --   --   LATICACIDVEN 3.2*  --    < > 4.1*   < > 3.3*   < > 6.8* 3.7* 2.4* 2.9*   < > = values in this interval not displayed.   Liver Function Tests: Recent Labs  Lab 01/25/20 1411 01/26/20 1637  AST 30 152*  ALT 36 50*  ALKPHOS 114 63  BILITOT 2.5* 1.1  PROT 6.4* 4.2*  ALBUMIN 3.2* 2.2*   Recent Labs  Lab 01/26/20 1615  LIPASE 315*   No results for input(s): AMMONIA in the last 168 hours. ABG    Component Value Date/Time   PHART 7.333 (L) 01/26/2020 2111   PCO2ART 37.5 01/26/2020 2111   PO2ART 197 (H) 01/26/2020 2111   HCO3 19.5 (L) 01/26/2020 2111   TCO2 21 (L) 01/26/2020 2111   ACIDBASEDEF 5.0 (H) 01/26/2020 2111   O2SAT 100.0 01/26/2020 2111    Coagulation Profile: No results for input(s): INR, PROTIME in the last 168 hours. Cardiac Enzymes: No results for input(s): CKTOTAL, CKMB, CKMBINDEX, TROPONINI in the last 168 hours. HbA1C: Hgb A1c MFr Bld  Date/Time Value Ref Range Status  01/25/2020 05:40 PM 13.4 (H) 4.8 - 5.6 % Final    Comment:    (NOTE) Pre diabetes:          5.7%-6.4%  Diabetes:              >6.4%  Glycemic control for   <7.0% adults with diabetes     CBG: Recent Labs  Lab 01/27/20 0124 01/27/20 0222 01/27/20 0425 01/27/20 0535 01/27/20 0632  GLUCAP 211* 189* 120* 133* 147*    Melody Comas, MD [place] [facility] Office: [phone]   See Amion for Pager Details

## 2020-01-27 NOTE — Progress Notes (Signed)
Lower extremity venous bilateral study completed.  Preliminary results relayed to RN.   See CV Proc for preliminary results report.   Takiya Belmares, RDMS  

## 2020-01-27 NOTE — Progress Notes (Signed)
Inpatient Diabetes Program Recommendations  AACE/ADA: New Consensus Statement on Inpatient Glycemic Control (2015)  Target Ranges:  Prepandial:   less than 140 mg/dL      Peak postprandial:   less than 180 mg/dL (1-2 hours)      Critically ill patients:  140 - 180 mg/dL   Lab Results  Component Value Date   GLUCAP 147 (H) 01/27/2020   HGBA1C 13.4 (H) 01/25/2020    Review of Glycemic Control Results for KARMA, HINEY (MRN [id number]) as of 01/27/2020 12:38  Ref. Range 01/27/2020 06:32 01/27/2020 07:44 01/27/2020 08:44 01/27/2020 10:05 01/27/2020 10:52  Glucose-Capillary Latest Ref Range: 70 - 99 mg/dL 001 (H) 749 (H) 449 (H) 139 (H) 142 (H)   Diabetes history: None- New diagnosis Current orders for Inpatient glycemic control:  IV insulin  Inpatient Diabetes Program Recommendations:    When patient is ready to transition off insulin drip consider, Levemir 8 units bid (overlap Levemir with IV insulin for 2 hours). Also consider Novolog sensitive correction q 4 hours. Once tube feeds started please add Novolog 3 units q 4 hours to cover CHO in tube feeds (hold if tube feeds held for any reason).    Thanks  Beryl Meager, RN, BC-ADM Inpatient Diabetes Coordinator Pager [phone] (8a-5p)

## 2020-01-27 NOTE — Progress Notes (Signed)
eLink Physician-Brief Progress Note Patient Name: Che Rachal DOB: 1975-06-07 MRN: [id number]   Date of Service  01/27/2020  HPI/Events of Note  ABG on 60%/PRVC 35/TV 510/P 5 = 7.333/37.5/197.  eICU Interventions  Continue present ventilator management.      Intervention Category Major Interventions: Respiratory failure - evaluation and management  Kaimen Peine Eugene 01/27/2020, 2:26 AM

## 2020-01-27 NOTE — Progress Notes (Signed)
Patient transported to CT scan and back to 2M01 without incidence.

## 2020-01-28 ENCOUNTER — Inpatient Hospital Stay (HOSPITAL_COMMUNITY): Payer: Medicaid Other

## 2020-01-28 DIAGNOSIS — E1010 Type 1 diabetes mellitus with ketoacidosis without coma: Secondary | ICD-10-CM | POA: Diagnosis not present

## 2020-01-28 LAB — POCT I-STAT 7, (LYTES, BLD GAS, ICA,H+H)
Acid-Base Excess: 2 mmol/L (ref 0.0–2.0)
Acid-Base Excess: 3 mmol/L — ABNORMAL HIGH (ref 0.0–2.0)
Bicarbonate: 25.4 mmol/L (ref 20.0–28.0)
Bicarbonate: 26.1 mmol/L (ref 20.0–28.0)
Calcium, Ion: 1.22 mmol/L (ref 1.15–1.40)
Calcium, Ion: 1.24 mmol/L (ref 1.15–1.40)
HCT: 35 % — ABNORMAL LOW (ref 39.0–52.0)
HCT: 38 % — ABNORMAL LOW (ref 39.0–52.0)
Hemoglobin: 11.9 g/dL — ABNORMAL LOW (ref 13.0–17.0)
Hemoglobin: 12.9 g/dL — ABNORMAL LOW (ref 13.0–17.0)
O2 Saturation: 91 %
O2 Saturation: 96 %
Potassium: 3.5 mmol/L (ref 3.5–5.1)
Potassium: 3.8 mmol/L (ref 3.5–5.1)
Sodium: 151 mmol/L — ABNORMAL HIGH (ref 135–145)
Sodium: 153 mmol/L — ABNORMAL HIGH (ref 135–145)
TCO2: 26 mmol/L (ref 22–32)
TCO2: 27 mmol/L (ref 22–32)
pCO2 arterial: 35 mmHg (ref 32.0–48.0)
pCO2 arterial: 36.7 mmHg (ref 32.0–48.0)
pH, Arterial: 7.448 (ref 7.350–7.450)
pH, Arterial: 7.48 — ABNORMAL HIGH (ref 7.350–7.450)
pO2, Arterial: 56 mmHg — ABNORMAL LOW (ref 83.0–108.0)
pO2, Arterial: 76 mmHg — ABNORMAL LOW (ref 83.0–108.0)

## 2020-01-28 LAB — BETA-HYDROXYBUTYRIC ACID: Beta-Hydroxybutyric Acid: 0.41 mmol/L — ABNORMAL HIGH (ref 0.05–0.27)

## 2020-01-28 LAB — COMPREHENSIVE METABOLIC PANEL
ALT: 41 U/L (ref 0–44)
ALT: 38 U/L (ref 0–44)
ALT: 34 U/L (ref 0–44)
AST: 73 U/L — ABNORMAL HIGH (ref 15–41)
AST: 58 U/L — ABNORMAL HIGH (ref 15–41)
AST: 47 U/L — ABNORMAL HIGH (ref 15–41)
Albumin: 1.9 g/dL — ABNORMAL LOW (ref 3.5–5.0)
Albumin: 1.8 g/dL — ABNORMAL LOW (ref 3.5–5.0)
Albumin: 1.6 g/dL — ABNORMAL LOW (ref 3.5–5.0)
Alkaline Phosphatase: 80 U/L (ref 38–126)
Alkaline Phosphatase: 77 U/L (ref 38–126)
Alkaline Phosphatase: 75 U/L (ref 38–126)
Anion gap: 12 (ref 5–15)
Anion gap: 13 (ref 5–15)
Anion gap: 6 (ref 5–15)
BUN: 72 mg/dL — ABNORMAL HIGH (ref 6–20)
BUN: 65 mg/dL — ABNORMAL HIGH (ref 6–20)
BUN: 51 mg/dL — ABNORMAL HIGH (ref 6–20)
CO2: 25 mmol/L (ref 22–32)
CO2: 24 mmol/L (ref 22–32)
CO2: 23 mmol/L (ref 22–32)
Calcium: 8.4 mg/dL — ABNORMAL LOW (ref 8.9–10.3)
Calcium: 8.7 mg/dL — ABNORMAL LOW (ref 8.9–10.3)
Calcium: 7.2 mg/dL — ABNORMAL LOW (ref 8.9–10.3)
Chloride: 113 mmol/L — ABNORMAL HIGH (ref 98–111)
Chloride: 116 mmol/L — ABNORMAL HIGH (ref 98–111)
Chloride: 120 mmol/L — ABNORMAL HIGH (ref 98–111)
Creatinine, Ser: 1.92 mg/dL — ABNORMAL HIGH (ref 0.61–1.24)
Creatinine, Ser: 1.81 mg/dL — ABNORMAL HIGH (ref 0.61–1.24)
Creatinine, Ser: 1.48 mg/dL — ABNORMAL HIGH (ref 0.61–1.24)
GFR, Estimated: 44 mL/min — ABNORMAL LOW (ref >60)
GFR, Estimated: 47 mL/min — ABNORMAL LOW (ref >60)
GFR, Estimated: 60 mL/min — ABNORMAL LOW (ref >60)
Glucose, Bld: 181 mg/dL — ABNORMAL HIGH (ref 70–99)
Glucose, Bld: 184 mg/dL — ABNORMAL HIGH (ref 70–99)
Glucose, Bld: 171 mg/dL — ABNORMAL HIGH (ref 70–99)
Potassium: 4.2 mmol/L (ref 3.5–5.1)
Potassium: 3.6 mmol/L (ref 3.5–5.1)
Potassium: 3.4 mmol/L — ABNORMAL LOW (ref 3.5–5.1)
Sodium: 150 mmol/L — ABNORMAL HIGH (ref 135–145)
Sodium: 153 mmol/L — ABNORMAL HIGH (ref 135–145)
Sodium: 149 mmol/L — ABNORMAL HIGH (ref 135–145)
Total Bilirubin: 1.6 mg/dL — ABNORMAL HIGH (ref 0.3–1.2)
Total Bilirubin: 1 mg/dL (ref 0.3–1.2)
Total Bilirubin: 1 mg/dL (ref 0.3–1.2)
Total Protein: 4.9 g/dL — ABNORMAL LOW (ref 6.5–8.1)
Total Protein: 5 g/dL — ABNORMAL LOW (ref 6.5–8.1)
Total Protein: 4.6 g/dL — ABNORMAL LOW (ref 6.5–8.1)

## 2020-01-28 LAB — GLUCOSE, CAPILLARY
Glucose-Capillary: 147 mg/dL — ABNORMAL HIGH (ref 70–99)
Glucose-Capillary: 150 mg/dL — ABNORMAL HIGH (ref 70–99)
Glucose-Capillary: 155 mg/dL — ABNORMAL HIGH (ref 70–99)
Glucose-Capillary: 162 mg/dL — ABNORMAL HIGH (ref 70–99)
Glucose-Capillary: 165 mg/dL — ABNORMAL HIGH (ref 70–99)
Glucose-Capillary: 168 mg/dL — ABNORMAL HIGH (ref 70–99)
Glucose-Capillary: 168 mg/dL — ABNORMAL HIGH (ref 70–99)
Glucose-Capillary: 170 mg/dL — ABNORMAL HIGH (ref 70–99)
Glucose-Capillary: 175 mg/dL — ABNORMAL HIGH (ref 70–99)
Glucose-Capillary: 175 mg/dL — ABNORMAL HIGH (ref 70–99)
Glucose-Capillary: 177 mg/dL — ABNORMAL HIGH (ref 70–99)
Glucose-Capillary: 178 mg/dL — ABNORMAL HIGH (ref 70–99)
Glucose-Capillary: 181 mg/dL — ABNORMAL HIGH (ref 70–99)
Glucose-Capillary: 183 mg/dL — ABNORMAL HIGH (ref 70–99)
Glucose-Capillary: 186 mg/dL — ABNORMAL HIGH (ref 70–99)
Glucose-Capillary: 191 mg/dL — ABNORMAL HIGH (ref 70–99)
Glucose-Capillary: 191 mg/dL — ABNORMAL HIGH (ref 70–99)
Glucose-Capillary: 197 mg/dL — ABNORMAL HIGH (ref 70–99)
Glucose-Capillary: 212 mg/dL — ABNORMAL HIGH (ref 70–99)

## 2020-01-28 LAB — CBC
HCT: 38.1 % — ABNORMAL LOW (ref 39.0–52.0)
Hemoglobin: 13.3 g/dL (ref 13.0–17.0)
MCH: 31.6 pg (ref 26.0–34.0)
MCHC: 34.9 g/dL (ref 30.0–36.0)
MCV: 90.5 fL (ref 80.0–100.0)
Platelets: 126 10*3/uL — ABNORMAL LOW (ref 150–400)
RBC: 4.21 MIL/uL — ABNORMAL LOW (ref 4.22–5.81)
RDW: 14.2 % (ref 11.5–15.5)
WBC: 16.7 10*3/uL — ABNORMAL HIGH (ref 4.0–10.5)
nRBC: 0.2 % (ref 0.0–0.2)

## 2020-01-28 LAB — TRIGLYCERIDES: Triglycerides: 423 mg/dL — ABNORMAL HIGH (ref <150)

## 2020-01-28 LAB — LACTATE DEHYDROGENASE: LDH: 312 U/L — ABNORMAL HIGH (ref 98–192)

## 2020-01-28 LAB — MAGNESIUM: Magnesium: 2.2 mg/dL (ref 1.7–2.4)

## 2020-01-28 LAB — PHOSPHORUS: Phosphorus: 1.6 mg/dL — ABNORMAL LOW (ref 2.5–4.6)

## 2020-01-28 MED ORDER — VITAL 1.5 CAL PO LIQD
1000.0000 mL | ORAL | Status: DC
  Administered 2020-01-28: 1000 mL
  Filled 2020-01-28: qty 1000

## 2020-01-28 MED ORDER — DEXTROSE 5 % IV SOLN: INTRAVENOUS | Status: DC

## 2020-01-28 MED ORDER — POTASSIUM CHLORIDE 20 MEQ PO PACK
40.0000 meq | PACK | Freq: Two times a day (BID) | ORAL | Status: DC
  Administered 2020-01-28 – 2020-01-30 (×5): 40 meq
  Filled 2020-01-28 (×5): qty 2

## 2020-01-28 MED ORDER — POTASSIUM CHLORIDE 20 MEQ PO PACK
40.0000 meq | PACK | Freq: Two times a day (BID) | ORAL | Status: DC
  Administered 2020-01-28: 40 meq via ORAL
  Filled 2020-01-28: qty 2

## 2020-01-28 MED FILL — Medication: Qty: 1 | Status: AC

## 2020-01-28 NOTE — Progress Notes (Addendum)
Inpatient Diabetes Program Recommendations  AACE/ADA: New Consensus Statement on Inpatient Glycemic Control (2015)  Target Ranges:  Prepandial:   less than 140 mg/dL      Peak postprandial:   less than 180 mg/dL (1-2 hours)      Critically ill patients:  140 - 180 mg/dL   Lab Results  Component Value Date   GLUCAP 175 (H) 01/28/2020   HGBA1C 13.4 (H) 01/25/2020    Review of Glycemic Control Results for DONN, ZANETTI (MRN [id number]) as of 01/28/2020 12:38  Ref. Range 01/28/2020 04:26 01/28/2020 06:27 01/28/2020 08:27 01/28/2020 10:25 01/28/2020 12:26  Glucose-Capillary Latest Ref Range: 70 - 99 mg/dL 953 (H) 202 (H) 334 (H) 147 (H) 175 (H)   Diabetes history: New Diagnosis of DM Current orders for Inpatient glycemic control:  IV insulin  Inpatient Diabetes Program Recommendations:    Note patient remains on insulin drip.  It appears acidosis cleared and AG closed.   If patient to transition off insulin drip today, consider adding Levemir 18 units bid (drip rate varies between 3-4.4 units/hr).  Once tube feeds added, may also need tube feed coverage.  Also will need Novolog sensitive correction q 4 hours.    Thanks,  Beryl Meager, RN, BC-ADM Inpatient Diabetes Coordinator Pager [phone] (8a-5p)

## 2020-01-28 NOTE — Progress Notes (Signed)
Upon reassessment of patient, swelling of lower left arm around IV site noted. Fluid medication being administered through IV stopped immediately, provider notified and IV removed.

## 2020-01-28 NOTE — Plan of Care (Signed)
Problem: Education: Goal: Knowledge of General Education information will improve Description: Including pain rating scale, medication(s)/side effects and non-pharmacologic comfort measures 01/28/2020 1512 by Darryl Nestle, RN Outcome: Progressing 01/28/2020 1511 by Darryl Nestle, RN Outcome: Progressing   Problem: Health Behavior/Discharge Planning: Goal: Ability to manage health-related needs will improve 01/28/2020 1512 by Darryl Nestle, RN Outcome: Progressing 01/28/2020 1511 by Darryl Nestle, RN Outcome: Progressing   Problem: Clinical Measurements: Goal: Ability to maintain clinical measurements within normal limits will improve 01/28/2020 1512 by Darryl Nestle, RN Outcome: Progressing 01/28/2020 1511 by Darryl Nestle, RN Outcome: Progressing Goal: Will remain free from infection 01/28/2020 1512 by Darryl Nestle, RN Outcome: Progressing 01/28/2020 1511 by Darryl Nestle, RN Outcome: Progressing Goal: Diagnostic test results will improve 01/28/2020 1512 by Darryl Nestle, RN Outcome: Progressing 01/28/2020 1511 by Darryl Nestle, RN Outcome: Progressing Goal: Respiratory complications will improve 01/28/2020 1512 by Darryl Nestle, RN Outcome: Progressing 01/28/2020 1511 by Darryl Nestle, RN Outcome: Progressing Goal: Cardiovascular complication will be avoided 01/28/2020 1512 by Darryl Nestle, RN Outcome: Progressing 01/28/2020 1511 by Darryl Nestle, RN Outcome: Progressing   Problem: Activity: Goal: Risk for activity intolerance will decrease 01/28/2020 1512 by Darryl Nestle, RN Outcome: Progressing 01/28/2020 1511 by Darryl Nestle, RN Outcome: Progressing   Problem: Nutrition: Goal: Adequate nutrition will be maintained 01/28/2020 1512 by Darryl Nestle, RN Outcome: Progressing 01/28/2020 1511 by Darryl Nestle, RN Outcome: Progressing   Problem: Coping: Goal: Level of anxiety will  decrease 01/28/2020 1512 by Darryl Nestle, RN Outcome: Progressing 01/28/2020 1511 by Darryl Nestle, RN Outcome: Progressing   Problem: Elimination: Goal: Will not experience complications related to bowel motility 01/28/2020 1512 by Darryl Nestle, RN Outcome: Progressing 01/28/2020 1511 by Darryl Nestle, RN Outcome: Progressing Goal: Will not experience complications related to urinary retention 01/28/2020 1512 by Darryl Nestle, RN Outcome: Progressing 01/28/2020 1511 by Darryl Nestle, RN Outcome: Progressing   Problem: Pain Managment: Goal: General experience of comfort will improve 01/28/2020 1512 by Darryl Nestle, RN Outcome: Progressing 01/28/2020 1511 by Darryl Nestle, RN Outcome: Progressing   Problem: Safety: Goal: Ability to remain free from injury will improve 01/28/2020 1512 by Darryl Nestle, RN Outcome: Progressing 01/28/2020 1511 by Darryl Nestle, RN Outcome: Progressing   Problem: Skin Integrity: Goal: Risk for impaired skin integrity will decrease 01/28/2020 1512 by Darryl Nestle, RN Outcome: Progressing 01/28/2020 1511 by Darryl Nestle, RN Outcome: Progressing   Problem: Education: Goal: Ability to describe self-care measures that may prevent or decrease complications (Diabetes Survival Skills Education) will improve Outcome: Progressing Goal: Individualized Educational Video(s) Outcome: Progressing   Problem: Cardiac: Goal: Ability to maintain an adequate cardiac output will improve Outcome: Progressing   Problem: Health Behavior/Discharge Planning: Goal: Ability to identify and utilize available resources and services will improve Outcome: Progressing Goal: Ability to manage health-related needs will improve Outcome: Progressing   Problem: Fluid Volume: Goal: Ability to achieve a balanced intake and output will improve Outcome: Progressing   Problem: Metabolic: Goal: Ability to maintain  appropriate glucose levels will improve Outcome: Progressing   Problem: Nutritional: Goal: Maintenance of adequate nutrition will improve Outcome: Progressing Goal: Maintenance of adequate weight for body size and type will improve Outcome: Progressing   Problem: Respiratory: Goal: Will regain and/or maintain adequate ventilation Outcome: Progressing   Problem: Urinary Elimination: Goal: Ability to achieve and maintain adequate renal perfusion  and functioning will improve Outcome: Progressing

## 2020-01-28 NOTE — Progress Notes (Addendum)
NAME:  Bryan [name], MRN:  [id number], DOB:  May 10, 1975, LOS: 3 ADMISSION DATE:  01/25/2020, CONSULTATION DATE:  01/25/20 REFERRING MD:  Claudette Laws CHIEF COMPLAINT:  AMS   Brief History   Bryan [name] is a 44 y.o. male admitted w/ DKA on 11/29.  PCCM consulted 11/30 for agitation and borderline hypotension. Patient later had PEA cardiac arrest, 2 rounds of CPR when ROSC achieved, patient intubated. Transferred to ICU, CVL and arterial line placed.   CT Head is unremarkable except for right sinus disease. CT Chest/Abdomen/Pelvis showed evidence of moderate acute pancreatitis, mild ascites and small bilateral pleural effusions. Lipase of 315U/L on 01/26/20. No initial clear etiology for pancreatitis, mother denies patient using alcohol, CT a/p negative for dilated biliary ducts or gall stones. Triglycerides found to be 600.       Past Medical History  has PAIN IN JOINT, MULTIPLE SITES; FATIGUE; Shortness of breath; Observed seizure-like activity (HCC); Schizoaffective disorder (HCC); Schizoaffective disorder, depressive type (HCC); Illiterate; Delusional disorder (HCC); Undifferentiated schizophrenia (HCC); MDD (major depressive disorder), recurrent, severe, with psychosis (HCC); Paranoid schizophrenia (HCC); and DKA (diabetic ketoacidosis) (HCC) on their problem list.  Significant Hospital Events   11/29 - admitted 11/30 - PEA Arrest and transfer to ICU  Consults:  PCCM  Procedures:  11/30 ETT 11/30 A line, removed 01/28/20 11/30 Left IJ CVL 11/30 NG tube placed  Significant Diagnostic Tests:  CXR 11/29 - neg.  CT Head 12/1 - right sinus disease, no acute intracranial pathology CT Chest/Abd/Pel 12/1 -moderate pancreatis, mild asicites. Small bilateral pleural effusions Abd x-ray 12/1 - NG tube tip and side port in proximal stomach. No bowel obstruction or free air CXR 12/1 - No pneumothorax, developing pneumonia left lower lobe. Mild bibasilar atelectasis. RUQ [place] 12/1 - GB sludge,  negative acute cholecystitis. Fatty liver. Small ascites/partially visualized right pleural effusion  TTE Echo 12/1 - Gr I diastolic dysfunction LE [place] 12/1 - No evidence DVT in either LE Micro Data:  11/30 Bld Cx - no growth 24 hrs 11/30 MRSA - negative  Antimicrobials:  01/26/2020 cefepime - 7 day course 01/26/2020 - d/c'd 12/1 vancomycin  Interim history/subjective:  Sedated currently, still requiring levophed and vasopressin.  + 14.9 L I/O Sedation of fentanyl, versed, and precedex, HR has improved. No longer tachycardic Synchronizing well with the vent.  A-line dislodged, inconsistent readings. Accurate readings align with cuff.   Objective:  Blood pressure 119/77, pulse 85, temperature 98.8 F (37.1 C), temperature source Core, resp. rate 20, height 5\' 10"  (1.778 m), weight 90.7 kg, SpO2 (!) 88 %. CVP:  [8 mmHg-14 mmHg] 8 mmHg  Vent Mode: PRVC FiO2 (%):  [40 %-70 %] 40 % Set Rate:  [28 bmp-35 bmp] 28 bmp Vt Set:  [510 mL] 510 mL PEEP:  [5 cmH20] 5 cmH20 Plateau Pressure:  [19 cmH20-23 cmH20] 23 cmH20   Intake/Output Summary (Last 24 hours) at 01/28/2020 1113 Last data filed at 01/28/2020 0900 Gross per 24 hour  Intake 5365.42 ml  Output 1985 ml  Net 3380.42 ml   Filed Weights   01/25/20 1257  Weight: 90.7 kg   Examination: General: sedated, synchronizing with vent HEENT: moist mucous membranes Neuro: sedated CV: regular rate, s1s2, no murmurs, gallops, rubs PULM: course breath sounds anteriorly.  GI: Hypoactive bowel sounds, soft, distended GU: Amber urine in catheter bag Extremities: warm/dry, no lower extremity edema Skin: no rashes or lesions  Assessment & Plan:  Shock PEA Cardiac Arrest Suspected etiology of severe pancreatitis. Do not suspect  infectious etiology or PE at this time. - Lower extremity dopplers are negative for DVT, TTE negative for clots - Covering with cefepime at this time. Vancomycin discontinued, MRSA screen negative - Will continue  attempt to wean levophed and vasopressin, MAP goal >65  Severe Pancreatitis Unknown etiology at this time. No reported alcohol or drug use. Triglycerides are 600 on initial check. Patient not on medications that could have this side effect. No dilation of biliary tract or concern for stones on CT abdomen or RUQUS. Low concern for abdominal compartment syndrome, abdomen soft, no worsening of distension from yesterday. - RUQUS no ductal dilation, no evidence of acute cholecystitis   - Continue insulin drip for elevated triglycerides, Tg trending downward 423 - LDH ordered - Na of 153, fluids changed to D5 - Start tube feeds today, will start with tube feeds - Consider GI consult if patient acutely worsens - Nutrition recs - vital 1.5 at 20 ml/hr. Increase 10 ml q8h. Goal 70 ml/h (1680 ml per day). Prosource TF 45 ml QID  Diabetic Ketoacidosis New onset in setting of pancreatititis - Trend BMP and beta-hydroxybutyrate - both improving - repeat CMP this afternoon - cont insulin drip and fluids - will stop D5 if insulin drip stops  Acute Hypoxemic Respiratory Failure In setting of cardiac arrest, shock and pancreatitis - Continue ventilator support, lung protective strategy - Fentanyl, versed, and precedex for sedation. Attempt to wean today and see how patient does. - If weaning well, will diurese in context of patient's continuous fluids and low urinary output with foley in place.  - VAP prevention protocol ordered  New Left Lower Opacity Suspicious for acute bacterial pneumonia  - Continue cefepime Start date 11/30, end date 12/6, 7 day course  - If not improving, will consider additional antibiotic coverage  Acute Kidney Injury Hypernatremia In setting of shock - switched to D5 - No acute issues noted on CT abdomen - Monitor renal function and UOP - Free water q4hr started via OG tube for hypernatremia - Hypernatremia in setting of aggressive fluid  resuscitation  Encephalopathy In setting of shock and pancreatitis - Continue sedation as above  Best Practice:  Diet: NPO. Trickle Tube Feeds Pain/Anxiety/Delirium protocol (if indicated): Fentanyl, precedex, versed VAP protocol (if indicated): Ordered DVT prophylaxis: SCD's / Lovenox. GI prophylaxis: PPI Glucose control: DKA protocol, endo-tool Mobility: Bedrest. Last date of multidisciplinary goals of care discussion: 01/27/20 at bedside with nurse and Dr. Francine Graven Family and staff present: Nurse and doctor Summary of discussion: Patient is full code. Continue aggressive care measures. Follow up goals of care discussion due: 02/02/20 Code Status: Full Family Communication: No family at bedside during examination Disposition: ICU  Labs   CBC: Recent Labs  Lab 01/25/20 1235 01/25/20 1600 01/25/20 1738 01/25/20 1738 01/26/20 0345 01/26/20 0345 01/26/20 1637 01/26/20 1637 01/26/20 2111 01/27/20 1049 01/27/20 1054 01/28/20 0258 01/28/20 0925  WBC 16.7*   < > 15.1*  --  17.4*  --  16.2*  --   --  17.9*  --  16.7*  --   NEUTROABS 13.6*  --   --   --   --   --   --   --   --   --   --   --   --   HGB 16.3   < > 16.0   < > 16.0   < > 14.0   < > 12.9* 14.8 13.9 13.3 11.9*  HCT 50.1   < > 50.3   < >  47.0   < > 39.8   < > 38.0* 43.7 41.0 38.1* 35.0*  MCV 94.0   < > 94.9  --  89.5  --  89.8  --   --  89.9  --  90.5  --   PLT 321   < > 261  --  209  --  149*  --   --  148*  --  126*  --    < > = values in this interval not displayed.   Basic Metabolic Panel: Recent Labs  Lab 01/26/20 2332 01/26/20 2332 01/27/20 0834 01/27/20 0834 01/27/20 1054 01/27/20 1416 01/27/20 2320 01/28/20 0258 01/28/20 0925  NA 147*   < > 151*   < > 151* 151* 150* 153* 153*  K 4.1   < > 3.8   < > 3.5 3.5 4.2 3.6 3.5  CL 111  --  113*  --   --  114* 113* 116*  --   CO2 19*  --  22  --   --  23 25 24   --   GLUCOSE 292*  --  128*  --   --  163* 181* 184*  --   BUN 87*  --  77*  --   --  76* 72*  65*  --   CREATININE 2.84*  --  2.29*  --   --  2.06* 1.92* 1.81*  --   CALCIUM 8.9  --  9.3  --   --  8.7* 8.4* 8.7*  --   MG  --   --  2.7*  --   --   --   --   --   --   PHOS  --   --  2.5  --   --   --   --   --   --    < > = values in this interval not displayed.   GFR: Estimated Creatinine Clearance: 59.6 mL/min (A) (by C-G formula based on SCr of 1.81 mg/dL (H)). Recent Labs  Lab 01/25/20 1738 01/26/20 0104 01/26/20 0345 01/26/20 0932 01/26/20 1615 01/26/20 1637 01/26/20 2035 01/26/20 2341 01/27/20 1049 01/28/20 0258  PROCALCITON  --  5.93  --   --   --   --   --   --   --   --   WBC   < >  --  17.4*  --   --  16.2*  --   --  17.9* 16.7*  LATICACIDVEN   < > 4.1* 3.3*   < > 6.8* 3.7* 2.4* 2.9*  --   --    < > = values in this interval not displayed.   Liver Function Tests: Recent Labs  Lab 01/25/20 1411 01/26/20 1637 01/27/20 1416 01/27/20 2320 01/28/20 0258  AST 30 152* 82* 73* 58*  ALT 36 50* 42 41 38  ALKPHOS 114 63 75 80 77  BILITOT 2.5* 1.1 1.1 1.6* 1.0  PROT 6.4* 4.2* 5.0* 4.9* 5.0*  ALBUMIN 3.2* 2.2* 2.0* 1.9* 1.8*   Recent Labs  Lab 01/26/20 1615  LIPASE 315*   No results for input(s): AMMONIA in the last 168 hours. ABG    Component Value Date/Time   PHART 7.480 (H) 01/28/2020 0925   PCO2ART 35.0 01/28/2020 0925   PO2ART 56 (L) 01/28/2020 0925   HCO3 26.1 01/28/2020 0925   TCO2 27 01/28/2020 0925   ACIDBASEDEF 5.0 (H) 01/26/2020 2111   O2SAT 91.0 01/28/2020 0925    Coagulation Profile: No  results for input(s): INR, PROTIME in the last 168 hours. Cardiac Enzymes: No results for input(s): CKTOTAL, CKMB, CKMBINDEX, TROPONINI in the last 168 hours. HbA1C: Hgb A1c MFr Bld  Date/Time Value Ref Range Status  01/25/2020 05:40 PM 13.4 (H) 4.8 - 5.6 % Final    Comment:    (NOTE) Pre diabetes:          5.7%-6.4%  Diabetes:              >6.4%  Glycemic control for   <7.0% adults with diabetes    CBG: Recent Labs  Lab 01/28/20 0238  01/28/20 0426 01/28/20 0627 01/28/20 0827 01/28/20 1025  GLUCAP 175* 168* 165* 155* 147*    Bryan [name] D.O. Internal Medicine Resident PGY-1 [facility]

## 2020-01-28 NOTE — Progress Notes (Signed)
Patient noted to have bilateral eye deviation, upward and to the left. No other neurological deficits appreciated, CT head w/o contrast ordered.   Patient's mother, Ms. Simmons at beside. Updated her of plan today to attempt to reduce sedating medications and start trickle feeds.

## 2020-01-28 NOTE — Plan of Care (Signed)

## 2020-01-28 NOTE — Progress Notes (Signed)
Nutrition Follow-up  DOCUMENTATION CODES:   Not applicable  INTERVENTION:   Initiate tube feeds via OG tube: - Vital 1.5 @ 20 ml/hr (480 ml/day) - Free water flushes per MD, current 200 ml q 4 hours  Trickle tube feeding regimen provides 720 kcal, 32 grams of protein, and 367 ml of H2O (meets 28% of estimated kcal and 23% of estimated protein needs).  Total free water with current flushes: 1567 ml  - If pt does not tolerate trickle TF via OG tube, consider Cortrak placement with tube tip at ligament of Treitz.  - As medical status allows, recommend advancing tube feeds by 10 ml q 8 hours to goal rate of 70 ml/hr (1680 ml/day) with ProSource TF 45 ml QID.  Recommended tube feeding regimen at goal provides 2680 kcal, 157 grams of protein, and 1284 ml H2O daily.  NUTRITION DIAGNOSIS:   Increased nutrient needs related to acute illness (pancreatitis) as evidenced by estimated needs.  Ongoing  GOAL:   Patient will meet greater than or equal to 90% of their needs  Unmet at this time but progressing with initiation of trickle tube feeds  MONITOR:   Vent status, Labs, Weight trends, TF tolerance, I & O's  REASON FOR ASSESSMENT:   Consult Enteral/tube feeding initiation and management (trickle tube feeds)  ASSESSMENT:   44 yo male admitted with AMS, new onset DM with DKA. PMH includes MDD, paranoid schizophrenia, HTN, smoker, drug use (cocaine, marijuana), heavy alcohol use.  Discussed pt with RN and during ICU rounds. Plan is to start trickle tube feeds today. Discussed plan with pt's mother at bedside.  Pt continues to require pressor support.  Pt with free water flushes of 200 ml q 4 hours currently ordered.  Patient remained intubated on ventilator support MV: 15.6 L/min Temp (24hrs), Avg:98.9 F (37.2 C), Min:98.1 F (36.7 C), Max:99.8 F (37.7 C) BP (cuff): 134/84 MAP (cuff):  97  Drips: Precedex Fentanyl Versed Levophed Vasopressin D5 Insulin  Medications reviewed and include: colace, protonix, miralax, klor-con, IV abx  Labs reviewed: sodium 153, BUN 65, creatinine 1.82 CBG's: 147-177 x 12 hours  UOP: 1535 ml x 24 hours NGT: 250 ml x 24 hours I/O's: +14.9 L since admit  Diet Order:   Diet Order            Diet NPO time specified  Diet effective now                 EDUCATION NEEDS:   Not appropriate for education at this time  Skin:  Skin Assessment: Reviewed RN Assessment  Last BM:  no documented BM  Height:   Ht Readings from Last 1 Encounters:  01/25/20 5\' 10"  (1.778 m)    Weight:   Wt Readings from Last 1 Encounters:  01/25/20 90.7 kg    Ideal Body Weight:  75.5 kg  BMI:  Body mass index is 28.7 kg/m.  Estimated Nutritional Needs:   Kcal:  2600  Protein:  140-160 gm  Fluid:  2.5 L    01/27/20, MS, RD, LDN Inpatient Clinical Dietitian Please see AMiON for contact information.

## 2020-01-28 NOTE — Progress Notes (Signed)
Transported pt from 2M01 to CT scan and back without complication.

## 2020-01-29 ENCOUNTER — Inpatient Hospital Stay (HOSPITAL_COMMUNITY): Payer: Medicaid Other

## 2020-01-29 DIAGNOSIS — K859 Acute pancreatitis without necrosis or infection, unspecified: Secondary | ICD-10-CM | POA: Diagnosis not present

## 2020-01-29 DIAGNOSIS — J9601 Acute respiratory failure with hypoxia: Secondary | ICD-10-CM

## 2020-01-29 DIAGNOSIS — R609 Edema, unspecified: Secondary | ICD-10-CM | POA: Diagnosis not present

## 2020-01-29 DIAGNOSIS — E0811 Diabetes mellitus due to underlying condition with ketoacidosis with coma: Secondary | ICD-10-CM | POA: Diagnosis not present

## 2020-01-29 LAB — GLUCOSE, CAPILLARY
Glucose-Capillary: 158 mg/dL — ABNORMAL HIGH (ref 70–99)
Glucose-Capillary: 165 mg/dL — ABNORMAL HIGH (ref 70–99)
Glucose-Capillary: 167 mg/dL — ABNORMAL HIGH (ref 70–99)
Glucose-Capillary: 170 mg/dL — ABNORMAL HIGH (ref 70–99)
Glucose-Capillary: 170 mg/dL — ABNORMAL HIGH (ref 70–99)
Glucose-Capillary: 177 mg/dL — ABNORMAL HIGH (ref 70–99)
Glucose-Capillary: 177 mg/dL — ABNORMAL HIGH (ref 70–99)
Glucose-Capillary: 179 mg/dL — ABNORMAL HIGH (ref 70–99)
Glucose-Capillary: 181 mg/dL — ABNORMAL HIGH (ref 70–99)
Glucose-Capillary: 268 mg/dL — ABNORMAL HIGH (ref 70–99)
Glucose-Capillary: 272 mg/dL — ABNORMAL HIGH (ref 70–99)

## 2020-01-29 LAB — CBC
HCT: 34 % — ABNORMAL LOW (ref 39.0–52.0)
Hemoglobin: 10.9 g/dL — ABNORMAL LOW (ref 13.0–17.0)
MCH: 30.2 pg (ref 26.0–34.0)
MCHC: 32.1 g/dL (ref 30.0–36.0)
MCV: 94.2 fL (ref 80.0–100.0)
Platelets: 127 10*3/uL — ABNORMAL LOW (ref 150–400)
RBC: 3.61 MIL/uL — ABNORMAL LOW (ref 4.22–5.81)
RDW: 14.6 % (ref 11.5–15.5)
WBC: 17.2 10*3/uL — ABNORMAL HIGH (ref 4.0–10.5)
nRBC: 0.6 % — ABNORMAL HIGH (ref 0.0–0.2)

## 2020-01-29 LAB — BASIC METABOLIC PANEL
Anion gap: 12 (ref 5–15)
BUN: 49 mg/dL — ABNORMAL HIGH (ref 6–20)
CO2: 24 mmol/L (ref 22–32)
Calcium: 8.4 mg/dL — ABNORMAL LOW (ref 8.9–10.3)
Chloride: 111 mmol/L (ref 98–111)
Creatinine, Ser: 1.48 mg/dL — ABNORMAL HIGH (ref 0.61–1.24)
GFR, Estimated: 60 mL/min — ABNORMAL LOW (ref >60)
Glucose, Bld: 189 mg/dL — ABNORMAL HIGH (ref 70–99)
Potassium: 3.7 mmol/L (ref 3.5–5.1)
Sodium: 147 mmol/L — ABNORMAL HIGH (ref 135–145)

## 2020-01-29 LAB — BETA-HYDROXYBUTYRIC ACID: Beta-Hydroxybutyric Acid: 0.23 mmol/L (ref 0.05–0.27)

## 2020-01-29 LAB — PHOSPHORUS
Phosphorus: 1.9 mg/dL — ABNORMAL LOW (ref 2.5–4.6)
Phosphorus: 2.3 mg/dL — ABNORMAL LOW (ref 2.5–4.6)

## 2020-01-29 LAB — MAGNESIUM
Magnesium: 2.4 mg/dL (ref 1.7–2.4)
Magnesium: 2.3 mg/dL (ref 1.7–2.4)

## 2020-01-29 LAB — C-REACTIVE PROTEIN: CRP: 46.3 mg/dL — ABNORMAL HIGH (ref <1.0)

## 2020-01-29 LAB — TRIGLYCERIDES: Triglycerides: 434 mg/dL — ABNORMAL HIGH (ref <150)

## 2020-01-29 LAB — LIPASE, BLOOD: Lipase: 24 U/L (ref 11–51)

## 2020-01-29 MED ORDER — K PHOS MONO-SOD PHOS DI & MONO 155-852-130 MG PO TABS
250.0000 mg | ORAL_TABLET | Freq: Three times a day (TID) | ORAL | Status: DC
  Administered 2020-01-29: 250 mg via ORAL
  Filled 2020-01-29: qty 1

## 2020-01-29 MED ORDER — K PHOS MONO-SOD PHOS DI & MONO 155-852-130 MG PO TABS
250.0000 mg | ORAL_TABLET | Freq: Three times a day (TID) | ORAL | Status: AC
  Administered 2020-01-29 (×2): 250 mg
  Filled 2020-01-29 (×2): qty 1

## 2020-01-29 MED ORDER — INSULIN ASPART 100 UNIT/ML ~~LOC~~ SOLN
7.0000 [IU] | SUBCUTANEOUS | Status: DC
  Administered 2020-01-29 (×2): 7 [IU] via SUBCUTANEOUS

## 2020-01-29 MED ORDER — INSULIN ASPART 100 UNIT/ML ~~LOC~~ SOLN: 2.0000 [IU] | SUBCUTANEOUS | Status: DC

## 2020-01-29 MED ORDER — INSULIN DETEMIR 100 UNIT/ML ~~LOC~~ SOLN
22.0000 [IU] | Freq: Two times a day (BID) | SUBCUTANEOUS | Status: DC
  Administered 2020-01-29 – 2020-01-30 (×2): 22 [IU] via SUBCUTANEOUS
  Filled 2020-01-29 (×3): qty 0.22

## 2020-01-29 MED ORDER — INSULIN ASPART 100 UNIT/ML ~~LOC~~ SOLN
7.0000 [IU] | Freq: Once | SUBCUTANEOUS | Status: AC
  Administered 2020-01-29: 7 [IU] via SUBCUTANEOUS

## 2020-01-29 MED ORDER — INSULIN ASPART 100 UNIT/ML ~~LOC~~ SOLN
0.0000 [IU] | SUBCUTANEOUS | Status: DC
  Administered 2020-01-29: 8 [IU] via SUBCUTANEOUS
  Administered 2020-01-30: 3 [IU] via SUBCUTANEOUS
  Administered 2020-01-30: 5 [IU] via SUBCUTANEOUS
  Administered 2020-01-30: 3 [IU] via SUBCUTANEOUS
  Administered 2020-01-30: 2 [IU] via SUBCUTANEOUS
  Administered 2020-01-30: 3 [IU] via SUBCUTANEOUS
  Administered 2020-01-30 – 2020-01-31 (×2): 5 [IU] via SUBCUTANEOUS
  Administered 2020-01-31: 2 [IU] via SUBCUTANEOUS
  Administered 2020-01-31: 3 [IU] via SUBCUTANEOUS
  Administered 2020-01-31: 5 [IU] via SUBCUTANEOUS
  Administered 2020-02-01: 3 [IU] via SUBCUTANEOUS
  Administered 2020-02-02: 8 [IU] via SUBCUTANEOUS
  Administered 2020-02-02: 3 [IU] via SUBCUTANEOUS
  Administered 2020-02-02 (×2): 8 [IU] via SUBCUTANEOUS
  Administered 2020-02-03: 5 [IU] via SUBCUTANEOUS
  Administered 2020-02-03 (×2): 3 [IU] via SUBCUTANEOUS
  Administered 2020-02-03 – 2020-02-04 (×3): 2 [IU] via SUBCUTANEOUS
  Administered 2020-02-04: 3 [IU] via SUBCUTANEOUS
  Administered 2020-02-04: 2 [IU] via SUBCUTANEOUS
  Administered 2020-02-04: 5 [IU] via SUBCUTANEOUS
  Administered 2020-02-04 – 2020-02-06 (×8): 3 [IU] via SUBCUTANEOUS
  Administered 2020-02-06: 20:00:00 5 [IU] via SUBCUTANEOUS
  Administered 2020-02-06 (×2): 2 [IU] via SUBCUTANEOUS
  Administered 2020-02-06: 12:00:00 3 [IU] via SUBCUTANEOUS
  Administered 2020-02-06: 16:00:00 5 [IU] via SUBCUTANEOUS
  Administered 2020-02-07: 16:00:00 8 [IU] via SUBCUTANEOUS
  Administered 2020-02-07 (×2): 5 [IU] via SUBCUTANEOUS
  Administered 2020-02-07: 04:00:00 3 [IU] via SUBCUTANEOUS
  Administered 2020-02-07: 12:00:00 15 [IU] via SUBCUTANEOUS
  Administered 2020-02-08: 20:00:00 2 [IU] via SUBCUTANEOUS
  Administered 2020-02-08: 13:00:00 5 [IU] via SUBCUTANEOUS
  Administered 2020-02-08: 16:00:00 3 [IU] via SUBCUTANEOUS
  Administered 2020-02-08 (×2): 5 [IU] via SUBCUTANEOUS
  Administered 2020-02-08 (×2): 3 [IU] via SUBCUTANEOUS
  Administered 2020-02-09: 04:00:00 5 [IU] via SUBCUTANEOUS
  Administered 2020-02-09 (×2): 3 [IU] via SUBCUTANEOUS
  Administered 2020-02-09 – 2020-02-11 (×5): 2 [IU] via SUBCUTANEOUS
  Administered 2020-02-11: 17:00:00 3 [IU] via SUBCUTANEOUS
  Administered 2020-02-11 – 2020-02-12 (×5): 2 [IU] via SUBCUTANEOUS
  Administered 2020-02-13 – 2020-02-14 (×3): 3 [IU] via SUBCUTANEOUS
  Administered 2020-02-14: 20:00:00 2 [IU] via SUBCUTANEOUS
  Administered 2020-02-14: 12:00:00 3 [IU] via SUBCUTANEOUS
  Administered 2020-02-14: 04:00:00 5 [IU] via SUBCUTANEOUS
  Administered 2020-02-14 (×2): 2 [IU] via SUBCUTANEOUS
  Administered 2020-02-15: 5 [IU] via SUBCUTANEOUS
  Administered 2020-02-15: 04:00:00 2 [IU] via SUBCUTANEOUS
  Administered 2020-02-15: 08:00:00 3 [IU] via SUBCUTANEOUS
  Administered 2020-02-16 – 2020-02-17 (×4): 2 [IU] via SUBCUTANEOUS
  Administered 2020-02-17: 12:00:00 5 [IU] via SUBCUTANEOUS
  Administered 2020-02-17: 04:00:00 2 [IU] via SUBCUTANEOUS
  Administered 2020-02-17: 15:00:00 3 [IU] via SUBCUTANEOUS
  Administered 2020-02-18: 08:00:00 2 [IU] via SUBCUTANEOUS
  Administered 2020-02-18: 20:00:00 3 [IU] via SUBCUTANEOUS
  Administered 2020-02-18: 2 [IU] via SUBCUTANEOUS
  Administered 2020-02-18: 13:00:00 3 [IU] via SUBCUTANEOUS
  Administered 2020-02-18: 03:00:00 2 [IU] via SUBCUTANEOUS
  Administered 2020-02-19 (×2): 3 [IU] via SUBCUTANEOUS
  Administered 2020-02-19: 03:00:00 2 [IU] via SUBCUTANEOUS
  Administered 2020-02-19: 3 [IU] via SUBCUTANEOUS
  Administered 2020-02-19: 08:00:00 2 [IU] via SUBCUTANEOUS
  Administered 2020-02-20 (×4): 3 [IU] via SUBCUTANEOUS
  Administered 2020-02-20: 08:00:00 2 [IU] via SUBCUTANEOUS
  Administered 2020-02-20: 12:00:00 3 [IU] via SUBCUTANEOUS
  Administered 2020-02-21: 08:00:00 2 [IU] via SUBCUTANEOUS
  Administered 2020-02-21: 3 [IU] via SUBCUTANEOUS
  Administered 2020-02-21 – 2020-02-22 (×6): 2 [IU] via SUBCUTANEOUS
  Administered 2020-02-22: 18:00:00 3 [IU] via SUBCUTANEOUS
  Administered 2020-02-23 (×4): 2 [IU] via SUBCUTANEOUS
  Administered 2020-02-24: 10:00:00 3 [IU] via SUBCUTANEOUS
  Administered 2020-02-24 (×3): 2 [IU] via SUBCUTANEOUS
  Administered 2020-02-24 – 2020-02-25 (×3): 3 [IU] via SUBCUTANEOUS
  Administered 2020-02-25 (×2): 2 [IU] via SUBCUTANEOUS
  Administered 2020-02-25 – 2020-02-26 (×3): 3 [IU] via SUBCUTANEOUS
  Administered 2020-02-26: 2 [IU] via SUBCUTANEOUS
  Administered 2020-02-26: 3 [IU] via SUBCUTANEOUS
  Administered 2020-02-26: 2 [IU] via SUBCUTANEOUS
  Administered 2020-02-27 (×3): 3 [IU] via SUBCUTANEOUS
  Administered 2020-02-27 – 2020-02-28 (×2): 2 [IU] via SUBCUTANEOUS
  Administered 2020-02-28: 3 [IU] via SUBCUTANEOUS
  Administered 2020-02-28: 2 [IU] via SUBCUTANEOUS
  Administered 2020-02-28: 3 [IU] via SUBCUTANEOUS
  Administered 2020-02-28: 2 [IU] via SUBCUTANEOUS
  Administered 2020-02-28: 3 [IU] via SUBCUTANEOUS
  Administered 2020-02-29 (×2): 2 [IU] via SUBCUTANEOUS
  Administered 2020-02-29 (×3): 3 [IU] via SUBCUTANEOUS
  Administered 2020-03-01 (×2): 2 [IU] via SUBCUTANEOUS
  Administered 2020-03-01: 3 [IU] via SUBCUTANEOUS
  Administered 2020-03-01: 2 [IU] via SUBCUTANEOUS
  Administered 2020-03-01: 3 [IU] via SUBCUTANEOUS
  Administered 2020-03-01: 2 [IU] via SUBCUTANEOUS
  Administered 2020-03-02: 3 [IU] via SUBCUTANEOUS
  Administered 2020-03-02: 2 [IU] via SUBCUTANEOUS
  Administered 2020-03-02: 3 [IU] via SUBCUTANEOUS
  Administered 2020-03-03 (×3): 2 [IU] via SUBCUTANEOUS
  Administered 2020-03-03 – 2020-03-04 (×2): 3 [IU] via SUBCUTANEOUS
  Administered 2020-03-04: 2 [IU] via SUBCUTANEOUS

## 2020-01-29 MED ORDER — INSULIN ASPART 100 UNIT/ML ~~LOC~~ SOLN
1.0000 [IU] | SUBCUTANEOUS | Status: DC
  Administered 2020-01-29 (×2): 2 [IU] via SUBCUTANEOUS

## 2020-01-29 MED ORDER — VITAL 1.5 CAL PO LIQD
1000.0000 mL | ORAL | Status: DC
  Administered 2020-01-29 – 2020-02-15 (×19): 1000 mL
  Filled 2020-01-29 (×12): qty 1000

## 2020-01-29 MED ORDER — INSULIN ASPART 100 UNIT/ML ~~LOC~~ SOLN
7.0000 [IU] | SUBCUTANEOUS | Status: DC
  Administered 2020-01-29 – 2020-01-30 (×4): 7 [IU] via SUBCUTANEOUS

## 2020-01-29 MED ORDER — DEXTROSE 10 % IV SOLN: INTRAVENOUS | Status: DC | PRN

## 2020-01-29 MED ORDER — FUROSEMIDE 10 MG/ML IJ SOLN
40.0000 mg | Freq: Four times a day (QID) | INTRAMUSCULAR | Status: AC
  Administered 2020-01-29 (×3): 40 mg via INTRAVENOUS
  Filled 2020-01-29 (×3): qty 4

## 2020-01-29 MED ORDER — INSULIN DETEMIR 100 UNIT/ML ~~LOC~~ SOLN
22.0000 [IU] | Freq: Two times a day (BID) | SUBCUTANEOUS | Status: DC
  Administered 2020-01-29: 22 [IU] via SUBCUTANEOUS
  Filled 2020-01-29 (×2): qty 0.22

## 2020-01-29 MED ORDER — INSULIN DETEMIR 100 UNIT/ML ~~LOC~~ SOLN
22.0000 [IU] | Freq: Two times a day (BID) | SUBCUTANEOUS | Status: DC
  Filled 2020-01-29: qty 0.22

## 2020-01-29 MED ORDER — PROSOURCE TF PO LIQD
45.0000 mL | Freq: Four times a day (QID) | ORAL | Status: DC
  Administered 2020-01-29 – 2020-02-16 (×73): 45 mL
  Filled 2020-01-29 (×74): qty 45

## 2020-01-29 MED ORDER — INSULIN ASPART 100 UNIT/ML ~~LOC~~ SOLN: 7.0000 [IU] | SUBCUTANEOUS | Status: DC

## 2020-01-29 NOTE — Progress Notes (Signed)
Nutrition Follow-up  DOCUMENTATION CODES:   Not applicable  INTERVENTION:   TF via OG tube: Increase Vital 1.5 by 10 ml every 8 hours to goal rate of 60 ml/h (1440 ml per day) Prosource TF 45 ml QID Provides 2320 kcal, 141 gm protein, 1100 ml free water daily.  Recommend obtain current weight.  Continue to monitor phosphorus level and replete as necessary.   NUTRITION DIAGNOSIS:   Increased nutrient needs related to acute illness (pancreatitis) as evidenced by estimated needs.  Ongoing   GOAL:   Patient will meet greater than or equal to 90% of their needs  Progressing   MONITOR:   Vent status, Labs, Weight trends, TF tolerance, I & O's  REASON FOR ASSESSMENT:   Consult Enteral/tube feeding initiation and management (trickle tube feeds)  ASSESSMENT:   44 yo male admitted with AMS, new onset DM with DKA. PMH includes MDD, paranoid schizophrenia, HTN, smoker, drug use (cocaine, marijuana), heavy alcohol use.  Tube feeding was started 12/2. Currently receiving Vital 1.5 via OGT at 20 ml/h (480 ml/day). Tolerating well with plans for RD to increase feedings today.   Refeeding labs reviewed. Potassium 3.4 slightly low 12/2 afternoon, now WNL at 3.7. Phosphorus 1.6 low on 12/2, up to 1.9 today, but still low. Has been receiving K Phos and KCl for repletion.  Patient is currently intubated on ventilator support MV: 14.5 L/min Temp (24hrs), Avg:99 F (37.2 C), Min:98.5 F (36.9 C), Max:99.8 F (37.7 C)   Labs reviewed. Sodium 147, K 3.4-->3.8-->3.7; Phos 1.6-->1.9 CBG: [phone]  Medications reviewed and include vasopressin, levophed, precedex, insulin drip.  No new weight since 11/29, which appears to be an estimated weight at exactly 200 lb.   Diet Order:   Diet Order            Diet NPO time specified  Diet effective now                 EDUCATION NEEDS:   Not appropriate for education at this time  Skin:  Skin Assessment: Reviewed RN  Assessment  Last BM:  no documented BM  Height:   Ht Readings from Last 1 Encounters:  01/25/20 5\' 10"  (1.778 m)    Weight:   Wt Readings from Last 1 Encounters:  01/25/20 90.7 kg    Ideal Body Weight:  75.5 kg  BMI:  Body mass index is 28.7 kg/m.  Estimated Nutritional Needs:   Kcal:  2275  Protein:  135-160 gm  Fluid:  2.5 L    2276, RD, LDN, CNSC Please refer to Amion for contact information.

## 2020-01-29 NOTE — Progress Notes (Addendum)
NAME:  Bryan [name], MRN:  [id number], DOB:  1975/10/28, LOS: 4 ADMISSION DATE:  01/25/2020, CONSULTATION DATE:  01/25/20 REFERRING MD:  Claudette Laws CHIEF COMPLAINT:  AMS    Brief History   Bryan [name] is a 44 y.o. male admitted w/ DKA on 11/29.  PCCM consulted 11/30 for agitation and borderline hypotension. Patient later had PEA cardiac arrest, 2 rounds of CPR when ROSC achieved, patient intubated. Transferred to ICU, CVL and arterial line placed.   CT Head is unremarkable except for right sinus disease. CT Chest/Abdomen/Pelvis showed evidence of moderate acute pancreatitis, mild ascites and small bilateral pleural effusions. Lipase of 315U/L on 01/26/20. No initial clear etiology for pancreatitis, mother denies patient using alcohol, CT a/p negative for dilated biliary ducts or gall stones. Triglycerides found to be 600.    Past Medical History   Past Medical History:  Diagnosis Date  . Hypertension   . Psychiatric problem    Seen at  [facility] for unknown reason. Takes seroquel. History of hearing voices.  Not commandivng voices.   . Schizophrenia (HCC)   . Tremors of nervous system     Significant Hospital Events   11/29 - admitted 11/30 - PEA Arrest and transfer to ICU  Consults:  PCCM  Procedures:  11/30 ETT 11/30 A line, removed 01/28/20 11/30 Left IJ CVL 11/30 NG tube placed  Significant Diagnostic Tests:  CXR 11/29 - neg.  CT Head 12/1 - right sinus disease, no acute intracranial pathology CT Chest/Abd/Pel 12/1 -moderate pancreatis, mild asicites. Small bilateral pleural effusions Abd x-ray 12/1 - NG tube tip and side port in proximal stomach. No bowel obstruction or free air CXR 12/1 - No pneumothorax, developing pneumonia left lower lobe. Mild bibasilar atelectasis. RUQ [place] 12/1 - GB sludge, negative acute cholecystitis. Fatty liver. Small ascites/partially visualized right pleural effusion  TTE Echo 12/1 - Gr I diastolic dysfunction LE [place] 12/1 - No evidence  DVT in either LE  CXR 01/29/20 - Lines and tubes in stable position. New infiltrate right lung base. Persistent bibasilar atelectasis.  Micro Data:  11/30 Bld Cx - no growth 48 hrs 11/30 MRSA - negative  Antimicrobials:  01/26/2020 cefepime - Day 4/7 01/26/2020 - d/c'd 12/1 vancomycin  Interim history/subjective:  Nursing note required increasing sedation, due to asynchrony from vent. versed increased. Left IV removed, appeared to be infiltrated, patient with taut skin of left forearm. Minimal urinary output.  Objective   Blood pressure (!) 126/93, pulse 76, temperature 98.5 F (36.9 C), temperature source Core, resp. rate (!) 30, height 5\' 10"  (1.778 m), weight 90.7 kg, SpO2 94 %. CVP:  [6 mmHg-9 mmHg] 7 mmHg  Vent Mode: PRVC FiO2 (%):  [40 %-75 %] 75 % Set Rate:  [28 bmp-35 bmp] 28 bmp Vt Set:  [510 mL] 510 mL PEEP:  [5 cmH20] 5 cmH20 Plateau Pressure:  [19 cmH20-23 cmH20] 19 cmH20   Intake/Output Summary (Last 24 hours) at 01/29/2020 0652 Last data filed at 01/29/2020 0600 Gross per 24 hour  Intake 6027.1 ml  Output 1950 ml  Net 4077.1 ml   Filed Weights   01/25/20 1257  Weight: 90.7 kg    Examination: General: Sedated, synchronous with vent. HENT: mucous membranes moist. Pupils pinpoint Lungs: Coarse breath sounds anteriorly Cardiovascular: Normal rate/rhythm no murmurs, gallops, rubs. Abdomen: Distended, Soft. No bowel sounds appreciated.  Extremities: left forearm taut. Weak left radial pulse  Neuro: Sedated Skin: Lower extremities cool. No rash or lesions. Left CVL clean dry intact GU: foley catheter  in place. Amber urine in bag.   Assessment & Plan:  Shock PEA Cardiac Arrest Suspected etiology is severe pancreatitis. Do not suspect infectious etiology or PE at this time. - Lower extremity dopplers are negative for DVT, TTE negative for clots - TTE 12/1 negative for cardiac etiology - Covering with cefepime. Vancomycin discontinued, MRSA screen  negative - Multiple attempts yesterday to wean levophed and vasopressin with patient clamping on tube at lower doses.  - Plan today to wean patient off of pressors and sedatives.   Severe Pancreatitis Unknown etiology at this time. No reported alcohol or drug use. Triglycerides are 600 on initial check. Patient not on medications that could have this side effect. No dilation of biliary tract or concern for stones on CT abdomen or RUQUS. Low concern for abdominal compartment syndrome, abdomen soft, no worsening of distension from yesterday. Yesterday family did note a history of pancreatitis in 2 family members, father unsure of etiology. - RUQUS no ductal dilation, no evidence of acute cholecystitis   - Tg trending downward, patient to be taken off insulin drip and transitioned to SSI - LDH 312 - Ranson score of 1, 1% predicted mortality  - Na of 147. DC fluids today, tube feeds started yesterday - Consider GI consult if patient acutely worsens - Nutrition recs - vital 1.5 at 20 ml/hr. Increase 10 ml q8h. Goal 70 ml/h (1680 ml per day). Prosource TF 45 ml QID  Diabetic Ketoacidosis New onset in setting of pancreatititis - Beta-hydroxybuterate and acidosis improving. Will transition from insulin drip to long acting/short acting insulin with meals and SSI. Appreciate pharmacy's assistance - Discontinuing D5 today after transition off of insulin drip  Acute Hypoxemic Respiratory Failure In setting of cardiac arrest, shock and pancreatitis - Patient requiring fentanyl, versed, and precedex for sedation. Believe patient is requiring increasing doses of sedatives due to building of tolerance. Hence leading to desynchrony on vent requiring increase in sedatives.  - Will steadily wean patient - VAP prevention protocol ordered  New Right Lower Opacity New right lower opacity seen on x-ray this am.  - Continue cefepime Start date 11/30, end date 12/6, 7 day course  - If not improving, will  consider additional antibiotic coverage  Acute Kidney Injury Hypernatremia In setting of shock. Hypernatremia in setting of aggressive fluid resuscitation.  - switched to D5, will discontinue today as tube feeds started - Cr improving - No acute issues noted on CT abdomen - Foley catheter removed, patient with free flowing urine. Suspect catheter causing spasms.  - Frequent bladder scans, if no urine output consider anti-spasmatic's and reinserting foley.   Left Forearm Swelling Infiltrated left IV line. Removed yesterday. - Left forearm taut on examination, weak left radial pulse  - Left arm US today  Encephalopathy In setting of shock and pancreatitis - Continue sedation as above  Best practice:  Diet: NPO. Trickle Tube Feeds Pain/Anxiety/Delirium protocol (if indicated): Fentanyl, precedex, versed. Wean today VAP protocol (if indicated): Ordered DVT prophylaxis: SCD's / Lovenox. GI prophylaxis: PPI Glucose control: DKA protocol, endo-tool Mobility: Bedrest. Last date of multidisciplinary goals of care discussion: 01/27/20 at bedside with nurse and Dr. Francine Gravenewald Family and staff present: Nurse and doctor Summary of discussion: Patient is full code. Continue aggressive care measures. Follow up goals of care discussion due: 02/02/20 Code Status: Full Family Communication: Mother/Father at bedside yesterday Disposition: ICU  Labs   CBC: Recent Labs  Lab 01/25/20 1235 01/25/20 1600 01/26/20 0345 01/26/20 0345 01/26/20 1637 01/26/20 2111  01/27/20 1049 01/27/20 1049 01/27/20 1054 01/28/20 0258 01/28/20 0925 01/28/20 2354 01/29/20 0253  WBC 16.7*   < > 17.4*  --  16.2*  --  17.9*  --   --  16.7*  --   --  17.2*  NEUTROABS 13.6*  --   --   --   --   --   --   --   --   --   --   --   --   HGB 16.3   < > 16.0   < > 14.0   < > 14.8   < > 13.9 13.3 11.9* 12.9* 10.9*  HCT 50.1   < > 47.0   < > 39.8   < > 43.7   < > 41.0 38.1* 35.0* 38.0* 34.0*  MCV 94.0   < > 89.5  --   89.8  --  89.9  --   --  90.5  --   --  94.2  PLT 321   < > 209  --  149*  --  148*  --   --  126*  --   --  127*   < > = values in this interval not displayed.    Basic Metabolic Panel: Recent Labs  Lab 01/27/20 0834 01/27/20 1054 01/27/20 1416 01/27/20 1416 01/27/20 2320 01/27/20 2320 01/28/20 0258 01/28/20 0925 01/28/20 1435 01/28/20 1714 01/28/20 2354 01/29/20 0253  NA 151*   < > 151*   < > 150*   < > 153* 153* 149*  --  151* 147*  K 3.8   < > 3.5   < > 4.2   < > 3.6 3.5 3.4*  --  3.8 3.7  CL 113*   < > 114*  --  113*  --  116*  --  120*  --   --  111  CO2 22   < > 23  --  25  --  24  --  23  --   --  24  GLUCOSE 128*   < > 163*  --  181*  --  184*  --  171*  --   --  189*  BUN 77*   < > 76*  --  72*  --  65*  --  51*  --   --  49*  CREATININE 2.29*   < > 2.06*  --  1.92*  --  1.81*  --  1.48*  --   --  1.48*  CALCIUM 9.3   < > 8.7*  --  8.4*  --  8.7*  --  7.2*  --   --  8.4*  MG 2.7*  --   --   --   --   --   --   --   --  2.2  --  2.4  PHOS 2.5  --   --   --   --   --   --   --   --  1.6*  --  1.9*   < > = values in this interval not displayed.   GFR: Estimated Creatinine Clearance: 72.9 mL/min (A) (by C-G formula based on SCr of 1.48 mg/dL (H)). Recent Labs  Lab 01/26/20 0104 01/26/20 0345 01/26/20 1615 01/26/20 1637 01/26/20 2035 01/26/20 2341 01/27/20 1049 01/28/20 0258 01/29/20 0253  PROCALCITON 5.93  --   --   --   --   --   --   --   --   WBC  --    < >  --  16.2*  --   --  17.9* 16.7* 17.2*  LATICACIDVEN 4.1*   < > 6.8* 3.7* 2.4* 2.9*  --   --   --    < > = values in this interval not displayed.    Liver Function Tests: Recent Labs  Lab 01/26/20 1637 01/27/20 1416 01/27/20 2320 01/28/20 0258 01/28/20 1435  AST 152* 82* 73* 58* 47*  ALT 50* 42 41 38 34  ALKPHOS 63 75 80 77 75  BILITOT 1.1 1.1 1.6* 1.0 1.0  PROT 4.2* 5.0* 4.9* 5.0* 4.6*  ALBUMIN 2.2* 2.0* 1.9* 1.8* 1.6*   Recent Labs  Lab 01/26/20 1615 01/29/20 0253  LIPASE 315* 24    No results for input(s): AMMONIA in the last 168 hours.  ABG    Component Value Date/Time   PHART 7.448 01/28/2020 2354   PCO2ART 36.7 01/28/2020 2354   PO2ART 76 (L) 01/28/2020 2354   HCO3 25.4 01/28/2020 2354   TCO2 26 01/28/2020 2354   ACIDBASEDEF 5.0 (H) 01/26/2020 2111   O2SAT 96.0 01/28/2020 2354     Coagulation Profile: No results for input(s): INR, PROTIME in the last 168 hours.  Cardiac Enzymes: No results for input(s): CKTOTAL, CKMB, CKMBINDEX, TROPONINI in the last 168 hours.  HbA1C: Hgb A1c MFr Bld  Date/Time Value Ref Range Status  01/25/2020 05:40 PM 13.4 (H) 4.8 - 5.6 % Final    Comment:    (NOTE) Pre diabetes:          5.7%-6.4%  Diabetes:              >6.4%  Glycemic control for   <7.0% adults with diabetes     CBG: Recent Labs  Lab 01/28/20 2242 01/28/20 2343 01/29/20 0048 01/29/20 0249 01/29/20 0459  GLUCAP 178* 168* 170* 179* 158*    Vasili Katsadouros Internal Medicine Resident PGY-1 [place]   Patient seen in conjunction with medical resident documentation as above.  This is a 44 year old gentleman admitted for new diagnosis DKA, hyperglycemic encephalopathic found to have pancreatitis developed worsening respiratory failure intubated on mechanical life support.  On multiple vasopressors now.  Also having issues with ventilator dyssynchrony and sedation requirements on mechanical support.  On admission to ICU patient did have a PEA cardiac arrest lower extremity Dopplers were negative ejection fraction normal on echo with grade 1 diastolic dysfunction and started on broad-spectrum antibiotics.  No reported alcohol or drug use.  BP 124/87   Pulse 76   Temp 99 F (37.2 C) (Rectal)   Resp (!) 30   Ht  (1.778 m)   Wt 90.7 kg   SpO2 92%   BMI 28.70 kg/m   General: Young male intubated mechanical life support critically ill Heart: Regular rhythm S1-S2 Lungs: Bilateral mechanically ventilated breath sounds Abdomen:  Distended Extremities: Dependent edema and flanks lower extremities and upper extremities.  Assessment: Severe pancreatitis, unclear etiology Shock, PEA cardiac arrest New onset diabetic ketoacidosis Right lower lobe pneumonia, possible aspiration in the setting of cardiac arrest. Hypernatremia Elevated triglycerides AKI  Plan: Requiring a significant amount of IV sedation. We will attempt to wean off/stop continuous Versed. Continue Precedex Would prefer boluses if at all possible. He is significantly fluid positive and we need to try to get volume off of him. He is making urine there has been some issues with Foley. Foley removed and condom catheter placed.  Continue to follow urine output with as needed bladder scans.  To ensure no retention issues.   Diuresis  today  Follow lytes with diuresis   This patient is critically ill with multiple organ system failure; which, requires frequent high complexity decision making, assessment, support, evaluation, and titration of therapies. This was completed through the application of advanced monitoring technologies and extensive interpretation of multiple databases. During this encounter critical care time was devoted to patient care services described in this note for 32 minutes.  Josephine Igo, DO [facility] 01/29/2020 10:57 AM

## 2020-01-29 NOTE — Progress Notes (Signed)
Left upper extremity venous duplex has been completed. Preliminary results can be found in CV Proc through chart review.   01/29/20 10:35 AM Olen Cordial RVT

## 2020-01-29 NOTE — Progress Notes (Signed)
eLink Physician-Brief Progress Note Patient Name: Bryan [name] DOB: 10/31/1975 MRN: [id number]   Date of Service  01/29/2020  HPI/Events of Note  Patient with sub-optimal glycemic control on the sensitive glycemic protocol.  eICU Interventions  Patient switched to the standard glycemic protocol.        Thomasene Lot Ragan Duhon 01/29/2020, 8:07 PM

## 2020-01-29 NOTE — Plan of Care (Signed)
  Problem: Education: Goal: Knowledge of General Education information will improve Description: Including pain rating scale, medication(s)/side effects and non-pharmacologic comfort measures Outcome: Progressing   Problem: Health Behavior/Discharge Planning: Goal: Ability to manage health-related needs will improve Outcome: Progressing   Problem: Clinical Measurements: Goal: Ability to maintain clinical measurements within normal limits will improve Outcome: Progressing Goal: Will remain free from infection Outcome: Progressing Goal: Diagnostic test results will improve Outcome: Progressing Goal: Respiratory complications will improve Outcome: Progressing Goal: Cardiovascular complication will be avoided Outcome: Progressing   Problem: Activity: Goal: Risk for activity intolerance will decrease Outcome: Progressing   Problem: Nutrition: Goal: Adequate nutrition will be maintained Outcome: Progressing   Problem: Coping: Goal: Level of anxiety will decrease Outcome: Progressing   Problem: Elimination: Goal: Will not experience complications related to bowel motility Outcome: Progressing Goal: Will not experience complications related to urinary retention Outcome: Progressing   Problem: Pain Managment: Goal: General experience of comfort will improve Outcome: Progressing   Problem: Safety: Goal: Ability to remain free from injury will improve Outcome: Progressing   Problem: Skin Integrity: Goal: Risk for impaired skin integrity will decrease Outcome: Progressing   Problem: Education: Goal: Ability to describe self-care measures that may prevent or decrease complications (Diabetes Survival Skills Education) will improve Outcome: Progressing Goal: Individualized Educational Video(s) Outcome: Progressing   Problem: Cardiac: Goal: Ability to maintain an adequate cardiac output will improve Outcome: Progressing   Problem: Health Behavior/Discharge  Planning: Goal: Ability to identify and utilize available resources and services will improve Outcome: Progressing Goal: Ability to manage health-related needs will improve Outcome: Progressing   Problem: Fluid Volume: Goal: Ability to achieve a balanced intake and output will improve Outcome: Progressing   Problem: Metabolic: Goal: Ability to maintain appropriate glucose levels will improve Outcome: Progressing   Problem: Nutritional: Goal: Maintenance of adequate nutrition will improve Outcome: Progressing Goal: Maintenance of adequate weight for body size and type will improve Outcome: Progressing   Problem: Respiratory: Goal: Will regain and/or maintain adequate ventilation Outcome: Progressing   Problem: Urinary Elimination: Goal: Ability to achieve and maintain adequate renal perfusion and functioning will improve Outcome: Progressing   Problem: Activity: Goal: Ability to tolerate increased activity will improve Outcome: Progressing   Problem: Respiratory: Goal: Ability to maintain a clear airway and adequate ventilation will improve Outcome: Progressing   Problem: Role Relationship: Goal: Method of communication will improve Outcome: Progressing

## 2020-01-30 ENCOUNTER — Inpatient Hospital Stay (HOSPITAL_COMMUNITY): Payer: Medicaid Other

## 2020-01-30 DIAGNOSIS — K858 Other acute pancreatitis without necrosis or infection: Secondary | ICD-10-CM | POA: Diagnosis not present

## 2020-01-30 DIAGNOSIS — J9601 Acute respiratory failure with hypoxia: Secondary | ICD-10-CM | POA: Diagnosis not present

## 2020-01-30 DIAGNOSIS — E0811 Diabetes mellitus due to underlying condition with ketoacidosis with coma: Secondary | ICD-10-CM | POA: Diagnosis not present

## 2020-01-30 LAB — COMPREHENSIVE METABOLIC PANEL
ALT: 70 U/L — ABNORMAL HIGH (ref 0–44)
AST: 94 U/L — ABNORMAL HIGH (ref 15–41)
Albumin: 1.8 g/dL — ABNORMAL LOW (ref 3.5–5.0)
Alkaline Phosphatase: 201 U/L — ABNORMAL HIGH (ref 38–126)
Anion gap: 12 (ref 5–15)
BUN: 45 mg/dL — ABNORMAL HIGH (ref 6–20)
CO2: 26 mmol/L (ref 22–32)
Calcium: 9.4 mg/dL (ref 8.9–10.3)
Chloride: 110 mmol/L (ref 98–111)
Creatinine, Ser: 1.28 mg/dL — ABNORMAL HIGH (ref 0.61–1.24)
GFR, Estimated: >60 mL/min (ref >60)
Glucose, Bld: 198 mg/dL — ABNORMAL HIGH (ref 70–99)
Potassium: 3.5 mmol/L (ref 3.5–5.1)
Sodium: 148 mmol/L — ABNORMAL HIGH (ref 135–145)
Total Bilirubin: 0.9 mg/dL (ref 0.3–1.2)
Total Protein: 6.2 g/dL — ABNORMAL LOW (ref 6.5–8.1)

## 2020-01-30 LAB — CBC
HCT: 32.2 % — ABNORMAL LOW (ref 39.0–52.0)
Hemoglobin: 10.9 g/dL — ABNORMAL LOW (ref 13.0–17.0)
MCH: 31.4 pg (ref 26.0–34.0)
MCHC: 33.9 g/dL (ref 30.0–36.0)
MCV: 92.8 fL (ref 80.0–100.0)
Platelets: 155 10*3/uL (ref 150–400)
RBC: 3.47 MIL/uL — ABNORMAL LOW (ref 4.22–5.81)
RDW: 15.2 % (ref 11.5–15.5)
WBC: 15.9 10*3/uL — ABNORMAL HIGH (ref 4.0–10.5)
nRBC: 0.7 % — ABNORMAL HIGH (ref 0.0–0.2)

## 2020-01-30 LAB — BASIC METABOLIC PANEL
Anion gap: 14 (ref 5–15)
BUN: 47 mg/dL — ABNORMAL HIGH (ref 6–20)
CO2: 25 mmol/L (ref 22–32)
Calcium: 9.1 mg/dL (ref 8.9–10.3)
Chloride: 109 mmol/L (ref 98–111)
Creatinine, Ser: 1.35 mg/dL — ABNORMAL HIGH (ref 0.61–1.24)
GFR, Estimated: >60 mL/min (ref >60)
Glucose, Bld: 242 mg/dL — ABNORMAL HIGH (ref 70–99)
Potassium: 3.1 mmol/L — ABNORMAL LOW (ref 3.5–5.1)
Sodium: 148 mmol/L — ABNORMAL HIGH (ref 135–145)

## 2020-01-30 LAB — AMMONIA: Ammonia: 27 umol/L (ref 9–35)

## 2020-01-30 LAB — GLUCOSE, CAPILLARY
Glucose-Capillary: 193 mg/dL — ABNORMAL HIGH (ref 70–99)
Glucose-Capillary: 202 mg/dL — ABNORMAL HIGH (ref 70–99)
Glucose-Capillary: 199 mg/dL — ABNORMAL HIGH (ref 70–99)
Glucose-Capillary: 181 mg/dL — ABNORMAL HIGH (ref 70–99)
Glucose-Capillary: 143 mg/dL — ABNORMAL HIGH (ref 70–99)
Glucose-Capillary: 203 mg/dL — ABNORMAL HIGH (ref 70–99)

## 2020-01-30 LAB — PHOSPHORUS: Phosphorus: 2.5 mg/dL (ref 2.5–4.6)

## 2020-01-30 LAB — MAGNESIUM
Magnesium: 2.1 mg/dL (ref 1.7–2.4)
Magnesium: 2.1 mg/dL (ref 1.7–2.4)

## 2020-01-30 LAB — POTASSIUM: Potassium: 3.4 mmol/L — ABNORMAL LOW (ref 3.5–5.1)

## 2020-01-30 LAB — TRIGLYCERIDES: Triglycerides: 497 mg/dL — ABNORMAL HIGH (ref <150)

## 2020-01-30 MED ORDER — FUROSEMIDE 10 MG/ML IJ SOLN
40.0000 mg | Freq: Four times a day (QID) | INTRAMUSCULAR | Status: AC
  Administered 2020-01-30 (×3): 40 mg via INTRAVENOUS
  Filled 2020-01-30 (×3): qty 4

## 2020-01-30 MED ORDER — PANTOPRAZOLE SODIUM 40 MG PO PACK
40.0000 mg | PACK | Freq: Every day | ORAL | Status: DC
  Administered 2020-01-30 – 2020-02-07 (×9): 40 mg
  Filled 2020-01-30 (×9): qty 20

## 2020-01-30 MED ORDER — INSULIN ASPART 100 UNIT/ML ~~LOC~~ SOLN
10.0000 [IU] | SUBCUTANEOUS | Status: DC
  Administered 2020-01-30 – 2020-01-31 (×6): 10 [IU] via SUBCUTANEOUS

## 2020-01-30 MED ORDER — SODIUM CHLORIDE 0.9% FLUSH
10.0000 mL | Freq: Two times a day (BID) | INTRAVENOUS | Status: DC
  Administered 2020-01-30 – 2020-02-01 (×5): 10 mL
  Administered 2020-02-01: 30 mL
  Administered 2020-02-02 – 2020-02-04 (×5): 10 mL

## 2020-01-30 MED ORDER — FREE WATER
200.0000 mL | Status: DC
  Administered 2020-01-30 – 2020-02-03 (×23): 200 mL

## 2020-01-30 MED ORDER — MIDAZOLAM HCL 2 MG/2ML IJ SOLN: 1.0000 mg | INTRAMUSCULAR | Status: DC | PRN

## 2020-01-30 MED ORDER — MIDAZOLAM HCL 2 MG/2ML IJ SOLN
1.0000 mg | INTRAMUSCULAR | Status: DC | PRN
  Administered 2020-02-01: 1 mg via INTRAVENOUS
  Filled 2020-01-30: qty 2

## 2020-01-30 MED ORDER — INSULIN DETEMIR 100 UNIT/ML ~~LOC~~ SOLN
30.0000 [IU] | Freq: Two times a day (BID) | SUBCUTANEOUS | Status: DC
  Administered 2020-01-30: 30 [IU] via SUBCUTANEOUS
  Filled 2020-01-30 (×3): qty 0.3

## 2020-01-30 MED ORDER — SODIUM CHLORIDE 0.9% FLUSH: 10.0000 mL | INTRAVENOUS | Status: DC | PRN

## 2020-01-30 MED ORDER — ACETAMINOPHEN 325 MG PO TABS
650.0000 mg | ORAL_TABLET | Freq: Four times a day (QID) | ORAL | Status: DC | PRN
  Administered 2020-01-30 – 2020-03-02 (×27): 650 mg
  Filled 2020-01-30 (×29): qty 2

## 2020-01-30 MED ORDER — METOLAZONE 2.5 MG PO TABS
5.0000 mg | ORAL_TABLET | Freq: Once | ORAL | Status: AC
  Administered 2020-01-30: 5 mg via ORAL
  Filled 2020-01-30: qty 2

## 2020-01-30 MED ORDER — POTASSIUM CHLORIDE 20 MEQ PO PACK
40.0000 meq | PACK | Freq: Once | ORAL | Status: AC
  Administered 2020-01-30: 40 meq
  Filled 2020-01-30: qty 2

## 2020-01-30 NOTE — Progress Notes (Signed)
EEG complete - results pending 

## 2020-01-30 NOTE — Progress Notes (Addendum)
NAME:  Bryan [name], MRN:  [id number], DOB:  1975-06-10, LOS: 5 ADMISSION DATE:  01/25/2020, CONSULTATION DATE:  01/25/20 REFERRING MD:  Claudette LawsWatson CHIEF COMPLAINT:  AMS    Brief History   Bryan [name] is a 44 y.o. male admitted w/ DKA on 11/29.  PCCM consulted 11/30 for agitation and borderline hypotension. Patient later had PEA cardiac arrest, 2 rounds of CPR when ROSC achieved, patient intubated. Transferred to ICU, CVL and arterial line placed.   CT Head is unremarkable except for right sinus disease. CT Chest/Abdomen/Pelvis showed evidence of moderate acute pancreatitis, mild ascites and small bilateral pleural effusions. Lipase of 315U/L on 01/26/20. No initial clear etiology for pancreatitis, mother denies patient using alcohol, CT a/p negative for dilated biliary ducts or gall stones. Triglycerides found to be 600.    Past Medical History   Past Medical History:  Diagnosis Date  . Hypertension   . Psychiatric problem    Seen at [facility] for unknown reason. Takes seroquel. History of hearing voices.  Not commandivng voices.   . Schizophrenia (HCC)   . Tremors of nervous system     Significant Hospital Events   11/29 - admitted 11/30 - PEA Arrest and transfer to ICU  Consults:  PCCM  Procedures:  11/30 ETT 11/30 A line, removed 01/28/20 11/30 Left IJ CVL 11/30 NG tube placed  Significant Diagnostic Tests:  CXR 11/29 - neg.  CT Head 12/1 - right sinus disease, no acute intracranial pathology CT Chest/Abd/Pel 12/1 -moderate pancreatis, mild asicites. Small bilateral pleural effusions Abd x-ray 12/1 - NG tube tip and side port in proximal stomach. No bowel obstruction or free air CXR 12/1 - No pneumothorax, developing pneumonia left lower lobe. Mild bibasilar atelectasis. RUQ US 12/1 - GB sludge, negative acute cholecystitis. Fatty liver. Small ascites/partially visualized right pleural effusion  TTE Echo 12/1 - Gr I diastolic dysfunction LE US 12/1 - No evidence  DVT in either LE  CXR 01/29/20 - Lines and tubes in stable position. New infiltrate right lung base. Persistent bibasilar atelectasis.   LUE US 01/29/20 - No evidence of DVT  Micro Data:  11/30 Bld Cx - no growth 72 hrs 11/30 MRSA - negative  Antimicrobials:  01/26/2020 cefepime - Day 5/7 01/26/2020 - d/c'd 12/1 vancomycin  Interim history/subjective:  Asynchronous with vent Switched to standard glycemic control yesterday evening due to suboptimal glycemic control Currently on fentanyl, precedex and vasopressin Eyes open  Objective   Blood pressure 115/61, pulse 88, temperature 100 F (37.8 C), temperature source Core, resp. rate (!) 28, height 5\' 10"  (1.778 m), weight 90.7 kg, SpO2 99 %. CVP:  [9 mmHg-13 mmHg] 12 mmHg  Vent Mode: PRVC FiO2 (%):  [60 %-80 %] 60 % Set Rate:  [28 bmp] 28 bmp Vt Set:  [510 mL] 510 mL PEEP:  [5 cmH20] 5 cmH20 Plateau Pressure:  [16 cmH20-18 cmH20] 18 cmH20   Intake/Output Summary (Last 24 hours) at 01/30/2020 0640 Last data filed at 01/30/2020 0600 Gross per 24 hour  Intake 2894.39 ml  Output 5810 ml  Net -2915.61 ml   Filed Weights   01/25/20 1257  Weight: 90.7 kg    Examination: General: Sedated, desynchronous with vent. HENT: mucous membranes dry. Pupils pinpoint, eyes open. Negative corneal reflex.  Lungs:  Bilateral mechanically ventilated breath sounds Cardiovascular: Tachy, regular rhythm. no murmurs, gallops, rubs. Abdomen: Distended, muscle contraction. No bowel sounds appreciated. Patient flinches with deep palpation  Extremities: diffuse extremity swelling Neuro: Sedated, responding to painful stimuli  Skin: Lower extremities cool. No rash or lesions. Left CVL clean dry intact GU: foley catheter in place. Amber urine in bag.   Assessment & Plan:  Shock PEA Cardiac Arrest Suspected etiology is severe pancreatitis. Do not suspect infectious etiology or PE at this time. - Lower extremity dopplers are negative for DVT, TTE  negative for clots - TTE 12/1 negative for cardiac etiology - Covering with cefepime. Vancomycin discontinued, MRSA screen negative - Continue to wean off of pressors  Severe Pancreatitis Unknown etiology at this time. There is question about patient's alcohol use, patient's mother states patient does not drink "heavy stuff," only beers. She has noticed him drinking once a week, but she lives with patient on/off. On chart review only quantified alcohol amount is one 40 oz beer weekly. Triglycerides are 600 on initial check. Patient not on medications that could have this side effect. No dilation of biliary tract or concern for stones on CT abdomen or RUQUS. Low concern for abdominal compartment syndrome, abdomen soft, no worsening of distension from yesterday. Patient's sister with Hx of pancreatitis, mother unsure of etiology.  - RUQUS no ductal dilation, no evidence of acute cholecystitis   - Tg increased, suspect due to dc insulin drip - Ranson score of 1, 1% predicted mortality  - Na continues to improve - Consider GI consult if patient acutely worsens - Nutrition following for tube feeds  Diabetic Ketoacidosis New onset in setting of pancreatititis - Transitioned off of insulin drip yesterday to sensitive glycemic protocol. Switched to standard glycemic protocol overnight - Continue glucose checks  Acute Hypoxemic Respiratory Failure In setting of cardiac arrest, shock and pancreatitis - Changed to pressure support ventilation  - Patient weaned off of versed yesterday. Still requiring precedex, fentanyl and vasopressin. Plan to half fentanyl today - Patient responding to pain, but not commands. Suspect lack of responsiveness due to prior sedation, volume overload state. Uncertain of patient's new baseline at this time.  - VAP prevention protocol ordered  New Right Lower Opacity New right lower opacity seen on x-ray 12/3  - Continue cefepime Start date 11/30, end date 12/6, 7 day  course  - If not improving, will consider additional antibiotic coverage  Acute Kidney Injury Hypernatremia Hypokalemia In setting of shock. Hypernatremia in setting of aggressive fluid resuscitation. Hypokalemia in setting of diuretic use yesterday  - Hypernatremia improving - K of 3.1 this am. Klor-con 40 meq packet BID, order additional packet now - Cr improving - No acute issues noted on CT abdomen  Volume Overload Status Patient has been net positive I/O since admission.  - Diffuse extremity swelling, suspect secondary to volume overload state. Improved since yesterday - Foley catheter replaced yesterday, improved urinary output - Continue bladder scans as needed - 40 mg lasix q6h 3x. 5mg  metolazone once.  - Repeat afternoon CMP  Encephalopathy In setting of shock and pancreatitis - Continue sedation as above  Best practice:  Diet: NPO. Trickle Tube Feeds Pain/Anxiety/Delirium protocol (if indicated): Fentanyl, precedex. Wean today VAP protocol (if indicated): Ordered DVT prophylaxis: SCD's / Lovenox. GI prophylaxis: PPI Glucose control: Standard glycemic control Mobility: Bedrest. Last date of multidisciplinary goals of care discussion: 01/27/20 at bedside with nurse and Dr. 14/1/21 Family and staff present: Nurse and doctor Summary of discussion: Patient is full code. Continue aggressive care measures. Follow up goals of care discussion due: 02/02/20 Code Status: Full Family Communication: Mother at bedside yesterday. Updated of plan to continue to wean pressors/sedatives Disposition: ICU  Labs  CBC: Recent Labs  Lab 01/25/20 1235 01/25/20 1600 01/26/20 1637 01/26/20 2111 01/27/20 1049 01/27/20 1054 01/28/20 0258 01/28/20 0925 01/28/20 2354 01/29/20 0253 01/30/20 0352  WBC 16.7*   < > 16.2*  --  17.9*  --  16.7*  --   --  17.2* 15.9*  NEUTROABS 13.6*  --   --   --   --   --   --   --   --   --   --   HGB 16.3   < > 14.0   < > 14.8   < > 13.3 11.9* 12.9*  10.9* 10.9*  HCT 50.1   < > 39.8   < > 43.7   < > 38.1* 35.0* 38.0* 34.0* 32.2*  MCV 94.0   < > 89.8  --  89.9  --  90.5  --   --  94.2 92.8  PLT 321   < > 149*  --  148*  --  126*  --   --  127* 155   < > = values in this interval not displayed.    Basic Metabolic Panel: Recent Labs  Lab 01/27/20 0834 01/27/20 1054 01/27/20 2320 01/27/20 2320 01/28/20 0258 01/28/20 0258 01/28/20 0925 01/28/20 1435 01/28/20 1714 01/28/20 2354 01/29/20 0253 01/29/20 1706 01/30/20 0352  NA 151*   < > 150*   < > 153*   < > 153* 149*  --  151* 147*  --  148*  K 3.8   < > 4.2   < > 3.6   < > 3.5 3.4*  --  3.8 3.7  --  3.1*  CL 113*   < > 113*  --  116*  --   --  120*  --   --  111  --  109  CO2 22   < > 25  --  24  --   --  23  --   --  24  --  25  GLUCOSE 128*   < > 181*  --  184*  --   --  171*  --   --  189*  --  242*  BUN 77*   < > 72*  --  65*  --   --  51*  --   --  49*  --  47*  CREATININE 2.29*   < > 1.92*  --  1.81*  --   --  1.48*  --   --  1.48*  --  1.35*  CALCIUM 9.3   < > 8.4*  --  8.7*  --   --  7.2*  --   --  8.4*  --  9.1  MG 2.7*  --   --   --   --   --   --   --  2.2  --  2.4 2.3 2.1  PHOS 2.5  --   --   --   --   --   --   --  1.6*  --  1.9* 2.3* 2.5   < > = values in this interval not displayed.   GFR: Estimated Creatinine Clearance: 79.9 mL/min (A) (by C-G formula based on SCr of 1.35 mg/dL (H)). Recent Labs  Lab 01/26/20 0104 01/26/20 0345 01/26/20 1615 01/26/20 1637 01/26/20 1637 01/26/20 2035 01/26/20 2341 01/27/20 1049 01/28/20 0258 01/29/20 0253 01/30/20 0352  PROCALCITON 5.93  --   --   --   --   --   --   --   --   --   --  WBC  --    < >  --  16.2*   < >  --   --  17.9* 16.7* 17.2* 15.9*  LATICACIDVEN 4.1*   < > 6.8* 3.7*  --  2.4* 2.9*  --   --   --   --    < > = values in this interval not displayed.    Liver Function Tests: Recent Labs  Lab 01/26/20 1637 01/27/20 1416 01/27/20 2320 01/28/20 0258 01/28/20 1435  AST 152* 82* 73* 58* 47*  ALT  50* 42 41 38 34  ALKPHOS 63 75 80 77 75  BILITOT 1.1 1.1 1.6* 1.0 1.0  PROT 4.2* 5.0* 4.9* 5.0* 4.6*  ALBUMIN 2.2* 2.0* 1.9* 1.8* 1.6*   Recent Labs  Lab 01/26/20 1615 01/29/20 0253  LIPASE 315* 24   No results for input(s): AMMONIA in the last 168 hours.  ABG    Component Value Date/Time   PHART 7.448 01/28/2020 2354   PCO2ART 36.7 01/28/2020 2354   PO2ART 76 (L) 01/28/2020 2354   HCO3 25.4 01/28/2020 2354   TCO2 26 01/28/2020 2354   ACIDBASEDEF 5.0 (H) 01/26/2020 2111   O2SAT 96.0 01/28/2020 2354     Coagulation Profile: No results for input(s): INR, PROTIME in the last 168 hours.  Cardiac Enzymes: No results for input(s): CKTOTAL, CKMB, CKMBINDEX, TROPONINI in the last 168 hours.  HbA1C: Hgb A1c MFr Bld  Date/Time Value Ref Range Status  01/25/2020 05:40 PM 13.4 (H) 4.8 - 5.6 % Final    Comment:    (NOTE) Pre diabetes:          5.7%-6.4%  Diabetes:              >6.4%  Glycemic control for   <7.0% adults with diabetes     CBG: Recent Labs  Lab 01/29/20 1256 01/29/20 1505 01/29/20 1931 01/29/20 2307 01/30/20 0326  GLUCAP 165* 170* 268* 272* 193*    Vasili Katsadouros Internal Medicine Resident PGY-1 [facility]   Pulmonary critical care attending:  Patient seen in conjunction with medical resident documentation as above.  This is a 44 year old admitted for DKA, pancreatitis status post PEA cardiac arrest 2 rounds of CPR with ROSC.  Patient was fluid resuscitated.  Had good urine output with diuresis yesterday.  Tolerating decrease in his sedation needs.  Remains on fentanyl Precedex and as needed Versed.  On vasopressin.  Was able to wean off of norepinephrine.  BP (!) 132/114 (BP Location: Right Arm)   Pulse (!) 125   Temp (!) 101 F (38.3 C) (Core)   Resp (!) 22   Ht 5\' 10"  (1.778 m)   Wt 90.7 kg   SpO2 98%   BMI 28.70 kg/m   General: Young male intubated mechanical life support critically ill HEENT: Endotracheal tube in  place Heart: Tachycardic, regular S1-S2 Lungs: Bilateral mechanically ventilated breath sounds Abdomen: Distended Extremities: Dependent edema  Labs: Reviewed Hypokalemia  Urine output I's and O's reviewed  Assessment: Severe pancreatitis, unclear etiology Shock, PEA cardiac arrest Diabetic ketoacidosis, new onset, present on admission Right lower lobe pneumonia, possible aspiration sudden cardiac arrest Hyponatremia Hypokalemia with diuresis Positive cumulative fluid balance AKI  Plan: Continue to keep off as much continuous sedation as possible. Mental status has yet to improve significantly. EEG to rule out subclinical status. May need MRI if encephalopathy doesn't improve Continue bolus sedation as needed. Diuresis to help with positive cumulative fluid balance. Follow I's and O's Repeat electrolytes Foley was replaced  yesterday. Wean vasopressors to maintain mean arterial pressure greater than 65 mmHg.  This patient is critically ill with multiple organ system failure; which, requires frequent high complexity decision making, assessment, support, evaluation, and titration of therapies. This was completed through the application of advanced monitoring technologies and extensive interpretation of multiple databases. During this encounter critical care time was devoted to patient care services described in this note for 34 minutes.  Josephine Igo, DO [facility] 01/30/2020 3:50 PM

## 2020-01-30 NOTE — Procedures (Signed)
Patient Name: Bryan [name]  MRN: [id number]  Epilepsy Attending: Charlsie Quest  Referring Physician/Provider: Dr Belva Agee Date: 01/30/2020 Duration: 33.13 mins  Patient history: 44yo M s/p cardiac arrest. EEG to evaluate for seizure  Level of alertness: Awake  AEDs during EEG study: None  Technical aspects: This EEG study was done with scalp electrodes positioned according to the 10-20 International system of electrode placement. Electrical activity was acquired at a sampling rate of 500Hz  and reviewed with a high frequency filter of 70Hz  and a low frequency filter of 1Hz . EEG data were recorded continuously and digitally stored.   Description: EEG showed continuous generalized 3 to 6 Hz theta-delta slowing. Hyperventilation and photic stimulation were not performed.     Of note, EEG was technically difficult due to significant myogenic artifact  ABNORMALITY -Continuous slow, generalized  IMPRESSION: This technically difficult study is suggestive of severe diffuse encephalopathy, nonspecific etiology. No seizures or epileptiform discharges were seen throughout the recording.  If suspicion for ictal/interictal activity remains a concern, a repeat study can be considered.   Bryan [name] 

## 2020-01-30 NOTE — Progress Notes (Signed)
eLink Physician-Brief Progress Note Patient Name: Bryan [name] DOB: 02/02/1976 MRN: [id number]   Date of Service  01/30/2020  HPI/Events of Note  Asking for flexiseal for multiple loose bowel. Meds, labs reviewed.  eICU Interventions  - Flexiseal ordered. Hold colace. K was low earlier. Get K/Mag level, call back if lower for replacement.      Intervention Category Intermediate Interventions: Other:  Ranee Gosselin 01/30/2020, 7:45 PM

## 2020-01-31 ENCOUNTER — Inpatient Hospital Stay (HOSPITAL_COMMUNITY): Payer: Medicaid Other

## 2020-01-31 ENCOUNTER — Encounter (HOSPITAL_COMMUNITY): Payer: Self-pay | Admitting: Radiology

## 2020-01-31 DIAGNOSIS — K859 Acute pancreatitis without necrosis or infection, unspecified: Secondary | ICD-10-CM | POA: Diagnosis not present

## 2020-01-31 DIAGNOSIS — E0811 Diabetes mellitus due to underlying condition with ketoacidosis with coma: Secondary | ICD-10-CM | POA: Diagnosis not present

## 2020-01-31 LAB — CBC WITH DIFFERENTIAL/PLATELET
Abs Immature Granulocytes: 2.12 10*3/uL — ABNORMAL HIGH (ref 0.00–0.07)
Basophils Absolute: 0.1 10*3/uL (ref 0.0–0.1)
Basophils Relative: 1 %
Eosinophils Absolute: 0 10*3/uL (ref 0.0–0.5)
Eosinophils Relative: 0 %
HCT: 35.8 % — ABNORMAL LOW (ref 39.0–52.0)
Hemoglobin: 11.6 g/dL — ABNORMAL LOW (ref 13.0–17.0)
Immature Granulocytes: 11 %
Lymphocytes Relative: 8 %
Lymphs Abs: 1.5 10*3/uL (ref 0.7–4.0)
MCH: 30.1 pg (ref 26.0–34.0)
MCHC: 32.4 g/dL (ref 30.0–36.0)
MCV: 93 fL (ref 80.0–100.0)
Monocytes Absolute: 2 10*3/uL — ABNORMAL HIGH (ref 0.1–1.0)
Monocytes Relative: 10 %
Neutro Abs: 13.6 10*3/uL — ABNORMAL HIGH (ref 1.7–7.7)
Neutrophils Relative %: 70 %
Platelets: 190 10*3/uL (ref 150–400)
RBC: 3.85 MIL/uL — ABNORMAL LOW (ref 4.22–5.81)
RDW: 15.3 % (ref 11.5–15.5)
WBC: 19.9 10*3/uL — ABNORMAL HIGH (ref 4.0–10.5)
nRBC: 0.7 % — ABNORMAL HIGH (ref 0.0–0.2)

## 2020-01-31 LAB — GLUCOSE, CAPILLARY
Glucose-Capillary: 118 mg/dL — ABNORMAL HIGH (ref 70–99)
Glucose-Capillary: 121 mg/dL — ABNORMAL HIGH (ref 70–99)
Glucose-Capillary: 144 mg/dL — ABNORMAL HIGH (ref 70–99)
Glucose-Capillary: 178 mg/dL — ABNORMAL HIGH (ref 70–99)
Glucose-Capillary: 220 mg/dL — ABNORMAL HIGH (ref 70–99)
Glucose-Capillary: 240 mg/dL — ABNORMAL HIGH (ref 70–99)

## 2020-01-31 LAB — COMPREHENSIVE METABOLIC PANEL
ALT: 76 U/L — ABNORMAL HIGH (ref 0–44)
AST: 88 U/L — ABNORMAL HIGH (ref 15–41)
Albumin: 1.9 g/dL — ABNORMAL LOW (ref 3.5–5.0)
Alkaline Phosphatase: 252 U/L — ABNORMAL HIGH (ref 38–126)
Anion gap: 15 (ref 5–15)
BUN: 54 mg/dL — ABNORMAL HIGH (ref 6–20)
CO2: 26 mmol/L (ref 22–32)
Calcium: 9.4 mg/dL (ref 8.9–10.3)
Chloride: 105 mmol/L (ref 98–111)
Creatinine, Ser: 1.45 mg/dL — ABNORMAL HIGH (ref 0.61–1.24)
GFR, Estimated: >60 mL/min (ref >60)
Glucose, Bld: 240 mg/dL — ABNORMAL HIGH (ref 70–99)
Potassium: 3.1 mmol/L — ABNORMAL LOW (ref 3.5–5.1)
Sodium: 146 mmol/L — ABNORMAL HIGH (ref 135–145)
Total Bilirubin: 0.8 mg/dL (ref 0.3–1.2)
Total Protein: 6.4 g/dL — ABNORMAL LOW (ref 6.5–8.1)

## 2020-01-31 LAB — CULTURE, BLOOD (ROUTINE X 2)
Culture: NO GROWTH
Culture: NO GROWTH
Special Requests: ADEQUATE

## 2020-01-31 LAB — MAGNESIUM: Magnesium: 2.1 mg/dL (ref 1.7–2.4)

## 2020-01-31 LAB — PHOSPHORUS: Phosphorus: 3.3 mg/dL (ref 2.5–4.6)

## 2020-01-31 MED ORDER — INSULIN DETEMIR 100 UNIT/ML ~~LOC~~ SOLN
35.0000 [IU] | Freq: Two times a day (BID) | SUBCUTANEOUS | Status: DC
  Administered 2020-01-31 – 2020-02-01 (×4): 35 [IU] via SUBCUTANEOUS
  Filled 2020-01-31 (×6): qty 0.35

## 2020-01-31 MED ORDER — POTASSIUM CHLORIDE 10 MEQ/100ML IV SOLN
10.0000 meq | INTRAVENOUS | Status: AC
  Administered 2020-01-31 (×4): 10 meq via INTRAVENOUS
  Filled 2020-01-31 (×4): qty 100

## 2020-01-31 MED ORDER — GADOBUTROL 1 MMOL/ML IV SOLN
10.0000 mL | Freq: Once | INTRAVENOUS | Status: AC | PRN
  Administered 2020-01-31: 10 mL via INTRAVENOUS

## 2020-01-31 MED ORDER — INSULIN ASPART 100 UNIT/ML ~~LOC~~ SOLN
12.0000 [IU] | SUBCUTANEOUS | Status: DC
  Administered 2020-01-31 – 2020-02-02 (×10): 12 [IU] via SUBCUTANEOUS

## 2020-01-31 MED ORDER — FUROSEMIDE 10 MG/ML IJ SOLN
40.0000 mg | Freq: Four times a day (QID) | INTRAMUSCULAR | Status: AC
  Administered 2020-01-31 (×3): 40 mg via INTRAVENOUS
  Filled 2020-01-31 (×2): qty 4

## 2020-01-31 MED ORDER — METOLAZONE 2.5 MG PO TABS
5.0000 mg | ORAL_TABLET | Freq: Once | ORAL | Status: AC
  Administered 2020-01-31: 5 mg via ORAL
  Filled 2020-01-31: qty 2

## 2020-01-31 NOTE — Progress Notes (Addendum)
eLink Physician-Brief Progress Note Patient Name: Bryan [name] DOB: [birth year] MRN: [id number]   Date of Service  01/31/2020  HPI/Events of Note  K still low at 3.1, Cr 1.4. earlier had via OG tube.   eICU Interventions  Kcl 10 meq IVPB, every hour via PIV or  CVL x 4 doses.  Bed side is aware.      Intervention Category Intermediate Interventions: Electrolyte abnormality - evaluation and management  Ranee Gosselin 01/31/2020, 5:58 AM   6:22 To run Kcl via CVL, same as above instead of PIV- malfunctioning. Discussed with RN.

## 2020-01-31 NOTE — Progress Notes (Signed)
eLink Physician-Brief Progress Note Patient Name: Bryan [name] DOB: 01-May-1975 MRN: [id number]   Date of Service  01/31/2020  HPI/Events of Note  Inquiry about insulin coverage. Patient on Levemir 35 BID which was just increased this morning from 30 BID. Also on regular insulin 12 q 4 which was also increased today from 10. CBG this evening 118  eICU Interventions  CBG 200s throughout the day. Tolerating TF at 60 cc/hr unchanged rate Continue with current basal insulin dose and reassess at midnight if the q 4 coverage may be decreased or discontinued     Intervention Category Major Interventions: Hyperglycemia - active titration of insulin therapy  Darl Pikes 01/31/2020, 10:00 PM

## 2020-01-31 NOTE — Progress Notes (Signed)
NAME:  Bryan [name], MRN:  [id number], DOB:  11-Dec-1975, LOS: 6 ADMISSION DATE:  01/25/2020, CONSULTATION DATE:  01/25/20 REFERRING MD:  Claudette Laws CHIEF COMPLAINT:  AMS    Brief History   Guillermo Nehring is a 44 y.o. male admitted w/ DKA on 11/29.  PCCM consulted 11/30 for agitation and borderline hypotension. Patient later had PEA cardiac arrest, 2 rounds of CPR when ROSC achieved, patient intubated. Transferred to ICU, CVL and arterial line placed.   CT Head is unremarkable except for right sinus disease. CT Chest/Abdomen/Pelvis showed evidence of moderate acute pancreatitis, mild ascites and small bilateral pleural effusions. Lipase of 315U/L on 01/26/20. No initial clear etiology for pancreatitis, mother denies patient using alcohol, CT a/p negative for dilated biliary ducts or gall stones. Triglycerides found to be 600.    Past Medical History   Past Medical History:  Diagnosis Date  . Hypertension   . Psychiatric problem    Seen at [place] for unknown reason. Takes seroquel. History of hearing voices.  Not commandivng voices.   . Schizophrenia (HCC)   . Tremors of nervous system     Significant Hospital Events   11/29 - admitted 11/30 - PEA Arrest and transfer to ICU  Consults:  PCCM  Procedures:  11/30 ETT 11/30 A line, removed 01/28/20 11/30 Left IJ CVL 11/30 NG tube placed  Significant Diagnostic Tests:  CXR 11/29 - neg.  CT Head 12/1 - right sinus disease, no acute intracranial pathology CT Chest/Abd/Pel 12/1 -moderate pancreatis, mild asicites. Small bilateral pleural effusions Abd x-ray 12/1 - NG tube tip and side port in proximal stomach. No bowel obstruction or free air CXR 12/1 - No pneumothorax, developing pneumonia left lower lobe. Mild bibasilar atelectasis. RUQ [place] 12/1 - GB sludge, negative acute cholecystitis. Fatty liver. Small ascites/partially visualized right pleural effusion  TTE Echo 12/1 - Gr I diastolic dysfunction LE [place] 12/1 - No evidence  DVT in either LE  CXR 01/29/20 - Lines and tubes in stable position. New infiltrate right lung base. Persistent bibasilar atelectasis.   LUE [place] 01/29/20 - No evidence of DVT  Micro Data:  11/30 Bld Cx - no growth 72 hrs 11/30 MRSA - negative  Antimicrobials:  01/26/2020 cefepime - Day 5/7 01/26/2020 - d/c'd 12/1 vancomycin  Interim history/subjective:   Patient is critically ill intubated on mechanical life support.  Febrile overnight.  Objective   Blood pressure 130/90, pulse (!) 102, temperature (!) 101.4 F (38.6 C), temperature source Core, resp. rate 18, height 5\' 10"  (1.778 m), weight 108.3 kg, SpO2 100 %. CVP:  [11 mmHg-14 mmHg] 13 mmHg  Vent Mode: PRVC FiO2 (%):  [40 %-50 %] 40 % Set Rate:  [18 bmp-28 bmp] 18 bmp Vt Set:  [510 mL] 510 mL PEEP:  [5 cmH20] 5 cmH20 Plateau Pressure:  [14 cmH20-23 cmH20] 23 cmH20   Intake/Output Summary (Last 24 hours) at 01/31/2020 0701 Last data filed at 01/31/2020 0600 Gross per 24 hour  Intake 3132 ml  Output 7325 ml  Net -4193 ml   Filed Weights   01/25/20 1257 01/31/20 0318  Weight: 90.7 kg 108.3 kg    Examination: General: Young male intubated on mechanical life support HENT: Mucous membranes dry, pinpoint pupils, somewhat disconjugate gaze at times.  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 Do not suspect infectious etiology or PE at this time. Plan: Lower extremity Dopplers were negative TTE with normal ejection fraction Continue supportive care.  Postarrest. 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  Severe Pancreatitis - RUQUS no ductal dilation, no evidence of acute cholecystitis   Plan: Continue supportive care Tube feeds continued If patient remains febrile may need to consider CT imaging of the abdomen to evaluate for pseudocyst formation however this seems early on in the course.  Diabetic Ketoacidosis, likely secondary to pancreatic inflammation New onset in setting of pancreatititis Plan: Titrate SSI for goal CBG 140-180.  Acute Hypoxemic Respiratory Failure In setting of cardiac arrest, shock and pancreatitis At this point seems to be volume overloaded. I believe this is preventing liberation from the ventilator along with mental status. Plan: Continue diuresis.  New Right Lower Opacity, bilateral infiltrates Likely aspiration pneumonia. Plan: 7 days stop date cefepime.  Acute Kidney Injury Hypernatremia Hypokalemia Plan: Follow urine output, electrolytes Plan replace as needed. Goal potassium greater than 4, magnesium greater than 2. Continue to follow urine output.  Volume Overload Status Patient has been net positive I/O since admission.  Patient still with positive cumulative fluid balance. Plan: Repeat 40 mg Lasix every 6 hours +5 mg metolazone.   Best practice:  Diet: NPO. Trickle Tube Feeds Pain/Anxiety/Delirium protocol (if indicated): Fentanyl, precedex. Wean today VAP protocol (if indicated): Ordered DVT prophylaxis: SCD's / Lovenox. GI prophylaxis: PPI Glucose control: Standard glycemic control Mobility: Bedrest. Last date of multidisciplinary goals of care discussion: Goals of care discussion 01/31/2020 with mother Family and staff present: Nurse and doctor Summary of discussion: Patient is full code. Continue aggressive care measures. Follow up goals of care discussion due:  Code Status: Full Family Communication: I updated patient's mother yesterday evening at bedside. Disposition: ICU  Labs   CBC: Recent  Labs  Lab 01/25/20 1235 01/25/20 1600 01/27/20 1049 01/27/20 1054 01/28/20 0258 01/28/20 0258 01/28/20 0925 01/28/20 2354 01/29/20 0253 01/30/20 0352 01/31/20 0306  WBC 16.7*   < > 17.9*  --  16.7*  --   --   --  17.2* 15.9* 19.9*  NEUTROABS 13.6*  --   --   --   --   --   --   --   --   --  13.6*  HGB 16.3   < > 14.8   < > 13.3   < > 11.9* 12.9* 10.9* 10.9* 11.6*  HCT 50.1   < > 43.7   < > 38.1*   < > 35.0* 38.0* 34.0* 32.2* 35.8*  MCV 94.0   < > 89.9  --  90.5  --   --   --  94.2 92.8 93.0  PLT 321   < > 148*  --  126*  --   --   --  127* 155 190   < > = values in this interval not displayed.    Basic Metabolic Panel: Recent Labs  Lab 01/27/20 0834 01/28/20 1435 01/28/20 1435 01/28/20 1714 01/28/20 1714 01/28/20 2354 01/29/20 0253 01/29/20 1706 01/30/20 0352 01/30/20 1614 01/30/20 2006 01/31/20 0306  NA  --  149*   < >  --   --  151* 147*  --  148* 148*  --  146*  K  --  3.4*   < >  --    < >  3.8 3.7  --  3.1* 3.5 3.4* 3.1*  CL  --  120*  --   --   --   --  111  --  109 110  --  105  CO2  --  23  --   --   --   --  24  --  25 26  --  26  GLUCOSE  --  171*  --   --   --   --  189*  --  242* 198*  --  240*  BUN  --  51*  --   --   --   --  49*  --  47* 45*  --  54*  CREATININE  --  1.48*  --   --   --   --  1.48*  --  1.35* 1.28*  --  1.45*  CALCIUM  --  7.2*  --   --   --   --  8.4*  --  9.1 9.4  --  9.4  MG   < >  --   --  2.2   < >  --  2.4 2.3 2.1  --  2.1 2.1  PHOS   < >  --   --  1.6*  --   --  1.9* 2.3* 2.5  --   --  3.3   < > = values in this interval not displayed.   GFR: Estimated Creatinine Clearance: 80.9 mL/min (A) (by C-G formula based on SCr of 1.45 mg/dL (H)). Recent Labs  Lab 01/26/20 0104 01/26/20 0345 01/26/20 1615 01/26/20 1637 01/26/20 2035 01/26/20 2341 01/27/20 1049 01/28/20 0258 01/29/20 0253 01/30/20 0352 01/31/20 0306  PROCALCITON 5.93  --   --   --   --   --   --   --   --   --   --   WBC  --    < >  --  16.2*  --   --    < >  16.7* 17.2* 15.9* 19.9*  LATICACIDVEN 4.1*   < > 6.8* 3.7* 2.4* 2.9*  --   --   --   --   --    < > = values in this interval not displayed.    Liver Function Tests: Recent Labs  Lab 01/27/20 2320 01/28/20 0258 01/28/20 1435 01/30/20 1614 01/31/20 0306  AST 73* 58* 47* 94* 88*  ALT 41 38 34 70* 76*  ALKPHOS 80 77 75 201* 252*  BILITOT 1.6* 1.0 1.0 0.9 0.8  PROT 4.9* 5.0* 4.6* 6.2* 6.4*  ALBUMIN 1.9* 1.8* 1.6* 1.8* 1.9*   Recent Labs  Lab 01/26/20 1615 01/29/20 0253  LIPASE 315* 24   Recent Labs  Lab 01/30/20 1603  AMMONIA 27    ABG    Component Value Date/Time   PHART 7.448 01/28/2020 2354   PCO2ART 36.7 01/28/2020 2354   PO2ART 76 (L) 01/28/2020 2354   HCO3 25.4 01/28/2020 2354   TCO2 26 01/28/2020 2354   ACIDBASEDEF 5.0 (H) 01/26/2020 2111   O2SAT 96.0 01/28/2020 2354     Coagulation Profile: No results for input(s): INR, PROTIME in the last 168 hours.  Cardiac Enzymes: No results for input(s): CKTOTAL, CKMB, CKMBINDEX, TROPONINI in the last 168 hours.  HbA1C: Hgb A1c MFr Bld  Date/Time Value Ref Range Status  01/25/2020 05:40 PM 13.4 (H) 4.8 - 5.6 % Final    Comment:    (NOTE) Pre diabetes:  5.7%-6.4%  Diabetes:              >6.4%  Glycemic control for   <7.0% adults with diabetes     CBG: Recent Labs  Lab 01/30/20 1122 01/30/20 1545 01/30/20 2012 01/30/20 2307 01/31/20 0311  GLUCAP 199* 181* 143* 203* 220*    This patient is critically ill with multiple organ system failure; which, requires frequent high complexity decision making, assessment, support, evaluation, and titration of therapies. This was completed through the application of advanced monitoring technologies and extensive interpretation of multiple databases. During this encounter critical care time was devoted to patient care services described in this note for 32 minutes.  Josephine Igo, DO  Pulmonary Critical Care 01/31/2020 7:02 AM

## 2020-01-31 NOTE — Progress Notes (Signed)
Pt transported to and from MRI on the ventilator without incident. 

## 2020-02-01 ENCOUNTER — Inpatient Hospital Stay (HOSPITAL_COMMUNITY): Payer: Medicaid Other

## 2020-02-01 ENCOUNTER — Encounter (HOSPITAL_COMMUNITY): Payer: Self-pay | Admitting: Internal Medicine

## 2020-02-01 DIAGNOSIS — J9690 Respiratory failure, unspecified, unspecified whether with hypoxia or hypercapnia: Secondary | ICD-10-CM | POA: Diagnosis present

## 2020-02-01 DIAGNOSIS — E0811 Diabetes mellitus due to underlying condition with ketoacidosis with coma: Secondary | ICD-10-CM | POA: Diagnosis not present

## 2020-02-01 DIAGNOSIS — K859 Acute pancreatitis without necrosis or infection, unspecified: Secondary | ICD-10-CM | POA: Diagnosis not present

## 2020-02-01 LAB — GLUCOSE, CAPILLARY
Glucose-Capillary: 104 mg/dL — ABNORMAL HIGH (ref 70–99)
Glucose-Capillary: 101 mg/dL — ABNORMAL HIGH (ref 70–99)
Glucose-Capillary: 98 mg/dL (ref 70–99)
Glucose-Capillary: 118 mg/dL — ABNORMAL HIGH (ref 70–99)
Glucose-Capillary: 179 mg/dL — ABNORMAL HIGH (ref 70–99)
Glucose-Capillary: 278 mg/dL — ABNORMAL HIGH (ref 70–99)

## 2020-02-01 LAB — BASIC METABOLIC PANEL
Anion gap: 16 — ABNORMAL HIGH (ref 5–15)
Anion gap: 13 (ref 5–15)
BUN: 70 mg/dL — ABNORMAL HIGH (ref 6–20)
BUN: 87 mg/dL — ABNORMAL HIGH (ref 6–20)
CO2: 27 mmol/L (ref 22–32)
CO2: 27 mmol/L (ref 22–32)
Calcium: 9.1 mg/dL (ref 8.9–10.3)
Calcium: 8.6 mg/dL — ABNORMAL LOW (ref 8.9–10.3)
Chloride: 104 mmol/L (ref 98–111)
Chloride: 104 mmol/L (ref 98–111)
Creatinine, Ser: 2.16 mg/dL — ABNORMAL HIGH (ref 0.61–1.24)
Creatinine, Ser: 2.52 mg/dL — ABNORMAL HIGH (ref 0.61–1.24)
GFR, Estimated: 38 mL/min — ABNORMAL LOW (ref >60)
GFR, Estimated: 32 mL/min — ABNORMAL LOW (ref >60)
Glucose, Bld: 135 mg/dL — ABNORMAL HIGH (ref 70–99)
Glucose, Bld: 327 mg/dL — ABNORMAL HIGH (ref 70–99)
Potassium: 3.6 mmol/L (ref 3.5–5.1)
Potassium: 3.3 mmol/L — ABNORMAL LOW (ref 3.5–5.1)
Sodium: 147 mmol/L — ABNORMAL HIGH (ref 135–145)
Sodium: 144 mmol/L (ref 135–145)

## 2020-02-01 LAB — POCT I-STAT 7, (LYTES, BLD GAS, ICA,H+H)
Acid-Base Excess: 8 mmol/L — ABNORMAL HIGH (ref 0.0–2.0)
Bicarbonate: 31.5 mmol/L — ABNORMAL HIGH (ref 20.0–28.0)
Calcium, Ion: 1.23 mmol/L (ref 1.15–1.40)
HCT: 30 % — ABNORMAL LOW (ref 39.0–52.0)
Hemoglobin: 10.2 g/dL — ABNORMAL LOW (ref 13.0–17.0)
O2 Saturation: 88 %
Patient temperature: 100.3
Potassium: 2.8 mmol/L — ABNORMAL LOW (ref 3.5–5.1)
Sodium: 149 mmol/L — ABNORMAL HIGH (ref 135–145)
TCO2: 33 mmol/L — ABNORMAL HIGH (ref 22–32)
pCO2 arterial: 41.6 mmHg (ref 32.0–48.0)
pH, Arterial: 7.491 — ABNORMAL HIGH (ref 7.350–7.450)
pO2, Arterial: 53 mmHg — ABNORMAL LOW (ref 83.0–108.0)

## 2020-02-01 LAB — CBC
HCT: 33.7 % — ABNORMAL LOW (ref 39.0–52.0)
Hemoglobin: 11.3 g/dL — ABNORMAL LOW (ref 13.0–17.0)
MCH: 30.4 pg (ref 26.0–34.0)
MCHC: 33.5 g/dL (ref 30.0–36.0)
MCV: 90.6 fL (ref 80.0–100.0)
Platelets: 222 10*3/uL (ref 150–400)
RBC: 3.72 MIL/uL — ABNORMAL LOW (ref 4.22–5.81)
RDW: 15.1 % (ref 11.5–15.5)
WBC: 27.7 10*3/uL — ABNORMAL HIGH (ref 4.0–10.5)
nRBC: 0.5 % — ABNORMAL HIGH (ref 0.0–0.2)

## 2020-02-01 LAB — LACTIC ACID, PLASMA: Lactic Acid, Venous: 1.4 mmol/L (ref 0.5–1.9)

## 2020-02-01 LAB — TSH: TSH: 1.372 u[IU]/mL (ref 0.350–4.500)

## 2020-02-01 MED ORDER — MIDAZOLAM BOLUS VIA INFUSION
1.0000 mg | INTRAVENOUS | Status: DC | PRN
  Administered 2020-02-02 – 2020-02-04 (×8): 2 mg via INTRAVENOUS
  Filled 2020-02-01: qty 2

## 2020-02-01 MED ORDER — FUROSEMIDE 10 MG/ML IJ SOLN
80.0000 mg | Freq: Four times a day (QID) | INTRAMUSCULAR | Status: AC
  Administered 2020-02-01 (×3): 80 mg via INTRAVENOUS
  Filled 2020-02-01 (×3): qty 8

## 2020-02-01 MED ORDER — POTASSIUM CHLORIDE 20 MEQ PO PACK
40.0000 meq | PACK | Freq: Two times a day (BID) | ORAL | Status: DC
  Administered 2020-02-01 (×2): 40 meq
  Filled 2020-02-01 (×2): qty 2

## 2020-02-01 MED ORDER — MIDAZOLAM 50MG/50ML (1MG/ML) PREMIX INFUSION
0.0000 mg/h | INTRAVENOUS | Status: DC
  Administered 2020-02-01: 4 mg/h via INTRAVENOUS
  Administered 2020-02-01: 2 mg/h via INTRAVENOUS
  Administered 2020-02-02: 6 mg/h via INTRAVENOUS
  Administered 2020-02-02: 10 mg/h via INTRAVENOUS
  Administered 2020-02-02: 6 mg/h via INTRAVENOUS
  Administered 2020-02-03: 9 mg/h via INTRAVENOUS
  Administered 2020-02-03: 4 mg/h via INTRAVENOUS
  Administered 2020-02-04 (×4): 2 mg/h via INTRAVENOUS
  Administered 2020-02-05: 4 mg/h via INTRAVENOUS
  Filled 2020-02-01 (×10): qty 50

## 2020-02-01 MED ORDER — MIDAZOLAM HCL 2 MG/2ML IJ SOLN
2.0000 mg | INTRAMUSCULAR | Status: DC | PRN
  Administered 2020-02-01: 2 mg via INTRAVENOUS
  Filled 2020-02-01: qty 2

## 2020-02-01 MED ORDER — ALTEPLASE 2 MG IJ SOLR
2.0000 mg | Freq: Once | INTRAMUSCULAR | Status: AC
  Administered 2020-02-01: 2 mg
  Filled 2020-02-01: qty 2

## 2020-02-01 MED ORDER — POTASSIUM CHLORIDE 10 MEQ/100ML IV SOLN
10.0000 meq | INTRAVENOUS | Status: AC
  Administered 2020-02-01 (×4): 10 meq via INTRAVENOUS
  Filled 2020-02-01 (×4): qty 100

## 2020-02-01 MED ORDER — METOLAZONE 2.5 MG PO TABS
10.0000 mg | ORAL_TABLET | Freq: Once | ORAL | Status: AC
  Administered 2020-02-01: 10 mg via ORAL
  Filled 2020-02-01: qty 4

## 2020-02-01 MED ORDER — DEXMEDETOMIDINE HCL IN NACL 400 MCG/100ML IV SOLN
0.4000 ug/kg/h | INTRAVENOUS | Status: DC
  Administered 2020-02-01 – 2020-02-02 (×6): 1.2 ug/kg/h via INTRAVENOUS
  Administered 2020-02-02: 1 ug/kg/h via INTRAVENOUS
  Administered 2020-02-02 (×2): 1.2 ug/kg/h via INTRAVENOUS
  Administered 2020-02-03: 0.4 ug/kg/h via INTRAVENOUS
  Administered 2020-02-03: 1 ug/kg/h via INTRAVENOUS
  Administered 2020-02-03 (×2): 1.2 ug/kg/h via INTRAVENOUS
  Administered 2020-02-04: 1 ug/kg/h via INTRAVENOUS
  Administered 2020-02-04: 0.9 ug/kg/h via INTRAVENOUS
  Administered 2020-02-04 – 2020-02-06 (×14): 1.2 ug/kg/h via INTRAVENOUS
  Administered 2020-02-07: 02:00:00 1.1 ug/kg/h via INTRAVENOUS
  Filled 2020-02-01 (×4): qty 100
  Filled 2020-02-01: qty 200
  Filled 2020-02-01 (×4): qty 100
  Filled 2020-02-01 (×2): qty 200
  Filled 2020-02-01 (×2): qty 100
  Filled 2020-02-01: qty 200
  Filled 2020-02-01 (×2): qty 100
  Filled 2020-02-01: qty 200
  Filled 2020-02-01: qty 100
  Filled 2020-02-01: qty 200
  Filled 2020-02-01 (×2): qty 100
  Filled 2020-02-01: qty 200
  Filled 2020-02-01 (×4): qty 100
  Filled 2020-02-01 (×2): qty 200
  Filled 2020-02-01 (×2): qty 100

## 2020-02-01 NOTE — Progress Notes (Signed)
Arrived to find pt has 2 PIV in the right [facility] placed by Dr. Tonia Brooms. Right and left arms are both edematous. Bryan [name]

## 2020-02-01 NOTE — Progress Notes (Signed)
eLink Physician-Brief Progress Note Patient Name: Bryan [name] DOB: 07-Nov-1975 MRN: [id number]   Date of Service  02/01/2020  HPI/Events of Note  Request for labs and CXR K 3.1 and Na 146 yesterday Patient remains intubated  eICU Interventions  CBC, BMP, CXR ordered     Intervention Category Minor Interventions: Routine modifications to care plan (e.g. PRN medications for pain, fever)  Rosalie Gums Rashea Hoskie 02/01/2020, 3:43 AM

## 2020-02-01 NOTE — Progress Notes (Signed)
Pulled back 3 cc from each of Proximal & distal lines on CVC catheter. Both had great blood return.  tPA had been instilled this am @ 0515. Both lines flushed with 10 cc of Sterile saline. Distal flushed without incident very easily. Proximal flushed with 10 cc Sterile normal saline was just a little sluggish.RN aware that lines are ready to use. Placed new caps on each line.

## 2020-02-01 NOTE — Progress Notes (Addendum)
NAME:  Bryan [name], MRN:  [id number], DOB:  05-Jun-1975, LOS: 8 ADMISSION DATE:  01/25/2020, CONSULTATION DATE:  01/25/20 REFERRING MD:  Claudette Laws CHIEF COMPLAINT:  AMS    Brief History   Bryan [name] a 44 y.o.maleadmitted w/ DKA on 11/29.PCCM consulted 11/30 for agitation and borderline hypotension. Patient later had PEA cardiac arrest, 2 rounds of CPR when ROSC achieved, patient intubated. Transferred to ICU, CVL and arterial line placed.  CT Head is unremarkable except for right sinus disease. CT Chest/Abdomen/Pelvis showed evidence of moderate acute pancreatitis, mild ascites and small bilateral pleural effusions. Lipaseof315U/L on 01/26/20.No initial clear etiology for pancreatitis, mother denies patient using alcohol, CT a/p negative fordilated biliary ducts or gall stones. Triglycerides found to be600.   Past Medical History       Past Medical History:  Diagnosis Date  . Hypertension   . Psychiatric problem    Seen at [facility] for unknown reason. Takes seroquel. History of hearing voices.  Not commandivng voices.   . Schizophrenia (HCC)   . Tremors of nervous system     Significant Hospital Events   11/29- admitted 11/30-PEA Arrest and transfer to ICU  Consults:  PCCM  Procedures:  11/30 ETT 11/30 A line, removed 01/28/20 11/30 Left IJ CVL 11/30 NG tube placed  Significant Diagnostic Tests:  CXR 11/29-neg.  CT Head12/1 -right sinus disease, no acute intracranial pathology CT Chest/Abd/Pel12/1 -moderate pancreatis, mild asicites. Small bilateral pleural effusions Abd x-ray 12/1 - NG tube tip and side port in proximal stomach. No bowel obstruction or free air CXR 12/1 - No pneumothorax, developing pneumonia left lower lobe. Mild bibasilar atelectasis. RUQ [place] 12/1 - GB sludge, negative acute cholecystitis. Fatty liver. Small ascites/partially visualized right pleural effusion  TTE Echo 12/1 - Gr I diastolic dysfunction LE [place] 12/1  - No evidence DVT in either LE  CXR 01/29/20 - Lines and tubes in stable position. New infiltrate right lung base. Persistent bibasilar atelectasis.   LUE [place] 01/29/20 - No evidence of DVT  EEG 12/4>>severe diffuse encephalopathy  MRI Brain 12/5>>No acute findings   Micro Data:  11/30 Bld Cx- no growth 72 hrs 11/30 MRSA - negative  Antimicrobials:  01/26/2020 cefepime- Day 5/7 01/26/2020- d/c'd 12/1vancomycin  Interim history/subjective:  Remains intubated with poor mental status and increasing leukocytosis and worsening renal failure.  Remains on Fentanyl and Precedex, becomes dyssynchronous with vent with sedation wean  Objective   Blood pressure 128/62, pulse (!) 112, temperature 100.2 F (37.9 C), temperature source Rectal, resp. rate (!) 22, height 5\' 10"  (1.778 m), weight 107.9 kg, SpO2 96 %. CVP:  [10 mmHg-16 mmHg] 13 mmHg   >  Vent Mode: PSV FiO2 (%):  [40 %] 40 % Set Rate:  [18 bmp] 18 bmp Vt Set:  [510 mL] 510 mL PEEP:  [5 cmH20] 5 cmH20 Pressure Support:  [0 cmH20] 0 cmH20 Plateau Pressure:  [17 cmH20-18 cmH20] 18 cmH20     Intake/Output Summary (Last 24 hours) at 02/01/2020 0724 Last data filed at 02/01/2020 0600    Gross per 24 hour  Intake 3602.45 ml  Output 4075 ml  Net -472.55 ml        Filed Weights   01/25/20 1257 01/31/20 0318 02/01/20 0458  Weight: 90.7 kg 108.3 kg 107.9 kg    Examination: General:Critically ill-appearing M on full vent support. 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 Echo and LE dopplers do not support PE Plan: -Off pressors and hemodynamically stable, but worsening leukocytosis and persistent fever today -persistent encephalopathy likely metabolic and possibly sepsis, Ammonia level is not elevated,  check TSH  -Continue Precedex and Fentanyl  Severe Pancreatitis  -abdominal distension, worsening renal failure and increasing WBC raise concern for necrosis/abscess Plan: -Repeat CT abd/pelvis, without contrast secondary to renal failure -Continue supportive care, on day #6 Cefepime -Tube feeds continued -check lactic acid  -consider repeat cultures and broadening antibiotic therapy  Diabetic Ketoacidosis, likely secondary to pancreatic inflammation New onset in setting of pancreatititis Plan: -Titrate SSI for goal CBG 140-180.  Acute Hypoxemic Respiratory Failure In setting of cardiac arrest, shock and pancreatitis At this point seems to be volume overloaded. I believe this is preventing liberation from the ventilator along with mental status. Plan: -Continue diuresis with increased Lasix and Spironolactone, 1400cc UOP yesterday, but remains +11L since admission --Maintain full vent support with SAT/SBT as tolerated -titrate Vent setting to maintain SpO2 greater than or equal to 90%. -HOB elevated 30 degrees. -Plateau pressures less than 30 cm H20.  -Follow chest x-ray, ABGprn.  -Bronchial hygiene and RT/bronchodilator protocol.   New Right Lower Opacity, bilateral infiltrates Likely aspiration pneumonia. CXR slightly improved today Plan: -continue Cefepime  Acute Kidney Injury Hypernatremia Hypokalemia Increasing creatinine today Plan: -CT abd/pelvis as above -Follow urine output, electrolytes -Plan  replace as needed. -Goal potassium greater than 4, magnesium greater than 2. -Continue to follow urine output.  Volume Overload Status Patient has been net positive I/O since admission.  Patient still with positive cumulative fluid balance. Plan: -Repeat 40 mg Lasix every 6 hours +5 mg metolazone.  Best practice:  Diet: NPO.Trickle Tube Feeds Pain/Anxiety/Delirium protocol (if indicated): Fentanyl, precedex. Wean today VAP protocol (if indicated): Ordered DVT prophylaxis: SCD's / Lovenox. GI prophylaxis: PPI Glucose control: Standard glycemic control Mobility: Bedrest. Last date of multidisciplinary goals of care discussion: Goals of care discussion 01/31/2020 with mother Family and staff present: Nurse and doctor Summary of discussion: Patient is full code. Continue aggressive care measures. Follow up goals of care discussion due:  Code Status: Full Family Communication: Mother updated 12/6 Disposition: ICU  Labs   CBC: Last Labs              Recent Labs  Lab 01/25/20 1235 01/25/20 1600 01/28/20 0258 01/28/20 0925 01/28/20 2354 01/29/20 0253 01/30/20 0352 01/31/20 0306 02/01/20 0403  WBC 16.7*   < > 16.7*  --   --  17.2* 15.9* 19.9* 27.7*  NEUTROABS 13.6*  --   --   --   --   --   --  13.6*  --   HGB 16.3   < > 13.3   < > 12.9* 10.9* 10.9* 11.6* 11.3*  HCT 50.1   < > 38.1*   < > 38.0* 34.0* 32.2* 35.8* 33.7*  MCV 94.0   < > 90.5  --   --  94.2 92.8 93.0 90.6  PLT 321   < > 126*  --   --  127* 155 190 222   < > =  values in this interval not displayed.      Basic Metabolic Panel: Last Labs               Recent Labs  Lab 01/28/20 1714 01/28/20 2354 01/29/20 0253 01/29/20 0253 01/29/20 1706 01/30/20 0352 01/30/20 1614 01/30/20 2006 01/31/20 0306 02/01/20 0403  NA  --    < > 147*  --   --  148* 148*  --  146* 147*  K  --    < > 3.7   < >  --  3.1* 3.5 3.4* 3.1* 3.6  CL  --   --  111  --   --  109 110  --  105 104  CO2  --   --  24  --   --  25 26   --  26 27  GLUCOSE  --   --  189*  --   --  242* 198*  --  240* 135*  BUN  --   --  49*  --   --  47* 45*  --  54* 70*  CREATININE  --   --  1.48*  --   --  1.35* 1.28*  --  1.45* 2.16*  CALCIUM  --   --  8.4*  --   --  9.1 9.4  --  9.4 9.1  MG 2.2  --  2.4  --  2.3 2.1  --  2.1 2.1  --   PHOS 1.6*  --  1.9*  --  2.3* 2.5  --   --  3.3  --    < > = values in this interval not displayed.     GFR: Estimated Creatinine Clearance: 54.3 mL/min (A) (by C-G formula based on SCr of 2.16 mg/dL (H)). Last Labs                 Recent Labs  Lab 01/26/20 0104 01/26/20 0345 01/26/20 1615 01/26/20 1637 01/26/20 2035 01/26/20 2341 01/27/20 1049 01/29/20 0253 01/30/20 0352 01/31/20 0306 02/01/20 0403   PROCALCITON 5.93  --   --   --   --   --   --   --   --   --   --    WBC  --    < >  --  16.2*  --   --    < > 17.2* 15.9* 19.9* 27.7*   LATICACIDVEN 4.1*   < > 6.8* 3.7* 2.4* 2.9*  --   --   --   --   --     < > = values in this interval not displayed.      Liver Function Tests: Last Labs          Recent Labs  Lab 01/27/20 2320 01/28/20 0258 01/28/20 1435 01/30/20 1614 01/31/20 0306  AST 73* 58* 47* 94* 88*  ALT 41 38 34 70* 76*  ALKPHOS 80 77 75 201* 252*  BILITOT 1.6* 1.0 1.0 0.9 0.8  PROT 4.9* 5.0* 4.6* 6.2* 6.4*  ALBUMIN 1.9* 1.8* 1.6* 1.8* 1.9*     Last Labs       Recent Labs  Lab 01/26/20 1615 01/29/20 0253  LIPASE 315* 24     Last Labs      Recent Labs  Lab 01/30/20 1603  AMMONIA 27      ABG Labs (Brief)          Component Value Date/Time   PHART 7.448 01/28/2020 2354   PCO2ART 36.7  01/28/2020 2354   PO2ART 76 (L) 01/28/2020 2354   HCO3 25.4 01/28/2020 2354   TCO2 26 01/28/2020 2354   ACIDBASEDEF 5.0 (H) 01/26/2020 2111   O2SAT 96.0 01/28/2020 2354       Coagulation Profile: Last Labs   No results for input(s): INR, PROTIME in the last 168 hours.    Cardiac Enzymes: Last Labs   No results for input(s): CKTOTAL, CKMB,  CKMBINDEX, TROPONINI in the last 168 hours.    HbA1C: Last Labs         Hgb A1c MFr Bld  Date/Time Value Ref Range Status  01/25/2020 05:40 PM 13.4 (H) 4.8 - 5.6 % Final    Comment:    (NOTE) Pre diabetes:          5.7%-6.4%  Diabetes:              >6.4%  Glycemic control for   <7.0% adults with diabetes       CBG: Last Labs          Recent Labs  Lab 01/31/20 1156 01/31/20 1526 01/31/20 2005 01/31/20 2348 02/01/20 0419  GLUCAP 178* 144* 118* 121* 104*      CRITICAL CARE Performed by: Darcella GasmanLaura R Gleason   Total critical care time: 40 minutes  Critical care time was exclusive of separately billable procedures and treating other patients.  Critical care was necessary to treat or prevent imminent or life-threatening deterioration.  Critical care was time spent personally by me on the following activities: development of treatment plan with patient and/or surrogate as well as nursing, discussions with consultants, evaluation of patient's response to treatment, examination of patient, obtaining history from patient or surrogate, ordering and performing treatments and interventions, ordering and review of laboratory studies, ordering and review of radiographic studies, pulse oximetry and re-evaluation of patient's condition.   Darcella GasmanLaura R Gleason, PA-C [facility]  Pager# [phone], if no answer [phone]    Pulmonary critical care attending:  This is a 44 year old male admitted for DKA, acute pancreatitis suffered a brief PEA cardiac arrest on admission.  Currently remains in the ICU on mechanical life support, positive fluid balance.  Work-up for pancreatitis has subsequently been negative.  IgG4 pending.  BP 100/69   Pulse 89   Temp 99 F (37.2 C) (Axillary)   Resp 15   Ht 5\' 10"  (1.778 m)   Wt 107.9 kg   SpO2 98%   BMI 34.13 kg/m   General: Middle-aged male intubated on mechanical life support HEENT: Endotracheal tube in place Heart:  Regular rhythm S1-S2 Lungs: Bilateral mechanically ventilated breath sounds Abdomen: Distended  Labs: Reviewed Urine output reviewed  Assessment: Severe pancreatitis, shock, ongoing fevers, acute metabolic encephalopathy secondary to above, DKA, resolved likely related to pancreatic inflammation on admission. Patient with ongoing fevers I presume are related to pancreatitis. Remains on antibiotics at this time. Abdomen abdominal CT today with no significant fluid collection within the abdomen. AKI, resolved, now with good urine output Positive cumulative fluid balance. I am unclear of the patient's reason for ongoing persistent encephalopathy.  Possibly related to sedation but his EEG and MRI are negative.  Plan: Diuresis again today Lasix plus metolazone Continue antibiotics CT abdomen for evaluation of pancreas and any fluid collection or cyst formation. SBT SAT once able.  This patient is critically ill with multiple organ system failure; which, requires frequent high complexity decision making, assessment, support, evaluation, and titration of therapies. This was completed through the application of advanced monitoring technologies  and extensive interpretation of multiple databases. During this encounter critical care time was devoted to patient care services described in this note for 32 minutes.  Josephine Igo, DO [facility] 02/01/2020 1:49 PM

## 2020-02-01 NOTE — Progress Notes (Signed)
Ventilator patient transported from 2M01 to CT and back without any complications.

## 2020-02-02 ENCOUNTER — Inpatient Hospital Stay (HOSPITAL_COMMUNITY): Payer: Medicaid Other

## 2020-02-02 DIAGNOSIS — R509 Fever, unspecified: Secondary | ICD-10-CM | POA: Diagnosis not present

## 2020-02-02 DIAGNOSIS — K859 Acute pancreatitis without necrosis or infection, unspecified: Secondary | ICD-10-CM | POA: Diagnosis not present

## 2020-02-02 DIAGNOSIS — E0811 Diabetes mellitus due to underlying condition with ketoacidosis with coma: Secondary | ICD-10-CM | POA: Diagnosis not present

## 2020-02-02 LAB — COMPREHENSIVE METABOLIC PANEL
ALT: 58 U/L — ABNORMAL HIGH (ref 0–44)
AST: 55 U/L — ABNORMAL HIGH (ref 15–41)
Albumin: 1.7 g/dL — ABNORMAL LOW (ref 3.5–5.0)
Alkaline Phosphatase: 197 U/L — ABNORMAL HIGH (ref 38–126)
Anion gap: 14 (ref 5–15)
BUN: 99 mg/dL — ABNORMAL HIGH (ref 6–20)
CO2: 29 mmol/L (ref 22–32)
Calcium: 9 mg/dL (ref 8.9–10.3)
Chloride: 103 mmol/L (ref 98–111)
Creatinine, Ser: 2.75 mg/dL — ABNORMAL HIGH (ref 0.61–1.24)
GFR, Estimated: 28 mL/min — ABNORMAL LOW (ref >60)
Glucose, Bld: 370 mg/dL — ABNORMAL HIGH (ref 70–99)
Potassium: 3.6 mmol/L (ref 3.5–5.1)
Sodium: 146 mmol/L — ABNORMAL HIGH (ref 135–145)
Total Bilirubin: 0.6 mg/dL (ref 0.3–1.2)
Total Protein: 6.3 g/dL — ABNORMAL LOW (ref 6.5–8.1)

## 2020-02-02 LAB — CBC
HCT: 31 % — ABNORMAL LOW (ref 39.0–52.0)
Hemoglobin: 10 g/dL — ABNORMAL LOW (ref 13.0–17.0)
MCH: 30.3 pg (ref 26.0–34.0)
MCHC: 32.3 g/dL (ref 30.0–36.0)
MCV: 93.9 fL (ref 80.0–100.0)
Platelets: 226 10*3/uL (ref 150–400)
RBC: 3.3 MIL/uL — ABNORMAL LOW (ref 4.22–5.81)
RDW: 15.7 % — ABNORMAL HIGH (ref 11.5–15.5)
WBC: 29.4 10*3/uL — ABNORMAL HIGH (ref 4.0–10.5)
nRBC: 0.5 % — ABNORMAL HIGH (ref 0.0–0.2)

## 2020-02-02 LAB — GLUCOSE, CAPILLARY
Glucose-Capillary: 295 mg/dL — ABNORMAL HIGH (ref 70–99)
Glucose-Capillary: 297 mg/dL — ABNORMAL HIGH (ref 70–99)
Glucose-Capillary: 196 mg/dL — ABNORMAL HIGH (ref 70–99)
Glucose-Capillary: 73 mg/dL (ref 70–99)
Glucose-Capillary: 90 mg/dL (ref 70–99)
Glucose-Capillary: 98 mg/dL (ref 70–99)
Glucose-Capillary: 116 mg/dL — ABNORMAL HIGH (ref 70–99)

## 2020-02-02 LAB — CK: Total CK: 524 U/L — ABNORMAL HIGH (ref 49–397)

## 2020-02-02 LAB — IGG 4: IgG, Subclass 4: 14 mg/dL (ref 2–96)

## 2020-02-02 MED ORDER — INSULIN ASPART 100 UNIT/ML ~~LOC~~ SOLN
14.0000 [IU] | SUBCUTANEOUS | Status: DC
  Administered 2020-02-02 – 2020-02-03 (×4): 14 [IU] via SUBCUTANEOUS

## 2020-02-02 MED ORDER — OXYCODONE HCL 5 MG/5ML PO SOLN
5.0000 mg | Freq: Four times a day (QID) | ORAL | Status: DC
  Administered 2020-02-02 – 2020-02-04 (×8): 5 mg
  Filled 2020-02-02 (×9): qty 5

## 2020-02-02 MED ORDER — SODIUM CHLORIDE 0.9 % IV SOLN
2.0000 g | Freq: Two times a day (BID) | INTRAVENOUS | Status: DC
  Administered 2020-02-02 – 2020-02-07 (×11): 2 g via INTRAVENOUS
  Filled 2020-02-02 (×13): qty 2

## 2020-02-02 MED ORDER — VANCOMYCIN HCL 2000 MG/400ML IV SOLN
2000.0000 mg | Freq: Once | INTRAVENOUS | Status: AC
  Administered 2020-02-02: 2000 mg via INTRAVENOUS
  Filled 2020-02-02: qty 400

## 2020-02-02 MED ORDER — INSULIN DETEMIR 100 UNIT/ML ~~LOC~~ SOLN
40.0000 [IU] | Freq: Two times a day (BID) | SUBCUTANEOUS | Status: DC
  Administered 2020-02-02 – 2020-02-12 (×22): 40 [IU] via SUBCUTANEOUS
  Filled 2020-02-02 (×24): qty 0.4

## 2020-02-02 MED ORDER — POTASSIUM CHLORIDE 10 MEQ/100ML IV SOLN
10.0000 meq | INTRAVENOUS | Status: AC
  Administered 2020-02-02 (×4): 10 meq via INTRAVENOUS
  Filled 2020-02-02 (×4): qty 100

## 2020-02-02 MED ORDER — CLONAZEPAM 1 MG PO TABS
1.0000 mg | ORAL_TABLET | Freq: Two times a day (BID) | ORAL | Status: DC
  Administered 2020-02-02 – 2020-02-05 (×8): 1 mg
  Filled 2020-02-02 (×9): qty 1

## 2020-02-02 MED ORDER — POTASSIUM CHLORIDE 20 MEQ PO PACK
40.0000 meq | PACK | Freq: Two times a day (BID) | ORAL | Status: AC
  Administered 2020-02-02: 40 meq
  Filled 2020-02-02: qty 2

## 2020-02-02 MED ORDER — SODIUM CHLORIDE 0.9 % IV SOLN
2.0000 g | Freq: Two times a day (BID) | INTRAVENOUS | Status: DC
  Filled 2020-02-02 (×2): qty 2

## 2020-02-02 MED ORDER — VANCOMYCIN HCL 1250 MG/250ML IV SOLN
1250.0000 mg | INTRAVENOUS | Status: DC
  Administered 2020-02-03 – 2020-02-06 (×4): 1250 mg via INTRAVENOUS
  Filled 2020-02-02 (×4): qty 250

## 2020-02-02 NOTE — Progress Notes (Signed)
Pharmacy Antibiotic Note  Bryan [name] is a 44 y.o. male admitted on 01/25/2020 with acute pancreatitis and DKA, currently with rising WBC, recent fevers concerning for worsening sepsis.  Pharmacy has been consulted for Merrem and Vancomycin dosing. SCr rising at 2.75 with aggressive diuresis last 2 days - holding on further diuresis. Lactic acid is trending down. WBC continues to climb. All cultures have been negative. CVC removed this AM.   Plan: Merrem 2 g IV every 12 hours.  Vancomycin 2000 mg IV now, then 1250 mg IV every 24 hours.  Monitor renal function and clinical status  Height: 5\' 10"  (177.8 cm) Weight: 107.7 kg (237 lb 7 oz) IBW/kg (Calculated) : 73  Temp (24hrs), Avg:98.7 F (37.1 C), Min:98.3 F (36.8 C), Max:99.1 F (37.3 C)  Recent Labs  Lab 01/26/20 1405 01/26/20 1615 01/26/20 1637 01/26/20 1637 01/26/20 2035 01/26/20 2332 01/26/20 2341 01/27/20 0834 01/29/20 0253 01/29/20 0253 01/30/20 0352 01/30/20 0352 01/30/20 1614 01/31/20 0306 02/01/20 0403 02/01/20 0819 02/01/20 2125 02/02/20 0535  WBC  --   --  16.2*  --   --   --   --    < > 17.2*  --  15.9*  --   --  19.9* 27.7*  --   --  29.4*  CREATININE   < >  --  2.81*   < > 2.88*   < >  --    < > 1.48*   < > 1.35*   < > 1.28* 1.45* 2.16*  --  2.52* 2.75*  LATICACIDVEN  --  6.8* 3.7*  --  2.4*  --  2.9*  --   --   --   --   --   --   --   --  1.4  --   --    < > = values in this interval not displayed.    Estimated Creatinine Clearance: 42.6 mL/min (A) (by C-G formula based on SCr of 2.75 mg/dL (H)).    Allergies  Allergen Reactions  . Fluphenazine Shortness Of Breath and Swelling    Tongue swelling, and shaking  . Coffee Bean Extract [Coffea Arabica] Swelling    Antimicrobials this admission: Vancomycin 11/30 x1 dose; Restart 12/7 >> Cefepime 11/30 >>12/7 Meropenem 12/7 >>  Dose adjustments this admission:   Microbiology results: 11/30 Blood cultures - negative 11/30 MRSA PCR  negative 11/30 COVID/Flu negative  Thank you for allowing pharmacy to be a part of this patient's care.  12/30, PharmD, BCPS, BCCCP Clinical Pharmacist Please refer to [facility] for [facility] Pharmacy numbers 02/02/2020 9:22 AM

## 2020-02-02 NOTE — Progress Notes (Signed)
Lower extremity venous has been completed.   Preliminary results in CV Proc.   Blanch Media 02/02/2020 2:53 PM

## 2020-02-02 NOTE — Progress Notes (Addendum)
NAME:  Bryan [name], MRN:  [id number], DOB:  31-Aug-1975, LOS: 8 ADMISSION DATE:  01/25/2020, CONSULTATION DATE:  01/25/20 REFERRING MD:  Claudette Laws CHIEF COMPLAINT:  AMS    Brief History   Bryan [name] is a 44 y.o. male admitted w/ DKA on 11/29.  PCCM consulted 11/30 for agitation and borderline hypotension. Patient later had PEA cardiac arrest, 2 rounds of CPR when ROSC achieved, patient intubated. Transferred to ICU, CVL and arterial line placed.   CT Head is unremarkable except for right sinus disease. CT Chest/Abdomen/Pelvis showed evidence of moderate acute pancreatitis, mild ascites and small bilateral pleural effusions. Lipase of 315U/L on 01/26/20. No initial clear etiology for pancreatitis, mother denies patient using alcohol, CT a/p negative for dilated biliary ducts or gall stones. Triglycerides found to be 600.    Past Medical History   Past Medical History:  Diagnosis Date  . Hypertension   . Psychiatric problem    Seen at [facility] for unknown reason. Takes seroquel. History of hearing voices.  Not commandivng voices.   . Schizophrenia (HCC)   . Tremors of nervous system     Significant Hospital Events   11/29 - admitted 11/30 - PEA Arrest and transfer to ICU  Consults:  PCCM  Procedures:  11/30 ETT 11/30 A line, removed 01/28/20 11/30 Left IJ CVL 11/30 NG tube placed  Significant Diagnostic Tests:  CXR 11/29 - neg.  CT Head 12/1 - right sinus disease, no acute intracranial pathology CT Chest/Abd/Pel 12/1 -moderate pancreatis, mild asicites. Small bilateral pleural effusions Abd x-ray 12/1 - NG tube tip and side port in proximal stomach. No bowel obstruction or free air CXR 12/1 - No pneumothorax, developing pneumonia left lower lobe. Mild bibasilar atelectasis. RUQ [place] 12/1 - GB sludge, negative acute cholecystitis. Fatty liver. Small ascites/partially visualized right pleural effusion  TTE Echo 12/1 - Gr I diastolic dysfunction LE [place] 12/1 - No evidence  DVT in either LE  CXR 01/29/20 - Lines and tubes in stable position. New infiltrate right lung base. Persistent bibasilar atelectasis.   LUE [place] 01/29/20 - No evidence of DVT  EEG 12/4>>severe diffuse encephalopathy  MRI Brain 12/5>>No acute findings   Micro Data:  11/30 Bld Cx - no growth 5 days 11/30 MRSA - negative  Antimicrobials:  01/26/2020 cefepime - Day 5/7 01/26/2020 - d/c'd 12/1 vancomycin 02/02/2020 - DC cefepime. Start vancomycin/meropenem  Interim history/subjective:  Continues to become desynchronous when sedatives are decreased. Left central line removed this morning.    Objective   Blood pressure 126/78, pulse (!) 111, temperature 98.5 F (36.9 C), temperature source Axillary, resp. rate (!) 28, height 5\' 10"  (1.778 m), weight 107.7 kg, SpO2 97 %. CVP:  [2 mmHg-13 mmHg] 13 mmHg  Vent Mode: PSV FiO2 (%):  [40 %-60 %] 40 % Set Rate:  [18 bmp] 18 bmp Vt Set:  [510 mL] 510 mL PEEP:  [5 cmH20-8 cmH20] 5 cmH20 Pressure Support:  [5 cmH20] 5 cmH20 Plateau Pressure:  [16 cmH20-28 cmH20] 28 cmH20   Intake/Output Summary (Last 24 hours) at 02/02/2020 1014 Last data filed at 02/02/2020 1000 Gross per 24 hour  Intake 4818.34 ml  Output 5600 ml  Net -781.66 ml   Filed Weights   01/31/20 0318 02/01/20 0458 02/02/20 0500  Weight: 108.3 kg 107.9 kg 107.7 kg    Examination: General: Critically ill-appearing, on vent support HENT: Mucous membranes are dry. Lungs: Bilateral mechanically ventilated breath sounds, no wheezing or rhonchi Cardiovascular: Regular rhythm, S1-S2 Abdomen: Distended abdomen.  Less firm. Patient contracting abd muscles. Extremities: Diffuse upper extremity lower extremity dependent edema Neuro: examined on sedation, does not respond to pain or commands, is breathing over the vent GU: Foley catheter, yellow urine in bag.  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 Echo and LE dopplers do not support PE Plan: -off pressors and hemodynamically stable, but continues to have worsening leukocytosis and fever -infectious vs inflammatory etiology. Foley catheter changed 2 days ago and central line removed this morning. Negative BC from 01/26/20.  -will start vancomycin and meropenem -continue Precedex and Fentanyl   Severe Pancreatitis  Unclear etiology, Tg elevated upon admission. IgG negative. Mother denies alcohol and drug use. Imaging negative for biliary stricture or cholestasis. CT a/p yesterday no pancreatic hemorrhage, mass, or fluid collection seen. Decrease in abdominal edema.  Plan: -less firm on abd exam today, white count continues to trend upward -continue supportive care, abx changed to vancomycin and meropenem -tube feeds continued -lactic acid normal yesterday -consider repeat cultures and broadening antibiotic therapy -TSH negative   Diabetic Ketoacidosis, likely secondary to pancreatic inflammation New onset in setting of pancreatititis Plan: -Titrate SSI for goal CBG 140-180.  Acute Hypoxemic Respiratory Failure In setting of cardiac arrest, shock and pancreatitis Patient continues to desynchronous with vent when attempting to wean off of sedatives. Fluid overload status may be contributing. Will hold diuretics today and see if patient able to have output without them.  Plan: -fentanyl off, versed halved. Will start oxycodone and clonazepam. Wean as tolerable -hold diuretics -maintain full vent support with SAT/SBT as tolerated -titrate Vent setting to maintain SpO2 greater than or equal to 90%. -HOB elevated 30 degrees. -Plateau pressures less than 30 cm H20.  -Bronchial hygiene and RT/bronchodilator protocol.  New Right Lower Opacity, bilateral infiltrates Likely aspiration pneumonia. CXR with improvement of left basilar aeration Plan: -start vancomycin  and meropenem   Acute Kidney Injury Hypernatremia Hypokalemia Increasing creatinine. Sodium up 144>146. K of 3.6. Plan: -hold diuretics -follow urine output, electrolytes -plan replace as needed.  Volume Overload Status Patient has been net positive I/O since admission.  Patient still with positive cumulative fluid balance. Plan: -hold lasix at this time to trial urinary output with it and assess if kidney function improves  Best practice:  Diet: TF Pain/Anxiety/Delirium protocol (if indicated): Versed. Oxycodone. Clonazepam VAP protocol (if indicated): Ordered DVT prophylaxis: SCD's / Lovenox. GI prophylaxis: PPI Glucose control: Standard glycemic control Mobility: Bedrest. Last date of multidisciplinary goals of care discussion: Goals of care discussion 01/31/2020 with mother Family and staff present: Nurse and doctor Summary of discussion: Patient is full code. Continue aggressive care measures. Follow up goals of care discussion due:  Code Status: Full Family Communication: Mother updated 12/6 Disposition: ICU  Labs   CBC: Recent Labs  Lab 01/29/20 0253 01/29/20 0253 01/30/20 0352 01/31/20 0306 02/01/20 0403 02/01/20 1101 02/02/20 0535  WBC 17.2*  --  15.9* 19.9* 27.7*  --  29.4*  NEUTROABS  --   --   --  13.6*  --   --   --   HGB 10.9*   < > 10.9* 11.6* 11.3* 10.2* 10.0*  HCT 34.0*   < > 32.2* 35.8* 33.7* 30.0* 31.0*  MCV 94.2  --  92.8 93.0 90.6  --  93.9  PLT 127*  --  155 190 222  --  226   < > =  values in this interval not displayed.    Basic Metabolic Panel: Recent Labs  Lab 01/28/20 1714 01/28/20 2354 01/29/20 0253 01/29/20 0253 01/29/20 1706 01/30/20 0352 01/30/20 0352 01/30/20 1614 01/30/20 1614 01/30/20 2006 01/31/20 0306 02/01/20 0403 02/01/20 1101 02/01/20 2125 02/02/20 0535  NA  --    < > 147*   < >  --  148*   < > 148*   < >  --  146* 147* 149* 144 146*  K  --    < > 3.7   < >  --  3.1*   < > 3.5   < > 3.4* 3.1* 3.6 2.8* 3.3*  3.6  CL  --   --  111   < >  --  109   < > 110  --   --  105 104  --  104 103  CO2  --   --  24   < >  --  25   < > 26  --   --  26 27  --  27 29  GLUCOSE  --   --  189*   < >  --  242*   < > 198*  --   --  240* 135*  --  327* 370*  BUN  --   --  49*   < >  --  47*   < > 45*  --   --  54* 70*  --  87* 99*  CREATININE  --   --  1.48*   < >  --  1.35*   < > 1.28*  --   --  1.45* 2.16*  --  2.52* 2.75*  CALCIUM  --   --  8.4*   < >  --  9.1   < > 9.4  --   --  9.4 9.1  --  8.6* 9.0  MG 2.2  --  2.4  --  2.3 2.1  --   --   --  2.1 2.1  --   --   --   --   PHOS 1.6*  --  1.9*  --  2.3* 2.5  --   --   --   --  3.3  --   --   --   --    < > = values in this interval not displayed.   GFR: Estimated Creatinine Clearance: 42.6 mL/min (A) (by C-G formula based on SCr of 2.75 mg/dL (H)). Recent Labs  Lab 01/26/20 1637 01/26/20 2035 01/26/20 2341 01/27/20 1049 01/30/20 0352 01/31/20 0306 02/01/20 0403 02/01/20 0819 02/02/20 0535  WBC 16.2*  --   --    < > 15.9* 19.9* 27.7*  --  29.4*  LATICACIDVEN 3.7* 2.4* 2.9*  --   --   --   --  1.4  --    < > = values in this interval not displayed.   Liver Function Tests: Recent Labs  Lab 01/28/20 0258 01/28/20 1435 01/30/20 1614 01/31/20 0306 02/02/20 0535  AST 58* 47* 94* 88* 55*  ALT 38 34 70* 76* 58*  ALKPHOS 77 75 201* 252* 197*  BILITOT 1.0 1.0 0.9 0.8 0.6  PROT 5.0* 4.6* 6.2* 6.4* 6.3*  ALBUMIN 1.8* 1.6* 1.8* 1.9* 1.7*   Recent Labs  Lab 01/26/20 1615 01/29/20 0253  LIPASE 315* 24   Recent Labs  Lab 01/30/20 1603  AMMONIA 27   ABG    Component Value Date/Time   PHART 7.491 (  H) 02/01/2020 1101   PCO2ART 41.6 02/01/2020 1101   PO2ART 53 (L) 02/01/2020 1101   HCO3 31.5 (H) 02/01/2020 1101   TCO2 33 (H) 02/01/2020 1101   ACIDBASEDEF 5.0 (H) 01/26/2020 2111   O2SAT 88.0 02/01/2020 1101     Coagulation Profile: No results for input(s): INR, PROTIME in the last 168 hours.  Cardiac Enzymes: No results for input(s):  CKTOTAL, CKMB, CKMBINDEX, TROPONINI in the last 168 hours.  HbA1C: Hgb A1c MFr Bld  Date/Time Value Ref Range Status  01/25/2020 05:40 PM 13.4 (H) 4.8 - 5.6 % Final    Comment:    (NOTE) Pre diabetes:          5.7%-6.4%  Diabetes:              >6.4%  Glycemic control for   <7.0% adults with diabetes     CBG: Recent Labs  Lab 02/01/20 1516 02/01/20 1900 02/01/20 2314 02/02/20 0302 02/02/20 0706  GLUCAP 118* 179* 278* 295* 297*   Vasili Katsadouros Internal Medicine Resident PGY-1 [place]    Pulmonary critical care attending:  This is a 44 year old gentleman initially admitted for DKA, found to have severe pancreatitis developed respiratory failure was intubated placed on mechanical life support.  Patient has had increasing white blood cell count, fevers.  Central venous line was removed.  Started on broad-spectrum antibiotics.  BP 118/73   Pulse (!) 110   Temp 98.8 F (37.1 C) (Axillary)   Resp (!) 22   Ht 5\' 10"  (1.778 m)   Wt 107.7 kg   SpO2 97%   BMI 34.07 kg/m   General: Middle-aged male intubated on life support critically ill HEENT: Endotracheal tube in place Heart: Tachycardic, regular Lungs: Bilateral mechanically ventilated breath sounds Abdomen: Mildly distended  Labs: Sodium 146, creatinine 2.75, white blood cell count 29.4  Assessment: Severe pancreatitis ongoing Sepsis, unclear source Septic shock, SIRS response and PEA cardiac arrest, resolved Acute metabolic encephalopathy secondary to above Fluid overloaded, positive cumulative fluid balance likely from resuscitation from severe pancreatitis.  Plan: Trach aspirate pending Central venous catheter removed, line holiday for 24 hours May need another CVL Broad-spectrum antibiotics started. Bilateral lower extremity ultrasound, rule out DVT ongoing because of fever in the ICU. Increase orals for oxycodone in an effort to try to come off on continuous sedation needs.  This patient is  critically ill with multiple organ system failure; which, requires frequent high complexity decision making, assessment, support, evaluation, and titration of therapies. This was completed through the application of advanced monitoring technologies and extensive interpretation of multiple databases. During this encounter critical care time was devoted to patient care services described in this note for 33 minutes.  , DO [facility] 02/02/2020 1:12 PM

## 2020-02-03 LAB — COMPREHENSIVE METABOLIC PANEL
ALT: 65 U/L — ABNORMAL HIGH (ref 0–44)
AST: 84 U/L — ABNORMAL HIGH (ref 15–41)
Albumin: 1.5 g/dL — ABNORMAL LOW (ref 3.5–5.0)
Alkaline Phosphatase: 158 U/L — ABNORMAL HIGH (ref 38–126)
Anion gap: 16 — ABNORMAL HIGH (ref 5–15)
BUN: 102 mg/dL — ABNORMAL HIGH (ref 6–20)
CO2: 26 mmol/L (ref 22–32)
Calcium: 9.1 mg/dL (ref 8.9–10.3)
Chloride: 109 mmol/L (ref 98–111)
Creatinine, Ser: 2.59 mg/dL — ABNORMAL HIGH (ref 0.61–1.24)
GFR, Estimated: 31 mL/min — ABNORMAL LOW (ref >60)
Glucose, Bld: 124 mg/dL — ABNORMAL HIGH (ref 70–99)
Potassium: 4.1 mmol/L (ref 3.5–5.1)
Sodium: 151 mmol/L — ABNORMAL HIGH (ref 135–145)
Total Bilirubin: 0.8 mg/dL (ref 0.3–1.2)
Total Protein: 6 g/dL — ABNORMAL LOW (ref 6.5–8.1)

## 2020-02-03 LAB — GLUCOSE, CAPILLARY
Glucose-Capillary: 101 mg/dL — ABNORMAL HIGH (ref 70–99)
Glucose-Capillary: 222 mg/dL — ABNORMAL HIGH (ref 70–99)
Glucose-Capillary: 200 mg/dL — ABNORMAL HIGH (ref 70–99)
Glucose-Capillary: 179 mg/dL — ABNORMAL HIGH (ref 70–99)
Glucose-Capillary: 135 mg/dL — ABNORMAL HIGH (ref 70–99)
Glucose-Capillary: 138 mg/dL — ABNORMAL HIGH (ref 70–99)

## 2020-02-03 LAB — CBC
HCT: 31.3 % — ABNORMAL LOW (ref 39.0–52.0)
Hemoglobin: 10.6 g/dL — ABNORMAL LOW (ref 13.0–17.0)
MCH: 31.5 pg (ref 26.0–34.0)
MCHC: 33.9 g/dL (ref 30.0–36.0)
MCV: 93.2 fL (ref 80.0–100.0)
Platelets: 254 10*3/uL (ref 150–400)
RBC: 3.36 MIL/uL — ABNORMAL LOW (ref 4.22–5.81)
RDW: 15.8 % — ABNORMAL HIGH (ref 11.5–15.5)
WBC: 29.7 10*3/uL — ABNORMAL HIGH (ref 4.0–10.5)
nRBC: 0.6 % — ABNORMAL HIGH (ref 0.0–0.2)

## 2020-02-03 MED ORDER — FREE WATER
200.0000 mL | Status: DC
  Administered 2020-02-03 – 2020-02-07 (×49): 200 mL

## 2020-02-03 MED ORDER — QUETIAPINE FUMARATE 50 MG PO TABS
50.0000 mg | ORAL_TABLET | Freq: Two times a day (BID) | ORAL | Status: DC
  Administered 2020-02-03 – 2020-02-04 (×3): 50 mg
  Filled 2020-02-03 (×3): qty 1

## 2020-02-03 MED ORDER — INSULIN ASPART 100 UNIT/ML ~~LOC~~ SOLN
5.0000 [IU] | SUBCUTANEOUS | Status: DC
  Administered 2020-02-03 – 2020-02-09 (×36): 5 [IU] via SUBCUTANEOUS

## 2020-02-03 MED ORDER — QUETIAPINE FUMARATE 50 MG PO TABS
50.0000 mg | ORAL_TABLET | Freq: Two times a day (BID) | ORAL | Status: DC
  Administered 2020-02-03: 50 mg via ORAL
  Filled 2020-02-03: qty 1

## 2020-02-03 NOTE — Progress Notes (Signed)
RT Note  ETT adapter in vent circuit continues to disconnect. ETT resecured. RN and CCM aware.

## 2020-02-03 NOTE — Progress Notes (Signed)
Nutrition Follow-up  DOCUMENTATION CODES:   Not applicable  INTERVENTION:   Continue TF via OG tube: Vital 1.5 at 60 ml/h (1440 ml per day) Prosource TF 45 ml QID Provides 2320 kcal, 141 gm protein, 1100 ml free water daily.  Free water flushes 200 ml every 2 hours for a total of 3.5 L free water per day.  NUTRITION DIAGNOSIS:   Increased nutrient needs related to acute illness (pancreatitis) as evidenced by estimated needs.  Ongoing   GOAL:   Patient will meet greater than or equal to 90% of their needs  Progressing   MONITOR:   Vent status, Labs, Weight trends, TF tolerance, I & O's  REASON FOR ASSESSMENT:   Consult Enteral/tube feeding initiation and management (trickle tube feeds)  ASSESSMENT:   44 yo male admitted with AMS, new onset DM with DKA. PMH includes MDD, paranoid schizophrenia, HTN, smoker, drug use (cocaine, marijuana), heavy alcohol use.  Discussed patient in ICU rounds and with RN today. Currently receiving Vital 1.5 via OGT at 60 ml/h with Prosource TF 45 ml QID. Free water flushes 200 ml every 2 hours. Tolerating well.   Patient remains intubated on ventilator support MV: 10.4 L/min Temp (24hrs), Avg:98.9 F (37.2 C), Min:98 F (36.7 C), Max:100 F (37.8 C)   Labs reviewed. Sodium 151, BUN 102, Creat 2.59 CBG: 116-101-222  Medications reviewed and include colace, novolog, levemir, miralax, precedex.  Weight 107.1 kg today  I/O +10.6 L since admission  Diet Order:   Diet Order            Diet NPO time specified  Diet effective now                 EDUCATION NEEDS:   Not appropriate for education at this time  Skin:  Skin Assessment: Reviewed RN Assessment  Last BM:  12/7 rectal tube  Height:   Ht Readings from Last 1 Encounters:  01/25/20 5\' 10"  (1.778 m)    Weight:   Wt Readings from Last 1 Encounters:  02/03/20 107.1 kg    Ideal Body Weight:  75.5 kg  BMI:  Body mass index is 33.88 kg/m.  Estimated  Nutritional Needs:   Kcal:  2275  Protein:  135-160 gm  Fluid:  2.5 L    2276, RD, LDN, CNSC Please refer to Amion for contact information.

## 2020-02-03 NOTE — Progress Notes (Addendum)
NAME:  Bryan [name], MRN:  [id number], DOB:  [birth year], LOS: 9 ADMISSION DATE:  01/25/2020, CONSULTATION DATE:  01/25/20 REFERRING MD:  Claudette LawsWatson CHIEF COMPLAINT:  AMS    Brief History   Bryan [name] is a 44 y.o. male admitted w/ DKA on 11/29.  PCCM consulted 11/30 for agitation and borderline hypotension. Patient later had PEA cardiac arrest, 2 rounds of CPR when ROSC achieved, patient intubated. Transferred to ICU, CVL and arterial line placed.   CT Head is unremarkable except for right sinus disease. CT Chest/Abdomen/Pelvis showed evidence of moderate acute pancreatitis, mild ascites and small bilateral pleural effusions. Lipase of 315U/L on 01/26/20. No initial clear etiology for pancreatitis, mother denies patient using alcohol, CT a/p negative for dilated biliary ducts or gall stones. Triglycerides found to be 600.    Past Medical History   Past Medical History:  Diagnosis Date  . Hypertension   . Psychiatric problem    [facility]. Takes seroquel. History of hearing voices.  Not commandivng voices.   . Schizophrenia (HCC)   . Tremors of nervous system     Significant Hospital Events   11/29 - admitted 11/30 - PEA Arrest and transfer to ICU  Consults:  PCCM  Procedures:  11/30 ETT 11/30 A line, removed 01/28/20 11/30 Left IJ CVL 11/30 NG tube placed 12/7 Left IJ CVL removed 12/8 Foley Catheter removed  Significant Diagnostic Tests:  CXR 11/29 - neg.  CT Head 12/1 - right sinus disease, no acute intracranial pathology CT Chest/Abd/Pel 12/1 -moderate pancreatis, mild asicites. Small bilateral pleural effusions Abd x-ray 12/1 - NG tube tip and side port in proximal stomach. No bowel obstruction or free air CXR 12/1 - No pneumothorax, developing pneumonia left lower lobe. Mild bibasilar atelectasis. RUQ US 12/1 - GB sludge, negative acute cholecystitis. Fatty liver. Small ascites/partially visualized right pleural effusion  TTE Echo 12/1 -  Gr I diastolic dysfunction LE US 12/1 - No evidence DVT in either LE  CXR 01/29/20 - Lines and tubes in stable position. New infiltrate right lung base. Persistent bibasilar atelectasis.   LUE US 01/29/20 - No evidence of DVT  EEG 12/4>>severe diffuse encephalopathy  MRI Brain 12/5>>No acute findings   Micro Data:  11/30 Bld Cx - no growth  11/30 MRSA - negative  Antimicrobials:  01/26/2020 cefepime  01/26/2020 - d/c'd 12/1 vancomycin 02/02/2020 - DC cefepime. Start vancomycin/meropenem  Interim history/subjective:  Oxycodone and Klonopin started yesterday. Patient continues to be desynchronous with vent when decreasing amounts of sedatives. Continues to have urinary output.   Objective   Blood pressure 121/82, pulse (!) 102, temperature 98.8 F (37.1 C), temperature source Oral, resp. rate (!) 25, height 5\' 10"  (1.778 m), weight 107.1 kg, SpO2 95 %.    Vent Mode: CPAP;PSV FiO2 (%):  [40 %] 40 % Set Rate:  [18 bmp] 18 bmp Vt Set:  [510 mL] 510 mL PEEP:  [5 cmH20] 5 cmH20 Pressure Support:  [5 cmH20] 5 cmH20 Plateau Pressure:  [10 cmH20-15 cmH20] 15 cmH20   Intake/Output Summary (Last 24 hours) at 02/03/2020 0843 Last data filed at 02/03/2020 0809 Gross per 24 hour  Intake 4055.45 ml  Output 4906 ml  Net -850.55 ml   Filed Weights   02/01/20 0458 02/02/20 0500 02/03/20 0400  Weight: 107.9 kg 107.7 kg 107.1 kg    Examination: General: Critically ill-appearing, on vent support HENT: Mucous membranes moist.  Lungs: Bilateral mechanically ventilated breath sounds, no wheezing or rhonchi Cardiovascular:  Regular rhythm, S1-S2 Abdomen: Distension of the abdomen improved. Less firm. Patient contracting abd muscles. Bowel sounds present Extremities: RUE edema has improved. LUE continuous to be edematous. Bilateral lower extremities SCD's in place GU: Foley catheter, yellow urine in bag. White/green discharge head of penis near opening of urethra.  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 Echo and LE dopplers do not support PE. Patient continues to be encephalopathic despite improvement of pancreatitis. Sedatives contributing to this, but infection may be contributing as well. White count trending up with intermittent fevers thought to be secondary to potential line infection. Current abx regimen of vanc and meropenem.  Plan: -off pressors and hemodynamically stable, no fevers documented overnight. Leukocytosis without change.  -negative BC from 01/26/20.  -continue vancomycin and meropenem  Leukocytosis Fever WBC has stayed the same over the past 24 hours, 29.4 > 29.7. Suspect infectious etiology at this time. Pancreatitis improving, do not suspect recurrence. Consolidation's seen on x-ray imaging are improving, no change in pulmonary examination. Patient had central line that staff had difficult time using, area is erythematous and patient had swollen LUE. US performed, negative for clot. Removing foley catheter today, patient with discharge at tip of penis and urinating well. Changed to broad spectrum antibiotics yesterday.  -trend WBC -continue vancomycin and meropenem   Severe Pancreatitis  Unclear etiology, Tg elevated upon admission. IgG negative. Mother denies alcohol and drug use. Imaging negative for biliary stricture or cholestasis. CT a/p yesterday no pancreatic hemorrhage, mass, or fluid collection seen. Decrease in abdominal edema. Bowel sounds present Plan: -less firm on abd exam today, bowel sounds present. Do not suspect increase in leukocytosis is due to worsening pancreatitis -continue supportive care -tube be feeds continued, -lactic acid normal two days ago   Diabetic Ketoacidosis, likely secondary to pancreatic inflammation New onset in setting of pancreatitis. Improving,  off of insulin drips. Tube feeds in context of intubated state.  Plan: -Titrate SSI for goal CBG 140-180.  Acute Hypoxemic Respiratory Failure In setting of cardiac arrest, shock and pancreatitis Patient continues to desynchronous with vent when attempting to wean off of sedatives. Was put back on versed because Oxycodone and clonazepam started yesterday. Plan: -continue oxycodone and clonazepam. Wean versed and fentanyl as tolerable -hold diuretics -maintain full vent support with SAT/SBT as tolerated -titrate Vent setting to maintain SpO2 greater than or equal to 90%. -HOB elevated 30 degrees. -Plateau pressures less than 30 cm H20.  -Bronchial hygiene and RT/bronchodilator protocol.  Hx of Schizophrenia Patient followed by [facility]. Current outpatient regimen of abilify monthly. Mother states patient missed November dosage. Will start dosing with Seroquel. He was on this medication in the past and it was discontinued as patient was noncompliant with orals and preferred long acting agent.  - Start Seroquel.   Right Lower Opacity, bilateral infiltrates Last CXR with improvement of left basilar aeration Plan: -continue vancomycin and meropenem   Acute Kidney Injury Hypernatremia Hypokalemia Creatinine improving. Sodium up 146>151. K of 4.1. Removing foley catheter. Diuretics d/c'd yesterday.  Plan: -continue to hold diuretics. Patient urinating well without them/ -follow urine output, electrolytes -plan replace as needed.  Volume Overload Status Patient urinating well off of diuretics. Foley to be removed today, will put back in if patient has decreased urinary output. Suspect needed foley initially due to increased abdominal pressure from pancreatitis. Output has improved since pancreatitis has  improved. Plan: -Removing foley -monitor I/O if decrease, will put back in foley  Best practice:  Diet: TF Pain/Anxiety/Delirium protocol (if indicated): Versed. Oxycodone.  Clonazepam VAP protocol (if indicated): Ordered DVT prophylaxis: SCD's / Lovenox. GI prophylaxis: PPI Glucose control: Standard glycemic control Mobility: Bedrest. Last date of multidisciplinary goals of care discussion: Goals of care discussion 02/02/2020 with mother Family and staff present: Nurse and doctor Summary of discussion: Patient is full code. Continue aggressive care measures. Follow up goals of care discussion due: 12/10 Code Status: Full Family Communication: Mother updated 12/7 Disposition: ICU  Labs   CBC: Recent Labs  Lab 01/30/20 0352 01/30/20 0352 01/31/20 0306 02/01/20 0403 02/01/20 1101 02/02/20 0535 02/03/20 0605  WBC 15.9*  --  19.9* 27.7*  --  29.4* 29.7*  NEUTROABS  --   --  13.6*  --   --   --   --   HGB 10.9*   < > 11.6* 11.3* 10.2* 10.0* 10.6*  HCT 32.2*   < > 35.8* 33.7* 30.0* 31.0* 31.3*  MCV 92.8  --  93.0 90.6  --  93.9 93.2  PLT 155  --  190 222  --  226 254   < > = values in this interval not displayed.    Basic Metabolic Panel: Recent Labs  Lab 01/28/20 1714 01/28/20 2354 01/29/20 0253 01/29/20 0253 01/29/20 1706 01/30/20 0352 01/30/20 1614 01/30/20 2006 01/31/20 0306 01/31/20 0306 02/01/20 0403 02/01/20 1101 02/01/20 2125 02/02/20 0535 02/03/20 0047  NA  --    < > 147*   < >  --  148*   < >  --  146*   < > 147* 149* 144 146* 151*  K  --    < > 3.7   < >  --  3.1*   < > 3.4* 3.1*   < > 3.6 2.8* 3.3* 3.6 4.1  CL  --   --  111   < >  --  109   < >  --  105  --  104  --  104 103 109  CO2  --   --  24   < >  --  25   < >  --  26  --  27  --  27 29 26   GLUCOSE  --   --  189*   < >  --  242*   < >  --  240*  --  135*  --  327* 370* 124*  BUN  --   --  49*   < >  --  47*   < >  --  54*  --  70*  --  87* 99* 102*  CREATININE  --   --  1.48*   < >  --  1.35*   < >  --  1.45*  --  2.16*  --  2.52* 2.75* 2.59*  CALCIUM  --   --  8.4*   < >  --  9.1   < >  --  9.4  --  9.1  --  8.6* 9.0 9.1  MG 2.2  --  2.4  --  2.3 2.1  --  2.1 2.1   --   --   --   --   --   --   PHOS 1.6*  --  1.9*  --  2.3* 2.5  --   --  3.3  --   --   --   --   --   --    < > =  values in this interval not displayed.   GFR: Estimated Creatinine Clearance: 45 mL/min (A) (by C-G formula based on SCr of 2.59 mg/dL (H)). Recent Labs  Lab 01/31/20 0306 02/01/20 0403 02/01/20 0819 02/02/20 0535 02/03/20 0605  WBC 19.9* 27.7*  --  29.4* 29.7*  LATICACIDVEN  --   --  1.4  --   --    Liver Function Tests: Recent Labs  Lab 01/28/20 1435 01/30/20 1614 01/31/20 0306 02/02/20 0535 02/03/20 0047  AST 47* 94* 88* 55* 84*  ALT 34 70* 76* 58* 65*  ALKPHOS 75 201* 252* 197* 158*  BILITOT 1.0 0.9 0.8 0.6 0.8  PROT 4.6* 6.2* 6.4* 6.3* 6.0*  ALBUMIN 1.6* 1.8* 1.9* 1.7* 1.5*   Recent Labs  Lab 01/29/20 0253  LIPASE 24   Recent Labs  Lab 01/30/20 1603  AMMONIA 27   ABG    Component Value Date/Time   PHART 7.491 (H) 02/01/2020 1101   PCO2ART 41.6 02/01/2020 1101   PO2ART 53 (L) 02/01/2020 1101   HCO3 31.5 (H) 02/01/2020 1101   TCO2 33 (H) 02/01/2020 1101   ACIDBASEDEF 5.0 (H) 01/26/2020 2111   O2SAT 88.0 02/01/2020 1101     Coagulation Profile: No results for input(s): INR, PROTIME in the last 168 hours.  Cardiac Enzymes: Recent Labs  Lab 02/02/20 1401  CKTOTAL 524*    HbA1C: Hgb A1c MFr Bld  Date/Time Value Ref Range Status  01/25/2020 05:40 PM 13.4 (H) 4.8 - 5.6 % Final    Comment:    (NOTE) Pre diabetes:          5.7%-6.4%  Diabetes:              >6.4%  Glycemic control for   <7.0% adults with diabetes     CBG: Recent Labs  Lab 02/02/20 1908 02/02/20 2127 02/02/20 2301 02/03/20 0302 02/03/20 0705  GLUCAP 90 98 116* 101* 222*   Vasili Katsadouros Internal Medicine Resident PGY-1 [place]     PCCM:  44 yo M, acute pancreatitis, intubated on vent, fevers, cultures negative   BP 113/77   Pulse (!) 115   Temp 99.3 F (37.4 C) (Oral)   Resp 18   Ht 5\' 10"  (1.778 m)   Wt 107.1 kg   SpO2 96%   BMI  33.88 kg/m   Gen: middle aged male, resting in bed, on mechanical vent  HENT: ETT in place, left IJ site dressed  Lungs: BL mechanically vented breaths  Heart: RRR, s1 s2  Abd: mildly distended   Labs reviewed: WBC mild increase   A:  Acute pancreatitis  Ongoing fevers and leukocytosis Unclear source of fever, CVL removed yesterday, foley removed today, sputum culture pending  AHRF on MV   P: Weaning from sedation  Added seroquel  Continue abx  Foley removed today  Holding diuresis, follow UOP closely He may need diuretics tomorrow with +CFB   This patient is critically ill with multiple organ system failure; which, requires frequent high complexity decision making, assessment, support, evaluation, and titration of therapies. This was completed through the application of advanced monitoring technologies and extensive interpretation of multiple databases. During this encounter critical care time was devoted to patient care services described in this note for 32 minutes.  , DO [place] [facility] 02/03/2020 1:07 PM

## 2020-02-04 ENCOUNTER — Inpatient Hospital Stay (HOSPITAL_COMMUNITY): Payer: Medicaid Other

## 2020-02-04 LAB — CBC
HCT: 30.2 % — ABNORMAL LOW (ref 39.0–52.0)
Hemoglobin: 10 g/dL — ABNORMAL LOW (ref 13.0–17.0)
MCH: 31.1 pg (ref 26.0–34.0)
MCHC: 33.1 g/dL (ref 30.0–36.0)
MCV: 93.8 fL (ref 80.0–100.0)
Platelets: 235 10*3/uL (ref 150–400)
RBC: 3.22 MIL/uL — ABNORMAL LOW (ref 4.22–5.81)
RDW: 15.8 % — ABNORMAL HIGH (ref 11.5–15.5)
WBC: 30.1 10*3/uL — ABNORMAL HIGH (ref 4.0–10.5)
nRBC: 1.4 % — ABNORMAL HIGH (ref 0.0–0.2)

## 2020-02-04 LAB — COMPREHENSIVE METABOLIC PANEL
ALT: 78 U/L — ABNORMAL HIGH (ref 0–44)
AST: 86 U/L — ABNORMAL HIGH (ref 15–41)
Albumin: 1.6 g/dL — ABNORMAL LOW (ref 3.5–5.0)
Alkaline Phosphatase: 204 U/L — ABNORMAL HIGH (ref 38–126)
Anion gap: 15 (ref 5–15)
BUN: 90 mg/dL — ABNORMAL HIGH (ref 6–20)
CO2: 25 mmol/L (ref 22–32)
Calcium: 9.3 mg/dL (ref 8.9–10.3)
Chloride: 108 mmol/L (ref 98–111)
Creatinine, Ser: 2.3 mg/dL — ABNORMAL HIGH (ref 0.61–1.24)
GFR, Estimated: 35 mL/min — ABNORMAL LOW (ref >60)
Glucose, Bld: 159 mg/dL — ABNORMAL HIGH (ref 70–99)
Potassium: 4 mmol/L (ref 3.5–5.1)
Sodium: 148 mmol/L — ABNORMAL HIGH (ref 135–145)
Total Bilirubin: 0.5 mg/dL (ref 0.3–1.2)
Total Protein: 6.2 g/dL — ABNORMAL LOW (ref 6.5–8.1)

## 2020-02-04 LAB — GLUCOSE, CAPILLARY
Glucose-Capillary: 130 mg/dL — ABNORMAL HIGH (ref 70–99)
Glucose-Capillary: 144 mg/dL — ABNORMAL HIGH (ref 70–99)
Glucose-Capillary: 218 mg/dL — ABNORMAL HIGH (ref 70–99)
Glucose-Capillary: 96 mg/dL (ref 70–99)
Glucose-Capillary: 185 mg/dL — ABNORMAL HIGH (ref 70–99)
Glucose-Capillary: 157 mg/dL — ABNORMAL HIGH (ref 70–99)

## 2020-02-04 LAB — CULTURE, RESPIRATORY W GRAM STAIN: Culture: NORMAL

## 2020-02-04 MED ORDER — OXYCODONE HCL 5 MG/5ML PO SOLN
10.0000 mg | Freq: Four times a day (QID) | ORAL | Status: DC
  Administered 2020-02-04 – 2020-02-06 (×8): 10 mg
  Filled 2020-02-04 (×8): qty 10

## 2020-02-04 MED ORDER — FUROSEMIDE 10 MG/ML IJ SOLN
80.0000 mg | Freq: Once | INTRAMUSCULAR | Status: AC
  Administered 2020-02-04: 80 mg via INTRAVENOUS
  Filled 2020-02-04: qty 8

## 2020-02-04 NOTE — Progress Notes (Addendum)
NAME:  Elin Seats, MRN:  [id number], DOB:  September 13, 1975, LOS: 10 ADMISSION DATE:  01/25/2020, CONSULTATION DATE:  01/25/20 REFERRING MD:  Claudette Laws CHIEF COMPLAINT:  AMS    Brief History   Braedan Meuth is a 44 y.o. male admitted w/ DKA on 11/29.  PCCM consulted 11/30 for agitation and borderline hypotension. Patient later had PEA cardiac arrest, 2 rounds of CPR when ROSC achieved, patient intubated. Transferred to ICU, CVL and arterial line placed.   CT Head is unremarkable except for right sinus disease. CT Chest/Abdomen/Pelvis showed evidence of moderate acute pancreatitis, mild ascites and small bilateral pleural effusions. Lipase of 315U/L on 01/26/20. No initial clear etiology for pancreatitis, mother denies patient using alcohol, CT a/p negative for dilated biliary ducts or gall stones. Triglycerides found to be 600.    Past Medical History   Past Medical History:  Diagnosis Date  . Hypertension   . Psychiatric problem    Seen at [facility] for unknown reason. Takes seroquel. History of hearing voices.  Not commandivng voices.   . Schizophrenia (HCC)   . Tremors of nervous system     Significant Hospital Events   11/29 - admitted 11/30 - PEA Arrest and transfer to ICU  Consults:  PCCM  Procedures:  11/30 ETT 11/30 A line, removed 01/28/20 11/30 Left IJ CVL 11/30 NG tube placed 12/7 Left IJ CVL removed 12/8 Foley Catheter removed  Significant Diagnostic Tests:  CXR 11/29 - neg.  CT Head 12/1 - right sinus disease, no acute intracranial pathology CT Chest/Abd/Pel 12/1 -moderate pancreatis, mild asicites. Small bilateral pleural effusions Abd x-ray 12/1 - NG tube tip and side port in proximal stomach. No bowel obstruction or free air CXR 12/1 - No pneumothorax, developing pneumonia left lower lobe. Mild bibasilar atelectasis. RUQ [place] 12/1 - GB sludge, negative acute cholecystitis. Fatty liver. Small ascites/partially visualized right pleural effusion  TTE Echo 12/1 -  Gr I diastolic dysfunction LE [place] 12/1 - No evidence DVT in either LE  CXR 01/29/20 - Lines and tubes in stable position. New infiltrate right lung base. Persistent bibasilar atelectasis.   LUE [place] 01/29/20 - No evidence of DVT  EEG 12/4>>severe diffuse encephalopathy  MRI Brain 12/5>>No acute findings   Micro Data:  11/30 Bld Cx - no growth  11/30 MRSA - negative 12/07 Trache Aspirate Cx - Abundant WBC, predom PMN. No organism. Culture reincubated  Antimicrobials:  01/26/2020 cefepime  01/26/2020 - d/c'd 12/1 vancomycin 02/02/2020 - DC cefepime. Vancomycin/meropenem started  Interim history/subjective:  Overnight nursing reported patient is continuing to be desynchronous with his vent.   Objective   Blood pressure 97/61, pulse 69, temperature 99 F (37.2 C), temperature source Oral, resp. rate 14, height 5\' 10"  (1.778 m), weight 105.1 kg, SpO2 98 %.    Vent Mode: PRVC FiO2 (%):  [40 %] 40 % Set Rate:  [16 bmp-18 bmp] 18 bmp Vt Set:  [510 mL] 510 mL PEEP:  [5 cmH20] 5 cmH20 Pressure Support:  [5 cmH20] 5 cmH20 Plateau Pressure:  [12 cmH20-18 cmH20] 16 cmH20   Intake/Output Summary (Last 24 hours) at 02/04/2020 0743 Last data filed at 02/04/2020 0700 Gross per 24 hour  Intake 4737.88 ml  Output 3630 ml  Net 1107.88 ml   Filed Weights   02/02/20 0500 02/03/20 0400 02/04/20 0500  Weight: 107.7 kg 107.1 kg 105.1 kg    Examination: General: Critically ill-appearing, on vent support HENT: Mucous membranes moist.  Lungs: Bilateral mechanically ventilated breath sounds, no wheezing or  rhonchi Cardiovascular: Regular rhythm, S1-S2 Abdomen: Distension had no improvement from yesterday.  Bowel sounds present Extremities: BLE no lower extremity edema GU: Condom cath in place. Yellow urine in bedside bag.  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 Echo and LE dopplers do not support PE. Patient continues to be encephalopathic despite improvement of pancreatitis. Sedatives contributing to this, but infection may be contributing as well. White count trending upward Current abx regimen of vanc and meropenem.  Plan: -off pressors and hemodynamically stable. Leukocytosis without change.  -negative BC from 01/26/20. -continue vancomycin and meropenem  Leukocytosis Fever WBC 29.7>30. Suspect infectious etiology at this time. Pancreatitis improving, do not suspect recurrence. Consolidation's seen on x-ray imaging are improving, no change in pulmonary examination. Patient had central line that staff had difficult time using, area is erythematous and patient had swollen LUE. US performed, negative for clot. Removing foley catheter today, patient with discharge at tip of penis and urinating well. Changed to meropenem and vanc 02/02/20 - repeat BC today. Left CV removed 02/02/20 -trend WBC -continue vancomycin and meropenem   Severe Pancreatitis  Unclear etiology, Tg elevated upon admission. IgG negative. Mother denies alcohol and drug use. Imaging negative for biliary stricture or cholestasis. CT a/p 12/06 no pancreatic hemorrhage, mass, or fluid collection seen. Abdomen with active bowel sounds Plan: -abdominal exam continues to improve. Bowel sounds present -continue supportive care -tube be feeds continued   Diabetic Ketoacidosis, likely secondary to pancreatic inflammation New onset in setting of pancreatitis. Off of insulin drip. Tube feeds in context of intubated state.  Plan: -Titrate SSI for goal CBG 140-180.  Acute Hypoxemic Respiratory Failure In setting of cardiac arrest, shock and pancreatitis Patient continues to desynchronous with vent. Suspect this is secondary to pain as when weaning, patient appears uncomfortable Plan: -Increase oxycodone and continue clonazepam. Wean versed and fentanyl  as tolerable -maintain full vent support with SAT/SBT as tolerated -titrate Vent setting to maintain SpO2 greater than or equal to 90%. -HOB elevated 30 degrees. -Plateau pressures less than 30 cm H20.  -Bronchial hygiene and RT/bronchodilator protocol.  Hx of Schizophrenia Patient followed by [facility]. Current outpatient regimen of abilify monthly. Mother states patient missed November dosage. Will start dosing with Seroquel. He was on this medication in the past and it was discontinued as patient was noncompliant with orals and preferred long acting agent.  - Seroquel started 02/03/20  Right Lower Opacity, bilateral infiltrates Last CXR with improvement of left basilar aeration Plan: -continue vancomycin and meropenem   Acute Kidney Injury Hypernatremia Hypokalemia Creatinine improving. Sodium 151>148. K of 4.0.  Plan: -follow urine output, electrolytes -replace as needed.  Volume Overload Status Patient urinating well off of diuretics. Foley removed yesterday, patient continues to have good urinary output. Suspect needed foley initially due to increased abdominal pressure from pancreatitis.  Plan: -monitor I/O -diurese as needed  Best practice:  Diet: TF Pain/Anxiety/Delirium protocol (if indicated): Versed. Oxycodone. Clonazepam VAP protocol (if indicated): Ordered DVT prophylaxis: SCD's / Lovenox. GI prophylaxis: PPI Glucose control: Standard glycemic control Mobility: Bedrest. Last date of multidisciplinary goals of care discussion: Goals of care discussion 02/02/2020 with mother Family and staff present: Nurse and doctor Summary of discussion: Patient is full code. Continue aggressive care measures. Follow up goals of care discussion due: 12/10 Code Status: Full Family Communication: Mother updated 12/7 Disposition: ICU  Labs  CBC: Recent Labs  Lab 01/31/20 0306 02/01/20 0403 02/01/20 1101 02/02/20 0535 02/03/20 0605 02/04/20 0155  WBC 19.9* 27.7*  --   29.4* 29.7* 30.1*  NEUTROABS 13.6*  --   --   --   --   --   HGB 11.6* 11.3* 10.2* 10.0* 10.6* 10.0*  HCT 35.8* 33.7* 30.0* 31.0* 31.3* 30.2*  MCV 93.0 90.6  --  93.9 93.2 93.8  PLT 190 222  --  226 254 235    Basic Metabolic Panel: Recent Labs  Lab 01/28/20 1714 01/28/20 2354 01/29/20 0253 01/29/20 1706 01/30/20 0352 01/30/20 1614 01/30/20 2006 01/31/20 0306 02/01/20 0403 02/01/20 1101 02/01/20 2125 02/02/20 0535 02/03/20 0047 02/04/20 0155  NA  --    < > 147*  --  148*   < >  --  146* 147* 149* 144 146* 151* 148*  K  --    < > 3.7  --  3.1*   < > 3.4* 3.1* 3.6 2.8* 3.3* 3.6 4.1 4.0  CL  --   --  111  --  109   < >  --  105 104  --  104 103 109 108  CO2  --   --  24  --  25   < >  --  26 27  --  27 29 26 25   GLUCOSE  --   --  189*  --  242*   < >  --  240* 135*  --  327* 370* 124* 159*  BUN  --   --  49*  --  47*   < >  --  54* 70*  --  87* 99* 102* 90*  CREATININE  --   --  1.48*  --  1.35*   < >  --  1.45* 2.16*  --  2.52* 2.75* 2.59* 2.30*  CALCIUM  --   --  8.4*  --  9.1   < >  --  9.4 9.1  --  8.6* 9.0 9.1 9.3  MG 2.2  --  2.4 2.3 2.1  --  2.1 2.1  --   --   --   --   --   --   PHOS 1.6*  --  1.9* 2.3* 2.5  --   --  3.3  --   --   --   --   --   --    < > = values in this interval not displayed.   GFR: Estimated Creatinine Clearance: 50.3 mL/min (A) (by C-G formula based on SCr of 2.3 mg/dL (H)). Recent Labs  Lab 02/01/20 0403 02/01/20 0819 02/02/20 0535 02/03/20 0605 02/04/20 0155  WBC 27.7*  --  29.4* 29.7* 30.1*  LATICACIDVEN  --  1.4  --   --   --    Liver Function Tests: Recent Labs  Lab 01/30/20 1614 01/31/20 0306 02/02/20 0535 02/03/20 0047 02/04/20 0155  AST 94* 88* 55* 84* 86*  ALT 70* 76* 58* 65* 78*  ALKPHOS 201* 252* 197* 158* 204*  BILITOT 0.9 0.8 0.6 0.8 0.5  PROT 6.2* 6.4* 6.3* 6.0* 6.2*  ALBUMIN 1.8* 1.9* 1.7* 1.5* 1.6*   Recent Labs  Lab 01/29/20 0253  LIPASE 24   Recent Labs  Lab 01/30/20 1603  AMMONIA 27   ABG     Component Value Date/Time   PHART 7.491 (H) 02/01/2020 1101   PCO2ART 41.6 02/01/2020 1101   PO2ART 53 (L) 02/01/2020 1101   HCO3 31.5 (H)  02/01/2020 1101   TCO2 33 (H) 02/01/2020 1101   ACIDBASEDEF 5.0 (H) 01/26/2020 2111   O2SAT 88.0 02/01/2020 1101     Coagulation Profile: No results for input(s): INR, PROTIME in the last 168 hours.  Cardiac Enzymes: Recent Labs  Lab 02/02/20 1401  CKTOTAL 524*    HbA1C: Hgb A1c MFr Bld  Date/Time Value Ref Range Status  01/25/2020 05:40 PM 13.4 (H) 4.8 - 5.6 % Final    Comment:    (NOTE) Pre diabetes:          5.7%-6.4%  Diabetes:              >6.4%  Glycemic control for   <7.0% adults with diabetes     CBG: Recent Labs  Lab 02/03/20 1514 02/03/20 1912 02/03/20 2310 02/04/20 0304 02/04/20 0718  GLUCAP 179* 135* 138* 130* 144*   Vasili Katsadouros Internal Medicine Resident PGY-1 [place]    Pulmonary critical care attending:  This is a 44 year old male seen in conjunction with medical resident documentation as above.  44 year old, admitted for DKA, acute pancreatitis, suffered PEA cardiac arrest, 2 rounds CPR, ROSC, was on vasopressors in shock briefly. EEG normal with diffuse encephalopathy, no seizure, MRI brain within normal limits.  Overall source of pancreatitis not found. Now with increasing white count and fevers. Central line removed and Foley removed. On broad-spectrum antibiotics.  BP 97/61   Pulse 69   Temp 99 F (37.2 C) (Oral)   Resp 14   Ht 5\' 10"  (1.778 m)   Wt 105.1 kg   SpO2 98%   BMI 33.25 kg/m   General: Young male intubated on mechanical life support. HEENT endotracheal tube in place Heart: Regular rhythm S1-S2 Lungs: Bilateral mechanically ventilated breath sounds Abdomen: Slightly more distended today.  Labs: Reviewed White count still increasing.  Respiratory culture negative no growth to date.  Assessment: Acute hypoxemic respiratory failure requiring intubation mechanical  ventilation Acute pancreatitis, sepsis, cultures negative from respiratory tract. Central venous line removed, Foley removed. PEA cardiac arrest, acute metabolic encephalopathy, possible anoxic encephalopathy however MRI looked reassuring. DKA hyperglycemia resolved. Psychiatric history on injection aripiprazole  Plan: Continue antibiotics Currently on vancomycin and meropenem, treat empirically x7 days. Continue to wean from sedation. He appears more comfortable. Started Seroquel yesterday, may need to uptitrate. Attempt to remove from continuous sedation and leave on Precedex alone. Uptitrate orals. Follow urine output.  This patient is critically ill with multiple organ system failure; which, requires frequent high complexity decision making, assessment, support, evaluation, and titration of therapies. This was completed through the application of advanced monitoring technologies and extensive interpretation of multiple databases. During this encounter critical care time was devoted to patient care services described in this note for 32 minutes.  Josephine IgoBradley L Nija Koopman, DO [facility] 02/04/2020 10:19 AM

## 2020-02-04 NOTE — TOC Initial Note (Signed)
Transition of Care [facility]) - Initial/Assessment Note    Patient Details  Name: Bryan [name] MRN: [id number] Date of Birth: 1975-04-18  [facility] CM/SW Contact:    Lockie Pares, RN Phone Number: 02/04/2020, 10:38 AM  Clinical Narrative:                 Patient 44 year old form home DKA with underlying pancreatitis, arrested PEA. Intubated, EEG with slow encephalopathy Lipase elevation, lung consolidation. Treated with ABX, deconditioned, may need rehab/SNF placement post hospitalization. Listed a mother and grandmother as next of kin. CM and CSW will follow for needs as patient progresses.   Expected Discharge Plan: Skilled Nursing Facility Barriers to Discharge: Continued Medical Work up   Patient Goals and CMS Choice        Expected Discharge Plan and Services Expected Discharge Plan: Skilled Nursing Facility In-house Referral: Clinical Social Work Discharge Planning Services: CM Consult   Living arrangements for the past 2 months: Apartment                                      Prior Living Arrangements/Services Living arrangements for the past 2 months: Apartment   Patient language and need for interpreter reviewed:: Yes        Need for Family Participation in Patient Care: Yes (Comment) Care giver support system in place?: Yes (comment)   Criminal Activity/Legal Involvement Pertinent to Current Situation/Hospitalization: No - Comment as needed  Activities of Daily Living   ADL Screening (condition at time of admission) Patient's cognitive ability adequate to safely complete daily activities?: Yes Is the patient deaf or have difficulty hearing?: No Does the patient have difficulty seeing, even when wearing glasses/contacts?: No Does the patient have difficulty concentrating, remembering, or making decisions?: Yes Patient able to express need for assistance with ADLs?: Yes Does the patient have difficulty dressing or bathing?:  No Independently performs ADLs?: Yes (appropriate for developmental age) Does the patient have difficulty walking or climbing stairs?: No Weakness of Legs: None Weakness of Arms/Hands: None  Permission Sought/Granted                  Emotional Assessment   Attitude/Demeanor/Rapport: Intubated (Following Commands or Not Following Commands) Affect (typically observed): Unable to Assess Orientation: : Fluctuating Orientation (Suspected and/or reported Sundowners) Alcohol / Substance Use: Not Applicable Psych Involvement: No (comment)  Admission diagnosis:  Dehydration [E86.0] Leukocytosis [D72.829] DKA (diabetic ketoacidosis) (HCC) [E11.10] AKI (acute kidney injury) (HCC) [N17.9] Diabetic ketoacidosis without coma associated with type 2 diabetes mellitus (HCC) [E11.10] Patient Active Problem List   Diagnosis Date Noted  . Pancreatitis   . Respiratory failure (HCC)   . DKA (diabetic ketoacidosis) (HCC) 01/25/2020  . 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 Paranoid schizophrenia (HCC)   . Undifferentiated schizophrenia (HCC)   . 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 Illiterate 10/30/2011  . Schizoaffective disorder, depressive type (HCC) 10/27/2011  . Schizoaffective disorder (HCC) 08/10/2011  . Observed seizure-like activity (HCC) 08/09/2011  . PAIN IN JOINT, MULTIPLE SITES 02/16/2010  . FATIGUE 02/16/2010  . Shortness of breath 02/16/2010   PCP:  Default, Provider, MD Pharmacy:   [facility] [id number] [place], [place] - [phone] AT [facility] [address] [place] [address] Phone: [phone] Fax: [phone]     Social Determinants of Health (SDOH) Interventions  Readmission Risk Interventions No flowsheet data found.

## 2020-02-05 ENCOUNTER — Inpatient Hospital Stay (HOSPITAL_COMMUNITY): Payer: Medicaid Other

## 2020-02-05 LAB — GLUCOSE, CAPILLARY
Glucose-Capillary: 168 mg/dL — ABNORMAL HIGH (ref 70–99)
Glucose-Capillary: 172 mg/dL — ABNORMAL HIGH (ref 70–99)
Glucose-Capillary: 176 mg/dL — ABNORMAL HIGH (ref 70–99)
Glucose-Capillary: 176 mg/dL — ABNORMAL HIGH (ref 70–99)
Glucose-Capillary: 182 mg/dL — ABNORMAL HIGH (ref 70–99)
Glucose-Capillary: 192 mg/dL — ABNORMAL HIGH (ref 70–99)

## 2020-02-05 LAB — CBC
HCT: 33.3 % — ABNORMAL LOW (ref 39.0–52.0)
Hemoglobin: 10.9 g/dL — ABNORMAL LOW (ref 13.0–17.0)
MCH: 30.7 pg (ref 26.0–34.0)
MCHC: 32.7 g/dL (ref 30.0–36.0)
MCV: 93.8 fL (ref 80.0–100.0)
Platelets: 215 10*3/uL (ref 150–400)
RBC: 3.55 MIL/uL — ABNORMAL LOW (ref 4.22–5.81)
RDW: 15.5 % (ref 11.5–15.5)
WBC: 28.4 10*3/uL — ABNORMAL HIGH (ref 4.0–10.5)
nRBC: 0.4 % — ABNORMAL HIGH (ref 0.0–0.2)

## 2020-02-05 LAB — COMPREHENSIVE METABOLIC PANEL
ALT: 69 U/L — ABNORMAL HIGH (ref 0–44)
AST: 60 U/L — ABNORMAL HIGH (ref 15–41)
Albumin: 1.8 g/dL — ABNORMAL LOW (ref 3.5–5.0)
Alkaline Phosphatase: 179 U/L — ABNORMAL HIGH (ref 38–126)
Anion gap: 14 (ref 5–15)
BUN: 96 mg/dL — ABNORMAL HIGH (ref 6–20)
CO2: 25 mmol/L (ref 22–32)
Calcium: 9.1 mg/dL (ref 8.9–10.3)
Chloride: 108 mmol/L (ref 98–111)
Creatinine, Ser: 2.4 mg/dL — ABNORMAL HIGH (ref 0.61–1.24)
GFR, Estimated: 33 mL/min — ABNORMAL LOW (ref >60)
Glucose, Bld: 167 mg/dL — ABNORMAL HIGH (ref 70–99)
Potassium: 4.4 mmol/L (ref 3.5–5.1)
Sodium: 147 mmol/L — ABNORMAL HIGH (ref 135–145)
Total Bilirubin: 0.5 mg/dL (ref 0.3–1.2)
Total Protein: 6.2 g/dL — ABNORMAL LOW (ref 6.5–8.1)

## 2020-02-05 LAB — MAGNESIUM: Magnesium: 2.2 mg/dL (ref 1.7–2.4)

## 2020-02-05 LAB — PHOSPHORUS: Phosphorus: 5.1 mg/dL — ABNORMAL HIGH (ref 2.5–4.6)

## 2020-02-05 MED ORDER — QUETIAPINE FUMARATE 100 MG PO TABS: 100.0000 mg | ORAL_TABLET | Freq: Two times a day (BID) | ORAL | Status: DC

## 2020-02-05 MED ORDER — QUETIAPINE FUMARATE 100 MG PO TABS
100.0000 mg | ORAL_TABLET | Freq: Two times a day (BID) | ORAL | Status: DC
  Filled 2020-02-05: qty 1

## 2020-02-05 MED ORDER — QUETIAPINE FUMARATE 100 MG PO TABS
100.0000 mg | ORAL_TABLET | Freq: Every morning | ORAL | Status: DC
  Administered 2020-02-06 – 2020-02-09 (×4): 100 mg
  Filled 2020-02-05 (×4): qty 1

## 2020-02-05 MED ORDER — QUETIAPINE FUMARATE 100 MG PO TABS: 200.0000 mg | ORAL_TABLET | Freq: Every day | ORAL | Status: DC

## 2020-02-05 MED ORDER — QUETIAPINE FUMARATE 100 MG PO TABS
200.0000 mg | ORAL_TABLET | Freq: Every day | ORAL | Status: DC
  Administered 2020-02-05 – 2020-02-08 (×4): 200 mg
  Filled 2020-02-05 (×4): qty 2

## 2020-02-05 MED ORDER — POLYETHYLENE GLYCOL 3350 17 G PO PACK
17.0000 g | PACK | Freq: Two times a day (BID) | ORAL | Status: DC
  Administered 2020-02-05 – 2020-02-14 (×13): 17 g
  Filled 2020-02-05 (×13): qty 1

## 2020-02-05 MED ORDER — CLONAZEPAM 0.5 MG PO TBDP
1.0000 mg | ORAL_TABLET | Freq: Two times a day (BID) | ORAL | Status: DC
  Administered 2020-02-05 – 2020-02-06 (×2): 1 mg
  Filled 2020-02-05 (×2): qty 2

## 2020-02-05 MED ORDER — QUETIAPINE FUMARATE 100 MG PO TABS
100.0000 mg | ORAL_TABLET | Freq: Every morning | ORAL | Status: DC
  Administered 2020-02-05: 100 mg via ORAL

## 2020-02-05 NOTE — Progress Notes (Signed)
NAME:  Woodfin Kiss, MRN:  [id number], DOB:  07-Apr-1975, LOS: 11 ADMISSION DATE:  01/25/2020, CONSULTATION DATE:  01/25/20 REFERRING MD:  Claudette Laws CHIEF COMPLAINT:  AMS    Brief History   Prithvi Kooi is a 44 y.o. male admitted w/ DKA on 11/29.  PCCM consulted 11/30 for agitation and borderline hypotension. Patient later had PEA cardiac arrest, 2 rounds of CPR when ROSC achieved, patient intubated. Transferred to ICU, CVL and arterial line placed.   CT Head is unremarkable except for right sinus disease. CT Chest/Abdomen/Pelvis showed evidence of moderate acute pancreatitis, mild ascites and small bilateral pleural effusions. Lipase of 315U/L on 01/26/20. No initial clear etiology for pancreatitis, mother denies patient using alcohol, CT a/p negative for dilated biliary ducts or gall stones. Triglycerides found to be 600.    Past Medical History   Past Medical History:  Diagnosis Date  . Hypertension   . Psychiatric problem    Seen at [facility] for unknown reason. Takes seroquel. History of hearing voices.  Not commandivng voices.   . Schizophrenia (HCC)   . Tremors of nervous system     Significant Hospital Events   11/29 - admitted 11/30 - PEA Arrest and transfer to ICU  Consults:  PCCM  Procedures:  11/30 ETT 11/30 A line, removed 01/28/20 11/30 Left IJ CVL 11/30 NG tube placed 12/7 Left IJ CVL removed 12/8 Foley Catheter removed  Significant Diagnostic Tests:  CXR 11/29 - neg.  CT Head 12/1 - right sinus disease, no acute intracranial pathology CT Chest/Abd/Pel 12/1 -moderate pancreatis, mild asicites. Small bilateral pleural effusions Abd x-ray 12/1 - NG tube tip and side port in proximal stomach. No bowel obstruction or free air CXR 12/1 - No pneumothorax, developing pneumonia left lower lobe. Mild bibasilar atelectasis. RUQ [place] 12/1 - GB sludge, negative acute cholecystitis. Fatty liver. Small ascites/partially visualized right pleural effusion  TTE Echo 12/1 -  Gr I diastolic dysfunction LE [place] 12/1 - No evidence DVT in either LE  CXR 01/29/20 - Lines and tubes in stable position. New infiltrate right lung base. Persistent bibasilar atelectasis.   LUE [place] 01/29/20 - No evidence of DVT  EEG 12/4>>severe diffuse encephalopathy  MRI Brain 12/5>>No acute findings   CXR 12/10>> Improvement of right sided infiltrates/atelectasis. Left pleural effusion improving  Micro Data:  11/30 Bld Cx - no growth  11/30 MRSA - negative 12/07 Trache Aspirate Cx - Abundant WBC, predom PMN. No growth  Antimicrobials:  01/26/2020 cefepime  01/26/2020 - d/c'd 12/1 vancomycin 02/02/2020 - DC cefepime. Vancomycin/meropenem started for 7 day course (end date 12/13)  Interim history/subjective:  Patient continues to be sedated, his eyes are open this morning and he does cough when suction is advanced. Good urinary output yesterday post diuretics  Objective   Blood pressure 91/61, pulse 74, temperature 98.5 F (36.9 C), temperature source Oral, resp. rate (!) 22, height 5\' 10"  (1.778 m), weight 105.9 kg, SpO2 100 %.    Vent Mode: PRVC FiO2 (%):  [40 %] 40 % Set Rate:  [18 bmp] 18 bmp Vt Set:  [510 mL] 510 mL PEEP:  [5 cmH20] 5 cmH20 Pressure Support:  [10 cmH20] 10 cmH20 Plateau Pressure:  [13 cmH20] 13 cmH20   Intake/Output Summary (Last 24 hours) at 02/05/2020 0700 Last data filed at 02/05/2020 0600 Gross per 24 hour  Intake 4600.58 ml  Output 1165 ml  Net 3435.58 ml   Filed Weights   02/03/20 0400 02/04/20 0500 02/05/20 0500  Weight: 107.1 kg  105.1 kg 105.9 kg   Examination: General: Critically ill-appearing, on vent support HENT: Mucous membranes moist. PERRL Lungs: Bilateral mechanically ventilated breath sounds, no wheezing or rhonchi Cardiovascular: Regular rhythm, S1-S2 Abdomen: Distension. Bowel sounds present Extremities: SCD's in place. Upper extremities edematous.  GU: Condom cath in place. Yellow urine in bedside bag.  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 Echo and LE dopplers do not support PE. Patient continues to be encephalopathic despite improvement of pancreatitis. Sedatives contributing to this, but infection and lack of anti-psychotics may be as well. White count improving. Current abx regimen of vanc and meropenem.  Plan: -off pressors at [facility] time.  -negative BC from 01/26/20. Central line and foley removed -continue vancomycin and meropenem for 7 day course (end date 12/13) -increase seroquel to 100 mg morning and 200 mg in the afternoon  Leukocytosis Fever WBC 30>28 improving, he has not been febrile overnight. Unknown etiology at this time, his central line and foley catheter were removed. US performed of LUE that was negative. Right sided consolidation's seen on x-ray imaging are improving, no change on pulmonary examination. Changed to meropenem and vanc 02/02/20 - repeat BC today. Left CV and foley catheter removed -trend WBC -continue vancomycin and meropenem. Vanc trough pending  Acute Hypoxemic Respiratory Failure In setting of cardiac arrest, shock and pancreatitis Patient continues to desynchronous with vent. Suspect this is secondary to pain as when weaning, patient appears uncomfortable. Oxycodone increased to 10 mg 02/04/20. Also may be due to agitation from his history of schizophrenia, will increase Seroquel. If patient continues to be unable to be weaned off of the vent, will consider trache placement.  Plan: -Continue oxycodon and clonazepam. Wean versed, fentanyl, and precedex as tolerable -maintain full vent support with SAT/SBT as tolerated -titrate Vent setting to maintain SpO2 greater than or equal to 90%. -HOB elevated 30 degrees. -Plateau pressures less than 30 cm H20.  -Bronchial hygiene and RT/bronchodilator protocol.    Severe Pancreatitis  Unclear etiology, Tg elevated upon admission. IgG negative. Mother denies alcohol and drug use. Imaging negative for biliary stricture or cholestasis. CT a/p 12/06 no pancreatic hemorrhage, mass, or fluid collection seen. Abdomen with active bowel sounds. KUB performed yesterday, revealed nonobstructive bowel gas pattern. Plan: -patient does feel more distended over the past few days, suspect this may be secondary to lack of bowel movement. Bowel sounds present. KUB per above.  -increased miralax to BID -continue supportive care -tube feeds continued -if continues to be distended, will consider repeat CT a/p   Diabetic Ketoacidosis, likely secondary to pancreatic inflammation New onset in setting of pancreatitis. Off of insulin drip. Tube feeds in context of intubated state.  Plan: -Titrate SSI for goal CBG 140-180.  Hx of Schizophrenia Patient followed by [facility] Current outpatient regimen of abilify monthly. Mother states patient missed November dosage. Will start dosing with Seroquel. He was on this medication in the past and it was discontinued as patient was noncompliant with orals and preferred long acting agent.  - Seroquel started 02/03/20  Right mid/lower opacity on Pulmonary Imaging Last CXR 02/05/20, improvement of right sided opacities Plan: -continue vancomycin and meropenem   Acute Kidney Injury Hypernatremia Hypokalemia Creatinine improving. Sodium 148>147. K of 4.4.  Plan: -follow urine output, electrolytes -replace as needed.  Volume Overload Status Patient with good urinary output overnight, given 80 mg lasix yesterday.  Will assess patient's urinary output today and continue lasix if minimal output.  Plan: -monitor I/O -diurese as needed -monitor Cr  Best practice:  Diet: TF Pain/Anxiety/Delirium protocol (if indicated): oxycodone, klonopin, Seroquel, fentanyl, precedex, versed. Will attempt to wean off of versed    VAP protocol (if  indicated): Ordered DVT prophylaxis: SCD's / Lovenox. GI prophylaxis: PPI Glucose control: Standard glycemic control Mobility: Bedrest. Last date of multidisciplinary goals of care discussion: Goals of care discussion 02/03/2020 with mother Family and staff present: Nurse and doctor Summary of discussion: Patient is full code. Continue aggressive care measures. Follow up goals of care discussion due: 12/11 Code Status: Full  Labs   CBC: Recent Labs  Lab 01/31/20 0306 02/01/20 0403 02/01/20 1101 02/02/20 0535 02/03/20 0605 02/04/20 0155 02/05/20 0023  WBC 19.9* 27.7*  --  29.4* 29.7* 30.1* 28.4*  NEUTROABS 13.6*  --   --   --   --   --   --   HGB 11.6* 11.3* 10.2* 10.0* 10.6* 10.0* 10.9*  HCT 35.8* 33.7* 30.0* 31.0* 31.3* 30.2* 33.3*  MCV 93.0 90.6  --  93.9 93.2 93.8 93.8  PLT 190 222  --  226 254 235 215    Basic Metabolic Panel: Recent Labs  Lab 01/29/20 1706 01/30/20 0352 01/30/20 1614 01/30/20 2006 01/31/20 0306 02/01/20 0403 02/01/20 2125 02/02/20 0535 02/03/20 0047 02/04/20 0155 02/05/20 0023  NA  --  148*   < >  --  146*   < > 144 146* 151* 148* 147*  K  --  3.1*   < > 3.4* 3.1*   < > 3.3* 3.6 4.1 4.0 4.4  CL  --  109   < >  --  105   < > 104 103 109 108 108  CO2  --  25   < >  --  26   < > 27 29 26 25 25   GLUCOSE  --  242*   < >  --  240*   < > 327* 370* 124* 159* 167*  BUN  --  47*   < >  --  54*   < > 87* 99* 102* 90* 96*  CREATININE  --  1.35*   < >  --  1.45*   < > 2.52* 2.75* 2.59* 2.30* 2.40*  CALCIUM  --  9.1   < >  --  9.4   < > 8.6* 9.0 9.1 9.3 9.1  MG 2.3 2.1  --  2.1 2.1  --   --   --   --   --  2.2  PHOS 2.3* 2.5  --   --  3.3  --   --   --   --   --  5.1*   < > = values in this interval not displayed.   GFR: Estimated Creatinine Clearance: 48.4 mL/min (A) (by C-G formula based on SCr of 2.4 mg/dL (H)). Recent Labs  Lab 02/01/20 0819 02/02/20 0535 02/03/20 0605 02/04/20 0155 02/05/20 0023  WBC  --  29.4* 29.7* 30.1* 28.4*   LATICACIDVEN 1.4  --   --   --   --    Liver Function Tests: Recent Labs  Lab 01/31/20 0306 02/02/20 0535 02/03/20 0047 02/04/20 0155 02/05/20 0023  AST 88* 55* 84* 86* 60*  ALT 76* 58* 65* 78* 69*  ALKPHOS 252* 197* 158* 204* 179*  BILITOT 0.8 0.6 0.8 0.5 0.5  PROT 6.4* 6.3* 6.0* 6.2* 6.2*  ALBUMIN 1.9* 1.7*  1.5* 1.6* 1.8*   No results for input(s): LIPASE, AMYLASE in the last 168 hours. Recent Labs  Lab 01/30/20 1603  AMMONIA 27   ABG    Component Value Date/Time   PHART 7.491 (H) 02/01/2020 1101   PCO2ART 41.6 02/01/2020 1101   PO2ART 53 (L) 02/01/2020 1101   HCO3 31.5 (H) 02/01/2020 1101   TCO2 33 (H) 02/01/2020 1101   ACIDBASEDEF 5.0 (H) 01/26/2020 2111   O2SAT 88.0 02/01/2020 1101     Coagulation Profile: No results for input(s): INR, PROTIME in the last 168 hours.  Cardiac Enzymes: Recent Labs  Lab 02/02/20 1401  CKTOTAL 524*    HbA1C: Hgb A1c MFr Bld  Date/Time Value Ref Range Status  01/25/2020 05:40 PM 13.4 (H) 4.8 - 5.6 % Final    Comment:    (NOTE) Pre diabetes:          5.7%-6.4%  Diabetes:              >6.4%  Glycemic control for   <7.0% adults with diabetes     CBG: Recent Labs  Lab 02/04/20 1133 02/04/20 1518 02/04/20 1909 02/04/20 2305 02/05/20 0303  GLUCAP 218* 96 185* 157* 182*   Vasili Elanor Cale Internal Medicine Resident PGY-1 [facility]

## 2020-02-05 NOTE — Progress Notes (Signed)
eLink Physician-Brief Progress Note Patient Name: Bryan [name] DOB: 1975-08-17 MRN: [id number]   Date of Service  02/05/2020  HPI/Events of Note    eICU Interventions  Interval CXR ordered.        Thomasene Lot Antony Sian 02/05/2020, 4:53 AM

## 2020-02-05 NOTE — Progress Notes (Signed)
Pts HR dropped to 30s very briefly. Checked the leads and did an EKG. EKG showed NSR, and was placed in chart. MD and resident both made aware. Will continue to monitor closely. 

## 2020-02-05 NOTE — Progress Notes (Signed)
eLink Physician-Brief Progress Note Patient Name: Bryan [name] DOB: [birth year] MRN: [id number]   Date of Service  02/05/2020  HPI/Events of Note  RUE has warmth and erythema at site of his only PIV. May have infiltrated. No pressors have infused through that line fortunately.   eICU Interventions  RN will contact IV team to assist with placing new PIV. If they are unsuccessful I will ask the bedside team to try an U/S guided PIV.     Intervention Category Intermediate Interventions: Other:  Janae Bridgeman 02/05/2020, 11:10 PM

## 2020-02-06 LAB — COMPREHENSIVE METABOLIC PANEL
ALT: 54 U/L — ABNORMAL HIGH (ref 0–44)
AST: 44 U/L — ABNORMAL HIGH (ref 15–41)
Albumin: 1.6 g/dL — ABNORMAL LOW (ref 3.5–5.0)
Alkaline Phosphatase: 150 U/L — ABNORMAL HIGH (ref 38–126)
Anion gap: 13 (ref 5–15)
BUN: 92 mg/dL — ABNORMAL HIGH (ref 6–20)
CO2: 26 mmol/L (ref 22–32)
Calcium: 9.2 mg/dL (ref 8.9–10.3)
Chloride: 107 mmol/L (ref 98–111)
Creatinine, Ser: 2.17 mg/dL — ABNORMAL HIGH (ref 0.61–1.24)
GFR, Estimated: 38 mL/min — ABNORMAL LOW (ref >60)
Glucose, Bld: 132 mg/dL — ABNORMAL HIGH (ref 70–99)
Potassium: 4.1 mmol/L (ref 3.5–5.1)
Sodium: 146 mmol/L — ABNORMAL HIGH (ref 135–145)
Total Bilirubin: 0.5 mg/dL (ref 0.3–1.2)
Total Protein: 5.9 g/dL — ABNORMAL LOW (ref 6.5–8.1)

## 2020-02-06 LAB — CBC WITH DIFFERENTIAL/PLATELET
Abs Immature Granulocytes: 0.35 10*3/uL — ABNORMAL HIGH (ref 0.00–0.07)
Basophils Absolute: 0.1 10*3/uL (ref 0.0–0.1)
Basophils Relative: 0 %
Eosinophils Absolute: 0.1 10*3/uL (ref 0.0–0.5)
Eosinophils Relative: 0 %
HCT: 31.3 % — ABNORMAL LOW (ref 39.0–52.0)
Hemoglobin: 9.9 g/dL — ABNORMAL LOW (ref 13.0–17.0)
Immature Granulocytes: 1 %
Lymphocytes Relative: 6 %
Lymphs Abs: 1.7 10*3/uL (ref 0.7–4.0)
MCH: 30.1 pg (ref 26.0–34.0)
MCHC: 31.6 g/dL (ref 30.0–36.0)
MCV: 95.1 fL (ref 80.0–100.0)
Monocytes Absolute: 0.9 10*3/uL (ref 0.1–1.0)
Monocytes Relative: 3 %
Neutro Abs: 23.7 10*3/uL — ABNORMAL HIGH (ref 1.7–7.7)
Neutrophils Relative %: 90 %
Platelets: 239 10*3/uL (ref 150–400)
RBC: 3.29 MIL/uL — ABNORMAL LOW (ref 4.22–5.81)
RDW: 15 % (ref 11.5–15.5)
WBC: 26.8 10*3/uL — ABNORMAL HIGH (ref 4.0–10.5)
nRBC: 0.1 % (ref 0.0–0.2)

## 2020-02-06 LAB — CBC
HCT: 28.1 % — ABNORMAL LOW (ref 39.0–52.0)
Hemoglobin: 9.4 g/dL — ABNORMAL LOW (ref 13.0–17.0)
MCH: 31.9 pg (ref 26.0–34.0)
MCHC: 33.5 g/dL (ref 30.0–36.0)
MCV: 95.3 fL (ref 80.0–100.0)
Platelets: 230 10*3/uL (ref 150–400)
RBC: 2.95 MIL/uL — ABNORMAL LOW (ref 4.22–5.81)
RDW: 15 % (ref 11.5–15.5)
WBC: 24.2 10*3/uL — ABNORMAL HIGH (ref 4.0–10.5)
nRBC: 0.2 % (ref 0.0–0.2)

## 2020-02-06 LAB — GLUCOSE, CAPILLARY
Glucose-Capillary: 169 mg/dL — ABNORMAL HIGH (ref 70–99)
Glucose-Capillary: 150 mg/dL — ABNORMAL HIGH (ref 70–99)
Glucose-Capillary: 163 mg/dL — ABNORMAL HIGH (ref 70–99)
Glucose-Capillary: 221 mg/dL — ABNORMAL HIGH (ref 70–99)
Glucose-Capillary: 220 mg/dL — ABNORMAL HIGH (ref 70–99)
Glucose-Capillary: 166 mg/dL — ABNORMAL HIGH (ref 70–99)
Glucose-Capillary: 144 mg/dL — ABNORMAL HIGH (ref 70–99)

## 2020-02-06 LAB — RENAL FUNCTION PANEL
Albumin: 1.8 g/dL — ABNORMAL LOW (ref 3.5–5.0)
Anion gap: 12 (ref 5–15)
BUN: 82 mg/dL — ABNORMAL HIGH (ref 6–20)
CO2: 23 mmol/L (ref 22–32)
Calcium: 9.2 mg/dL (ref 8.9–10.3)
Chloride: 106 mmol/L (ref 98–111)
Creatinine, Ser: 1.87 mg/dL — ABNORMAL HIGH (ref 0.61–1.24)
GFR, Estimated: 45 mL/min — ABNORMAL LOW (ref >60)
Glucose, Bld: 249 mg/dL — ABNORMAL HIGH (ref 70–99)
Phosphorus: 4.6 mg/dL (ref 2.5–4.6)
Potassium: 5.5 mmol/L — ABNORMAL HIGH (ref 3.5–5.1)
Sodium: 141 mmol/L (ref 135–145)

## 2020-02-06 LAB — VANCOMYCIN, TROUGH: Vancomycin Tr: 16 ug/mL (ref 15–20)

## 2020-02-06 LAB — PHOSPHORUS: Phosphorus: 6.3 mg/dL — ABNORMAL HIGH (ref 2.5–4.6)

## 2020-02-06 LAB — MAGNESIUM: Magnesium: 2.3 mg/dL (ref 1.7–2.4)

## 2020-02-06 MED ORDER — FENTANYL CITRATE (PF) 100 MCG/2ML IJ SOLN
INTRAMUSCULAR | Status: AC
  Administered 2020-02-06: 20:00:00 100 ug
  Filled 2020-02-06: qty 2

## 2020-02-06 MED ORDER — FUROSEMIDE 10 MG/ML IJ SOLN
40.0000 mg | Freq: Once | INTRAMUSCULAR | Status: AC
  Administered 2020-02-06: 18:00:00 40 mg via INTRAVENOUS
  Filled 2020-02-06: qty 4

## 2020-02-06 MED ORDER — MIDAZOLAM HCL 2 MG/2ML IJ SOLN
2.0000 mg | INTRAMUSCULAR | Status: DC | PRN
  Administered 2020-02-06 – 2020-02-07 (×4): 2 mg via INTRAVENOUS
  Filled 2020-02-06 (×4): qty 2

## 2020-02-06 MED ORDER — FENTANYL CITRATE (PF) 100 MCG/2ML IJ SOLN
25.0000 ug | INTRAMUSCULAR | Status: DC | PRN
  Administered 2020-02-06 – 2020-02-07 (×2): 100 ug via INTRAVENOUS
  Filled 2020-02-06 (×2): qty 2

## 2020-02-06 MED ORDER — LINEZOLID 600 MG/300ML IV SOLN
600.0000 mg | Freq: Two times a day (BID) | INTRAVENOUS | Status: DC
  Administered 2020-02-06 – 2020-02-07 (×2): 600 mg via INTRAVENOUS
  Filled 2020-02-06 (×3): qty 300

## 2020-02-06 NOTE — Progress Notes (Signed)
eLink Physician-Brief Progress Note Patient Name: Bryan [name] DOB: 1975-03-27 MRN: [id number]   Date of Service  02/06/2020  HPI/Events of Note  Called to room because patient had ~1 minute of possible seizure activity. This consisted of him biting down hard on his ETT and distinctly upward eye gaze. Self-resolved. These episodes (including biting of tube and upward gaze) have been previously noted by RNs. I note that the patient has had a spot 30 minute EEG but not a cvEEG.   eICU Interventions  Possible self-limited/intermittent seizure activity.  Day team may wish to consider neurology consultation and continuous video EEG for further evaluation.     Intervention Category Major Interventions: Seizures - evaluation and management  Janae Bridgeman 02/06/2020, 2:34 AM

## 2020-02-06 NOTE — Progress Notes (Signed)
eLink Physician-Brief Progress Note Patient Name: Bryan [name] DOB: 1975/09/25 MRN: [id number]   Date of Service  02/06/2020  HPI/Events of Note  Patient with sub-optimal sedation on the ventilator, he also has a temperature spike of 101.2 degrees.  eICU Interventions  PRN Fentanyl and Versed ordered to improve sedation, blood cultures x 2 and sputum cultures ordered for temp spike.        Thomasene Lot Shaleen Talamantez 02/06/2020, 7:43 PM

## 2020-02-06 NOTE — Progress Notes (Signed)
eLink Physician-Brief Progress Note Patient Name: Bryan [name] DOB: Mar 29, 1975 MRN: [id number]   Date of Service  02/06/2020  HPI/Events of Note  Notified by RN that the patient had a sinus pause of 2.3 seconds, then a longer pause of 6.6 seconds. The patient then returned to NSR. This occurred while RT was performing in-line suctioning. Per RN report, this has occurred several times.  I note that the patient had a PEA arrest on 11/30 while in the ED. That arrest occurred in the setting of him presenting with DKA and severe acute pancreatitis, c/b profound hypotension (70s/40s) and tachycardia to the 140s.   eICU Interventions  The patient's history of PEA arrest and the current episodes of sinus pauses are very unlikely to be related. The former was due to critical illness including acid-base + electrolyte abnormalities as well as volume depletion and hemodynamic instability. The latter is being caused by a sudden increase in vagal / parasympathetic tone in response to in-line suctioning (a normal, albeit somewhat exaggerated, physiologic response).  Recommend keeping in-line suctioning to minimum needed to clear secretions. Also recommend minimizing the length of in-line suction tubing that extends beyond the distal tip of the ETT, since it is the suction catheter coming into contact with the innervated airway mucosa that causes a reflex increase in parasympathetic tone.     Intervention Category Intermediate Interventions: Arrhythmia - evaluation and management  Marveen Reeks Ayyan Sites 02/06/2020, 1:07 AM

## 2020-02-06 NOTE — Progress Notes (Signed)
While Respiratory Therapy was in-line suctioning, Pt. Had a vagal response and sinus pause for ~2.28 seconds at 00:01 immediately followed by a sinus arrest lasting ~6.60 seconds. After episode, Pt.'s heart rate jumped up to 100's, and is now in NSR. ELink MD notified. No new orders at this time. Will continue to monitor very closely.   Cedric Fishman, RN

## 2020-02-06 NOTE — Progress Notes (Signed)
Pharmacy Antibiotic Note  Bryan [name] is a 44 y.o. male admitted on 01/25/2020 with acute pancreatitis and DKA, currently with rising WBC, recent fevers concerning for worsening sepsis.  Pharmacy has been consulted for Merrem and Vancomycin dosing. SCr rising at 2.75 with aggressive diuresis last 2 days - holding on further diuresis. Lactic acid is trending down. WBC continues to climb. All cultures have been negative. CVC removed this AM.   Vanc trough of 16 today at steady state. This is within the desired goal range.  Plan: Continue Merrem 2 g IV every 12 hours x 7days.  Continue Vancomycin 1250 mg IV every 24 hours x 7 days.  Monitor renal function and clinical status  Height: 5\' 10"  (177.8 cm) Weight: 106.7 kg (235 lb 3.7 oz) IBW/kg (Calculated) : 73  Temp (24hrs), Avg:99 F (37.2 C), Min:98.5 F (36.9 C), Max:99.3 F (37.4 C)  Recent Labs  Lab 02/01/20 0819 02/01/20 2125 02/02/20 0535 02/03/20 0047 02/03/20 0605 02/04/20 0155 02/05/20 0023 02/06/20 0130 02/06/20 0845  WBC  --   --  29.4*  --  29.7* 30.1* 28.4* 24.2*  --   CREATININE  --    < > 2.75* 2.59*  --  2.30* 2.40* 2.17*  --   LATICACIDVEN 1.4  --   --   --   --   --   --   --   --   VANCOTROUGH  --   --   --   --   --   --   --   --  16   < > = values in this interval not displayed.    Estimated Creatinine Clearance: 53.7 mL/min (A) (by C-G formula based on SCr of 2.17 mg/dL (H)).    Allergies  Allergen Reactions  . Fluphenazine Shortness Of Breath and Swelling    Tongue swelling, and shaking  . Coffee Bean Extract [Coffea Arabica] Swelling    Antimicrobials this admission: Vancomycin 11/30 x1 dose; Restart 12/7 >> Cefepime 11/30 >>12/7 Meropenem 12/7 >>  Dose adjustments this admission:   Microbiology results: 11/30 Blood cultures - negative 11/30 MRSA PCR negative 11/30 COVID/Flu negative  Thank you for allowing pharmacy to be a part of this patient's care.  12/30, PharmD,  [facility] Clinical Pharmacist Please see AMION for all Pharmacists' Contact Phone Numbers 02/06/2020, 10:01 AM

## 2020-02-06 NOTE — Progress Notes (Signed)
NAME:  Bryan [name], MRN:  [id number], DOB:  [birth year], LOS: 12 ADMISSION DATE:  01/25/2020, CONSULTATION DATE:  01/25/20 REFERRING MD:  Claudette Laws CHIEF COMPLAINT:  AMS    Brief History   Bryan [name] is a 44 y.o. male admitted w/ DKA on 11/29.  PCCM consulted 11/30 for agitation and borderline hypotension. Patient later had PEA cardiac arrest, 2 rounds of CPR when ROSC achieved, patient intubated. Transferred to ICU, CVL and arterial line placed.   CT Head is unremarkable except for right sinus disease. CT Chest/Abdomen/Pelvis showed evidence of moderate acute pancreatitis, mild ascites and small bilateral pleural effusions. Lipase of 315U/L on 01/26/20. No initial clear etiology for pancreatitis, mother denies patient using alcohol, CT a/p negative for dilated biliary ducts or gall stones. Triglycerides found to be 600.    Past Medical History   Past Medical History:  Diagnosis Date  . Hypertension   . Psychiatric problem    Seen at [facility] for unknown reason. Takes seroquel. History of hearing voices.  Not commandivng voices.   . Schizophrenia (HCC)   . Tremors of nervous system     Significant Hospital Events   11/29 - admitted 11/30 - PEA Arrest and transfer to ICU  Consults:  PCCM  Procedures:  11/30 ETT 11/30 A line, removed 01/28/20 11/30 Left IJ CVL 11/30 NG tube placed 12/7 Left IJ CVL removed 12/8 Foley Catheter removed  Significant Diagnostic Tests:  CXR 11/29 - neg.  CT Head 12/1 - right sinus disease, no acute intracranial pathology CT Chest/Abd/Pel 12/1 -moderate pancreatis, mild asicites. Small bilateral pleural effusions Abd x-ray 12/1 - NG tube tip and side port in proximal stomach. No bowel obstruction or free air CXR 12/1 - No pneumothorax, developing pneumonia left lower lobe. Mild bibasilar atelectasis. RUQ [place] 12/1 - GB sludge, negative acute cholecystitis. Fatty liver. Small ascites/partially visualized right pleural effusion  TTE Echo 12/1 -  Gr I diastolic dysfunction LE [place] 12/1 - No evidence DVT in either LE  CXR 01/29/20 - Lines and tubes in stable position. New infiltrate right lung base. Persistent bibasilar atelectasis.   LUE [place] 01/29/20 - No evidence of DVT  EEG 12/4>>severe diffuse encephalopathy  MRI Brain 12/5>>No acute findings   CXR 12/10>> Improvement of right sided infiltrates/atelectasis. Left pleural effusion improving  Micro Data:  11/30 Bld Cx - no growth  11/30 MRSA - negative 12/07 Trache Aspirate Cx - Abundant WBC, predom PMN. No growth  Antimicrobials:  01/26/2020 cefepime  01/26/2020 - d/c'd 12/1 vancomycin 02/02/2020 - DC cefepime. Vancomycin/meropenem started for 7 day course (end date 12/13)  Interim history/subjective:  Patient remains intubated. Nursing note overnight patient had right PIV removed, area appeared infiltrated. New left sided PIV was placed. At 0014, patient appeared to have sinus pause for 6 seconds after in line suctioning occurred. Also patient noted to have episode of biting tube with upward eye gaze. Episode self resolved after 1 minute.   Objective   Blood pressure (!) 156/93, pulse (!) 127, temperature 98.9 F (37.2 C), temperature source Oral, resp. rate 17, height 5\' 10"  (1.778 m), weight 106.7 kg, SpO2 100 %.    Vent Mode: PRVC FiO2 (%):  [40 %] 40 % Set Rate:  [18 bmp] 18 bmp Vt Set:  [510 mL] 510 mL PEEP:  [5 cmH20] 5 cmH20 Pressure Support:  [5 cmH20] 5 cmH20 Plateau Pressure:  [15 cmH20] 15 cmH20   Intake/Output Summary (Last 24 hours) at 02/06/2020 0653 Last data filed at 02/06/2020 0600  Gross per 24 hour  Intake 4436.58 ml  Output 3425 ml  Net 1011.58 ml   Filed Weights   02/04/20 0500 02/05/20 0500 02/06/20 0500  Weight: 105.1 kg 105.9 kg 106.7 kg   Examination: General: Critically ill-appearing, on vent support HENT: Mucous membranes moist. PERRL. Abrasion of upper lip Lungs: Bilateral mechanically ventilated breath sounds, no wheezing or  rhonchi Cardiovascular: Regular rhythm, S1-S2 Abdomen: Distension improving. Hyperactive bowel sounds present Extremities: SCD's in place. Upper extremities edematous. Erythema noted of left and right extremities as well as chest wall. GU: Condom cath in place.   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 Echo and LE dopplers do not support PE. Patient continues to be encephalopathic.  Do not believe patient had a seizure overnight, believe that biting on tube and eye gaze is patient's agitation with being off of the sedatives/pain and intubated state. Plan to discontinue sedatives today and see how patient reacts. Plan: -off pressors at this time.  -stop sedatives and if needed for tachycardia/agitation will give bolus of sedatives -wrist restraints and mittens in place during this time -continue seroquel to 100 mg morning and 200 mg in the afternoon  Leukocytosis Fever WBC 28>24 improving, he has not been febrile overnight. Unknown etiology at this time, his central line and foley catheter were removed. US performed of LUE that was negative. Right sided consolidation's seen on x-ray imaging are improving, no change on pulmonary examination. Changed to meropenem and vanc 02/02/20 -left CV and foley catheter removed -continue vancomycin and meropenem end date 02/08/20.  -trend WBC  Acute Hypoxemic Respiratory Failure In setting of cardiac arrest, shock and pancreatitis Patient continues to desynchronous with vent. Suspect this is secondary to pain,discomfort, and agitation. His respirations continue to improve. Will attempt to wean off of sedatives, continue anti-psychotics. Suspect once patient is able to be calm and alert, we will be able to extubate him. Plan: -Discontinued sedatives, give bolus' as needed -maintain full vent  support with SAT/SBT as tolerated -titrate Vent setting to maintain SpO2 greater than or equal to 90%. -HOB elevated 30 degrees. -Plateau pressures less than 30 cm H20.  -Bronchial hygiene and RT/bronchodilator protocol.   Severe Pancreatitis  Unclear etiology, Tg elevated upon admission. IgG negative. Mother denies alcohol and drug use. Imaging negative for biliary stricture or cholestasis. CT a/p 12/06 no pancreatic hemorrhage, mass, or fluid collection seen. Abdomen with active bowel sounds. KUB performed yesterday, revealed nonobstructive bowel gas pattern. Plan: -Improving  -Miralax BID -continue supportive care -tube feeds continued   Diabetic Ketoacidosis, likely secondary to pancreatic inflammation New onset in setting of pancreatitis. Off of insulin drip. Tube feeds in context of intubated state.  Plan: -Titrate SSI for goal CBG 140-180.  Hx of Schizophrenia Patient followed by [facility]. Current outpatient regimen of abilify monthly. Mother states patient missed November dosage.  - Seroquel 100 mg in the morning and 200 mg in the evening.  Right mid/lower opacity on Pulmonary Imaging Last CXR 02/05/20, improvement of right sided opacities Plan: -continue vancomycin and meropenem   Acute Kidney Injury Hypernatremia Hypokalemia Hyperphosphatemia Creatinine improving. Sodium 147>146. K of 4.1.  Phos of 6.3, will repeat renal panel is afternoon and add phos binder if needed Plan: -follow urine output, electrolytes -replace as needed.  Volume Overload Status Urinary output doing well off of lasix. Extremity edema appears to be improving.  Plan: -monitor I/O -diurese as needed -monitor Cr  Best practice:  Diet: TF Pain/Anxiety/Delirium protocol (if indicated): PRN precedex VAP protocol (if indicated): Ordered DVT prophylaxis: SCD's / Lovenox. GI prophylaxis: PPI Glucose control: Standard glycemic control Mobility: Bedrest. Last date of multidisciplinary goals of  care discussion: Goals of care discussion with mother 12/10 Family and staff present: Nurse and doctor Summary of discussion: Patient is full code. Continue aggressive care measures. Follow up goals of care discussion due: 12/12 Code Status: Full  Labs   CBC: Recent Labs  Lab 01/31/20 0306 02/01/20 0403 02/02/20 0535 02/03/20 0605 02/04/20 0155 02/05/20 0023 02/06/20 0130  WBC 19.9*   < > 29.4* 29.7* 30.1* 28.4* 24.2*  NEUTROABS 13.6*  --   --   --   --   --   --   HGB 11.6*   < > 10.0* 10.6* 10.0* 10.9* 9.4*  HCT 35.8*   < > 31.0* 31.3* 30.2* 33.3* 28.1*  MCV 93.0   < > 93.9 93.2 93.8 93.8 95.3  PLT 190   < > 226 254 235 215 230   < > = values in this interval not displayed.    Basic Metabolic Panel: Recent Labs  Lab 01/30/20 2006 01/31/20 0306 02/01/20 0403 02/02/20 0535 02/03/20 0047 02/04/20 0155 02/05/20 0023 02/06/20 0130  NA  --  146*   < > 146* 151* 148* 147* 146*  K 3.4* 3.1*   < > 3.6 4.1 4.0 4.4 4.1  CL  --  105   < > 103 109 108 108 107  CO2  --  26   < > 29 26 25 25 26   GLUCOSE  --  240*   < > 370* 124* 159* 167* 132*  BUN  --  54*   < > 99* 102* 90* 96* 92*  CREATININE  --  1.45*   < > 2.75* 2.59* 2.30* 2.40* 2.17*  CALCIUM  --  9.4   < > 9.0 9.1 9.3 9.1 9.2  MG 2.1 2.1  --   --   --   --  2.2 2.3  PHOS  --  3.3  --   --   --   --  5.1* 6.3*   < > = values in this interval not displayed.   GFR: Estimated Creatinine Clearance: 53.7 mL/min (A) (by C-G formula based on SCr of 2.17 mg/dL (H)). Recent Labs  Lab 02/01/20 0819 02/02/20 0535 02/03/20 0605 02/04/20 0155 02/05/20 0023 02/06/20 0130  WBC  --    < > 29.7* 30.1* 28.4* 24.2*  LATICACIDVEN 1.4  --   --   --   --   --    < > = values in this interval not displayed.   Liver Function Tests: Recent Labs  Lab 02/02/20 0535 02/03/20 0047 02/04/20 0155 02/05/20 0023 02/06/20 0130  AST 55* 84* 86* 60* 44*  ALT 58* 65* 78* 69* 54*  ALKPHOS 197* 158* 204* 179* 150*  BILITOT 0.6 0.8 0.5  0.5 0.5  PROT 6.3* 6.0* 6.2* 6.2* 5.9*  ALBUMIN 1.7* 1.5* 1.6* 1.8* 1.6*   No results for input(s): LIPASE, AMYLASE in the last 168 hours. Recent Labs  Lab 01/30/20 1603  AMMONIA 27   ABG    Component Value Date/Time   PHART 7.491 (H) 02/01/2020 1101   PCO2ART 41.6 02/01/2020 1101   PO2ART 53 (L) 02/01/2020 1101   HCO3 31.5 (H) 02/01/2020 1101   TCO2 33 (H) 02/01/2020 1101   ACIDBASEDEF  5.0 (H) 01/26/2020 2111   O2SAT 88.0 02/01/2020 1101     Coagulation Profile: No results for input(s): INR, PROTIME in the last 168 hours.  Cardiac Enzymes: Recent Labs  Lab 02/02/20 1401  CKTOTAL 524*    HbA1C: Hgb A1c MFr Bld  Date/Time Value Ref Range Status  01/25/2020 05:40 PM 13.4 (H) 4.8 - 5.6 % Final    Comment:    (NOTE) Pre diabetes:          5.7%-6.4%  Diabetes:              >6.4%  Glycemic control for   <7.0% adults with diabetes     CBG: Recent Labs  Lab 02/05/20 1102 02/05/20 1528 02/05/20 1917 02/05/20 2334 02/06/20 0404  GLUCAP 192* 172* 176* 176* 169*   Vasili Tanise Russman Internal Medicine Resident PGY-1 [facility]

## 2020-02-07 LAB — COMPREHENSIVE METABOLIC PANEL
ALT: 48 U/L — ABNORMAL HIGH (ref 0–44)
AST: 47 U/L — ABNORMAL HIGH (ref 15–41)
Albumin: 1.9 g/dL — ABNORMAL LOW (ref 3.5–5.0)
Alkaline Phosphatase: 137 U/L — ABNORMAL HIGH (ref 38–126)
Anion gap: 15 (ref 5–15)
BUN: 82 mg/dL — ABNORMAL HIGH (ref 6–20)
CO2: 24 mmol/L (ref 22–32)
Calcium: 9.5 mg/dL (ref 8.9–10.3)
Chloride: 105 mmol/L (ref 98–111)
Creatinine, Ser: 2.03 mg/dL — ABNORMAL HIGH (ref 0.61–1.24)
GFR, Estimated: 41 mL/min — ABNORMAL LOW (ref >60)
Glucose, Bld: 161 mg/dL — ABNORMAL HIGH (ref 70–99)
Potassium: 5.2 mmol/L — ABNORMAL HIGH (ref 3.5–5.1)
Sodium: 144 mmol/L (ref 135–145)
Total Bilirubin: 0.7 mg/dL (ref 0.3–1.2)
Total Protein: 6.6 g/dL (ref 6.5–8.1)

## 2020-02-07 LAB — CBC WITH DIFFERENTIAL/PLATELET
Abs Immature Granulocytes: 0.17 10*3/uL — ABNORMAL HIGH (ref 0.00–0.07)
Basophils Absolute: 0.1 10*3/uL (ref 0.0–0.1)
Basophils Relative: 0 %
Eosinophils Absolute: 0.1 10*3/uL (ref 0.0–0.5)
Eosinophils Relative: 0 %
HCT: 29.1 % — ABNORMAL LOW (ref 39.0–52.0)
Hemoglobin: 9.7 g/dL — ABNORMAL LOW (ref 13.0–17.0)
Immature Granulocytes: 1 %
Lymphocytes Relative: 7 %
Lymphs Abs: 1.5 10*3/uL (ref 0.7–4.0)
MCH: 31.3 pg (ref 26.0–34.0)
MCHC: 33.3 g/dL (ref 30.0–36.0)
MCV: 93.9 fL (ref 80.0–100.0)
Monocytes Absolute: 0.9 10*3/uL (ref 0.1–1.0)
Monocytes Relative: 4 %
Neutro Abs: 20 10*3/uL — ABNORMAL HIGH (ref 1.7–7.7)
Neutrophils Relative %: 88 %
Platelets: 263 10*3/uL (ref 150–400)
RBC: 3.1 MIL/uL — ABNORMAL LOW (ref 4.22–5.81)
RDW: 14.9 % (ref 11.5–15.5)
WBC: 22.7 10*3/uL — ABNORMAL HIGH (ref 4.0–10.5)
nRBC: 0.1 % (ref 0.0–0.2)

## 2020-02-07 LAB — POCT I-STAT 7, (LYTES, BLD GAS, ICA,H+H)
Acid-Base Excess: 8 mmol/L — ABNORMAL HIGH (ref 0.0–2.0)
Bicarbonate: 32.1 mmol/L — ABNORMAL HIGH (ref 20.0–28.0)
Calcium, Ion: 1.28 mmol/L (ref 1.15–1.40)
HCT: 30 % — ABNORMAL LOW (ref 39.0–52.0)
Hemoglobin: 10.2 g/dL — ABNORMAL LOW (ref 13.0–17.0)
O2 Saturation: 100 %
Patient temperature: 100.2
Potassium: 4.6 mmol/L (ref 3.5–5.1)
Sodium: 144 mmol/L (ref 135–145)
TCO2: 33 mmol/L — ABNORMAL HIGH (ref 22–32)
pCO2 arterial: 46.4 mmHg (ref 32.0–48.0)
pH, Arterial: 7.452 — ABNORMAL HIGH (ref 7.350–7.450)
pO2, Arterial: 250 mmHg — ABNORMAL HIGH (ref 83.0–108.0)

## 2020-02-07 LAB — GLUCOSE, CAPILLARY
Glucose-Capillary: 166 mg/dL — ABNORMAL HIGH (ref 70–99)
Glucose-Capillary: 212 mg/dL — ABNORMAL HIGH (ref 70–99)
Glucose-Capillary: 358 mg/dL — ABNORMAL HIGH (ref 70–99)
Glucose-Capillary: 275 mg/dL — ABNORMAL HIGH (ref 70–99)
Glucose-Capillary: 254 mg/dL — ABNORMAL HIGH (ref 70–99)

## 2020-02-07 LAB — CBC
HCT: 31.1 % — ABNORMAL LOW (ref 39.0–52.0)
Hemoglobin: 10.4 g/dL — ABNORMAL LOW (ref 13.0–17.0)
MCH: 31.2 pg (ref 26.0–34.0)
MCHC: 33.4 g/dL (ref 30.0–36.0)
MCV: 93.4 fL (ref 80.0–100.0)
Platelets: 223 10*3/uL (ref 150–400)
RBC: 3.33 MIL/uL — ABNORMAL LOW (ref 4.22–5.81)
RDW: 15.1 % (ref 11.5–15.5)
WBC: 26 10*3/uL — ABNORMAL HIGH (ref 4.0–10.5)
nRBC: 0.2 % (ref 0.0–0.2)

## 2020-02-07 LAB — MAGNESIUM: Magnesium: 2.3 mg/dL (ref 1.7–2.4)

## 2020-02-07 LAB — PHOSPHORUS: Phosphorus: 5.5 mg/dL — ABNORMAL HIGH (ref 2.5–4.6)

## 2020-02-07 LAB — TRIGLYCERIDES: Triglycerides: 265 mg/dL — ABNORMAL HIGH (ref <150)

## 2020-02-07 MED ORDER — PROPOFOL 1000 MG/100ML IV EMUL
5.0000 ug/kg/min | INTRAVENOUS | Status: DC
  Administered 2020-02-07: 04:00:00 10 ug/kg/min via INTRAVENOUS
  Administered 2020-02-07: 08:00:00 50 ug/kg/min via INTRAVENOUS
  Filled 2020-02-07 (×2): qty 100

## 2020-02-07 MED ORDER — FUROSEMIDE 10 MG/ML IJ SOLN
40.0000 mg | Freq: Once | INTRAMUSCULAR | Status: AC
  Administered 2020-02-07: 10:00:00 40 mg via INTRAVENOUS
  Filled 2020-02-07: qty 4

## 2020-02-07 MED ORDER — DEXMEDETOMIDINE HCL IN NACL 400 MCG/100ML IV SOLN
0.2000 ug/kg/h | INTRAVENOUS | Status: DC
  Administered 2020-02-07: 10:00:00 0.4 ug/kg/h via INTRAVENOUS
  Administered 2020-02-07 (×4): 1.2 ug/kg/h via INTRAVENOUS
  Administered 2020-02-08: 14:00:00 1.4 ug/kg/h via INTRAVENOUS
  Administered 2020-02-08: 21:00:00 1.6 ug/kg/h via INTRAVENOUS
  Administered 2020-02-08: 10:00:00 1.5 ug/kg/h via INTRAVENOUS
  Administered 2020-02-08: 16:00:00 1.4 ug/kg/h via INTRAVENOUS
  Administered 2020-02-08: 18:00:00 1.5 ug/kg/h via INTRAVENOUS
  Administered 2020-02-08: 03:00:00 1.3 ug/kg/h via INTRAVENOUS
  Administered 2020-02-08 (×2): 1.6 ug/kg/h via INTRAVENOUS
  Administered 2020-02-08: 06:00:00 1.4 ug/kg/h via INTRAVENOUS
  Administered 2020-02-09: 09:00:00 1.1 ug/kg/h via INTRAVENOUS
  Administered 2020-02-09: 15:00:00 1.5 ug/kg/h via INTRAVENOUS
  Administered 2020-02-09: 20:00:00 1.6 ug/kg/h via INTRAVENOUS
  Administered 2020-02-09: 07:00:00 1.4 ug/kg/h via INTRAVENOUS
  Administered 2020-02-09: 04:00:00 1.6 ug/kg/h via INTRAVENOUS
  Administered 2020-02-09: 13:00:00 1.5 ug/kg/h via INTRAVENOUS
  Administered 2020-02-09: 22:00:00 1.6 ug/kg/h via INTRAVENOUS
  Administered 2020-02-09: 17:00:00 1.5 ug/kg/h via INTRAVENOUS
  Administered 2020-02-10: 20:00:00 1.2 ug/kg/h via INTRAVENOUS
  Administered 2020-02-10: 05:00:00 1.6 ug/kg/h via INTRAVENOUS
  Administered 2020-02-10: 18:00:00 1.2 ug/kg/h via INTRAVENOUS
  Administered 2020-02-10: 08:00:00 0.8 ug/kg/h via INTRAVENOUS
  Administered 2020-02-10: 23:00:00 1.2 ug/kg/h via INTRAVENOUS
  Administered 2020-02-10: 04:00:00 1.6 ug/kg/h via INTRAVENOUS
  Administered 2020-02-10: 14:00:00 1.2 ug/kg/h via INTRAVENOUS
  Administered 2020-02-10: 01:00:00 1.6 ug/kg/h via INTRAVENOUS
  Administered 2020-02-10: 10:00:00 1 ug/kg/h via INTRAVENOUS
  Administered 2020-02-11: 0.4 ug/kg/h via INTRAVENOUS
  Administered 2020-02-11: 16:00:00 0.6 ug/kg/h via INTRAVENOUS
  Administered 2020-02-11 (×2): 1.2 ug/kg/h via INTRAVENOUS
  Administered 2020-02-11: 09:00:00 0.6 ug/kg/h via INTRAVENOUS
  Administered 2020-02-12 – 2020-02-13 (×2): 0.4 ug/kg/h via INTRAVENOUS
  Administered 2020-02-13 (×2): 0.6 ug/kg/h via INTRAVENOUS
  Administered 2020-02-13: 17:00:00 0.2 ug/kg/h via INTRAVENOUS
  Administered 2020-02-13: 10:00:00 0.8 ug/kg/h via INTRAVENOUS
  Administered 2020-02-14: 04:00:00 0.7 ug/kg/h via INTRAVENOUS
  Administered 2020-02-14: 0.8 ug/kg/h via INTRAVENOUS
  Administered 2020-02-15 (×2): 0.4 ug/kg/h via INTRAVENOUS
  Administered 2020-02-15: 04:00:00 0.8 ug/kg/h via INTRAVENOUS
  Filled 2020-02-07 (×3): qty 100
  Filled 2020-02-07: qty 200
  Filled 2020-02-07 (×12): qty 100
  Filled 2020-02-07 (×3): qty 200
  Filled 2020-02-07 (×5): qty 100
  Filled 2020-02-07: qty 200
  Filled 2020-02-07 (×4): qty 100
  Filled 2020-02-07: qty 200
  Filled 2020-02-07 (×4): qty 100
  Filled 2020-02-07: qty 200
  Filled 2020-02-07 (×3): qty 100
  Filled 2020-02-07 (×2): qty 200

## 2020-02-07 MED ORDER — FENTANYL CITRATE (PF) 100 MCG/2ML IJ SOLN
25.0000 ug | INTRAMUSCULAR | Status: DC | PRN
  Administered 2020-02-07 – 2020-02-17 (×22): 100 ug via INTRAVENOUS
  Administered 2020-02-17: 50 ug via INTRAVENOUS
  Administered 2020-02-17: 100 ug via INTRAVENOUS
  Administered 2020-02-18 (×2): 50 ug via INTRAVENOUS
  Administered 2020-02-20: 25 ug via INTRAVENOUS
  Administered 2020-02-20: 100 ug via INTRAVENOUS
  Administered 2020-02-20: 50 ug via INTRAVENOUS
  Administered 2020-02-25: 25 ug via INTRAVENOUS
  Administered 2020-03-03 – 2020-03-04 (×2): 100 ug via INTRAVENOUS
  Filled 2020-02-07 (×31): qty 2

## 2020-02-07 MED ORDER — METOLAZONE 2.5 MG PO TABS
5.0000 mg | ORAL_TABLET | Freq: Once | ORAL | Status: AC
  Administered 2020-02-07: 10:00:00 5 mg via ORAL
  Filled 2020-02-07: qty 2

## 2020-02-07 MED ORDER — FREE WATER
200.0000 mL | Status: DC
  Administered 2020-02-07 – 2020-02-09 (×12): 200 mL

## 2020-02-07 NOTE — Progress Notes (Signed)
PCCM Update: Patient has had erythematous rash over his bilateral antecubital areas, arm pits, around his upper chest and lower neck and medial thighs. This was noted at on 02/05/20. Vancomycin was stopped on 12/11 as this was thought to be the culprit. This afternoon he was noted to have bullous eruption along his left neck, upper back and left arm.  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  Will discontinue the following medications: Linezolid (started 12/11) Meropenem (started 12/7) Pantoprazole (started 11/30) No further use of lasix (first dose on 01/29/20)   Pictures are shown below:       Left Arm   Right Arm   Back   Melody Comas, MD [facility] Office: [phone]   See Amion for Pager Details

## 2020-02-07 NOTE — Progress Notes (Signed)
Wasted 50 ml of Fentanyl in sink with Elita Quick, [employer] fundraiser.

## 2020-02-07 NOTE — Progress Notes (Signed)
eLink Physician-Brief Progress Note Patient Name: Bryan [name] DOB: 1975/03/01 MRN: [id number]   Date of Service  02/07/2020  HPI/Events of Note  Patient with sub-optimal sedation and ventilator dyssynchrony. PICC line is intact.  eICU Interventions  Precedex discontinued and Propofol substituted, PRN Fentanyl changed to Q 1 hour.        Thomasene Lot Tomeca Helm 02/07/2020, 3:24 AM

## 2020-02-07 NOTE — Progress Notes (Signed)
NAME:  Bryan [name], MRN:  [id number], DOB:  May 19, 1975, LOS: 13 ADMISSION DATE:  01/25/2020, CONSULTATION DATE:  01/25/20 REFERRING MD:  Claudette Laws CHIEF COMPLAINT:  AMS    Brief History   Bryan [name] is a 44 y.o. male admitted w/ DKA on 11/29.  PCCM consulted 11/30 for agitation and borderline hypotension. Patient later had PEA cardiac arrest, 2 rounds of CPR when ROSC achieved, patient intubated. Transferred to ICU, CVL and arterial line placed.   CT Head is unremarkable except for right sinus disease. CT Chest/Abdomen/Pelvis showed evidence of moderate acute pancreatitis, mild ascites and small bilateral pleural effusions. Lipase of 315U/L on 01/26/20. No initial clear etiology for pancreatitis, mother denies patient using alcohol, CT a/p negative for dilated biliary ducts or gall stones. Triglycerides found to be 600.    Past Medical History   Past Medical History:  Diagnosis Date  . Hypertension   . Psychiatric problem    Seen at [facility] for unknown reason. Takes seroquel. History of hearing voices.  Not commandivng voices.   . Schizophrenia (HCC)   . Tremors of nervous system     Significant Hospital Events   11/29 - admitted 11/30 - PEA Arrest and transfer to ICU  Consults:  PCCM  Procedures:  11/30 ETT 11/30 A line, removed 01/28/20 11/30 Left IJ CVL 11/30 NG tube placed 12/7 Left IJ CVL removed 12/8 Foley Catheter removed  Significant Diagnostic Tests:  CXR 11/29 - neg.  CT Head 12/1 - right sinus disease, no acute intracranial pathology CT Chest/Abd/Pel 12/1 -moderate pancreatis, mild asicites. Small bilateral pleural effusions Abd x-ray 12/1 - NG tube tip and side port in proximal stomach. No bowel obstruction or free air CXR 12/1 - No pneumothorax, developing pneumonia left lower lobe. Mild bibasilar atelectasis. RUQ [place] 12/1 - GB sludge, negative acute cholecystitis. Fatty liver. Small ascites/partially visualized right pleural effusion  TTE Echo 12/1 -  Gr I diastolic dysfunction LE [place] 12/1 - No evidence DVT in either LE  CXR 01/29/20 - Lines and tubes in stable position. New infiltrate right lung base. Persistent bibasilar atelectasis.   LUE [place] 01/29/20 - No evidence of DVT  EEG 12/4>>severe diffuse encephalopathy  MRI Brain 12/5>>No acute findings   CT Abdomen 12/6 - acute pancreatitis with interval decrease in peripancreatic and upper abdominal edema since prior exam. No pancreatic hemorrhage, mass or pancreatitis-associated fluid collection.  CXR 12/10>> Improvement of right sided infiltrates/atelectasis. Left pleural effusion improving  Micro Data:  11/30 Bld Cx - no growth  11/30 MRSA - negative 12/07 Trache Aspirate Cx - Abundant WBC, predom PMN. No growth  Antimicrobials:  01/26/2020 cefepime  01/26/2020 - d/c'd 12/1 vancomycin 02/02/2020 - DC cefepime. Vancomycin/meropenem started for 7 day course (end date 12/13) 02/06/2020 Vancomycin discontinued due to red rash and linezolid started  Interim history/subjective:  Patient agitated overnight and sedation changed from precedex to propofol and PRN fentanyl and versed. He got a total of 4mg  of versed overnight.   Red rash under neck, on both arms and medial thighs similar to yesterday  Objective   Blood pressure 116/67, pulse (!) 151, temperature (!) 100.6 F (38.1 C), temperature source Axillary, resp. rate (!) 28, height 5\' 10"  (1.778 m), weight 108.1 kg, SpO2 93 %.    Vent Mode: PRVC FiO2 (%):  [40 %-100 %] 40 % Set Rate:  [18 bmp] 18 bmp Vt Set:  [510 mL] 510 mL PEEP:  [5 cmH20] 5 cmH20 Pressure Support:  [5 cmH20] 5 cmH20 Plateau  Pressure:  [14 cmH20-44 cmH20] 14 cmH20   Intake/Output Summary (Last 24 hours) at 02/07/2020 1001 Last data filed at 02/07/2020 0500 Gross per 24 hour  Intake 1353.63 ml  Output 2750 ml  Net -1396.37 ml   Filed Weights   02/05/20 0500 02/06/20 0500 02/07/20 0405  Weight: 105.9 kg 106.7 kg 108.1 kg   Examination: General:  Critically ill-appearing, on vent support HENT: Mucous membranes moist. PERRL. Abrasion of upper lip, right eye with increased redness  Lungs: Bilateral mechanically ventilated breath sounds, no wheezing or rhonchi Cardiovascular: tachycardic, S1-S2 Abdomen: Distension improving. BS+ Extremities: SCD's in place. Upper extremities edematous. Erythema noted of left and right extremities as well as chest wall. GU: Condom cath in place.   Assessment & Plan:  Acute Hypoxemic Respiratory Failure Encephalopathy In setting of cardiac arrest, shock and pancreatitis. Encephalopathy remains an issue to extubation.  Plan: -maintain full vent support with SAT/SBT as tolerated -titrate Vent setting to maintain SpO2 greater than or equal to 90%. -HOB elevated 30 degrees. -Plateau pressures less than 30 cm H20.  -Bronchial hygiene and RT/bronchodilator protocol. - sedation with precedex and fentanyl boluses. - Patient may require tracheostomy if unable to extubate in the next few days   Severe Pancreatitis  Unclear etiology, Tg elevated upon admission. IgG negative. Mother denies alcohol and drug use. Imaging negative for biliary stricture or cholestasis. CT a/p 12/06 no pancreatic hemorrhage, mass, or fluid collection seen. Abdomen with active bowel sounds. KUB performed yesterday, revealed nonobstructive bowel gas pattern. Plan: -Improving  -Miralax BID -continue supportive care -tube feeds continued  Shock PEA Cardiac Arrest In setting of pancreatitis, DKA and severe metabolic derangements -off pressors at this time.   Leukocytosis Fever - Unknown etiology of leukocytosis at this time. - Treating with 7 days of antibiotics with meropenem and MRSA coverage (initially vancomycin and changed to linzeolid 12/11 due to rash). Antibiotics end date 12/13. - All central lines and foley removed  Diabetic Ketoacidosis, likely secondary to pancreatic inflammation New onset in setting of  pancreatitis. Off of insulin drip. Tube feeds in context of intubated state.  Plan: -Titrate SSI for goal CBG 140-180.  Hx of Schizophrenia Patient followed by [facility]. Current outpatient regimen of abilify monthly. Mother states patient missed November dosage.  - Seroquel 100 mg in the morning and 200 mg in the evening.  Right mid/lower opacity on Pulmonary Imaging Last CXR 02/05/20, improvement of right sided opacities Plan: -continue vancomycin and meropenem   Acute Kidney Injury Hypernatremia Hyperphosphatemia  Plan: - Free water changed to every 4 hours from every 2 hours today - Give lasix and metolazone this AM for hypernatremia and volume overload - Monitor renal function and electrolytes  Best practice:  Diet: TF Pain/Anxiety/Delirium protocol (if indicated): precedex, PRN fentanyl VAP protocol (if indicated): Ordered DVT prophylaxis: SCD's / Lovenox. GI prophylaxis: PPI Glucose control: Standard glycemic control Mobility: Bedrest. Last date of multidisciplinary goals of care discussion: Goals of care discussion with mother 12/10 Family and staff present: Nurse and doctor Summary of discussion: Patient is full code. Continue aggressive care measures. Follow up goals of care discussion due: 12/15 Code Status: Full  Labs   CBC: Recent Labs  Lab 02/04/20 0155 02/05/20 0023 02/06/20 0130 02/06/20 1119 02/07/20 0141 02/07/20 0404  WBC 30.1* 28.4* 24.2* 26.8* 26.0*  --   NEUTROABS  --   --   --  23.7*  --   --   HGB 10.0* 10.9* 9.4* 9.9* 10.4* 10.2*  HCT  30.2* 33.3* 28.1* 31.3* 31.1* 30.0*  MCV 93.8 93.8 95.3 95.1 93.4  --   PLT 235 215 230 239 223  --     Basic Metabolic Panel: Recent Labs  Lab 02/04/20 0155 02/05/20 0023 02/06/20 0130 02/06/20 1642 02/07/20 0141 02/07/20 0404  NA 148* 147* 146* 141 144 144  K 4.0 4.4 4.1 5.5* 5.2* 4.6  CL 108 108 107 106 105  --   CO2 25 25 26 23 24   --   GLUCOSE 159* 167* 132* 249* 161*  --   BUN 90* 96*  92* 82* 82*  --   CREATININE 2.30* 2.40* 2.17* 1.87* 2.03*  --   CALCIUM 9.3 9.1 9.2 9.2 9.5  --   MG  --  2.2 2.3  --  2.3  --   PHOS  --  5.1* 6.3* 4.6 5.5*  --    GFR: Estimated Creatinine Clearance: 57.7 mL/min (A) (by C-G formula based on SCr of 2.03 mg/dL (H)). Recent Labs  Lab 02/01/20 0819 02/02/20 0535 02/05/20 0023 02/06/20 0130 02/06/20 1119 02/07/20 0141  WBC  --    < > 28.4* 24.2* 26.8* 26.0*  LATICACIDVEN 1.4  --   --   --   --   --    < > = values in this interval not displayed.   Liver Function Tests: Recent Labs  Lab 02/03/20 0047 02/04/20 0155 02/05/20 0023 02/06/20 0130 02/06/20 1642 02/07/20 0141  AST 84* 86* 60* 44*  --  47*  ALT 65* 78* 69* 54*  --  48*  ALKPHOS 158* 204* 179* 150*  --  137*  BILITOT 0.8 0.5 0.5 0.5  --  0.7  PROT 6.0* 6.2* 6.2* 5.9*  --  6.6  ALBUMIN 1.5* 1.6* 1.8* 1.6* 1.8* 1.9*   No results for input(s): LIPASE, AMYLASE in the last 168 hours. No results for input(s): AMMONIA in the last 168 hours. ABG    Component Value Date/Time   PHART 7.452 (H) 02/07/2020 0404   PCO2ART 46.4 02/07/2020 0404   PO2ART 250 (H) 02/07/2020 0404   HCO3 32.1 (H) 02/07/2020 0404   TCO2 33 (H) 02/07/2020 0404   ACIDBASEDEF 5.0 (H) 01/26/2020 2111   O2SAT 100.0 02/07/2020 0404     Coagulation Profile: No results for input(s): INR, PROTIME in the last 168 hours.  Cardiac Enzymes: Recent Labs  Lab 02/02/20 1401  CKTOTAL 524*    HbA1C: Hgb A1c MFr Bld  Date/Time Value Ref Range Status  01/25/2020 05:40 PM 13.4 (H) 4.8 - 5.6 % Final    Comment:    (NOTE) Pre diabetes:          5.7%-6.4%  Diabetes:              >6.4%  Glycemic control for   <7.0% adults with diabetes     CBG: Recent Labs  Lab 02/06/20 1920 02/06/20 2220 02/06/20 2324 02/07/20 0319 02/07/20 0720  GLUCAP 220* 166* 144* 166* 212*    CC time 40 minutes  14/12/21, MD [facility] Office: [phone]   See Amion for Pager  Details

## 2020-02-07 NOTE — Progress Notes (Signed)
MD. Bryan [name] was notify of patient blisters noted in  left neck and upper back.

## 2020-02-08 DIAGNOSIS — E860 Dehydration: Secondary | ICD-10-CM

## 2020-02-08 DIAGNOSIS — N179 Acute kidney failure, unspecified: Secondary | ICD-10-CM

## 2020-02-08 LAB — CBC WITH DIFFERENTIAL/PLATELET
Abs Immature Granulocytes: 0.09 10*3/uL — ABNORMAL HIGH (ref 0.00–0.07)
Basophils Absolute: 0 10*3/uL (ref 0.0–0.1)
Basophils Relative: 0 %
Eosinophils Absolute: 0.1 10*3/uL (ref 0.0–0.5)
Eosinophils Relative: 0 %
HCT: 28.8 % — ABNORMAL LOW (ref 39.0–52.0)
Hemoglobin: 9.4 g/dL — ABNORMAL LOW (ref 13.0–17.0)
Immature Granulocytes: 0 %
Lymphocytes Relative: 8 %
Lymphs Abs: 1.6 10*3/uL (ref 0.7–4.0)
MCH: 31 pg (ref 26.0–34.0)
MCHC: 32.6 g/dL (ref 30.0–36.0)
MCV: 95 fL (ref 80.0–100.0)
Monocytes Absolute: 0.8 10*3/uL (ref 0.1–1.0)
Monocytes Relative: 4 %
Neutro Abs: 17.6 10*3/uL — ABNORMAL HIGH (ref 1.7–7.7)
Neutrophils Relative %: 88 %
Platelets: 253 10*3/uL (ref 150–400)
RBC: 3.03 MIL/uL — ABNORMAL LOW (ref 4.22–5.81)
RDW: 14.8 % (ref 11.5–15.5)
WBC: 20.2 10*3/uL — ABNORMAL HIGH (ref 4.0–10.5)
nRBC: 0.1 % (ref 0.0–0.2)

## 2020-02-08 LAB — GLUCOSE, CAPILLARY
Glucose-Capillary: 146 mg/dL — ABNORMAL HIGH (ref 70–99)
Glucose-Capillary: 162 mg/dL — ABNORMAL HIGH (ref 70–99)
Glucose-Capillary: 176 mg/dL — ABNORMAL HIGH (ref 70–99)
Glucose-Capillary: 185 mg/dL — ABNORMAL HIGH (ref 70–99)
Glucose-Capillary: 233 mg/dL — ABNORMAL HIGH (ref 70–99)
Glucose-Capillary: 235 mg/dL — ABNORMAL HIGH (ref 70–99)
Glucose-Capillary: 244 mg/dL — ABNORMAL HIGH (ref 70–99)

## 2020-02-08 LAB — COMPREHENSIVE METABOLIC PANEL
ALT: 44 U/L (ref 0–44)
AST: 40 U/L (ref 15–41)
Albumin: 2.1 g/dL — ABNORMAL LOW (ref 3.5–5.0)
Alkaline Phosphatase: 116 U/L (ref 38–126)
Anion gap: 13 (ref 5–15)
BUN: 68 mg/dL — ABNORMAL HIGH (ref 6–20)
CO2: 30 mmol/L (ref 22–32)
Calcium: 9.7 mg/dL (ref 8.9–10.3)
Chloride: 105 mmol/L (ref 98–111)
Creatinine, Ser: 1.69 mg/dL — ABNORMAL HIGH (ref 0.61–1.24)
GFR, Estimated: 51 mL/min — ABNORMAL LOW (ref >60)
Glucose, Bld: 279 mg/dL — ABNORMAL HIGH (ref 70–99)
Potassium: 4.3 mmol/L (ref 3.5–5.1)
Sodium: 148 mmol/L — ABNORMAL HIGH (ref 135–145)
Total Bilirubin: 0.7 mg/dL (ref 0.3–1.2)
Total Protein: 6.5 g/dL (ref 6.5–8.1)

## 2020-02-08 LAB — OSMOLALITY: Osmolality: 340 mOsm/kg (ref 275–295)

## 2020-02-08 LAB — SODIUM, URINE, RANDOM: Sodium, Ur: 78 mmol/L

## 2020-02-08 MED ORDER — ALBUMIN HUMAN 25 % IV SOLN
12.5000 g | Freq: Once | INTRAVENOUS | Status: AC
  Administered 2020-02-08: 03:00:00 12.5 g via INTRAVENOUS
  Filled 2020-02-08: qty 50

## 2020-02-08 NOTE — Progress Notes (Signed)
NAME:  Bryan [name], MRN:  [id number], DOB:  1975-06-15, LOS: 14 ADMISSION DATE:  01/25/2020, CONSULTATION DATE:  01/25/20 REFERRING MD:  Claudette Laws CHIEF COMPLAINT:  AMS    Brief History   Bryan [name] is a 44 y.o. male admitted w/ DKA on 11/29.  PCCM consulted 11/30 for agitation and borderline hypotension. Patient later had PEA cardiac arrest, 2 rounds of CPR when ROSC achieved, patient intubated. Transferred to ICU, CVL and arterial line placed.   CT Head is unremarkable except for right sinus disease. CT Chest/Abdomen/Pelvis showed evidence of moderate acute pancreatitis, mild ascites and small bilateral pleural effusions. Lipase of 315U/L on 01/26/20. No initial clear etiology for pancreatitis, mother denies patient using alcohol, CT a/p negative for dilated biliary ducts or gall stones. Triglycerides found to be 600.    Past Medical History   Past Medical History:  Diagnosis Date  . Hypertension   . Psychiatric problem    Seen at [facility] for unknown reason. Takes seroquel. History of hearing voices.  Not commandivng voices.   . Schizophrenia (HCC)   . Tremors of nervous system     Significant Hospital Events   11/29 - admitted 11/30 - PEA Arrest and transfer to ICU  Consults:  PCCM  Procedures:  11/30 ETT 11/30 A line, removed 01/28/20 11/30 Left IJ CVL 11/30 NG tube placed 12/7 Left IJ CVL removed 12/8 Foley Catheter removed  Significant Diagnostic Tests:  CXR 11/29 - neg.  CT Head 12/1 - right sinus disease, no acute intracranial pathology CT Chest/Abd/Pel 12/1 -moderate pancreatis, mild asicites. Small bilateral pleural effusions Abd x-ray 12/1 - NG tube tip and side port in proximal stomach. No bowel obstruction or free air CXR 12/1 - No pneumothorax, developing pneumonia left lower lobe. Mild bibasilar atelectasis. RUQ [place] 12/1 - GB sludge, negative acute cholecystitis. Fatty liver. Small ascites/partially visualized right pleural effusion  TTE Echo 12/1 -  Gr I diastolic dysfunction LE [place] 12/1 - No evidence DVT in either LE  CXR 01/29/20 - Lines and tubes in stable position. New infiltrate right lung base. Persistent bibasilar atelectasis.   LUE [place] 01/29/20 - No evidence of DVT  EEG 12/4>>severe diffuse encephalopathy  MRI Brain 12/5>>No acute findings   CT Abdomen 12/6 - acute pancreatitis with interval decrease in peripancreatic and upper abdominal edema since prior exam. No pancreatic hemorrhage, mass or pancreatitis-associated fluid collection.  KUB 12/09 Nonobstructive bowel gas pattern, enteric tube tip/side hole overlie the gastric body  CXR 12/10>> Improvement of right sided infiltrates/atelectasis. Left pleural effusion improving  Micro Data:  11/30 Bld Cx - no growth  11/30 MRSA - negative 12/07 Trache Aspirate Cx - Abundant WBC, predom PMN. No growth 12/12 Bld Cx x2 - No growth <24 hr  Antimicrobials:  01/26/2020 cefepime  01/26/2020 - d/c'd 12/1 vancomycin 02/02/2020 - DC cefepime. Vancomycin/meropenem started for 7 day course (end date 12/13) 02/06/2020 Vancomycin discontinued due to red rash and linezolid started 02/08/2020 Linezolid, Meropenem discontinued, red rash now with vesicles  Interim history/subjective:  Patient with tachycardia overnight, occasional stacking of breaths on ventilation noted. Patient given 25% albumin 12.5 gm, increased precedex ceiling to 1.6 mcg/kg/hr  Objective   Blood pressure (!) 153/96, pulse (!) 113, temperature 99.1 F (37.3 C), temperature source Oral, resp. rate 17, height 5\' 10"  (1.778 m), weight 107.2 kg, SpO2 99 %.    Vent Mode: PRVC FiO2 (%):  [40 %-60 %] 40 % Set Rate:  [18 bmp] 18 bmp Vt Set:  [510 mL]  510 mL PEEP:  [5 cmH20] 5 cmH20 Pressure Support:  [5 cmH20] 5 cmH20 Plateau Pressure:  [16 cmH20] 16 cmH20   Intake/Output Summary (Last 24 hours) at 02/08/2020 0646 Last data filed at 02/08/2020 0400 Gross per 24 hour  Intake 2474.95 ml  Output 4325 ml  Net -1850.05  ml   Filed Weights   02/06/20 0500 02/07/20 0405 02/08/20 0500  Weight: 106.7 kg 108.1 kg 107.2 kg   Examination: General: Critically ill-appearing, on vent support HENT: Mucous membranes dry. PERRL. Abrasion of upper lip, bandage in place. Bandage on left side of head. Lungs: Bilateral mechanically ventilated breath sounds, no wheezing or rhonchi Cardiovascular: tachycardic, S1-S2 Abdomen: Distension. BS+ Extremities: SCD's in place. Upper extremities edematous. Erythema noted of left and right extremities. Skin: Erythema improved on left chest wall and bilateral extremities, vesicles present on chest wall and bilateral extremities. Patient warm to touch GU: Condom cath in place.   Assessment & Plan:  Acute Hypoxemic Respiratory Failure Encephalopathy In setting of cardiac arrest, shock and pancreatitis. Encephalopathy remains an issue to extubation. Etiology of the encephalopathy suspected to be secondary to sedatives vs Hx of schizophrenia vs underlying infection. He continues to not follow commands and is agitated.  Plan: -continue Seroquel 100mg  morning and 200 mg in the evening. Consider increasing to 200 mg in the morning.  -continue sedation with precedex and fentanyl boluses. Wean as tolerable, considering this may be causing the patient to have this continuous encephalopathy -maintain full vent support with SAT/SBT as tolerated -titrate Vent setting to maintain SpO2 greater than or equal to 90%. -HOB elevated 30 degrees. -plateau pressures less than 30 cm H20.  -bronchial hygiene and RT/bronchodilator protocol. -patient day 12 of intubation, will consider tracheostomy and discuss further with my attending if this should be presented as an option to the family.    Severe Pancreatitis  Unclear etiology, Tg elevated upon admission. IgG negative. Mother denies alcohol and drug use. Imaging negative for biliary stricture or cholestasis. CT a/p 12/06 no pancreatic hemorrhage, mass,  or fluid collection seen. Abdomen with active bowel sounds. KUB performed 12/09, revealed nonobstructive bowel gas pattern. Plan: -improving  -miralax BID -continue supportive care -tube feeds continued  Shock PEA Cardiac Arrest In setting of pancreatitis, DKA and severe metabolic derangements Plan: -off pressors at this time, will continue to monitor.   Leukocytosis Fever Erythematous Rash unknown etiology of leukocytosis at this time. Improving wbc yesterday, pending CBC today. New rash and vesicles may be contributing to leukocytosis, but negative for eosinophilia.  Plan: -was being treated for 7 day course of antibiotics with meropenem and MRSA coverage (initially vancomycin and changed to linzeolid 12/11 due to rash). Yesterday patient found to have vesicular rash over areas of erythema. All antibiotics were discontinued suspecting a possible drug reaction.  -continue to trend WBC and will consider ID consult. -all central lines and foley removed  Diabetic Ketoacidosis, likely secondary to pancreatic inflammation New onset in setting of pancreatitis. Off of insulin drip. Tube feeds in context of intubated state.  Plan: -titrate SSI for goal CBG 140-180.  Hx of Schizophrenia Patient followed by [facility]. Current outpatient regimen of abilify monthly. Mother states patient missed November dosage.  Plan: -seroquel 100 mg in the morning and 200 mg in the evening. -consider increasing as agitation may be contributing to patient's tachycardia when off of sedatives.   Right mid/lower opacity on Pulmonary Imaging Last CXR 02/05/20, improvement of right sided opacities Plan: -discontinued antibiotics at this time due  to suspected drug reaction -will continue to monitor for worsening of infection, trending wbc and daily pulmonary exams.    Acute Kidney Injury Hypernatremia Hyperphosphatemia CMP pending. Plan: -free water 200mL changed to every 4 hours, subject to change pending  CMP -received lasix and metolazone this AM for hypernatremia and volume overload - Monitor renal function and electrolytes  Best practice:  Diet: TF Pain/Anxiety/Delirium protocol (if indicated): precedex, PRN fentanyl VAP protocol (if indicated): Ordered DVT prophylaxis: SCD's / Lovenox. GI prophylaxis: PPI Glucose control: Standard glycemic control Mobility: Bedrest. Last date of multidisciplinary goals of care discussion: Goals of care discussion with mother 12/10 Family and staff present: Nurse and doctor Summary of discussion: Patient is full code. Continue aggressive care measures. Follow up goals of care discussion due: 12/15 Code Status: Full  Labs   CBC: Recent Labs  Lab 02/05/20 0023 02/06/20 0130 02/06/20 1119 02/07/20 0141 02/07/20 0404 02/07/20 1817  WBC 28.4* 24.2* 26.8* 26.0*  --  22.7*  NEUTROABS  --   --  23.7*  --   --  20.0*  HGB 10.9* 9.4* 9.9* 10.4* 10.2* 9.7*  HCT 33.3* 28.1* 31.3* 31.1* 30.0* 29.1*  MCV 93.8 95.3 95.1 93.4  --  93.9  PLT 215 230 239 223  --  263    Basic Metabolic Panel: Recent Labs  Lab 02/04/20 0155 02/05/20 0023 02/06/20 0130 02/06/20 1642 02/07/20 0141 02/07/20 0404  NA 148* 147* 146* 141 144 144  K 4.0 4.4 4.1 5.5* 5.2* 4.6  CL 108 108 107 106 105  --   CO2 25 25 26 23 24   --   GLUCOSE 159* 167* 132* 249* 161*  --   BUN 90* 96* 92* 82* 82*  --   CREATININE 2.30* 2.40* 2.17* 1.87* 2.03*  --   CALCIUM 9.3 9.1 9.2 9.2 9.5  --   MG  --  2.2 2.3  --  2.3  --   PHOS  --  5.1* 6.3* 4.6 5.5*  --    GFR: Estimated Creatinine Clearance: 57.5 mL/min (A) (by C-G formula based on SCr of 2.03 mg/dL (H)). Recent Labs  Lab 02/01/20 0819 02/02/20 0535 02/06/20 0130 02/06/20 1119 02/07/20 0141 02/07/20 1817  WBC  --    < > 24.2* 26.8* 26.0* 22.7*  LATICACIDVEN 1.4  --   --   --   --   --    < > = values in this interval not displayed.   Liver Function Tests: Recent Labs  Lab 02/03/20 0047 02/04/20 0155 02/05/20 0023  02/06/20 0130 02/06/20 1642 02/07/20 0141  AST 84* 86* 60* 44*  --  47*  ALT 65* 78* 69* 54*  --  48*  ALKPHOS 158* 204* 179* 150*  --  137*  BILITOT 0.8 0.5 0.5 0.5  --  0.7  PROT 6.0* 6.2* 6.2* 5.9*  --  6.6  ALBUMIN 1.5* 1.6* 1.8* 1.6* 1.8* 1.9*   No results for input(s): LIPASE, AMYLASE in the last 168 hours. No results for input(s): AMMONIA in the last 168 hours. ABG    Component Value Date/Time   PHART 7.452 (H) 02/07/2020 0404   PCO2ART 46.4 02/07/2020 0404   PO2ART 250 (H) 02/07/2020 0404   HCO3 32.1 (H) 02/07/2020 0404   TCO2 33 (H) 02/07/2020 0404   ACIDBASEDEF 5.0 (H) 01/26/2020 2111   O2SAT 100.0 02/07/2020 0404     Coagulation Profile: No results for input(s): INR, PROTIME in the last 168 hours.  Cardiac Enzymes: Recent Labs  Lab 02/02/20 1401  CKTOTAL 524*    HbA1C: Hgb A1c MFr Bld  Date/Time Value Ref Range Status  01/25/2020 05:40 PM 13.4 (H) 4.8 - 5.6 % Final    Comment:    (NOTE) Pre diabetes:          5.7%-6.4%  Diabetes:              >6.4%  Glycemic control for   <7.0% adults with diabetes     CBG: Recent Labs  Lab 02/07/20 1131 02/07/20 1608 02/07/20 2140 02/08/20 0035 02/08/20 0439  GLUCAP 358* 275* 254* 235* 162*    Vasili Kashmere Daywalt Internal Medicine Resident PGY-1 [facility]

## 2020-02-08 NOTE — Progress Notes (Signed)
eLink Physician-Brief Progress Note Patient Name: Bryan [name] DOB: [birth year] MRN: [id number]   Date of Service  02/08/2020  HPI/Events of Note  Nursing concern for HR = 126 (sinus tachycardia). Etiology? Recent tracheal aspirate culture: Rate WBC and no organisms. Albumin = 1.9. Patient occasionally stacking breaths on mechanical ventilation.   eICU Interventions  Plan: 1. 25% Albumin 12.5 gm IV now.  2. Increase ceiling on Precedex IV infusion to 1.6 mcg/kg/hour.      Intervention Category Major Interventions: Other:  Lenell Antu 02/08/2020, 2:29 AM

## 2020-02-08 NOTE — Plan of Care (Signed)
Spoke with the above with patient's mother.

## 2020-02-08 NOTE — Progress Notes (Signed)
eLink Physician-Brief Progress Note Patient Name: Bryan [name] DOB: [birth year] MRN: [id number]   Date of Service  02/08/2020  HPI/Events of Note  Nursing request for AM labs.   eICU Interventions  Will order CBC with platelets and CMP at 5 AM.     Intervention Category Major Interventions: Other:  Lenell Antu 02/08/2020, 4:56 AM

## 2020-02-08 NOTE — Progress Notes (Signed)
CRITICAL VALUE ALERT  Critical Value:  Serum Osmolality  Date & Time Notied:  02/08/2020 4:48 PM   Provider Notified: Yes  Orders Received/Actions taken: Awaiting orders

## 2020-02-09 ENCOUNTER — Encounter (HOSPITAL_COMMUNITY): Payer: Self-pay | Admitting: Internal Medicine

## 2020-02-09 ENCOUNTER — Inpatient Hospital Stay (HOSPITAL_COMMUNITY): Payer: Medicaid Other

## 2020-02-09 LAB — CBC WITH DIFFERENTIAL/PLATELET
Abs Immature Granulocytes: 0.04 10*3/uL (ref 0.00–0.07)
Basophils Absolute: 0 10*3/uL (ref 0.0–0.1)
Basophils Relative: 0 %
Eosinophils Absolute: 0.1 10*3/uL (ref 0.0–0.5)
Eosinophils Relative: 1 %
HCT: 27.2 % — ABNORMAL LOW (ref 39.0–52.0)
Hemoglobin: 8.9 g/dL — ABNORMAL LOW (ref 13.0–17.0)
Immature Granulocytes: 0 %
Lymphocytes Relative: 12 %
Lymphs Abs: 1.6 10*3/uL (ref 0.7–4.0)
MCH: 31.2 pg (ref 26.0–34.0)
MCHC: 32.7 g/dL (ref 30.0–36.0)
MCV: 95.4 fL (ref 80.0–100.0)
Monocytes Absolute: 0.6 10*3/uL (ref 0.1–1.0)
Monocytes Relative: 5 %
Neutro Abs: 11.2 10*3/uL — ABNORMAL HIGH (ref 1.7–7.7)
Neutrophils Relative %: 82 %
Platelets: 260 10*3/uL (ref 150–400)
RBC: 2.85 MIL/uL — ABNORMAL LOW (ref 4.22–5.81)
RDW: 14.6 % (ref 11.5–15.5)
WBC: 13.7 10*3/uL — ABNORMAL HIGH (ref 4.0–10.5)
nRBC: 0 % (ref 0.0–0.2)

## 2020-02-09 LAB — OSMOLALITY, URINE: Osmolality, Ur: 580 mOsm/kg (ref 300–900)

## 2020-02-09 LAB — COMPREHENSIVE METABOLIC PANEL
ALT: 48 U/L — ABNORMAL HIGH (ref 0–44)
AST: 48 U/L — ABNORMAL HIGH (ref 15–41)
Albumin: 2.1 g/dL — ABNORMAL LOW (ref 3.5–5.0)
Alkaline Phosphatase: 116 U/L (ref 38–126)
Anion gap: 11 (ref 5–15)
BUN: 63 mg/dL — ABNORMAL HIGH (ref 6–20)
CO2: 29 mmol/L (ref 22–32)
Calcium: 9.6 mg/dL (ref 8.9–10.3)
Chloride: 109 mmol/L (ref 98–111)
Creatinine, Ser: 1.64 mg/dL — ABNORMAL HIGH (ref 0.61–1.24)
GFR, Estimated: 53 mL/min — ABNORMAL LOW (ref >60)
Glucose, Bld: 143 mg/dL — ABNORMAL HIGH (ref 70–99)
Potassium: 4.1 mmol/L (ref 3.5–5.1)
Sodium: 149 mmol/L — ABNORMAL HIGH (ref 135–145)
Total Bilirubin: 0.6 mg/dL (ref 0.3–1.2)
Total Protein: 6.3 g/dL — ABNORMAL LOW (ref 6.5–8.1)

## 2020-02-09 LAB — GLUCOSE, CAPILLARY
Glucose-Capillary: 234 mg/dL — ABNORMAL HIGH (ref 70–99)
Glucose-Capillary: 125 mg/dL — ABNORMAL HIGH (ref 70–99)
Glucose-Capillary: 127 mg/dL — ABNORMAL HIGH (ref 70–99)
Glucose-Capillary: 151 mg/dL — ABNORMAL HIGH (ref 70–99)
Glucose-Capillary: 133 mg/dL — ABNORMAL HIGH (ref 70–99)
Glucose-Capillary: 158 mg/dL — ABNORMAL HIGH (ref 70–99)
Glucose-Capillary: 94 mg/dL (ref 70–99)

## 2020-02-09 LAB — OSMOLALITY: Osmolality: 338 mOsm/kg (ref 275–295)

## 2020-02-09 LAB — CULTURE, RESPIRATORY W GRAM STAIN

## 2020-02-09 MED ORDER — QUETIAPINE FUMARATE 100 MG PO TABS
100.0000 mg | ORAL_TABLET | Freq: Once | ORAL | Status: AC
  Administered 2020-02-09: 11:00:00 100 mg
  Filled 2020-02-09: qty 1

## 2020-02-09 MED ORDER — FREE WATER
300.0000 mL | Status: DC
  Administered 2020-02-09 – 2020-02-10 (×6): 300 mL

## 2020-02-09 MED ORDER — GERHARDT'S BUTT CREAM
TOPICAL_CREAM | Freq: Two times a day (BID) | CUTANEOUS | Status: DC
  Administered 2020-02-09 – 2020-02-12 (×2): 1 via TOPICAL
  Administered 2020-02-16: 2 via TOPICAL
  Administered 2020-02-21 – 2020-03-12 (×7): 1 via TOPICAL
  Filled 2020-02-09: qty 1

## 2020-02-09 MED ORDER — QUETIAPINE FUMARATE 100 MG PO TABS
200.0000 mg | ORAL_TABLET | Freq: Every day | ORAL | Status: DC
  Administered 2020-02-09 – 2020-02-10 (×2): 200 mg
  Filled 2020-02-09 (×2): qty 2

## 2020-02-09 MED ORDER — INSULIN ASPART 100 UNIT/ML ~~LOC~~ SOLN
8.0000 [IU] | SUBCUTANEOUS | Status: DC
  Administered 2020-02-09 – 2020-03-04 (×117): 8 [IU] via SUBCUTANEOUS

## 2020-02-09 MED ORDER — PANTOPRAZOLE SODIUM 40 MG PO PACK
40.0000 mg | PACK | Freq: Every day | ORAL | Status: DC
  Administered 2020-02-09 – 2020-03-02 (×22): 40 mg
  Filled 2020-02-09 (×23): qty 20

## 2020-02-09 MED ORDER — QUETIAPINE FUMARATE 100 MG PO TABS
200.0000 mg | ORAL_TABLET | Freq: Every morning | ORAL | Status: DC
  Administered 2020-02-10: 09:00:00 200 mg
  Filled 2020-02-09: qty 2

## 2020-02-09 MED ORDER — QUETIAPINE FUMARATE 100 MG PO TABS: 100.0000 mg | ORAL_TABLET | Freq: Once | ORAL | Status: DC

## 2020-02-09 NOTE — Consult Note (Signed)
WOC Nurse Consult Note: Patient receiving care in [facility] 2M01 Consult completed remotely after chart review and [place] with bedside nurse (April) Reason for Consult: skin tears on buttocks. MASD around groin and bottom Wound type: MASD/IAD has flexiseal Dressing procedure/placement/frequency: Apply Gerhardt's butt cream to MASD/IAD after cleansing the area with no rinse cleanser. Apply BID or PRN.  Monitor the wound area(s) for worsening of condition such as: Signs/symptoms of infection, increase in size, development of or worsening of odor, development of pain, or increased pain at the affected locations.   Notify the medical team if any of these develop.  Thank you for the consult. WOC nurse will not follow at this time.   Please re-consult the WOC team if needed.  Renaldo Reel Katrinka Blazing, MSN, RN, CMSRN, Angus Seller, [facility] Wound Treatment Associate Pager [phone]

## 2020-02-09 NOTE — Progress Notes (Addendum)
NAME:  Bryan [name], MRN:  [id number], DOB:  Apr 29, 1975, LOS: 15 ADMISSION DATE:  01/25/2020, CONSULTATION DATE:  01/25/20 REFERRING MD:  Claudette Laws CHIEF COMPLAINT:  AMS    Brief History   Bryan [name] is a 44 y.o. male admitted w/ DKA on 11/29.  PCCM consulted 11/30 for agitation and borderline hypotension. Patient later had PEA cardiac arrest, 2 rounds of CPR when ROSC achieved, patient intubated. Transferred to ICU, CVL and arterial line placed.   CT Head is unremarkable except for right sinus disease. CT Chest/Abdomen/Pelvis showed evidence of moderate acute pancreatitis, mild ascites and small bilateral pleural effusions. Lipase of 315U/L on 01/26/20. No initial clear etiology for pancreatitis, mother denies patient using alcohol, CT a/p negative for dilated biliary ducts or gall stones. Triglycerides found to be 600.    Past Medical History   Past Medical History:  Diagnosis Date  . Hypertension   . Psychiatric problem    Seen at [facility] for unknown reason. Takes seroquel. History of hearing voices.  Not commandivng voices.   . Schizophrenia (HCC)   . Tremors of nervous system     Significant Hospital Events   11/29 - admitted 11/30 - PEA Arrest and transfer to ICU  Consults:  PCCM  Procedures:  11/30 ETT 11/30 A line, removed 01/28/20 11/30 Left IJ CVL 11/30 NG tube placed 12/7 Left IJ CVL removed 12/8 Foley Catheter removed  Significant Diagnostic Tests:  CXR 11/29 - neg.  CT Head 12/1 - right sinus disease, no acute intracranial pathology CT Chest/Abd/Pel 12/1 -moderate pancreatis, mild asicites. Small bilateral pleural effusions Abd x-ray 12/1 - NG tube tip and side port in proximal stomach. No bowel obstruction or free air CXR 12/1 - No pneumothorax, developing pneumonia left lower lobe. Mild bibasilar atelectasis. RUQ [place] 12/1 - GB sludge, negative acute cholecystitis. Fatty liver. Small ascites/partially visualized right pleural effusion  TTE Echo 12/1 -  Gr I diastolic dysfunction LE [place] 12/1 - No evidence DVT in either LE  CXR 01/29/20 - Lines and tubes in stable position. New infiltrate right lung base. Persistent bibasilar atelectasis.   LUE [place] 01/29/20 - No evidence of DVT  EEG 12/4>>severe diffuse encephalopathy  MRI Brain 12/5>>No acute findings   CT Abdomen 12/6 - acute pancreatitis with interval decrease in peripancreatic and upper abdominal edema since prior exam. No pancreatic hemorrhage, mass or pancreatitis-associated fluid collection.  KUB 12/09 Nonobstructive bowel gas pattern, enteric tube tip/side hole overlie the gastric body  CXR 12/10>> Improvement of right sided infiltrates/atelectasis. Left pleural effusion improving  CXR 12/14>> Mild bilateral infiltrates unchanged. No new findings. ET tube 8 cm above carina  Micro Data:  11/30 Bld Cx - no growth  11/30 MRSA - negative 12/07 Trache Aspirate Cx - Abundant WBC, predom PMN. No growth 12/12 Bld Cx x2 - No growth 2 days  Antimicrobials:  01/26/2020 cefepime  01/26/2020 - d/c'd 12/1 vancomycin 02/02/2020 - DC cefepime. Vancomycin/meropenem started for 7 day course (end date 12/13) 02/06/2020 Vancomycin discontinued due to red rash and linezolid started 02/08/2020 Linezolid, Meropenem discontinued, red rash now with vesicles  Interim history/subjective:  Patient with improvement of chest/extremity rash, secondary to suspected drug reaction. All abx discontinued 12/13. Nursing note patient with intermittent fevers overnight. Nursing note skin flaking on buttocks region  Objective   Blood pressure 122/72, pulse 89, temperature (!) 100.9 F (38.3 C), temperature source Oral, resp. rate 20, height 5\' 10"  (1.778 m), weight 105.6 kg, SpO2 96 %.    Vent Mode:  PRVC FiO2 (%):  [40 %] 40 % Set Rate:  [18 bmp] 18 bmp Vt Set:  [510 mL] 510 mL PEEP:  [5 cmH20] 5 cmH20 Pressure Support:  [10 cmH20] 10 cmH20 Plateau Pressure:  [12 cmH20-14 cmH20] 14 cmH20   Intake/Output  Summary (Last 24 hours) at 02/09/2020 0732 Last data filed at 02/09/2020 0600 Gross per 24 hour  Intake 2582.93 ml  Output 4912.5 ml  Net -2329.57 ml   Filed Weights   02/07/20 0405 02/08/20 0500 02/09/20 0500  Weight: 108.1 kg 107.2 kg 105.6 kg   Examination: General: Critically ill-appearing, on vent support HENT: Mucous membranes dry. PERRL. Abrasion of upper lip, bandage in place. Bandage on left side of head. Lungs: Bilateral mechanically ventilated breath sounds, no wheezing or rhonchi apprecaited Cardiovascular: tachycardic, S1-S2 Abdomen: Distension. BS+ Extremities: SCD's in place. Upper extremities edematous. Erythema noted of left and right extremities. Skin: Erythema improved on left chest wall and bilateral extremities, vesicles improved on chest wall/extremities. One bullae on left upper extremity. GU: Condom cath in place.   Assessment & Plan:  Acute Hypoxemic Respiratory Failure Encephalopathy In setting of cardiac arrest, shock and pancreatitis. Encephalopathy remains an issue to extubation. Suspect secondary to psychiatric history.  Missed last months abilify injection per patient's mother. WBC improving, do not suspect infection is main underlying cause. Last head imaging negative for any acute pathology. Patient responds to pain, but does not follow commands. Have not witnessed patient moving upper extremities, no reason to suspect cervical injury at this time. Plan: -patient day 13 of intubation, will discuss with mother possibility of tracheostomy placement if patient does not improve over next few days and fails trial extubation.  -continue Seroquel 100mg  morning and 200 mg in the evening. Consider increasing doses -continue sedation with precedex and fentanyl boluses. Wean as tolerable, considering this may be causing the patient to have this continuous encephalopathy -maintain full vent support with SAT/SBT as tolerated -titrate Vent setting to maintain SpO2  greater than or equal to 90%. -HOB elevated 30 degrees. -plateau pressures less than 30 cm H20.  -bronchial hygiene and RT/bronchodilator protocol.   Severe Pancreatitis  Unclear etiology, Tg elevated upon admission. IgG negative. Mother denies alcohol and drug use. Imaging negative for biliary stricture or cholestasis. CT a/p 12/06 no pancreatic hemorrhage, mass, or fluid collection seen. Abdomen with active bowel sounds. KUB performed 12/09, revealed nonobstructive bowel gas pattern. Plan: -improving  -miralax BID -continue supportive care -tube feeds continued  Shock PEA Cardiac Arrest In setting of pancreatitis, DKA and severe metabolic derangements Plan: -off pressors at this time, will continue to monitor.   Leukocytosis Fever Erythematous Rash Unknown etiology of leukocytosis. WBC continues to improve. Rash has improved, patient has on bullae on left upper extremity. DC antibiotics 12/13. Will restart if patient has increasing signs of infection.  Plan: -was being treated for 7 day course of antibiotics with meropenem and MRSA coverage (initially vancomycin and changed to linzeolid 12/11 due to rash). Patient found to have vesicular rash over areas of erythema. All antibiotics were discontinued suspecting a possible drug reaction.  -continue to trend WBC, consider restarting antibiotics if WBC increases -all central lines and foley removed  Diabetic Ketoacidosis, likely secondary to pancreatic inflammation New onset in setting of pancreatitis. Off of insulin drip. Tube feeds in context of intubated state.  Plan: -titrate SSI for goal CBG 140-180.  Hx of Schizophrenia Patient followed by [facility]. Current outpatient regimen of abilify monthly. Mother states patient missed November dosage.  Plan: -seroquel 100 mg in the morning and 200 mg in the evening. -consider increasing as agitation may be contributing to patient's tachycardia when off of sedatives.   Bilateral  Pulmonary Infiltrates Appear unchanged from prior imaging.  Plan: -discontinued antibiotics at this time due to suspected drug reaction -will continue to monitor for worsening of infection, trending wbc and daily pulmonary exams.    Acute Kidney Injury Hypernatremia Hyperphosphatemia AKI improving. Continues to have hypernatremia. Nursing note increasing amounts of urination. Serum osmolality of 340. Urine sodium of 78. Urine Osmol of 590. Osm gap of 12.0. Urine osmo is normal, do not suspect diabetes insipidus at this time. Gap minimally elevated. Patient has been admitted for 13 days, do not suspect any type of alcohol ingestion would still be lingering. Patient is meeting tube feed goals. Will continue to monitor.  Plan: -free water increased to 300 ml.  -monitor renal function and electrolytes  Buttock Wound Nursing note flaking of skin around buttocks Plan: -wound care consulted   Best practice:  Diet: TF Pain/Anxiety/Delirium protocol (if indicated): precedex, PRN fentanyl VAP protocol (if indicated): Ordered DVT prophylaxis: SCD's / Lovenox. GI prophylaxis: PPI Glucose control: Standard glycemic control Mobility: Bedrest. Last date of multidisciplinary goals of care discussion: Goals of care discussion with mother 12/10  Family and staff present: Nurse and doctor Summary of discussion: Patient is full code. Continue aggressive care measures. Follow up goals of care discussion due: Will discuss trache placement today with mother Code Status: Full  Labs   CBC: Recent Labs  Lab 02/06/20 1119 02/07/20 0141 02/07/20 0404 02/07/20 1817 02/08/20 1146 02/09/20 0048  WBC 26.8* 26.0*  --  22.7* 20.2* 13.7*  NEUTROABS 23.7*  --   --  20.0* 17.6* 11.2*  HGB 9.9* 10.4* 10.2* 9.7* 9.4* 8.9*  HCT 31.3* 31.1* 30.0* 29.1* 28.8* 27.2*  MCV 95.1 93.4  --  93.9 95.0 95.4  PLT 239 223  --  263 253 260    Basic Metabolic Panel: Recent Labs  Lab 02/05/20 0023 02/06/20 0130  02/06/20 1642 02/07/20 0141 02/07/20 0404 02/08/20 1146  NA 147* 146* 141 144 144 148*  K 4.4 4.1 5.5* 5.2* 4.6 4.3  CL 108 107 106 105  --  105  CO2 25 26 23 24   --  30  GLUCOSE 167* 132* 249* 161*  --  279*  BUN 96* 92* 82* 82*  --  68*  CREATININE 2.40* 2.17* 1.87* 2.03*  --  1.69*  CALCIUM 9.1 9.2 9.2 9.5  --  9.7  MG 2.2 2.3  --  2.3  --   --   PHOS 5.1* 6.3* 4.6 5.5*  --   --    GFR: Estimated Creatinine Clearance: 68.6 mL/min (A) (by C-G formula based on SCr of 1.69 mg/dL (H)). Recent Labs  Lab 02/07/20 0141 02/07/20 1817 02/08/20 1146 02/09/20 0048  WBC 26.0* 22.7* 20.2* 13.7*   Liver Function Tests: Recent Labs  Lab 02/04/20 0155 02/05/20 0023 02/06/20 0130 02/06/20 1642 02/07/20 0141 02/08/20 1146  AST 86* 60* 44*  --  47* 40  ALT 78* 69* 54*  --  48* 44  ALKPHOS 204* 179* 150*  --  137* 116  BILITOT 0.5 0.5 0.5  --  0.7 0.7  PROT 6.2* 6.2* 5.9*  --  6.6 6.5  ALBUMIN 1.6* 1.8* 1.6* 1.8* 1.9* 2.1*   No results for input(s): LIPASE, AMYLASE in the last 168 hours. No results for input(s): AMMONIA in the last 168 hours.  ABG    Component Value Date/Time   PHART 7.452 (H) 02/07/2020 0404   PCO2ART 46.4 02/07/2020 0404   PO2ART 250 (H) 02/07/2020 0404   HCO3 32.1 (H) 02/07/2020 0404   TCO2 33 (H) 02/07/2020 0404   ACIDBASEDEF 5.0 (H) 01/26/2020 2111   O2SAT 100.0 02/07/2020 0404     Coagulation Profile: No results for input(s): INR, PROTIME in the last 168 hours.  Cardiac Enzymes: Recent Labs  Lab 02/02/20 1401  CKTOTAL 524*    HbA1C: Hgb A1c MFr Bld  Date/Time Value Ref Range Status  01/25/2020 05:40 PM 13.4 (H) 4.8 - 5.6 % Final    Comment:    (NOTE) Pre diabetes:          5.7%-6.4%  Diabetes:              >6.4%  Glycemic control for   <7.0% adults with diabetes     CBG: Recent Labs  Lab 02/08/20 1927 02/08/20 2309 02/09/20 0314 02/09/20 0723 02/09/20 0724  GLUCAP 146* 233* 234* 127* 125*    Vasili Rolf Fells Internal  Medicine Resident PGY-1 [facility]

## 2020-02-09 NOTE — Progress Notes (Signed)
eLink Physician-Brief Progress Note Patient Name: Bryan [name] DOB: [birth year] MRN: [id number]   Date of Service  02/09/2020  HPI/Events of Note  Serum Osmolarity = 340 --> 338. Central DI not suspected earlier today. Na+ = 149. Free water per tube increased earlier today.   eICU Interventions  Continue present management.      Intervention Category Major Interventions: Other:  Lenell Antu 02/09/2020, 8:40 PM

## 2020-02-10 DIAGNOSIS — G928 Other toxic encephalopathy: Secondary | ICD-10-CM

## 2020-02-10 LAB — COMPREHENSIVE METABOLIC PANEL
ALT: 46 U/L — ABNORMAL HIGH (ref 0–44)
AST: 41 U/L (ref 15–41)
Albumin: 1.9 g/dL — ABNORMAL LOW (ref 3.5–5.0)
Alkaline Phosphatase: 106 U/L (ref 38–126)
Anion gap: 10 (ref 5–15)
BUN: 58 mg/dL — ABNORMAL HIGH (ref 6–20)
CO2: 30 mmol/L (ref 22–32)
Calcium: 9.7 mg/dL (ref 8.9–10.3)
Chloride: 109 mmol/L (ref 98–111)
Creatinine, Ser: 1.31 mg/dL — ABNORMAL HIGH (ref 0.61–1.24)
GFR, Estimated: >60 mL/min (ref >60)
Glucose, Bld: 134 mg/dL — ABNORMAL HIGH (ref 70–99)
Potassium: 3.5 mmol/L (ref 3.5–5.1)
Sodium: 149 mmol/L — ABNORMAL HIGH (ref 135–145)
Total Bilirubin: 1 mg/dL (ref 0.3–1.2)
Total Protein: 6 g/dL — ABNORMAL LOW (ref 6.5–8.1)

## 2020-02-10 LAB — CBC
HCT: 27.4 % — ABNORMAL LOW (ref 39.0–52.0)
Hemoglobin: 8.4 g/dL — ABNORMAL LOW (ref 13.0–17.0)
MCH: 30 pg (ref 26.0–34.0)
MCHC: 30.7 g/dL (ref 30.0–36.0)
MCV: 97.9 fL (ref 80.0–100.0)
Platelets: 247 10*3/uL (ref 150–400)
RBC: 2.8 MIL/uL — ABNORMAL LOW (ref 4.22–5.81)
RDW: 14.4 % (ref 11.5–15.5)
WBC: 11.1 10*3/uL — ABNORMAL HIGH (ref 4.0–10.5)
nRBC: 0 % (ref 0.0–0.2)

## 2020-02-10 LAB — GLUCOSE, CAPILLARY
Glucose-Capillary: 48 mg/dL — ABNORMAL LOW (ref 70–99)
Glucose-Capillary: 143 mg/dL — ABNORMAL HIGH (ref 70–99)
Glucose-Capillary: 95 mg/dL (ref 70–99)
Glucose-Capillary: 128 mg/dL — ABNORMAL HIGH (ref 70–99)
Glucose-Capillary: 94 mg/dL (ref 70–99)
Glucose-Capillary: 85 mg/dL (ref 70–99)
Glucose-Capillary: 95 mg/dL (ref 70–99)

## 2020-02-10 LAB — TRIGLYCERIDES: Triglycerides: 247 mg/dL — ABNORMAL HIGH (ref <150)

## 2020-02-10 MED ORDER — MIDAZOLAM HCL 2 MG/2ML IJ SOLN
1.0000 mg | INTRAMUSCULAR | Status: DC | PRN
  Administered 2020-02-10: 23:00:00 1 mg via INTRAVENOUS
  Filled 2020-02-10: qty 2

## 2020-02-10 MED ORDER — CLONIDINE HCL 0.2 MG PO TABS
0.1000 mg | ORAL_TABLET | Freq: Two times a day (BID) | ORAL | Status: DC
Start: 2020-02-10 — End: 2020-02-10

## 2020-02-10 MED ORDER — MIDAZOLAM HCL 2 MG/2ML IJ SOLN
INTRAMUSCULAR | Status: AC
  Administered 2020-02-10: 18:00:00 1 mg
  Filled 2020-02-10: qty 2

## 2020-02-10 MED ORDER — CLONIDINE HCL 0.2 MG PO TABS: 0.1000 mg | ORAL_TABLET | Freq: Two times a day (BID) | ORAL | Status: DC

## 2020-02-10 MED ORDER — POTASSIUM CHLORIDE 20 MEQ PO PACK
40.0000 meq | PACK | Freq: Once | ORAL | Status: AC
  Administered 2020-02-10: 06:00:00 40 meq
  Filled 2020-02-10: qty 2

## 2020-02-10 MED ORDER — LORAZEPAM 2 MG/ML IJ SOLN: 1.0000 mg | Freq: Four times a day (QID) | INTRAMUSCULAR | Status: DC | PRN

## 2020-02-10 MED ORDER — FREE WATER
200.0000 mL | Status: DC
  Administered 2020-02-10 – 2020-02-14 (×45): 200 mL

## 2020-02-10 MED ORDER — CLONIDINE HCL 0.3 MG PO TABS
0.3000 mg | ORAL_TABLET | Freq: Four times a day (QID) | ORAL | Status: DC
  Administered 2020-02-10 – 2020-02-11 (×4): 0.3 mg
  Filled 2020-02-10 (×4): qty 1

## 2020-02-10 NOTE — Progress Notes (Signed)
NAME:  Bryan [name], MRN:  [id number], DOB:  12-24-1975, LOS: 16 ADMISSION DATE:  01/25/2020, CONSULTATION DATE:  01/25/20 REFERRING MD:  Claudette Laws CHIEF COMPLAINT:  AMS    Brief History   Bryan [name] is a 44 y.o. male admitted w/ DKA on 11/29.  PCCM consulted 11/30 for agitation and borderline hypotension. Patient later had PEA cardiac arrest, 2 rounds of CPR when ROSC achieved, patient intubated. Transferred to ICU, CVL and arterial line placed.   CT Head is unremarkable except for right sinus disease. CT Chest/Abdomen/Pelvis showed evidence of moderate acute pancreatitis, mild ascites and small bilateral pleural effusions. Lipase of 315U/L on 01/26/20. No initial clear etiology for pancreatitis, mother denies patient using alcohol, CT a/p negative for dilated biliary ducts or gall stones. Triglycerides found to be 600.    Past Medical History   Past Medical History:  Diagnosis Date  . Hypertension   . Psychiatric problem    Seen at [facility] for unknown reason. Takes seroquel. History of hearing voices.  Not commandivng voices.   . Schizophrenia (HCC)   . Tremors of nervous system     Significant Hospital Events   11/29 - admitted 11/30 - PEA Arrest and transfer to ICU  Consults:  PCCM  Procedures:  11/30 ETT 11/30 A line, removed 01/28/20 11/30 Left IJ CVL 11/30 NG tube placed 12/7 Left IJ CVL removed 12/8 Foley Catheter removed  Significant Diagnostic Tests:  CXR 11/29 - neg.  CT Head 12/1 - right sinus disease, no acute intracranial pathology CT Chest/Abd/Pel 12/1 -moderate pancreatis, mild asicites. Small bilateral pleural effusions Abd x-ray 12/1 - NG tube tip and side port in proximal stomach. No bowel obstruction or free air CXR 12/1 - No pneumothorax, developing pneumonia left lower lobe. Mild bibasilar atelectasis. RUQ [place] 12/1 - GB sludge, negative acute cholecystitis. Fatty liver. Small ascites/partially visualized right pleural effusion  TTE Echo 12/1 -  Gr I diastolic dysfunction LE [place] 12/1 - No evidence DVT in either LE  CXR 01/29/20 - Lines and tubes in stable position. New infiltrate right lung base. Persistent bibasilar atelectasis.   LUE [place] 01/29/20 - No evidence of DVT  EEG 12/4>>severe diffuse encephalopathy  MRI Brain 12/5>>No acute findings   CT Abdomen 12/6 - acute pancreatitis with interval decrease in peripancreatic and upper abdominal edema since prior exam. No pancreatic hemorrhage, mass or pancreatitis-associated fluid collection.  KUB 12/09 Nonobstructive bowel gas pattern, enteric tube tip/side hole overlie the gastric body  CXR 12/10>> Improvement of right sided infiltrates/atelectasis. Left pleural effusion improving  CXR 12/14>> Mild bilateral infiltrates unchanged. No new findings. ET tube 8 cm above carina  Micro Data:  11/30 Bld Cx - no growth  11/30 MRSA - negative 12/07 Trache Aspirate Cx - Abundant WBC, predom PMN. No growth 12/12 Bld Cx x2 - No growth 3 days  Antimicrobials:  01/26/2020 cefepime  01/26/2020 - d/c'd 12/1 vancomycin 02/02/2020 - DC cefepime. Vancomycin/meropenem started for 7 day course (end date 12/13) 02/06/2020 Vancomycin discontinued due to red rash and linezolid started 02/08/2020 Linezolid, Meropenem discontinued, red rash now with vesicles  Interim history/subjective:  Patient continues to not follow commands when decreasing precedex drip, moving arms and legs sporadically. Discussion was had yesterday with patient's mother that he has been intubated for approximately 14 days and she would like to pursue placement of a tracheostomy if patient is unable to have adequate respirations once extubated.   Objective   Blood pressure 109/71, pulse 67, temperature 98 F (36.7 C),  temperature source Axillary, resp. rate 18, height 5\' 10"  (1.778 m), weight 102.2 kg, SpO2 99 %.    Vent Mode: PRVC FiO2 (%):  [40 %] 40 % Set Rate:  [18 bmp] 18 bmp Vt Set:  [510 mL] 510 mL PEEP:  [5 cmH20] 5  cmH20 Plateau Pressure:  [12 cmH20-16 cmH20] 15 cmH20   Intake/Output Summary (Last 24 hours) at 02/10/2020 0737 Last data filed at 02/10/2020 0600 Gross per 24 hour  Intake 3396.91 ml  Output 2175 ml  Net 1221.91 ml   Filed Weights   02/08/20 0500 02/09/20 0500 02/10/20 0500  Weight: 107.2 kg 105.6 kg 102.2 kg   Examination: General: Critically ill-appearing, on vent support HENT: Mucous membranes dry. PERRL.  Lungs: Bilateral mechanically ventilated breath sounds, no wheezing or rhonchi apprecaited Cardiovascular: tachycardic, S1-S2 Abdomen: Non-distended. BS+ Extremities: SCD's in place.  Skin: Erythema improved on left chest wall and bilateral extremities, vesicles improved on chest wall/extremities.  GU: Condom cath in place.   Assessment & Plan:  Acute Hypoxemic Respiratory Failure Encephalopathy In setting of cardiac arrest, shock and pancreatitis. Encephalopathy remains an issue to extubation. Suspect secondary to psychiatric history as well as possible withdrawal from persistent sedatives since being admitted. Patient is moving legs and arms this am, not following commands however.  Plan: -patient day 14 of intubation, mother would like to continue with full scope of care despite patient's persistent encephalopathy. -discussion had yesterday with mother in terms of patient's care and possibility of having trache placed. Will continue to have discussions with her daily -will decrease cap on precedex to 1, start clonidine .1 mg q6h with BP parameters.  -continue Seroquel 100mg  morning and 200 mg in the evening. Consider increasing doses -maintain full vent support with SAT/SBT as tolerated -titrate Vent setting to maintain SpO2 greater than or equal to 90%. -HOB elevated 30 degrees. -plateau pressures less than 30 cm H20.  -bronchial hygiene and RT/bronchodilator protocol.   Severe Pancreatitis  Unclear etiology, Tg elevated upon admission. IgG negative. Mother denies  alcohol and drug use. Imaging negative for biliary stricture or cholestasis. CT a/p 12/06 no pancreatic hemorrhage, mass, or fluid collection seen. Abdomen with active bowel sounds. KUB performed 12/09, revealed nonobstructive bowel gas pattern. Plan: -appears resolved at this time -Tg of 265>247 -miralax BID -continue supportive care -tube feeds continued  Shock PEA Cardiac Arrest In setting of pancreatitis, DKA and severe metabolic derangements Plan: -off pressors at this time, will continue to monitor.   Leukocytosis Fever Erythematous Rash Unknown etiology of leukocytosis. WBC improving. Rash improving. DC antibiotics 12/13. Will restart if patient has increasing signs of infection.  Plan: -continue to trend WBC, consider restarting antibiotics if WBC increases -all central lines and foley removed  Diabetic Ketoacidosis, likely secondary to pancreatic inflammation New onset in setting of pancreatitis. Off of insulin drip. Tube feeds in context of intubated state.  Plan: -titrate SSI for goal CBG 140-180.  Hx of Schizophrenia Patient followed by [facility]. Current outpatient regimen of abilify monthly. Mother states patient missed November dosage.  Plan: -seroquel 100 mg in the morning and 200 mg in the evening. -consider increasing as agitation may be contributing to patient's tachycardia when off of sedatives.   Bilateral Pulmonary Infiltrates Appear unchanged from prior imaging.  Plan: -discontinued antibiotics at this time due to suspected drug reaction -will continue to monitor for worsening of infection, trending wbc and daily pulmonary exams.    Acute Kidney Injury Hypernatremia Hyperphosphatemia AKI improving. Continues to have hypernatremia,  increased free water yesterday to 300 mL. Plan: -continue free water -monitor renal function and electrolytes  Buttock Wound Nursing note flaking of skin around buttocks Plan: -wound care consulted, reccs of gerhardt's  butt cream to MASD/IAD after cleansing area.    Best practice:  Diet: TF Pain/Anxiety/Delirium protocol (if indicated): precedex,  VAP protocol (if indicated): In place DVT prophylaxis: SCD's / Lovenox. GI prophylaxis: PPI Glucose control: Standard glycemic control Mobility: Bedrest. Last date of multidisciplinary goals of care discussion: Goals of care discussion with mother 12/10  Family and staff present: Nurse and doctor Summary of discussion: Patient is full code. Continue aggressive care measures. Follow up goals of care discussion due: Will discuss trache placement today with mother Code Status: Full  Labs   CBC: Recent Labs  Lab 02/06/20 1119 02/07/20 0141 02/07/20 0404 02/07/20 1817 02/08/20 1146 02/09/20 0048 02/10/20 0319  WBC 26.8* 26.0*  --  22.7* 20.2* 13.7* 11.1*  NEUTROABS 23.7*  --   --  20.0* 17.6* 11.2*  --   HGB 9.9* 10.4* 10.2* 9.7* 9.4* 8.9* 8.4*  HCT 31.3* 31.1* 30.0* 29.1* 28.8* 27.2* 27.4*  MCV 95.1 93.4  --  93.9 95.0 95.4 97.9  PLT 239 223  --  263 253 260 247    Basic Metabolic Panel: Recent Labs  Lab 02/05/20 0023 02/06/20 0130 02/06/20 1642 02/07/20 0141 02/07/20 0404 02/08/20 1146 02/09/20 0718 02/10/20 0319  NA 147* 146* 141 144 144 148* 149* 149*  K 4.4 4.1 5.5* 5.2* 4.6 4.3 4.1 3.5  CL 108 107 106 105  --  105 109 109  CO2 25 26 23 24   --  30 29 30   GLUCOSE 167* 132* 249* 161*  --  279* 143* 134*  BUN 96* 92* 82* 82*  --  68* 63* 58*  CREATININE 2.40* 2.17* 1.87* 2.03*  --  1.69* 1.64* 1.31*  CALCIUM 9.1 9.2 9.2 9.5  --  9.7 9.6 9.7  MG 2.2 2.3  --  2.3  --   --   --   --   PHOS 5.1* 6.3* 4.6 5.5*  --   --   --   --    GFR: Estimated Creatinine Clearance: 87.1 mL/min (A) (by C-G formula based on SCr of 1.31 mg/dL (H)). Recent Labs  Lab 02/07/20 1817 02/08/20 1146 02/09/20 0048 02/10/20 0319  WBC 22.7* 20.2* 13.7* 11.1*   Liver Function Tests: Recent Labs  Lab 02/06/20 0130 02/06/20 1642 02/07/20 0141  02/08/20 1146 02/09/20 0718 02/10/20 0319  AST 44*  --  47* 40 48* 41  ALT 54*  --  48* 44 48* 46*  ALKPHOS 150*  --  137* 116 116 106  BILITOT 0.5  --  0.7 0.7 0.6 1.0  PROT 5.9*  --  6.6 6.5 6.3* 6.0*  ALBUMIN 1.6* 1.8* 1.9* 2.1* 2.1* 1.9*   No results for input(s): LIPASE, AMYLASE in the last 168 hours. No results for input(s): AMMONIA in the last 168 hours. ABG    Component Value Date/Time   PHART 7.452 (H) 02/07/2020 0404   PCO2ART 46.4 02/07/2020 0404   PO2ART 250 (H) 02/07/2020 0404   HCO3 32.1 (H) 02/07/2020 0404   TCO2 33 (H) 02/07/2020 0404   ACIDBASEDEF 5.0 (H) 01/26/2020 2111   O2SAT 100.0 02/07/2020 0404     Coagulation Profile: No results for input(s): INR, PROTIME in the last 168 hours.  Cardiac Enzymes: No results for input(s): CKTOTAL, CKMB, CKMBINDEX, TROPONINI in the last 168 hours.  HbA1C: Hgb A1c MFr Bld  Date/Time Value Ref Range Status  01/25/2020 05:40 PM 13.4 (H) 4.8 - 5.6 % Final    Comment:    (NOTE) Pre diabetes:          5.7%-6.4%  Diabetes:              >6.4%  Glycemic control for   <7.0% adults with diabetes     CBG: Recent Labs  Lab 02/09/20 1505 02/09/20 1911 02/09/20 2307 02/10/20 0303 02/10/20 0713  GLUCAP 133* 158* 94 48* 143*    Vasili Alexandrea Westergard Internal Medicine Resident PGY-1 [facility]

## 2020-02-10 NOTE — Progress Notes (Signed)
Inpatient Diabetes Program Recommendations  AACE/ADA: New Consensus Statement on Inpatient Glycemic Control (2015)  Target Ranges:  Prepandial:   less than 140 mg/dL      Peak postprandial:   less than 180 mg/dL (1-2 hours)      Critically ill patients:  140 - 180 mg/dL   Lab Results  Component Value Date   GLUCAP 143 (H) 02/10/2020   HGBA1C 13.4 (H) 01/25/2020    Review of Glycemic Control Results for Bryan [name], Bryan [name] (MRN [id number]) as of 02/10/2020 11:22  Ref. Range 02/09/2020 07:24 02/09/2020 11:18 02/09/2020 15:05 02/09/2020 19:11 02/09/2020 23:07 02/10/2020 03:03 02/10/2020 07:13  Glucose-Capillary Latest Ref Range: 70 - 99 mg/dL 657 (H) 846 (H) 962 (H) 158 (H) 94 48 (L) 143 (H)   Inpatient Diabetes Program Recommendations:   - Consider decrease in Novolog correction to sensitive 0-9 Secure chat to Dr. Evie Lacks.  Thank you, Billy Fischer. Maicee Ullman, RN, MSN, CDE  Diabetes Coordinator Inpatient Glycemic Control Team Team Pager [phone] (8am-5pm) 02/10/2020 11:32 AM

## 2020-02-10 NOTE — Progress Notes (Signed)
K+ 3.5  Replaced per protocol  

## 2020-02-10 NOTE — Progress Notes (Signed)
Nutrition Follow-up  DOCUMENTATION CODES:   Not applicable  INTERVENTION:   Continue TF via OG tube: Vital 1.5 at 60 ml/h (1440 ml per day) Prosource TF 45 ml QID Provides 2320 kcal, 141 gm protein, 1100 ml free water daily.  Free water flushes 200 ml every 2 hours for a total of 3.5 L free water per day.  NUTRITION DIAGNOSIS:   Increased nutrient needs related to acute illness (pancreatitis) as evidenced by estimated needs.  Ongoing   GOAL:   Patient will meet greater than or equal to 90% of their needs  Met with TF  MONITOR:   Vent status,Labs,Weight trends,TF tolerance,I & O's  REASON FOR ASSESSMENT:   Consult Enteral/tube feeding initiation and management (trickle tube feeds)  ASSESSMENT:   44 yo male admitted with AMS, new onset DM with DKA. PMH includes MDD, paranoid schizophrenia, HTN, smoker, drug use (cocaine, marijuana), heavy alcohol use.  Discussed patient in ICU rounds and with RN today. Patient may require trach placement.  Currently receiving Vital 1.5 via OGT at 60 ml/h with Prosource TF 45 ml QID. Free water flushes 200 ml every 2 hours. Tolerating well.   Patient remains intubated on ventilator support MV: 12.6 L/min Temp (24hrs), Avg:98.4 F (36.9 C), Min:97.7 F (36.5 C), Max:99.4 F (37.4 C)   Labs reviewed. Sodium 149, BUN 58, Creat 1.31, triglycerides 247 CBG: 94-48-143  Medications reviewed and include colace, novolog, levemir, miralax, precedex.  Weight 102.2 kg today  I/O +12.5 L since admission UOP 2075 ml + 1 unmeasured occurrence x 24 h  Diet Order:   Diet Order            Diet NPO time specified  Diet effective now                 EDUCATION NEEDS:   Not appropriate for education at this time  Skin:  Skin Assessment: Reviewed RN Assessment (MASD and skin tear to buttocks)  Last BM:  12/15 rectal tube  Height:   Ht Readings from Last 1 Encounters:  01/25/20 '5\' 10"'  (1.778 m)    Weight:   Wt Readings  from Last 1 Encounters:  02/10/20 102.2 kg    Ideal Body Weight:  75.5 kg  BMI:  Body mass index is 32.33 kg/m.  Estimated Nutritional Needs:   Kcal:  2275  Protein:  135-160 gm  Fluid:  2.5 L    Lucas Mallow, RD, LDN, CNSC Please refer to Amion for contact information.

## 2020-02-11 ENCOUNTER — Inpatient Hospital Stay (HOSPITAL_COMMUNITY): Payer: Medicaid Other

## 2020-02-11 LAB — PHOSPHORUS: Phosphorus: 3.3 mg/dL (ref 2.5–4.6)

## 2020-02-11 LAB — POCT I-STAT 7, (LYTES, BLD GAS, ICA,H+H)
Acid-Base Excess: 10 mmol/L — ABNORMAL HIGH (ref 0.0–2.0)
Bicarbonate: 33.3 mmol/L — ABNORMAL HIGH (ref 20.0–28.0)
Calcium, Ion: 1.35 mmol/L (ref 1.15–1.40)
HCT: 28 % — ABNORMAL LOW (ref 39.0–52.0)
Hemoglobin: 9.5 g/dL — ABNORMAL LOW (ref 13.0–17.0)
O2 Saturation: 99 %
Patient temperature: 98.7
Potassium: 4.1 mmol/L (ref 3.5–5.1)
Sodium: 147 mmol/L — ABNORMAL HIGH (ref 135–145)
TCO2: 35 mmol/L — ABNORMAL HIGH (ref 22–32)
pCO2 arterial: 41.1 mmHg (ref 32.0–48.0)
pH, Arterial: 7.517 — ABNORMAL HIGH (ref 7.350–7.450)
pO2, Arterial: 112 mmHg — ABNORMAL HIGH (ref 83.0–108.0)

## 2020-02-11 LAB — CBC
HCT: 28.2 % — ABNORMAL LOW (ref 39.0–52.0)
Hemoglobin: 9.1 g/dL — ABNORMAL LOW (ref 13.0–17.0)
MCH: 31 pg (ref 26.0–34.0)
MCHC: 32.3 g/dL (ref 30.0–36.0)
MCV: 95.9 fL (ref 80.0–100.0)
Platelets: 273 10*3/uL (ref 150–400)
RBC: 2.94 MIL/uL — ABNORMAL LOW (ref 4.22–5.81)
RDW: 14.4 % (ref 11.5–15.5)
WBC: 13.9 10*3/uL — ABNORMAL HIGH (ref 4.0–10.5)
nRBC: 0 % (ref 0.0–0.2)

## 2020-02-11 LAB — COMPREHENSIVE METABOLIC PANEL
ALT: 55 U/L — ABNORMAL HIGH (ref 0–44)
AST: 55 U/L — ABNORMAL HIGH (ref 15–41)
Albumin: 2.1 g/dL — ABNORMAL LOW (ref 3.5–5.0)
Alkaline Phosphatase: 118 U/L (ref 38–126)
Anion gap: 10 (ref 5–15)
BUN: 43 mg/dL — ABNORMAL HIGH (ref 6–20)
CO2: 28 mmol/L (ref 22–32)
Calcium: 9.6 mg/dL (ref 8.9–10.3)
Chloride: 108 mmol/L (ref 98–111)
Creatinine, Ser: 1.27 mg/dL — ABNORMAL HIGH (ref 0.61–1.24)
GFR, Estimated: >60 mL/min (ref >60)
Glucose, Bld: 96 mg/dL (ref 70–99)
Potassium: 4.1 mmol/L (ref 3.5–5.1)
Sodium: 146 mmol/L — ABNORMAL HIGH (ref 135–145)
Total Bilirubin: 0.8 mg/dL (ref 0.3–1.2)
Total Protein: 6 g/dL — ABNORMAL LOW (ref 6.5–8.1)

## 2020-02-11 LAB — GLUCOSE, CAPILLARY
Glucose-Capillary: 85 mg/dL (ref 70–99)
Glucose-Capillary: 133 mg/dL — ABNORMAL HIGH (ref 70–99)
Glucose-Capillary: 102 mg/dL — ABNORMAL HIGH (ref 70–99)
Glucose-Capillary: 153 mg/dL — ABNORMAL HIGH (ref 70–99)
Glucose-Capillary: 146 mg/dL — ABNORMAL HIGH (ref 70–99)
Glucose-Capillary: 116 mg/dL — ABNORMAL HIGH (ref 70–99)

## 2020-02-11 LAB — MAGNESIUM: Magnesium: 1.9 mg/dL (ref 1.7–2.4)

## 2020-02-11 MED ORDER — QUETIAPINE FUMARATE 100 MG PO TABS
300.0000 mg | ORAL_TABLET | Freq: Every day | ORAL | Status: DC
  Administered 2020-02-11 – 2020-02-13 (×3): 300 mg
  Filled 2020-02-11 (×3): qty 3

## 2020-02-11 MED ORDER — FUROSEMIDE 10 MG/ML IJ SOLN
40.0000 mg | Freq: Once | INTRAMUSCULAR | Status: AC
  Administered 2020-02-11: 12:00:00 40 mg via INTRAVENOUS
  Filled 2020-02-11: qty 4

## 2020-02-11 MED ORDER — QUETIAPINE FUMARATE 100 MG PO TABS
300.0000 mg | ORAL_TABLET | Freq: Every morning | ORAL | Status: DC
  Administered 2020-02-11 – 2020-02-12 (×2): 300 mg
  Filled 2020-02-11 (×2): qty 3

## 2020-02-11 MED ORDER — QUETIAPINE FUMARATE 100 MG PO TABS
300.0000 mg | ORAL_TABLET | Freq: Every morning | ORAL | Status: DC
  Filled 2020-02-11: qty 3

## 2020-02-11 MED ORDER — QUETIAPINE FUMARATE 100 MG PO TABS: 300.0000 mg | ORAL_TABLET | Freq: Every day | ORAL | Status: DC

## 2020-02-11 MED ORDER — CHLORHEXIDINE GLUCONATE 0.12 % MT SOLN
OROMUCOSAL | Status: AC
  Filled 2020-02-11: qty 15

## 2020-02-11 MED ORDER — CLONIDINE HCL 0.2 MG PO TABS
0.4000 mg | ORAL_TABLET | Freq: Four times a day (QID) | ORAL | Status: DC
  Administered 2020-02-11 – 2020-02-15 (×14): 0.4 mg
  Filled 2020-02-11 (×15): qty 2

## 2020-02-11 NOTE — Progress Notes (Addendum)
NAME:  Bryan [name], MRN:  [id number], DOB:  Apr 05, 1975, LOS: 17 ADMISSION DATE:  01/25/2020, CONSULTATION DATE:  01/25/20 REFERRING MD:  Claudette Laws CHIEF COMPLAINT:  AMS    Brief History   Bryan [name] is a 44 y.o. male admitted w/ DKA on 11/29.  PCCM consulted 11/30 for agitation and borderline hypotension. Patient later had PEA cardiac arrest, 2 rounds of CPR when ROSC achieved, patient intubated. Transferred to ICU, CVL and arterial line placed.   CT Head is unremarkable except for right sinus disease. CT Chest/Abdomen/Pelvis showed evidence of moderate acute pancreatitis, mild ascites and small bilateral pleural effusions. Lipase of 315U/L on 01/26/20. No initial clear etiology for pancreatitis, mother denies patient using alcohol, CT a/p negative for dilated biliary ducts or gall stones. Triglycerides found to be 600.    Past Medical History   Past Medical History:  Diagnosis Date  . Hypertension   . Psychiatric problem    Seen at [facility] for unknown reason. Takes seroquel. History of hearing voices.  Not commandivng voices.   . Schizophrenia (HCC)   . Tremors of nervous system     Significant Hospital Events   11/29 - admitted 11/30 - PEA Arrest and transfer to ICU  Consults:  PCCM  Procedures:  11/30 ETT 11/30 A line, removed 01/28/20 11/30 Left IJ CVL 11/30 NG tube placed 12/7 Left IJ CVL removed 12/8 Foley Catheter removed  Significant Diagnostic Tests:  CXR 11/29 - neg.  CT Head 12/1 - right sinus disease, no acute intracranial pathology CT Chest/Abd/Pel 12/1 -moderate pancreatis, mild asicites. Small bilateral pleural effusions Abd x-ray 12/1 - NG tube tip and side port in proximal stomach. No bowel obstruction or free air CXR 12/1 - No pneumothorax, developing pneumonia left lower lobe. Mild bibasilar atelectasis. RUQ [place] 12/1 - GB sludge, negative acute cholecystitis. Fatty liver. Small ascites/partially visualized right pleural effusion  TTE Echo 12/1 -  Gr I diastolic dysfunction LE [place] 12/1 - No evidence DVT in either LE  CXR 01/29/20 - Lines and tubes in stable position. New infiltrate right lung base. Persistent bibasilar atelectasis.   LUE [place] 01/29/20 - No evidence of DVT  EEG 12/4>>severe diffuse encephalopathy  MRI Brain 12/5>>No acute findings   CT Abdomen 12/6 - acute pancreatitis with interval decrease in peripancreatic and upper abdominal edema since prior exam. No pancreatic hemorrhage, mass or pancreatitis-associated fluid collection.  KUB 12/09 Nonobstructive bowel gas pattern, enteric tube tip/side hole overlie the gastric body  CXR 12/10>> Improvement of right sided infiltrates/atelectasis. Left pleural effusion improving  CXR 12/14>> Mild bilateral infiltrates unchanged. No new findings. ET tube 8 cm above carina  Micro Data:  11/30 Bld Cx - no growth  11/30 MRSA - negative 12/07 Trache Aspirate Cx - Abundant WBC, predom PMN. No growth 12/12 Bld Cx x2 - No growth 4 days  Antimicrobials:  01/26/2020 cefepime  01/26/2020 - d/c'd 12/1 vancomycin 02/02/2020 - DC cefepime. Vancomycin/meropenem started for 7 day course (end date 12/13) 02/06/2020 Vancomycin discontinued due to red rash and linezolid started 02/08/2020 Linezolid, Meropenem discontinued, red rash now with vesicles  Interim history/subjective:  Patient following commands yesterday, squeezing hands and sticking out tongue. Decreased versed drip ceiling to 1.2 yesterday and supplemented with clonidine. No acute event overnights, required one dose of versed for agitation. Not follow commands this am, appears to be more lethargic.   Objective   Blood pressure (!) 160/91, pulse (!) 115, temperature 98.7 F (37.1 C), temperature source Oral, resp. rate (!) 23,  height 5\' 10"  (1.778 m), weight 107.3 kg, SpO2 97 %.    Vent Mode: PRVC FiO2 (%):  [40 %] 40 % Set Rate:  [18 bmp] 18 bmp Vt Set:  [510 mL] 510 mL PEEP:  [5 cmH20] 5 cmH20 Pressure Support:  [10 cmH20]  10 cmH20 Plateau Pressure:  [14 cmH20-16 cmH20] 14 cmH20   Intake/Output Summary (Last 24 hours) at 02/11/2020 0709 Last data filed at 02/11/2020 0630 Gross per 24 hour  Intake 2286.99 ml  Output 2925 ml  Net -638.01 ml   Filed Weights   02/09/20 0500 02/10/20 0500 02/11/20 0500  Weight: 105.6 kg 102.2 kg 107.3 kg   Examination: General: Critically ill-appearing, on vent support HENT: Mucous membranes dry. PERRL.  Lungs: Bilateral mechanically ventilated breath sounds, no wheezing or rhonchi apprecaited Cardiovascular: tachycardic, S1-S2 Abdomen: Non-distended. BS+ Extremities: SCD's in place.  Skin: Rash on chest and bilateral extremities improving GU: Condom cath in place.   Assessment & Plan:  Acute Hypoxemic Respiratory Failure Encephalopathy In setting of cardiac arrest, shock and pancreatitis. Encephalopathy remains an issue to extubation. Suspect secondary to psychiatric history as well as possible withdrawal from persistent sedatives since being admitted. Patient is moving legs and arms this am, not following commands however.  Plan: -patient day 17 of intubation, mother would like to continue with full scope of care despite patient's persistent encephalopathy. -plan to discuss future plans with mother including extubation and potential tracheostomy placement.  -precedex cap decreased to .8, increased clonidine to .4 mg q6h. Continue to decrease precedex throughout the day.  -prn fentanyl for breakthrough agitation. Will d/c prn versed -continue Seroquel 200mg  morning and 200 mg in the evening. Will check QTC today -maintain full vent support with SAT/SBT as tolerated -titrate Vent setting to maintain SpO2 greater than or equal to 90%. -HOB elevated 30 degrees. -plateau pressures less than 30 cm H20.  -bronchial hygiene and RT/bronchodilator protocol.   Severe Pancreatitis  Unclear etiology, Tg elevated upon admission. IgG negative. Mother denies alcohol and drug use.  Imaging negative for biliary stricture or cholestasis. CT a/p 12/06 no pancreatic hemorrhage, mass, or fluid collection seen. Abdomen with active bowel sounds. KUB performed 12/09, revealed nonobstructive bowel gas pattern. Plan: -appears resolved at this time -Tg of 265>247 -miralax BID -continue supportive care -tube feeds continued  Shock PEA Cardiac Arrest In setting of pancreatitis, DKA and severe metabolic derangements Plan: -off pressors at this time, will continue to monitor.   Leukocytosis Fever Erythematous Rash Unknown etiology of leukocytosis. WBC increased to 13.9. Rash improving. DC antibiotics 12/13. Will restart if patient has increasing signs of infection.  Plan: -continue to trend WBC, consider restarting antibiotics if WBC increases -all central lines and foley removed  Diabetic Ketoacidosis, likely secondary to pancreatic inflammation New onset in setting of pancreatitis. Off of insulin drip. Tube feeds in context of intubated state.  Plan: -titrate SSI for goal CBG 140-180. -hypoglycemia protocol in place  Hx of Schizophrenia Patient followed by [facility]. Current outpatient regimen of abilify monthly. Mother states patient missed November dosage.  Plan: -seroquel 200 mg in the morning and 200 mg in the evening. -consider increasing as agitation may be contributing to patient's tachycardia when off of sedatives.   Bilateral Pulmonary Infiltrates Appear unchanged from prior imaging.  Plan: -discontinued antibiotics at this time due to suspected drug reaction -will continue to monitor for worsening of infection, trending wbc and daily pulmonary exams.    Acute Kidney Injury Hypernatremia Hyperphosphatemia AKI improving. Increased free  water to 300cc's yesterday. Na 149>146 Plan: -continue free water -monitor renal function and electrolytes  Buttock Wound Nursing note flaking of skin around buttocks Plan: -wound care consulted, recs of gerhardt's  butt cream to MASD/IAD after cleansing area.    Best practice:  Diet: TF Pain/Anxiety/Delirium protocol (if indicated): precedex, clonidine, fentanyl, versed VAP protocol (if indicated): In place DVT prophylaxis: SCD's / Lovenox. GI prophylaxis: PPI Glucose control: Standard glycemic control Mobility: Bedrest. Last date of multidisciplinary goals of care discussion: Goals of care discussion with mother 12/14 Family and staff present: Nurse and doctor Summary of discussion: Patient is full code. Continue aggressive care measures. Follow up goals of care discussion due: Will discuss further plan with mother today in terms of timeline for extubation and possible tracheostomy placement Code Status: Full  Labs   CBC: Recent Labs  Lab 02/06/20 1119 02/07/20 0141 02/07/20 1817 02/08/20 1146 02/09/20 0048 02/10/20 0319 02/11/20 0103 02/11/20 0356  WBC 26.8*   < > 22.7* 20.2* 13.7* 11.1* 13.9*  --   NEUTROABS 23.7*  --  20.0* 17.6* 11.2*  --   --   --   HGB 9.9*   < > 9.7* 9.4* 8.9* 8.4* 9.1* 9.5*  HCT 31.3*   < > 29.1* 28.8* 27.2* 27.4* 28.2* 28.0*  MCV 95.1   < > 93.9 95.0 95.4 97.9 95.9  --   PLT 239   < > 263 253 260 247 273  --    < > = values in this interval not displayed.    Basic Metabolic Panel: Recent Labs  Lab 02/05/20 0023 02/06/20 0130 02/06/20 1642 02/07/20 0141 02/07/20 0404 02/08/20 1146 02/09/20 0718 02/10/20 0319 02/11/20 0103 02/11/20 0356  NA 147* 146* 141 144   < > 148* 149* 149* 146* 147*  K 4.4 4.1 5.5* 5.2*   < > 4.3 4.1 3.5 4.1 4.1  CL 108 107 106 105  --  105 109 109 108  --   CO2 25 26 23 24   --  30 29 30 28   --   GLUCOSE 167* 132* 249* 161*  --  279* 143* 134* 96  --   BUN 96* 92* 82* 82*  --  68* 63* 58* 43*  --   CREATININE 2.40* 2.17* 1.87* 2.03*  --  1.69* 1.64* 1.31* 1.27*  --   CALCIUM 9.1 9.2 9.2 9.5  --  9.7 9.6 9.7 9.6  --   MG 2.2 2.3  --  2.3  --   --   --   --  1.9  --   PHOS 5.1* 6.3* 4.6 5.5*  --   --   --   --  3.3  --     < > = values in this interval not displayed.   GFR: Estimated Creatinine Clearance: 92 mL/min (A) (by C-G formula based on SCr of 1.27 mg/dL (H)). Recent Labs  Lab 02/08/20 1146 02/09/20 0048 02/10/20 0319 02/11/20 0103  WBC 20.2* 13.7* 11.1* 13.9*   Liver Function Tests: Recent Labs  Lab 02/07/20 0141 02/08/20 1146 02/09/20 0718 02/10/20 0319 02/11/20 0103  AST 47* 40 48* 41 55*  ALT 48* 44 48* 46* 55*  ALKPHOS 137* 116 116 106 118  BILITOT 0.7 0.7 0.6 1.0 0.8  PROT 6.6 6.5 6.3* 6.0* 6.0*  ALBUMIN 1.9* 2.1* 2.1* 1.9* 2.1*   No results for input(s): LIPASE, AMYLASE in the last 168 hours. No results for input(s): AMMONIA in the last 168 hours. ABG  Component Value Date/Time   PHART 7.517 (H) 02/11/2020 0356   PCO2ART 41.1 02/11/2020 0356   PO2ART 112 (H) 02/11/2020 0356   HCO3 33.3 (H) 02/11/2020 0356   TCO2 35 (H) 02/11/2020 0356   ACIDBASEDEF 5.0 (H) 01/26/2020 2111   O2SAT 99.0 02/11/2020 0356     Coagulation Profile: No results for input(s): INR, PROTIME in the last 168 hours.  Cardiac Enzymes: No results for input(s): CKTOTAL, CKMB, CKMBINDEX, TROPONINI in the last 168 hours.  HbA1C: Hgb A1c MFr Bld  Date/Time Value Ref Range Status  01/25/2020 05:40 PM 13.4 (H) 4.8 - 5.6 % Final    Comment:    (NOTE) Pre diabetes:          5.7%-6.4%  Diabetes:              >6.4%  Glycemic control for   <7.0% adults with diabetes     CBG: Recent Labs  Lab 02/10/20 1522 02/10/20 1910 02/10/20 2210 02/10/20 2312 02/11/20 0310  GLUCAP 128* 94 85 95 85    Vasili Abbygale Lapid Internal Medicine Resident PGY-1 [place]

## 2020-02-11 NOTE — Progress Notes (Signed)
EEG complete - results pending 

## 2020-02-11 NOTE — TOC Progression Note (Signed)
Transition of Care [facility]) - Progression Note    Patient Details  Name: Bryan [name] MRN: [id number] Date of Birth: 1975/09/21  Transition of Care [facility] CM/SW Contact  Lockie Pares, RN Phone Number: 02/11/2020, 10:00 AM  Clinical Narrative:    Day 17 of intubation discussions will surround tracheostomy with ongoing encephalopathy. Difficult weaning due to this as well as potentially related to medication regimen with known psych issues. Followed by [facility] Recommend palliative consult for GOC discussion.   Expected Discharge Plan: Skilled Nursing Facility Barriers to Discharge: Continued Medical Work up  Expected Discharge Plan and Services Expected Discharge Plan: Skilled Nursing Facility In-house Referral: Clinical Social Work Discharge Planning Services: CM Consult   Living arrangements for the past 2 months: Apartment                                       Social Determinants of Health (SDOH) Interventions    Readmission Risk Interventions No flowsheet data found.

## 2020-02-12 DIAGNOSIS — F259 Schizoaffective disorder, unspecified: Secondary | ICD-10-CM

## 2020-02-12 LAB — COMPREHENSIVE METABOLIC PANEL
ALT: 58 U/L — ABNORMAL HIGH (ref 0–44)
AST: 46 U/L — ABNORMAL HIGH (ref 15–41)
Albumin: 2.2 g/dL — ABNORMAL LOW (ref 3.5–5.0)
Alkaline Phosphatase: 124 U/L (ref 38–126)
Anion gap: 11 (ref 5–15)
BUN: 42 mg/dL — ABNORMAL HIGH (ref 6–20)
CO2: 29 mmol/L (ref 22–32)
Calcium: 9.4 mg/dL (ref 8.9–10.3)
Chloride: 104 mmol/L (ref 98–111)
Creatinine, Ser: 1.41 mg/dL — ABNORMAL HIGH (ref 0.61–1.24)
GFR, Estimated: >60 mL/min (ref >60)
Glucose, Bld: 143 mg/dL — ABNORMAL HIGH (ref 70–99)
Potassium: 4.1 mmol/L (ref 3.5–5.1)
Sodium: 144 mmol/L (ref 135–145)
Total Bilirubin: 0.9 mg/dL (ref 0.3–1.2)
Total Protein: 6.5 g/dL (ref 6.5–8.1)

## 2020-02-12 LAB — CBC
HCT: 27.6 % — ABNORMAL LOW (ref 39.0–52.0)
Hemoglobin: 8.7 g/dL — ABNORMAL LOW (ref 13.0–17.0)
MCH: 30.5 pg (ref 26.0–34.0)
MCHC: 31.5 g/dL (ref 30.0–36.0)
MCV: 96.8 fL (ref 80.0–100.0)
Platelets: 253 10*3/uL (ref 150–400)
RBC: 2.85 MIL/uL — ABNORMAL LOW (ref 4.22–5.81)
RDW: 14.8 % (ref 11.5–15.5)
WBC: 13.9 10*3/uL — ABNORMAL HIGH (ref 4.0–10.5)
nRBC: 0 % (ref 0.0–0.2)

## 2020-02-12 LAB — CULTURE, BLOOD (ROUTINE X 2)
Culture: NO GROWTH
Culture: NO GROWTH
Special Requests: ADEQUATE
Special Requests: ADEQUATE

## 2020-02-12 LAB — POCT I-STAT 7, (LYTES, BLD GAS, ICA,H+H)
Acid-Base Excess: 8 mmol/L — ABNORMAL HIGH (ref 0.0–2.0)
Bicarbonate: 32.7 mmol/L — ABNORMAL HIGH (ref 20.0–28.0)
Calcium, Ion: 1.3 mmol/L (ref 1.15–1.40)
HCT: 25 % — ABNORMAL LOW (ref 39.0–52.0)
Hemoglobin: 8.5 g/dL — ABNORMAL LOW (ref 13.0–17.0)
O2 Saturation: 88 %
Patient temperature: 98.2
Potassium: 4.2 mmol/L (ref 3.5–5.1)
Sodium: 142 mmol/L (ref 135–145)
TCO2: 34 mmol/L — ABNORMAL HIGH (ref 22–32)
pCO2 arterial: 45.6 mmHg (ref 32.0–48.0)
pH, Arterial: 7.463 — ABNORMAL HIGH (ref 7.350–7.450)
pO2, Arterial: 52 mmHg — ABNORMAL LOW (ref 83.0–108.0)

## 2020-02-12 LAB — MAGNESIUM: Magnesium: 1.9 mg/dL (ref 1.7–2.4)

## 2020-02-12 LAB — GLUCOSE, CAPILLARY
Glucose-Capillary: 119 mg/dL — ABNORMAL HIGH (ref 70–99)
Glucose-Capillary: 123 mg/dL — ABNORMAL HIGH (ref 70–99)
Glucose-Capillary: 125 mg/dL — ABNORMAL HIGH (ref 70–99)
Glucose-Capillary: 126 mg/dL — ABNORMAL HIGH (ref 70–99)
Glucose-Capillary: 150 mg/dL — ABNORMAL HIGH (ref 70–99)
Glucose-Capillary: 72 mg/dL (ref 70–99)

## 2020-02-12 LAB — PHOSPHORUS: Phosphorus: 4.2 mg/dL (ref 2.5–4.6)

## 2020-02-12 MED ORDER — WHITE PETROLATUM EX OINT
TOPICAL_OINTMENT | CUTANEOUS | Status: AC
  Filled 2020-02-12: qty 28.35

## 2020-02-12 NOTE — Procedures (Signed)
Cortrak  Person Inserting Tube:  Neilson Oehlert, RD Tube Type:  Cortrak - 43 inches Tube Location:  Left nare Initial Placement:  Stomach Secured by: Bridle Technique Used to Measure Tube Placement:  Documented cm marking at nare/ corner of mouth Cortrak Secured At:  64 cm   No x-ray is required. RN may begin using tube.   If the tube becomes dislodged please keep the tube and contact the Cortrak team at [email or web address] (password [email or web address]) for replacement.  If after hours and replacement cannot be delayed, place a NG tube and confirm placement with an abdominal x-ray.    Nakaya Mishkin RD, LDN Clinical Nutrition Pager listed in AMION    

## 2020-02-12 NOTE — Procedures (Signed)
Patient Name: Bryan [name]  MRN: [id number]  Epilepsy Attending: Charlsie Quest  Referring Physician/Provider: Dr Belva Agee Date: 02/11/2020 Duration: 23.40 mins  Patient history: 44yo M with ams. EEG to evaluate for seizure  Level of alertness: lethargic  AEDs during EEG study: None  Technical aspects: This EEG study was done with scalp electrodes positioned according to the 10-20 International system of electrode placement. Electrical activity was acquired at a sampling rate of 500Hz  and reviewed with a high frequency filter of 70Hz  and a low frequency filter of 1Hz . EEG data were recorded continuously and digitally stored.   Description: No posterior dominant rhythm was seen. EEG showed continuous generalized 5 to 7 Hz theta slowing. Hyperventilation and photic stimulation were not performed.     ABNORMALITY -Continuous slow, generalized  IMPRESSION: This study is suggestive of moderate diffuse encephalopathy, nonspecific etiology. No seizures or epileptiform discharges were seen throughout the recording.  Richie Bonanno 

## 2020-02-12 NOTE — Progress Notes (Signed)
NAME:  Bryan [name], MRN:  [id number], DOB:  Nov 12, 1975, LOS: 18 ADMISSION DATE:  01/25/2020, CONSULTATION DATE:  01/25/20 REFERRING MD:  Claudette Laws CHIEF COMPLAINT:  AMS    Brief History   Bryan [name] is a 44 y.o. male admitted w/ DKA on 11/29.  PCCM consulted 11/30 for agitation and borderline hypotension. Patient later had PEA cardiac arrest, 2 rounds of CPR when ROSC achieved, patient intubated. Transferred to ICU, CVL and arterial line placed.   CT Head is unremarkable except for right sinus disease. CT Chest/Abdomen/Pelvis showed evidence of moderate acute pancreatitis, mild ascites and small bilateral pleural effusions. Lipase of 315U/L on 01/26/20. No initial clear etiology for pancreatitis, mother denies patient using alcohol, CT a/p negative for dilated biliary ducts or gall stones. Triglycerides found to be 600.    Past Medical History   Past Medical History:  Diagnosis Date  . Hypertension   . Psychiatric problem    Seen at [facility] for unknown reason. Takes seroquel. History of hearing voices.  Not commandivng voices.   . Schizophrenia (HCC)   . Tremors of nervous system     Significant Hospital Events   11/29 - admitted 11/30 - PEA Arrest and transfer to ICU  Consults:  PCCM  Procedures:  11/30 ETT 11/30 A line, removed 01/28/20 11/30 Left IJ CVL 11/30 NG tube placed 12/7 Left IJ CVL removed 12/8 Foley Catheter removed  Significant Diagnostic Tests:  CXR 11/29 - neg.  CT Head 12/1 - right sinus disease, no acute intracranial pathology CT Chest/Abd/Pel 12/1 -moderate pancreatis, mild asicites. Small bilateral pleural effusions Abd x-ray 12/1 - NG tube tip and side port in proximal stomach. No bowel obstruction or free air CXR 12/1 - No pneumothorax, developing pneumonia left lower lobe. Mild bibasilar atelectasis. RUQ [place] 12/1 - GB sludge, negative acute cholecystitis. Fatty liver. Small ascites/partially visualized right pleural effusion  TTE Echo 12/1 -  Gr I diastolic dysfunction LE [place] 12/1 - No evidence DVT in either LE  CXR 01/29/20 - Lines and tubes in stable position. New infiltrate right lung base. Persistent bibasilar atelectasis.   LUE [place] 01/29/20 - No evidence of DVT  EEG 12/4>>severe diffuse encephalopathy  MRI Brain 12/5>>No acute findings   CT Abdomen 12/6 - acute pancreatitis with interval decrease in peripancreatic and upper abdominal edema since prior exam. No pancreatic hemorrhage, mass or pancreatitis-associated fluid collection.  KUB 12/09 Nonobstructive bowel gas pattern, enteric tube tip/side hole overlie the gastric body  CXR 12/10>> Improvement of right sided infiltrates/atelectasis. Left pleural effusion improving  CXR 12/14>> Mild bilateral infiltrates unchanged. No new findings. ET tube 8 cm above carina  EEG 12/16>> Moderate diffuse encephalopathy, nonspecific etiology. No seziures or epileptiform discharges  Micro Data:  11/30 Bld Cx - no growth  11/30 MRSA - negative 12/07 Trache Aspirate Cx - Abundant WBC, predom PMN. No organism on gram stain. Culture few candida albicans, no Staph aureus.  12/12 Bld Cx x2 - No growth 6 days  Antimicrobials:  01/26/2020 cefepime  01/26/2020 - d/c'd 12/1 vancomycin 02/02/2020 - DC cefepime. Vancomycin/meropenem started for 7 day course (end date 12/13) 02/06/2020 Vancomycin discontinued due to red rash and linezolid started 02/08/2020 Linezolid, Meropenem discontinued, red rash now with vesicles  Interim history/subjective:  Patient continues to be lethargic, moving extremities but not following commands. Still on precedex with cap of .8. Yesterday afternoon, patient had sloughing of skin and vesicles on face, upper chest, and back. Improved today. RN notes one occurrence of fever overnight,  101.8.  Objective   Blood pressure 129/84, pulse (!) 117, temperature 100 F (37.8 C), temperature source Oral, resp. rate (!) 21, height 5\' 10"  (1.778 m), weight 105.2 kg, SpO2 97  %.    Vent Mode: PRVC FiO2 (%):  [40 %] 40 % Set Rate:  [18 bmp] 18 bmp Vt Set:  [510 mL] 510 mL PEEP:  [5 cmH20] 5 cmH20 Pressure Support:  [15 cmH20] 15 cmH20 Plateau Pressure:  [14 cmH20-18 cmH20] 18 cmH20   Intake/Output Summary (Last 24 hours) at 02/12/2020 0646 Last data filed at 02/12/2020 0600 Gross per 24 hour  Intake 1991.82 ml  Output 3635 ml  Net -1643.18 ml   Filed Weights   02/10/20 0500 02/11/20 0500 02/12/20 0500  Weight: 102.2 kg 107.3 kg 105.2 kg   Examination: General: Critically ill-appearing, on vent support HENT: Mucous membranes dry. PERRL. Vesicles no longer present on patient's face  Lungs: Bilateral mechanically ventilated breath sounds, no wheezing or rhonchi apprecaited Cardiovascular: tachycardic, S1-S2 Abdomen: Non-distended. BS+ Extremities: SCD's in place.  Skin: Vesicular rash on chest neck, back improved from yesterday, now more consistent with sloughing of skin. Neuro: Sedated, not following commands. Moving extremities. Cough/gag reflex present.  GU: Condom cath in place.   Assessment & Plan:  Acute Hypoxemic Respiratory Failure Encephalopathy In setting of cardiac arrest, shock and pancreatitis. Patient continues to be desynchronous on the vent with decreasing sedatives, patient weaned yesterday for few hours. Encephalopathy seems to be largest contributing factor to respiratory failure at this point. EEG 12/16 positive for acute encephalopathy. Believe at this point patient should be scheduled for tracheostomy placement in setting of encephalopathy and inability to extubate patient.  Plan: -patient day 18 of intubation, will discuss with mother plan to place tracheostomy next week -discontinue all continuous sedatives at this time, PRN precedex for any dyssynchrony on the vent -Seroquel increased 300 BID, QTC yesterday 411 -maintain full vent support with SAT/SBT as tolerated -titrate Vent setting to maintain SpO2 greater than or equal to  90%. -HOB elevated 30 degrees. -plateau pressures less than 30 cm H20.  -bronchial hygiene and RT/bronchodilator protocol.   Severe Pancreatitis  Unclear etiology, Tg elevated upon admission. IgG negative. Mother denies alcohol and drug use. Imaging negative for biliary stricture or cholestasis. CT a/p 12/06 no pancreatic hemorrhage, mass, or fluid collection seen. Abdomen with active bowel sounds. KUB performed 12/09, revealed nonobstructive bowel gas pattern. Plan: -appears resolved at this time -Tg improving -miralax BID -continue supportive care -tube feeds continued  Shock PEA Cardiac Arrest In setting of pancreatitis, DKA and severe metabolic derangements Plan: -off pressors at this time, will continue to monitor.   Leukocytosis Fever Erythematous Rash Unknown etiology of leukocytosis. WBC 13.9. Vesicular rash, suspect secondary to drug reaction. DC antibiotics 12/13. Will restart if patient has increasing signs of infection.  Plan: -continue to trend WBC, consider restarting antibiotics if WBC increases -all central lines and foley removed -continue to monitor for worsening drug rash and review suspected offending agents.   Diabetic Ketoacidosis, likely secondary to pancreatic inflammation New onset in setting of pancreatitis. Off of insulin drip. Tube feeds in context of intubated state.  Plan: -titrate SSI for goal CBG 140-180. -hypoglycemia protocol in place  Hx of Schizophrenia Patient followed by [facility]. Current outpatient regimen of abilify monthly. Mother states patient missed November dosage.  Plan: -seroquel 300 mg in the morning and 300 mg in the evening.  Bilateral Pulmonary Infiltrates Appear unchanged from prior imaging.  Plan: -discontinued antibiotics  at this time due to suspected drug reaction -will continue to monitor for worsening of infection, trending wbc and daily pulmonary exams.    Acute Kidney Injury Hypernatremia Hyperphosphatemia AKI  improving. Increased free water to 200cc's q2h Na 144 Plan: -continue free water -monitor renal function and electrolytes  Buttock Wound Nursing note flaking of skin around buttocks Plan: -wound care consulted, recs of gerhardt's butt cream to MASD/IAD after cleansing area.    Best practice:  Diet: TF Pain/Anxiety/Delirium protocol (if indicated): As needed for synchrony on vent only; precedex VAP protocol (if indicated): In place DVT prophylaxis: SCD's / Lovenox. GI prophylaxis: PPI Glucose control: Standard glycemic control Mobility: Bedrest. Last date of multidisciplinary goals of care discussion: Goals of care discussion with mother 12/16, discuss tracheostomy placement, mother would like to wait until Monday Family and staff present: Nurse and doctor Summary of discussion: Patient is full code. Continue aggressive care measures. Follow up goals of care discussion due: 12/18 Code Status: Full  Labs   CBC: Recent Labs  Lab 02/06/20 1119 02/07/20 0141 02/07/20 1817 02/08/20 1146 02/09/20 0048 02/10/20 0319 02/11/20 0103 02/11/20 0356 02/12/20 0235 02/12/20 0248  WBC 26.8*   < > 22.7* 20.2* 13.7* 11.1* 13.9*  --   --  13.9*  NEUTROABS 23.7*  --  20.0* 17.6* 11.2*  --   --   --   --   --   HGB 9.9*   < > 9.7* 9.4* 8.9* 8.4* 9.1* 9.5* 8.5* 8.7*  HCT 31.3*   < > 29.1* 28.8* 27.2* 27.4* 28.2* 28.0* 25.0* 27.6*  MCV 95.1   < > 93.9 95.0 95.4 97.9 95.9  --   --  96.8  PLT 239   < > 263 253 260 247 273  --   --  253   < > = values in this interval not displayed.    Basic Metabolic Panel: Recent Labs  Lab 02/06/20 0130 02/06/20 1642 02/07/20 0141 02/07/20 0404 02/08/20 1146 02/09/20 0718 02/10/20 0319 02/11/20 0103 02/11/20 0356 02/12/20 0235 02/12/20 0248  NA 146* 141 144   < > 148* 149* 149* 146* 147* 142 144  K 4.1 5.5* 5.2*   < > 4.3 4.1 3.5 4.1 4.1 4.2 4.1  CL 107 106 105  --  105 109 109 108  --   --  104  CO2 26 23 24   --  30 29 30 28   --   --  29   GLUCOSE 132* 249* 161*  --  279* 143* 134* 96  --   --  143*  BUN 92* 82* 82*  --  68* 63* 58* 43*  --   --  42*  CREATININE 2.17* 1.87* 2.03*  --  1.69* 1.64* 1.31* 1.27*  --   --  1.41*  CALCIUM 9.2 9.2 9.5  --  9.7 9.6 9.7 9.6  --   --  9.4  MG 2.3  --  2.3  --   --   --   --  1.9  --   --  1.9  PHOS 6.3* 4.6 5.5*  --   --   --   --  3.3  --   --  4.2   < > = values in this interval not displayed.   GFR: Estimated Creatinine Clearance: 82.1 mL/min (A) (by C-G formula based on SCr of 1.41 mg/dL (H)). Recent Labs  Lab 02/09/20 0048 02/10/20 0319 02/11/20 0103 02/12/20 0248  WBC 13.7* 11.1* 13.9* 13.9*  Liver Function Tests: Recent Labs  Lab 02/08/20 1146 02/09/20 0718 02/10/20 0319 02/11/20 0103 02/12/20 0248  AST 40 48* 41 55* 46*  ALT 44 48* 46* 55* 58*  ALKPHOS 116 116 106 118 124  BILITOT 0.7 0.6 1.0 0.8 0.9  PROT 6.5 6.3* 6.0* 6.0* 6.5  ALBUMIN 2.1* 2.1* 1.9* 2.1* 2.2*   No results for input(s): LIPASE, AMYLASE in the last 168 hours. No results for input(s): AMMONIA in the last 168 hours. ABG    Component Value Date/Time   PHART 7.463 (H) 02/12/2020 0235   PCO2ART 45.6 02/12/2020 0235   PO2ART 52 (L) 02/12/2020 0235   HCO3 32.7 (H) 02/12/2020 0235   TCO2 34 (H) 02/12/2020 0235   ACIDBASEDEF 5.0 (H) 01/26/2020 2111   O2SAT 88.0 02/12/2020 0235     Coagulation Profile: No results for input(s): INR, PROTIME in the last 168 hours.  Cardiac Enzymes: No results for input(s): CKTOTAL, CKMB, CKMBINDEX, TROPONINI in the last 168 hours.  HbA1C: Hgb A1c MFr Bld  Date/Time Value Ref Range Status  01/25/2020 05:40 PM 13.4 (H) 4.8 - 5.6 % Final    Comment:    (NOTE) Pre diabetes:          5.7%-6.4%  Diabetes:              >6.4%  Glycemic control for   <7.0% adults with diabetes     CBG: Recent Labs  Lab 02/11/20 1127 02/11/20 1554 02/11/20 1903 02/11/20 2305 02/12/20 0304  GLUCAP 102* 153* 146* 116* 123*    Vasili Levette Paulick Internal  Medicine Resident PGY-1 [facility]

## 2020-02-12 NOTE — Consult Note (Signed)
Reason for Consult: Respiratory failure, ventilator dependence  HPI:  Bryan [name] is an 44 y.o. male with a history of cardiac arrest, PEA, and DKA. He developed respiratory failure, requiring intubation and ventilator support. He has been on vent for 17 days, and continues to be ventilator dependent.  Past Medical History:  Diagnosis Date  . Hypertension   . Psychiatric problem    Seen at [facility] for unknown reason. Takes seroquel. History of hearing voices.  Not commandivng voices.   . Schizophrenia ([place])   . Tremors of nervous system     Past Surgical History:  Procedure Laterality Date  . none      History reviewed. No pertinent family history.  Social History:  reports that he has been smoking cigarettes. He has been smoking about 0.50 packs per day. He has never used smokeless tobacco. He reports current alcohol use. He reports current drug use. Drugs: Cocaine and Marijuana.  Allergies:  Allergies  Allergen Reactions  . Fluphenazine Shortness Of Breath and Swelling    Tongue swelling, and shaking  . Coffee Bean Extract [Coffea Arabica] Swelling  . Vancomycin Rash    Bullous rash over trunk, arms, and face     Prior to Admission medications   Medication Sig Start Date End Date Taking? Authorizing Provider  ARIPiprazole ER (ABILIFY MAINTENA) 400 MG SRER injection Inject 2 mLs (400 mg total) into the muscle every 28 (twenty-eight) days. (Due on 04-12-2018): For mood control 04/12/18  Yes Nwoko, Herbert Pun I, NP  benztropine (COGENTIN) 0.5 MG tablet Take 1 tablet (0.5 mg total) by mouth 2 (two) times daily. For prevention of drug induced tremors 03/18/18  Yes Nwoko, Herbert Pun I, NP  hydrOXYzine (ATARAX/VISTARIL) 25 MG tablet Take 1 tablet (25 mg total) by mouth every 6 (six) hours as needed for anxiety. 03/18/18  Yes Lindell Spar I, NP  temazepam (RESTORIL) 30 MG capsule Take 1 capsule (30 mg total) by mouth at bedtime. For sleep 03/18/18  Yes Lindell Spar I, NP  traZODone (DESYREL)  50 MG tablet Take 1 tablet (50 mg total) by mouth at bedtime as needed for sleep. 03/18/18  Yes Lindell Spar I, NP  HYDROcodone-acetaminophen (NORCO/VICODIN) 5-325 MG tablet Take 1 tablet by mouth every 8 (eight) hours as needed. Patient not taking: Reported on 01/25/2020 10/03/19   Rosemarie Ax, MD  ibuprofen (ADVIL) 600 MG tablet Take 1 tablet (600 mg total) by mouth every 8 (eight) hours as needed. Patient not taking: Reported on 01/25/2020 10/03/19   Rosemarie Ax, MD  risperiDONE (RISPERDAL) 3 MG tablet Take 1 tablet (3 mg total) by mouth 2 (two) times daily. For mood control Patient not taking: Reported on 01/25/2020 03/18/18   Lindell Spar I, NP    Medications:  I have reviewed the patient's current medications. Scheduled: . chlorhexidine gluconate (MEDLINE KIT)  15 mL Mouth Rinse BID  . Chlorhexidine Gluconate Cloth  6 each Topical Daily  . cloNIDine  0.4 mg Per Tube Q6H  . docusate  100 mg Per Tube BID  . enoxaparin (LOVENOX) injection  40 mg Subcutaneous Q24H  . feeding supplement (PROSource TF)  45 mL Per Tube QID  . free water  200 mL Per Tube Q2H  . Gerhardt's butt cream   Topical BID  . insulin aspart  0-15 Units Subcutaneous Q4H  . insulin aspart  8 Units Subcutaneous Q4H  . insulin detemir  40 Units Subcutaneous BID  . mouth rinse  15 mL Mouth Rinse 10 times per  day  . pantoprazole sodium  40 mg Per Tube Daily  . polyethylene glycol  17 g Per Tube BID  . QUEtiapine  300 mg Per Tube q morning - 10a   And  . QUEtiapine  300 mg Per Tube QHS   Continuous: . sodium chloride 250 mL (02/08/20 1825)  . dexmedetomidine (PRECEDEX) IV infusion Stopped (02/12/20 0175)  . feeding supplement (VITAL 1.5 CAL) 1,000 mL (02/12/20 1445)    Results for orders placed or performed during the hospital encounter of 01/25/20 (from the past 48 hour(s))  Glucose, capillary     Status: None   Collection Time: 02/10/20  7:10 PM  Result Value Ref Range   Glucose-Capillary 94 70 - 99  mg/dL    Comment: Glucose reference range applies only to samples taken after fasting for at least 8 hours.  Glucose, capillary     Status: None   Collection Time: 02/10/20 10:10 PM  Result Value Ref Range   Glucose-Capillary 85 70 - 99 mg/dL    Comment: Glucose reference range applies only to samples taken after fasting for at least 8 hours.  Glucose, capillary     Status: None   Collection Time: 02/10/20 11:12 PM  Result Value Ref Range   Glucose-Capillary 95 70 - 99 mg/dL    Comment: Glucose reference range applies only to samples taken after fasting for at least 8 hours.  Comprehensive metabolic panel     Status: Abnormal   Collection Time: 02/11/20  1:03 AM  Result Value Ref Range   Sodium 146 (H) 135 - 145 mmol/L   Potassium 4.1 3.5 - 5.1 mmol/L   Chloride 108 98 - 111 mmol/L   CO2 28 22 - 32 mmol/L   Glucose, Bld 96 70 - 99 mg/dL    Comment: Glucose reference range applies only to samples taken after fasting for at least 8 hours.   BUN 43 (H) 6 - 20 mg/dL   Creatinine, Ser 1.27 (H) 0.61 - 1.24 mg/dL   Calcium 9.6 8.9 - 10.3 mg/dL   Total Protein 6.0 (L) 6.5 - 8.1 g/dL   Albumin 2.1 (L) 3.5 - 5.0 g/dL   AST 55 (H) 15 - 41 U/L   ALT 55 (H) 0 - 44 U/L   Alkaline Phosphatase 118 38 - 126 U/L   Total Bilirubin 0.8 0.3 - 1.2 mg/dL   GFR, Estimated >60 >60 mL/min    Comment: (NOTE) Calculated using the CKD-EPI Creatinine Equation (2021)    Anion gap 10 5 - 15    Comment: Performed at [facility], [place] [address] [place], [place] [address]  CBC     Status: Abnormal   Collection Time: 02/11/20  1:03 AM  Result Value Ref Range   WBC 13.9 (H) 4.0 - 10.5 K/uL   RBC 2.94 (L) 4.22 - 5.81 MIL/uL   Hemoglobin 9.1 (L) 13.0 - 17.0 g/dL   HCT 28.2 (L) 39.0 - 52.0 %   MCV 95.9 80.0 - 100.0 fL   MCH 31.0 26.0 - 34.0 pg   MCHC 32.3 30.0 - 36.0 g/dL   RDW 14.4 11.5 - 15.5 %   Platelets 273 150 - 400 K/uL   nRBC 0.0 0.0 - 0.2 %    Comment: Performed at [facility],  [place] [address], [place], [place] [address]  Phosphorus     Status: None   Collection Time: 02/11/20  1:03 AM  Result Value Ref Range   Phosphorus 3.3  2.5 - 4.6 mg/dL    Comment: Performed at [facility], [place] [address], [place], [place] [address]  Magnesium     Status: None   Collection Time: 02/11/20  1:03 AM  Result Value Ref Range   Magnesium 1.9 1.7 - 2.4 mg/dL    Comment: Performed at [place] [address] [place], [place] [address]  Glucose, capillary     Status: None   Collection Time: 02/11/20  3:10 AM  Result Value Ref Range   Glucose-Capillary 85 70 - 99 mg/dL    Comment: Glucose reference range applies only to samples taken after fasting for at least 8 hours.  I-STAT 7, (LYTES, BLD GAS, ICA, H+H)     Status: Abnormal   Collection Time: 02/11/20  3:56 AM  Result Value Ref Range   pH, Arterial 7.517 (H) 7.350 - 7.450   pCO2 arterial 41.1 32.0 - 48.0 mmHg   pO2, Arterial 112 (H) 83.0 - 108.0 mmHg   Bicarbonate 33.3 (H) 20.0 - 28.0 mmol/L   TCO2 35 (H) 22 - 32 mmol/L   O2 Saturation 99.0 %   Acid-Base Excess 10.0 (H) 0.0 - 2.0 mmol/L   Sodium 147 (H) 135 - 145 mmol/L   Potassium 4.1 3.5 - 5.1 mmol/L   Calcium, Ion 1.35 1.15 - 1.40 mmol/L   HCT 28.0 (L) 39.0 - 52.0 %   Hemoglobin 9.5 (L) 13.0 - 17.0 g/dL   Patient temperature 98.7 F    Collection site Radial    Drawn by RT    Sample type ARTERIAL   Glucose, capillary     Status: Abnormal   Collection Time: 02/11/20  7:20 AM  Result Value Ref Range   Glucose-Capillary 133 (H) 70 - 99 mg/dL    Comment: Glucose reference range applies only to samples taken after fasting for at least 8 hours.  Glucose, capillary     Status: Abnormal   Collection Time: 02/11/20 11:27 AM  Result Value Ref Range   Glucose-Capillary 102 (H) 70 - 99 mg/dL    Comment: Glucose reference range applies only to samples taken after fasting for at least 8 hours.  Glucose, capillary     Status: Abnormal   Collection Time: 02/11/20   3:54 PM  Result Value Ref Range   Glucose-Capillary 153 (H) 70 - 99 mg/dL    Comment: Glucose reference range applies only to samples taken after fasting for at least 8 hours.  Glucose, capillary     Status: Abnormal   Collection Time: 02/11/20  7:03 PM  Result Value Ref Range   Glucose-Capillary 146 (H) 70 - 99 mg/dL    Comment: Glucose reference range applies only to samples taken after fasting for at least 8 hours.  Glucose, capillary     Status: Abnormal   Collection Time: 02/11/20 11:05 PM  Result Value Ref Range   Glucose-Capillary 116 (H) 70 - 99 mg/dL    Comment: Glucose reference range applies only to samples taken after fasting for at least 8 hours.  I-STAT 7, (LYTES, BLD GAS, ICA, H+H)     Status: Abnormal   Collection Time: 02/12/20  2:35 AM  Result Value Ref Range   pH, Arterial 7.463 (H) 7.350 - 7.450   pCO2 arterial 45.6 32.0 - 48.0 mmHg   pO2, Arterial 52 (L) 83.0 - 108.0 mmHg   Bicarbonate 32.7 (H) 20.0 - 28.0 mmol/L   TCO2 34 (H) 22 - 32 mmol/L   O2 Saturation  88.0 %   Acid-Base Excess 8.0 (H) 0.0 - 2.0 mmol/L   Sodium 142 135 - 145 mmol/L   Potassium 4.2 3.5 - 5.1 mmol/L   Calcium, Ion 1.30 1.15 - 1.40 mmol/L   HCT 25.0 (L) 39.0 - 52.0 %   Hemoglobin 8.5 (L) 13.0 - 17.0 g/dL   Patient temperature 98.2 F    Collection site Radial    Drawn by RT    Sample type ARTERIAL   CBC     Status: Abnormal   Collection Time: 02/12/20  2:48 AM  Result Value Ref Range   WBC 13.9 (H) 4.0 - 10.5 K/uL   RBC 2.85 (L) 4.22 - 5.81 MIL/uL   Hemoglobin 8.7 (L) 13.0 - 17.0 g/dL   HCT 27.6 (L) 39.0 - 52.0 %   MCV 96.8 80.0 - 100.0 fL   MCH 30.5 26.0 - 34.0 pg   MCHC 31.5 30.0 - 36.0 g/dL   RDW 14.8 11.5 - 15.5 %   Platelets 253 150 - 400 K/uL   nRBC 0.0 0.0 - 0.2 %    Comment: Performed at [facility], [place] [address], [place], [place] [address]  Comprehensive metabolic panel     Status: Abnormal   Collection Time: 02/12/20  2:48 AM  Result Value Ref Range   Sodium  144 135 - 145 mmol/L   Potassium 4.1 3.5 - 5.1 mmol/L   Chloride 104 98 - 111 mmol/L   CO2 29 22 - 32 mmol/L   Glucose, Bld 143 (H) 70 - 99 mg/dL    Comment: Glucose reference range applies only to samples taken after fasting for at least 8 hours.   BUN 42 (H) 6 - 20 mg/dL   Creatinine, Ser 1.41 (H) 0.61 - 1.24 mg/dL   Calcium 9.4 8.9 - 10.3 mg/dL   Total Protein 6.5 6.5 - 8.1 g/dL   Albumin 2.2 (L) 3.5 - 5.0 g/dL   AST 46 (H) 15 - 41 U/L   ALT 58 (H) 0 - 44 U/L   Alkaline Phosphatase 124 38 - 126 U/L   Total Bilirubin 0.9 0.3 - 1.2 mg/dL   GFR, Estimated >60 >60 mL/min    Comment: (NOTE) Calculated using the CKD-EPI Creatinine Equation (2021)    Anion gap 11 5 - 15    Comment: Performed at [facility], [place] [address] [place], [place] [address]  Magnesium     Status: None   Collection Time: 02/12/20  2:48 AM  Result Value Ref Range   Magnesium 1.9 1.7 - 2.4 mg/dL    Comment: Performed at [facility] [address] [place], [place] [address]  Phosphorus     Status: None   Collection Time: 02/12/20  2:48 AM  Result Value Ref Range   Phosphorus 4.2 2.5 - 4.6 mg/dL    Comment: Performed at [facility] [address], [place], [place] [address]  Glucose, capillary     Status: Abnormal   Collection Time: 02/12/20  3:04 AM  Result Value Ref Range   Glucose-Capillary 123 (H) 70 - 99 mg/dL    Comment: Glucose reference range applies only to samples taken after fasting for at least 8 hours.  Glucose, capillary     Status: Abnormal   Collection Time: 02/12/20  7:03 AM  Result Value Ref Range   Glucose-Capillary 126 (H) 70 - 99 mg/dL    Comment: Glucose reference range applies only to samples taken after fasting  for at least 8 hours.  Glucose, capillary     Status: Abnormal   Collection Time: 02/12/20 11:05 AM  Result Value Ref Range   Glucose-Capillary 119 (H) 70 - 99 mg/dL    Comment: Glucose reference range applies only to samples taken after fasting for  at least 8 hours.  Glucose, capillary     Status: Abnormal   Collection Time: 02/12/20  4:38 PM  Result Value Ref Range   Glucose-Capillary 125 (H) 70 - 99 mg/dL    Comment: Glucose reference range applies only to samples taken after fasting for at least 8 hours.    EEG  Result Date: 02/12/2020 Lora Havens, MD     02/12/2020  9:05 AM Patient Name: Zhion Pevehouse MRN: [id number] Epilepsy Attending: Lora Havens Referring Physician/Provider: Dr Riesa Pope Date: 02/11/2020 Duration: 23.40 mins Patient history: 44yo M with ams. EEG to evaluate for seizure Level of alertness: lethargic AEDs during EEG study: None Technical aspects: This EEG study was done with scalp electrodes positioned according to the 10-20 International system of electrode placement. Electrical activity was acquired at a sampling rate of '500Hz'  and reviewed with a high frequency filter of '70Hz'  and a low frequency filter of '1Hz' . EEG data were recorded continuously and digitally stored. Description: No posterior dominant rhythm was seen. EEG showed continuous generalized 5 to 7 Hz theta slowing. Hyperventilation and photic stimulation were not performed.   ABNORMALITY -Continuous slow, generalized IMPRESSION: This study is suggestive of moderate diffuse encephalopathy, nonspecific etiology. No seizures or epileptiform discharges were seen throughout the recording. Lora Havens   Review of System Unable to obtain. Pt is intubated and on vent  Blood pressure 113/79, pulse (!) 119, temperature (!) 101 F (38.3 C), temperature source Axillary, resp. rate (!) 23, height '5\' 10"'  (1.778 m), weight 105.2 kg, SpO2 96 %. General appearance: Intubated and unresponsive Head: Normocephalic, without obvious abnormality, atraumatic Ears: Examination of the ears shows normal auricles and external auditory canals bilaterally. Nose: Nasal examination shows normal mucosa, septum, turbinates.  Face: Facial examination shows no  asymmetry.   Mouth: An ET tube is in place. Neck: Obese neck. The trachea is midline.  Neuro: Intubated and unresponsive.  Assessment/Plan: Pt with respiratory failure and ventilator dependence. Recent history of cardiac arrest, PEA, and DKA. - Plan tracheostomy placement in the OR tomorrow. - Hold tube feed after midnight. - Inform consent obtained from family.  Jahira Swiss W Raun Routh 02/12/2020, 5:14 PM

## 2020-02-12 NOTE — Progress Notes (Signed)
Helping unit RT- RN called d/t pt agitated and small ? Cuff leak noted.  No cuff leak noted, pt was changed to cpap/psv and noted decrease in agitation s/p vent change.  RT aware.

## 2020-02-13 ENCOUNTER — Encounter (HOSPITAL_COMMUNITY)
Admission: EM | Disposition: A | Discharge status: Rehabilitation Facility | Payer: Self-pay | Source: Home / Self Care | Attending: Internal Medicine

## 2020-02-13 ENCOUNTER — Inpatient Hospital Stay (HOSPITAL_COMMUNITY): Payer: Medicaid Other | Admitting: Certified Registered"

## 2020-02-13 DIAGNOSIS — F209 Schizophrenia, unspecified: Secondary | ICD-10-CM

## 2020-02-13 HISTORY — PX: TRACHEOSTOMY TUBE PLACEMENT: SHX814

## 2020-02-13 LAB — COMPREHENSIVE METABOLIC PANEL
ALT: 52 U/L — ABNORMAL HIGH (ref 0–44)
AST: 42 U/L — ABNORMAL HIGH (ref 15–41)
Albumin: 1.9 g/dL — ABNORMAL LOW (ref 3.5–5.0)
Alkaline Phosphatase: 96 U/L (ref 38–126)
Anion gap: 10 (ref 5–15)
BUN: 36 mg/dL — ABNORMAL HIGH (ref 6–20)
CO2: 29 mmol/L (ref 22–32)
Calcium: 9.3 mg/dL (ref 8.9–10.3)
Chloride: 103 mmol/L (ref 98–111)
Creatinine, Ser: 1.16 mg/dL (ref 0.61–1.24)
GFR, Estimated: >60 mL/min (ref >60)
Glucose, Bld: 69 mg/dL — ABNORMAL LOW (ref 70–99)
Potassium: 3.8 mmol/L (ref 3.5–5.1)
Sodium: 142 mmol/L (ref 135–145)
Total Bilirubin: 0.6 mg/dL (ref 0.3–1.2)
Total Protein: 5.8 g/dL — ABNORMAL LOW (ref 6.5–8.1)

## 2020-02-13 LAB — GLUCOSE, CAPILLARY
Glucose-Capillary: 85 mg/dL (ref 70–99)
Glucose-Capillary: 47 mg/dL — ABNORMAL LOW (ref 70–99)
Glucose-Capillary: 107 mg/dL — ABNORMAL HIGH (ref 70–99)
Glucose-Capillary: 58 mg/dL — ABNORMAL LOW (ref 70–99)
Glucose-Capillary: 96 mg/dL (ref 70–99)
Glucose-Capillary: 169 mg/dL — ABNORMAL HIGH (ref 70–99)
Glucose-Capillary: 157 mg/dL — ABNORMAL HIGH (ref 70–99)

## 2020-02-13 LAB — TRIGLYCERIDES: Triglycerides: 138 mg/dL (ref <150)

## 2020-02-13 LAB — CBC
HCT: 25.9 % — ABNORMAL LOW (ref 39.0–52.0)
Hemoglobin: 8 g/dL — ABNORMAL LOW (ref 13.0–17.0)
MCH: 30.1 pg (ref 26.0–34.0)
MCHC: 30.9 g/dL (ref 30.0–36.0)
MCV: 97.4 fL (ref 80.0–100.0)
Platelets: 229 10*3/uL (ref 150–400)
RBC: 2.66 MIL/uL — ABNORMAL LOW (ref 4.22–5.81)
RDW: 14.4 % (ref 11.5–15.5)
WBC: 11.3 10*3/uL — ABNORMAL HIGH (ref 4.0–10.5)
nRBC: 0 % (ref 0.0–0.2)

## 2020-02-13 SURGERY — CREATION, TRACHEOSTOMY
Anesthesia: General | Site: Neck

## 2020-02-13 MED ORDER — MIDAZOLAM HCL 5 MG/5ML IJ SOLN
INTRAMUSCULAR | Status: DC | PRN
  Administered 2020-02-13: 2 mg via INTRAVENOUS

## 2020-02-13 MED ORDER — HEMOSTATIC AGENTS (NO CHARGE) OPTIME
TOPICAL | Status: DC | PRN
  Administered 2020-02-13: 1 via TOPICAL

## 2020-02-13 MED ORDER — LACTATED RINGERS IV SOLN: INTRAVENOUS | Status: DC | PRN

## 2020-02-13 MED ORDER — PHENYLEPHRINE HCL-NACL 10-0.9 MG/250ML-% IV SOLN
INTRAVENOUS | Status: AC
  Filled 2020-02-13: qty 250

## 2020-02-13 MED ORDER — VECURONIUM BROMIDE 10 MG IV SOLR
INTRAVENOUS | Status: DC | PRN
  Administered 2020-02-13: 3 mg via INTRAVENOUS

## 2020-02-13 MED ORDER — FENTANYL CITRATE (PF) 250 MCG/5ML IJ SOLN
INTRAMUSCULAR | Status: AC
  Filled 2020-02-13: qty 5

## 2020-02-13 MED ORDER — ACETAMINOPHEN 10 MG/ML IV SOLN
INTRAVENOUS | Status: AC
  Filled 2020-02-13: qty 100

## 2020-02-13 MED ORDER — LIDOCAINE-EPINEPHRINE 1 %-1:100000 IJ SOLN
INTRAMUSCULAR | Status: AC
  Filled 2020-02-13: qty 1

## 2020-02-13 MED ORDER — 0.9 % SODIUM CHLORIDE (POUR BTL) OPTIME
TOPICAL | Status: DC | PRN
  Administered 2020-02-13: 15:00:00 1000 mL

## 2020-02-13 MED ORDER — MIDAZOLAM HCL 2 MG/2ML IJ SOLN
INTRAMUSCULAR | Status: AC
  Filled 2020-02-13: qty 2

## 2020-02-13 MED ORDER — PHENYLEPHRINE 40 MCG/ML (10ML) SYRINGE FOR IV PUSH (FOR BLOOD PRESSURE SUPPORT)
PREFILLED_SYRINGE | INTRAVENOUS | Status: DC | PRN
  Administered 2020-02-13: 80 ug via INTRAVENOUS

## 2020-02-13 MED ORDER — ROCURONIUM BROMIDE 10 MG/ML (PF) SYRINGE
PREFILLED_SYRINGE | INTRAVENOUS | Status: AC
  Filled 2020-02-13: qty 30

## 2020-02-13 MED ORDER — LIDOCAINE-EPINEPHRINE 1 %-1:100000 IJ SOLN
INTRAMUSCULAR | Status: DC | PRN
  Administered 2020-02-13: 4 mL

## 2020-02-13 MED ORDER — ROCURONIUM BROMIDE 10 MG/ML (PF) SYRINGE
PREFILLED_SYRINGE | INTRAVENOUS | Status: DC | PRN
  Administered 2020-02-13: 50 mg via INTRAVENOUS

## 2020-02-13 MED ORDER — PROPOFOL 10 MG/ML IV BOLUS
INTRAVENOUS | Status: DC | PRN
  Administered 2020-02-13: 30 mg via INTRAVENOUS
  Administered 2020-02-13: 50 mg via INTRAVENOUS

## 2020-02-13 SURGICAL SUPPLY — 42 items
BLADE CLIPPER SURG (BLADE) IMPLANT
BLADE SURG 15 STRL LF DISP TIS (BLADE) IMPLANT
BLADE SURG 15 STRL SS (BLADE)
CANISTER SUCT 3000ML PPV (MISCELLANEOUS) ×2 IMPLANT
CLEANER TIP ELECTROSURG 2X2 (MISCELLANEOUS) ×2 IMPLANT
COVER SURGICAL LIGHT HANDLE (MISCELLANEOUS) ×2 IMPLANT
COVER WAND RF STERILE (DRAPES) ×2 IMPLANT
DECANTER SPIKE VIAL GLASS SM (MISCELLANEOUS) ×2 IMPLANT
DRAPE HALF SHEET 40X57 (DRAPES) IMPLANT
ELECT COATED BLADE 2.86 ST (ELECTRODE) ×2 IMPLANT
ELECT REM PT RETURN 9FT ADLT (ELECTROSURGICAL) ×2
ELECTRODE REM PT RTRN 9FT ADLT (ELECTROSURGICAL) ×1 IMPLANT
GAUZE 4X4 16PLY RFD (DISPOSABLE) IMPLANT
GAUZE XEROFORM 5X9 LF (GAUZE/BANDAGES/DRESSINGS) IMPLANT
GLOVE ECLIPSE 7.5 STRL STRAW (GLOVE) ×2 IMPLANT
GOWN STRL REUS W/ TWL LRG LVL3 (GOWN DISPOSABLE) ×3 IMPLANT
GOWN STRL REUS W/TWL LRG LVL3 (GOWN DISPOSABLE) ×6
HEMOSTAT SNOW SURGICEL 2X4 (HEMOSTASIS) ×1 IMPLANT
HOLDER TRACH TUBE VELCRO 19.5 (MISCELLANEOUS) ×1 IMPLANT
KIT BASIN OR (CUSTOM PROCEDURE TRAY) ×2 IMPLANT
KIT SUCTION CATH 14FR (SUCTIONS) IMPLANT
KIT TURNOVER KIT B (KITS) ×2 IMPLANT
NDL HYPO 25GX1X1/2 BEV (NEEDLE) ×1 IMPLANT
NEEDLE HYPO 25GX1X1/2 BEV (NEEDLE) ×2 IMPLANT
NS IRRIG 1000ML POUR BTL (IV SOLUTION) ×2 IMPLANT
PAD ARMBOARD 7.5X6 YLW CONV (MISCELLANEOUS) ×4 IMPLANT
PENCIL SMOKE EVACUATOR (MISCELLANEOUS) IMPLANT
SPONGE INTESTINAL PEANUT (DISPOSABLE) ×2 IMPLANT
SUT CHROMIC 3 0 SH 27 (SUTURE) IMPLANT
SUT PROLENE 2 0 SH DA (SUTURE) ×2 IMPLANT
SUT SILK 3 0 (SUTURE)
SUT SILK 3-0 18XBRD TIE 12 (SUTURE) ×1 IMPLANT
SUT VIC AB 3-0 SH 27 (SUTURE) ×2
SUT VIC AB 3-0 SH 27X BRD (SUTURE) IMPLANT
SYR 20ML LL LF (SYRINGE) IMPLANT
SYR BULB EAR ULCER 3OZ GRN STR (SYRINGE) IMPLANT
SYR CONTROL 10ML LL (SYRINGE) IMPLANT
TOWEL GREEN STERILE FF (TOWEL DISPOSABLE) ×2 IMPLANT
TRAY ENT MC OR (CUSTOM PROCEDURE TRAY) ×2 IMPLANT
TUBE CONNECTING 12X1/4 (SUCTIONS) ×1 IMPLANT
TUBE TRACH SHILEY 8 DIST CUF (TUBING) ×1 IMPLANT
WATER STERILE IRR 1000ML POUR (IV SOLUTION) ×2 IMPLANT

## 2020-02-13 NOTE — Anesthesia Preprocedure Evaluation (Addendum)
Anesthesia Evaluation  Patient identified by MRN, date of birth, ID band Patient unresponsive    Reviewed: Allergy & Precautions, NPO status , Patient's Chart, lab work & pertinent test results  Airway Mallampati: Intubated       Dental   Pulmonary shortness of breath, Current Smoker,     + decreased breath sounds  + intubated    Cardiovascular hypertension,  Rhythm:Regular Rate:Normal  Echo 01/27/2020 1. Left ventricular ejection fraction, by estimation, is 70 to 75%. The left ventricle has hyperdynamic function. Left ventricular endocardial border not optimally defined to evaluate regional wall motion, despite echo-contrast attempt. Left ventricular diastolic parameters are consistent with Grade I diastolic dysfunction (impaired relaxation).  2. Right ventricular systolic function is normal. The right ventricular size is normal.  3. The mitral valve is normal in structure. No evidence of mitral valve regurgitation.  4. The aortic valve was not well visualized. Aortic valve regurgitation is not visualized.  5. There is borderline dilatation of the aortic root, measuring 37 mm.  6. The inferior vena cava is normal in size with <50% respiratory variability, suggesting right atrial pressure of 8 mmHg.    Neuro/Psych PSYCHIATRIC DISORDERS Depression Schizophrenia negative neurological ROS     GI/Hepatic negative GI ROS, Neg liver ROS,   Endo/Other  diabetes  Renal/GU Renal disease     Musculoskeletal negative musculoskeletal ROS (+)   Abdominal (+) + obese,   Peds  Hematology negative hematology ROS (+)   Anesthesia Other Findings   Reproductive/Obstetrics                           Anesthesia Physical Anesthesia Plan  ASA: III  Anesthesia Plan: General   Post-op Pain Management:    Induction: Intravenous  PONV Risk Score and Plan: 1 and Ondansetron and Treatment may vary due to age or  medical condition  Airway Management Planned: Oral ETT  Additional Equipment: None  Intra-op Plan:   Post-operative Plan: Post-operative intubation/ventilation  Informed Consent: I have reviewed the patients History and Physical, chart, labs and discussed the procedure including the risks, benefits and alternatives for the proposed anesthesia with the patient or authorized representative who has indicated his/her understanding and acceptance.     Dental advisory given  Plan Discussed with: CRNA  Anesthesia Plan Comments:         Anesthesia Quick Evaluation

## 2020-02-13 NOTE — Op Note (Signed)
DATE OF PROCEDURE:  02/13/2020                              OPERATIVE REPORT  SURGEON:  Newman Pies, MD  PREOPERATIVE DIAGNOSES: 1. Respiratory failure. 2. Ventilator dependence  POSTOPERATIVE DIAGNOSES: 1. Respiratory failure. 2. Ventilator dependence  PROCEDURE PERFORMED:  Tracheostomy  ANESTHESIA:  General endotracheal tube anesthesia.  COMPLICATIONS:  None.  ESTIMATED BLOOD LOSS:  Minimal.  INDICATION FOR PROCEDURE:  Bryan [name] is a 44 y.o. male with a history of cardiac arrest, PEA, and DKA. He developed respiratory failure, requiring intubation and ventilator support. He has been on vent for 18 days, and continues to be ventilator dependent. The decision was therefore made to proceed with tracheostomy tube placement.  The risks, benefits, alternatives, and details of the procedure were discussed with the family.  Questions were invited and answered.  Informed consent was obtained.  DESCRIPTION:  The patient was taken to the operating room and placed supine on the operating table. General anesthesia was administered via the pre-existing endotracheal tube. The patient was positioned and prepped and draped in the standard fashion for tracheostomy tube placement. 1% lidocaine with 1-100,000 epinephrine was injected at the anterior neck. A 2 cm vertical incision was made in the anterior necks, at the level slightly below the cricoid bone. The incision was carried down past the level of the platysma muscle. The strep muscles were identified and divided in midline. They were retracted laterally, exposing the thyroid gland. The thyroid was divided at midline and retracted laterally. The anterior tracheal wall was exposed. A tracheal window was made at the level of the second tracheal ring. The endotracheal tube was withdrawn. A # 8 cuffed Shiley tracheostomy tube was placed without difficulty. Good end tidal volume and CO2 return was noted. The tracheostomy tube was secured in place with 2-0  Prolene sutures and circumferential necktie. The care of the patient was transferred to the anesthesiologist. The patient was awakened from anesthesia without difficulty. He was transferred back to the intensive care unit in stable condition.  OPERATIVE FINDINGS:  A # 8 cuffed Shiley tracheostomy tube was placed without difficulty.  SPECIMEN:  None.  FOLLOWUP CARE:  The patient will return to the ICU.   [name] W Rox Mcgriff 02/13/2020 2:59 PM

## 2020-02-13 NOTE — Anesthesia Postprocedure Evaluation (Signed)
Anesthesia Post Note  Patient: Bryan [name]  Procedure(s) Performed: TRACHEOSTOMY (N/A Neck)     Patient location during evaluation: SICU Anesthesia Type: General Level of consciousness: sedated and patient cooperative Pain management: pain level controlled Vital Signs Assessment: post-procedure vital signs reviewed and stable Respiratory status: spontaneous breathing Cardiovascular status: stable Anesthetic complications: no   No complications documented.  Last Vitals:  Vitals:   02/13/20 1926 02/13/20 2000  BP: 103/74 120/76  Pulse: 87 90  Resp: 17 20  Temp:    SpO2: 100% 98%    Last Pain:  Vitals:   02/13/20 1200  TempSrc: Axillary  PainSc:                  Lewie Loron

## 2020-02-13 NOTE — Progress Notes (Signed)
NAME:  Bryan [name], MRN:  [id number], DOB:  1976/01/15, LOS: 19 ADMISSION DATE:  01/25/2020, CONSULTATION DATE:  01/25/20 REFERRING MD:  Claudette Laws CHIEF COMPLAINT:  AMS    Brief History   Bryan [name] is a 44 y.o. male admitted w/ DKA on 11/29.  PCCM consulted 11/30 for agitation and borderline hypotension. Patient later had PEA cardiac arrest, 2 rounds of CPR when ROSC achieved, patient intubated. Transferred to ICU, CVL and arterial line placed.   CT Head is unremarkable except for right sinus disease. CT Chest/Abdomen/Pelvis showed evidence of moderate acute pancreatitis, mild ascites and small bilateral pleural effusions. Lipase of 315U/L on 01/26/20. No initial clear etiology for pancreatitis, mother denies patient using alcohol, CT a/p negative for dilated biliary ducts or gall stones. Triglycerides found to be 600.    Past Medical History   Past Medical History:  Diagnosis Date  . Hypertension   . Psychiatric problem    Seen at [facility] for unknown reason. Takes seroquel. History of hearing voices.  Not commandivng voices.   . Schizophrenia (HCC)   . Tremors of nervous system     Significant Hospital Events   11/29 - admitted 11/30 - PEA Arrest and transfer to ICU  Consults:  PCCM  Procedures:  11/30 ETT 11/30 A line, removed 01/28/20 11/30 Left IJ CVL 11/30 NG tube placed 12/7 Left IJ CVL removed 12/8 Foley Catheter removed  Significant Diagnostic Tests:  CXR 11/29 - neg.  CT Head 12/1 - right sinus disease, no acute intracranial pathology CT Chest/Abd/Pel 12/1 -moderate pancreatis, mild asicites. Small bilateral pleural effusions Abd x-ray 12/1 - NG tube tip and side port in proximal stomach. No bowel obstruction or free air CXR 12/1 - No pneumothorax, developing pneumonia left lower lobe. Mild bibasilar atelectasis. RUQ [place] 12/1 - GB sludge, negative acute cholecystitis. Fatty liver. Small ascites/partially visualized right pleural effusion  TTE Echo 12/1 -  Gr I diastolic dysfunction LE [place] 12/1 - No evidence DVT in either LE  CXR 01/29/20 - Lines and tubes in stable position. New infiltrate right lung base. Persistent bibasilar atelectasis.   LUE [place] 01/29/20 - No evidence of DVT  EEG 12/4>>severe diffuse encephalopathy  MRI Brain 12/5>>No acute findings   CT Abdomen 12/6 - acute pancreatitis with interval decrease in peripancreatic and upper abdominal edema since prior exam. No pancreatic hemorrhage, mass or pancreatitis-associated fluid collection.  KUB 12/09 Nonobstructive bowel gas pattern, enteric tube tip/side hole overlie the gastric body  CXR 12/10>> Improvement of right sided infiltrates/atelectasis. Left pleural effusion improving  CXR 12/14>> Mild bilateral infiltrates unchanged. No new findings. ET tube 8 cm above carina  EEG 12/16>> Moderate diffuse encephalopathy, nonspecific etiology. No seziures or epileptiform discharges  Micro Data:  11/30 Bld Cx - no growth  11/30 MRSA - negative 12/07 Trache Aspirate Cx - Abundant WBC, predom PMN. No organism on gram stain. Culture few candida albicans, no Staph aureus.  12/12 Bld Cx x2 - No growth 6 days  Antimicrobials:  01/26/2020 cefepime  01/26/2020 - d/c'd 12/1 vancomycin 02/02/2020 - DC cefepime. Vancomycin/meropenem started for 7 day course (end date 12/13) 02/06/2020 Vancomycin discontinued due to red rash and linezolid started 02/08/2020 Linezolid, Meropenem discontinued, red rash now with vesicles  Interim history/subjective:  Overnight required precedex to be restarted. Resting comfortably this morning. Was also hypoglycemic this morning.   Objective   Blood pressure 105/76, pulse 81, temperature 99.3 F (37.4 C), temperature source Axillary, resp. rate 18, height 5\' 10"  (1.778 m), weight  106.8 kg, SpO2 96 %.    Vent Mode: PRVC FiO2 (%):  [40 %] 40 % Set Rate:  [18 bmp] 18 bmp Vt Set:  [510 mL] 510 mL PEEP:  [5 cmH20] 5 cmH20 Pressure Support:  [10 cmH20] 10  cmH20 Plateau Pressure:  [13 cmH20-17 cmH20] 14 cmH20   Intake/Output Summary (Last 24 hours) at 02/13/2020 0745 Last data filed at 02/13/2020 0600 Gross per 24 hour  Intake 1583.25 ml  Output 1605 ml  Net -21.75 ml   Filed Weights   02/11/20 0500 02/12/20 0500 02/13/20 0406  Weight: 107.3 kg 105.2 kg 106.8 kg   Examination: General: Critically ill-appearing, on vent support HENT: ETT to vent Lungs: Bilateral mechanically ventilated breath sounds, no wheezing or rhonchi apprecaited Cardiovascular: RRR no mrg Abdomen: Non-distended. BS+ Extremities: SCD's in place.  Skin: raised purpura on chest, imrpoved from previous exam Neuro: Sedated, not following commands. Moving extremities. Cough/gag reflex present.  GU: Condom cath in place.   Assessment & Plan:  Acute Hypoxemic Respiratory Failure Barrier to extubation is encephalopathy Plan: Tracheostomy today with ENT -Seroquel 300 BID, QTC 12/16 was 411 -maintain full vent support with SAT/SBT as tolerated -titrate Vent setting to maintain SpO2 greater than or equal to 90%. -HOB elevated 30 degrees. -plateau pressures less than 30 cm H20.  -bronchial hygiene and RT/bronchodilator protocol.   Acute Metabolic Encephalopathy Schizophrenia Patient followed by [facility]. Current outpatient regimen of abilify monthly. Mother states patient missed November dosage. Now also with ICU delirium. Plan: - continue seroquel 300 mg BID for now.   Acute Severe Pancreatitis  Unclear etiology, Tg elevated upon admission. IgG negative. Mother denies alcohol and drug use. Imaging negative for biliary stricture or cholestasis. CT a/p 12/06 no pancreatic hemorrhage, mass, or fluid collection seen. Abdomen with active bowel sounds. KUB performed 12/09, revealed nonobstructive bowel gas pattern. Plan: -appears resolved at this time -TG improving -miralax BID -continue supportive care -tube feeds continued  Drug Rash - improving - unclear  etiology, possibly abx? - will observe off medications  DKA with new diagnosis DM2 New onset in setting of pancreatitis. Off of insulin drip. Tube feeds in context of intubated state.  Plan: -titrate SSI for goal CBG 140-180. -hypoglycemia protocol in place   PEA Cardiac Arrest In setting of pancreatitis, DKA and severe metabolic derangements Shock resolved.  Plan: Monitor electrolytes daily Monitor telemetry    Acute Kidney Injury - improving Hypernatremia - improving Hyperphosphatemia Plan: -continue free water flushes -monitor renal function and electrolytes  Buttock Wound Nursing note flaking of skin around buttocks Plan: -wound care consulted, recs of gerhardt's butt cream to MASD/IAD after cleansing area.     Best practice:  Diet: TF Pain/Anxiety/Delirium protocol (if indicated):precedex VAP protocol (if indicated): In place DVT prophylaxis: SCD's / Lovenox. GI prophylaxis: PPI Glucose control: Standard glycemic control Mobility: Bedrest. Last date of multidisciplinary goals of care discussion: Goals of care discussion with mother 12/17 Family and staff present: Nurse and doctor Summary of discussion: Patient is full code. Continue aggressive care measures. Follow up goals of care discussion due:  Code Status: Full  The patient is critically ill with multiple organ systems failure and requires high complexity decision making for assessment and support, frequent evaluation and titration of therapies, application of advanced monitoring technologies and extensive interpretation of multiple databases.   Critical Care Time devoted to patient care services described in this note is 32 minutes. This time reflects time of care of this signee Charlott Holler .  This critical care time does not reflect separately billable procedures or procedure time, teaching time or supervisory time of PA/NP/Med student/Med Resident etc but could involve care discussion time.  Mickel Baas Pulmonary and Critical Care Medicine 02/13/2020 7:45 AM  Pager: [phone] After hours pager: [phone]   Labs   CBC: Recent Labs  Lab 02/06/20 1119 02/07/20 0141 02/07/20 1817 02/08/20 1146 02/09/20 0048 02/10/20 0319 02/11/20 0103 02/11/20 0356 02/12/20 0235 02/12/20 0248 02/13/20 0317  WBC 26.8*   < > 22.7* 20.2* 13.7* 11.1* 13.9*  --   --  13.9* 11.3*  NEUTROABS 23.7*  --  20.0* 17.6* 11.2*  --   --   --   --   --   --   HGB 9.9*   < > 9.7* 9.4* 8.9* 8.4* 9.1* 9.5* 8.5* 8.7* 8.0*  HCT 31.3*   < > 29.1* 28.8* 27.2* 27.4* 28.2* 28.0* 25.0* 27.6* 25.9*  MCV 95.1   < > 93.9 95.0 95.4 97.9 95.9  --   --  96.8 97.4  PLT 239   < > 263 253 260 247 273  --   --  253 229   < > = values in this interval not displayed.    Basic Metabolic Panel: Recent Labs  Lab 02/06/20 1642 02/07/20 0141 02/07/20 0404 02/09/20 5035 02/10/20 0319 02/11/20 0103 02/11/20 0356 02/12/20 0235 02/12/20 0248 02/13/20 0317  NA 141 144   < > 149* 149* 146* 147* 142 144 142  K 5.5* 5.2*   < > 4.1 3.5 4.1 4.1 4.2 4.1 3.8  CL 106 105   < > 109 109 108  --   --  104 103  CO2 23 24   < > 29 30 28   --   --  29 29  GLUCOSE 249* 161*   < > 143* 134* 96  --   --  143* 69*  BUN 82* 82*   < > 63* 58* 43*  --   --  42* 36*  CREATININE 1.87* 2.03*   < > 1.64* 1.31* 1.27*  --   --  1.41* 1.16  CALCIUM 9.2 9.5   < > 9.6 9.7 9.6  --   --  9.4 9.3  MG  --  2.3  --   --   --  1.9  --   --  1.9  --   PHOS 4.6 5.5*  --   --   --  3.3  --   --  4.2  --    < > = values in this interval not displayed.   GFR: Estimated Creatinine Clearance: 100.5 mL/min (by C-G formula based on SCr of 1.16 mg/dL). Recent Labs  Lab 02/10/20 0319 02/11/20 0103 02/12/20 0248 02/13/20 0317  WBC 11.1* 13.9* 13.9* 11.3*   Liver Function Tests: Recent Labs  Lab 02/09/20 0718 02/10/20 0319 02/11/20 0103 02/12/20 0248 02/13/20 0317  AST 48* 41 55* 46* 42*  ALT 48* 46* 55* 58* 52*  ALKPHOS 116 106 118 124  96  BILITOT 0.6 1.0 0.8 0.9 0.6  PROT 6.3* 6.0* 6.0* 6.5 5.8*  ALBUMIN 2.1* 1.9* 2.1* 2.2* 1.9*   No results for input(s): LIPASE, AMYLASE in the last 168 hours. No results for input(s): AMMONIA in the last 168 hours. ABG    Component Value Date/Time   PHART 7.463 (H) 02/12/2020 0235   PCO2ART 45.6 02/12/2020 0235   PO2ART 52 (L) 02/12/2020 0235   HCO3 32.7 (H) 02/12/2020  0235   TCO2 34 (H) 02/12/2020 0235   ACIDBASEDEF 5.0 (H) 01/26/2020 2111   O2SAT 88.0 02/12/2020 0235     Coagulation Profile: No results for input(s): INR, PROTIME in the last 168 hours.  Cardiac Enzymes: No results for input(s): CKTOTAL, CKMB, CKMBINDEX, TROPONINI in the last 168 hours.  HbA1C: Hgb A1c MFr Bld  Date/Time Value Ref Range Status  01/25/2020 05:40 PM 13.4 (H) 4.8 - 5.6 % Final    Comment:    (NOTE) Pre diabetes:          5.7%-6.4%  Diabetes:              >6.4%  Glycemic control for   <7.0% adults with diabetes     CBG: Recent Labs  Lab 02/12/20 1638 02/12/20 1938 02/12/20 2347 02/13/20 0342 02/13/20 0706  GLUCAP 125* 150* 72 85 47*

## 2020-02-13 NOTE — Progress Notes (Signed)
proHypoglycemic Event  CBG: 47  Treatment: D50 50 mL (25 gm)  Symptoms: None  Follow-up CBG: Time:0752 CBG Result:107  Possible Reasons for Event: Inadequate meal intake (TF stopped for possible tracheotomy)  Comments/MD notified: Followed ICU hypoglycemia standing orders   Chauncy Passy

## 2020-02-13 NOTE — Transfer of Care (Signed)
Immediate Anesthesia Transfer of Care Note  Patient: Bryan [name]  Procedure(s) Performed: TRACHEOSTOMY (N/A Neck)  Patient Location: ICU  Anesthesia Type:General  Level of Consciousness: Patient remains intubated per anesthesia plan  Airway & Oxygen Therapy: Patient placed on Ventilator (see vital sign flow sheet for setting)  Post-op Assessment: Report given to RN and Post -op Vital signs reviewed and stable  Post vital signs: Reviewed and stable  Last Vitals:  Vitals Value Taken Time  BP    Temp    Pulse    Resp    SpO2      Last Pain:  Vitals:   02/13/20 1200  TempSrc: Axillary  PainSc:          Complications: No complications documented.

## 2020-02-14 LAB — CBC
HCT: 26.5 % — ABNORMAL LOW (ref 39.0–52.0)
Hemoglobin: 8.3 g/dL — ABNORMAL LOW (ref 13.0–17.0)
MCH: 29.9 pg (ref 26.0–34.0)
MCHC: 31.3 g/dL (ref 30.0–36.0)
MCV: 95.3 fL (ref 80.0–100.0)
Platelets: 247 10*3/uL (ref 150–400)
RBC: 2.78 MIL/uL — ABNORMAL LOW (ref 4.22–5.81)
RDW: 13.9 % (ref 11.5–15.5)
WBC: 8 10*3/uL (ref 4.0–10.5)
nRBC: 0 % (ref 0.0–0.2)

## 2020-02-14 LAB — GLUCOSE, CAPILLARY
Glucose-Capillary: 206 mg/dL — ABNORMAL HIGH (ref 70–99)
Glucose-Capillary: 192 mg/dL — ABNORMAL HIGH (ref 70–99)
Glucose-Capillary: 161 mg/dL — ABNORMAL HIGH (ref 70–99)
Glucose-Capillary: 132 mg/dL — ABNORMAL HIGH (ref 70–99)
Glucose-Capillary: 132 mg/dL — ABNORMAL HIGH (ref 70–99)
Glucose-Capillary: 131 mg/dL — ABNORMAL HIGH (ref 70–99)

## 2020-02-14 LAB — URINALYSIS, ROUTINE W REFLEX MICROSCOPIC
Bilirubin Urine: NEGATIVE
Glucose, UA: 50 mg/dL — AB
Hgb urine dipstick: NEGATIVE
Ketones, ur: NEGATIVE mg/dL
Leukocytes,Ua: NEGATIVE
Nitrite: NEGATIVE
Protein, ur: NEGATIVE mg/dL
Specific Gravity, Urine: 1.016 (ref 1.005–1.030)
pH: 6 (ref 5.0–8.0)

## 2020-02-14 LAB — COMPREHENSIVE METABOLIC PANEL
ALT: 52 U/L — ABNORMAL HIGH (ref 0–44)
AST: 37 U/L (ref 15–41)
Albumin: 1.9 g/dL — ABNORMAL LOW (ref 3.5–5.0)
Alkaline Phosphatase: 96 U/L (ref 38–126)
Anion gap: 9 (ref 5–15)
BUN: 29 mg/dL — ABNORMAL HIGH (ref 6–20)
CO2: 25 mmol/L (ref 22–32)
Calcium: 8.9 mg/dL (ref 8.9–10.3)
Chloride: 102 mmol/L (ref 98–111)
Creatinine, Ser: 1.06 mg/dL (ref 0.61–1.24)
GFR, Estimated: >60 mL/min (ref >60)
Glucose, Bld: 251 mg/dL — ABNORMAL HIGH (ref 70–99)
Potassium: 3.9 mmol/L (ref 3.5–5.1)
Sodium: 136 mmol/L (ref 135–145)
Total Bilirubin: 0.7 mg/dL (ref 0.3–1.2)
Total Protein: 5.9 g/dL — ABNORMAL LOW (ref 6.5–8.1)

## 2020-02-14 MED ORDER — INSULIN DETEMIR 100 UNIT/ML ~~LOC~~ SOLN
40.0000 [IU] | Freq: Two times a day (BID) | SUBCUTANEOUS | Status: DC
  Administered 2020-02-14 – 2020-03-08 (×46): 40 [IU] via SUBCUTANEOUS
  Filled 2020-02-14 (×51): qty 0.4

## 2020-02-14 MED ORDER — QUETIAPINE FUMARATE 100 MG PO TABS
100.0000 mg | ORAL_TABLET | Freq: Every day | ORAL | Status: DC
  Administered 2020-02-14 – 2020-03-02 (×18): 100 mg
  Filled 2020-02-14 (×18): qty 1

## 2020-02-14 MED ORDER — LINEZOLID 600 MG/300ML IV SOLN
600.0000 mg | Freq: Two times a day (BID) | INTRAVENOUS | Status: DC
  Administered 2020-02-14 – 2020-02-16 (×4): 600 mg via INTRAVENOUS
  Filled 2020-02-14 (×4): qty 300

## 2020-02-14 MED ORDER — QUETIAPINE FUMARATE 100 MG PO TABS
100.0000 mg | ORAL_TABLET | Freq: Every morning | ORAL | Status: DC
  Administered 2020-02-14 – 2020-03-02 (×18): 100 mg
  Filled 2020-02-14 (×18): qty 1

## 2020-02-14 MED ORDER — SODIUM CHLORIDE 0.9 % IV SOLN
2.0000 g | Freq: Three times a day (TID) | INTRAVENOUS | Status: DC
  Administered 2020-02-14 – 2020-02-16 (×5): 2 g via INTRAVENOUS
  Filled 2020-02-14 (×5): qty 2

## 2020-02-14 NOTE — Progress Notes (Signed)
Sputum collected and send to the lab

## 2020-02-14 NOTE — Progress Notes (Signed)
Pt was placed back on PRVC. RR rate was 33, and HR was 156. Pt is now resting more comfortably.

## 2020-02-14 NOTE — Progress Notes (Signed)
Subjective: Resting comfortably in bed. On vent via trach.  Objective: Vital signs in last 24 hours: Temp:  [98.1 F (36.7 C)-99.4 F (37.4 C)] 99.4 F (37.4 C) (12/18 2303) Pulse Rate:  [69-133] 75 (12/19 0200) Resp:  [15-25] 20 (12/19 0200) BP: (84-130)/(57-106) 98/67 (12/19 0200) SpO2:  [94 %-100 %] 100 % (12/19 0200) FiO2 (%):  [40 %] 40 % (12/19 0000) Weight:  [106.8 kg] 106.8 kg (12/18 0406)  Physical Exam: General appearance: On vent, unresponsive Head: Normocephalic, without obvious abnormality, atraumatic Ears: Examination of the ears shows normal auricles and external auditory canals bilaterally. Nose: Nasal examination shows normal mucosa, septum, turbinates.  Face: Facial examination shows no asymmetry.   Mouth: Normal mucosa. No injury. Neck: Trach tube in place. No bleeding. Neuro: Sedated and unresponsive.  Recent Labs    02/12/20 0248 02/13/20 0317  WBC 13.9* 11.3*  HGB 8.7* 8.0*  HCT 27.6* 25.9*  PLT 253 229   Recent Labs    02/12/20 0248 02/13/20 0317  NA 144 142  K 4.1 3.8  CL 104 103  CO2 29 29  GLUCOSE 143* 69*  BUN 42* 36*  CREATININE 1.41* 1.16  CALCIUM 9.4 9.3    Medications:  I have reviewed the patient's current medications. Scheduled: . chlorhexidine gluconate (MEDLINE KIT)  15 mL Mouth Rinse BID  . Chlorhexidine Gluconate Cloth  6 each Topical Daily  . cloNIDine  0.4 mg Per Tube Q6H  . docusate  100 mg Per Tube BID  . enoxaparin (LOVENOX) injection  40 mg Subcutaneous Q24H  . feeding supplement (PROSource TF)  45 mL Per Tube QID  . free water  200 mL Per Tube Q2H  . Gerhardt's butt cream   Topical BID  . insulin aspart  0-15 Units Subcutaneous Q4H  . insulin aspart  8 Units Subcutaneous Q4H  . mouth rinse  15 mL Mouth Rinse 10 times per day  . pantoprazole sodium  40 mg Per Tube Daily  . polyethylene glycol  17 g Per Tube BID  . QUEtiapine  300 mg Per Tube q morning - 10a   And  . QUEtiapine  300 mg Per Tube QHS    Continuous: . sodium chloride 250 mL (02/08/20 1825)  . dexmedetomidine (PRECEDEX) IV infusion 0.5 mcg/kg/hr (02/14/20 0200)  . feeding supplement (VITAL 1.5 CAL) 1,000 mL (02/14/20 0029)    Assessment/Plan: POD #1 s/p trach. On vent - Doing well. No trach issues overnight. - Routine fresh trach care. - Will change trach in approximately 1 week. - Wean vent as tolerated.    LOS: 20 days   Imer Foxworth W Dallin Mccorkel 02/14/2020, 3:15 AM

## 2020-02-14 NOTE — Progress Notes (Signed)
Pharmacy Antibiotic Note  Bryan [name] is a 44 y.o. male admitted on 01/25/2020 with acute pancreatitis and DKA treated with antibiotic course for sepsis. Pt now spiking fevers, pharmacy to restart cefepime, linezolid reordered by MD (reported rash with vancomycin). AKI has resolved.   Plan: Linezolid per MD Cefepime 2g IV q8h Follow LOT, renal function, culture data   Height: 5\' 10"  (177.8 cm) Weight: 108 kg (238 lb 1.6 oz) IBW/kg (Calculated) : 73  Temp (24hrs), Avg:100.3 F (37.9 C), Min:99.2 F (37.3 C), Max:103.1 F (39.5 C)  Recent Labs  Lab 02/10/20 0319 02/11/20 0103 02/12/20 0248 02/13/20 0317 02/14/20 0423  WBC 11.1* 13.9* 13.9* 11.3* 8.0  CREATININE 1.31* 1.27* 1.41* 1.16 1.06    Estimated Creatinine Clearance: 110.6 mL/min (by C-G formula based on SCr of 1.06 mg/dL).    Allergies  Allergen Reactions  . Fluphenazine Shortness Of Breath and Swelling    Tongue swelling, and shaking  . Coffee Bean Extract [Coffea Arabica] Swelling  . Vancomycin Rash    Bullous rash over trunk, arms, and face     Antimicrobials this admission: Vancomycin 11/30 >> 12/1; restart 12/7 >>12/11 Linezolid 12/11>>12/13; 12/19 >> Cefepime 11/30 >>12/7; 12/19 >> Merrem 12/7 >>12/13  Microbiology results: 11/30 Blood cultures - negative 11/30 MRSA PCR negative 11/30 COVID/Flu negative 12/11 BCx: negative 12/12 TA: few candida  Thank you for allowing pharmacy to be a part of this patient's care.  14/11, PharmD, BCPS, [facility] Clinical Pharmacist [phone] Please check AMION for all [facility] Pharm[facility] numbers 02/14/2020

## 2020-02-14 NOTE — Progress Notes (Signed)
eLink Physician-Brief Progress Note Patient Name: Bryan [name] DOB: 12-01-1975 MRN: [id number]   Date of Service  02/14/2020  HPI/Events of Note  Multiple issues: 1. Fever to 103.1 F, 2. Na+ in AM today = 136. Patient remains on free water flushes and 3. Nursing request for AM labs. Patient is allergic to Vancomycin.   eICU Interventions  Plan: 1. Blood cultures X 2 now. 2. Tracheal aspirate culture now. 3. UA with reflex microscopic now.  4. Zyvox 600 mg IV Q 12 hours. 5. Cefepime per pharmacy consult. 6. D/C Free water flushes Q 2 hours. 7. CBC with platelets and BMP in AM.     Intervention Category Major Interventions: Infection - evaluation and management;Other:  Lenell Antu 02/14/2020, 8:32 PM

## 2020-02-14 NOTE — Progress Notes (Addendum)
NAME:  Bryan [name], MRN:  [id number], DOB:  [birth year], LOS: 20 ADMISSION DATE:  01/25/2020, CONSULTATION DATE:  01/25/20 REFERRING MD:  Claudette Laws CHIEF COMPLAINT:  AMS    Brief History   Bryan [name] is a 44 y.o. male admitted w/ DKA on 11/29.  PCCM consulted 11/30 for agitation and borderline hypotension. Patient later had PEA cardiac arrest, 2 rounds of CPR when ROSC achieved, patient intubated. Transferred to ICU, CVL and arterial line placed.   CT Head is unremarkable except for right sinus disease. CT Chest/Abdomen/Pelvis showed evidence of moderate acute pancreatitis, mild ascites and small bilateral pleural effusions. Lipase of 315U/L on 01/26/20. No initial clear etiology for pancreatitis, mother denies patient using alcohol, CT a/p negative for dilated biliary ducts or gall stones. Triglycerides found to be 600.    Past Medical History   Past Medical History:  Diagnosis Date  . Hypertension   . Psychiatric problem    Seen at [facility] for unknown reason. Takes seroquel. History of hearing voices.  Not commandivng voices.   . Schizophrenia (HCC)   . Tremors of nervous system     Significant Hospital Events   11/29 - admitted 11/30 - PEA Arrest and transfer to ICU  Consults:  PCCM  Procedures:  11/30 ETT 11/30 A line, removed 01/28/20 11/30 Left IJ CVL 11/30 NG tube placed 12/7 Left IJ CVL removed 12/8 Foley Catheter removed  Significant Diagnostic Tests:  CXR 11/29 - neg.  CT Head 12/1 - right sinus disease, no acute intracranial pathology CT Chest/Abd/Pel 12/1 -moderate pancreatis, mild asicites. Small bilateral pleural effusions Abd x-ray 12/1 - NG tube tip and side port in proximal stomach. No bowel obstruction or free air CXR 12/1 - No pneumothorax, developing pneumonia left lower lobe. Mild bibasilar atelectasis. RUQ [place] 12/1 - GB sludge, negative acute cholecystitis. Fatty liver. Small ascites/partially visualized right pleural effusion  TTE Echo 12/1 -  Gr I diastolic dysfunction LE [place] 12/1 - No evidence DVT in either LE  CXR 01/29/20 - Lines and tubes in stable position. New infiltrate right lung base. Persistent bibasilar atelectasis.   LUE [place] 01/29/20 - No evidence of DVT  EEG 12/4>>severe diffuse encephalopathy  MRI Brain 12/5>>No acute findings   CT Abdomen 12/6 - acute pancreatitis with interval decrease in peripancreatic and upper abdominal edema since prior exam. No pancreatic hemorrhage, mass or pancreatitis-associated fluid collection.  KUB 12/09 Nonobstructive bowel gas pattern, enteric tube tip/side hole overlie the gastric body  CXR 12/10>> Improvement of right sided infiltrates/atelectasis. Left pleural effusion improving  CXR 12/14>> Mild bilateral infiltrates unchanged. No new findings. ET tube 8 cm above carina  EEG 12/16>> Moderate diffuse encephalopathy, nonspecific etiology. No seziures or epileptiform discharges  Micro Data:  11/30 Bld Cx - no growth  11/30 MRSA - negative 12/07 Trache Aspirate Cx - Abundant WBC, predom PMN. No organism on gram stain. Culture few candida albicans, no Staph aureus.  12/12 Bld Cx x2 - No growth 6 days  Antimicrobials:  01/26/2020 cefepime  01/26/2020 - d/c'd 12/1 vancomycin 02/02/2020 - DC cefepime. Vancomycin/meropenem started for 7 day course (end date 12/13) 02/06/2020 Vancomycin discontinued due to red rash and linezolid started 02/08/2020 Linezolid, Meropenem discontinued, red rash now with vesicles  Interim history/subjective:  Tracheostomy in OR with ENT yesterday. This morning calm, resting comfortably on minimal precedex.   Objective   Blood pressure 104/76, pulse 74, temperature 99.5 F (37.5 C), temperature source Axillary, resp. rate 18, height 5\' 10"  (1.778 m), weight 108  kg, SpO2 100 %.    Vent Mode: PRVC FiO2 (%):  [40 %] 40 % Set Rate:  [18 bmp] 18 bmp Vt Set:  [510 mL] 510 mL PEEP:  [5 cmH20] 5 cmH20 Plateau Pressure:  [11 cmH20-13 cmH20] 11 cmH20    Intake/Output Summary (Last 24 hours) at 02/14/2020 0836 Last data filed at 02/14/2020 0600 Gross per 24 hour  Intake 1576.37 ml  Output 1626 ml  Net -49.63 ml   Filed Weights   02/12/20 0500 02/13/20 0406 02/14/20 0406  Weight: 105.2 kg 106.8 kg 108 kg   Examination: General: Critically ill-appearing, on vent support HENT: 8.5 mm cuffed trach to vent, mild bloody secretions. Lungs: Bilateral mechanically ventilated breath sounds, no wheezing or rhonchi apprecaited Cardiovascular: RRR no mrg Abdomen: Non-distended. BS+ Extremities: SCD's in place.  Skin: raised purpura on chest, imrpoved from previous exam Neuro: Sedated, not following commands. Moving extremities. Cough/gag reflex present.  GU: Condom cath in place.   Assessment & Plan:  Acute Hypoxemic Respiratory Failure Barrier to extubation is encephalopathy Plan: Tracheostomy today with ENT -Seroquel 300 BID, QTC 12/16 was 411 -maintain full vent support with SAT/SBT as tolerated - plan is for PS trials as tolerated.  -HOB elevated 30 degrees. -plateau pressures less than 30 cm H20.  -bronchial hygiene and RT/bronchodilator protocol.   Acute Metabolic Encephalopathy Schizophrenia Patient followed by [facility]. Current outpatient regimen of abilify monthly. Mother states patient missed November dosage. Now also with ICU delirium. Plan: - continue seroquel 300 mg BID for now.   PEA Cardiac Arrest In setting of pancreatitis, DKA and severe metabolic derangements Shock resolved.  Plan: Monitor electrolytes daily Monitor telemetry  Acute Severe Pancreatitis  Unclear etiology, Tg elevated upon admission. IgG negative. Mother denies alcohol and drug use. Imaging negative for biliary stricture or cholestasis. CT a/p 12/06 no pancreatic hemorrhage, mass, or fluid collection seen. Abdomen with active bowel sounds. KUB performed 12/09, revealed nonobstructive bowel gas pattern. Plan: -appears resolved at this time -TG  improving -miralax BID -continue supportive care -tube feeds continued  Drug Rash - improving - unclear etiology, possibly abx? - will observe off medications  DKA with new diagnosis DM2 New onset in setting of pancreatitis. Off of insulin drip. Tube feeds in context of intubated state.  Plan: -titrate SSI for goal CBG 140-180. -hypoglycemia protocol in place - tube feeds resumed now post procedure, will resume basal insulin.   Acute Kidney Injury - improving Hypernatremia - improving Hyperphosphatemia Plan: -continue free water flushes -monitor renal function and electrolytes  Buttock Wound Nursing note flaking of skin around buttocks Plan: -wound care consulted, recs of gerhardt's butt cream to MASD/IAD after cleansing area.     Best practice:  Diet: TF Pain/Anxiety/Delirium protocol (if indicated):precedex VAP protocol (if indicated): In place DVT prophylaxis: SCD's / Lovenox. GI prophylaxis: PPI Glucose control: Standard glycemic control Mobility: Bedrest. Last date of multidisciplinary goals of care discussion: Goals of care discussion with mother 12/17 Family and staff present: Nurse and doctor Summary of discussion: Patient is full code. Continue aggressive care measures. Follow up goals of care discussion due:  Code Status: Full  The patient is critically ill with multiple organ systems failure and requires high complexity decision making for assessment and support, frequent evaluation and titration of therapies, application of advanced monitoring technologies and extensive interpretation of multiple databases.   Critical Care Time devoted to patient care services described in this note is 35 minutes. This time reflects time of care of this signee  Charlott HollerNikita S Hillel Card . This critical care time does not reflect separately billable procedures or procedure time, teaching time or supervisory time of PA/NP/Med student/Med Resident etc but could involve care discussion  time.  Mickel BaasNikita S Hobie Kohles [facility] 02/14/2020 8:36 AM  Pager: [phone] After hours pager: [phone]   Labs   CBC: Recent Labs  Lab 02/07/20 1817 02/08/20 1146 02/09/20 0048 02/10/20 0319 02/11/20 0103 02/11/20 0356 02/12/20 0235 02/12/20 0248 02/13/20 0317 02/14/20 0423  WBC 22.7* 20.2* 13.7* 11.1* 13.9*  --   --  13.9* 11.3* 8.0  NEUTROABS 20.0* 17.6* 11.2*  --   --   --   --   --   --   --   HGB 9.7* 9.4* 8.9* 8.4* 9.1* 9.5* 8.5* 8.7* 8.0* 8.3*  HCT 29.1* 28.8* 27.2* 27.4* 28.2* 28.0* 25.0* 27.6* 25.9* 26.5*  MCV 93.9 95.0 95.4 97.9 95.9  --   --  96.8 97.4 95.3  PLT 263 253 260 247 273  --   --  253 229 247    Basic Metabolic Panel: Recent Labs  Lab 02/10/20 0319 02/11/20 0103 02/11/20 0356 02/12/20 0235 02/12/20 0248 02/13/20 0317 02/14/20 0423  NA 149* 146* 147* 142 144 142 136  K 3.5 4.1 4.1 4.2 4.1 3.8 3.9  CL 109 108  --   --  104 103 102  CO2 30 28  --   --  29 29 25   GLUCOSE 134* 96  --   --  143* 69* 251*  BUN 58* 43*  --   --  42* 36* 29*  CREATININE 1.31* 1.27*  --   --  1.41* 1.16 1.06  CALCIUM 9.7 9.6  --   --  9.4 9.3 8.9  MG  --  1.9  --   --  1.9  --   --   PHOS  --  3.3  --   --  4.2  --   --    GFR: Estimated Creatinine Clearance: 110.6 mL/min (by C-G formula based on SCr of 1.06 mg/dL). Recent Labs  Lab 02/11/20 0103 02/12/20 0248 02/13/20 0317 02/14/20 0423  WBC 13.9* 13.9* 11.3* 8.0   Liver Function Tests: Recent Labs  Lab 02/10/20 0319 02/11/20 0103 02/12/20 0248 02/13/20 0317 02/14/20 0423  AST 41 55* 46* 42* 37  ALT 46* 55* 58* 52* 52*  ALKPHOS 106 118 124 96 96  BILITOT 1.0 0.8 0.9 0.6 0.7  PROT 6.0* 6.0* 6.5 5.8* 5.9*  ALBUMIN 1.9* 2.1* 2.2* 1.9* 1.9*   No results for input(s): LIPASE, AMYLASE in the last 168 hours. No results for input(s): AMMONIA in the last 168 hours. ABG    Component Value Date/Time   PHART 7.463 (H) 02/12/2020 0235   PCO2ART 45.6 02/12/2020 0235    PO2ART 52 (L) 02/12/2020 0235   HCO3 32.7 (H) 02/12/2020 0235   TCO2 34 (H) 02/12/2020 0235   ACIDBASEDEF 5.0 (H) 01/26/2020 2111   O2SAT 88.0 02/12/2020 0235     Coagulation Profile: No results for input(s): INR, PROTIME in the last 168 hours.  Cardiac Enzymes: No results for input(s): CKTOTAL, CKMB, CKMBINDEX, TROPONINI in the last 168 hours.  HbA1C: Hgb A1c MFr Bld  Date/Time Value Ref Range Status  01/25/2020 05:40 PM 13.4 (H) 4.8 - 5.6 % Final    Comment:    (NOTE) Pre diabetes:          5.7%-6.4%  Diabetes:              >  6.4%  Glycemic control for   <7.0% adults with diabetes     CBG: Recent Labs  Lab 02/13/20 1635 02/13/20 1954 02/13/20 2314 02/14/20 0333 02/14/20 0715  GLUCAP 96 169* 157* 206* 192*

## 2020-02-15 ENCOUNTER — Inpatient Hospital Stay (HOSPITAL_COMMUNITY): Payer: Medicaid Other

## 2020-02-15 DIAGNOSIS — R6520 Severe sepsis without septic shock: Secondary | ICD-10-CM

## 2020-02-15 LAB — BASIC METABOLIC PANEL
Anion gap: 9 (ref 5–15)
BUN: 24 mg/dL — ABNORMAL HIGH (ref 6–20)
CO2: 26 mmol/L (ref 22–32)
Calcium: 9.1 mg/dL (ref 8.9–10.3)
Chloride: 102 mmol/L (ref 98–111)
Creatinine, Ser: 1.06 mg/dL (ref 0.61–1.24)
GFR, Estimated: >60 mL/min (ref >60)
Glucose, Bld: 82 mg/dL (ref 70–99)
Potassium: 4 mmol/L (ref 3.5–5.1)
Sodium: 137 mmol/L (ref 135–145)

## 2020-02-15 LAB — CBC WITH DIFFERENTIAL/PLATELET
Abs Immature Granulocytes: 0.05 10*3/uL (ref 0.00–0.07)
Basophils Absolute: 0 10*3/uL (ref 0.0–0.1)
Basophils Relative: 0 %
Eosinophils Absolute: 0.3 10*3/uL (ref 0.0–0.5)
Eosinophils Relative: 2 %
HCT: 26.1 % — ABNORMAL LOW (ref 39.0–52.0)
Hemoglobin: 8.2 g/dL — ABNORMAL LOW (ref 13.0–17.0)
Immature Granulocytes: 1 %
Lymphocytes Relative: 18 %
Lymphs Abs: 2 10*3/uL (ref 0.7–4.0)
MCH: 30 pg (ref 26.0–34.0)
MCHC: 31.4 g/dL (ref 30.0–36.0)
MCV: 95.6 fL (ref 80.0–100.0)
Monocytes Absolute: 1.3 10*3/uL — ABNORMAL HIGH (ref 0.1–1.0)
Monocytes Relative: 12 %
Neutro Abs: 7.4 10*3/uL (ref 1.7–7.7)
Neutrophils Relative %: 67 %
Platelets: 238 10*3/uL (ref 150–400)
RBC: 2.73 MIL/uL — ABNORMAL LOW (ref 4.22–5.81)
RDW: 14.1 % (ref 11.5–15.5)
WBC: 11 10*3/uL — ABNORMAL HIGH (ref 4.0–10.5)
nRBC: 0 % (ref 0.0–0.2)

## 2020-02-15 LAB — GLUCOSE, CAPILLARY
Glucose-Capillary: 146 mg/dL — ABNORMAL HIGH (ref 70–99)
Glucose-Capillary: 156 mg/dL — ABNORMAL HIGH (ref 70–99)
Glucose-Capillary: 97 mg/dL (ref 70–99)
Glucose-Capillary: 110 mg/dL — ABNORMAL HIGH (ref 70–99)
Glucose-Capillary: 108 mg/dL — ABNORMAL HIGH (ref 70–99)
Glucose-Capillary: 213 mg/dL — ABNORMAL HIGH (ref 70–99)

## 2020-02-15 MED ORDER — POLYETHYLENE GLYCOL 3350 17 G PO PACK
17.0000 g | PACK | Freq: Every day | ORAL | Status: DC | PRN
  Administered 2020-02-16: 08:00:00 17 g
  Filled 2020-02-15: qty 1

## 2020-02-15 MED ORDER — CLONIDINE HCL 0.3 MG PO TABS
0.3000 mg | ORAL_TABLET | Freq: Four times a day (QID) | ORAL | Status: DC
  Administered 2020-02-15 – 2020-02-18 (×12): 0.3 mg
  Filled 2020-02-15 (×12): qty 1

## 2020-02-15 NOTE — Progress Notes (Signed)
Subjective: Sedated. On vent. No trach issues.  Objective: Vital signs in last 24 hours: Temp:  [98.9 F (37.2 C)-103.1 F (39.5 C)] 99.3 F (37.4 C) (12/20 0700) Pulse Rate:  [71-149] 71 (12/20 0700) Resp:  [13-29] 18 (12/20 0700) BP: (93-142)/(62-129) 102/70 (12/20 0700) SpO2:  [97 %-100 %] 100 % (12/20 0700) FiO2 (%):  [40 %] 40 % (12/20 0715) Weight:  [109.2 kg] 109.2 kg (12/20 0349)  Physical Exam: General appearance:On vent, unresponsive Head:Normocephalic, without obvious abnormality, atraumatic Ears: Examination of the ears shows normal auricles and external auditory canals bilaterally. Nose: Nasal examination shows normal mucosa, septum, turbinates.  Face: Facial examination shows no asymmetry.  Mouth:Normal mucosa. No injury. Neck:Trach tube in place. No bleeding. Neuro:Sedated and unresponsive.  Recent Labs    02/14/20 0423 02/15/20 0127  WBC 8.0 11.0*  HGB 8.3* 8.2*  HCT 26.5* 26.1*  PLT 247 238   Recent Labs    02/14/20 0423 02/15/20 0127  NA 136 137  K 3.9 4.0  CL 102 102  CO2 25 26  GLUCOSE 251* 82  BUN 29* 24*  CREATININE 1.06 1.06  CALCIUM 8.9 9.1    Medications:  I have reviewed the patient's current medications. Scheduled: . chlorhexidine gluconate (MEDLINE KIT)  15 mL Mouth Rinse BID  . Chlorhexidine Gluconate Cloth  6 each Topical Daily  . cloNIDine  0.3 mg Per Tube Q6H  . docusate  100 mg Per Tube BID  . enoxaparin (LOVENOX) injection  40 mg Subcutaneous Q24H  . feeding supplement (PROSource TF)  45 mL Per Tube QID  . Gerhardt's butt cream   Topical BID  . insulin aspart  0-15 Units Subcutaneous Q4H  . insulin aspart  8 Units Subcutaneous Q4H  . insulin detemir  40 Units Subcutaneous BID  . mouth rinse  15 mL Mouth Rinse 10 times per day  . pantoprazole sodium  40 mg Per Tube Daily  . QUEtiapine  100 mg Per Tube q morning - 10a   And  . QUEtiapine  100 mg Per Tube QHS   Continuous: . sodium chloride 250 mL (02/08/20  1825)  . ceFEPime (MAXIPIME) IV 200 mL/hr at 02/15/20 0600  . dexmedetomidine (PRECEDEX) IV infusion 0.4 mcg/kg/hr (02/15/20 0948)  . feeding supplement (VITAL 1.5 CAL) 1,000 mL (02/14/20 2032)  . linezolid (ZYVOX) IV Stopped (02/14/20 2201)    Assessment/Plan: POD #2 s/p trach. On vent - No trach issues overnight. - Routine fresh trach care. - Will change trach early next week. - Wean vent as tolerated.    LOS: 21 days   Varun Jourdan W Toron Bowring 02/15/2020, 10:00 AM

## 2020-02-15 NOTE — Progress Notes (Addendum)
NAME:  Bryan [name], MRN:  [id number], DOB:  09/02/1975, LOS: 21 ADMISSION DATE:  01/25/2020, CONSULTATION DATE:  01/25/20 REFERRING MD:  Claudette Laws CHIEF COMPLAINT:  AMS    Brief History   Bryan [name] is a 44 y.o. male admitted w/ DKA on 11/29.  PCCM consulted 11/30 for agitation and borderline hypotension. Patient later had PEA cardiac arrest, 2 rounds of CPR when ROSC achieved, patient intubated. Transferred to ICU, CVL and arterial line placed.   CT Head is unremarkable except for right sinus disease. CT Chest/Abdomen/Pelvis showed evidence of moderate acute pancreatitis, mild ascites and small bilateral pleural effusions. Lipase of 315U/L on 01/26/20. No initial clear etiology for pancreatitis, mother denies patient using alcohol, CT a/p negative for dilated biliary ducts or gall stones. Triglycerides found to be 600.    Past Medical History   Past Medical History:  Diagnosis Date  . Hypertension   . Psychiatric problem    Seen at [facility] for unknown reason. Takes seroquel. History of hearing voices.  Not commandivng voices.   . Schizophrenia (HCC)   . Tremors of nervous system     Significant Hospital Events   11/29 - admitted 11/30 - PEA Arrest and transfer to ICU  Consults:  PCCM  Procedures:  11/30 ETT 11/30 A line, removed 01/28/20 11/30 Left IJ CVL 11/30 NG tube placed 12/7 Left IJ CVL removed 12/8 Foley Catheter removed 12/18 Trach placed  Significant Diagnostic Tests:  CXR 11/29 - neg.  CT Head 12/1 - right sinus disease, no acute intracranial pathology CT Chest/Abd/Pel 12/1 -moderate pancreatis, mild asicites. Small bilateral pleural effusions Abd x-ray 12/1 - NG tube tip and side port in proximal stomach. No bowel obstruction or free air CXR 12/1 - No pneumothorax, developing pneumonia left lower lobe. Mild bibasilar atelectasis. RUQ [place] 12/1 - GB sludge, negative acute cholecystitis. Fatty liver. Small ascites/partially visualized right pleural  effusion  TTE Echo 12/1 - Gr I diastolic dysfunction LE [place] 12/1 - No evidence DVT in either LE  CXR 01/29/20 - Lines and tubes in stable position. New infiltrate right lung base. Persistent bibasilar atelectasis.   LUE [place] 01/29/20 - No evidence of DVT  EEG 12/4>>severe diffuse encephalopathy  MRI Brain 12/5>>No acute findings   CT Abdomen 12/6 - acute pancreatitis with interval decrease in peripancreatic and upper abdominal edema since prior exam. No pancreatic hemorrhage, mass or pancreatitis-associated fluid collection.  KUB 12/09 Nonobstructive bowel gas pattern, enteric tube tip/side hole overlie the gastric body  CXR 12/10>> Improvement of right sided infiltrates/atelectasis. Left pleural effusion improving  CXR 12/14>> Mild bilateral infiltrates unchanged. No new findings. ET tube 8 cm above carina  EEG 12/16>> Moderate diffuse encephalopathy, nonspecific etiology. No seziures or epileptiform discharges  CXR 12/20 > Pending  Micro Data:  11/30 Bld Cx - no growth  11/30 MRSA - negative 12/07 Trache Aspirate Cx - Abundant WBC, predom PMN. No organism on gram stain. Culture few candida albicans, no Staph aureus.  12/12 Bld Cx x2 - No growth 6 days 12/19 Bld Cx x2 - negative >12 hrs 12/19 Respiratory culture - moderate WBC, predominantly PMN, few gram + cocci  Antimicrobials:  01/26/2020 cefepime  01/26/2020 - d/c'd 12/1 vancomycin 02/02/2020 - DC cefepime. Vancomycin/meropenem started for 7 day course (end date 12/13) 02/06/2020 Vancomycin discontinued due to red rash and linezolid started 02/08/2020 Linezolid, Meropenem discontinued, red rash now with vesicles 02/14/2020 Linezolid,Cefepime restarted  Interim history/subjective:  Overnight patient noted to have a fever of 39.5 C (103  F). BCx2, tracheal aspirate culture, UA drawn. Started on linezolid and cefepime. Free water flushes also dc'd, pt with Na of 136.   Patient resting comfortably in bed, eyes open, tracking.  When asked to move upper extremity, he is able to. On precedex .8.  Objective   Blood pressure 96/64, pulse 74, temperature 98.9 F (37.2 C), temperature source Oral, resp. rate 20, height 5\' 10"  (1.778 m), weight 109.2 kg, SpO2 99 %.    Vent Mode: PRVC FiO2 (%):  [40 %] 40 % Set Rate:  [18 bmp] 18 bmp Vt Set:  [510 mL] 510 mL PEEP:  [5 cmH20] 5 cmH20 Pressure Support:  [12 cmH20] 12 cmH20 Plateau Pressure:  [10 cmH20-14 cmH20] 13 cmH20   Intake/Output Summary (Last 24 hours) at 02/15/2020 0658 Last data filed at 02/15/2020 02/17/2020 Gross per 24 hour  Intake 3436.33 ml  Output 2700 ml  Net 736.33 ml   Filed Weights   02/13/20 0406 02/14/20 0406 02/15/20 0349  Weight: 106.8 kg 108 kg 109.2 kg   Examination: General: Critically ill-appearing, on vent support HENT: 8.5 mm cuffed trach to vent Lungs: Bilateral mechanically ventilated breath sounds, no wheezing or rhonchi apprecaited Cardiovascular: RRR no murmurs, gallops, rubs Abdomen: Non-distended. BS+.  Extremities: SCD's in place.  Skin: Dry skin of bilateral cubital fossa.  Neuro: Patient tracking with eyes. Able to lift arm when asked. Not squeezing finger or moving toes when asked. Responds to pain. GU: Condom cath in place.   Assessment & Plan:  Acute Hypoxemic Respiratory Failure Trache placement 12/20 via ENT. Patient more alert this morning on precedex .8. Continue to wean on vent and decrease precedex requirement.  Plan: -continue to wean off of precedex (cap decreased from .8 to .4) and continue clonidine .3 q6h.  -Seroquel 100 BID, QTC 12/16 was 411 -maintain full vent support with SAT/SBT as tolerated -plan is for PS trials as tolerated.  -HOB elevated 30 degrees. -plateau pressures less than 30 cm H20.  -bronchial hygiene and RT/bronchodilator protocol.  Fever Leukocytosis Patient with overnight fever of 103F. Patient has had fluctuating temperatures since his initial admission. Abx stopped 12/13 due to  suspected drug rash. Vancomycin thought to be causing agent. WBC of 11 this am, post op leukocytosis (trach placed 12/18) vs infectious etiology.  Plan - tylenol q6h PRN for fevers - started on linezolid/cefepime 12/19 - BC x 2 negative thus far - tracheal aspirate few WBC, PMN's. Culture pending - UC negative leuks/nitrites - continue to trend WBC   Acute Metabolic Encephalopathy Schizophrenia Patient followed by 1/20. Current outpatient regimen of abilify monthly. Mother states patient missed November dosage. Now suspected to have ICU delirium. Plan: - continue seroquel 100 mg BID for now.   PEA Cardiac Arrest In setting of pancreatitis, DKA and severe metabolic derangements Shock resolved.  Plan: Monitor electrolytes daily Monitor telemetry  Acute Severe Pancreatitis  Unclear etiology, Tg elevated upon admission. IgG negative. Mother denies alcohol and drug use. Imaging negative for biliary stricture or cholestasis. CT a/p 12/06 no pancreatic hemorrhage, mass, or fluid collection seen. Abdomen with active bowel sounds. KUB performed 12/09, revealed nonobstructive bowel gas pattern. Plan: -appears resolved at this time -TG improved -miralax BID -continue supportive care -tube feeds continued  Drug Rash - improving - abx restarted yesterday evening for fever. Will monitor for rash - unclear etiology, suspected to be secondary to vancomycin  DKA with new diagnosis DM2 New onset in setting of pancreatitis. Off of insulin drip. Tube feeds in  context of intubated state.  Plan: -titrate SSI for goal CBG 140-180. -hypoglycemia protocol in place - tube feeds resumed now post procedure, will resume basal insulin.   Acute Kidney Injury - improving Hypernatremia - resolved Hyperphosphatemia Na of 136 overnight, cr 1.06. Last phos 12/17 4.2.  Plan: -no longer receiving free water flushes -monitor renal function and electrolytes  Buttock Wound Nursing note flaking of skin  around buttocks Plan: -wound care consulted, recs of gerhardt's butt cream to MASD/IAD after cleansing area.    Best practice:  Diet: TF Pain/Anxiety/Delirium protocol (if indicated): wean off of precedex today VAP protocol (if indicated): In place DVT prophylaxis: SCD's / Lovenox. GI prophylaxis: PPI Glucose control: Standard glycemic control Mobility: Bedrest. Last date of multidisciplinary goals of care discussion: Goals of care discussion with mother 12/17 Family and staff present: Nurse and doctor Summary of discussion: Patient is full code. Continue aggressive care measures. Follow up goals of care discussion due:  Code Status: Full  Thalia Bloodgood DO  Internal Medicine Resident PGY-1 [place]  Pager: [phone]    Labs   CBC: Recent Labs  Lab 02/08/20 1146 02/09/20 0048 02/10/20 0319 02/11/20 0103 02/11/20 0356 02/12/20 0235 02/12/20 0248 02/13/20 0317 02/14/20 0423 02/15/20 0127  WBC 20.2* 13.7*   < > 13.9*  --   --  13.9* 11.3* 8.0 11.0*  NEUTROABS 17.6* 11.2*  --   --   --   --   --   --   --  7.4  HGB 9.4* 8.9*   < > 9.1*   < > 8.5* 8.7* 8.0* 8.3* 8.2*  HCT 28.8* 27.2*   < > 28.2*   < > 25.0* 27.6* 25.9* 26.5* 26.1*  MCV 95.0 95.4   < > 95.9  --   --  96.8 97.4 95.3 95.6  PLT 253 260   < > 273  --   --  253 229 247 238   < > = values in this interval not displayed.    Basic Metabolic Panel: Recent Labs  Lab 02/11/20 0103 02/11/20 0356 02/12/20 0235 02/12/20 0248 02/13/20 0317 02/14/20 0423 02/15/20 0127  NA 146*   < > 142 144 142 136 137  K 4.1   < > 4.2 4.1 3.8 3.9 4.0  CL 108  --   --  104 103 102 102  CO2 28  --   --  29 29 25 26   GLUCOSE 96  --   --  143* 69* 251* 82  BUN 43*  --   --  42* 36* 29* 24*  CREATININE 1.27*  --   --  1.41* 1.16 1.06 1.06  CALCIUM 9.6  --   --  9.4 9.3 8.9 9.1  MG 1.9  --   --  1.9  --   --   --   PHOS 3.3  --   --  4.2  --   --   --    < > = values in this interval not displayed.    GFR: Estimated Creatinine Clearance: 111.2 mL/min (by C-G formula based on SCr of 1.06 mg/dL). Recent Labs  Lab 02/12/20 0248 02/13/20 0317 02/14/20 0423 02/15/20 0127  WBC 13.9* 11.3* 8.0 11.0*   Liver Function Tests: Recent Labs  Lab 02/10/20 0319 02/11/20 0103 02/12/20 0248 02/13/20 0317 02/14/20 0423  AST 41 55* 46* 42* 37  ALT 46* 55* 58* 52* 52*  ALKPHOS 106 118 124 96 96  BILITOT 1.0 0.8 0.9 0.6  0.7  PROT 6.0* 6.0* 6.5 5.8* 5.9*  ALBUMIN 1.9* 2.1* 2.2* 1.9* 1.9*   No results for input(s): LIPASE, AMYLASE in the last 168 hours. No results for input(s): AMMONIA in the last 168 hours. ABG    Component Value Date/Time   PHART 7.463 (H) 02/12/2020 0235   PCO2ART 45.6 02/12/2020 0235   PO2ART 52 (L) 02/12/2020 0235   HCO3 32.7 (H) 02/12/2020 0235   TCO2 34 (H) 02/12/2020 0235   ACIDBASEDEF 5.0 (H) 01/26/2020 2111   O2SAT 88.0 02/12/2020 0235     Coagulation Profile: No results for input(s): INR, PROTIME in the last 168 hours.  Cardiac Enzymes: No results for input(s): CKTOTAL, CKMB, CKMBINDEX, TROPONINI in the last 168 hours.  HbA1C: Hgb A1c MFr Bld  Date/Time Value Ref Range Status  01/25/2020 05:40 PM 13.4 (H) 4.8 - 5.6 % Final    Comment:    (NOTE) Pre diabetes:          5.7%-6.4%  Diabetes:              >6.4%  Glycemic control for   <7.0% adults with diabetes     CBG: Recent Labs  Lab 02/14/20 1138 02/14/20 1553 02/14/20 1911 02/14/20 2322 02/15/20 0352  GLUCAP 161* 132* 132* 131* 146*

## 2020-02-16 DIAGNOSIS — L8990 Pressure ulcer of unspecified site, unspecified stage: Secondary | ICD-10-CM | POA: Diagnosis present

## 2020-02-16 LAB — CBC WITH DIFFERENTIAL/PLATELET
Abs Immature Granulocytes: 0.05 10*3/uL (ref 0.00–0.07)
Basophils Absolute: 0 10*3/uL (ref 0.0–0.1)
Basophils Relative: 0 %
Eosinophils Absolute: 0.3 10*3/uL (ref 0.0–0.5)
Eosinophils Relative: 3 %
HCT: 25.5 % — ABNORMAL LOW (ref 39.0–52.0)
Hemoglobin: 8 g/dL — ABNORMAL LOW (ref 13.0–17.0)
Immature Granulocytes: 1 %
Lymphocytes Relative: 15 %
Lymphs Abs: 1.7 10*3/uL (ref 0.7–4.0)
MCH: 30.1 pg (ref 26.0–34.0)
MCHC: 31.4 g/dL (ref 30.0–36.0)
MCV: 95.9 fL (ref 80.0–100.0)
Monocytes Absolute: 1 10*3/uL (ref 0.1–1.0)
Monocytes Relative: 9 %
Neutro Abs: 8 10*3/uL — ABNORMAL HIGH (ref 1.7–7.7)
Neutrophils Relative %: 72 %
Platelets: 246 10*3/uL (ref 150–400)
RBC: 2.66 MIL/uL — ABNORMAL LOW (ref 4.22–5.81)
RDW: 14.1 % (ref 11.5–15.5)
WBC: 11.1 10*3/uL — ABNORMAL HIGH (ref 4.0–10.5)
nRBC: 0 % (ref 0.0–0.2)

## 2020-02-16 LAB — COMPREHENSIVE METABOLIC PANEL
ALT: 66 U/L — ABNORMAL HIGH (ref 0–44)
AST: 55 U/L — ABNORMAL HIGH (ref 15–41)
Albumin: 2 g/dL — ABNORMAL LOW (ref 3.5–5.0)
Alkaline Phosphatase: 119 U/L (ref 38–126)
Anion gap: 10 (ref 5–15)
BUN: 21 mg/dL — ABNORMAL HIGH (ref 6–20)
CO2: 23 mmol/L (ref 22–32)
Calcium: 8.9 mg/dL (ref 8.9–10.3)
Chloride: 101 mmol/L (ref 98–111)
Creatinine, Ser: 1.05 mg/dL (ref 0.61–1.24)
GFR, Estimated: >60 mL/min (ref >60)
Glucose, Bld: 100 mg/dL — ABNORMAL HIGH (ref 70–99)
Potassium: 3.8 mmol/L (ref 3.5–5.1)
Sodium: 134 mmol/L — ABNORMAL LOW (ref 135–145)
Total Bilirubin: 0.3 mg/dL (ref 0.3–1.2)
Total Protein: 5.7 g/dL — ABNORMAL LOW (ref 6.5–8.1)

## 2020-02-16 LAB — BLOOD CULTURE ID PANEL (REFLEXED) - BCID2

## 2020-02-16 LAB — CULTURE, RESPIRATORY W GRAM STAIN

## 2020-02-16 LAB — GLUCOSE, CAPILLARY
Glucose-Capillary: 96 mg/dL (ref 70–99)
Glucose-Capillary: 89 mg/dL (ref 70–99)
Glucose-Capillary: 137 mg/dL — ABNORMAL HIGH (ref 70–99)
Glucose-Capillary: 73 mg/dL (ref 70–99)
Glucose-Capillary: 134 mg/dL — ABNORMAL HIGH (ref 70–99)
Glucose-Capillary: 131 mg/dL — ABNORMAL HIGH (ref 70–99)

## 2020-02-16 LAB — TRIGLYCERIDES: Triglycerides: 188 mg/dL — ABNORMAL HIGH (ref <150)

## 2020-02-16 MED ORDER — GUAIFENESIN 100 MG/5ML PO SOLN
5.0000 mL | ORAL | Status: DC
  Administered 2020-02-16 – 2020-03-03 (×89): 100 mg
  Filled 2020-02-16 (×2): qty 10
  Filled 2020-02-16: qty 5
  Filled 2020-02-16: qty 10
  Filled 2020-02-16: qty 5
  Filled 2020-02-16 (×4): qty 10
  Filled 2020-02-16: qty 5
  Filled 2020-02-16: qty 10
  Filled 2020-02-16: qty 5
  Filled 2020-02-16 (×3): qty 10
  Filled 2020-02-16: qty 5
  Filled 2020-02-16: qty 10
  Filled 2020-02-16: qty 5
  Filled 2020-02-16 (×9): qty 10
  Filled 2020-02-16: qty 5
  Filled 2020-02-16 (×4): qty 10
  Filled 2020-02-16: qty 5
  Filled 2020-02-16 (×9): qty 10
  Filled 2020-02-16: qty 5
  Filled 2020-02-16 (×5): qty 10
  Filled 2020-02-16 (×2): qty 5
  Filled 2020-02-16: qty 10
  Filled 2020-02-16: qty 5
  Filled 2020-02-16 (×7): qty 10
  Filled 2020-02-16 (×2): qty 5
  Filled 2020-02-16 (×3): qty 10
  Filled 2020-02-16 (×3): qty 5
  Filled 2020-02-16 (×3): qty 10
  Filled 2020-02-16: qty 5
  Filled 2020-02-16 (×7): qty 10
  Filled 2020-02-16: qty 5
  Filled 2020-02-16 (×11): qty 10

## 2020-02-16 MED ORDER — SODIUM CHLORIDE 0.9 % IV SOLN
2.0000 g | INTRAVENOUS | Status: AC
  Administered 2020-02-16 – 2020-02-20 (×5): 2 g via INTRAVENOUS
  Filled 2020-02-16 (×6): qty 20

## 2020-02-16 NOTE — Progress Notes (Signed)
PHARMACY - PHYSICIAN COMMUNICATION CRITICAL VALUE ALERT - BLOOD CULTURE IDENTIFICATION (BCID)  Bryan [name] is an 44 y.o. male who presented to [facility] on 01/25/2020 with a chief complaint of cardiac arrest, VDRF/PNA, new fevers on 12/19  Assessment:   1/2 blood cultures growing methicillin resistant Staphylococcus epidermidis  Name of physician (or Provider) Contacted:  Dr. Arsenio Loader  Current antibiotics: Cefepime and Zyvox  Changes to prescribed antibiotics recommended:  No changes at this time  Results for orders placed or performed during the hospital encounter of 01/25/20  Blood Culture ID Panel (Reflexed) (Collected: 02/14/2020  9:04 PM)  Result Value Ref Range   Enterococcus faecalis NOT DETECTED NOT DETECTED   Enterococcus Faecium NOT DETECTED NOT DETECTED   Listeria monocytogenes NOT DETECTED NOT DETECTED   Staphylococcus species DETECTED (A) NOT DETECTED   Staphylococcus aureus (BCID) NOT DETECTED NOT DETECTED   Staphylococcus epidermidis DETECTED (A) NOT DETECTED   Staphylococcus lugdunensis NOT DETECTED NOT DETECTED   Streptococcus species NOT DETECTED NOT DETECTED   Streptococcus agalactiae NOT DETECTED NOT DETECTED   Streptococcus pneumoniae NOT DETECTED NOT DETECTED   Streptococcus pyogenes NOT DETECTED NOT DETECTED   A.calcoaceticus-baumannii NOT DETECTED NOT DETECTED   Bacteroides fragilis NOT DETECTED NOT DETECTED   Enterobacterales NOT DETECTED NOT DETECTED   Enterobacter cloacae complex NOT DETECTED NOT DETECTED   Escherichia coli NOT DETECTED NOT DETECTED   Klebsiella aerogenes NOT DETECTED NOT DETECTED   Klebsiella oxytoca NOT DETECTED NOT DETECTED   Klebsiella pneumoniae NOT DETECTED NOT DETECTED   Proteus species NOT DETECTED NOT DETECTED   Salmonella species NOT DETECTED NOT DETECTED   Serratia marcescens NOT DETECTED NOT DETECTED   Haemophilus influenzae NOT DETECTED NOT DETECTED   Neisseria meningitidis NOT DETECTED NOT DETECTED   Pseudomonas  aeruginosa NOT DETECTED NOT DETECTED   Stenotrophomonas maltophilia NOT DETECTED NOT DETECTED   Candida albicans NOT DETECTED NOT DETECTED   Candida auris NOT DETECTED NOT DETECTED   Candida glabrata NOT DETECTED NOT DETECTED   Candida krusei NOT DETECTED NOT DETECTED   Candida parapsilosis NOT DETECTED NOT DETECTED   Candida tropicalis NOT DETECTED NOT DETECTED   Cryptococcus neoformans/gattii NOT DETECTED NOT DETECTED   Methicillin resistance mecA/C DETECTED (A) NOT DETECTED    [name] 02/16/2020  4:25 AM

## 2020-02-16 NOTE — Progress Notes (Signed)
NAME:  Bryan [name], MRN:  [id number], DOB:  [birth year], LOS: 22 ADMISSION DATE:  01/25/2020, CONSULTATION DATE:  01/25/20 REFERRING MD:  Claudette Laws CHIEF COMPLAINT:  AMS    Brief History   Bryan [name] is a 44 y.o. male admitted w/ DKA on 11/29.  PCCM consulted 11/30 for agitation and borderline hypotension. Patient later had PEA cardiac arrest, 2 rounds of CPR when ROSC achieved, patient intubated. Transferred to ICU, CVL and arterial line placed.   CT Head is unremarkable except for right sinus disease. CT Chest/Abdomen/Pelvis showed evidence of moderate acute pancreatitis, mild ascites and small bilateral pleural effusions. Lipase of 315U/L on 01/26/20. No initial clear etiology for pancreatitis, mother denies patient using alcohol, CT a/p negative for dilated biliary ducts or gall stones. Triglycerides found to be 600.    Past Medical History   Past Medical History:  Diagnosis Date   Hypertension    Psychiatric problem    Seen at [facility] for unknown reason. Takes seroquel. History of hearing voices.  Not commandivng voices.    Schizophrenia (HCC)    Tremors of nervous system     Significant Hospital Events   11/29 - admitted 11/30 - PEA Arrest and transfer to ICU  Consults:  PCCM  Procedures:  11/30 ETT 11/30 A line, removed 01/28/20 11/30 Left IJ CVL 11/30 NG tube placed 12/7 Left IJ CVL removed 12/8 Foley Catheter removed 12/18 Trach placed  Significant Diagnostic Tests:  CXR 11/29 - neg.  CT Head 12/1 - right sinus disease, no acute intracranial pathology CT Chest/Abd/Pel 12/1 -moderate pancreatis, mild asicites. Small bilateral pleural effusions Abd x-ray 12/1 - NG tube tip and side port in proximal stomach. No bowel obstruction or free air CXR 12/1 - No pneumothorax, developing pneumonia left lower lobe. Mild bibasilar atelectasis. RUQ [place] 12/1 - GB sludge, negative acute cholecystitis. Fatty liver. Small ascites/partially visualized right pleural  effusion  TTE Echo 12/1 - Gr I diastolic dysfunction LE [place] 12/1 - No evidence DVT in either LE  CXR 01/29/20 - Lines and tubes in stable position. New infiltrate right lung base. Persistent bibasilar atelectasis.   LUE [place] 01/29/20 - No evidence of DVT  EEG 12/4>>severe diffuse encephalopathy  MRI Brain 12/5>>No acute findings   CT Abdomen 12/6 - acute pancreatitis with interval decrease in peripancreatic and upper abdominal edema since prior exam. No pancreatic hemorrhage, mass or pancreatitis-associated fluid collection.  KUB 12/09 Nonobstructive bowel gas pattern, enteric tube tip/side hole overlie the gastric body  CXR 12/10>> Improvement of right sided infiltrates/atelectasis. Left pleural effusion improving  CXR 12/14>> Mild bilateral infiltrates unchanged. No new findings. ET tube 8 cm above carina  EEG 12/16>> Moderate diffuse encephalopathy, nonspecific etiology. No seziures or epileptiform discharges  CXR 12/20 > Mild bibasilar opacities with small left pleural effusion   Micro Data:  11/30 Bld Cx - no growth  11/30 MRSA - negative 12/07 Trache Aspirate Cx - Abundant WBC, predom PMN. No organism on gram stain. Culture few candida albicans, no Staph aureus.  12/12 Bld Cx x2 - No growth 6 days 12/19 Bld Cx x2 - Aerobic bottle gram + cocci. Culture + Staph epi methicillin resistant 12/19 Respiratory culture - moderate WBC, predominantly PMN, few gram + cocci - moderate Staph aureus. Pan sensitive  Antimicrobials:  01/26/2020 cefepime  01/26/2020 - d/c'd 12/1 vancomycin 02/02/2020 - DC cefepime. Vancomycin/meropenem started for 7 day course (end date 12/13) 02/06/2020 Vancomycin discontinued due to red rash and linezolid started 02/08/2020 Linezolid, Meropenem discontinued, red  rash now with vesicles 02/14/2020 Linezolid,Cefepime restarted  Interim history/subjective:  Overnight, patient with max temp of 100.8. No other concerns overnight. Patient's BC resulted positive  for methicillin resistant Staph epi, tracheal aspirate + for Staph aureus.   Objective   Blood pressure 117/75, pulse (!) 105, temperature 99.4 F (37.4 C), temperature source Oral, resp. rate 17, height 5\' 10"  (1.778 m), weight 103.8 kg, SpO2 99 %.    Vent Mode: Spontaneous;Stand-by FiO2 (%):  [28 %-30 %] 28 % PEEP:  [5 cmH20] 5 cmH20   Intake/Output Summary (Last 24 hours) at 02/16/2020 0917 Last data filed at 02/16/2020 0700 Gross per 24 hour  Intake 2370.81 ml  Output 1740 ml  Net 630.81 ml   Filed Weights   02/14/20 0406 02/15/20 0349 02/16/20 0330  Weight: 108 kg 109.2 kg 103.8 kg   Examination: General: Ill-appearing. Trache in place, with supplemental oxygen HENT: 8.5 mm cuffed trach to vent. PERRL Lungs: Coarse breath sounds bilaterally. No wheezing, rhonchi appreciated Cardiovascular: Tachycardic. Regular rhythm. no murmurs, gallops, rubs Abdomen: Non-distended. BS+.  Extremities: SCD's in place.  Skin: Dry skin of bilateral cubital fossa.  Neuro: Patient tracking with eyes. Following commands. Responding to pain. GU: Condom cath in place.   Assessment & Plan:  Acute Hypoxemic Respiratory Failure Trache placement 12/20 via ENT. Patient continues to have increase in alertness. Overnight patient was continued on precedex .4 mg. Patient off of ventilator on supplemental oxygen via non-rebreather over his trache. Patient without agitation and resting comfortably with trache in place.  Plan: -DC precedex, continue clonidine .3 q6h.  -continue Seroquel 100 BID, QTC 12/16 was 411 -if stable and without needing sedatives, will plan to transfer out of ICU tomorrow.   Fever Leukocytosis +BC Staph epi - methicillin resistant Patient with overnight temperature max os 100.8. Abx stopped 12/13 due to suspected drug rash. Blood cultures in one vial positive for Staph epi methicillin resistant. Also with trache cultures positive for staph aureus. Currently on day 3 of abx with  linezolid/cefepime. Will repeat BC. X-ray with evidence for bibasilar opacities. Bedside 1/14 performed by Dr. [place] and myself, did not appreciate any right sided effusion, difficult to assess on the right due to patient's cardiac shadow.  Plan - tylenol q6h PRN for fevers - Day 3 of linezolid/cefepime - started 12/19 - BC x 2 + staph epi, repeat cultures ordered - tracheal aspirate few WBC, PMN's. + for Staph aureus  - UC negative leuks/nitrites - continue to trend WBC, repeat CBC pending   Acute Metabolic Encephalopathy Schizophrenia Patient followed by [facility]. Current outpatient regimen of abilify monthly. Mother states patient missed November dosage. Has had less agitation over the past 48 hours since extubation.  Plan: - continue seroquel 100 mg BID for now.   PEA Cardiac Arrest In setting of pancreatitis, DKA and severe metabolic derangements Shock resolved.  Plan: Monitor electrolytes daily Monitor telemetry  Acute Severe Pancreatitis  Unclear etiology, Tg elevated upon admission. IgG negative. Mother denies alcohol and drug use. Imaging negative for biliary stricture or cholestasis. CT a/p 12/06 no pancreatic hemorrhage, mass, or fluid collection seen. Abdomen with active bowel sounds. KUB performed 12/09, revealed nonobstructive bowel gas pattern. Plan: -appears resolved at this time -TG improved -miralax BID -continue supportive care -tube feeds continued  Drug Rash - unclear etiology, suspected to be secondary to vancomycin - improved at this time - abx restarted 12/19 for fever. Will monitor for rash  DKA with new diagnosis DM2 New onset in setting  of pancreatitis. Off of insulin drip. Tube feeds continued Plan: -titrate SSI for goal CBG 140-180. -hypoglycemia protocol in place -tube feeds resumed  Acute Kidney Injury - improving Hypernatremia - resolved Hyperphosphatemia - resolved CMP Pending at this time.   Plan: -no longer receiving free water  flushes -monitor renal function and electrolytes  Buttock Wound Nursing note flaking of skin around buttocks Plan: -wound care consulted, recs of gerhardt's butt cream to MASD/IAD after cleansing area.    Best practice:  Diet: TF Pain/Anxiety/Delirium protocol (if indicated): Seroquel VAP protocol (if indicated): In place DVT prophylaxis: SCD's / Lovenox. GI prophylaxis: PPI Glucose control: Standard glycemic control Mobility: Bedrest. Last date of multidisciplinary goals of care discussion: Goals of care discussion with mother 12/20 Family and staff present: Nurse and doctor Summary of discussion: Patient is full code. Continue aggressive care measures. Code Status: Full  Thalia Bloodgood DO  Internal Medicine Resident PGY-1 [name]  Pager: [phone]    Labs   CBC: Recent Labs  Lab 02/11/20 0103 02/11/20 0356 02/12/20 0235 02/12/20 0248 02/13/20 0317 02/14/20 0423 02/15/20 0127  WBC 13.9*  --   --  13.9* 11.3* 8.0 11.0*  NEUTROABS  --   --   --   --   --   --  7.4  HGB 9.1*   < > 8.5* 8.7* 8.0* 8.3* 8.2*  HCT 28.2*   < > 25.0* 27.6* 25.9* 26.5* 26.1*  MCV 95.9  --   --  96.8 97.4 95.3 95.6  PLT 273  --   --  253 229 247 238   < > = values in this interval not displayed.    Basic Metabolic Panel: Recent Labs  Lab 02/11/20 0103 02/11/20 0356 02/12/20 0235 02/12/20 0248 02/13/20 0317 02/14/20 0423 02/15/20 0127  NA 146*   < > 142 144 142 136 137  K 4.1   < > 4.2 4.1 3.8 3.9 4.0  CL 108  --   --  104 103 102 102  CO2 28  --   --  29 29 25 26   GLUCOSE 96  --   --  143* 69* 251* 82  BUN 43*  --   --  42* 36* 29* 24*  CREATININE 1.27*  --   --  1.41* 1.16 1.06 1.06  CALCIUM 9.6  --   --  9.4 9.3 8.9 9.1  MG 1.9  --   --  1.9  --   --   --   PHOS 3.3  --   --  4.2  --   --   --    < > = values in this interval not displayed.   GFR: Estimated Creatinine Clearance: 108.4 mL/min (by C-G formula based on SCr of 1.06 mg/dL). Recent Labs  Lab  02/12/20 0248 02/13/20 0317 02/14/20 0423 02/15/20 0127  WBC 13.9* 11.3* 8.0 11.0*   Liver Function Tests: Recent Labs  Lab 02/10/20 0319 02/11/20 0103 02/12/20 0248 02/13/20 0317 02/14/20 0423  AST 41 55* 46* 42* 37  ALT 46* 55* 58* 52* 52*  ALKPHOS 106 118 124 96 96  BILITOT 1.0 0.8 0.9 0.6 0.7  PROT 6.0* 6.0* 6.5 5.8* 5.9*  ALBUMIN 1.9* 2.1* 2.2* 1.9* 1.9*   No results for input(s): LIPASE, AMYLASE in the last 168 hours. No results for input(s): AMMONIA in the last 168 hours. ABG    Component Value Date/Time   PHART 7.463 (H) 02/12/2020 0235   PCO2ART 45.6 02/12/2020 0235  PO2ART 52 (L) 02/12/2020 0235   HCO3 32.7 (H) 02/12/2020 0235   TCO2 34 (H) 02/12/2020 0235   ACIDBASEDEF 5.0 (H) 01/26/2020 2111   O2SAT 88.0 02/12/2020 0235     Coagulation Profile: No results for input(s): INR, PROTIME in the last 168 hours.  Cardiac Enzymes: No results for input(s): CKTOTAL, CKMB, CKMBINDEX, TROPONINI in the last 168 hours.  HbA1C: Hgb A1c MFr Bld  Date/Time Value Ref Range Status  01/25/2020 05:40 PM 13.4 (H) 4.8 - 5.6 % Final    Comment:    (NOTE) Pre diabetes:          5.7%-6.4%  Diabetes:              >6.4%  Glycemic control for   <7.0% adults with diabetes     CBG: Recent Labs  Lab 02/15/20 1703 02/15/20 1908 02/15/20 2304 02/16/20 0303 02/16/20 0705  GLUCAP 110* 108* 213* 96 89

## 2020-02-17 ENCOUNTER — Encounter (HOSPITAL_COMMUNITY): Payer: Self-pay | Admitting: Otolaryngology

## 2020-02-17 DIAGNOSIS — R6521 Severe sepsis with septic shock: Secondary | ICD-10-CM

## 2020-02-17 DIAGNOSIS — A419 Sepsis, unspecified organism: Principal | ICD-10-CM

## 2020-02-17 LAB — COMPREHENSIVE METABOLIC PANEL
ALT: 89 U/L — ABNORMAL HIGH (ref 0–44)
AST: 58 U/L — ABNORMAL HIGH (ref 15–41)
Albumin: 2.1 g/dL — ABNORMAL LOW (ref 3.5–5.0)
Alkaline Phosphatase: 132 U/L — ABNORMAL HIGH (ref 38–126)
Anion gap: 9 (ref 5–15)
BUN: 16 mg/dL (ref 6–20)
CO2: 27 mmol/L (ref 22–32)
Calcium: 9.5 mg/dL (ref 8.9–10.3)
Chloride: 102 mmol/L (ref 98–111)
Creatinine, Ser: 0.84 mg/dL (ref 0.61–1.24)
GFR, Estimated: >60 mL/min (ref >60)
Glucose, Bld: 147 mg/dL — ABNORMAL HIGH (ref 70–99)
Potassium: 4.1 mmol/L (ref 3.5–5.1)
Sodium: 138 mmol/L (ref 135–145)
Total Bilirubin: 0.6 mg/dL (ref 0.3–1.2)
Total Protein: 6.3 g/dL — ABNORMAL LOW (ref 6.5–8.1)

## 2020-02-17 LAB — CBC WITH DIFFERENTIAL/PLATELET
Abs Immature Granulocytes: 0.06 10*3/uL (ref 0.00–0.07)
Basophils Absolute: 0 10*3/uL (ref 0.0–0.1)
Basophils Relative: 0 %
Eosinophils Absolute: 0.3 10*3/uL (ref 0.0–0.5)
Eosinophils Relative: 3 %
HCT: 28 % — ABNORMAL LOW (ref 39.0–52.0)
Hemoglobin: 9.2 g/dL — ABNORMAL LOW (ref 13.0–17.0)
Immature Granulocytes: 1 %
Lymphocytes Relative: 13 %
Lymphs Abs: 1.4 10*3/uL (ref 0.7–4.0)
MCH: 31 pg (ref 26.0–34.0)
MCHC: 32.9 g/dL (ref 30.0–36.0)
MCV: 94.3 fL (ref 80.0–100.0)
Monocytes Absolute: 1 10*3/uL (ref 0.1–1.0)
Monocytes Relative: 9 %
Neutro Abs: 8.2 10*3/uL — ABNORMAL HIGH (ref 1.7–7.7)
Neutrophils Relative %: 74 %
Platelets: 306 10*3/uL (ref 150–400)
RBC: 2.97 MIL/uL — ABNORMAL LOW (ref 4.22–5.81)
RDW: 14.2 % (ref 11.5–15.5)
WBC: 10.9 10*3/uL — ABNORMAL HIGH (ref 4.0–10.5)
nRBC: 0 % (ref 0.0–0.2)

## 2020-02-17 LAB — PHOSPHORUS: Phosphorus: 3.3 mg/dL (ref 2.5–4.6)

## 2020-02-17 LAB — GLUCOSE, CAPILLARY
Glucose-Capillary: 130 mg/dL — ABNORMAL HIGH (ref 70–99)
Glucose-Capillary: 108 mg/dL — ABNORMAL HIGH (ref 70–99)
Glucose-Capillary: 216 mg/dL — ABNORMAL HIGH (ref 70–99)
Glucose-Capillary: 160 mg/dL — ABNORMAL HIGH (ref 70–99)
Glucose-Capillary: 128 mg/dL — ABNORMAL HIGH (ref 70–99)
Glucose-Capillary: 124 mg/dL — ABNORMAL HIGH (ref 70–99)

## 2020-02-17 LAB — MAGNESIUM: Magnesium: 1.7 mg/dL (ref 1.7–2.4)

## 2020-02-17 MED ORDER — METOPROLOL TARTRATE 25 MG/10 ML ORAL SUSPENSION
50.0000 mg | Freq: Two times a day (BID) | ORAL | Status: DC
  Administered 2020-02-17 – 2020-03-02 (×30): 50 mg
  Filled 2020-02-17 (×31): qty 20

## 2020-02-17 MED ORDER — PROSOURCE TF PO LIQD
45.0000 mL | Freq: Every day | ORAL | Status: DC
  Administered 2020-02-17 – 2020-03-02 (×66): 45 mL
  Filled 2020-02-17 (×68): qty 45

## 2020-02-17 MED ORDER — JUVEN PO PACK
1.0000 | PACK | Freq: Two times a day (BID) | ORAL | Status: DC
  Administered 2020-02-17 – 2020-03-01 (×27): 1
  Filled 2020-02-17 (×26): qty 1

## 2020-02-17 MED ORDER — OSMOLITE 1.5 CAL PO LIQD
1000.0000 mL | ORAL | Status: DC
  Administered 2020-02-17 – 2020-02-29 (×12): 1000 mL
  Filled 2020-02-17 (×10): qty 1000

## 2020-02-17 NOTE — Progress Notes (Signed)
Subjective: On vent. Not responsive.  Objective: Vital signs in last 24 hours: Temp:  [98.4 F (36.9 C)-100.5 F (38.1 C)] 99 F (37.2 C) (12/22 0722) Pulse Rate:  [99-151] 99 (12/22 0737) Resp:  [15-26] 18 (12/22 0737) BP: (113-145)/(75-110) 122/80 (12/22 0737) SpO2:  [93 %-100 %] 97 % (12/22 0737) FiO2 (%):  [28 %] 28 % (12/22 0737) Weight:  [105.8 kg] 105.8 kg (12/22 0416)  Physical Exam: General appearance:On vent,unresponsive Head:Normocephalic, without obvious abnormality, atraumatic Ears: Examination of the ears shows normal auricles and external auditory canals bilaterally. Nose: Nasal examination shows normal mucosa, septum, turbinates.  Face: Facial examination shows no asymmetry.  Mouth:Normal mucosa. No injury. Neck:Trach tube in place. No bleeding. Neuro:Sedatedand unresponsive.   Recent Labs    02/15/20 0127 02/16/20 0320  WBC 11.0* 11.1*  HGB 8.2* 8.0*  HCT 26.1* 25.5*  PLT 238 246   Recent Labs    02/15/20 0127 02/16/20 0320  NA 137 134*  K 4.0 3.8  CL 102 101  CO2 26 23  GLUCOSE 82 100*  BUN 24* 21*  CREATININE 1.06 1.05  CALCIUM 9.1 8.9    Medications:  I have reviewed the patient's current medications. Scheduled: . chlorhexidine gluconate (MEDLINE KIT)  15 mL Mouth Rinse BID  . Chlorhexidine Gluconate Cloth  6 each Topical Daily  . cloNIDine  0.3 mg Per Tube Q6H  . docusate  100 mg Per Tube BID  . enoxaparin (LOVENOX) injection  40 mg Subcutaneous Q24H  . feeding supplement (PROSource TF)  45 mL Per Tube QID  . Gerhardt's butt cream   Topical BID  . guaiFENesin  5 mL Per Tube Q4H  . insulin aspart  0-15 Units Subcutaneous Q4H  . insulin aspart  8 Units Subcutaneous Q4H  . insulin detemir  40 Units Subcutaneous BID  . mouth rinse  15 mL Mouth Rinse 10 times per day  . metoprolol tartrate  50 mg Per Tube BID  . pantoprazole sodium  40 mg Per Tube Daily  . QUEtiapine  100 mg Per Tube q morning - 10a   And  . QUEtiapine  100  mg Per Tube QHS   Continuous: . sodium chloride 250 mL (02/08/20 1825)  . cefTRIAXone (ROCEPHIN)  IV Stopped (02/16/20 1325)  . feeding supplement (VITAL 1.5 CAL) 1,000 mL (02/15/20 1732)    Assessment/Plan: POD #4 s/p trach. On vent now. Tolerated trach collar yesterday. - No trach issues overnight. - Routine fresh trach care. - Will change trach early next week. - Wean vent as tolerated.   LOS: 23 days   Chamberlain Steinborn W Orren Pietsch 02/17/2020, 9:24 AM

## 2020-02-17 NOTE — Progress Notes (Signed)
PROGRESS NOTE  Bryan [name] OYD:[id number] DOB: 1975-09-17 DOA: 01/25/2020 PCP: Default, Provider, MD  HPI/Recap of past 24 hours: Patient is a 44 year old male with past medical history of diabetes mellitus as well as history of schizophrenia admitted on 11/29 for encephalopathy secondary to DKA.  At that time, there was also concerns for infection causing septic shock.  Patient was admitted and placed in the ICU.  Started on IV fluids, antibiotics and insulin drip.  CT of abdomen and pelvis found acute pancreatitis.  Etiology is unclear although triglycerides were at 600.  No signs of any gallbladder disease and patient does not drink as per his mother.  Echocardiogram done 12/1 noted grade 1 diastolic dysfunction.  By 12/2, patient suffered PEA cardiac arrest and underwent 2 rounds of CPR was able to be resuscitated.  He was placed on the ventilator.  Neurology consulted for encephalopathy felt to be secondary to anoxia.  MRI without acute findings and EEG noted diffuse encephalopathy.  Was placed on Precedex.  Patient developed fever and elevated white blood cell count and was started on empiric antibiotics on 12/7.  Developed a rash and antibiotics were stopped.  By 12/17, continue to remain on the ventilator.  Seen by ENT and underwent tracheostomy on 12/18.  Spiked fever on 12/19 as well as had some tachycardia with source felt to be multifocal pneumonia.  Started on cefepime and Zyvox.  Improved.  Started on beta-blocker on 5/22 for tachycardia.  Has been tolerating trach collar.  Precedex weaned off.  Grew up blood culture staph epi felt to be likely contaminant.  Patient now does open eyes, follows a few commands.  Not really able to get out of bed.  Assessment/Plan: Active Problems:   Schizoaffective disorder ([place]): On Seroquel.   MDD (major depressive disorder), recurrent, severe, with psychosis ([place])  Septic shock present on admission patient met criteria for septic shock secondary to  initial pneumonia and hypotension requiring pressor support.  Degree of hypotension out of proportion from DKA alone.  Initial septic shock stabilized and patient off pressure support although now multifocal pneumonia returned and patient on Zyvox and cefepime.  Diabetes mellitus with DKA (diabetic ketoacidosis) ([place]): Initially.  Pancreatitis is possible triggering event for DKA.  Treated with IV fluids and insulin drip.  Resolved.    Pancreatitis: Unclear etiology of what caused this.  Triglycerides although elevated not markedly elevated.  At 600.  Appears to be stable now.  Acute respiratory failure ([place]): Unable to fully wean off of vent requiring tracheostomy.  Tolerating trach collar    AKI (acute kidney injury) ([place]): Secondary to DKA and shock.  Resolved.   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    Pressure injury of skin: Found to have decubitus ulcer on coccyx.  Wound care following.   Code Status: Full code  Family Communication: Left message for mom  Disposition Plan: Likely will need LTAC   Consultants:  Critical care  Neurology  Procedures:  Tracheostomy done 12/18  Echocardiogram: Grade 1 diastolic dysfunction  Ventilator support: 12/1-12/20  Lower extremity ultrasound: No evidence of DVT  Antimicrobials:  Currently on cefepime and Zyvox  DVT prophylaxis: On SCDs and Lovenox   Objective: Vitals:   02/17/20 1105 02/17/20 1210  BP:    Pulse:  96  Resp:  18  Temp: (!) 100.6 F (38.1 C)   SpO2:  97%    Intake/Output  Summary (Last 24 hours) at 02/17/2020 1357 Last data filed at 02/17/2020 0900 Gross per 24 hour  Intake 940 ml  Output 2701 ml  Net -1761 ml   Filed Weights   02/15/20 0349 02/16/20 0330 02/17/20 0416  Weight: 109.2 kg 103.8 kg 105.8 kg   Body mass index is 33.47 kg/m.  Exam:   General: Times a  week, does not really follow too many commands.  HEENT normocephalic, status post tracheostomy.  Mucous membranes are dry  Cardiovascular: Regular rhythm, borderline tachycardia  Respiratory: Decreased breath sounds, few Rales  Abdomen: Soft, nontender, hypoactive bowel sounds  Musculoskeletal: No clubbing or cyanosis, 1+ pitting edema  Skin: Unstageable decubitus ulcer  Psychiatry: Underlying history of schizophrenia other currently, encephalopathy predominant  Neurology: Unreliable exam due to encephalopathy   Data Reviewed: CBC: Recent Labs  Lab 02/13/20 0317 02/14/20 0423 02/15/20 0127 02/16/20 0320 02/17/20 0827  WBC 11.3* 8.0 11.0* 11.1* 10.9*  NEUTROABS  --   --  7.4 8.0* 8.2*  HGB 8.0* 8.3* 8.2* 8.0* 9.2*  HCT 25.9* 26.5* 26.1* 25.5* 28.0*  MCV 97.4 95.3 95.6 95.9 94.3  PLT 229 247 238 246 161   Basic Metabolic Panel: Recent Labs  Lab 02/11/20 0103 02/11/20 0356 02/12/20 0248 02/13/20 0317 02/14/20 0423 02/15/20 0127 02/16/20 0320 02/17/20 0827  NA 146*   < > 144 142 136 137 134* 138  K 4.1   < > 4.1 3.8 3.9 4.0 3.8 4.1  CL 108  --  104 103 102 102 101 102  CO2 28  --  _0 GLUCOSE 96  --  143* 69* 251* 82 100* 147*  BUN 43*  --  42* 36* 29* 24* 21* 16  CREATININE 1.27*  --  1.41* 1.16 1.06 1.06 1.05 0.84  CALCIUM 9.6  --  9.4 9.3 8.9 9.1 8.9 9.5  MG 1.9  --  1.9  --   --   --   --  1.7  PHOS 3.3  --  4.2  --   --   --   --  3.3   < > = values in this interval not displayed.   GFR: Estimated Creatinine Clearance: 138.1 mL/min (by C-G formula based on SCr of 0.84 mg/dL). Liver Function Tests: Recent Labs  Lab 02/12/20 0248 02/13/20 0317 02/14/20 0423 02/16/20 0320 02/17/20 0827  AST 46* 42* 37 55* 58*  ALT 58* 52* 52* 66* 89*  ALKPHOS 124 96 96 119 132*  BILITOT 0.9 0.6 0.7 0.3 0.6  PROT 6.5 5.8* 5.9* 5.7* 6.3*  ALBUMIN 2.2* 1.9* 1.9* 2.0* 2.1*   No results for input(s): LIPASE, AMYLASE in the last 168 hours. No results  for input(s): AMMONIA in the last 168 hours. Coagulation Profile: No results for input(s): INR, PROTIME in the last 168 hours. Cardiac Enzymes: No results for input(s): CKTOTAL, CKMB, CKMBINDEX, TROPONINI in the last 168 hours. BNP (last 3 results) No results for input(s): PROBNP in the last 8760 hours. HbA1C: No results for input(s): HGBA1C in the last 72 hours. CBG: Recent Labs  Lab 02/16/20 1921 02/16/20 2311 02/17/20 0328 02/17/20 0719 02/17/20 1104  GLUCAP 134* 131* 130* 108* 216*   Lipid Profile: Recent Labs    02/16/20 0320  TRIG 188*   Thyroid Function Tests: No results for input(s): TSH, T4TOTAL, FREET4, T3FREE, THYROIDAB in the last 72 hours. Anemia Panel: No results for input(s): VITAMINB12, FOLATE, FERRITIN, TIBC, IRON, RETICCTPCT in the last 72 hours. Urine  analysis:    Component Value Date/Time   COLORURINE YELLOW 02/14/2020 2040   APPEARANCEUR CLEAR 02/14/2020 2040   LABSPEC 1.016 02/14/2020 2040   PHURINE 6.0 02/14/2020 2040   GLUCOSEU 50 (A) 02/14/2020 2040   HGBUR NEGATIVE 02/14/2020 2040   BILIRUBINUR NEGATIVE 02/14/2020 2040   KETONESUR NEGATIVE 02/14/2020 2040   PROTEINUR NEGATIVE 02/14/2020 2040   UROBILINOGEN 2.0 (H) 01/11/2013 1615   NITRITE NEGATIVE 02/14/2020 2040   LEUKOCYTESUR NEGATIVE 02/14/2020 2040   Sepsis Labs: _0 (procalcitonin:4,lacticidven:4)  ) Recent Results (from the past 240 hour(s))  Culture, respiratory (non-expectorated)     Status: None   Collection Time: 02/14/20  8:40 PM   Specimen: Tracheal Aspirate; Respiratory  Result Value Ref Range Status   Specimen Description TRACHEAL ASPIRATE  Final   Special Requests NONE  Final   Gram Stain   Final    MODERATE WBC PRESENT, PREDOMINANTLY PMN FEW GRAM POSITIVE COCCI Performed at [facility], [place] [address] [place], [place] [address]    Culture MODERATE STAPHYLOCOCCUS AUREUS  Final   Report Status 02/16/2020 FINAL  Final   Organism ID, Bacteria  STAPHYLOCOCCUS AUREUS  Final      Susceptibility   Staphylococcus aureus - MIC*    CIPROFLOXACIN <=0.5 SENSITIVE Sensitive     ERYTHROMYCIN <=0.25 SENSITIVE Sensitive     GENTAMICIN <=0.5 SENSITIVE Sensitive     OXACILLIN <=0.25 SENSITIVE Sensitive     TETRACYCLINE <=1 SENSITIVE Sensitive     VANCOMYCIN 1 SENSITIVE Sensitive     TRIMETH/SULFA <=10 SENSITIVE Sensitive     CLINDAMYCIN <=0.25 SENSITIVE Sensitive     RIFAMPIN <=0.5 SENSITIVE Sensitive     Inducible Clindamycin NEGATIVE Sensitive     * MODERATE STAPHYLOCOCCUS AUREUS  Culture, blood (Routine X 2) w Reflex to ID Panel     Status: None (Preliminary result)   Collection Time: 02/14/20  8:56 PM   Specimen: BLOOD RIGHT HAND  Result Value Ref Range Status   Specimen Description BLOOD RIGHT HAND  Final   Special Requests   Final    BOTTLES DRAWN AEROBIC AND ANAEROBIC Blood Culture adequate volume   Culture   Final    NO GROWTH 3 DAYS Performed at [facility], [address], [place], [place] [address]    Report Status PENDING  Incomplete  Culture, blood (Routine X 2) w Reflex to ID Panel     Status: Abnormal (Preliminary result)   Collection Time: 02/14/20  9:04 PM   Specimen: BLOOD  Result Value Ref Range Status   Specimen Description BLOOD  Final   Special Requests   Final    RIGHT PINKY BOTTLES DRAWN AEROBIC AND ANAEROBIC Blood Culture adequate volume   Culture  Setup Time   Final    AEROBIC BOTTLE ONLY GRAM POSITIVE COCCI Organism ID to follow CRITICAL RESULT CALLED TO, READ BACK BY AND VERIFIED WITH: G ABBOTT PHARMD 02/16/20 0422 JDW    Culture (A)  Final    STAPHYLOCOCCUS EPIDERMIDIS THE SIGNIFICANCE OF ISOLATING THIS ORGANISM FROM A SINGLE SET OF BLOOD CULTURES WHEN MULTIPLE SETS ARE DRAWN IS UNCERTAIN. PLEASE NOTIFY THE MICROBIOLOGY DEPARTMENT WITHIN ONE WEEK IF SPECIATION AND SENSITIVITIES ARE REQUIRED. Performed at [facility], [place] [address] [place], [place] [address]    Report Status PENDING   Incomplete  Blood Culture ID Panel (Reflexed)     Status: Abnormal   Collection Time: 02/14/20  9:04 PM  Result Value Ref Range Status   Enterococcus faecalis  NOT DETECTED NOT DETECTED Final   Enterococcus Faecium NOT DETECTED NOT DETECTED Final   Listeria monocytogenes NOT DETECTED NOT DETECTED Final   Staphylococcus species DETECTED (A) NOT DETECTED Final    Comment: CRITICAL RESULT CALLED TO, READ BACK BY AND VERIFIED WITH: G ABBOTT PHARMD 02/16/20 0422 JDW    Staphylococcus aureus (BCID) NOT DETECTED NOT DETECTED Final   Staphylococcus epidermidis DETECTED (A) NOT DETECTED Final    Comment: Methicillin (oxacillin) resistant coagulase negative staphylococcus. Possible blood culture contaminant (unless isolated from more than one blood culture draw or clinical case suggests pathogenicity). No antibiotic treatment is indicated for blood  culture contaminants. CRITICAL RESULT CALLED TO, READ BACK BY AND VERIFIED WITH: G ABBOTT PHARMD 02/16/20 0422 JDW    Staphylococcus lugdunensis NOT DETECTED NOT DETECTED Final   Streptococcus species NOT DETECTED NOT DETECTED Final   Streptococcus agalactiae NOT DETECTED NOT DETECTED Final   Streptococcus pneumoniae NOT DETECTED NOT DETECTED Final   Streptococcus pyogenes NOT DETECTED NOT DETECTED Final   A.calcoaceticus-baumannii NOT DETECTED NOT DETECTED Final   Bacteroides fragilis NOT DETECTED NOT DETECTED Final   Enterobacterales NOT DETECTED NOT DETECTED Final   Enterobacter cloacae complex NOT DETECTED NOT DETECTED Final   Escherichia coli NOT DETECTED NOT DETECTED Final   Klebsiella aerogenes NOT DETECTED NOT DETECTED Final   Klebsiella oxytoca NOT DETECTED NOT DETECTED Final   Klebsiella pneumoniae NOT DETECTED NOT DETECTED Final   Proteus species NOT DETECTED NOT DETECTED Final   Salmonella species NOT DETECTED NOT DETECTED Final   Serratia marcescens NOT DETECTED NOT DETECTED Final   Haemophilus influenzae NOT DETECTED NOT DETECTED  Final   Neisseria meningitidis NOT DETECTED NOT DETECTED Final   Pseudomonas aeruginosa NOT DETECTED NOT DETECTED Final   Stenotrophomonas maltophilia NOT DETECTED NOT DETECTED Final   Candida albicans NOT DETECTED NOT DETECTED Final   Candida auris NOT DETECTED NOT DETECTED Final   Candida glabrata NOT DETECTED NOT DETECTED Final   Candida krusei NOT DETECTED NOT DETECTED Final   Candida parapsilosis NOT DETECTED NOT DETECTED Final   Candida tropicalis NOT DETECTED NOT DETECTED Final   Cryptococcus neoformans/gattii NOT DETECTED NOT DETECTED Final   Methicillin resistance mecA/C DETECTED (A) NOT DETECTED Final    Comment: CRITICAL RESULT CALLED TO, READ BACK BY AND VERIFIED WITH: Denton Brick [facility] 02/16/20 0422 JDW Performed at [facility], [address] [place], [place] [address]       Studies: No results found.  Scheduled Meds: . chlorhexidine gluconate (MEDLINE KIT)  15 mL Mouth Rinse BID  . Chlorhexidine Gluconate Cloth  6 each Topical Daily  . cloNIDine  0.3 mg Per Tube Q6H  . docusate  100 mg Per Tube BID  . enoxaparin (LOVENOX) injection  40 mg Subcutaneous Q24H  . feeding supplement (PROSource TF)  45 mL Per Tube 5 X Daily  . Gerhardt's butt cream   Topical BID  . guaiFENesin  5 mL Per Tube Q4H  . insulin aspart  0-15 Units Subcutaneous Q4H  . insulin aspart  8 Units Subcutaneous Q4H  . insulin detemir  40 Units Subcutaneous BID  . mouth rinse  15 mL Mouth Rinse 10 times per day  . metoprolol tartrate  50 mg Per Tube BID  . nutrition supplement (JUVEN)  1 packet Per Tube BID BM  . pantoprazole sodium  40 mg Per Tube Daily  . QUEtiapine  100 mg Per Tube q morning - 10a   And  . QUEtiapine  100 mg  Per Tube QHS    Continuous Infusions: . sodium chloride 250 mL (02/08/20 1825)  . cefTRIAXone (ROCEPHIN)  IV 2 g (02/17/20 1223)  . feeding supplement (OSMOLITE 1.5 CAL) 1,000 mL (02/17/20 1003)     LOS: 23 days     Annita Brod, MD [facility]   02/17/2020, 1:57 PM

## 2020-02-17 NOTE — Progress Notes (Addendum)
Nutrition Follow-up  DOCUMENTATION CODES:   Not applicable  INTERVENTION:   TF via Cortrak tube: Change to Osmolite 1.5 at 60 ml/h (1440 ml per day) Prosource TF 45 ml 5 times daily Provides 2360 kcal, 145 gm protein, 1094 ml free water daily.  Add Juven BID, each packet provides 80 calories, 8 grams of carbohydrate, 2.5  grams of protein (collagen), 7 grams of L-arginine and 7 grams of L-glutamine; supplement contains CaHMB, Vitamins C, E, B12 and Zinc to promote wound healing.  For maintenance fluid needs, recommend adding 200 ml free water flushes every 4 hours (when sodium level normalizes) for total free water intake 2294 ml daily.  NUTRITION DIAGNOSIS:   Increased nutrient needs related to acute illness,wound healing as evidenced by estimated needs.  Ongoing   GOAL:   Patient will meet greater than or equal to 90% of their needs  Met with TF  MONITOR:   TF tolerance,Labs,Skin  REASON FOR ASSESSMENT:   Consult Enteral/tube feeding initiation and management (trickle tube feeds)  ASSESSMENT:   44 yo male admitted with AMS, new onset DM with DKA. PMH includes MDD, paranoid schizophrenia, HTN, smoker, drug use (cocaine, marijuana), heavy alcohol use.  S/P Cortrak placement 12/17, tip in the stomach. S/P tracheostomy 12/18. On trach collar since 12/20.  BC positive for methicillin resistant Staph epi. Trach aspirate positive for Staph aureus.  Receiving Vital 1.5 via Cortrak at 60 ml/h with Prosource TF 45 ml QID. Tolerating well.  Standard TF formula should be tolerated at this point, will change TF orders.   Labs reviewed (12/21). Sodium 134, triglycerides 188 CBG: 130-108 this morning  Medications reviewed and include colace, novolog, levemir.  Weight 105.8 kg today  Lowest weight since admission 102.2 kg on 12/15 I/O +9.2 L since admission UOP 2701 ml x 24 h  Diet Order:   Diet Order            Diet NPO time specified  Diet effective midnight                  EDUCATION NEEDS:   Not appropriate for education at this time  Skin:  Skin Assessment: Skin Integrity Issues: Skin Integrity Issues:: Stage II,Other (Comment) Stage II: coccyx (12/17) Other: MASD and skin tear to buttocks  Last BM:  12/21 type 7, rectal tube  Height:   Ht Readings from Last 1 Encounters:  01/25/20 '5\' 10"'  (1.778 m)    Weight:   Wt Readings from Last 1 Encounters:  02/17/20 105.8 kg    Ideal Body Weight:  75.5 kg  BMI:  Body mass index is 33.47 kg/m.  Estimated Nutritional Needs:   Kcal:  2200-2400  Protein:  130-150 gm  Fluid:  2.5 L    Lucas Mallow, RD, LDN, CNSC Please refer to Amion for contact information.

## 2020-02-18 DIAGNOSIS — A4101 Sepsis due to Methicillin susceptible Staphylococcus aureus: Secondary | ICD-10-CM

## 2020-02-18 DIAGNOSIS — K853 Drug induced acute pancreatitis without necrosis or infection: Secondary | ICD-10-CM

## 2020-02-18 LAB — CULTURE, BLOOD (ROUTINE X 2)

## 2020-02-18 LAB — GLUCOSE, CAPILLARY
Glucose-Capillary: 122 mg/dL — ABNORMAL HIGH (ref 70–99)
Glucose-Capillary: 148 mg/dL — ABNORMAL HIGH (ref 70–99)
Glucose-Capillary: 169 mg/dL — ABNORMAL HIGH (ref 70–99)
Glucose-Capillary: 118 mg/dL — ABNORMAL HIGH (ref 70–99)
Glucose-Capillary: 158 mg/dL — ABNORMAL HIGH (ref 70–99)
Glucose-Capillary: 159 mg/dL — ABNORMAL HIGH (ref 70–99)

## 2020-02-18 MED ORDER — CLONIDINE HCL 0.3 MG PO TABS
0.3000 mg | ORAL_TABLET | Freq: Four times a day (QID) | ORAL | Status: AC
  Administered 2020-02-18 – 2020-02-19 (×3): 0.3 mg
  Filled 2020-02-18 (×3): qty 1

## 2020-02-18 MED ORDER — CLONIDINE HCL 0.2 MG PO TABS: 0.1000 mg | ORAL_TABLET | Freq: Four times a day (QID) | ORAL | Status: DC

## 2020-02-18 MED ORDER — CLONIDINE HCL 0.2 MG PO TABS: 0.1000 mg | ORAL_TABLET | Freq: Three times a day (TID) | ORAL | Status: DC

## 2020-02-18 MED ORDER — CLONIDINE HCL 0.2 MG PO TABS: 0.1000 mg | ORAL_TABLET | Freq: Two times a day (BID) | ORAL | Status: DC

## 2020-02-18 MED ORDER — CLONIDINE HCL 0.2 MG PO TABS: 0.1000 mg | ORAL_TABLET | Freq: Every day | ORAL | Status: DC

## 2020-02-18 MED ORDER — CLONIDINE HCL 0.2 MG PO TABS
0.2000 mg | ORAL_TABLET | Freq: Four times a day (QID) | ORAL | Status: DC
  Administered 2020-02-19: 06:00:00 0.2 mg via ORAL
  Filled 2020-02-18: qty 1

## 2020-02-18 MED ORDER — CLONIDINE HCL 0.2 MG PO TABS: 0.2000 mg | ORAL_TABLET | Freq: Four times a day (QID) | ORAL | Status: DC

## 2020-02-18 NOTE — Progress Notes (Addendum)
HD#24 Subjective:   No acute events overnight. Patient afebrile overnight.  During evaluation at bedside during rounds, patient spontaneously awake, follows commands to stick out tongue, squeeze hands, however does not respond to questions.  Objective:   Vital signs in last 24 hours: Vitals:   02/18/20 0344 02/18/20 0400 02/18/20 0500 02/18/20 0600  BP:  106/78 (!) 180/107 127/79  Pulse:  98 (!) 130 (!) 118  Resp:  19 (!) 21 18  Temp:      TempSrc:      SpO2: 95% 99% 97% 92%  Weight:  101.5 kg    Height:       Supplemental O2: Tracheostomy collar SpO2: 92 % O2 Flow Rate (L/min): 5 L/min FiO2 (%): 28 %  Physical Exam Constitutional: chronically ill-appearing man, sitting upright in bed, in no acute distress HENT: normocephalic atraumatic, mucous membranes moist, diaphoretic Eyes: spontaneously open, roving Cardiovascular: regular rate and rhythm, no m/r/g Pulmonary/Chest: on the trach collar, coarse breath sounds, no wheezes or rhonchi, spontaneous cough Abdominal: soft, non-tender, non-distended, bowel sounds present Neurological: spontaneously awake, does not vocalize, moves all extremities spontaneously, follows commands to stick out tongue and squeeze hands; LUE contracted though wrist limp; spontaneous cough Skin: skin sloughing over bilateral hands  Pertinent Labs: CBC Latest Ref Rng & Units 02/17/2020 02/16/2020 02/15/2020  WBC 4.0 - 10.5 K/uL 10.9(H) 11.1(H) 11.0(H)  Hemoglobin 13.0 - 17.0 g/dL 0.0(F) 7.4(B) 4.4(H)  Hematocrit 39.0 - 52.0 % 28.0(L) 25.5(L) 26.1(L)  Platelets 150 - 400 K/uL 306 246 238    CMP Latest Ref Rng & Units 02/17/2020 02/16/2020 02/15/2020  Glucose 70 - 99 mg/dL 675(F) 163(W) 82  BUN 6 - 20 mg/dL 16 46(K) 59(D)  Creatinine 0.61 - 1.24 mg/dL 3.57 0.17 7.93  Sodium 135 - 145 mmol/L 138 134(L) 137  Potassium 3.5 - 5.1 mmol/L 4.1 3.8 4.0  Chloride 98 - 111 mmol/L 102 101 102  CO2 22 - 32 mmol/L 27 23 26   Calcium 8.9 - 10.3 mg/dL 9.5  8.9 9.1  Total Protein 6.5 - 8.1 g/dL 6.3(L) 5.7(L) -  Total Bilirubin 0.3 - 1.2 mg/dL 0.6 0.3 -  Alkaline Phos 38 - 126 U/L 132(H) 119 -  AST 15 - 41 U/L 58(H) 55(H) -  ALT 0 - 44 U/L 89(H) 66(H) -    Assessment/Plan:   Active Problems:   Schizoaffective disorder (H[facility])   MDD (major depressive disorder), recurrent, severe, with psychosis ([facility])   DKA (diabetic ketoacidosis) (H[facility])   Pancreatitis   Respiratory failure (HCC)   AKI (acute kidney injury) ([facility])   Dehydration   Toxic metabolic encephalopathy   Pressure injury of skin  Patient Summary: Bryan [name] is a 44 y.o. man with history of schizophrenia who presented to [facility] ED on 01/25/20 with acute encephalopathy and lethargy and was admitted for DKA. He was transferred to the ICU on 11/30 with agitation and hypotension, had subsequent PEA arrest. Found to have acute pancreatitis with unclear etiology though found to have elevated triglycerides. Encephalopathy felt to be secondary to anoxia. Started on empiric antibiotics vancomycin/meropenem on 12/7 due to concern for infectious etiology after developing fever and leukocytosis, however vancomycin discontinued after presumed drug reaction. Started on Precedex. Underwent tracheostomy by ENT on 12/18 due to ventilator dependence. Subsequently spiked another fever on 12/19 with CXR findings suspicious for multifocal pneumonia for which he was started on cefepime and Zyvox with some improvement. Weaned off of Precedex and tolerating trach collar support. Abx switched to IV  ceftriaxone for MSSA pneumonia. Care transferred initially to [facility] on 12/22 and then IMT[facility] on 12/23.   This is hospital day 24.  Acute hypoxemic respiratory failure due to multifocal pneumonia S/p trach POD #5 Sepsis due to recurrent multifocal pneumonia, now with MSSA Staph epi bacteremia vs likely contamination Afebrile overnight. Patient spontaneously awake and alert, follows some commands, does not  vocalize. Tolerating trach collar and maintaining O2 sats on 5L supplemental oxygen over his trach. Coarse breath sounds. - Continue ceftriaxone 2 g daily for MSSA pneumonia, today is day 3 - Start clonidine wean, appreciate pharmacy's assistance with this - 0.2 mg q6h starting 12/24 for 1 day - 0.1 mg q6h starting 12/25 for 1 day - 0.1 mg TID starting 12/26 for 1 day - 0.1 mg BID starting 12/27 for 1 day - 0.1 mg once on 12/28 - Trach management per PCCM - guaifenesin 100 mg q4h   Acute metabolic encephalopathy S/p PEA cardiac arrest Schizophrenia Minimal agitation over the last 72 hours. - Continue Seroquel 100 mg twice daily - NPO, tube feeds as below - Cardiac telemetry  Tachycardia Metoprolol 50 mg twice daily started 12/22. - Continue metoprolol 50 mg twice daily  Acute pancreatitis likely 2/2 hypertriglyceridemia, improved Triglycerides on 12/21 were 188, down from 400s on admission. - continue supportive care  New diagnosis of diabetes presenting with DKA Blood glucose well-controlled, ranging 90s-120s. - Insulin detemir (Levemir) 40 units twice daily - Novolog 8 units q4h - Hypoglycemia protocol in place - Tube feeds  AKI, improving: sCr on 12/22 0.84.  Decubitus ulcer on coccyx, unstageable Nursing note flaking of skin around buttocks. - Wound care consulted, appreciate their recs   Diet: Tube Feeds, NPO IVF: None VTE: Enoxaparin Code: Full   Please contact the on call pager after 5 pm and on weekends at [phone].  Alphonzo Severance, MD PGY-1 Internal Medicine Teaching Service Pager: [phone] 02/18/2020

## 2020-02-19 LAB — GLUCOSE, CAPILLARY
Glucose-Capillary: 108 mg/dL — ABNORMAL HIGH (ref 70–99)
Glucose-Capillary: 124 mg/dL — ABNORMAL HIGH (ref 70–99)
Glucose-Capillary: 130 mg/dL — ABNORMAL HIGH (ref 70–99)
Glucose-Capillary: 130 mg/dL — ABNORMAL HIGH (ref 70–99)
Glucose-Capillary: 158 mg/dL — ABNORMAL HIGH (ref 70–99)
Glucose-Capillary: 184 mg/dL — ABNORMAL HIGH (ref 70–99)
Glucose-Capillary: 198 mg/dL — ABNORMAL HIGH (ref 70–99)
Glucose-Capillary: 69 mg/dL — ABNORMAL LOW (ref 70–99)

## 2020-02-19 LAB — CULTURE, BLOOD (ROUTINE X 2)
Culture: NO GROWTH
Special Requests: ADEQUATE

## 2020-02-19 MED ORDER — CLONIDINE HCL 0.1 MG PO TABS
0.1000 mg | ORAL_TABLET | Freq: Every day | ORAL | Status: AC
  Administered 2020-02-23: 10:00:00 0.1 mg
  Filled 2020-02-19: qty 1

## 2020-02-19 MED ORDER — CLONIDINE HCL 0.1 MG PO TABS
0.1000 mg | ORAL_TABLET | Freq: Four times a day (QID) | ORAL | Status: AC
  Administered 2020-02-20 – 2020-02-21 (×4): 0.1 mg
  Filled 2020-02-19 (×4): qty 1

## 2020-02-19 MED ORDER — LACTATED RINGERS IV SOLN: INTRAVENOUS | Status: AC

## 2020-02-19 MED ORDER — CLONIDINE HCL 0.1 MG PO TABS
0.1000 mg | ORAL_TABLET | Freq: Two times a day (BID) | ORAL | Status: AC
  Administered 2020-02-22 (×2): 0.1 mg
  Filled 2020-02-19 (×2): qty 1

## 2020-02-19 MED ORDER — CLONIDINE HCL 0.1 MG PO TABS
0.1000 mg | ORAL_TABLET | Freq: Three times a day (TID) | ORAL | Status: AC
  Administered 2020-02-21 (×3): 0.1 mg
  Filled 2020-02-19 (×3): qty 1

## 2020-02-19 MED ORDER — CLONIDINE HCL 0.1 MG PO TABS
0.2000 mg | ORAL_TABLET | Freq: Four times a day (QID) | ORAL | Status: AC
  Administered 2020-02-19 – 2020-02-20 (×3): 0.2 mg
  Filled 2020-02-19: qty 1
  Filled 2020-02-19 (×2): qty 2

## 2020-02-19 NOTE — Progress Notes (Signed)
HD#25 Subjective:   No acute events overnight. Patient afebrile overnight, Tmax 99.9. Supplemental oxygen increased slightly from 5 lpm to 8 lpm, FiO2 28% to 35%.  This morning, patient stable compared to yesterday, spontaneously awake, follows commands to stick out tongue, squeeze hands, nonverbal. Noted to be diaphoretic similar to yesterday, tachycardic in the 130s. Per RN, trach secretions are still copious.  Objective:   Vital signs in last 24 hours: Vitals:   02/19/20 0432 02/19/20 0500 02/19/20 0511 02/19/20 0535  BP:      Pulse:      Resp:      Temp:      TempSrc:      SpO2: 96%  91% 94%  Weight:  101.5 kg    Height:       Supplemental O2: Tracheostomy collar SpO2: 94 % O2 Flow Rate (L/min): 8 L/min FiO2 (%): 35 %  Physical Exam Constitutional: chronically ill-appearing man, sitting upright in bed, in no acute distress HENT: normocephalic atraumatic, mucous membranes moist, diaphoretic Eyes: spontaneously open, roving, tracks examiner Cardiovascular: regular rate and rhythm, no m/r/g Pulmonary/Chest: on the trach collar, coarse breath sounds, no wheezes or rhonchi, spontaneous cough Abdominal: soft, non-tender, non-distended, bowel sounds present Neurological: spontaneously awake, nonverbal, moves all extremities spontaneously, follows commands to stick out tongue and squeeze hands; LUE contracted though wrist limp; spontaneous cough Skin: skin sloughing over bilateral hands  Pertinent Labs: CBC Latest Ref Rng & Units 02/17/2020 02/16/2020 02/15/2020  WBC 4.0 - 10.5 K/uL 10.9(H) 11.1(H) 11.0(H)  Hemoglobin 13.0 - 17.0 g/dL 7.5(T) 7.0(Y) 1.7(C)  Hematocrit 39.0 - 52.0 % 28.0(L) 25.5(L) 26.1(L)  Platelets 150 - 400 K/uL 306 246 238    CMP Latest Ref Rng & Units 02/17/2020 02/16/2020 02/15/2020  Glucose 70 - 99 mg/dL 944(H) 675(F) 82  BUN 6 - 20 mg/dL 16 16(B) 84(Y)  Creatinine 0.61 - 1.24 mg/dL 6.59 9.35 7.01  Sodium 135 - 145 mmol/L 138 134(L) 137   Potassium 3.5 - 5.1 mmol/L 4.1 3.8 4.0  Chloride 98 - 111 mmol/L 102 101 102  CO2 22 - 32 mmol/L 27 23 26   Calcium 8.9 - 10.3 mg/dL 9.5 8.9 9.1  Total Protein 6.5 - 8.1 g/dL 6.3(L) 5.7(L) -  Total Bilirubin 0.3 - 1.2 mg/dL 0.6 0.3 -  Alkaline Phos 38 - 126 U/L 132(H) 119 -  AST 15 - 41 U/L 58(H) 55(H) -  ALT 0 - 44 U/L 89(H) 66(H) -    Assessment/Plan:   Active Problems:   Schizoaffective disorder (HCC)   MDD (major depressive disorder), recurrent, severe, with psychosis (HCC)   DKA (diabetic ketoacidosis) (HCC)   Pancreatitis   Respiratory failure (HCC)   AKI (acute kidney injury) (HCC)   Dehydration   Toxic metabolic encephalopathy   Pressure injury of skin  Patient Summary: Bryan [name] is a 44 y.o. man with history of schizophrenia who presented to [facility] ED on 01/25/20 with acute encephalopathy and lethargy and was admitted for DKA. He was transferred to the ICU on 11/30 with agitation and hypotension, had subsequent PEA arrest. Found to have acute pancreatitis with unclear etiology though found to have elevated triglycerides. Encephalopathy felt to be secondary to anoxia. Started on empiric antibiotics vancomycin/meropenem on 12/7 due to concern for infectious etiology after developing fever and leukocytosis, however vancomycin discontinued after presumed drug reaction. Started on Precedex. Underwent tracheostomy by ENT on 12/18 due to ventilator dependence. Subsequently spiked another fever on 12/19 with CXR findings suspicious for multifocal pneumonia  for which he was started on cefepime and Zyvox with some improvement. Weaned off of Precedex and tolerating trach collar support. Abx switched to IV ceftriaxone for MSSA pneumonia. [facility] transferred initially to [facility] on 12/22 and then IMTS on 12/23.   This is hospital day 25.  Acute hypoxemic respiratory failure due to multifocal pneumonia S/p trach POD #6 Sepsis due to recurrent multifocal pneumonia, now with  MSSA Staph epi bacteremia vs likely contamination Afebrile overnight. Patient spontaneously awake and alert, follows some commands, does not vocalize. Tolerating trach collar with slightly increased O2 needs compared to yesterday. Coarse breath sounds. - Continue ceftriaxone 2 g daily for MSSA pneumonia, today is day 6 of antibiotic treatment; plan to discontinue after tomorrow - Start clonidine wean today, appreciate pharmacy's assistance with this - 0.2 mg q6h starting 12/24 for 1 day - 0.1 mg q6h starting 12/25 for 1 day - 0.1 mg TID starting 12/26 for 1 day - 0.1 mg BID starting 12/27 for 1 day - 0.1 mg once on 12/28 - Trach management per PCCM - ENT to change trach early next week - guaifenesin 100 mg q4h   Acute metabolic encephalopathy S/p PEA cardiac arrest Schizophrenia Minimal agitation over the last 72 hours. - Continue Seroquel 100 mg twice daily - NPO, tube feeds as below - Cardiac telemetry  Tachycardia Metoprolol 50 mg twice daily started 12/22. With patient's diaphoresis and persistent tachycardia, will start maintenance IVF in the event insensible losses are contributing. - Start mIVF LR @ 100 cc/hr for 12 hours - Continue metoprolol 50 mg twice daily  Acute pancreatitis likely 2/2 hypertriglyceridemia, improved Triglycerides on 12/21 were 188, down from 400s on admission. - continue supportive care  New diagnosis of diabetes presenting with DKA Blood glucose well-controlled, ranging 100s-150. - Insulin detemir (Levemir) 40 units twice daily - Novolog 8 units q4h - Hypoglycemia protocol in place - Tube feeds  Decubitus ulcer on coccyx, unstageable Nursing note flaking of skin around buttocks. - Wound care consulted, appreciate their recs   Diet: Tube Feeds, NPO IVF: None VTE: Enoxaparin Code: Full   Please contact the on call pager after 5 pm and on weekends at [phone].  Alphonzo Severance, MD PGY-1 Internal Medicine Teaching Service Pager:  [phone] 02/19/2020

## 2020-02-19 NOTE — Progress Notes (Signed)
Skin Assessment: Pressure Injuries  Right buttock: 2.5 x 0.5 cm Right buttock : 2.5 x 0.2 cm Right buttock: 3.5 x 0.3 cm Right buttock: 1.75 x 0.1 cm Midline Buttock: 3 x 0.2 cm Upper Back: 0.5 x 0.3 cm Upper back: 0.5 x 0.3 cm Left Ear: 2 x 1 cm Generalize skin peeling over arms and hands and rash in the mid lower pelvis area.

## 2020-02-19 NOTE — Progress Notes (Signed)
Received report from pt room 9 and reviewed skin integrity with TEFL teacher and Cabin crew. Performed peri and foley care with Melanee Spry Tom-Johnson. Yankauer suction was set in place, initial assessment was given to pt. Pt is in semi- fowlers position and lowest bed setting.

## 2020-02-19 NOTE — TOC Progression Note (Signed)
Transition of Care [facility]) - Progression Note    Patient Details  Name: Bryan [name] MRN: [id number] Date of Birth: [birth year]  [facility] CM/SW Contact  Lockie Pares, RN Phone Number: 02/19/2020, 10:36 AM  Clinical Narrative:    Continue to monitor patient, now on trach collar, 28%. Very difficult to place in SNF due to insurance. Has mother and grandmother listed, will need extensive PT and OT.  Will work with CSW on services (PCS etc).   patient looks to be in hospital extended period of time, may be close to moving to regular floor from ICU  Expected Discharge Plan: Skilled Nursing Facility Barriers to Discharge: Continued Medical Work up  Expected Discharge Plan and Services Expected Discharge Plan: Skilled Nursing Facility In-house Referral: Clinical Social Work Discharge Planning Services: CM Consult   Living arrangements for the past 2 months: Apartment                                       Social Determinants of Health (SDOH) Interventions    Readmission Risk Interventions No flowsheet data found.

## 2020-02-19 NOTE — Progress Notes (Signed)
Pt transported to 4NP Room 9. Report given to oncoming RN. [facility] at time of transport.

## 2020-02-19 NOTE — Progress Notes (Signed)
Subjective: Off vent, on trach collar. No trach issues.  Objective: Vital signs in last 24 hours: Temp:  [99.1 F (37.3 C)-99.9 F (37.7 C)] 99.2 F (37.3 C) (12/24 0339) Pulse Rate:  [92-139] 103 (12/24 0700) Resp:  [11-37] 22 (12/24 0700) BP: (111-159)/(68-126) 111/84 (12/24 0700) SpO2:  [88 %-100 %] 92 % (12/24 0700) FiO2 (%):  [28 %-35 %] 35 % (12/24 0511) Weight:  [101.5 kg] 101.5 kg (12/24 0500)  Physical Exam: General appearance:On trach collar. Awake, but minimally responsive. Head:Normocephalic, without obvious abnormality, atraumatic Ears: Examination of the ears shows normal auricles and external auditory canals bilaterally. Nose: Nasal examination shows normal mucosa, septum, turbinates.  Face: Facial examination shows no asymmetry.  Mouth:Normal mucosa. No injury. Neck:Trach tube in place. No bleeding. Neuro:Awake but minimally responsive.  Recent Labs    02/17/20 0827  WBC 10.9*  HGB 9.2*  HCT 28.0*  PLT 306   Recent Labs    02/17/20 0827  NA 138  K 4.1  CL 102  CO2 27  GLUCOSE 147*  BUN 16  CREATININE 0.84  CALCIUM 9.5    Medications:  I have reviewed the patient's current medications. Scheduled: . chlorhexidine gluconate (MEDLINE KIT)  15 mL Mouth Rinse BID  . Chlorhexidine Gluconate Cloth  6 each Topical Daily  . cloNIDine  0.2 mg Oral Q6H   Followed by  . [START ON 02/20/2020] cloNIDine  0.1 mg Oral Q6H   Followed by  . [START ON 02/21/2020] cloNIDine  0.1 mg Oral TID   Followed by  . [START ON 02/22/2020] cloNIDine  0.1 mg Oral BID   Followed by  . [START ON 02/23/2020] cloNIDine  0.1 mg Oral Daily  . docusate  100 mg Per Tube BID  . enoxaparin (LOVENOX) injection  40 mg Subcutaneous Q24H  . feeding supplement (PROSource TF)  45 mL Per Tube 5 X Daily  . Gerhardt's butt cream   Topical BID  . guaiFENesin  5 mL Per Tube Q4H  . insulin aspart  0-15 Units Subcutaneous Q4H  . insulin aspart  8 Units Subcutaneous Q4H  . insulin  detemir  40 Units Subcutaneous BID  . mouth rinse  15 mL Mouth Rinse 10 times per day  . metoprolol tartrate  50 mg Per Tube BID  . nutrition supplement (JUVEN)  1 packet Per Tube BID BM  . pantoprazole sodium  40 mg Per Tube Daily  . QUEtiapine  100 mg Per Tube q morning - 10a   And  . QUEtiapine  100 mg Per Tube QHS   Continuous: . sodium chloride 250 mL (02/08/20 1825)  . cefTRIAXone (ROCEPHIN)  IV Stopped (02/18/20 1311)  . feeding supplement (OSMOLITE 1.5 CAL) 1,000 mL (02/19/20 0200)    Assessment/Plan: POD #6s/p trach. On trach collar. - No trach issues. - Routine fresh trach care. - Will change trachearly next week.    LOS: 25 days   Dayjah Selman W Momoko Slezak 02/19/2020, 7:57 AM

## 2020-02-20 LAB — GLUCOSE, CAPILLARY
Glucose-Capillary: 169 mg/dL — ABNORMAL HIGH (ref 70–99)
Glucose-Capillary: 132 mg/dL — ABNORMAL HIGH (ref 70–99)
Glucose-Capillary: 181 mg/dL — ABNORMAL HIGH (ref 70–99)
Glucose-Capillary: 170 mg/dL — ABNORMAL HIGH (ref 70–99)
Glucose-Capillary: 166 mg/dL — ABNORMAL HIGH (ref 70–99)
Glucose-Capillary: 172 mg/dL — ABNORMAL HIGH (ref 70–99)
Glucose-Capillary: 199 mg/dL — ABNORMAL HIGH (ref 70–99)

## 2020-02-20 MED ORDER — SODIUM CHLORIDE 0.9 % IV SOLN: INTRAVENOUS | Status: AC

## 2020-02-20 NOTE — Progress Notes (Signed)
This RN took pts CBG at 2000 after pt had a delay in restarting tube feed when transferred and CBG was 69, 66ml dextrose 50% was given and at 2025 pt's CBG was 124.  2000 insulin coverage was not given and pt has not had any other episodes of hypoglycemia overnight, will continue to monitor CBGs

## 2020-02-20 NOTE — Progress Notes (Signed)
HD#26 Subjective:   No acute events overnight. Patient afebrile overnight, Tmax 99.6.   This morning, patient stable compared to yesterday though less diaphoretic. Able to stick out his tongue on command, but unable to follow other commands. Tachycardia improved to low 100s, per RN AM metoprolol given ahead of schedule. BP 120s/80s.    Objective:   Vital signs in last 24 hours: Vitals:   02/20/20 0230 02/20/20 0359 02/20/20 0500 02/20/20 0600  BP: (!) 130/96 (!) 164/83  (!) 152/97  Pulse: (!) 120 (!) 118  (!) 128  Resp: (!) 25 19  20   Temp: 99.4 F (37.4 C) 98.8 F (37.1 C)  99 F (37.2 C)  TempSrc: Axillary Axillary  Axillary  SpO2:  97%  97%  Weight:   97.3 kg   Height:       Supplemental O2: Tracheostomy collar SpO2: 97 % O2 Flow Rate (L/min): 8 L/min FiO2 (%): 35 %  Physical Exam Constitutional: chronically ill-appearing man, sitting upright in bed, in no acute distress HENT: normocephalic atraumatic, mucous membranes moist, less diaphoretic compared to prior Eyes: spontaneously open, roving, tracks examiner Cardiovascular: regular rate and rhythm, no m/r/g Pulmonary/Chest: on the trach collar, breath sounds less coarse today, no wheezes or rhonchi Abdominal: soft, non-tender, non-distended, bowel sounds present Neurological: spontaneously awake, nonverbal, moves all extremities spontaneously, follows commands to stick out tongue   Pertinent Labs: CBC Latest Ref Rng & Units 02/17/2020 02/16/2020 02/15/2020  WBC 4.0 - 10.5 K/uL 10.9(H) 11.1(H) 11.0(H)  Hemoglobin 13.0 - 17.0 g/dL 02/17/2020) 8.4(O) 9.6(E)  Hematocrit 39.0 - 52.0 % 28.0(L) 25.5(L) 26.1(L)  Platelets 150 - 400 K/uL 306 246 238    CMP Latest Ref Rng & Units 02/17/2020 02/16/2020 02/15/2020  Glucose 70 - 99 mg/dL 02/17/2020) 841(L) 82  BUN 6 - 20 mg/dL 16 244(W) 10(U)  Creatinine 0.61 - 1.24 mg/dL 72(Z 3.66 4.40  Sodium 135 - 145 mmol/L 138 134(L) 137  Potassium 3.5 - 5.1 mmol/L 4.1 3.8 4.0  Chloride 98  - 111 mmol/L 102 101 102  CO2 22 - 32 mmol/L 27 23 26   Calcium 8.9 - 10.3 mg/dL 9.5 8.9 9.1  Total Protein 6.5 - 8.1 g/dL 6.3(L) 5.7(L) -  Total Bilirubin 0.3 - 1.2 mg/dL 0.6 0.3 -  Alkaline Phos 38 - 126 U/L 132(H) 119 -  AST 15 - 41 U/L 58(H) 55(H) -  ALT 0 - 44 U/L 89(H) 66(H) -    Assessment/Plan:   Active Problems:   Schizoaffective disorder (HCC)   MDD (major depressive disorder), recurrent, severe, with psychosis (HCC)   DKA (diabetic ketoacidosis) (HCC)   Pancreatitis   Respiratory failure (HCC)   AKI (acute kidney injury) (HCC)   Dehydration   Toxic metabolic encephalopathy   Pressure injury of skin  Patient Summary: Bryan [name] is a 44 y.o. man with history of schizophrenia who presented to [facility] ED on 01/25/20 with acute encephalopathy and lethargy and was admitted for DKA. He was transferred to the ICU on 11/30 with agitation and hypotension, had subsequent PEA arrest. Found to have acute pancreatitis with unclear etiology though found to have elevated triglycerides. Encephalopathy felt to be secondary to anoxia. Started on empiric antibiotics vancomycin/meropenem on 12/7 due to concern for infectious etiology after developing fever and leukocytosis, however vancomycin discontinued after presumed drug reaction. Started on Precedex. Underwent tracheostomy by ENT on 12/18 due to ventilator dependence. Subsequently spiked another fever on 12/19 with CXR findings suspicious for multifocal pneumonia for which he  was started on cefepime and Zyvox with some improvement. Weaned off of Precedex and tolerating trach collar support. Abx switched to IV ceftriaxone for MSSA pneumonia. Care transferred initially to [facility] on 12/22 and then IMTS on 12/23.   This is hospital day 26.  Acute hypoxemic respiratory failure due to multifocal pneumonia S/p trach POD #7 Sepsis due to recurrent multifocal pneumonia, now with MSSA Staph epi bacteremia vs likely contamination Afebrile  overnight. Exam stable, patient spontaneously awake and alert, follows some commands, does not vocalize. Tolerating trach collar, breath sounds less coarse today. - Continue ceftriaxone 2 g daily for MSSA pneumonia, today is day 7 of antibiotic treatment; discontinue after today's dose - Continue clonidine wean, appreciate pharmacy's assistance with this -   - 0.1 mg q6h starting 12/25 for 1 day - 0.1 mg TID starting 12/26 for 1 day - 0.1 mg BID starting 12/27 for 1 day - 0.1 mg once on 12/28 - Trach management per PCCM - ENT to change trach early next week - guaifenesin 100 mg q4h   Acute metabolic encephalopathy S/p PEA cardiac arrest Schizophrenia Minimal agitation over the last 72 hours. - Continue Seroquel 100 mg twice daily - NPO, tube feeds as below - Cardiac telemetry  Tachycardia Metoprolol 50 mg twice daily started 12/22. Tachycardia improved yesterday after mIVF. Continue mIVF. - mIVF NS @ 100 cc/hr for 12 hours - Continue metoprolol 50 mg twice daily  New diagnosis of diabetes presenting with DKA Blood glucose well-controlled, ranging 100s-150. - Insulin detemir (Levemir) 40 units twice daily - Novolog 8 units q4h - Hypoglycemia protocol in place - Tube feeds  Decubitus ulcer on coccyx, unstageable Nursing note flaking of skin around buttocks. - Wound care consulted, appreciate their recs   Diet: Tube Feeds, NPO IVF: None VTE: Enoxaparin Code: Full   Please contact the on call pager after 5 pm and on weekends at [phone].  Alphonzo Severance, MD PGY-1 Internal Medicine Teaching Service Pager: [phone] 02/20/2020

## 2020-02-20 NOTE — Progress Notes (Signed)
Pt HR sustaining in high 120s-130s consistently despite airway suctioning and pain medications, O2 saturation stable in upper 90s with tracheostomy collar, MD paged and called back with no new orders and stated that the team would round on this pt early today, morning medications were given and will pass this information along to the next shift

## 2020-02-21 LAB — BASIC METABOLIC PANEL
Anion gap: 10 (ref 5–15)
BUN: 21 mg/dL — ABNORMAL HIGH (ref 6–20)
CO2: 23 mmol/L (ref 22–32)
Calcium: 9.8 mg/dL (ref 8.9–10.3)
Chloride: 101 mmol/L (ref 98–111)
Creatinine, Ser: 0.8 mg/dL (ref 0.61–1.24)
GFR, Estimated: >60 mL/min (ref >60)
Glucose, Bld: 146 mg/dL — ABNORMAL HIGH (ref 70–99)
Potassium: 4.2 mmol/L (ref 3.5–5.1)
Sodium: 134 mmol/L — ABNORMAL LOW (ref 135–145)

## 2020-02-21 LAB — GLUCOSE, CAPILLARY
Glucose-Capillary: 116 mg/dL — ABNORMAL HIGH (ref 70–99)
Glucose-Capillary: 119 mg/dL — ABNORMAL HIGH (ref 70–99)
Glucose-Capillary: 128 mg/dL — ABNORMAL HIGH (ref 70–99)
Glucose-Capillary: 141 mg/dL — ABNORMAL HIGH (ref 70–99)
Glucose-Capillary: 142 mg/dL — ABNORMAL HIGH (ref 70–99)
Glucose-Capillary: 145 mg/dL — ABNORMAL HIGH (ref 70–99)

## 2020-02-21 LAB — CBC WITH DIFFERENTIAL/PLATELET
Abs Immature Granulocytes: 0.13 10*3/uL — ABNORMAL HIGH (ref 0.00–0.07)
Basophils Absolute: 0.1 10*3/uL (ref 0.0–0.1)
Basophils Relative: 0 %
Eosinophils Absolute: 0.3 10*3/uL (ref 0.0–0.5)
Eosinophils Relative: 2 %
HCT: 32.7 % — ABNORMAL LOW (ref 39.0–52.0)
Hemoglobin: 10.4 g/dL — ABNORMAL LOW (ref 13.0–17.0)
Immature Granulocytes: 1 %
Lymphocytes Relative: 15 %
Lymphs Abs: 2.3 10*3/uL (ref 0.7–4.0)
MCH: 29.9 pg (ref 26.0–34.0)
MCHC: 31.8 g/dL (ref 30.0–36.0)
MCV: 94 fL (ref 80.0–100.0)
Monocytes Absolute: 1.3 10*3/uL — ABNORMAL HIGH (ref 0.1–1.0)
Monocytes Relative: 9 %
Neutro Abs: 10.8 10*3/uL — ABNORMAL HIGH (ref 1.7–7.7)
Neutrophils Relative %: 73 %
Platelets: 402 10*3/uL — ABNORMAL HIGH (ref 150–400)
RBC: 3.48 MIL/uL — ABNORMAL LOW (ref 4.22–5.81)
RDW: 14.4 % (ref 11.5–15.5)
WBC: 14.8 10*3/uL — ABNORMAL HIGH (ref 4.0–10.5)
nRBC: 0 % (ref 0.0–0.2)

## 2020-02-21 MED ORDER — LACTATED RINGERS IV SOLN: INTRAVENOUS | Status: AC

## 2020-02-21 NOTE — Progress Notes (Signed)
HD#27 Subjective:   No acute events overnight. Patient afebrile overnight, Tmax 98.9.   This morning, his exam is stable. Follows commands to stick out tongue. HR in the low 100s.   Objective:   Vital signs in last 24 hours: Vitals:   02/21/20 0408 02/21/20 0417 02/21/20 0435 02/21/20 0500  BP: (!) 125/97 (!) 125/97    Pulse: (!) 106 (!) 120 (!) 110   Resp: 19 20 20    Temp: 98.9 F (37.2 C)     TempSrc: Axillary     SpO2: 94% 95% 95%   Weight:    98 kg  Height:       Supplemental O2: Tracheostomy collar SpO2: 95 % O2 Flow Rate (L/min): 8 L/min FiO2 (%): 35 %  Physical Exam Constitutional: chronically ill-appearing man, sitting upright in bed, in no acute distress HENT: normocephalic atraumatic, mucous membranes moist Eyes: spontaneously open, roving, tracks examiner Cardiovascular: regular rate and rhythm, no m/r/g Pulmonary/Chest: on the trach collar, breath sounds less coarse today, no wheezes or rhonchi Abdominal: soft, non-tender, non-distended, bowel sounds present Neurological: spontaneously awake, nonverbal, moves all extremities spontaneously, follows commands to stick out tongue   Pertinent Labs: CBC Latest Ref Rng & Units 02/21/2020 02/17/2020 02/16/2020  WBC 4.0 - 10.5 K/uL 14.8(H) 10.9(H) 11.1(H)  Hemoglobin 13.0 - 17.0 g/dL 10.4(L) 9.2(L) 8.0(L)  Hematocrit 39.0 - 52.0 % 32.7(L) 28.0(L) 25.5(L)  Platelets 150 - 400 K/uL 402(H) 306 246    CMP Latest Ref Rng & Units 02/17/2020 02/16/2020 02/15/2020  Glucose 70 - 99 mg/dL 02/17/2020) 409(W) 82  BUN 6 - 20 mg/dL 16 119(J) 47(W)  Creatinine 0.61 - 1.24 mg/dL 29(F 6.21 3.08  Sodium 135 - 145 mmol/L 138 134(L) 137  Potassium 3.5 - 5.1 mmol/L 4.1 3.8 4.0  Chloride 98 - 111 mmol/L 102 101 102  CO2 22 - 32 mmol/L 27 23 26   Calcium 8.9 - 10.3 mg/dL 9.5 8.9 9.1  Total Protein 6.5 - 8.1 g/dL 6.3(L) 5.7(L) -  Total Bilirubin 0.3 - 1.2 mg/dL 0.6 0.3 -  Alkaline Phos 38 - 126 U/L 132(H) 119 -  AST 15 - 41 U/L  58(H) 55(H) -  ALT 0 - 44 U/L 89(H) 66(H) -    Assessment/Plan:   Active Problems:   Schizoaffective disorder (HCC)   MDD (major depressive disorder), recurrent, severe, with psychosis (HCC)   DKA (diabetic ketoacidosis) (HCC)   Pancreatitis   Respiratory failure (HCC)   AKI (acute kidney injury) (HCC)   Dehydration   Toxic metabolic encephalopathy   Pressure injury of skin  Patient Summary: Bryan [name] is a 44 y.o. man with history of schizophrenia who presented to [facility] ED on 01/25/20 with acute encephalopathy and lethargy and was admitted for DKA. He was transferred to the ICU on 11/30 with agitation and hypotension, had subsequent PEA arrest. Found to have acute pancreatitis with unclear etiology though found to have elevated triglycerides. Encephalopathy felt to be secondary to anoxia. Started on empiric antibiotics vancomycin/meropenem on 12/7 due to concern for infectious etiology after developing fever and leukocytosis, however vancomycin discontinued after presumed drug reaction. Started on Precedex. Underwent tracheostomy by ENT on 12/18 due to ventilator dependence. Subsequently spiked another fever on 12/19 with CXR findings suspicious for multifocal pneumonia for which he was started on cefepime and Zyvox with some improvement. Weaned off of Precedex and tolerating trach collar support. Abx switched to IV ceftriaxone for MSSA pneumonia. Care transferred initially to [facility] on 12/22 and then  IMTS on 12/23.   This is hospital day 27.  Acute hypoxemic respiratory failure due to multifocal pneumonia S/p trach POD #8 Sepsis due to recurrent multifocal pneumonia, now with MSSA Staph epi bacteremia vs likely contamination Afebrile overnight. Exam stable, patient spontaneously awake and alert, follows some commands, does not vocalize. Tolerating trach collar. - s/p 7-day course of antibiotics for MSSA pneumonia (Last day: 02/20/20) - Continue clonidine wean, appreciate  pharmacy's assistance with this -   -  - 0.1 mg TID starting 12/26 for 1 day - 0.1 mg BID starting 12/27 for 1 day - 0.1 mg once on 12/28 - Trach management per PCCM - ENT to change trach early next week - guaifenesin 100 mg q4h   Acute metabolic encephalopathy S/p PEA cardiac arrest Schizophrenia Minimal agitation over the last 72 hours. - Continue Seroquel 100 mg twice daily - NPO, tube feeds as below - Cardiac telemetry  Tachycardia Metoprolol 50 mg twice daily started 12/22. Tachycardia improving with mIVF. Continue mIVF. - mIVF NS @ 100 cc/hr for 12 hours - Continue metoprolol 50 mg twice daily  New diagnosis of diabetes presenting with DKA Blood glucose well-controlled, ranging 100s-150. - Insulin detemir (Levemir) 40 units twice daily - Novolog 8 units q4h - Hypoglycemia protocol in place - Tube feeds  Decubitus ulcer on coccyx, unstageable Nursing note flaking of skin around buttocks. - Wound care consulted, appreciate their recs   Diet: Tube Feeds, NPO IVF: None VTE: Enoxaparin Code: Full   Please contact the on call pager after 5 pm and on weekends at [phone].  Alphonzo Severance, MD PGY-1 Internal Medicine Teaching Service Pager: [phone] 02/21/2020

## 2020-02-21 NOTE — Progress Notes (Signed)
   02/21/20 1938  Assess: MEWS Score  BP (!) 126/93  Pulse Rate (!) 115  ECG Heart Rate (!) 113  Resp 12  Level of Consciousness Alert  SpO2 99 %  O2 Device Tracheostomy Collar  Patient Activity (if Appropriate) In bed  O2 Flow Rate (L/min) 8 L/min  FiO2 (%) 35 %  Assess: MEWS Score  MEWS Temp 0  MEWS Systolic 0  MEWS Pulse 2  MEWS RR 1  MEWS LOC 0  MEWS Score 3  MEWS Score Color Yellow  Assess: if the MEWS score is Yellow or Red  Were vital signs taken at a resting state? Yes  Focused Assessment No change from prior assessment  Early Detection of Sepsis Score *See Row Information* High  MEWS guidelines implemented *See Row Information* Yes  Take Vital Signs  Increase Vital Sign Frequency  Yellow: Q 2hr X 2 then Q 4hr X 2, if remains yellow, continue Q 4hrs  Escalate  MEWS: Escalate Yellow: discuss with charge nurse/RN and consider discussing with provider and RRT  Notify: Charge Nurse/RN  Name of Charge Nurse/RN Notified Moriah, RN  Date Charge Nurse/RN Notified 02/21/20  Time Charge Nurse/RN Notified 1945  Document  Patient Outcome Other (Comment) (no intervention needed; trach pt, baseline)

## 2020-02-22 LAB — CBC WITH DIFFERENTIAL/PLATELET
Abs Immature Granulocytes: 0.12 10*3/uL — ABNORMAL HIGH (ref 0.00–0.07)
Basophils Absolute: 0 10*3/uL (ref 0.0–0.1)
Basophils Relative: 0 %
Eosinophils Absolute: 0.2 10*3/uL (ref 0.0–0.5)
Eosinophils Relative: 2 %
HCT: 32.5 % — ABNORMAL LOW (ref 39.0–52.0)
Hemoglobin: 10.4 g/dL — ABNORMAL LOW (ref 13.0–17.0)
Immature Granulocytes: 1 %
Lymphocytes Relative: 17 %
Lymphs Abs: 2.2 10*3/uL (ref 0.7–4.0)
MCH: 29.8 pg (ref 26.0–34.0)
MCHC: 32 g/dL (ref 30.0–36.0)
MCV: 93.1 fL (ref 80.0–100.0)
Monocytes Absolute: 1 10*3/uL (ref 0.1–1.0)
Monocytes Relative: 8 %
Neutro Abs: 9.2 10*3/uL — ABNORMAL HIGH (ref 1.7–7.7)
Neutrophils Relative %: 72 %
Platelets: 411 10*3/uL — ABNORMAL HIGH (ref 150–400)
RBC: 3.49 MIL/uL — ABNORMAL LOW (ref 4.22–5.81)
RDW: 14.2 % (ref 11.5–15.5)
WBC: 12.9 10*3/uL — ABNORMAL HIGH (ref 4.0–10.5)
nRBC: 0 % (ref 0.0–0.2)

## 2020-02-22 LAB — GLUCOSE, CAPILLARY
Glucose-Capillary: 103 mg/dL — ABNORMAL HIGH (ref 70–99)
Glucose-Capillary: 148 mg/dL — ABNORMAL HIGH (ref 70–99)
Glucose-Capillary: 174 mg/dL — ABNORMAL HIGH (ref 70–99)
Glucose-Capillary: 153 mg/dL — ABNORMAL HIGH (ref 70–99)
Glucose-Capillary: 134 mg/dL — ABNORMAL HIGH (ref 70–99)

## 2020-02-22 LAB — TRIGLYCERIDES: Triglycerides: 282 mg/dL — ABNORMAL HIGH (ref <150)

## 2020-02-22 MED ORDER — SODIUM CHLORIDE 0.9 % IV SOLN: INTRAVENOUS | Status: AC

## 2020-02-22 NOTE — Progress Notes (Signed)
Subjective: On trach collar over the weekend. Minimally responsive.  Objective: Vital signs in last 24 hours: Temp:  [98.2 F (36.8 C)-99.6 F (37.6 C)] 98.8 F (37.1 C) (12/27 0713) Pulse Rate:  [94-120] 120 (12/27 0753) Resp:  [12-33] 16 (12/27 0753) BP: (102-142)/(78-109) 139/109 (12/27 0753) SpO2:  [90 %-99 %] 96 % (12/27 0753) FiO2 (%):  [35 %] 35 % (12/27 0753) Weight:  [98.8 kg] 98.8 kg (12/27 0406)  Physical Exam: General appearance:On trach collar. Awake, but minimally responsive. Head:Normocephalic, without obvious abnormality, atraumatic Ears: Examination of the ears shows normal auricles and external auditory canals bilaterally. Nose: Nasal examination shows normal mucosa, septum, turbinates.  Face: Facial examination shows no asymmetry.  Mouth:Normal mucosa. No injury. Neck:Trach tube in place. No bleeding. Neuro:Awake but minimally responsive.  Recent Labs    02/21/20 0306 02/22/20 0719  WBC 14.8* 12.9*  HGB 10.4* 10.4*  HCT 32.7* 32.5*  PLT 402* 411*   Recent Labs    02/21/20 0306  NA 134*  K 4.2  CL 101  CO2 23  GLUCOSE 146*  BUN 21*  CREATININE 0.80  CALCIUM 9.8    Medications:  I have reviewed the patient's current medications. Scheduled: . chlorhexidine gluconate (MEDLINE KIT)  15 mL Mouth Rinse BID  . Chlorhexidine Gluconate Cloth  6 each Topical Daily  . cloNIDine  0.1 mg Per Tube BID   Followed by  . [START ON 02/23/2020] cloNIDine  0.1 mg Per Tube Daily  . docusate  100 mg Per Tube BID  . enoxaparin (LOVENOX) injection  40 mg Subcutaneous Q24H  . feeding supplement (PROSource TF)  45 mL Per Tube 5 X Daily  . Gerhardt's butt cream   Topical BID  . guaiFENesin  5 mL Per Tube Q4H  . insulin aspart  0-15 Units Subcutaneous Q4H  . insulin aspart  8 Units Subcutaneous Q4H  . insulin detemir  40 Units Subcutaneous BID  . mouth rinse  15 mL Mouth Rinse 10 times per day  . metoprolol tartrate  50 mg Per Tube BID  . nutrition  supplement (JUVEN)  1 packet Per Tube BID BM  . pantoprazole sodium  40 mg Per Tube Daily  . QUEtiapine  100 mg Per Tube q morning - 10a   And  . QUEtiapine  100 mg Per Tube QHS   Continuous: . sodium chloride 250 mL (02/08/20 1825)  . feeding supplement (OSMOLITE 1.5 CAL) 1,000 mL (02/22/20 0305)    Assessment/Plan: POD #9s/p trach. On trach collar. - Will change to an uncuffed #6 Shiley trach tube. Awaiting trach supply. - Routine trach care.   LOS: 28 days   Nadra Hritz W Zenith Kercheval 02/22/2020, 8:19 AM

## 2020-02-22 NOTE — Progress Notes (Signed)
   02/22/20 0340  Assess: MEWS Score  Temp 98.6 F (37 C)  BP 130/89  Pulse Rate (!) 109  ECG Heart Rate (!) 109  Resp (!) 22  Level of Consciousness Alert  SpO2 95 %  O2 Device Tracheostomy Collar  Patient Activity (if Appropriate) In bed  O2 Flow Rate (L/min) 8 L/min  FiO2 (%) 35 %  Assess: MEWS Score  MEWS Temp 0  MEWS Systolic 0  MEWS Pulse 1  MEWS RR 1  MEWS LOC 0  MEWS Score 2  MEWS Score Color Yellow  Assess: if the MEWS score is Yellow or Red  Were vital signs taken at a resting state? Yes  Focused Assessment No change from prior assessment  Early Detection of Sepsis Score *See Row Information* High  MEWS guidelines implemented *See Row Information* Yes  Treat  Pain Scale PAINAD  Breathing 1  Negative Vocalization 0  Facial Expression 0  Body Language 0  Consolability 0  PAINAD Score 1  Take Vital Signs  Increase Vital Sign Frequency  Yellow: Q 2hr X 2 then Q 4hr X 2, if remains yellow, continue Q 4hrs  Escalate  MEWS: Escalate Yellow: discuss with charge nurse/RN and consider discussing with provider and RRT  Notify: Charge Nurse/RN  Name of Charge Nurse/RN Notified Moriah, RN  Date Charge Nurse/RN Notified 02/22/20  Time Charge Nurse/RN Notified [phone]  Document  Patient Outcome Other (Comment) (no intervention needed)

## 2020-02-22 NOTE — Evaluation (Signed)
Occupational Therapy Evaluation Patient Details Name: Bryan [name] MRN: [id number] DOB: [birth year] Today's Date: 02/22/2020    History of Present Illness Pt is a 44 y.o. man with history of schizophrenia who presented to [facility] ED on 01/25/20 with acute encephalopathy and lethargy and was admitted for DKA. He was transferred to the ICU on 11/30 with agitation and hypotension, had subsequent PEA arrest. Found to have acute pancreatitis with unclear etiology though found to have elevated triglycerides. Encephalopathy felt to be secondary to anoxia. Pt is s/p trach on 12/18 due to ventilator dependence. Subsequently spiked another fever on 12/19 with CXR findings suspicious for multifocal pneumonia   Clinical Impression   This 44 y/o male presents with the above. Pt currently presenting with deficits including notable weakness, impaired cognition, decreased sitting balance and mobility status. Pt currently requiring two person assist for safe completion of bed mobility tasks, up to totalA for ADL at this time. He tolerated sitting EOB approx 7-10 min with fluctuating levels of assist (min-maxA) and working on righting reactions/reaching and attaining midline without assist; pt was able to self-correct and maintain static balance for very short period of time (approx 30 sec) with close minguard assist. HR up to 130 bpm with activity. Pt following simple motor commands (approx 50-75%) given multimodal cues and attempts to verbalize x2 occasions during session. He will benefit from continued acute OT services and currently recommend post acute rehab services (LTACH vs SNF) to further progress his overall safety and independence with ADL and mobility.     Follow Up Recommendations  LTACH;SNF (LTACH vs SNF)    Equipment Recommendations  Other (comment) (TBD)           Precautions / Restrictions Precautions Precautions: Fall Precaution Comments: NG, flexi Restrictions Weight Bearing Restrictions: No       Mobility Bed Mobility Overal bed mobility: Needs Assistance Bed Mobility: Supine to Sit;Sit to Supine     Supine to sit: Total assist;+2 for physical assistance;+2 for safety/equipment Sit to supine: Total assist;+2 for physical assistance;+2 for safety/equipment   General bed mobility comments: assist for LEs and trunk, pt with some initiation to assist when transitioning to EOB but remains <25%    Transfers                 General transfer comment: not attempted today    Balance Overall balance assessment: Needs assistance Sitting-balance support: Feet supported Sitting balance-Leahy Scale: Poor Sitting balance - Comments: fluctating min-maxA for balance                                   ADL either performed or assessed with clinical judgement   ADL Overall ADL's : Needs assistance/impaired Eating/Feeding: NPO   Grooming: Total assistance;Sitting;Wash/dry face Grooming Details (indicate cue type and reason): via hand over hand                               General ADL Comments: pt is currently totalA for ADL     Vision   Vision Assessment?: Vision impaired- to be further tested in functional context Additional Comments: pt does initiate turning head L/R to follow stimulus; max cues to visually track - noted delayed tracking and inability to fully track to L/R peripheral fields (eyes tending to maintain at or close to midline)     Perception     Praxis  Pertinent Vitals/Pain Pain Assessment: Faces Faces Pain Scale: Hurts a little bit Pain Location: LUE with certain ROM Pain Descriptors / Indicators: Discomfort Pain Intervention(s): Monitored during session     Hand Dominance     Extremity/Trunk Assessment Upper Extremity Assessment Upper Extremity Assessment: LUE deficits/detail;RUE deficits/detail;Generalized weakness;Difficult to assess due to impaired cognition RUE Deficits / Details: notable weakness  throughout, able to flex/extend digits to command RUE Coordination: decreased fine motor;decreased gross motor LUE Deficits / Details: notable weakness throughout, able to flex digits to command. increased flexor tone noted in elbow/wrist with ROM attempts LUE Coordination: decreased fine motor;decreased gross motor   Lower Extremity Assessment Lower Extremity Assessment: Defer to PT evaluation   Cervical / Trunk Assessment Cervical / Trunk Assessment: Kyphotic;Other exceptions Cervical / Trunk Exceptions: notable cervical weakness, requires assist to maintain head upright   Communication Communication Communication: Expressive difficulties   Cognition Arousal/Alertness: Awake/alert Behavior During Therapy: Flat affect Overall Cognitive Status: Impaired/Different from baseline Area of Impairment: Attention;Following commands;Awareness;Problem solving                   Current Attention Level: Focused   Following Commands: Follows one step commands with increased time;Follows one step commands inconsistently   Awareness: Intellectual Problem Solving: Slow processing;Decreased initiation;Requires verbal cues;Requires tactile cues General Comments: pt following multiple simple motor commands during session, occasionally given tactile/verbal cues and repetition to do so. he also attempts to verbalize x2 occasions, mouthing "a little bit" when asked if his LUE was sore with ROM   General Comments  HR up to 130 with activity seated EOB    Exercises     Shoulder Instructions      Home Living Family/patient expects to be discharged to:: Private residence                                 Additional Comments: per chart pt was living with brothers PTA, pt currently unable to provide home setup.      Prior Functioning/Environment          Comments: suspect some level of independence PTA        OT Problem List: Decreased strength;Decreased range of  motion;Decreased activity tolerance;Impaired balance (sitting and/or standing);Decreased cognition;Decreased safety awareness;Impaired UE functional use;Cardiopulmonary status limiting activity;Decreased knowledge of precautions;Decreased knowledge of use of DME or AE;Decreased coordination;Impaired vision/perception      OT Treatment/Interventions: Self-care/ADL training;Therapeutic exercise;Energy conservation;DME and/or AE instruction;Therapeutic activities;Cognitive remediation/compensation;Patient/family education;Balance training;Visual/perceptual remediation/compensation;Neuromuscular education    OT Goals(Current goals can be found in the care plan section) Acute Rehab OT Goals Patient Stated Goal: unable to state OT Goal Formulation: Patient unable to participate in goal setting Time For Goal Achievement: 03/07/20 Potential to Achieve Goals: Fair  OT Frequency: Min 2X/week   Barriers to D/C:            Co-evaluation PT/OT/SLP Co-Evaluation/Treatment: Yes Reason for Co-Treatment: For patient/therapist safety;To address functional/ADL transfers   OT goals addressed during session: ADL's and self-care      AM-PAC OT "6 Clicks" Daily Activity     Outcome Measure Help from another person eating meals?: Total Help from another person taking care of personal grooming?: Total Help from another person toileting, which includes using toliet, bedpan, or urinal?: Total Help from another person bathing (including washing, rinsing, drying)?: Total Help from another person to put on and taking off regular upper body clothing?: Total Help from another person to put  on and taking off regular lower body clothing?: Total 6 Click Score: 6   End of Session Equipment Utilized During Treatment: Oxygen Nurse Communication: Mobility status  Activity Tolerance: Patient tolerated treatment well Patient left: in bed;with call bell/phone within reach;with bed alarm set;with nursing/sitter in  room  OT Visit Diagnosis: Muscle weakness (generalized) (M62.81);Other abnormalities of gait and mobility (R26.89);Other symptoms and signs involving cognitive function                Time: 2778-2423 OT Time Calculation (min): 21 min Charges:  OT General Charges $OT Visit: 1 Visit OT Evaluation $OT Eval Moderate Complexity: 1 Mod  Marcy Siren, OT Acute Rehabilitation Services Pager [phone] Office [phone]  Orlando Penner 02/22/2020, 10:11 AM

## 2020-02-22 NOTE — Progress Notes (Signed)
   02/21/20 2340  Assess: MEWS Score  Temp 98.9 F (37.2 C)  BP (!) 142/101  Pulse Rate (!) 111  ECG Heart Rate (!) 108  Resp (!) 33  Level of Consciousness Alert  SpO2 95 %  O2 Device Tracheostomy Collar  Patient Activity (if Appropriate) In bed  O2 Flow Rate (L/min) 8 L/min  FiO2 (%) 35 %  Assess: MEWS Score  MEWS Temp 0  MEWS Systolic 0  MEWS Pulse 1  MEWS RR 2  MEWS LOC 0  MEWS Score 3  MEWS Score Color Yellow  Assess: if the MEWS score is Yellow or Red  Were vital signs taken at a resting state? Yes  Focused Assessment Change from prior assessment (see assessment flowsheet)  Early Detection of Sepsis Score *See Row Information* High  MEWS guidelines implemented *See Row Information* Yes  Treat  Pain Scale PAINAD  Pain Score 0  Take Vital Signs  Increase Vital Sign Frequency  Yellow: Q 2hr X 2 then Q 4hr X 2, if remains yellow, continue Q 4hrs  Escalate  MEWS: Escalate Yellow: discuss with charge nurse/RN and consider discussing with provider and RRT  Notify: Charge Nurse/RN  Name of Charge Nurse/RN Notified Moriah, RN  Date Charge Nurse/RN Notified 02/21/20  Time Charge Nurse/RN Notified [id number]

## 2020-02-22 NOTE — Progress Notes (Signed)
Trach sutures removed by Dr. Suszanne Conners. Per Dr. Suszanne Conners please downsize pt to #6 cuffless trach. Okay for RT to do if have time otherwise MD will do tomorrow 12/28. RN aware, supplies to be ordered. RT will continue to monitor and be available as needed.

## 2020-02-22 NOTE — Progress Notes (Addendum)
HD#28 Subjective:   No acute events overnight. Patient afebrile overnight, Tmax 98.8. Yesterday had Tmax of 100.5.  Stable on exam this morning. HR in the low 100s.   Objective:   Vital signs in last 24 hours: Vitals:   02/22/20 0329 02/22/20 0340 02/22/20 0406 02/22/20 0436  BP: (!) 130/105 130/89  (!) 122/93  Pulse: (!) 116 (!) 109  (!) 120  Resp: (!) 33 (!) 22  (!) 23  Temp: 98.6 F (37 C) 98.6 F (37 C)    TempSrc: Oral Oral    SpO2: 97% 95%  96%  Weight:   98.8 kg   Height:       Supplemental O2: Tracheostomy collar SpO2: 96 % O2 Flow Rate (L/min): 8 L/min FiO2 (%): 35 %  Physical Exam Constitutional: chronically ill-appearing man, sitting upright in bed, in no acute distress HENT: normocephalic atraumatic, mucous membranes moist Eyes: spontaneously open, roving, tracks examiner Cardiovascular: regular rate and rhythm, no m/r/g Pulmonary/Chest: on the trach collar, breath sounds less coarse today, no wheezes or rhonchi Abdominal: soft, non-tender, non-distended, bowel sounds present Neurological: spontaneously awake, nonverbal, moves all extremities spontaneously, follows commands to stick out tongue   Pertinent Labs: CBC Latest Ref Rng & Units 02/21/2020 02/17/2020 02/16/2020  WBC 4.0 - 10.5 K/uL 14.8(H) 10.9(H) 11.1(H)  Hemoglobin 13.0 - 17.0 g/dL 10.4(L) 9.2(L) 8.0(L)  Hematocrit 39.0 - 52.0 % 32.7(L) 28.0(L) 25.5(L)  Platelets 150 - 400 K/uL 402(H) 306 246    CMP Latest Ref Rng & Units 02/21/2020 02/17/2020 02/16/2020  Glucose 70 - 99 mg/dL 570(V) 779(T) 903(E)  BUN 6 - 20 mg/dL 09(Q) 16 33(A)  Creatinine 0.61 - 1.24 mg/dL 0.76 2.26 3.33  Sodium 135 - 145 mmol/L 134(L) 138 134(L)  Potassium 3.5 - 5.1 mmol/L 4.2 4.1 3.8  Chloride 98 - 111 mmol/L 101 102 101  CO2 22 - 32 mmol/L 23 27 23   Calcium 8.9 - 10.3 mg/dL 9.8 9.5 8.9  Total Protein 6.5 - 8.1 g/dL - 6.3(L) 5.7(L)  Total Bilirubin 0.3 - 1.2 mg/dL - 0.6 0.3  Alkaline Phos 38 - 126 U/L - 132(H)  119  AST 15 - 41 U/L - 58(H) 55(H)  ALT 0 - 44 U/L - 89(H) 66(H)    Assessment/Plan:   Active Problems:   Schizoaffective disorder (HCC)   MDD (major depressive disorder), recurrent, severe, with psychosis (HCC)   DKA (diabetic ketoacidosis) (HCC)   Pancreatitis   Respiratory failure (HCC)   AKI (acute kidney injury) (HCC)   Dehydration   Toxic metabolic encephalopathy   Pressure injury of skin  Patient Summary: Bryan [name] is a 44 y.o. man with history of schizophrenia who presented to [facility] ED on 01/25/20 with acute encephalopathy and lethargy and was admitted for DKA. He was transferred to the ICU on 11/30 with agitation and hypotension, had subsequent PEA arrest. Found to have acute pancreatitis with unclear etiology though found to have elevated triglycerides. Encephalopathy felt to be secondary to anoxia. Started on empiric antibiotics vancomycin/meropenem on 12/7 due to concern for infectious etiology after developing fever and leukocytosis, however vancomycin discontinued after presumed drug reaction. Started on Precedex. Underwent tracheostomy by ENT on 12/18 due to ventilator dependence. Subsequently spiked another fever on 12/19 with CXR findings suspicious for multifocal pneumonia for which he was started on cefepime and Zyvox with some improvement. Weaned off of Precedex and tolerating trach collar support. Abx switched to IV ceftriaxone for MSSA pneumonia. Care transferred initially to [facility] on  12/22 and then IMT[facility] on 12/23.   This is hospital day 28.  Acute hypoxemic respiratory failure due to multifocal pneumonia S/p trach POD #9 Sepsis due to recurrent multifocal pneumonia, now with MSSA Staph epi bacteremia vs likely contamination Afebrile overnight. Exam stable. Tolerating trach collar. - s/p 7-day course of antibiotics for MSSA pneumonia (Last day: 02/20/20) - Continue clonidine wean, appreciate pharmacy's assistance with this -   -  -  - 0.1 mg BID  starting 12/27 for 1 day - 0.1 mg once on 12/28 - Trach management per ENT - ENT to downsize trach to #6 cuffless today or tomorrow - guaifenesin 100 mg q4h   Acute metabolic encephalopathy S/p PEA cardiac arrest Schizophrenia - Continue Seroquel 100 mg twice daily - NPO, tube feeds as below - Cardiac telemetry  Tachycardia Metoprolol 50 mg twice daily started 12/22. Tachycardia improving with mIVF. Continue mIVF. - mIVF NS @ 100 cc/hr for 12 hours - Continue metoprolol 50 mg twice daily  New diagnosis of diabetes presenting with DKA Blood glucose well-controlled, ranging 100s-150. - Insulin detemir (Levemir) 40 units twice daily - Novolog 8 units q4h - Hypoglycemia protocol in place - Tube feeds  Decubitus ulcer on coccyx, unstageable Nursing note flaking of skin around buttocks. - Wound care consulted, appreciate their recs   Diet: Tube Feeds, NPO IVF: NS @ 100 cc/hr for 12 hours VTE: Enoxaparin Code: Full   Please contact the on call pager after 5 pm and on weekends at [phone].  Alphonzo Severance, MD PGY-1 Internal Medicine Teaching Service Pager: [phone] 02/22/2020

## 2020-02-22 NOTE — Evaluation (Signed)
Physical Therapy Evaluation Patient Details Name: Bryan [name] MRN: [id number] DOB: [birth year] Today's Date: 02/22/2020   History of Present Illness  Pt is a 44 y.o. man with history of schizophrenia who presented to [facility] ED on 01/25/20 with acute encephalopathy and lethargy and was admitted for DKA. He was transferred to the ICU on 11/30 with agitation and hypotension, had subsequent PEA arrest. Found to have acute pancreatitis with unclear etiology though found to have elevated triglycerides. Encephalopathy felt to be secondary to anoxia. Pt is s/p trach on 12/18 due to ventilator dependence. Subsequently spiked another fever on 12/19 with CXR findings suspicious for multifocal pneumonia    Clinical Impression  Pt admitted with above. Per MD pt was indep with mobility PTA. Pt now requiring totalAx2 and is on O2 via trach. Pt able to follow simple commands majority of time.  At this time recommending LTACH until pt's respiratory status improves to participate in CIR. Acute PT to cont to follow.    Follow Up Recommendations LTACH    Equipment Recommendations   (TBD at next venue)    Recommendations for Other Services       Precautions / Restrictions Precautions Precautions: Fall Precaution Comments: NG, flexi Restrictions Weight Bearing Restrictions: No      Mobility  Bed Mobility Overal bed mobility: Needs Assistance Bed Mobility: Supine to Sit;Sit to Supine     Supine to sit: Total assist;+2 for physical assistance;+2 for safety/equipment Sit to supine: Total assist;+2 for physical assistance;+2 for safety/equipment   General bed mobility comments: pt did elevated head to initiate trunk elevation otherwise pt dependent for LE and trunk management    Transfers                 General transfer comment: not attempted today  Ambulation/Gait                Stairs            Wheelchair Mobility    Modified Rankin (Stroke Patients Only)        Balance Overall balance assessment: Needs assistance Sitting-balance support: Feet supported Sitting balance-Leahy Scale: Poor Sitting balance - Comments: majority of time pt dependent ot maintain EOB sitting balance. With max verbal and tactile cues pt would attempt to self correct posture when PT decreased posterior support                                     Pertinent Vitals/Pain Pain Assessment: Faces Faces Pain Scale: Hurts a little bit Pain Location: LUE with certain ROM Pain Descriptors / Indicators: Discomfort Pain Intervention(s): Monitored during session    Home Living Family/patient expects to be discharged to:: Other (Comment) (LTAC)                 Additional Comments: per chart pt was living with brothers PTA, pt currently unable to provide home setup.    Prior Function Level of Independence: Independent         Comments: suspect indep from mobility staindpoint however suspect pt needs assist for IADLs due to psych history     Hand Dominance        Extremity/Trunk Assessment   Upper Extremity Assessment Upper Extremity Assessment: Defer to OT evaluation (noted patient holding L UE in flexed position at below and wrist) RUE Deficits / Details: notable weakness throughout, able to flex/extend digits to command RUE Coordination: decreased fine motor;decreased  gross motor LUE Deficits / Details: notable weakness throughout, able to flex digits to command. increased flexor tone noted in elbow/wrist with ROM attempts LUE Coordination: decreased fine motor;decreased gross motor    Lower Extremity Assessment Lower Extremity Assessment: Generalized weakness RLE Deficits / Details: pt with minimal volutnary movement, increased tone/stiffness LLE Deficits / Details: minimal voluntary movement, increased tone/stiffness    Cervical / Trunk Assessment Cervical / Trunk Assessment: Kyphotic;Other exceptions Cervical / Trunk Exceptions: notable  cervical weakness, requires assist to maintain head upright  Communication   Communication: Expressive difficulties;Tracheostomy  Cognition Arousal/Alertness: Awake/alert Behavior During Therapy: Flat affect Overall Cognitive Status: Impaired/Different from baseline Area of Impairment: Attention;Following commands;Awareness;Problem solving                   Current Attention Level: Focused   Following Commands: Follows one step commands with increased time;Follows one step commands inconsistently   Awareness: Intellectual Problem Solving: Slow processing;Decreased initiation;Requires verbal cues;Requires tactile cues General Comments: pt consistently follow most simple commands with delayed response time. pt attempted to mouthx2 to OT when asked if something hurt or was difficult. Pt attempted to track L/R however difficutly to stay focused      General Comments General comments (skin integrity, edema, etc.): HR inc to 130s while EOB, pt diaphoretic however per MD this is at his baseline currently    Exercises     Assessment/Plan    PT Assessment Patient needs continued PT services  PT Problem List Decreased strength;Decreased activity tolerance;Decreased range of motion;Decreased balance;Decreased mobility;Decreased coordination;Decreased cognition;Decreased knowledge of use of DME;Decreased safety awareness       PT Treatment Interventions DME instruction;Gait training;Stair training;Functional mobility training;Therapeutic activities;Therapeutic exercise;Balance training    PT Goals (Current goals can be found in the Care Plan section)  Acute Rehab PT Goals Patient Stated Goal: unable to state PT Goal Formulation: Patient unable to participate in goal setting Time For Goal Achievement: 03/07/20 Potential to Achieve Goals: Good    Frequency Min 2X/week   Barriers to discharge        Co-evaluation PT/OT/SLP Co-Evaluation/Treatment: Yes Reason for Co-Treatment:  For patient/therapist safety PT goals addressed during session: Mobility/safety with mobility OT goals addressed during session: ADL's and self-care       AM-PAC PT "6 Clicks" Mobility  Outcome Measure Help needed turning from your back to your side while in a flat bed without using bedrails?: Total Help needed moving from lying on your back to sitting on the side of a flat bed without using bedrails?: Total Help needed moving to and from a bed to a chair (including a wheelchair)?: Total Help needed standing up from a chair using your arms (e.g., wheelchair or bedside chair)?: Total Help needed to walk in hospital room?: Total Help needed climbing 3-5 steps with a railing? : Total 6 Click Score: 6    End of Session Equipment Utilized During Treatment: Oxygen (via trach) Activity Tolerance: Patient tolerated treatment well Patient left: in bed;with call bell/phone within reach;with bed alarm set Nurse Communication: Mobility status PT Visit Diagnosis: Unsteadiness on feet (R26.81);Difficulty in walking, not elsewhere classified (R26.2);Muscle weakness (generalized) (M62.81)    Time: 2836-6294 PT Time Calculation (min) (ACUTE ONLY): 23 min   Charges:   PT Evaluation $PT Eval Moderate Complexity: 1 Mod          Lewis Shock, PT, DPT Acute Rehabilitation Services Pager #: [phone] Office #: [phone]   Iona Hansen 02/22/2020, 12:09 PM

## 2020-02-22 NOTE — Progress Notes (Signed)
Dr. Suszanne Conners managing trach-related issues and weaning, PCCM available PRN.

## 2020-02-23 DIAGNOSIS — Z930 Tracheostomy status: Secondary | ICD-10-CM

## 2020-02-23 DIAGNOSIS — R000 Tachycardia, unspecified: Secondary | ICD-10-CM

## 2020-02-23 DIAGNOSIS — L89150 Pressure ulcer of sacral region, unstageable: Secondary | ICD-10-CM

## 2020-02-23 DIAGNOSIS — Z8619 Personal history of other infectious and parasitic diseases: Secondary | ICD-10-CM

## 2020-02-23 DIAGNOSIS — G9341 Metabolic encephalopathy: Secondary | ICD-10-CM

## 2020-02-23 DIAGNOSIS — J189 Pneumonia, unspecified organism: Secondary | ICD-10-CM

## 2020-02-23 DIAGNOSIS — Z8674 Personal history of sudden cardiac arrest: Secondary | ICD-10-CM

## 2020-02-23 LAB — GLUCOSE, CAPILLARY
Glucose-Capillary: 108 mg/dL — ABNORMAL HIGH (ref 70–99)
Glucose-Capillary: 119 mg/dL — ABNORMAL HIGH (ref 70–99)
Glucose-Capillary: 124 mg/dL — ABNORMAL HIGH (ref 70–99)
Glucose-Capillary: 125 mg/dL — ABNORMAL HIGH (ref 70–99)
Glucose-Capillary: 127 mg/dL — ABNORMAL HIGH (ref 70–99)
Glucose-Capillary: 130 mg/dL — ABNORMAL HIGH (ref 70–99)
Glucose-Capillary: 136 mg/dL — ABNORMAL HIGH (ref 70–99)

## 2020-02-23 NOTE — Hospital Course (Addendum)
Bryan [name] is a 44 y.o. male with history of schizophrenia who presented to [facility] ED on 01/25/20 with acute encephalopathy and lethargy and was admitted for DKA. He was transferred to the ICU on 11/30 with agitation and hypotension, had subsequent PEA arrest. Found to have acute pancreatitis with unclear etiology though found to have elevated triglycerides. Encephalopathy felt to be secondary to anoxia. Started on empiric antibiotics vancomycin/meropenem on 12/7 due to concern for infectious etiology after developing fever and leukocytosis, however vancomycin discontinued after presumed drug reaction. Started on Precedex. Underwent tracheostomy by ENT on 12/18 due to ventilator dependence. Subsequently spiked another fever on 12/19 with CXR findings suspicious for multifocal pneumonia for which he was started on cefepime and Zyvox with some improvement. Weaned off of Precedex and tolerated trach collar support. Antibiotics were switched to IV ceftriaxone for MSSA pneumonia and completed course on 12/25. Care transferred initially to [facility] on 12/22 and then to our Internal Medicine Teaching Service on 12/23. His trach was changed to uncuffed #6 Shiley trach tube on 12/28. He was slowly weaned of clonidine over a couple of days. Due to persistent tachycardia, he was initiated on metoprolol which he continued on with improvement by discharge to P 80s-90s. He continued to be tachycardic to the 90s-110s until discharge. Patient made gradual but significant progress physically and mentally. He self-decannulated his trach on 03/03/20 after nearly 48 hrs of a capping trial. The Cortrak through which he received tube feeds through much of his hospitalization was removed on 03/02/20, and he was cleared for a Dysphagia 3 diet which he has done well on. He was discharged to [facility] for further rehab services.

## 2020-02-23 NOTE — Progress Notes (Signed)
Subjective: Resting in bed. Minimally responsive. No trach issues.  Objective: Vital signs in last 24 hours: Temp:  [99 F (37.2 C)-100.2 F (37.9 C)] 100 F (37.8 C) (12/28 0425) Pulse Rate:  [92-118] 106 (12/28 0800) Resp:  [17-27] 17 (12/28 0425) BP: (111-141)/(80-100) 130/80 (12/28 0425) SpO2:  [95 %-98 %] 95 % (12/28 0800) FiO2 (%):  [35 %] 35 % (12/28 0800) Weight:  [99.5 kg] 99.5 kg (12/28 0459)  Physical Exam: General appearance:Ontrach collar. Awake, but minimally responsive. Head:Normocephalic, without obvious abnormality, atraumatic Ears: Examination of the ears shows normal auricles and external auditory canals bilaterally. Nose: Nasal examination shows normal mucosa, septum, turbinates.  Face: Facial examination shows no asymmetry.  Mouth:Normal mucosa. No injury. Neck:Trach tube in place. No bleeding. Neuro:Awake but minimally responsive.   Recent Labs    02/21/20 0306 02/22/20 0719  WBC 14.8* 12.9*  HGB 10.4* 10.4*  HCT 32.7* 32.5*  PLT 402* 411*   Recent Labs    02/21/20 0306  NA 134*  K 4.2  CL 101  CO2 23  GLUCOSE 146*  BUN 21*  CREATININE 0.80  CALCIUM 9.8    Medications:  I have reviewed the patient's current medications. Scheduled: . chlorhexidine gluconate (MEDLINE KIT)  15 mL Mouth Rinse BID  . Chlorhexidine Gluconate Cloth  6 each Topical Daily  . docusate  100 mg Per Tube BID  . enoxaparin (LOVENOX) injection  40 mg Subcutaneous Q24H  . feeding supplement (PROSource TF)  45 mL Per Tube 5 X Daily  . Gerhardt's butt cream   Topical BID  . guaiFENesin  5 mL Per Tube Q4H  . insulin aspart  0-15 Units Subcutaneous Q4H  . insulin aspart  8 Units Subcutaneous Q4H  . insulin detemir  40 Units Subcutaneous BID  . mouth rinse  15 mL Mouth Rinse 10 times per day  . metoprolol tartrate  50 mg Per Tube BID  . nutrition supplement (JUVEN)  1 packet Per Tube BID BM  . pantoprazole sodium  40 mg Per Tube Daily  . QUEtiapine  100 mg  Per Tube q morning - 10a   And  . QUEtiapine  100 mg Per Tube QHS   Continuous: . sodium chloride 250 mL (02/08/20 1825)  . feeding supplement (OSMOLITE 1.5 CAL) 60 mL/hr at 02/23/20 0539    Assessment/Plan: POD #10s/p trach. On trach collar. - Trach downsized to a cuffless #6 tube. Stoma healing well. - Routine trach care. - Will sign off.   LOS: 29 days   Gracie Gupta W Charlize Hathaway 02/23/2020, 9:42 AM

## 2020-02-23 NOTE — Progress Notes (Signed)
Subjective:   Hospital day: 29  Overnight event: NAEOV  This AM, patient was evaluated laying comfortably in bed. He was able to follow a few commands. He denied any pain.  Objective:  Vital signs in last 24 hours: Vitals:   02/23/20 0001 02/23/20 0326 02/23/20 0425 02/23/20 0459  BP: 120/80  130/80   Pulse: 100 96 99   Resp: (!) 22 17 17    Temp: 100.2 F (37.9 C)  100 F (37.8 C)   TempSrc: Axillary  Axillary   SpO2: 97% 96% 95%   Weight:    99.5 kg  Height:        Physical Exam  General: Ill-appearing middle-aged man laying in bed. No acute distress. HEENT: On trach collar. Stoma healing well. [place]/AT. Spontaneously opens eyes.  CV: RRR. No murmurs, rubs, or gallops. No LE edema Pulmonary: Lungs CTAB. Coarse breath sounds. Normal effort. No wheezing or rales. Abdominal: Soft, nontender, nondistended. Normal bowel sounds. Extremities: Palpable pulses. Normal ROM. Skin: Warm and dry. No obvious rash or lesions. Neuro: Awake. Able to respond with one word answers. Moves all extremities spontaneously. Normal sensation.    Assessment/Plan: Bryan [name] is a 44 y.o. male with history of schizophrenia who presented to [facility] ED on 01/25/20 with acute encephalopathy and lethargy and was admitted for DKA. He was transferred to the ICU on 11/30 with agitation and hypotension, had subsequent PEA arrest. Found to have acute pancreatitis with unclear etiology though found to have elevated triglycerides. Encephalopathy felt to be secondary to anoxia. Started on empiric antibiotics vancomycin/meropenem on 12/7 due to concern for infectious etiology after developing fever and leukocytosis, however vancomycin discontinued after presumed drug reaction. Started on Precedex. Underwent tracheostomy by ENT on 12/18 due to ventilator dependence. Subsequently spiked another fever on 12/19 with CXR findings suspicious for multifocal pneumonia for which he was started on cefepime and Zyvox with some  improvement. Weaned off of Precedex and tolerating trach collar support. Abx switched to IV ceftriaxone for MSSA pneumonia and completed course on 12/25. Care transferred initially to [facility] on 12/22 and then I[facility] on 12/23. Pending transfer to LTAC.   Active Problems:   Schizoaffective disorder (HCC)   MDD (major depressive disorder), recurrent, severe, with psychosis (HCC)   DKA (diabetic ketoacidosis) (HCC)   Pancreatitis   Respiratory failure (HCC)   AKI (acute kidney injury) (HCC)   Dehydration   Toxic metabolic encephalopathy   Pressure injury of skin   This is hospital day 29.  Acute hypoxemic respiratory failure due to multifocal pneumonia S/p trach POD #10 S/p treatment for Sepsis/MSSA bacteremia Patient remained afebrile overnight. S/p 7 days of antibiotics for MSSA pneumonia (last day: 12/25). Making slow progress.  --Weaning off clonidine with pharmacy assistance.      ---0.1 mg once today --ENT signed off today   --Changed to uncuffed #6 Shiley trach tube today   --Routine trach care   --Continue guaifenesin 100 mg q4h  Acute metabolic encephalopathy S/p PEA cardiac arrest Schizophrenia Patient remains on tube feeds. Minimally responsive on exam. Patient will need LTAC.  --Continue Seroquel 100 mg BID --Tele  Tachycardia Improved. HR in the high 90s overnight after IVF.  --Continue Metoprolol 50 mg BID --Daily vitals  New diagnosis of diabetes presenting with DKA Well-controlled. CBG 110s-130s over the last 24 hrs.  --Continue Levemir 40 u BID --Continue Novolog 8u q4h --Continue tube feeds  Decubitus ulcer on coccyx, unstageable Nursing note flaking of skin around buttocks. Ulcer improving.  --  Wound care consulted, appreciate recs.  Diet: Tube feeds IVF: IVNS VTE: Lovenox CODE: Full code  Prior to Admission Living Arrangement: Home Anticipated Discharge Location: LTAC Barriers to Discharge: Medical work up Dispo: Anticipated  discharge in approximately 1-2 day(s).   Signed: Steffanie Rainwater, MD 02/23/2020, 6:50 AM  Pager: [phone] Internal Medicine Teaching Service After 5pm on weekdays and 1pm on weekends: On Call pager: [phone]

## 2020-02-23 NOTE — Plan of Care (Signed)
Patient having a large amount of thick tan secretions. DTI to left heal, mepilex dressings to be applied to bilateral heals; MD aware and to order air mattress.  Problem: Education: Goal: Knowledge of General Education information will improve Description: Including pain rating scale, medication(s)/side effects and non-pharmacologic comfort measures Outcome: Progressing   Problem: Health Behavior/Discharge Planning: Goal: Ability to manage health-related needs will improve Outcome: Progressing   Problem: Clinical Measurements: Goal: Ability to maintain clinical measurements within normal limits will improve Outcome: Progressing Goal: Will remain free from infection Outcome: Progressing Goal: Diagnostic test results will improve Outcome: Progressing Goal: Respiratory complications will improve Outcome: Progressing Goal: Cardiovascular complication will be avoided Outcome: Progressing   Problem: Activity: Goal: Risk for activity intolerance will decrease Outcome: Progressing   Problem: Nutrition: Goal: Adequate nutrition will be maintained Outcome: Progressing   Problem: Coping: Goal: Level of anxiety will decrease Outcome: Progressing   Problem: Elimination: Goal: Will not experience complications related to bowel motility Outcome: Progressing Goal: Will not experience complications related to urinary retention Outcome: Progressing   Problem: Pain Managment: Goal: General experience of comfort will improve Outcome: Progressing   Problem: Safety: Goal: Ability to remain free from injury will improve Outcome: Progressing   Problem: Skin Integrity: Goal: Risk for impaired skin integrity will decrease Outcome: Progressing   Problem: Education: Goal: Ability to describe self-care measures that may prevent or decrease complications (Diabetes Survival Skills Education) will improve Outcome: Progressing Goal: Individualized Educational Video(s) Outcome: Progressing    Problem: Cardiac: Goal: Ability to maintain an adequate cardiac output will improve Outcome: Progressing   Problem: Health Behavior/Discharge Planning: Goal: Ability to identify and utilize available resources and services will improve Outcome: Progressing Goal: Ability to manage health-related needs will improve Outcome: Progressing   Problem: Fluid Volume: Goal: Ability to achieve a balanced intake and output will improve Outcome: Progressing   Problem: Metabolic: Goal: Ability to maintain appropriate glucose levels will improve Outcome: Progressing   Problem: Nutritional: Goal: Maintenance of adequate nutrition will improve Outcome: Progressing Goal: Maintenance of adequate weight for body size and type will improve Outcome: Progressing   Problem: Respiratory: Goal: Will regain and/or maintain adequate ventilation Outcome: Progressing   Problem: Urinary Elimination: Goal: Ability to achieve and maintain adequate renal perfusion and functioning will improve Outcome: Progressing   Problem: Role Relationship: Goal: Method of communication will improve Outcome: Progressing

## 2020-02-23 NOTE — Progress Notes (Signed)
Trach changed from a cuffed #8 Shiley to a cuffless #6 Shiley by Dr. Suszanne Conners. Pt tolerated well, RT will continue to monitor and be available as needed.

## 2020-02-23 NOTE — Progress Notes (Signed)
Order for specialty air bed placed today; portable equipment does not have any available at this time but have added the patient to the list.

## 2020-02-24 LAB — GLUCOSE, CAPILLARY
Glucose-Capillary: 113 mg/dL — ABNORMAL HIGH (ref 70–99)
Glucose-Capillary: 151 mg/dL — ABNORMAL HIGH (ref 70–99)
Glucose-Capillary: 164 mg/dL — ABNORMAL HIGH (ref 70–99)
Glucose-Capillary: 157 mg/dL — ABNORMAL HIGH (ref 70–99)
Glucose-Capillary: 162 mg/dL — ABNORMAL HIGH (ref 70–99)
Glucose-Capillary: 134 mg/dL — ABNORMAL HIGH (ref 70–99)
Glucose-Capillary: 147 mg/dL — ABNORMAL HIGH (ref 70–99)

## 2020-02-24 LAB — CBC WITH DIFFERENTIAL/PLATELET
Abs Immature Granulocytes: 0.08 10*3/uL — ABNORMAL HIGH (ref 0.00–0.07)
Basophils Absolute: 0.1 10*3/uL (ref 0.0–0.1)
Basophils Relative: 0 %
Eosinophils Absolute: 0.1 10*3/uL (ref 0.0–0.5)
Eosinophils Relative: 1 %
HCT: 33.4 % — ABNORMAL LOW (ref 39.0–52.0)
Hemoglobin: 11.1 g/dL — ABNORMAL LOW (ref 13.0–17.0)
Immature Granulocytes: 1 %
Lymphocytes Relative: 16 %
Lymphs Abs: 1.9 10*3/uL (ref 0.7–4.0)
MCH: 30.8 pg (ref 26.0–34.0)
MCHC: 33.2 g/dL (ref 30.0–36.0)
MCV: 92.8 fL (ref 80.0–100.0)
Monocytes Absolute: 1 10*3/uL (ref 0.1–1.0)
Monocytes Relative: 8 %
Neutro Abs: 8.5 10*3/uL — ABNORMAL HIGH (ref 1.7–7.7)
Neutrophils Relative %: 74 %
Platelets: 455 10*3/uL — ABNORMAL HIGH (ref 150–400)
RBC: 3.6 MIL/uL — ABNORMAL LOW (ref 4.22–5.81)
RDW: 14.2 % (ref 11.5–15.5)
WBC: 11.6 10*3/uL — ABNORMAL HIGH (ref 4.0–10.5)
nRBC: 0 % (ref 0.0–0.2)

## 2020-02-24 LAB — BASIC METABOLIC PANEL
Anion gap: 10 (ref 5–15)
BUN: 20 mg/dL (ref 6–20)
CO2: 27 mmol/L (ref 22–32)
Calcium: 10.1 mg/dL (ref 8.9–10.3)
Chloride: 97 mmol/L — ABNORMAL LOW (ref 98–111)
Creatinine, Ser: 0.77 mg/dL (ref 0.61–1.24)
GFR, Estimated: >60 mL/min (ref >60)
Glucose, Bld: 165 mg/dL — ABNORMAL HIGH (ref 70–99)
Potassium: 4.4 mmol/L (ref 3.5–5.1)
Sodium: 134 mmol/L — ABNORMAL LOW (ref 135–145)

## 2020-02-24 NOTE — Plan of Care (Signed)
  Problem: Education: Goal: Knowledge of General Education information will improve Description: Including pain rating scale, medication(s)/side effects and non-pharmacologic comfort measures Outcome: Progressing   Problem: Health Behavior/Discharge Planning: Goal: Ability to manage health-related needs will improve Outcome: Progressing   Problem: Clinical Measurements: Goal: Ability to maintain clinical measurements within normal limits will improve Outcome: Progressing Goal: Will remain free from infection Outcome: Progressing Goal: Diagnostic test results will improve Outcome: Progressing Goal: Respiratory complications will improve Outcome: Progressing Goal: Cardiovascular complication will be avoided Outcome: Progressing   Problem: Activity: Goal: Risk for activity intolerance will decrease Outcome: Progressing   Problem: Nutrition: Goal: Adequate nutrition will be maintained Outcome: Progressing   Problem: Coping: Goal: Level of anxiety will decrease Outcome: Progressing   Problem: Elimination: Goal: Will not experience complications related to bowel motility Outcome: Progressing Goal: Will not experience complications related to urinary retention Outcome: Progressing   Problem: Pain Managment: Goal: General experience of comfort will improve Outcome: Progressing   Problem: Safety: Goal: Ability to remain free from injury will improve Outcome: Progressing   Problem: Skin Integrity: Goal: Risk for impaired skin integrity will decrease Outcome: Progressing   Problem: Education: Goal: Ability to describe self-care measures that may prevent or decrease complications (Diabetes Survival Skills Education) will improve Outcome: Progressing Goal: Individualized Educational Video(s) Outcome: Progressing   Problem: Cardiac: Goal: Ability to maintain an adequate cardiac output will improve Outcome: Progressing   Problem: Health Behavior/Discharge  Planning: Goal: Ability to identify and utilize available resources and services will improve Outcome: Progressing Goal: Ability to manage health-related needs will improve Outcome: Progressing   Problem: Fluid Volume: Goal: Ability to achieve a balanced intake and output will improve Outcome: Progressing   Problem: Metabolic: Goal: Ability to maintain appropriate glucose levels will improve Outcome: Progressing   Problem: Nutritional: Goal: Maintenance of adequate nutrition will improve Outcome: Progressing Goal: Maintenance of adequate weight for body size and type will improve Outcome: Progressing   Problem: Respiratory: Goal: Will regain and/or maintain adequate ventilation Outcome: Progressing   Problem: Urinary Elimination: Goal: Ability to achieve and maintain adequate renal perfusion and functioning will improve Outcome: Progressing   Problem: Role Relationship: Goal: Method of communication will improve Outcome: Progressing   

## 2020-02-24 NOTE — Progress Notes (Signed)
Subjective:   Hospital day: 30  Overnight event: NAEOV  This AM, patient was evaluated at the bedside laying in bed. Patient looked a little diaphoretic and tachycardic but he denied having any pain. RN informed team that patient had not received his metoprolol yet and this could be the reason why HR is high.   Objective:  Vital signs in last 24 hours: Vitals:   02/24/20 0800 02/24/20 0919 02/24/20 1000 02/24/20 1136  BP: (!) 142/100 (!) 142/100 (!) 126/94 120/84  Pulse: (!) 112 (!) 117 (!) 114 98  Resp: 17 19 20 18   Temp: 99.2 F (37.3 C)  98.4 F (36.9 C) 98.9 F (37.2 C)  TempSrc: Axillary  Oral Axillary  SpO2: 97% 93% 96% 96%  Weight:      Height:        Physical Exam  General: Chronically-ill appearing man on trach. Minimally responsive. HEENT: On trach collar. [place]/AT CV: Tachycardia. Regular rhythm. No m/r/g. No LE edema. Pulmonary: Lungs CTAB. No wheezing or rales.  Abdominal: Soft. NT/ND. Normal BS. Extremities: Palpable pulses. Feet in pressure pads. Skin: Diaphoretic. Skin sloughing over bilateral hands Neuro: Awake. Opens eyes spontaneously. Able to follow commands. Moves all extremities. Normal sensation.   Assessment/Plan: Bryan [name] is a 44 y.o. male with history of schizophrenia who presented to [facility] ED on 01/25/20 with acute encephalopathy and lethargy and was admitted for DKA. He was transferred to the ICU on 11/30 with agitation and hypotension, had subsequent PEA arrest. Found to have acute pancreatitis with unclear etiology though found to have elevated triglycerides. Encephalopathy felt to be secondary to anoxia. Started on empiric antibiotics vancomycin/meropenem on 12/7 due to concern for infectious etiology after developing fever and leukocytosis, however vancomycin discontinued after presumed drug reaction. Started on Precedex. Underwent tracheostomy by ENT on 12/18 due to ventilator dependence. Subsequently spiked another fever on 12/19 with CXR  findings suspicious for multifocal pneumonia for which he was started on cefepime and Zyvox with some improvement. Weaned off of Precedex and tolerating trach collar support. Abx switched to IV ceftriaxone for MSSA pneumonia and completed course on 12/25. [facility] to [facility] on 12/22 and then IMT[facility] on 12/23. Pending SNF placement.   Active Problems:   Schizoaffective disorder (HCC)   MDD (major depressive disorder), recurrent, severe, with psychosis (HCC)   DKA (diabetic ketoacidosis) (HCC)   Pancreatitis   Respiratory failure (HCC)   AKI (acute kidney injury) (HCC)   Dehydration   Toxic metabolic encephalopathy   Pressure injury of skin  This is hospital day 30.  Acute hypoxemic respiratory failure due to multifocal pneumonia S/p trach POD #11 S/p treatment for Sepsis/MSSA bacteremia Continues to be afebrile. White count trending down. Weaned off clonidine yesterday. 1/24 is now uncuffed. Mental status unchanged. Opens eyes and follows command. Will continue to watch closely.  --Pending SNF placement --Routine trach care --Continue guaifenesin 100 mg q4h  Acute metabolic encephalopathy S/p PEA cardiac arrest Schizophrenia Remains on tube feeds. No significant change in mental status. Pending SNF placement. --Continue Seroquel 100 mg BID  Tachycardia Improved. HR in the 90s-100s --Continue Metoprolol 50 mg BID --Tele and daily vitals  New diagnosis of diabetes presenting with DKA Blood sugar stable. CBG of 110s to 130s in the last 48 hrs. --Continue Levemir 40u BID --Continue Novolog 8u q4h --Continue tube feeds.    Decubitus ulcer on coccyx, unstageable Ulcer improving.  --Continue wound care.   Diet: Tube feeds IVF: IVNS VTE: Lovenox CODE: Full  code  Prior to Admission Living Arrangement: Home Anticipated Discharge Location: SNF Barriers to Discharge: SNF placement Dispo: Anticipated discharge in approximately 1-2 day(s).    Signed: Steffanie Rainwater, MD 02/24/2020, 7:14 AM  Pager: [phone] Internal Medicine Teaching Service After 5pm on weekdays and 1pm on weekends: On Call pager: [phone]

## 2020-02-24 NOTE — Progress Notes (Signed)
Nutrition Follow-up  DOCUMENTATION CODES:   Not applicable  INTERVENTION:  Continue Osmolite 1.5 cal via Cortrak NGT at goal rate of 60 ml/hr.   Continue 45 ml Prosource TF given 5 times daily per tube.   Tube feeding to provide 2360 kcal, 145 grams of protein, and 1094 ml water.   Continue Juven BID, each packet provides 95 calories, 2.5 grams of protein to promote wound healing.  NUTRITION DIAGNOSIS:   Increased nutrient needs related to acute illness,wound healing as evidenced by estimated needs; ongoing  GOAL:   Patient will meet greater than or equal to 90% of their needs; met with TF  MONITOR:   TF tolerance,Labs,Skin  REASON FOR ASSESSMENT:   Consult Enteral/tube feeding initiation and management (trickle tube feeds)  ASSESSMENT:   44 yo male admitted with AMS, new onset DM with DKA. PMH includes MDD, paranoid schizophrenia, HTN, smoker, drug use (cocaine, marijuana), heavy alcohol use.  S/P Cortrak placement 12/17, tip in the stomach. S/P tracheostomy 12/18. On trach collar since 12/20.  Pt has been tolerating his tube feeds via Cortrak NGT. RD to continue with current tube feeding orders. SNF placement pending.   Labs and medications reviewed.   Diet Order:   Diet Order            Diet NPO time specified  Diet effective midnight                 EDUCATION NEEDS:   Not appropriate for education at this time  Skin:  Skin Assessment: Reviewed RN Assessment Skin Integrity Issues:: Stage II,Other (Comment) Stage II: coccyx (12/17) Other: MASD and skin tear to buttocks  Last BM:  12/28 rectal tube 400 ml output  Height:   Ht Readings from Last 1 Encounters:  01/25/20 _0  (1.778 m)    Weight:   Wt Readings from Last 1 Encounters:  02/24/20 97.2 kg   BMI:  Body mass index is 30.75 kg/m.  Estimated Nutritional Needs:   Kcal:  2200-2400  Protein:  130-150 gm  Fluid:  2.5 L  Corrin Parker, MS, RD, LDN RD pager number/after  hours weekend pager number on Amion.

## 2020-02-25 LAB — GLUCOSE, CAPILLARY
Glucose-Capillary: 125 mg/dL — ABNORMAL HIGH (ref 70–99)
Glucose-Capillary: 148 mg/dL — ABNORMAL HIGH (ref 70–99)
Glucose-Capillary: 178 mg/dL — ABNORMAL HIGH (ref 70–99)
Glucose-Capillary: 184 mg/dL — ABNORMAL HIGH (ref 70–99)
Glucose-Capillary: 107 mg/dL — ABNORMAL HIGH (ref 70–99)
Glucose-Capillary: 160 mg/dL — ABNORMAL HIGH (ref 70–99)

## 2020-02-25 LAB — TRIGLYCERIDES: Triglycerides: 359 mg/dL — ABNORMAL HIGH (ref <150)

## 2020-02-25 MED ORDER — LACTATED RINGERS IV SOLN: INTRAVENOUS | Status: AC

## 2020-02-25 NOTE — Plan of Care (Signed)
  Problem: Education: Goal: Knowledge of General Education information will improve Description: Including pain rating scale, medication(s)/side effects and non-pharmacologic comfort measures Outcome: Progressing   Problem: Health Behavior/Discharge Planning: Goal: Ability to manage health-related needs will improve Outcome: Progressing   Problem: Clinical Measurements: Goal: Ability to maintain clinical measurements within normal limits will improve Outcome: Progressing Goal: Will remain free from infection Outcome: Progressing Goal: Diagnostic test results will improve Outcome: Progressing Goal: Respiratory complications will improve Outcome: Progressing Goal: Cardiovascular complication will be avoided Outcome: Progressing   Problem: Activity: Goal: Risk for activity intolerance will decrease Outcome: Progressing   Problem: Nutrition: Goal: Adequate nutrition will be maintained Outcome: Progressing   Problem: Coping: Goal: Level of anxiety will decrease Outcome: Progressing   Problem: Elimination: Goal: Will not experience complications related to bowel motility Outcome: Progressing Goal: Will not experience complications related to urinary retention Outcome: Progressing   Problem: Pain Managment: Goal: General experience of comfort will improve Outcome: Progressing   Problem: Safety: Goal: Ability to remain free from injury will improve Outcome: Progressing   Problem: Skin Integrity: Goal: Risk for impaired skin integrity will decrease Outcome: Progressing   Problem: Education: Goal: Ability to describe self-care measures that may prevent or decrease complications (Diabetes Survival Skills Education) will improve Outcome: Progressing Goal: Individualized Educational Video(s) Outcome: Progressing   Problem: Cardiac: Goal: Ability to maintain an adequate cardiac output will improve Outcome: Progressing   Problem: Health Behavior/Discharge  Planning: Goal: Ability to identify and utilize available resources and services will improve Outcome: Progressing Goal: Ability to manage health-related needs will improve Outcome: Progressing   Problem: Fluid Volume: Goal: Ability to achieve a balanced intake and output will improve Outcome: Progressing   Problem: Metabolic: Goal: Ability to maintain appropriate glucose levels will improve Outcome: Progressing   Problem: Nutritional: Goal: Maintenance of adequate nutrition will improve Outcome: Progressing Goal: Maintenance of adequate weight for body size and type will improve Outcome: Progressing   Problem: Respiratory: Goal: Will regain and/or maintain adequate ventilation Outcome: Progressing   Problem: Urinary Elimination: Goal: Ability to achieve and maintain adequate renal perfusion and functioning will improve Outcome: Progressing   Problem: Role Relationship: Goal: Method of communication will improve Outcome: Progressing

## 2020-02-25 NOTE — Progress Notes (Signed)
Physical Therapy Treatment Patient Details Name: Bryan [name] MRN: [id number] DOB: Mar 22, 1975 Today's Date: 02/25/2020    History of Present Illness Pt is a 44 y.o. man with history of schizophrenia who presented to [facility] ED on 01/25/20 with acute encephalopathy and lethargy and was admitted for DKA. He was transferred to the ICU on 11/30 with agitation and hypotension, had subsequent PEA arrest. Found to have acute pancreatitis with unclear etiology though found to have elevated triglycerides. Encephalopathy felt to be secondary to anoxia. Pt is s/p trach on 12/18 due to ventilator dependence. Subsequently spiked another fever on 12/19 with CXR findings suspicious for multifocal pneumonia    PT Comments    The pt was willing to participate in session with focus on muscle activation, ROM, and strengthening, as well as pt ability to reposition and mobilize in bed. The pt was able to complete a series of exercises for ROM and strength of BUE and BLE, but will continue to benefit from further mobility, strengthening, and stretching to allow pt to reach full ROM with control. The pt was then able to attempt sitting in bed, but requires totalA to raise trunk from elevated HOB and significant assist to maintain that position. The pt reports significant fatigue following these bed-level exercises, and was unable to progress to full seated position. The pt will continue to benefit from skilled PT to progress functional strength and activity tolerance to allow for decreased caregiver burden and increased pt independence.    Follow Up Recommendations  LTACH     Equipment Recommendations   (defer to post acute)    Recommendations for Other Services       Precautions / Restrictions Precautions Precautions: Fall Precaution Comments: NG, flexi Restrictions Weight Bearing Restrictions: No    Mobility  Bed Mobility Overal bed mobility: Needs Assistance Bed Mobility: Supine to Sit     Supine to  sit: Total assist;+2 for physical assistance;+2 for safety/equipment;HOB elevated     General bed mobility comments: pt able to pull forward with RUE on bed rail to raise trunk from [facility], but does require totalA to raise trunk and reposition.  Transfers                 General transfer comment: not attempted today      Balance Overall balance assessment: Needs assistance Sitting-balance support:  (trun supported) Sitting balance-Leahy Scale: Poor Sitting balance - Comments: pt able to initiate lean forward, but requires significant assis tot maintain upright without trunk support                                    Cognition Arousal/Alertness: Awake/alert Behavior During Therapy: Flat affect Overall Cognitive Status: Impaired/Different from baseline Area of Impairment: Attention;Following commands;Awareness;Problem solving                   Current Attention Level: Focused   Following Commands: Follows one step commands with increased time;Follows one step commands inconsistently   Awareness: Intellectual Problem Solving: Slow processing;Decreased initiation;Requires verbal cues;Requires tactile cues General Comments: Pt consistently follows most simple commands with 3-5 seconds of processing time. occasionally increased cues or repeated commands. Pt able to answer yes/no verbally when given time, verbalized some needs through session      Exercises General Exercises - Upper Extremity Shoulder Flexion: AAROM;Both;10 reps;Seated Elbow Flexion: AAROM;Both;10 reps;Seated Elbow Extension: AAROM;Both;10 reps;Supine Digit Composite Flexion: AROM;Both;10 reps Composite Extension: AROM;Both;10 reps  General Exercises - Lower Extremity Ankle Circles/Pumps: AROM;Both;10 reps;Supine Short Arc Quad: AROM;Both;10 reps;Supine Heel Slides: AAROM;Both;10 reps;Supine    General Comments General comments (skin integrity, edema, etc.): VSS on trach collar       Pertinent Vitals/Pain Pain Assessment: Faces Faces Pain Scale: Hurts a little bit Pain Location: LUE with certain ROM Pain Descriptors / Indicators: Discomfort;Grimacing Pain Intervention(s): Limited activity within patient's tolerance;Monitored during session           PT Goals (current goals can now be found in the care plan section) Acute Rehab PT Goals Patient Stated Goal: unable to state PT Goal Formulation: Patient unable to participate in goal setting Time For Goal Achievement: 03/07/20 Potential to Achieve Goals: Good Progress towards PT goals: Progressing toward goals    Frequency    Min 2X/week      PT Plan Current plan remains appropriate       AM-PAC PT "6 Clicks" Mobility   Outcome Measure  Help needed turning from your back to your side while in a flat bed without using bedrails?: Total Help needed moving from lying on your back to sitting on the side of a flat bed without using bedrails?: Total Help needed moving to and from a bed to a chair (including a wheelchair)?: Total Help needed standing up from a chair using your arms (e.g., wheelchair or bedside chair)?: Total Help needed to walk in hospital room?: Total Help needed climbing 3-5 steps with a railing? : Total 6 Click Score: 6    End of Session Equipment Utilized During Treatment: Oxygen (trach collar) Activity Tolerance: Patient tolerated treatment well;Patient limited by fatigue Patient left: in bed;with call bell/phone within reach;with bed alarm set Nurse Communication: Mobility status PT Visit Diagnosis: Unsteadiness on feet (R26.81);Difficulty in walking, not elsewhere classified (R26.2);Muscle weakness (generalized) (M62.81)     Time: 4081-4481 PT Time Calculation (min) (ACUTE ONLY): 25 min  Charges:  $Therapeutic Exercise: 8-22 mins $Therapeutic Activity: 8-22 mins                     Rolm Baptise, PT, DPT   Acute Rehabilitation Department Pager #: [phone]   Gaetana Michaelis 02/25/2020, 1:30 PM

## 2020-02-25 NOTE — Progress Notes (Signed)
Subjective:   Hospital day: 31  Overnight event: NAEOV  This AM, patient was assessed at bedside laying comfortably in bed. Patient was more response today. He denied any pain or SOB. RN expressed that patient ask to eat a few days ago. Patient was informed that we will have SLP come by to evaluate him to make sure it is safe for him to eat.   Objective:  Vital signs in last 24 hours: Vitals:   02/25/20 1000 02/25/20 1110 02/25/20 1147 02/25/20 1200  BP: (!) 147/100  (!) 118/92 (!) 122/94  Pulse: (!) 116 99 95 (!) 101  Resp: (!) 21 17 18 16   Temp:   99 F (37.2 C)   TempSrc:   Axillary   SpO2: 93% 98% 99% 98%  Weight:      Height:        Physical Exam  General: Chronically-ill appearing man laying comfortably in bed. NAD HEENT: On uncuffed trach collar. Dry lips. [place]/AT CV: Tachycardia. Regular rhythm. No m/r/g. No LE edema Pulmonary: Mild congestion on auscultation of anterior chest wall. No wheezes or rales. Normal effort Extremities: Feet in pressure pads. Palpable pulses. Mildly contracted left hand.  Skin: Warm and dry. Skin sloughing over bilateral hands. Neuro: More engaged today. Able to speak in short sentences. Follows commands appropriately. Moves all extremities.    Assessment/Plan: Bryan [name] is a 44 y.o. male with history of schizophrenia who presented to [facility] ED on 01/25/20 with acute encephalopathy and lethargy and was admitted for DKA. He was transferred to the ICU on 11/30 with agitation and hypotension, had subsequent PEA arrest. Found to have acute pancreatitis with unclear etiology though found to have elevated triglycerides. Encephalopathy felt to be secondary to anoxia. Started on empiric antibiotics vancomycin/meropenem on 12/7 due to concern for infectious etiology after developing fever and leukocytosis, however vancomycin discontinued after presumed drug reaction. Started on Precedex. Underwent tracheostomy by ENT on 12/18 due to ventilator  dependence. Subsequently spiked another fever on 12/19 with CXR findings suspicious for multifocal pneumonia for which he was started on cefepime and Zyvox with some improvement. Weaned off of Precedex and tolerating trach collar support. Abx switched to IV ceftriaxone for MSSA pneumonia and completed course on 12/25. [facility] initially to [facility] on 12/22 and then IMTS on 12/23. Pending SNF placement.   Active Problems:   Schizoaffective disorder (HCC)   MDD (major depressive disorder), recurrent, severe, with psychosis (HCC)   DKA (diabetic ketoacidosis) (HCC)   Pancreatitis   Respiratory failure (HCC)   AKI (acute kidney injury) (HCC)   Dehydration   Toxic metabolic encephalopathy   Pressure injury of skin  This is hospital day 31.  Acute hypoxemic respiratory failure due to multifocal pneumonia S/p trach POD #12 S/p treatment for Sepsis/MSSA bacteremia Patient remains afebrile. Leukocytosis trending down. Mental status improved this morning. Patient able to speak in short sentences. --Pending LTAC placement --Continue routine trach care --Continue guaifenesin 100 mg q4h  Acute metabolic encephalopathy S/p PEA cardiac arrest Schizophrenia Patient remains on tube feeds.  Informed nurse he wanted to eat. Getting a swallow screen to assess for aspiration risk. Will transition to oral feeds as tolerated.  --Pending swallow screen --Continue Seroquel 100 mg twice daily  Tachycardia Heart rate still in the 90s-100s. No signs of infection at the moment. Will continue to monitor. Will give some fluid as patient likely losing fluid from insensible losses.   --Continue Metoprolol 50 mg BID --Start IVNS @ 10-20 mL/hr  New diagnosis of diabetes presenting with DKA Blood sugar slightly trending up. CBG of 120s-170s in the last 24 hrs. Will modify insulin regimen tomorrow if CBG continues to increase overnight.  --Continue Levemir 40u BID --Continue Novolog 8u  q4h --Continue Tube feeds  Decubitus ulcer on coccyx, unstageable Improving. Pressured pads applied to buttocks --Continue wound care  Diet: Tube feeds IVF: IVNS VTE: Lovenox CODE: Full code  Prior to Admission Living Arrangement: Home Anticipated Discharge Location: SNF Barriers to Discharge: SNF placement Dispo: Anticipated discharge in approximately 1-2 day(s).   Signed: Steffanie Rainwater, MD 02/25/2020, 6:15 AM  Pager: [phone] Internal Medicine Teaching Service After 5pm on weekdays and 1pm on weekends: On Call pager: [phone]

## 2020-02-26 LAB — GLUCOSE, CAPILLARY
Glucose-Capillary: 115 mg/dL — ABNORMAL HIGH (ref 70–99)
Glucose-Capillary: 142 mg/dL — ABNORMAL HIGH (ref 70–99)
Glucose-Capillary: 143 mg/dL — ABNORMAL HIGH (ref 70–99)
Glucose-Capillary: 149 mg/dL — ABNORMAL HIGH (ref 70–99)
Glucose-Capillary: 161 mg/dL — ABNORMAL HIGH (ref 70–99)
Glucose-Capillary: 162 mg/dL — ABNORMAL HIGH (ref 70–99)
Glucose-Capillary: 163 mg/dL — ABNORMAL HIGH (ref 70–99)

## 2020-02-26 NOTE — Progress Notes (Signed)
Subjective:   Hospital day: 32  Overnight event: NAEOV  This AM, patient was evaluated at the bedside laying comfortably in bed. When asked if he was in pain, he shook his head. He responded "no" to SOB or chest pain.   Objective:  Vital signs in last 24 hours: Vitals:   02/25/20 1948 02/26/20 0000 02/26/20 0146 02/26/20 0400  BP:  (!) 126/93 (!) 126/93 (!) 119/96  Pulse:  (!) 107 (!) 102 98  Resp:  18 19 19   Temp: 97.9 F (36.6 C) 97.9 F (36.6 C)  98.8 F (37.1 C)  TempSrc:  Axillary  Axillary  SpO2:  98%  94%  Weight:      Height:        Physical Exam  General: Chronically-ill appearing many laying in bed. NAD HEENT: Uncuffed trach collar in place. Dry lips. [place]/AT CV: Tachycardia. Regular rhythm. No m/r/g. No LE edema Pulmonary: Mild congestion but moving air appropriately. No wheezes or rales. Extremities: Feet in pressure pads. Left hand mildly contracted. Skin: Warm and dry. Skin continues to slough over bilateral hands. Neuro: Somnolent but able to follow commands. Answers questions by mumbling or shaking head. Moves all extremities.   Assessment/Plan: Bryan [name] is a 44 y.o. male with history of schizophrenia who presented to [facility] ED on 01/25/20 with acute encephalopathy and lethargy and was admitted for DKA. He was transferred to the ICU on 11/30 with agitation and hypotension, had subsequent PEA arrest. Found to have acute pancreatitis with unclear etiology though found to have elevated triglycerides. Encephalopathy felt to be secondary to anoxia. Started on empiric antibiotics vancomycin/meropenem on 12/7 due to concern for infectious etiology after developing fever and leukocytosis, however vancomycin discontinued after presumed drug reaction. Started on Precedex. Underwent tracheostomy by ENT on 12/18 due to ventilator dependence. Subsequently spiked another fever on 12/19 with CXR findings suspicious for multifocal pneumonia for which he was started on  cefepime and Zyvox with some improvement. Weaned off of Precedex and tolerating trach collar support. Abx switched to IV ceftriaxone for MSSA pneumonia and completed course on 12/25. [facility] transferred initially to [facility] on 12/22 and then IMTS on 12/23. Pending SNF/LTAC placement.   Active Problems:   Schizoaffective disorder (HCC)   MDD (major depressive disorder), recurrent, severe, with psychosis (HCC)   DKA (diabetic ketoacidosis) (HCC)   Pancreatitis   Respiratory failure (HCC)   AKI (acute kidney injury) (HCC)   Dehydration   Toxic metabolic encephalopathy   Pressure injury of skin  This is hospital day 32.  Acute hypoxemic respiratory failure due to multifocal pneumonia S/p trach POD #12 S/p treatment for Sepsis/MSSA bacteremia No significant change in mental status. Making small progress. WC trending down.  --Pending LTAC placement --Continue routine trach care --Continue guaifenesin 100 mg q4h  Acute metabolic encephalopathy S/p PEA cardiac arrest Schizophrenia Patient remains on tube feeds. SLP states they did not see bedside swallow screen order until today. Plan to do speaking valve early tomorrow morning.  --Bedside speaking valve and swallow screen --Continue Seroquel 100 mg BID  Tachycardia Still with HR in the 90s and 100s. Will continue to monitor. No signs of infection at the moment.  --Continue metoprolol 50 mg BID --Continue IVF    New diagnosis of diabetes presenting with DKA Blood sugar relatively stable. Ranges from 100s to 160s over the last 24 hrs.  --Continue Levemir 40u BID --Continue Novolog 8u q4h --Continue Tube feeds  Decubitus ulcer on coccyx, unstageable Improving. Pressured pads  applied to buttocks --Continue wound care  Diet: Tube feeds IVF: IVNS VTE: Lovenox CODE: Full code  Prior to Admission Living Arrangement: Home Anticipated Discharge Location: SNF Barriers to Discharge: SNF placement Dispo: Anticipated  discharge in approximately 1-2 day(s).   Signed: Steffanie Rainwater, MD 02/26/2020, 6:56 AM  Pager: [phone] Internal Medicine Teaching Service After 5pm on weekdays and 1pm on weekends: On Call pager: [phone]

## 2020-02-27 DIAGNOSIS — G931 Anoxic brain damage, not elsewhere classified: Secondary | ICD-10-CM

## 2020-02-27 LAB — CBC
HCT: 36.2 % — ABNORMAL LOW (ref 39.0–52.0)
Hemoglobin: 11.5 g/dL — ABNORMAL LOW (ref 13.0–17.0)
MCH: 29.6 pg (ref 26.0–34.0)
MCHC: 31.8 g/dL (ref 30.0–36.0)
MCV: 93.3 fL (ref 80.0–100.0)
Platelets: 429 10*3/uL — ABNORMAL HIGH (ref 150–400)
RBC: 3.88 MIL/uL — ABNORMAL LOW (ref 4.22–5.81)
RDW: 14.4 % (ref 11.5–15.5)
WBC: 12.3 10*3/uL — ABNORMAL HIGH (ref 4.0–10.5)
nRBC: 0 % (ref 0.0–0.2)

## 2020-02-27 LAB — BASIC METABOLIC PANEL
Anion gap: 13 (ref 5–15)
BUN: 22 mg/dL — ABNORMAL HIGH (ref 6–20)
CO2: 24 mmol/L (ref 22–32)
Calcium: 10.1 mg/dL (ref 8.9–10.3)
Chloride: 96 mmol/L — ABNORMAL LOW (ref 98–111)
Creatinine, Ser: 0.68 mg/dL (ref 0.61–1.24)
GFR, Estimated: >60 mL/min (ref >60)
Glucose, Bld: 134 mg/dL — ABNORMAL HIGH (ref 70–99)
Potassium: 4.3 mmol/L (ref 3.5–5.1)
Sodium: 133 mmol/L — ABNORMAL LOW (ref 135–145)

## 2020-02-27 LAB — GLUCOSE, CAPILLARY
Glucose-Capillary: 105 mg/dL — ABNORMAL HIGH (ref 70–99)
Glucose-Capillary: 126 mg/dL — ABNORMAL HIGH (ref 70–99)
Glucose-Capillary: 138 mg/dL — ABNORMAL HIGH (ref 70–99)
Glucose-Capillary: 156 mg/dL — ABNORMAL HIGH (ref 70–99)
Glucose-Capillary: 176 mg/dL — ABNORMAL HIGH (ref 70–99)
Glucose-Capillary: 179 mg/dL — ABNORMAL HIGH (ref 70–99)

## 2020-02-27 NOTE — Evaluation (Signed)
Clinical/Bedside Swallow Evaluation Patient Details  Name: Ezequias Lard MRN: [id number] Date of Birth: 1975-08-20  Today's Date: 02/27/2020 Time: SLP Start Time (ACUTE ONLY): 0945 SLP Stop Time (ACUTE ONLY): 1000 SLP Time Calculation (min) (ACUTE ONLY): 15 min  Past Medical History:  Past Medical History:  Diagnosis Date  . Hypertension   . Psychiatric problem    Seen at [facility] for unknown reason. Takes seroquel. History of hearing voices.  Not commandivng voices.   . Schizophrenia (HCC)   . Tremors of nervous system    Past Surgical History:  Past Surgical History:  Procedure Laterality Date  . none    . TRACHEOSTOMY TUBE PLACEMENT N/A 02/13/2020   Procedure: TRACHEOSTOMY;  Surgeon: Newman Pies, MD;  Location: [facility] OR;  Service: ENT;  Laterality: N/A;   HPI:  Pt is a 45 y.o. man with history of schizophrenia who presented to [facility] ED on 01/25/20 with acute encephalopathy and lethargy and was admitted for DKA. He was transferred to the ICU on 11/30 with agitation and hypotension, had subsequent PEA arrest. Patient was intubated 11/30. Found to have acute pancreatitis with unclear etiology though found to have elevated triglycerides. Encephalopathy felt to be secondary to anoxia. Pt is s/p trach on 12/18 due to ventilator dependence. Subsequently spiked another fever on 12/19 with CXR findings suspicious for multifocal pneumonia. [name] was downsized from #8 cuffed to #6 cuffless on 12/28.   Assessment / Plan / Recommendation Clinical Impression  Patient seen for BSE to determine readiness for objective swallow study. Patient was alert and tolerating PMV in place during PO trials of ice chips. He exhibited adequate oral control and mastication of ice chips, however with suspected delay in initiating swallow as well as immediate and delayed cough and throat clear. No change in vitals with PO's of small amount of ice chips, however patient appeared to briefly be in some distress when coughing  which was unproductive. Voice remained clear when PMV in place. SLP is recommending to monitor how patient is tolerating PMV with full supervision and then check for MBS readiness next date. SLP Visit Diagnosis: Dysphagia, unspecified (R13.10)    Aspiration Risk  Moderate aspiration risk    Diet Recommendation NPO   Medication Administration: Via alternative means    Other  Recommendations     Follow up Recommendations 24 hour supervision/assistance;LTACH      Frequency and Duration min 2x/week          Prognosis   Good     Swallow Study   General Date of Onset: 01/25/20 HPI: Pt is a 45 y.o. man with history of schizophrenia who presented to [facility] ED on 01/25/20 with acute encephalopathy and lethargy and was admitted for DKA. He was transferred to the ICU on 11/30 with agitation and hypotension, had subsequent PEA arrest. Patient was intubated 11/30. Found to have acute pancreatitis with unclear etiology though found to have elevated triglycerides. Encephalopathy felt to be secondary to anoxia. Pt is s/p trach on 12/18 due to ventilator dependence. Subsequently spiked another fever on 12/19 with CXR findings suspicious for multifocal pneumonia. [name] was downsized from #8 cuffed to #6 cuffless on 12/28. Type of Study: Bedside Swallow Evaluation Previous Swallow Assessment: N/A Diet Prior to this Study: NPO Respiratory Status: Trach Collar History of Recent Intubation: Yes Length of Intubations (days):  (trach on 12/18) Date extubated:  (trach 12/18) Behavior/Cognition: Alert;Cooperative;Pleasant mood Oral Cavity Assessment: Within Functional Limits Oral Care Completed by SLP: Yes Oral Cavity - Dentition: Adequate natural  dentition Self-Feeding Abilities: Total assist Patient Positioning: Upright in bed Baseline Vocal Quality: Other (comment) (with PMV, voice was strong and clear at phrase level) Volitional Cough: Congested;Weak    Oral/Motor/Sensory Function Overall Oral  Motor/Sensory Function: Within functional limits   Ice Chips Ice chips: Impaired Presentation: Spoon Pharyngeal Phase Impairments: Cough - Delayed;Throat Clearing - Delayed   Thin Liquid Thin Liquid: Not tested    Nectar Thick Nectar Thick Liquid: Not tested   Honey Thick Honey Thick Liquid: Not tested   Puree Puree: Not tested   Solid     Solid: Not tested      Angela Nevin, MA, CCC-SLP Speech Therapy [facility]

## 2020-02-27 NOTE — Care Management (Signed)
Acknowledge consult for LTAC, however patient does not have coverage for LTAC.

## 2020-02-27 NOTE — Progress Notes (Signed)
Subjective:   Hospital day: 33  Overnight event: NAEOV  Patient resting in bed.  He is able to speak fairly well even without a trach valve.  Inspection of the trach shows no evidence of mucus clogging.  He denies any significant new symptoms today but does appear to be making good strides with function despite having left upper extremity weakness and contractures.  Objective:  Vital signs in last 24 hours: Vitals:   02/26/20 2044 02/27/20 0041 02/27/20 0315 02/27/20 0446  BP:  (!) 118/92 (!) 104/91 (!) 125/92  Pulse: (!) 113 95 (!) 104 (!) 104  Resp: 16 16 20 19   Temp:  98.8 F (37.1 C)  98.5 F (36.9 C)  TempSrc:  Oral  Oral  SpO2: 92% 96% 94% 90%  Weight:      Height:        Physical Exam  General: Chronically ill appearing HEENT: Uncuffed trach collar in place with clear to yellow mucus drainage.  Lips appear dry oropharynx appears clear without lesions or rash CV: Tachycardic with regular rate.  No apparent murmurs rubs or gallops.  No significant lower extremity edema.  2+ palpable pulses bilaterally Pulmonary: No respiratory discretion no rales or rhonchi Extremities: Heels have pressure pads on them.  Dry cracking hands with sloughing of skin. Skin: Warm and dry  Neuro: Patient more active today able to follow basic commands continues to have left-sided weakness particularly in his left upper extremity with evidence of muscle contractures.  Assessment/Plan: Bryan [name] is a 45 year old male with a pertinent past medical history of schizophrenia and diabetes who presented to 56 on 01/25/2020 with acute metabolic encephalopathy secondary to DKA.  His clinical course has been complicated by acute pancreatitis resulting in significant hypotension requiring emergent intubation due to PEA arrest.  Subsequent MSSA pneumonia which is all resolved and is now recovering with NG tube and saturating well on trach collar.  Active Problems:   Schizoaffective disorder  (HCC)   MDD (major depressive disorder), recurrent, severe, with psychosis (HCC)   DKA (diabetic ketoacidosis) (HCC)   Pancreatitis   Respiratory failure (HCC)   AKI (acute kidney injury) (HCC)   Dehydration   Toxic metabolic encephalopathy   Pressure injury of skin  This is hospital day 33.  Tracheostomy secondary to acute hypoxic respiratory failure: Continues to have yellow/white mucus coming from his tracheostomy site.  He is saturating well on trach collar and makes good improvements each day.  We appreciate ENT's assistance managing patient's tracheostomy. -Continue routine trach care  Acute metabolic encephalopathy S/p PEA cardiac arrest with anoxic brain injury Schizophrenia Speech therapy evaluating patient today.  Appreciate their recommendations.  Continue tube feeds.  Working towards discontinuing NG tube and starting oral feeds.  Patient already speaking but likely benefit from a trach valve.  -Appreciate speech therapy's assistance -Work towards discontinuing NG tube in the near future -Trach valve today. --Continue Seroquel 100 mg BID  Sinus tachycardia Patient continues to have sinus tachycardia thought to be secondary to stress response. Good improvements each day did not show any obvious signs of infection. --Continue metoprolol 50 mg BID --Continue IVF    New diagnosis of diabetes presenting with DKA --Continue Levemir 40u BID --Continue Novolog 8u q4h --Continue Tube feeds  Decubitus ulcer on coccyx, unstageable -Continue wound care -Continue offloading pressure -Continue using pressure injury pads.  Diet: Tube feeds IVF: none VTE: Lovenox CODE: Full code  Prior to Admission Living Arrangement: Home Anticipated Discharge Location: LTAC Barriers  to Discharge: LTAC placement Dispo: Anticipated discharge in approximately 1-2 day(s).   Signed: Chari Manning, D.O.  Internal Medicine Resident, PGY-2 [facility]  Pager:  [phone] 11:52 AM, 02/27/2020   After 5pm on weekdays and 1pm on weekends: On Call pager: [phone]

## 2020-02-27 NOTE — Progress Notes (Signed)
   02/27/20 2000  Assess: MEWS Score  Temp 97.9 F (36.6 C)  BP 121/89  Pulse Rate (!) 121  ECG Heart Rate (!) 121  Resp 20  Level of Consciousness Alert  SpO2 93 %  O2 Device Tracheostomy Collar  Patient Activity (if Appropriate) In bed  O2 Flow Rate (L/min) 5 L/min  Assess: MEWS Score  MEWS Temp 0  MEWS Systolic 0  MEWS Pulse 2  MEWS RR 0  MEWS LOC 0  MEWS Score 2  MEWS Score Color Yellow  Assess: if the MEWS score is Yellow or Red  Were vital signs taken at a resting state? Yes  Focused Assessment No change from prior assessment  Early Detection of Sepsis Score *See Row Information* Low  MEWS guidelines implemented *See Row Information* Yes  Treat  Pain Scale PAINAD  Pain Score 1  Breathing 0  Negative Vocalization 1  Facial Expression 0  Body Language 0  Consolability 0  PAINAD Score 1

## 2020-02-27 NOTE — Evaluation (Signed)
Passy-Muir Speaking Valve - Evaluation Patient Details  Name: Bryan [name] MRN: [id number] Date of Birth: 08-10-1975  Today's Date: 02/27/2020 Time: 0915-0945 SLP Time Calculation (min) (ACUTE ONLY): 30 min  Past Medical History:  Past Medical History:  Diagnosis Date  . Hypertension   . Psychiatric problem    Seen at [facility] for unknown reason. Takes seroquel. History of hearing voices.  Not commandivng voices.   . Schizophrenia (HCC)   . Tremors of nervous system    Past Surgical History:  Past Surgical History:  Procedure Laterality Date  . none    . TRACHEOSTOMY TUBE PLACEMENT N/A 02/13/2020   Procedure: TRACHEOSTOMY;  Surgeon: Newman Pies, MD;  Location: [facility] OR;  Service: ENT;  Laterality: N/A;   HPI:  Pt is a 45 y.o. man with history of schizophrenia who presented to [facility] ED on 01/25/20 with acute encephalopathy and lethargy and was admitted for DKA. He was transferred to the ICU on 11/30 with agitation and hypotension, had subsequent PEA arrest. Patient was intubated 11/30. Found to have acute pancreatitis with unclear etiology though found to have elevated triglycerides. Encephalopathy felt to be secondary to anoxia. Pt is s/p trach on 12/18 due to ventilator dependence. Subsequently spiked another fever on 12/19 with CXR findings suspicious for multifocal pneumonia. [name] was downsized from #8 cuffed to #6 cuffless on 12/28.   Assessment / Plan / Recommendation Clinical Impression  Patient was alert, cooperative and able to tolerate PMV placement for a total of 30 minutes with SLP removing occasionally to check for change in vitals/tolerance. Patient did have a few instances of cough resulting in PMV being dislodged. Patient tolerated PMV without significant changes in vital signs, with HR in range of 112-115 (maximum was 120 but this was seen even in absence of PMV placement), RR was in range of 13-18 and SpO2 was in range of 93-96%. Patient did not exhibit any overt s/s respiratory  distress (no evidence of increased WOB, etc). He did report that his throat felt "sore" and sometimes that it felt "tight" but he reported this both with and without PMV in place. Patient was able to achieve a clear, strong voice with PMV in place and communicated with SLP at phrase level. He did not demonstrate ability to orally expectorate and all his secretions were coming out of trach. Patient did demonstrate voicing without PMV in place with quality judged to be wet, congested sounding and low vocal intensity. SLP Visit Diagnosis: Aphonia (R49.1)    SLP Assessment  Patient needs continued Speech Lanaguage Pathology Services    Follow Up Recommendations  24 hour supervision/assistance;LTACH    Frequency and Duration min 2x/week  2 weeks    PMSV Trial PMSV was placed for: 30 minutes Able to redirect subglottic air through upper airway: Yes Able to Attain Phonation: Yes Voice Quality: Normal Able to Expectorate Secretions: No Level of Secretion Expectoration with PMSV: Tracheal Breath Support for Phonation: Adequate Intelligibility: Intelligible Respirations During Trial: 15 SpO2 During Trial: 95 % Pulse During Trial: 112 Behavior: Alert;Cooperative;Expresses self well   Tracheostomy Tube       Vent Dependency  FiO2 (%): 28 %    Cuff Deflation Trial  GO   N/A (cuffless trach)       Angela Nevin, MA, CCC-SLP Speech Therapy [facility]

## 2020-02-28 LAB — GLUCOSE, CAPILLARY
Glucose-Capillary: 123 mg/dL — ABNORMAL HIGH (ref 70–99)
Glucose-Capillary: 131 mg/dL — ABNORMAL HIGH (ref 70–99)
Glucose-Capillary: 137 mg/dL — ABNORMAL HIGH (ref 70–99)
Glucose-Capillary: 163 mg/dL — ABNORMAL HIGH (ref 70–99)
Glucose-Capillary: 195 mg/dL — ABNORMAL HIGH (ref 70–99)
Glucose-Capillary: 200 mg/dL — ABNORMAL HIGH (ref 70–99)

## 2020-02-28 MED ORDER — WHITE PETROLATUM EX OINT
TOPICAL_OINTMENT | CUTANEOUS | Status: AC
  Administered 2020-02-28: 0.2
  Filled 2020-02-28: qty 28.35

## 2020-02-28 NOTE — Progress Notes (Signed)
Patient's mother, Ms. Simmons updated at bedside this evening 1630. All questions answered and concerns addressed. Discussed plan to have patient continue to work with PT/OT as well as work with social work for potential placement.

## 2020-02-28 NOTE — Progress Notes (Signed)
Subjective:   ON Events: None   Mr. Soderlund was seen and evaluated at bedside this AM. Patient currently able to talk clearly. States arms have been periodically "stuck." States he has been working with physical therapy in the bed, unable to get out of bed. We discussed his long term goals, patient verbalized understanding. All questions and concerns addressed at bedside.   Objective:  Vital signs in last 24 hours: Vitals:   02/27/20 2210 02/27/20 2336 02/28/20 0337 02/28/20 0500  BP: (!) 128/94 101/73 122/78   Pulse: (!) 120 100 (!) 105   Resp:  14 18   Temp:  98.6 F (37 C) 99.4 F (37.4 C)   TempSrc:  Oral Oral   SpO2:  95% 91%   Weight:    97.1 kg  Height:        Physical Exam Physical Exam Constitutional:      Appearance: He is ill-appearing.  HENT:     Nose:     Comments: Trach collar in place Eyes:     Conjunctiva/sclera: Conjunctivae normal.  Cardiovascular:     Rate and Rhythm: Regular rhythm. Tachycardia present.     Pulses: Normal pulses.     Heart sounds: Normal heart sounds. No murmur heard. No friction rub. No gallop.   Pulmonary:     Effort: Pulmonary effort is normal.     Breath sounds: Normal breath sounds. No wheezing, rhonchi or rales.  Abdominal:     General: Bowel sounds are normal.     Tenderness: There is no abdominal tenderness. There is no guarding.  Musculoskeletal:     Right lower leg: No edema.     Left lower leg: No edema.     Comments: Continued weakness in the LUE with contractures, able to move hand against gravity, cannot grip.   Skin:    General: Skin is warm.     Comments: Bilateral hands and feet with peeled/sloughing skin  Neurological:     Mental Status: He is alert and oriented to person, place, and time.  Psychiatric:        Mood and Affect: Mood normal.        Behavior: Behavior normal.     Assessment/Plan: Jakiah Goree is a 45 year old male with a pertinent past medical history of schizophrenia and diabetes  who presented to [facility] on 01/25/2020 with acute metabolic encephalopathy secondary to DKA.  His clinical course has been complicated by acute pancreatitis resulting in significant hypotension requiring emergent intubation due to PEA arrest.  Subsequent MSSA pneumonia which is all resolved and is now recovering with NG tube and saturating well on trach collar.  Active Problems:   Schizoaffective disorder (HCC)   MDD (major depressive disorder), recurrent, severe, with psychosis (HCC)   DKA (diabetic ketoacidosis) (HCC)   Pancreatitis   Respiratory failure (HCC)   AKI (acute kidney injury) (HCC)   Dehydration   Toxic metabolic encephalopathy   Pressure injury of skin  Tracheostomy secondary to acute hypoxic respiratory failure: Continues to have yellow/white mucus coming from his tracheostomy site.  He is saturating well on trach collar and continues to make improvements each day.  We appreciate ENT's assistance managing patient's tracheostomy. -Continue routine trach care  Acute metabolic encephalopathy S/p PEA cardiac arrest with anoxic brain injury Schizophrenia  Continue tube feeds. Working towards discontinuing NG tube and starting oral feeds.  Patient already speaking before his trach valve placement, speech is onboard and continuing to work with Mr. Mittelman.  -  Appreciate speech therapy's assistance and recommendations.  -Continuing to work towards discontinuing NG tube -Continue Seroquel 100 mg BID  Sinus tachycardia Patient continues to have sinus tachycardia thought to be secondary to stress response. Good improvements each day did not show any obvious signs of infection. --Continue metoprolol 50 mg BID --Continue IVF    New diagnosis of diabetes presenting with DKA --Continue Levemir 40u BID --Continue Novolog 8u q4h --Continue Tube feeds  Decubitus ulcer on coccyx, unstageable -Continue wound care -Continue offloading pressure -Continue using pressure injury  pads.  Diet: Tube feeds IVF: none VTE: Lovenox CODE: Full code  Prior to Admission Living Arrangement: Home Anticipated Discharge Location: LTAC Barriers to Discharge: LTAC placement Dispo: Anticipated discharge in approximately 1-2 day(s).   Signed: Dolan Amen, MD  Internal Medicine Resident, PGY-2 [facility]  Pager: [phone] 6:57 AM, 02/28/2020   After 5pm on weekdays and 1pm on weekends: On Call pager: [phone]

## 2020-02-28 NOTE — Progress Notes (Signed)
  Speech Language Pathology Treatment: Dysphagia  Patient Details Name: Bryan [name] MRN: [id number] DOB: Oct 05, 1975 Today's Date: 02/28/2020 Time: 1035-1100 SLP Time Calculation (min) (ACUTE ONLY): 25 min  Assessment / Plan / Recommendation Clinical Impression  Patient seen to address dysphagia goals and determine readiness for objective swallow test. Of note, patient is speaking without PMV with voice sounding even more strong and clear than previous date when he was exhibiting this. No change in RR, HR, SpO2 before, during and after PMV placement and PO intake with HR: 120, RR: 20-22, SpO2: 92-95%. Patient exhibited prolonged oral transit of ice chip and limited amount of mastication as he primarily held ice chip in mouth. Patient did exhibit swallow response with immediate cough on 1 of 3 trials. Patient appears ready for MBS with plan for this to be completed next date. Patient is currently nutritionally supported via Cortrak.   HPI HPI: Bryan [name] is a 45 y.o. man with history of schizophrenia who presented to [facility] ED on 01/25/20 with acute encephalopathy and lethargy and was admitted for DKA. He was transferred to the ICU on 11/30 with agitation and hypotension, had subsequent PEA arrest. Patient was intubated 11/30 and trached on 12/18. Found to have acute pancreatitis with unclear etiology though found to have elevated triglycerides. Encephalopathy felt to be secondary to anoxia. Bryan [name] is s/p trach on 12/18 due to ventilator dependence. Subsequently spiked another fever on 12/19 with CXR findings suspicious for multifocal pneumonia. Bryan [name] was downsized from #8 cuffed to #6 cuffless on 12/28.      SLP Plan  Continue with current plan of care;MBS       Recommendations  Diet recommendations: NPO Medication Administration: Via alternative means      Patient may use Passy-Muir Speech Valve: Intermittently with supervision PMSV Supervision: Full MD: Please consider changing trach tube to : Smaller  size         Oral Care Recommendations: Oral care QID;Staff/trained caregiver to provide oral care Follow up Recommendations: 24 hour supervision/assistance;LTACH SLP Visit Diagnosis: Dysphagia, unspecified (R13.10) Plan: Continue with current plan of care;MBS       GO               Angela Nevin, MA, CCC-SLP Speech Therapy [facility]

## 2020-02-28 NOTE — Discharge Summary (Addendum)
Name: Bryan [name] MRN: [id number] DOB: Jul 14, 1975 45 y.o. PCP: Default, Provider, MD  Date of Admission: 01/25/2020 12:08 PM Date of Discharge: 03/15/2020 Attending Physician: Dr. Angelia [name]  Discharge Diagnosis: Active Problems:   Schizoaffective disorder ([place])   MDD (major depressive disorder), recurrent, severe, with psychosis ([place])   DKA (diabetic ketoacidosis) ([place])   Pancreatitis   Respiratory failure ([place])   AKI (acute kidney injury) ([place])   Dehydration   Toxic metabolic encephalopathy   Pressure injury of skin    Discharge Medications: Allergies as of 03/15/2020      Reactions   Fluphenazine Shortness Of Breath, Swelling   Tongue swelling, and shaking   Coffee Bean Extract [coffea Arabica] Swelling   Vancomycin Rash   Bullous rash over trunk, arms, and face       Medication List    STOP taking these medications   ARIPiprazole ER 400 MG Srer injection Commonly known as: ABILIFY MAINTENA   hydrOXYzine 25 MG tablet Commonly known as: ATARAX/VISTARIL   risperiDONE 3 MG tablet Commonly known as: RISPERDAL   temazepam 30 MG capsule Commonly known as: RESTORIL   traZODone 50 MG tablet Commonly known as: DESYREL     TAKE these medications   benztropine 0.5 MG tablet Commonly known as: COGENTIN Take 1 tablet (0.5 mg total) by mouth 2 (two) times daily. For prevention of drug induced tremors   docusate sodium 100 MG capsule Commonly known as: COLACE Take 1 capsule (100 mg total) by mouth 2 (two) times daily.   Gerhardt's butt cream Crea Apply 1 application topically 2 (two) times daily.   HYDROcodone-acetaminophen 5-325 MG tablet Commonly known as: NORCO/VICODIN Take 1 tablet by mouth every 8 (eight) hours as needed.   ibuprofen 600 MG tablet Commonly known as: ADVIL Take 1 tablet (600 mg total) by mouth every 8 (eight) hours as needed.   insulin aspart 100 UNIT/ML injection Commonly known as: novoLOG Inject 5 Units into the skin 3 (three)  times daily with meals.   insulin detemir 100 UNIT/ML injection Commonly known as: LEVEMIR Inject 0.3 mLs (30 Units total) into the skin daily.   metFORMIN 500 MG 24 hr tablet Commonly known as: GLUCOPHAGE-XR Take 1 tablet (500 mg total) by mouth 2 (two) times daily with a meal.   metoprolol tartrate 50 MG tablet Commonly known as: LOPRESSOR Take 1 tablet (50 mg total) by mouth 2 (two) times daily.   multivitamin with minerals Tabs tablet Take 1 tablet by mouth daily.   pantoprazole 40 MG tablet Commonly known as: PROTONIX Take 1 tablet (40 mg total) by mouth daily.   QUEtiapine 100 MG tablet Commonly known as: SEROQUEL Take 1 tablet (100 mg total) by mouth every morning.   QUEtiapine 100 MG tablet Commonly known as: SEROQUEL Take 1 tablet (100 mg total) by mouth at bedtime.            Discharge Care Instructions  (From admission, onward)         Start     Ordered   03/15/20 0000  Leave dressing on - Keep it clean, dry, and intact until clinic visit        03/15/20 1030          Disposition and follow-up:   Bryan [name] was discharged from [facility] in Good condition.  At the hospital follow up visit please address:  1.  Follow-up:  A. Tracheostomy secondary to acute hypoxic respiratory failure, s/p decannulation on 03/03/20: Patient has  since been able to breathe comfortably on room air. His stoma is healing well without drainage or exudate.    B. Acute metabolic encephalopathy s/p PEA cardiac arrest with anoxic brain injury; dysphagia; schizophrenia: His mental status has improved considerably over the course of this hospitalization. He is oriented to self and place, however has trouble remembering the time. He has been evaluated by SLP throughout this hospitalization and has been cleared for a Dysphagia 3 diet with steady improvement in his appetite. He has been continued on Seroquel 100 mg twice daily for his schizophrenia and doing  well. Consider re-addition or titration of his previous psychiatric medications.    C. Sinus tachycardia: Patient was persistently tachycardic during hospitalization, started on metoprolol 50 mg twice daily. By discharge, his HR was in the 80s-90s at rest.   D. New diagnosis of diabetes: Presented in DKA and found to have A1c of 13.4. Blood sugar was controlled with Levemir, novolog, and he was initiated on metformin. By discharge, he was on a regimen of Levemir 30U nightly, Novolog 5U TIDWC, and metformin glucophage XR 500 mg twice daily. Continue to monitor and adjust regimen as necessary.   E. Unstageable coccyx ulcer: We provided wound care during his hospitalization. Please ensure he continues to get daily wound care. He may need an air mattress to help offload pressure.    F. COVID-19 vaccination: Bryan [name] received his 1st Pfizer COVID vaccination on 03/06/2020. He will be eligible to receive his 2nd dose on or after 03/27/2020.  2.  Labs / imaging needed at time of follow-up: CBC, BMP  3.  Pending labs/ test needing follow-up: None  Follow-up Appointments:   Hospital Course by problem list: Bryan [name] is a 45 y.o. male with history of schizophrenia who presented to [facility] ED on 01/25/20 with acute encephalopathy and lethargy and was admitted for DKA. He was transferred to the ICU on 11/30 with agitation and hypotension, had subsequent PEA arrest. Found to have acute pancreatitis with unclear etiology though found to have elevated triglycerides. Encephalopathy felt to be secondary to anoxia. Started on empiric antibiotics vancomycin/meropenem on 12/7 due to concern for infectious etiology after developing fever and leukocytosis, however vancomycin discontinued after presumed drug reaction. Started on Precedex. Underwent tracheostomy by ENT on 12/18 due to ventilator dependence. Subsequently spiked another fever on 12/19 with CXR findings suspicious for multifocal pneumonia for which he was  started on cefepime and Zyvox with some improvement. Weaned off of Precedex and tolerated trach collar support. Antibiotics were switched to IV ceftriaxone for MSSA pneumonia and completed course on 12/25. Care transferred initially to [facility] on 12/22 and then to our Internal Medicine Teaching Service on 12/23. His trach was changed to uncuffed #6 Shiley trach tube on 12/28. He was slowly weaned of clonidine over a couple of days. Due to persistent tachycardia, he was initiated on metoprolol which he continued on with improvement by discharge to P 80s-90s. He continued to be tachycardic to the 90s-110s until discharge. Patient made gradual but significant progress physically and mentally. He self-decannulated his trach on 03/03/20 after nearly 48 hrs of a capping trial. The Cortrak through which he received tube feeds through much of his hospitalization was removed on 03/02/20, and he was cleared for a Dysphagia 3 diet which he has done well on. He was discharged to [facility] for further rehab services.     Discharge Vitals:   BP 111/80 (BP Location: Right Arm)   Pulse 88   Temp  98.8 F (37.1 C) (Oral)   Resp 18   Ht 5\' 10"  (1.778 m)   Wt 91.6 kg   SpO2 96%   BMI 28.98 kg/m   Pertinent Labs, Studies, and Procedures:  CBC Latest Ref Rng & Units 03/09/2020 03/02/2020 02/27/2020  WBC 4.0 - 10.5 K/uL 7.1 9.2 12.3(H)  Hemoglobin 13.0 - 17.0 g/dL 11.9(L) 11.4(L) 11.5(L)  Hematocrit 39.0 - 52.0 % 36.9(L) 35.7(L) 36.2(L)  Platelets 150 - 400 K/uL 308 337 429(H)    CMP Latest Ref Rng & Units 03/09/2020 03/02/2020 02/27/2020  Glucose 70 - 99 mg/dL 87 04/26/2020) 638(G)  BUN 6 - 20 mg/dL 9 19 665(L)  Creatinine 0.61 - 1.24 mg/dL 93(T 7.01 7.79  Sodium 135 - 145 mmol/L 136 132(L) 133(L)  Potassium 3.5 - 5.1 mmol/L 3.5 4.1 4.3  Chloride 98 - 111 mmol/L 99 97(L) 96(L)  CO2 22 - 32 mmol/L 26 25 24   Calcium 8.9 - 10.3 mg/dL 3.90 30.0  Total Protein 6.5 - 8.1 g/dL - - -  Total Bilirubin 0.3 - 1.2 mg/dL - - -   Alkaline Phos 38 - 126 U/L - - -  AST 15 - 41 U/L - - -  ALT 0 - 44 U/L - - -    03/02/20 PORTABLE CHEST 1 VIEW  COMPARISON:  02/15/2020  FINDINGS: Lung volumes are small, but are symmetric. Minimal left basilar and right mid lung zone atelectasis. Previously noted left basilar pulmonary infiltrate and small left pleural effusion have resolved. No pneumothorax or pleural effusion. Cardiac size within normal limits. Tracheostomy unchanged. Nasoenteric feeding tube has been withdrawn and its tip is now seen within the distal esophagus.  IMPRESSION: Nasoenteric feeding tube tip within the distal esophagus.  Stable pulmonary hypoinflation.  Resolved left basilar pulmonary infiltrate and parapneumonic effusion.  These results will be called to the ordering clinician or representative by the Radiologist Assistant, and communication documented in the PACS or 04/30/20.  Electronically Signed   By: 02/17/2020 MD   On: 03/02/2020 05:46  02/29/2020 SLP evaluation  MBS completed without PMV donned. He exhibited minimal oropharyngeal dysphagi with aspiration of thin only when pill propulsion was attempted. His initiation of swallow onset was timely with adeqauate hyolaryngeal anterior movement and elevation. Epiglottic deflection appropriate with slower recoil noted- possibly due to Cortrak. Vallecular and pyriform sinus residue was minimal. Recommend he initiate a Dys 3 texture, thin liquids, straws allowed and pills administered whole in puree. Pt should wear speaking valve to facilitate swallow but is not a neccesity.   02/01/2020 CT ABDOMEN AND PELVIS WITHOUT CONTRAST  COMPARISON:  CT chest 01/27/2020  FINDINGS: Lower chest: BILATERAL lower lobe consolidation LEFT greater than RIGHT  Hepatobiliary: Gallbladder and liver normal appearance  Pancreas: Diffusely enlarged pancreas with extensive peripancreatic edema consistent with acute pancreatitis, with  decrease in peripancreatic and upper abdominal edema since the prior study. Fluid layering along LEFT anterior pararenal fascia, decreased. No definite evidence of pancreatic hemorrhage, mass, or pancreatitis-associated fluid collection.  Spleen: Normal appearance  Adrenals/Urinary Tract: Adrenal glands, kidneys, and ureters normal appearance. Bladder decompressed by Foley catheter.  Stomach/Bowel: Nasogastric tube in stomach. Normal appendix. Stomach and bowel loops grossly normal appearance. Rectal tube present.  Vascular/Lymphatic: Aorta normal caliber. Unable to assess vascular patency due to lack of IV contrast. No adenopathy.  Reproductive: Unremarkable prostate gland and seminal vesicles  Other: Minimal free fluid in pelvis. No free air. Tiny umbilical hernia containing fat. LEFT inguinal hernia containing fat.  Musculoskeletal: Unremarkable  IMPRESSION: Acute pancreatitis with interval decrease in peripancreatic and upper abdominal edema since the prior exam.  No definite evidence of pancreatic hemorrhage, mass, or pancreatitis-associated fluid collection.  BILATERAL lower lobe consolidation LEFT greater than RIGHT.  LEFT inguinal and tiny umbilical hernias containing fat.   Electronically Signed   By: Ulyses Southward M.D.   On: 02/01/2020 09:06  01/31/2020 MRI HEAD WITHOUT AND WITH CONTRAST  CLINICAL DATA:  Cardiac arrest. Rule out anoxic encephalopathy. Abnormal EEG  FINDINGS: Brain: Ventricle size and cerebral volume normal. Negative for acute infarct. No significant chronic ischemia. Negative for hemorrhage mass or edema.  Normal enhancement postcontrast administration.  Vascular: Normal arterial flow voids.  Skull and upper cervical spine: Negative  Sinuses/Orbits: Paranasal sinuses clear.  Normal orbit  Other: None  IMPRESSION: Normal MRI brain with contrast.  Electronically Signed   By: Marlan Palau M.D.   On:  01/31/2020 11:45   01/27/2020 CT ABDOMEN PELVIS FINDINGS  Hepatobiliary: No focal liver abnormality is seen. No gallstones, gallbladder wall thickening, or biliary dilatation.  Pancreas: The pancreatic parenchyma is mildly thickened and there is moderate peripancreatic inflammatory stranding in keeping with changes of acute pancreatitis. There is circumferential thickening of the second and third portion of the duodenum adjacent to the head of the pancreas, likely related to the adjacent inflammatory process. Inflammatory infiltrate extends into the omental fat anteriorly as well as bilaterally into the anterior pararenal spaces. No loculated peripancreatic fluid collections. The pancreatic duct is not dilated. Parenchymal enhancement is not assessed on this noncontrast examination.  Spleen: Unremarkable  Adrenals/Urinary Tract: Adrenal glands are unremarkable. Kidneys are normal, without renal calculi, focal lesion, or hydronephrosis. Bladder is unremarkable.  Stomach/Bowel: Aside from inflammatory change involving the second and third portion of the duodenum, the stomach, small bowel, and large bowel are unremarkable. Appendix normal. No free intraperitoneal gas. Mild free fluid within the pelvis.  Vascular/Lymphatic: Mild atherosclerotic calcification within the right common iliac artery. No aortic aneurysm. No pathologic adenopathy within the abdomen and pelvis.  Reproductive: Prostate is unremarkable.  Other: Small fat containing indirect left inguinal hernia and tiny fat containing umbilical hernia. Rectum unremarkable  Musculoskeletal: No acute bone abnormality.  IMPRESSION: Moderate acute pancreatitis. Mild ascites. Pancreatic parenchymal viability is not well assessed on this noncontrast examination.  Small bilateral pleural effusions. Subtotal collapse of the lower lobes and right upper lobe.  Aortic Atherosclerosis  (ICD10-I70.0).  Electronically Signed   By: Helyn Numbers MD   On: 01/27/2020 04:06  01/26/2020 DG CHEST PORT 1 VIEW   CLINICAL DATA:  Endotracheal tube and central line. EXAM: PORTABLE CHEST 1 VIEW COMPARISON:  January 25, 2020. FINDINGS: Stable cardiomediastinal silhouette. Endotracheal and nasogastric tubes are in grossly good position. Left internal jugular catheter is noted with distal tip in expected position of the SVC. No pneumothorax is noted. No pleural effusion is noted. Hypoinflation of the lungs is noted. Lungs are clear. Bony thorax is unremarkable. IMPRESSION: Endotracheal and nasogastric tubes in grossly good position. Left internal jugular catheter is noted with distal tip in expected position of the SVC. Electronically Signed   By: Lupita Raider M.D.   On: 01/26/2020 18:51   01/25/2020 Portable chest x-ray (1 view)  CLINICAL DATA:  Leukocytosis with altered mental status EXAM: PORTABLE CHEST 1 VIEW COMPARISON:  None FINDINGS: No edema or airspace opacity. Heart size and pulmonary vascularity are normal. No adenopathy. No bone lesions. IMPRESSION: No edema or airspace opacity. Cardiac silhouette within normal  limits. Electronically Signed   By: Bretta Bang III M.D.   On: 01/25/2020 18:23     Discharge Instructions: Discharge Instructions    Call MD for:  extreme fatigue   Complete by: As directed    Call MD for:  persistant nausea and vomiting   Complete by: As directed    Call MD for:  redness, tenderness, or signs of infection (pain, swelling, redness, odor or green/yellow discharge around incision site)   Complete by: As directed    Call MD for:  severe uncontrolled pain   Complete by: As directed    Call MD for:  temperature >100.4   Complete by: As directed    Diet - low sodium heart healthy   Complete by: As directed    Increase activity slowly   Complete by: As directed    Leave dressing on - Keep it clean, dry, and intact until clinic visit    Complete by: As directed       Signed: Alphonzo Severance, MD 03/15/2020, 11:07 AM   Pager: [phone]

## 2020-02-29 ENCOUNTER — Inpatient Hospital Stay (HOSPITAL_COMMUNITY): Payer: Medicaid Other

## 2020-02-29 LAB — GLUCOSE, CAPILLARY
Glucose-Capillary: 144 mg/dL — ABNORMAL HIGH (ref 70–99)
Glucose-Capillary: 176 mg/dL — ABNORMAL HIGH (ref 70–99)
Glucose-Capillary: 152 mg/dL — ABNORMAL HIGH (ref 70–99)
Glucose-Capillary: 112 mg/dL — ABNORMAL HIGH (ref 70–99)
Glucose-Capillary: 159 mg/dL — ABNORMAL HIGH (ref 70–99)

## 2020-02-29 MED ORDER — WHITE PETROLATUM EX OINT
TOPICAL_OINTMENT | CUTANEOUS | Status: AC
  Filled 2020-02-29: qty 28.35

## 2020-02-29 MED ORDER — STERILE WATER FOR INJECTION IJ SOLN
INTRAMUSCULAR | Status: AC
  Filled 2020-02-29: qty 10

## 2020-02-29 NOTE — Progress Notes (Signed)
Physical Therapy Treatment Patient Details Name: Bryan [name] MRN: [id number] DOB: 10-05-1975 Today's Date: 02/29/2020    History of Present Illness Pt is a 45 y.o. man with history of schizophrenia who presented to [facility] ED on 01/25/20 with acute encephalopathy and lethargy and was admitted for DKA. He was transferred to the ICU on 11/30 with agitation and hypotension, had subsequent PEA arrest. Found to have acute pancreatitis with unclear etiology though found to have elevated triglycerides. Encephalopathy felt to be secondary to anoxia. Pt is s/p trach on 12/18 due to ventilator dependence. Subsequently spiked another fever on 12/19 with CXR findings suspicious for multifocal pneumonia    PT Comments    The pt was eager to participate in therapy session today with focus on progressing bed mobility and seated stability. The pt was able to tolerate progression to sitting EOB with use of bed pad and chair egress position to facilitate transfer and maintain trunk support. The pt requires mod/maxA to maintain seated balance, but was able to tolerate for 10-12 min while completing exercises for BLE. The pt did report some dizziness with transition to sitting, but reports improvement with continued sitting and activity. The pt will continue to benefit from skilled PT to progress seated balance, core control, and strength to allow for improved pt participation in bed mobility and transfers.     Follow Up Recommendations  LTACH     Equipment Recommendations   (defer to post acute)    Recommendations for Other Services       Precautions / Restrictions Precautions Precautions: Fall Precaution Comments: NG, flexi Restrictions Weight Bearing Restrictions: No    Mobility  Bed Mobility Overal bed mobility: Needs Assistance Bed Mobility: Supine to Sit;Sit to Supine     Supine to sit: Total assist;+2 for physical assistance;+2 for safety/equipment;HOB elevated Sit to supine: Total assist;+2  for physical assistance;+2 for safety/equipment;HOB elevated   General bed mobility comments: pt able to pull forward, transitioned to sitting EOB using bed pad and total support of trunk. pt able to initiate some movement of BLE, but difficulty coordinating movements and neesd some assist.  Transfers                 General transfer comment: not attempted today      Balance Overall balance assessment: Needs assistance Sitting-balance support: Feet supported Sitting balance-Leahy Scale: Poor Sitting balance - Comments: pt able to sit with modA when sitting EOB with feet supported. poor reactive balance, requires assist at all times to maintain Postural control: Posterior lean                                  Cognition Arousal/Alertness: Awake/alert Behavior During Therapy: Flat affect Overall Cognitive Status: Impaired/Different from baseline Area of Impairment: Attention;Following commands;Awareness;Problem solving                   Current Attention Level: Focused   Following Commands: Follows one step commands with increased time;Follows one step commands inconsistently   Awareness: Intellectual Problem Solving: Slow processing;Decreased initiation;Requires verbal cues;Requires tactile cues General Comments: pt following simple commands with increased time. benefits from verbal/tactile cues and 3-5 seconds processing prior to initiation of movement. verbalizing well when given increased time for response      Exercises General Exercises - Lower Extremity Ankle Circles/Pumps: AROM;Both;10 reps;Supine Long Arc Quad: AROM;Both;10 reps;Seated Heel Slides: AAROM;Both;10 reps;Supine Hip Flexion/Marching: AROM;Both;10 reps;Seated  General Comments General comments (skin integrity, edema, etc.): HR to 130 with activity sitting EOB      Pertinent Vitals/Pain Pain Assessment: No/denies pain Faces Pain Scale: No hurt Pain Intervention(s):  Monitored during session;Limited activity within patient's tolerance           PT Goals (current goals can now be found in the care plan section) Acute Rehab PT Goals Patient Stated Goal: to get up to chair PT Goal Formulation: Patient unable to participate in goal setting Time For Goal Achievement: 03/07/20 Potential to Achieve Goals: Good Progress towards PT goals: Progressing toward goals    Frequency    Min 2X/week      PT Plan Current plan remains appropriate       AM-PAC PT "6 Clicks" Mobility   Outcome Measure  Help needed turning from your back to your side while in a flat bed without using bedrails?: Total Help needed moving from lying on your back to sitting on the side of a flat bed without using bedrails?: Total Help needed moving to and from a bed to a chair (including a wheelchair)?: Total Help needed standing up from a chair using your arms (e.g., wheelchair or bedside chair)?: Total Help needed to walk in hospital room?: Total Help needed climbing 3-5 steps with a railing? : Total 6 Click Score: 6    End of Session Equipment Utilized During Treatment: Oxygen (trach) Activity Tolerance: Patient tolerated treatment well;Patient limited by fatigue Patient left: in bed;with call bell/phone within reach;with bed alarm set Nurse Communication: Mobility status PT Visit Diagnosis: Unsteadiness on feet (R26.81);Difficulty in walking, not elsewhere classified (R26.2);Muscle weakness (generalized) (M62.81)     Time: 1224-8250 PT Time Calculation (min) (ACUTE ONLY): 26 min  Charges:  $Therapeutic Exercise: 8-22 mins $Therapeutic Activity: 8-22 mins                     Rolm Baptise, PT, DPT   Acute Rehabilitation Department Pager #: [phone]   Gaetana Michaelis 02/29/2020, 5:42 PM

## 2020-02-29 NOTE — Progress Notes (Signed)
Modified Barium Swallow Progress Note  Patient Details  Name: Sabre Leonetti MRN: [id number] Date of Birth: 27-Dec-1975  Today's Date: 02/29/2020  Modified Barium Swallow completed.  Full report located under Chart Review in the Imaging Section.  Brief recommendations include the following:  Clinical Impression  MBS completed without PMV donned. He exhibited minimal oropharyngeal dysphagi with aspiration of thin only when pill propulsion was attempted. His initiation of swallow onset was timely with adeqauate hyolaryngeal anterior movement and elevation. Epiglottic deflection appropriate with slower recoil noted- possibly due to Cortrak. Vallecular and pyriform sinus residue was minimal. Recommend he initiate a Dys 3 texture, thin liquids, straws allowed and pills administered whole in puree. Pt should wear speaking valve to facilitate swallow but is not a neccesity. ST wil continue intervention.   Swallow Evaluation Recommendations       SLP Diet Recommendations: Dysphagia 3 (Mech soft) solids;Thin liquid   Liquid Administration via: Cup;Straw   Medication Administration: Whole meds with puree   Supervision: Patient able to self feed;Full supervision/cueing for compensatory strategies   Compensations: Minimize environmental distractions;Slow rate;Small sips/bites   Postural Changes: Seated upright at 90 degrees   Oral Care Recommendations: Oral care BID        Royce Macadamia 02/29/2020,2:43 PM   Breck Coons Lonell Face.Ed Nurse, children's [phone] Office [phone]

## 2020-02-29 NOTE — Progress Notes (Signed)
Subjective:   No acute events overnight.  Evaluated at bedside during rounds. Patient sleeping initially however arouses easily. Voice is clear and strong, speech is relatively slow. Patient states he is doing "ok," denies pain or discomfort. Discussed plan for further SLP evaluation with barium swallow.  Objective:  Vital signs in last 24 hours: Vitals:   02/29/20 0330 02/29/20 0346 02/29/20 0400 02/29/20 0433  BP:   129/90   Pulse: (!) 104  98   Resp: 20  17   Temp:  98.6 F (37 C)    TempSrc:      SpO2:  97% 93%   Weight:    96.9 kg  Height:       SpO2: 98 % O2 Flow Rate (L/min): 5 L/min FiO2 (%): 28 %  Constitutional: ill-appearing man sitting up in bed, in no acute distress HENT: normocephalic atraumatic, mucous membranes moist, trach collar in place Cardiovascular: tachycardic, no m/r/g Pulmonary/Chest: normal work of breathing, lungs clear to auscultation bilaterally, periodic wet cough Abdominal: soft, non-tender, non-distended Neurological: alert & oriented x 3, voice clear and strong, follows commands, speech is slow; continued weakness in the LUE with contractures, able to move hand against gravity, cannot grip.  Skin: warm and dry  Assessment/Plan: Bryan [name] is a 45 year old male with a pertinent past medical history of schizophrenia and diabetes who presented to [facility] on 01/25/2020 with acute metabolic encephalopathy secondary to DKA.  His clinical course has been complicated by acute pancreatitis resulting in significant hypotension requiring emergent intubation due to PEA arrest.  Subsequent MSSA pneumonia which is all resolved and is now recovering with NG tube and saturating well on trach collar.  This is hospital day 35.  Active Problems:   Schizoaffective disorder (HCC)   MDD (major depressive disorder), recurrent, severe, with psychosis (HCC)   DKA (diabetic ketoacidosis) (HCC)   Pancreatitis   Respiratory failure (HCC)   AKI (acute  kidney injury) (HCC)   Dehydration   Toxic metabolic encephalopathy   Pressure injury of skin  Tracheostomy secondary to acute hypoxic respiratory failure: Continues to have yellow/white mucus coming from his tracheostomy site. He is saturating well on trach collar, voice is clear and strong. Given his significant improvement, discussed with ENT who recommend capping trial and consideration of trach removal if patient tolerates capping for 24 hrs.  - Capping trial per RT, can remove trach if patient tolerates for 24 hrs - Continue routine trach care in the meantime  Acute metabolic encephalopathy S/p PEA cardiac arrest with anoxic brain injury Schizophrenia Working towards discontinuing NG tube and starting oral feeds. Patient already speaking before his trach valve placement, speech is onboard and continuing to work with Bryan [name].  - Appreciate speech therapy's assistance and recommendations.  - MBS today per SLP - Continuing to work towards discontinuing NG tube - Continue Seroquel 100 mg BID  Sinus tachycardia Patient continues to have sinus tachycardia thought to be secondary to stress response. - Continue metoprolol 50 mg BID - Continue IVF    New diagnosis of diabetes presenting with DKA - Continue Levemir 40u BID - Continue Novolog 8u q4h - Continue Tube feeds  Decubitus ulcer on coccyx, unstageable - Continue wound care - Continue offloading pressure - Continue using pressure injury pads  Diet: Tube feeds IVF: none VTE: Lovenox CODE: Full code  Prior to Admission Living Arrangement: Home Anticipated Discharge Location: LTAC vs SNF Barriers to Discharge: LTAC vs SNF   Signed: Alphonzo Severance, MD  Internal  Medicine Resident, PGY-1 [facility]  Pager: [phone] 6:25 AM, 02/29/2020   After 5pm on weekdays and 1pm on weekends: On Call pager: [phone]

## 2020-02-29 NOTE — Progress Notes (Signed)
Patient did good with dinner, eating 50% and drank all of the tea.  Patient tired and wanted to rest before trying more.  No s/s aspiration or distress when feeding patient.  Oral care provided afterwards.

## 2020-02-29 NOTE — Progress Notes (Signed)
  Speech Language Pathology  Patient Details Name: Aureliano Oshields MRN: [id number] DOB: 1975-07-16 Today's Date: 02/29/2020 Time:  -     MBS today at 12:00              Royce Macadamia 02/29/2020, 8:46 AM  Breck Coons Lonell Face.Ed Nurse, children's [phone] Office [phone]

## 2020-02-29 NOTE — Plan of Care (Signed)
  Problem: Education: Goal: Knowledge of General Education information will improve Description: Including pain rating scale, medication(s)/side effects and non-pharmacologic comfort measures Outcome: Progressing   Problem: Health Behavior/Discharge Planning: Goal: Ability to manage health-related needs will improve Outcome: Progressing   Problem: Clinical Measurements: Goal: Ability to maintain clinical measurements within normal limits will improve Outcome: Progressing Goal: Will remain free from infection Outcome: Progressing Goal: Diagnostic test results will improve Outcome: Progressing Goal: Respiratory complications will improve Outcome: Progressing Goal: Cardiovascular complication will be avoided Outcome: Progressing   Problem: Activity: Goal: Risk for activity intolerance will decrease Outcome: Progressing   Problem: Nutrition: Goal: Adequate nutrition will be maintained Outcome: Progressing   Problem: Coping: Goal: Level of anxiety will decrease Outcome: Progressing   Problem: Elimination: Goal: Will not experience complications related to bowel motility Outcome: Progressing Goal: Will not experience complications related to urinary retention Outcome: Progressing   Problem: Pain Managment: Goal: General experience of comfort will improve Outcome: Progressing   Problem: Safety: Goal: Ability to remain free from injury will improve Outcome: Progressing   Problem: Skin Integrity: Goal: Risk for impaired skin integrity will decrease Outcome: Progressing   Problem: Education: Goal: Ability to describe self-care measures that may prevent or decrease complications (Diabetes Survival Skills Education) will improve Outcome: Progressing Goal: Individualized Educational Video(s) Outcome: Progressing   Problem: Cardiac: Goal: Ability to maintain an adequate cardiac output will improve Outcome: Progressing   Problem: Health Behavior/Discharge  Planning: Goal: Ability to identify and utilize available resources and services will improve Outcome: Progressing Goal: Ability to manage health-related needs will improve Outcome: Progressing   Problem: Fluid Volume: Goal: Ability to achieve a balanced intake and output will improve Outcome: Progressing   Problem: Metabolic: Goal: Ability to maintain appropriate glucose levels will improve Outcome: Progressing   Problem: Nutritional: Goal: Maintenance of adequate nutrition will improve Outcome: Progressing Goal: Maintenance of adequate weight for body size and type will improve Outcome: Progressing   Problem: Respiratory: Goal: Will regain and/or maintain adequate ventilation Outcome: Progressing   Problem: Urinary Elimination: Goal: Ability to achieve and maintain adequate renal perfusion and functioning will improve Outcome: Progressing   Problem: Role Relationship: Goal: Method of communication will improve Outcome: Progressing

## 2020-02-29 NOTE — TOC Initial Note (Addendum)
Transition of Care [facility]) - Initial/Assessment Note    Patient Details  Name: Bryan [name] MRN: [id number] Date of Birth: February 17, 1976  [facility] CM/SW Contact:    Eduard Roux, LCSWA Phone Number: 02/29/2020, 5:33 PM  Clinical Narrative:                   CSW spoke with patient's mother,Bryan [name]. CSW introduced self and explained role. CSW discuss recommendation of short term rehab at [facility]. CSW explained the SNF process and informed if he was admitted at SNF, family/patient has to be agreeable to 30 day stay and provide the facility with his disability income for the month. Bryan [name] states patient can not afford to give up his income for the month- she requested patient  discharged home when medically stable. She states she is a CNA and has been in the field for 19 yrs . She and his brothers can care for him and provide 24/7 supervision if needed. At this time family is declining SNF placement. Patient's mother expressed she wants patient to sit in chair more and spend less time in the bed. She would like for the patient to continue to work with PT so that he can get stronger before discharging home.    [name], MSW, LCSW Clinical Social Worker   Expected Discharge Plan: Home w Home Health Services Barriers to Discharge: Continued Medical Work up   Patient Goals and CMS Choice        Expected Discharge Plan and Services Expected Discharge Plan: Home w Home Health Services In-house Referral: Clinical Social Work Discharge Planning Services: CM Consult   Living arrangements for the past 2 months: Apartment                                      Prior Living Arrangements/Services Living arrangements for the past 2 months: Apartment Lives with:: Self Patient language and need for interpreter reviewed:: No        Need for Family Participation in Patient Care: Yes (Comment) Care giver support system in place?: Yes (comment)   Criminal Activity/Legal  Involvement Pertinent to Current Situation/Hospitalization: No - Comment as needed  Activities of Daily Living Home Assistive Devices/Equipment: None ADL Screening (condition at time of admission) Patient's cognitive ability adequate to safely complete daily activities?: Yes Is the patient deaf or have difficulty hearing?: No Does the patient have difficulty seeing, even when wearing glasses/contacts?: No Does the patient have difficulty concentrating, remembering, or making decisions?: Yes Patient able to express need for assistance with ADLs?: Yes Does the patient have difficulty dressing or bathing?: No Independently performs ADLs?: Yes (appropriate for developmental age) Does the patient have difficulty walking or climbing stairs?: No Weakness of Legs: None Weakness of Arms/Hands: None  Permission Sought/Granted Permission sought to share information with : Family Supports Permission granted to share information with : Yes, Verbal Permission Granted  Share Information with NAME: Bryan [name]     Permission granted to share info w Relationship: mother  Permission granted to share info w Contact Information: [phone]  Emotional Assessment   Attitude/Demeanor/Rapport: Intubated (Following Commands or Not Following Commands) Affect (typically observed): Unable to Assess Orientation: : Fluctuating Orientation (Suspected and/or reported Sundowners) Alcohol / Substance Use: Not Applicable Psych Involvement: No (comment)  Admission diagnosis:  Dehydration [E86.0] Leukocytosis [D72.829] DKA (diabetic ketoacidosis) (HCC) [E11.10] AKI (acute [name] injury) (HCC) [N17.9] Diabetic ketoacidosis without  coma associated with type 2 diabetes mellitus (HCC) [E11.10] Patient Active Problem List   Diagnosis Date Noted  . Pressure injury of skin 02/16/2020  . Toxic metabolic encephalopathy   . AKI (acute [name] injury) (HCC)   . Dehydration   . Pancreatitis   . Respiratory failure  (HCC)   . DKA (diabetic ketoacidosis) (HCC) 01/25/2020  . 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 Paranoid schizophrenia (HCC)   . Undifferentiated schizophrenia (HCC)   . 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 Illiterate 10/30/2011  . Schizoaffective disorder, depressive type (HCC) 10/27/2011  . Schizoaffective disorder (HCC) 08/10/2011  . Observed seizure-like activity (HCC) 08/09/2011  . PAIN IN JOINT, MULTIPLE SITES 02/16/2010  . FATIGUE 02/16/2010  . Shortness of breath 02/16/2010   PCP:  Default, Provider, MD Pharmacy:   [facility] [phone] [address], [place] - [phone] AT [facility] [address] [place] [address] Phone: [phone] Fax: [phone]     Social Determinants of Health (SDOH) Interventions    Readmission Risk Interventions No flowsheet data found.

## 2020-03-01 LAB — GLUCOSE, CAPILLARY
Glucose-Capillary: 121 mg/dL — ABNORMAL HIGH (ref 70–99)
Glucose-Capillary: 121 mg/dL — ABNORMAL HIGH (ref 70–99)
Glucose-Capillary: 134 mg/dL — ABNORMAL HIGH (ref 70–99)
Glucose-Capillary: 136 mg/dL — ABNORMAL HIGH (ref 70–99)
Glucose-Capillary: 166 mg/dL — ABNORMAL HIGH (ref 70–99)
Glucose-Capillary: 169 mg/dL — ABNORMAL HIGH (ref 70–99)
Glucose-Capillary: 93 mg/dL (ref 70–99)

## 2020-03-01 MED ORDER — ENSURE ENLIVE PO LIQD
237.0000 mL | Freq: Two times a day (BID) | ORAL | Status: DC
  Administered 2020-03-01 – 2020-03-02 (×3): 237 mL via ORAL

## 2020-03-01 MED ORDER — OSMOLITE 1.5 CAL PO LIQD
960.0000 mL | ORAL | Status: DC
  Filled 2020-03-01 (×2): qty 1000

## 2020-03-01 NOTE — Progress Notes (Signed)
Patients trach capped per MD order. Patient suctioned before. PMV taken off and cap was placed. Patient able to talk. Patient stated he felt fine with cap placed. Vitals are stable. RN made aware. RT will monitor.

## 2020-03-01 NOTE — Progress Notes (Signed)
   03/01/20 1956  Assess: MEWS Score  Temp 98.3 F (36.8 C)  BP 118/89  Pulse Rate (!) 113  ECG Heart Rate (!) 113  Resp 14  SpO2 94 %  O2 Device Room Air  Assess: MEWS Score  MEWS Temp 0  MEWS Systolic 0  MEWS Pulse 2  MEWS RR 0  MEWS LOC 0  MEWS Score 2  MEWS Score Color Yellow  Assess: if the MEWS score is Yellow or Red  Were vital signs taken at a resting state? Yes  Focused Assessment No change from prior assessment  Early Detection of Sepsis Score *See Row Information* Low  MEWS guidelines implemented *See Row Information* Yes  Treat  MEWS Interventions Other (Comment) (no intervention needed at this time; baseline)  Take Vital Signs  Increase Vital Sign Frequency  Yellow: Q 2hr X 2 then Q 4hr X 2, if remains yellow, continue Q 4hrs  Escalate  MEWS: Escalate Yellow: discuss with charge nurse/RN and consider discussing with provider and RRT  Notify: Charge Nurse/RN  Name of Charge Nurse/RN Notified Geannie Risen, RN  Date Charge Nurse/RN Notified 03/01/20  Time Charge Nurse/RN Notified 2000  Document  Patient Outcome Other (Comment) (continue to monitor patient HR)  Progress note created (see row info) Yes

## 2020-03-01 NOTE — Progress Notes (Signed)
  Speech Language Pathology Treatment: Dysphagia  Patient Details Name: Bryan [name] MRN: [id number] DOB: 1975/08/26 Today's Date: 03/01/2020 Time: 9702-6378 SLP Time Calculation (min) (ACUTE ONLY): 18 min  Assessment / Plan / Recommendation Clinical Impression  Pt seen for dysphagia treatment with trach capped. He requires hand over hand assist with cup and consumed straw sips without apparent difficulty and challenged with multiple, consecutive sips. Effective and functional mastication with regular texture. Therapist upgraded to regular, continue thin. Will continue- he does not have orders for cognitive assessment however could benefit. Will request.    HPI HPI: Pt is a 45 y.o. man with history of schizophrenia who presented to [facility] ED on 01/25/20 with acute encephalopathy and lethargy and was admitted for DKA. He was transferred to the ICU on 11/30 with agitation and hypotension, had subsequent PEA arrest. Patient was intubated 11/30 and trached on 12/18. Found to have acute pancreatitis with unclear etiology though found to have elevated triglycerides. Encephalopathy felt to be secondary to anoxia. Pt is s/p trach on 12/18 due to ventilator dependence. Subsequently spiked another fever on 12/19 with CXR findings suspicious for multifocal pneumonia. [name] was downsized from #8 cuffed to #6 cuffless on 12/28.      SLP Plan  Continue with current plan of care       Recommendations  Diet recommendations: Regular;Thin liquid Liquids provided via: Cup;Straw Medication Administration: Crushed with puree Supervision: Staff to assist with self feeding Compensations: Slow rate;Small sips/bites Postural Changes and/or Swallow Maneuvers: Seated upright 90 degrees                Oral Care Recommendations: Oral care BID Follow up Recommendations: 24 hour supervision/assistance;LTACH SLP Visit Diagnosis: Dysphagia, unspecified (R13.10) Plan: Continue with current plan of care        GO                Bryan [name] 03/01/2020, 3:44 PM   Bryan [name] Bryan [name] M.Ed Nurse, children's [phone] Office [phone]

## 2020-03-01 NOTE — Progress Notes (Signed)
Nutrition Follow-up  DOCUMENTATION CODES:   Not applicable  INTERVENTION:  Provide Ensure Enlive po BID, each supplement provides 350 kcal and 20 grams of protein.  Encourage PO intake.   Transition to nocturnal tube feeds via Cortrak NGT, Osmolite 1.5 cal formula at rate of 80 ml/hr x 12 hours (7pm-7am).  Continue 45 ml Prosource TF given 5 times daily per tube.  Tube feeding regimen to provide 1640 kcal (75% of kcal needs), 115 grams of protein (89% of protein needs), and 730 ml free water.   Continue Juven BID, each packet provides 95 calories, 2.5 grams of protein to promote wound healing.   NUTRITION DIAGNOSIS:   Increased nutrient needs related to acute illness,wound healing as evidenced by estimated needs; ongoing  GOAL:   Patient will meet greater than or equal to 90% of their needs; progressing  MONITOR:   PO intake,Supplement acceptance,Skin,Diet advancement,TF tolerance,Weight trends,Labs,I & O's  REASON FOR ASSESSMENT:   Consult Enteral/tube feeding initiation and management (trickle tube feeds)  ASSESSMENT:   45 yo male admitted with AMS, new onset DM with DKA. PMH includes MDD, paranoid schizophrenia, HTN, smoker, drug use (cocaine, marijuana), heavy alcohol use.  S/P Cortrak placement 12/17, tip in the stomach. S/P tracheostomy 12/18. On trach collar since 12/20.  Trach capped today for 24 hour trial. Per MD, if pt able to tolerate capping trial, may remove tracheostomy tube. Pt underwent MBS yesterday. Diet has been advanced to a dysphagia 3 diet with thin liquids. Meal completion has been 50-85%. RN reports however pt with difficulties swallowing and chewing with certain food textures and thus would take a long time to eat. Cortrak remains in place. RD to transition tube feeds to nocturnal feeds to encouraged PO intake during the day. Will continue to monitor po intake over the next couple of days to see if NGT will be ready and appropriate to be  removed.  Labs and medications reviewed.   Diet Order:   Diet Order            DIET DYS 3 Room service appropriate? No; Fluid consistency: Thin  Diet effective now                 EDUCATION NEEDS:   Not appropriate for education at this time  Skin:  Skin Assessment: Reviewed RN Assessment Skin Integrity Issues:: Stage II,Other (Comment) Stage II: coccyx (12/17) Other: MASD and skin tear to buttocks  Last BM:  1/4 rectal tube 100 ml output  Height:   Ht Readings from Last 1 Encounters:  01/25/20 5\' 10"  (1.778 m)    Weight:   Wt Readings from Last 1 Encounters:  03/01/20 95.8 kg    Ideal Body Weight:  75.5 kg  BMI:  Body mass index is 30.3 kg/m.  Estimated Nutritional Needs:   Kcal:  2200-2400  Protein:  130-150 gm  Fluid:  2.5 L  04/29/20, MS, RD, LDN RD pager number/after hours weekend pager number on Amion.

## 2020-03-01 NOTE — Progress Notes (Signed)
RN arrived in room to give Pt's 1700 meds. When attempting to flush the previously clamped cortrak RN met resistance and was not able to. RN used a smaller bore syringe and was able to successfully flush and give medications slowly. Pt has order for nocturnal tube feeds to be started this evening. RN was concerned TF would not be able to run. RN notified MD and asked if all options were exhausted to attempt to unclog cortrak could TF be held. Because Pt now has a regular diet MD stated TF could be held if cortrak could not be unclogged. RN placed order for meds needed to unclog cortrak and will pass on to night shift RN.

## 2020-03-01 NOTE — Progress Notes (Signed)
Subjective:   No acute events overnight. Due to evaluation by SLP yesterday, capping trial was not performed yesterday. Patient reportedly ate 50% of his dinner without signs of aspiration or distress.  Evaluated at bedside during rounds this morning. The patient states he is doing "good, better" because he was able to eat food yesterday. Notes he is feeling tired after eating and physical therapy. Denies pain, nausea, general discomfort. During evaluation at bedside, patient originally on trach collar with 5 lpm at FiO2 28%, however maintained oxygen saturation ~95% after turning his oxygen off, no respiratory distress.  Objective:  Vital signs in last 24 hours: Vitals:   03/01/20 0324 03/01/20 0327 03/01/20 0400 03/01/20 0500  BP: 115/89  117/89   Pulse: 95 96 99   Resp: 16 15 15    Temp: 98.5 F (36.9 C)  98.4 F (36.9 C)   TempSrc: Oral  Oral   SpO2: 93% 95% 97%   Weight:    95.8 kg  Height:       Constitutional: ill-appearing man sitting up in bed, in no acute distress HENT: normocephalic atraumatic, mucous membranes moist, trach collar in place Cardiovascular: tachycardic, no m/r/g Pulmonary/Chest: normal work of breathing, lungs clear to auscultation bilaterally, less frequent cough this morning; O2 sat 95% off of supplemental oxygen Abdominal: soft, non-tender, non-distended Neurological: alert & oriented x 3, voice clear and strong, follows commands, speech is clear, still slow but improved from yesterday; continued weakness in the LUE with contractures, able to move hand against gravity, able to grip weakly today Skin: warm and dry  Assessment/Plan: Bryan [name] is a 45 year old male with a pertinent past medical history of schizophrenia and diabetes who presented to 56 on 01/25/2020 with acute metabolic encephalopathy secondary to DKA.  His clinical course has been complicated by acute pancreatitis resulting in significant hypotension requiring emergent  intubation due to PEA arrest.  Subsequent MSSA pneumonia which is all resolved and is now recovering with NG tube and saturating well on trach collar.  This is hospital day 5.  Active Problems:   Schizoaffective disorder (HCC)   MDD (major depressive disorder), recurrent, severe, with psychosis (HCC)   DKA (diabetic ketoacidosis) (HCC)   Pancreatitis   Respiratory failure (HCC)   AKI (acute kidney injury) (HCC)   Dehydration   Toxic metabolic encephalopathy   Pressure injury of skin  Tracheostomy secondary to acute hypoxic respiratory failure: He is saturating well on trach collar, voice is clear and strong. Given his significant improvement, discussed with ENT who recommend capping trial and consideration of trach removal if patient tolerates capping for 24 hrs. Capping trial deferred yesterday due SLP eval/MBS. - Capping trial per RT, can remove trach if patient tolerates for 24 hrs - Continue routine trach care in the meantime  Acute metabolic encephalopathy S/p PEA cardiac arrest with anoxic brain injury Schizophrenia MBS yesterday (02/29/20), started on dysphagia 3 diet which he reportedly tolerated well without signs of aspiration or distress. Working towards discontinuing NG tube, will consult dietition. Patient already speaking before his trach valve placement, speech is onboard and continuing to work with Bryan [name].  - Appreciate speech therapy's assistance and recommendations.  - Dysphagia 3 (Mech soft) solids, thin liquids, whole meds with puree - Consult dietitian for recs for transition off of tube feeds  - Continue tube feeds for now - Continue Seroquel 100 mg BID  Sinus tachycardia Patient continues to have sinus tachycardia, though improved from earlier this admission, thought to be secondary  to stress response. - Continue metoprolol 50 mg BID - Continue IVF    New diagnosis of diabetes presenting with DKA - Continue Levemir 40u BID - Continue Novolog 8u q4h -  Continue Tube feeds  Decubitus ulcer on coccyx, unstageable - Continue wound care - Continue offloading pressure - Continue using pressure injury pads  Diet: Tube feeds IVF: none VTE: Lovenox CODE: Full code  Prior to Admission Living Arrangement: Home PT/OT recs: LTAC Anticipated Discharge Location: TBD - PT/OT recommend LTAC, however per TOC conversation with patient's family, family would like him to be discharged home with home health services. Mother and patient's brothers reportedly can provide 24/7 supervision. We feel strongly the patient would benefit from [facility] or SNF for more intensive rehabilitation. Will discuss further with patient's family.  Signed: Alphonzo Severance, MD  Internal Medicine Resident, PGY-1 [facility]  Pager: [phone] 6:00 AM, 03/01/2020   After 5pm on weekdays and 1pm on weekends: On Call pager: [phone]

## 2020-03-02 ENCOUNTER — Inpatient Hospital Stay (HOSPITAL_COMMUNITY): Payer: Medicaid Other

## 2020-03-02 LAB — BASIC METABOLIC PANEL
Anion gap: 10 (ref 5–15)
BUN: 19 mg/dL (ref 6–20)
CO2: 25 mmol/L (ref 22–32)
Calcium: 10 mg/dL (ref 8.9–10.3)
Chloride: 97 mmol/L — ABNORMAL LOW (ref 98–111)
Creatinine, Ser: 0.73 mg/dL (ref 0.61–1.24)
GFR, Estimated: >60 mL/min (ref >60)
Glucose, Bld: 151 mg/dL — ABNORMAL HIGH (ref 70–99)
Potassium: 4.1 mmol/L (ref 3.5–5.1)
Sodium: 132 mmol/L — ABNORMAL LOW (ref 135–145)

## 2020-03-02 LAB — CBC WITH DIFFERENTIAL/PLATELET
Abs Immature Granulocytes: 0.04 10*3/uL (ref 0.00–0.07)
Basophils Absolute: 0 10*3/uL (ref 0.0–0.1)
Basophils Relative: 0 %
Eosinophils Absolute: 0.1 10*3/uL (ref 0.0–0.5)
Eosinophils Relative: 1 %
HCT: 35.7 % — ABNORMAL LOW (ref 39.0–52.0)
Hemoglobin: 11.4 g/dL — ABNORMAL LOW (ref 13.0–17.0)
Immature Granulocytes: 0 %
Lymphocytes Relative: 24 %
Lymphs Abs: 2.2 10*3/uL (ref 0.7–4.0)
MCH: 30.1 pg (ref 26.0–34.0)
MCHC: 31.9 g/dL (ref 30.0–36.0)
MCV: 94.2 fL (ref 80.0–100.0)
Monocytes Absolute: 0.7 10*3/uL (ref 0.1–1.0)
Monocytes Relative: 8 %
Neutro Abs: 6.1 10*3/uL (ref 1.7–7.7)
Neutrophils Relative %: 67 %
Platelets: 337 10*3/uL (ref 150–400)
RBC: 3.79 MIL/uL — ABNORMAL LOW (ref 4.22–5.81)
RDW: 14.5 % (ref 11.5–15.5)
WBC: 9.2 10*3/uL (ref 4.0–10.5)
nRBC: 0 % (ref 0.0–0.2)

## 2020-03-02 LAB — TRIGLYCERIDES: Triglycerides: 505 mg/dL — ABNORMAL HIGH (ref <150)

## 2020-03-02 LAB — GLUCOSE, CAPILLARY
Glucose-Capillary: 113 mg/dL — ABNORMAL HIGH (ref 70–99)
Glucose-Capillary: 133 mg/dL — ABNORMAL HIGH (ref 70–99)
Glucose-Capillary: 139 mg/dL — ABNORMAL HIGH (ref 70–99)
Glucose-Capillary: 145 mg/dL — ABNORMAL HIGH (ref 70–99)
Glucose-Capillary: 160 mg/dL — ABNORMAL HIGH (ref 70–99)
Glucose-Capillary: 172 mg/dL — ABNORMAL HIGH (ref 70–99)

## 2020-03-02 MED ORDER — ENSURE ENLIVE PO LIQD
237.0000 mL | Freq: Three times a day (TID) | ORAL | Status: DC
  Administered 2020-03-02 – 2020-03-15 (×33): 237 mL via ORAL

## 2020-03-02 NOTE — Progress Notes (Signed)
Nutrition Follow-up  DOCUMENTATION CODES:   Not applicable  INTERVENTION: Provide Ensure Enlive po TID, each supplement provides 350 kcal and 20 grams of protein.  Encourage adequate PO intake.   NUTRITION DIAGNOSIS:   Increased nutrient needs related to acute illness,wound healing as evidenced by estimated needs; ongoing  GOAL:   Patient will meet greater than or equal to 90% of their needs; progressing  MONITOR:   PO intake,Supplement acceptance,Skin,Diet advancement,TF tolerance,Weight trends,Labs,I & O's  REASON FOR ASSESSMENT:   Consult Enteral/tube feeding initiation and management (trickle tube feeds)  ASSESSMENT:   45 yo male admitted with AMS, new onset DM with DKA. PMH includes MDD, paranoid schizophrenia, HTN, smoker, drug use (cocaine, marijuana), heavy alcohol use.  S/P Cortrak placement 12/17, tip in the stomach. S/P tracheostomy 12/18. On trach collar since 12/20.  RD contacted via RN. Cortrak NGT clogged and dislodged with tip of tube in distal esophagus. Meal completion has been 50-85%. RN reports pt has been tolerating his po intake and intake has been improving. Plans to discontinue tube feeds and remove Cortrak NGT today. Pt currently has Ensure ordered and has been consuming them. RD to increase Ensure to TID to aid in caloric and protein needs.  Labs and medications reviewed.   Diet Order:   Diet Order            Diet regular Room service appropriate? Yes; Fluid consistency: Thin  Diet effective now                 EDUCATION NEEDS:   Not appropriate for education at this time  Skin:  Skin Assessment: Reviewed RN Assessment Skin Integrity Issues:: Stage II,Other (Comment) Stage II: coccyx (12/17) Other: MASD and skin tear to buttocks  Last BM:  1/4 rectal tube 100 ml output  Height:   Ht Readings from Last 1 Encounters:  01/25/20 5\' 10"  (1.778 m)    Weight:   Wt Readings from Last 1 Encounters:  03/02/20 97.8 kg    Ideal  Body Weight:  75.5 kg  BMI:  Body mass index is 30.94 kg/m.  Estimated Nutritional Needs:   Kcal:  2200-2400  Protein:  130-150 gm  Fluid:  2.5 L  04/30/20, MS, RD, LDN RD pager number/after hours weekend pager number on Amion.

## 2020-03-02 NOTE — Progress Notes (Signed)
Pt c/o difficulty breathing; oxygen stats 95% on room air; checked coretrack markings, no change noted; paged on call for orders.

## 2020-03-02 NOTE — Progress Notes (Signed)
Spoke with on call; no new orders at this time; Will continue to monitor oxygenation stats, excessive tachycardia, or new onset vomitting

## 2020-03-02 NOTE — Progress Notes (Signed)
Subjective:   Overnight, patient reported difficulty swallowing, sore throat, and dyspnea. Evaluated by night team at bedside who report patient in no acute distress, VSS, O2 sats in the mid 90s with trach capped on room air. Throat not erythematous, trach site wnl. Trach collar loosened, portable CXR obtained which was negative for an acute process though did show that the tip of the Cortrak is in the distal esophagus.  Evaluated at bedside during rounds this morning. The patient states he feels better this morning. Denies pain or tightness in his neck or chest. Nurse at bedside reports she spoke with dietitian who agrees with pulling Cortrak. When asked how he would feel with the trach being removed, the patient did not have a response.  Objective:  Vital signs in last 24 hours: Vitals:   03/02/20 0401 03/02/20 0503 03/02/20 0513 03/02/20 0515  BP: 110/84   122/82  Pulse: (!) 111 (!) 112 (!) 107 (!) 105  Resp: 14 12 18 15   Temp: 98.3 F (36.8 C)   98.3 F (36.8 C)  TempSrc: Oral     SpO2: 91% 96% 93% 95%  Weight:      Height:       Constitutional: ill-appearing man sitting up in bed, in no acute distress HENT: normocephalic atraumatic, mucous membranes moist, trach collar in place Cardiovascular: tachycardic in the 110s, no m/r/g Pulmonary/Chest: normal work of breathing, lungs clear to auscultation bilaterally, periodic cough this morning; O2 sat 95% off of supplemental oxygen Abdominal: soft, non-tender, non-distended Neurological: alert & oriented x 3, voice clear and strong, follows commands, speech is clear, still slow; continued weakness in the LUE with contractures, able to move hand against gravity, able to grip weakly Skin: warm, slight forehead diaphoresis  Assessment/Plan: Bryan [name] is a 45 year old male with a pertinent past medical history of schizophrenia and diabetes who presented to 56 on 01/25/2020 with acute metabolic encephalopathy secondary to  DKA.  His clinical course has been complicated by acute pancreatitis resulting in significant hypotension requiring emergent intubation due to PEA arrest.  Subsequent MSSA pneumonia which is all resolved and is now recovering, saturating well on trach which has been capped since 1/4 AM and with NG tube weaning tube feeds.  This is hospital day 37.  Active Problems:   Schizoaffective disorder (HCC)   MDD (major depressive disorder), recurrent, severe, with psychosis (HCC)   DKA (diabetic ketoacidosis) (HCC)   Pancreatitis   Respiratory failure (HCC)   AKI (acute kidney injury) (HCC)   Dehydration   Toxic metabolic encephalopathy   Pressure injury of skin  Tracheostomy secondary to acute hypoxic respiratory failure Trach capped by RT yesterday (1/4) am, and he continues to saturate well, voice is clear and strong. Overnight he reported difficulty swallowing, sore throat, and dyspnea. Evaluated by night team at bedside who report patient in no acute distress, VSS, O2 sats in the mid 90s with trach capped on room air. Throat not erythematous, trach site wnl. Trach collar loosened, portable CXR obtained which was negative for an acute process though did show that the tip of the Cortrak is in the distal esophagus. Will continue capping trial for an additional 24 hours and reevaluate tomorrow given patient's discomfort overnight.  - Continue capping trial, reevaluate tomorrow for possible trach removal - Continue routine trach care in the meantime  Acute metabolic encephalopathy S/p PEA cardiac arrest with anoxic brain injury Schizophrenia Doing well with dysphagia 3 diet started 2 days ago. With dietitian's guidance, transitioned  yesterday from continuous tube feeds to nightly tube feeds only. Given displacement of Cortrak and improving PO intake, will remove Cortrak today.   - Appreciate speech therapy's assistance and recommendations.  - Dysphagia 3 (Mech soft) solids, thin liquids, whole meds  with puree - Appreciate dietitian's assistance with transitioning off tube feeds - Remove Cortrak - Continue Seroquel 100 mg BID  Sinus tachycardia Stable, thought to be secondary to stress response. - Continue metoprolol 50 mg BID    New diagnosis of diabetes presenting with DKA - Continue Levemir 40u BID - Continue Novolog 8u q4h - Continue Tube feeds  Decubitus ulcer on coccyx, unstageable - Continue wound care - Continue offloading pressure - Continue using pressure injury pads  Diet: Dysphagia 3 IVF: none VTE: Lovenox CODE: Full code  Prior to Admission Living Arrangement: Home PT/OT recs: LTAC Anticipated Discharge Location: TBD - PT/OT recommend LTAC, however per TOC conversation with patient's family, family would like him to be discharged home with home health services. Mother and patient's brothers reportedly can provide 24/7 supervision. We feel strongly the patient would benefit from [facility] or SNF for more intensive rehabilitation. Will discuss further with patient's family.  Signed: Alphonzo Severance, MD  Internal Medicine Resident, PGY-1 [facility]  Pager: [phone] 5:59 AM, 03/02/2020   After 5pm on weekdays and 1pm on weekends: On Call pager: [phone]

## 2020-03-02 NOTE — Progress Notes (Signed)
Occupational Therapy Treatment Patient Details Name: Bryan [name] MRN: [id number] DOB: 1975/05/15 Today's Date: 03/02/2020    History of present illness Pt is a 45 y.o. man with history of schizophrenia who presented to [facility] ED on 01/25/20 with acute encephalopathy and lethargy and was admitted for DKA. He was transferred to the ICU on 11/30 with agitation and hypotension, had subsequent PEA arrest. Found to have acute pancreatitis with unclear etiology though found to have elevated triglycerides. Encephalopathy felt to be secondary to anoxia. Pt is s/p trach on 12/18 due to ventilator dependence. Subsequently spiked another fever on 12/19 with CXR findings suspicious for multifocal pneumonia   OT comments  RN reporting family to take patient home and will need maximum services for home d/c as pt requires max (A) for sit<>stand and total (A) for adls. Pt could benefit from continued OT services at SNF due to deconditioning and decr activity tolerance. Pt currently with Posterior L bias with balance. Pt will likely need lateral supports for any wheelchair positioning.    Follow Up Recommendations  SNF;Supervision/Assistance - 24 hour;Other (comment) (if family takes home max out services of care)    Equipment Recommendations  3 in 1 bedside commode;Wheelchair (measurements OT);Wheelchair cushion (measurements OT);Hospital bed;Other (comment);Tub/shower bench (hoyer)    Recommendations for Other Services Other (comment) (pallative home services if family goes home)    Precautions / Restrictions Precautions Precautions: Fall Precaution Comments: flexiseal condom cath Required Braces or Orthoses: Other Brace Other Brace: requested a L wrist cock up splint       Mobility Bed Mobility Overal bed mobility: Needs Assistance       Supine to sit: Mod assist Sit to supine: Mod assist   General bed mobility comments: pt able to roll to side min (A) but unable to elevate from bed surface mod  (A). pt with posterior L bias at eob. pt unable to sustain static sitting but following commands. pt with (A) for bil Le back on bed surface  Transfers Overall transfer level: Needs assistance   Transfers: Sit to/from Stand Sit to Stand: Max assist         General transfer comment: elevated surface completed sit<.stand x2 with posterior bias but able to sustain for 30 seconds    Balance Overall balance assessment: Needs assistance Sitting-balance support: Bilateral upper extremity supported;Feet supported Sitting balance-Leahy Scale: Poor     Standing balance support: Bilateral upper extremity supported;During functional activity Standing balance-Leahy Scale: Poor Standing balance comment: reliant on therapist with posterior bias                           ADL either performed or assessed with clinical judgement   ADL Overall ADL's : Needs assistance/impaired                                       General ADL Comments: pt will require total (A) for all bathin and dressing task but demonstrates (A) with basic transfer this session     Vision       Perception     Praxis      Cognition Arousal/Alertness: Awake/alert Behavior During Therapy: Flat affect                           Following Commands: Follows one step commands consistently  General Comments: pt following all commands and attempting to even correct lob . pt incorrectly direction of lob but does state he is falling. pt reports that lob is "wildEngineer, maintenance Instructions       General Comments noted to have skin flaking off on all extremities    Pertinent Vitals/ Pain       Pain Assessment: No/denies pain  Home Living                                          Prior Functioning/Environment              Frequency  Min 2X/week        Progress Toward Goals  OT Goals(current goals can now be found in the  care plan section)  Progress towards OT goals: Progressing toward goals  Acute Rehab OT Goals Patient Stated Goal: to stand OT Goal Formulation: Patient unable to participate in goal setting Time For Goal Achievement: 03/07/20 Potential to Achieve Goals: Fair ADL Goals Pt Will Perform Grooming: with mod assist;sitting Pt Will Perform Upper Body Bathing: with mod assist;sitting Pt/caregiver will Perform Home Exercise Program: Increased strength;Increased ROM;Both right and left upper extremity;With minimal assist;With written HEP provided Additional ADL Goal #1: Pt will maintain sitting balance EOB >5 min with no more than minA during ADL task. Additional ADL Goal #2: Pt will follow 2 step commands with >75% accuracy during functional task. Additional ADL Goal #3: Pt will perform bed mobility with modA+2 as precursor to EOB/OOB ADL.  Plan Discharge plan needs to be updated    Co-evaluation                 AM-PAC OT "6 Clicks" Daily Activity     Outcome Measure   Help from another person eating meals?: Total Help from another person taking care of personal grooming?: Total Help from another person toileting, which includes using toliet, bedpan, or urinal?: Total Help from another person bathing (including washing, rinsing, drying)?: Total Help from another person to put on and taking off regular upper body clothing?: Total Help from another person to put on and taking off regular lower body clothing?: Total 6 Click Score: 6    End of Session Equipment Utilized During Treatment:  (RA)  OT Visit Diagnosis: Muscle weakness (generalized) (M62.81);Other abnormalities of gait and mobility (R26.89);Other symptoms and signs involving cognitive function   Activity Tolerance Patient tolerated treatment well   Patient Left in bed;with call bell/phone within reach;with bed alarm set   Nurse Communication Mobility status;Precautions        Time: 4315-4008 OT Time Calculation  (min): 20 min  Charges: OT General Charges $OT Visit: 1 Visit OT Treatments $Self Care/Home Management : 8-22 mins $Therapeutic Activity: 8-22 mins   Bryan [name], OTR/L  Acute Rehabilitation Services Pager: [phone] Office: [phone] .    Mateo Flow 03/02/2020, 5:54 PM

## 2020-03-02 NOTE — Progress Notes (Signed)
Orthopedic Tech Progress Note Patient Details:  Bryan [name] Jun 20, 1975 [id number] Ordered brace Patient ID: [name], male   DOB: 08/12/1975, 45 y.o.   MRN: [id number]   [name] 03/02/2020, 4:55 PM

## 2020-03-03 LAB — GLUCOSE, CAPILLARY
Glucose-Capillary: 82 mg/dL (ref 70–99)
Glucose-Capillary: 107 mg/dL — ABNORMAL HIGH (ref 70–99)
Glucose-Capillary: 128 mg/dL — ABNORMAL HIGH (ref 70–99)
Glucose-Capillary: 165 mg/dL — ABNORMAL HIGH (ref 70–99)
Glucose-Capillary: 141 mg/dL — ABNORMAL HIGH (ref 70–99)

## 2020-03-03 MED ORDER — QUETIAPINE FUMARATE 50 MG PO TABS
100.0000 mg | ORAL_TABLET | Freq: Every morning | ORAL | Status: DC
  Administered 2020-03-03 – 2020-03-15 (×13): 100 mg via ORAL
  Filled 2020-03-03 (×2): qty 2
  Filled 2020-03-03 (×7): qty 1
  Filled 2020-03-03 (×4): qty 2

## 2020-03-03 MED ORDER — ACETAMINOPHEN 325 MG PO TABS
650.0000 mg | ORAL_TABLET | Freq: Four times a day (QID) | ORAL | Status: DC | PRN
  Administered 2020-03-06 – 2020-03-07 (×2): 650 mg via ORAL
  Filled 2020-03-03 (×2): qty 2

## 2020-03-03 MED ORDER — POLYETHYLENE GLYCOL 3350 17 G PO PACK: 17.0000 g | PACK | Freq: Every day | ORAL | Status: DC | PRN

## 2020-03-03 MED ORDER — PANTOPRAZOLE SODIUM 40 MG PO TBEC
40.0000 mg | DELAYED_RELEASE_TABLET | Freq: Every day | ORAL | Status: DC
  Administered 2020-03-03 – 2020-03-15 (×13): 40 mg via ORAL
  Filled 2020-03-03 (×13): qty 1

## 2020-03-03 MED ORDER — METOPROLOL TARTRATE 50 MG PO TABS
50.0000 mg | ORAL_TABLET | Freq: Two times a day (BID) | ORAL | Status: DC
  Administered 2020-03-03 – 2020-03-15 (×25): 50 mg via ORAL
  Filled 2020-03-03 (×25): qty 1

## 2020-03-03 MED ORDER — DOCUSATE SODIUM 100 MG PO CAPS
100.0000 mg | ORAL_CAPSULE | Freq: Two times a day (BID) | ORAL | Status: DC
  Administered 2020-03-03 – 2020-03-15 (×24): 100 mg via ORAL
  Filled 2020-03-03 (×25): qty 1

## 2020-03-03 MED ORDER — GUAIFENESIN 100 MG/5ML PO SOLN
5.0000 mL | ORAL | Status: DC
  Administered 2020-03-03 – 2020-03-08 (×28): 100 mg via ORAL
  Filled 2020-03-03 (×26): qty 10

## 2020-03-03 MED ORDER — QUETIAPINE FUMARATE 50 MG PO TABS
100.0000 mg | ORAL_TABLET | Freq: Every day | ORAL | Status: DC
  Administered 2020-03-03 – 2020-03-14 (×12): 100 mg via ORAL
  Filled 2020-03-03: qty 1
  Filled 2020-03-03 (×3): qty 2
  Filled 2020-03-03: qty 1
  Filled 2020-03-03 (×2): qty 2
  Filled 2020-03-03 (×5): qty 1

## 2020-03-03 NOTE — Progress Notes (Signed)
Physical Therapy Treatment Patient Details Name: Bryan [name] MRN: [id number] DOB: 1975-05-07 Today's Date: 03/03/2020    History of Present Illness Pt is a 45 y.o. man with history of schizophrenia who presented to [facility] ED on 01/25/20 with acute encephalopathy and lethargy and was admitted for DKA. He was transferred to the ICU on 11/30 with agitation and hypotension, had subsequent PEA arrest. Found to have acute pancreatitis with unclear etiology though found to have elevated triglycerides. Encephalopathy felt to be secondary to anoxia. Pt is s/p trach on 12/18 due to ventilator dependence. Subsequently spiked another fever on 12/19 with CXR findings suspicious for multifocal pneumonia    PT Comments    The pt was able to make good progress with mobility and PT goals this morning. He was able to complete multiple sit-stand transfers and tolerate seated balance task for >10 min. The pt continues to present with slowed command following and increased processing time, decreased strength, power, motor planning, and coordination. The pt will continue to benefit from skilled PT to progress these deficits in order to improve pt independence and decrease caregiver burden.     Follow Up Recommendations  SNF;Supervision/Assistance - 24 hour     Equipment Recommendations   (defer to post acute)    Recommendations for Other Services       Precautions / Restrictions Precautions Precautions: Fall Precaution Comments: flexiseal condom cath Required Braces or Orthoses: Other Brace Other Brace: requested a L wrist cock up splint Restrictions Weight Bearing Restrictions: No    Mobility  Bed Mobility Overal bed mobility: Needs Assistance Bed Mobility: Supine to Sit;Sit to Supine     Supine to sit: Mod assist Sit to supine: Mod assist   General bed mobility comments: increased time and assist as pt with poor motor planning and coordination, modA to raise trunk from [facility]  Transfers Overall  transfer level: Needs assistance Equipment used: 2 person hand held assist Transfers: Sit to/from Stand Sit to Stand: Max assist;+2 physical assistance;+2 safety/equipment         General transfer comment: maxA with L knee blocking (R knee hyperextended), tactile cues to hips for hip extension, and verbal cues for trunk and head posture. pt able to maintain for 20 seconds. completed x3  Ambulation/Gait             General Gait Details: pt unable to complete steps         Balance Overall balance assessment: Needs assistance Sitting-balance support: Bilateral upper extremity supported;Feet supported Sitting balance-Leahy Scale: Poor Sitting balance - Comments: fluctuating minG-modA through session, pt benefits from verbal cues Postural control: Posterior lean Standing balance support: Bilateral upper extremity supported;During functional activity Standing balance-Leahy Scale: Poor Standing balance comment: reliant on therapist with posterior bias                            Cognition Arousal/Alertness: Awake/alert Behavior During Therapy: Flat affect Overall Cognitive Status: Impaired/Different from baseline Area of Impairment: Attention;Following commands;Awareness;Problem solving                   Current Attention Level: Focused   Following Commands: Follows one step commands consistently;Follows one step commands with increased time   Awareness: Intellectual Problem Solving: Slow processing;Decreased initiation;Requires verbal cues;Requires tactile cues General Comments: pt following all commands with slightly increased time, does benefit from some tactile cues on first attempt for reaching/positioning. talking throughout session      Exercises General  Exercises - Lower Extremity Mini-Sqauts: AAROM;Both (x3)    General Comments General comments (skin integrity, edema, etc.): HR to 148 bpm with repeated stands      Pertinent Vitals/Pain  Pain Assessment: Faces Faces Pain Scale: Hurts little more Pain Location: bilateral feet Pain Descriptors / Indicators: Sore Pain Intervention(s): Limited activity within patient's tolerance;Monitored during session;Repositioned           PT Goals (current goals can now be found in the care plan section) Acute Rehab PT Goals Patient Stated Goal: to stand PT Goal Formulation: Patient unable to participate in goal setting Time For Goal Achievement: 03/07/20 Potential to Achieve Goals: Good Progress towards PT goals: Progressing toward goals    Frequency    Min 3X/week      PT Plan Discharge plan needs to be updated    Co-evaluation PT/OT/SLP Co-Evaluation/Treatment: Yes Reason for Co-Treatment: Complexity of the patient's impairments (multi-system involvement);Necessary to address cognition/behavior during functional activity;For patient/therapist safety;To address functional/ADL transfers PT goals addressed during session: Mobility/safety with mobility;Balance;Strengthening/ROM        AM-PAC PT "6 Clicks" Mobility   Outcome Measure  Help needed turning from your back to your side while in a flat bed without using bedrails?: A Lot Help needed moving from lying on your back to sitting on the side of a flat bed without using bedrails?: A Lot Help needed moving to and from a bed to a chair (including a wheelchair)?: Total Help needed standing up from a chair using your arms (e.g., wheelchair or bedside chair)?: A Lot Help needed to walk in hospital room?: Total Help needed climbing 3-5 steps with a railing? : Total 6 Click Score: 9    End of Session Equipment Utilized During Treatment: Gait belt Activity Tolerance: Patient tolerated treatment well;Patient limited by fatigue Patient left: in bed;with call bell/phone within reach;with bed alarm set Nurse Communication: Mobility status PT Visit Diagnosis: Unsteadiness on feet (R26.81);Difficulty in walking, not elsewhere  classified (R26.2);Muscle weakness (generalized) (M62.81)     Time: 6222-9798 PT Time Calculation (min) (ACUTE ONLY): 29 min  Charges:  $Therapeutic Activity: 8-22 mins                     Rolm Baptise, PT, DPT   Acute Rehabilitation Department Pager #: [phone]   Gaetana Michaelis 03/03/2020, 10:46 AM

## 2020-03-03 NOTE — Progress Notes (Signed)
Occupational Therapy Treatment Patient Details Name: Bryan [name] MRN: [id number] DOB: Aug 17, 1975 Today's Date: 03/03/2020    History of present illness Pt is a 45 y.o. man with history of schizophrenia who presented to [facility] ED on 01/25/20 with acute encephalopathy and lethargy and was admitted for DKA. He was transferred to the ICU on 11/30 with agitation and hypotension, had subsequent PEA arrest. Found to have acute pancreatitis with unclear etiology though found to have elevated triglycerides. Encephalopathy felt to be secondary to anoxia. Pt is s/p trach on 12/18 due to ventilator dependence. Subsequently spiked another fever on 12/19 with CXR findings suspicious for multifocal pneumonia   OT comments  This 45 yo male motivated to work with therapy today. His RUE is stronger than LUE but still unable to use either functionally; but he will try to do any task asked of him. He continues to have issues with vision and this continuing to be assessed functionally. He will continue to benefit from acute OT with follow up at SNF being best option for recovery.   Follow Up Recommendations  SNF;Supervision/Assistance - 24 hour;Other (comment) (if pt goes home will need max services)    Equipment Recommendations  3 in 1 bedside commode;Wheelchair (measurements OT);Wheelchair cushion (measurements OT);Hospital bed;Other (comment);Tub/shower bench (hoyer lift)       Precautions / Restrictions Precautions Precautions: Fall Precaution Comments: flexiseal condom cath Required Braces or Orthoses: Other Brace Other Brace: Left wrist cock up splint--per RN to be worn during day, RN asked OT to look at wrist cock up splint to make sure she had applied it properly--it was perfect. I did ask her to check the web space between thumb and index finger regularly throughout the day to make sure it was not causing any pressure issues with the strap. (of note he does have an old scar in this area  already) Restrictions Weight Bearing Restrictions: No       Mobility Bed Mobility Overal bed mobility: Needs Assistance Bed Mobility: Supine to Sit;Sit to Supine     Supine to sit: Mod assist Sit to supine: Mod assist   General bed mobility comments: increased time and assist as pt with poor motor planning and coordination, modA to raise trunk from [place] Overall transfer level: Needs assistance Equipment used: 2 person hand held assist Transfers: Sit to/from Stand Sit to Stand: Max assist;+2 physical assistance;+2 safety/equipment         General transfer comment: maxA with L knee blocking (R knee hyperextended), tactile cues to hips for hip extension, and verbal cues for trunk and head posture. pt able to maintain for 20 seconds. completed x3    Balance Overall balance assessment: Needs assistance Sitting-balance support: Bilateral upper extremity supported;Feet supported Sitting balance-Leahy Scale: Poor Sitting balance - Comments: fluctuating min guard-modA through session, pt benefits from verbal cues Postural control: Posterior lean Standing balance support: Bilateral upper extremity supported;During functional activity Standing balance-Leahy Scale: Poor Standing balance comment: reliant on therapist with posterior bias                           ADL either performed or assessed with clinical judgement   ADL Overall ADL's : Needs assistance/impaired   Eating/Feeding Details (indicate cue type and reason): He is now allowed to eat. Grooming: Total assistance Grooming Details (indicate cue type and reason): pt still not strong enough in either arm to do this by himslf  Toileting- Clothing Manipulation and Hygiene: Total assistance Toileting - Clothing Manipulation Details (indicate cue type and reason): max A +2 sit<>stand             Vision   Vision Assessment?: Vision impaired- to be further tested in  functional context Additional Comments: Pt reports vision is blurry at times and he would sometime say I had more fingers held up than I did and other times less fingers held up than I did. Had him attempt to reach with LUE or RUE while supine in bed to touch object to see if he under or over shoots, but he was unable to do this due to weaknes in UEs          Cognition Arousal/Alertness: Awake/alert Behavior During Therapy: Flat affect Overall Cognitive Status: Impaired/Different from baseline                     Current Attention Level: Focused   Following Commands: Follows one step commands consistently;Follows one step commands with increased time   Awareness: Intellectual Problem Solving: Slow processing;Decreased initiation;Requires verbal cues;Requires tactile cues General Comments: pt following all commands with slightly increased time, does benefit from some tactile cues on first attempt for reaching/positioning. Talking throughout session        Exercises Other Exercises Other Exercises: Removed wrist cock up splint and asked pt to move his wrist, he was able to extend wrist to neutral position x1 with increased effort. Could not get him to replicate this movement again when asked to do so. Wrist cock up splint reapplied. With wrist cock up splint on he can fully extend and flex his fingers.           Pertinent Vitals/ Pain       Pain Assessment: Faces Faces Pain Scale: Hurts little more Pain Location: bilateral feet Pain Intervention(s): Limited activity within patient's tolerance;Monitored during session;Repositioned         Frequency  Min 2X/week        Progress Toward Goals  OT Goals(current goals can now be found in the care plan section)  Progress towards OT goals: Progressing toward goals  Acute Rehab OT Goals Patient Stated Goal: to stand OT Goal Formulation: With patient Time For Goal Achievement: 03/07/20 Potential to Achieve Goals: Fair   Plan Discharge plan needs to be updated    Co-evaluation    PT/OT/SLP Co-Evaluation/Treatment: Yes Reason for Co-Treatment: Complexity of the patient's impairments (multi-system involvement);Necessary to address cognition/behavior during functional activity;For patient/therapist safety;To address functional/ADL transfers PT goals addressed during session: Mobility/safety with mobility;Balance;Strengthening/ROM OT goals addressed during session: Strengthening/ROM;ADL's and self-care      AM-PAC OT "6 Clicks" Daily Activity     Outcome Measure   Help from another person eating meals?: Total Help from another person taking care of personal grooming?: Total Help from another person toileting, which includes using toliet, bedpan, or urinal?: Total Help from another person bathing (including washing, rinsing, drying)?: Total Help from another person to put on and taking off regular upper body clothing?: Total Help from another person to put on and taking off regular lower body clothing?: Total 6 Click Score: 6    End of Session Equipment Utilized During Treatment: Gait belt  OT Visit Diagnosis: Muscle weakness (generalized) (M62.81);Other abnormalities of gait and mobility (R26.89);Other symptoms and signs involving cognitive function;Low vision, both eyes (H54.2)   Activity Tolerance Patient tolerated treatment well   Patient Left in bed;with call bell/phone within reach;with bed alarm  set   Nurse Communication Mobility status (wrist cock up splint good--check periodically during shift to make sure no pressure issues in web space where strap is.)        Time: 6728-9791 OT Time Calculation (min): 29 min  Charges: OT General Charges $OT Visit: 1 Visit OT Treatments $Self Care/Home Management : 8-22 mins  Bryan [name], OTR/L Acute [employer] Pager [phone] Office [phone]      Bryan [name] 03/03/2020, 7:54 PM

## 2020-03-03 NOTE — Progress Notes (Signed)
Subjective:   No acute events overnight.   Received page from RN before rounds this morning reporting patient self decannulated trach. VSS, maintaining oxygen saturations. Patient had been capped for almost 48 hours prior. Gauze applied to patient's stoma by RT.  During bedside evaluation, patient working with physical therapy. Observed patient sitting on edge of bed and stand with two-person assist. He states he is doing well. Breathing well without trach. Discussed with patient that we had discussed our recommendation for discharge to SNF prior to going home, and he expresses understanding.  Objective:  Vital signs in last 24 hours: Vitals:   03/02/20 2351 03/03/20 0414 03/03/20 0427 03/03/20 0442  BP:   121/82   Pulse:   99   Resp:      Temp: 99.6 F (37.6 C) 99 F (37.2 C)    TempSrc:      SpO2:   99%   Weight:    97.8 kg  Height:       Constitutional: ill-appearing man sitting on the edge of bed with two-person assist, in no acute distress HENT: gauze covering stoma clean/dry/intact  Cardiovascular: tachycardic in the 120s, no m/r/g Pulmonary/Chest: normal work of breathing, lungs with scattered rhonchi but otherwise clear, no wheezing; O2 sat 95% on room air Abdominal: soft, non-tender, non-distended Neurological: alert & oriented x 3, voice clear and strong, follows commands, speech is clear, still slow; LUE in brace Skin: warm and dry  Assessment/Plan: Bryan [name] is a 45 year old male with a pertinent past medical history of schizophrenia and diabetes who presented to [facility] on 01/25/2020 with acute metabolic encephalopathy secondary to DKA.  His clinical course has been complicated by acute pancreatitis resulting in significant hypotension requiring emergent intubation due to PEA arrest.  Subsequent MSSA pneumonia which is all resolved and is now recovering, saturating well on room air since 03/01/20, now post self-decannulation and off of tube feeds.  This is  hospital day 43.  Active Problems:   Schizoaffective disorder (HCC)   MDD (major depressive disorder), recurrent, severe, with psychosis (HCC)   DKA (diabetic ketoacidosis) (HCC)   Pancreatitis   Respiratory failure (HCC)   AKI (acute kidney injury) (HCC)   Dehydration   Toxic metabolic encephalopathy   Pressure injury of skin  Tracheostomy secondary to acute hypoxic respiratory failure S/p trach removal 03/03/20 Patient self decannulated his trach early this morning after trach was capped for nearly 48 hrs. On evaluation, he is breathing comfortably on room air, maintaining oxygen saturations >95%. Gauze applied to stoma by RT.  Acute metabolic encephalopathy S/p PEA cardiac arrest with anoxic brain injury Schizophrenia Cortrak removed yesterday (1/5). Doing well with dysphagia 3 diet. - Appreciate speech therapy's assistance and recommendations.  - Dysphagia 3 (Mech soft) solids, thin liquids, whole meds with puree - Continue Seroquel 100 mg BID  Sinus tachycardia Stable, thought to be secondary to stress response. - Continue metoprolol 50 mg BID    New diagnosis of diabetes presenting with DKA - Continue Levemir 40u BID - Continue Novolog 8u q4h  Decubitus ulcer on coccyx, unstageable - Continue wound care - Continue offloading pressure - Continue using pressure injury pads  Diet: Dysphagia 3 IVF: none VTE: Lovenox CODE: Full code  Prior to Admission Living Arrangement: Home PT/OT recs: SNF Anticipated Discharge Location: TBD - PT/OT recommend SNF. Per TOC conversation with patient's family, family would like him to be discharged home with home health services. Mother and patient's brothers reportedly can provide 24/7 supervision. We  feel strongly the patient would benefit from [facility] or SNF for more intensive rehabilitation. Discussed with patient's mother yesterday (1/5), and she is open to further discussion of SNF options.  Signed: Alphonzo Severance, MD  Internal  Medicine Resident, PGY-1 [facility]  Pager: [phone] 5:42 AM, 03/03/2020   After 5pm on weekdays and 1pm on weekends: On Call pager: [phone]

## 2020-03-03 NOTE — Progress Notes (Signed)
RT arrived to patients trach sitting in bed. Patient had pulled trach out. Vitals are stable on room air. Patient has been capped for almost 48 hours before. RT applied gauze to stoma. RN made aware and is contacting MD.

## 2020-03-03 NOTE — TOC Progression Note (Signed)
Transition of Care [facility]) - Progression Note    Patient Details  Name: Oryan Winterton MRN: [id number] Date of Birth: 05/07/1975  Transition of Care [facility]) CM/SW Contact  Eduard Roux, [place] Phone Number: 03/03/2020, 2:04 PM  Clinical Narrative:     CSW  Informed by RN to contact patient's mother to determine if she still wants to discharge the patient home. Again, patient's mother states patient can not afford to provide the facility with his income for the month while getting treatment at rehab. She expressed she wish she had known sooner-"his rent is due now". He still needs a place to live after this". However, she was agreeable to CSW sending SNF referral to only to [facility] and [facility]. CSW explained barriers and limited SNF availability/choices  with Medicaid insurance. CSW advised  of SNFs that generally has availability and accepts - she declined sending them SNF referral.   CSW made referral as requested and will follow up.  CSW will continue to follow and assist with discharge planning.   [name], MSW, LCSW Clinical Social Worker   Expected Discharge Plan: Home w Home Health Services Barriers to Discharge: Continued Medical Work up  Expected Discharge Plan and Services Expected Discharge Plan: Home w Home Health Services In-house Referral: Clinical Social Work Discharge Planning Services: CM Consult   Living arrangements for the past 2 months: Apartment                                       Social Determinants of Health (SDOH) Interventions    Readmission Risk Interventions No flowsheet data found.

## 2020-03-04 LAB — GLUCOSE, CAPILLARY
Glucose-Capillary: 102 mg/dL — ABNORMAL HIGH (ref 70–99)
Glucose-Capillary: 104 mg/dL — ABNORMAL HIGH (ref 70–99)
Glucose-Capillary: 107 mg/dL — ABNORMAL HIGH (ref 70–99)
Glucose-Capillary: 147 mg/dL — ABNORMAL HIGH (ref 70–99)
Glucose-Capillary: 152 mg/dL — ABNORMAL HIGH (ref 70–99)
Glucose-Capillary: 57 mg/dL — ABNORMAL LOW (ref 70–99)
Glucose-Capillary: 78 mg/dL (ref 70–99)

## 2020-03-04 MED ORDER — INSULIN ASPART 100 UNIT/ML ~~LOC~~ SOLN
0.0000 [IU] | Freq: Three times a day (TID) | SUBCUTANEOUS | Status: DC
  Administered 2020-03-05 (×3): 2 [IU] via SUBCUTANEOUS
  Administered 2020-03-06: 3 [IU] via SUBCUTANEOUS
  Administered 2020-03-06 – 2020-03-12 (×7): 2 [IU] via SUBCUTANEOUS
  Administered 2020-03-13: 3 [IU] via SUBCUTANEOUS
  Administered 2020-03-15: 2 [IU] via SUBCUTANEOUS

## 2020-03-04 MED ORDER — INSULIN ASPART 100 UNIT/ML ~~LOC~~ SOLN
8.0000 [IU] | Freq: Three times a day (TID) | SUBCUTANEOUS | Status: DC
  Administered 2020-03-05 – 2020-03-11 (×12): 8 [IU] via SUBCUTANEOUS

## 2020-03-04 NOTE — Evaluation (Signed)
Speech Language Pathology Evaluation Patient Details Name: Bryan [name] MRN: [id number] DOB: 19-Jul-1975 Today's Date: 03/04/2020 Time: 1446-1500 SLP Time Calculation (min) (ACUTE ONLY): 14 min  Problem List:  Patient Active Problem List   Diagnosis Date Noted  . Pressure injury of skin 02/16/2020  . Toxic metabolic encephalopathy   . AKI (acute kidney injury) (HCC)   . Dehydration   . Pancreatitis   . Respiratory failure (HCC)   . DKA (diabetic ketoacidosis) (HCC) 01/25/2020  . 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 Paranoid schizophrenia (HCC)   . Undifferentiated schizophrenia (HCC)   . 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 Illiterate 10/30/2011  . Schizoaffective disorder, depressive type (HCC) 10/27/2011  . Schizoaffective disorder (HCC) 08/10/2011  . Observed seizure-like activity (HCC) 08/09/2011  . PAIN IN JOINT, MULTIPLE SITES 02/16/2010  . FATIGUE 02/16/2010  . Shortness of breath 02/16/2010   Past Medical History:  Past Medical History:  Diagnosis Date  . Hypertension   . Psychiatric problem    Seen at [facility] for unknown reason. Takes seroquel. History of hearing voices.  Not commandivng voices.   . Schizophrenia (HCC)   . Tremors of nervous system    Past Surgical History:  Past Surgical History:  Procedure Laterality Date  . none    . TRACHEOSTOMY TUBE PLACEMENT N/A 02/13/2020   Procedure: TRACHEOSTOMY;  Surgeon: Newman Pies, MD;  Location: [facility];  Service: ENT;  Laterality: N/A;   HPI:  Pt is a 45 y.o. man with history of schizophrenia who presented to [facility] ED on 01/25/20 with acute encephalopathy and lethargy and was admitted for DKA. He was transferred to the ICU on 11/30 with agitation and hypotension, had subsequent PEA arrest. Patient was intubated 11/30 and trached on 12/18. Found to have acute pancreatitis with unclear etiology though found to have elevated triglycerides. Encephalopathy felt to be secondary to  anoxia. Pt is s/p trach on 12/18 due to ventilator dependence. Subsequently spiked another fever on 12/19 with CXR findings suspicious for multifocal pneumonia. [name] was downsized from #8 cuffed to #6 cuffless on 12/28.   Assessment / Plan / Recommendation Clinical Impression  Pt exhibits cognitive impairments consistent with suspected hypoxia possibly during PEA arrest. Cognitive impairments are significant in the areas of memory, problem solving, awareness, information processing and orientation to situation and time. Continue ST to increase independence.    SLP Assessment  SLP Recommendation/Assessment: Patient needs continued Speech Lanaguage Pathology Services SLP Visit Diagnosis: Cognitive communication deficit (R41.841)    Follow Up Recommendations  Skilled Nursing facility    Frequency and Duration min 2x/week  2 weeks      SLP Evaluation Cognition  Overall Cognitive Status: Impaired/Different from baseline Arousal/Alertness: Awake/alert Orientation Level: Oriented to person;Disoriented to time;Disoriented to situation;Oriented to place Attention: Sustained Sustained Attention: Appears intact Memory: Impaired Memory Impairment: Retrieval deficit Awareness: Impaired Awareness Impairment: Intellectual impairment;Emergent impairment;Anticipatory impairment Problem Solving: Impaired Problem Solving Impairment: Verbal basic;Functional basic Safety/Judgment: Impaired       Comprehension  Auditory Comprehension Overall Auditory Comprehension: Impaired Yes/No Questions: Within Functional Limits Commands: Impaired Two Step Basic Commands: 50-74% accurate Visual Recognition/Discrimination Discrimination: Exceptions to [facility] Reading Comprehension Reading Status: Not tested    Expression Expression Primary Mode of Expression: Verbal Verbal Expression Overall Verbal Expression: Impaired Initiation: No impairment Level of Generative/Spontaneous Verbalization:  Sentence Repetition:  (NT) Naming: Impairment Divergent: 0-24% accurate Pragmatics: Impairment Impairments: Eye contact Written Expression Written Expression: Not tested   Oral / Motor  Oral  Motor/Sensory Function Overall Oral Motor/Sensory Function: Within functional limits Motor Speech Overall Motor Speech: Appears within functional limits for tasks assessed   GO                    Royce Macadamia 03/04/2020, 5:25 PM  Breck Coons Lonell Face.Ed Nurse, children's [phone] Office [phone]

## 2020-03-04 NOTE — Progress Notes (Signed)
   Subjective:   No acute events overnight.   Evaluated at bedside during rounds. He is sleepier this morning but arouses easily to voice. Denies any new complaints. Denies shortness of breath, problems breathing. He slept "alright" yesterday evening. He endorses eating and drinking well.  Objective:  Vital signs in last 24 hours: Vitals:   03/03/20 2127 03/04/20 0010 03/04/20 0312 03/04/20 0500  BP: 113/80     Pulse: (!) 105     Resp:      Temp: 98.7 F (37.1 C) 98.7 F (37.1 C) 98.7 F (37.1 C)   TempSrc: Oral     SpO2:      Weight:    98.8 kg  Height:       Constitutional: ill-appearing man sitting lying in bed, in no acute distress HENT: gauze covering stoma clean/dry/intact  Cardiovascular: tachycardic in the 100s, no m/r/g Pulmonary/Chest: normal work of breathing, lungs with scattered rhonchi but otherwise clear, no wheezing; O2 sat 95% on room air Abdominal: soft, non-tender, non-distended Neurological: alert & oriented x 3, voice clear and strong, follows commands, speech is clear, still slow  Assessment/Plan: Bryan [name] is a 45 year old male with a pertinent past medical history of schizophrenia and diabetes who presented to [facility] on 01/25/2020 with acute metabolic encephalopathy secondary to DKA.  His clinical course has been complicated by acute pancreatitis resulting in significant hypotension requiring emergent intubation due to PEA arrest. Subsequent MSSA pneumonia which is all resolved and is now recovering, saturating well on room air since 03/01/20, now post self-decannulation and off of tube feeds.  This is hospital day 45.  Active Problems:   Schizoaffective disorder (HCC)   MDD (major depressive disorder), recurrent, severe, with psychosis (HCC)   DKA (diabetic ketoacidosis) (HCC)   Pancreatitis   Respiratory failure (HCC)   AKI (acute kidney injury) (HCC)   Dehydration   Toxic metabolic encephalopathy   Pressure injury of  skin  Tracheostomy secondary to acute hypoxic respiratory failure S/p trach removal 03/03/20 Patient doing well after self-removal of his trach yesterday, breathing comfortably on room air, maintaining oxygen saturations >95%. Stoma covered with gauze c/d/i.  Acute metabolic encephalopathy S/p PEA cardiac arrest with anoxic brain injury Schizophrenia Cortrak removed 1/5. Doing well with dysphagia 3 diet. - Appreciate speech therapy's assistance and recommendations.  - Dysphagia 3 (Mech soft) solids, thin liquids, whole meds with puree - Continue Seroquel 100 mg BID  New diagnosis of diabetes presenting with DKA Glucose has been well controlled on Levemir 40u BID and Novolog 8u q4h, however with recent transition off of continuous tube feeds to PO meals only, AM glucose measurements have been lower. Will adjust Novolog to [facility] dosing and consider adjustment of long-acting insulin after further monitoring. - Continue Levemir 40u BID - Novolog 8u q4h TIDWC  Sinus tachycardia - Continue metoprolol 50 mg BID    Decubitus ulcer on coccyx, unstageable - Continue wound care - Continue offloading pressure - Continue using pressure injury pads  Diet: Dysphagia 3 IVF: none VTE: Lovenox CODE: Full code  Prior to Admission Living Arrangement: Home PT/OT recs: SNF Anticipated Discharge Location: SNF - after further discussion with family, they are agreeable to broaden search of SNFs which accept Medicaid.   Signed: Alphonzo Severance, MD  Internal Medicine Resident, PGY-1 [facility]  Pager: [phone] 6:46 AM, 03/04/2020   After 5pm on weekdays and 1pm on weekends: On Call pager: [phone]

## 2020-03-04 NOTE — TOC Progression Note (Signed)
Transition of Care [facility]) - Progression Note    Patient Details  Name: Bryan [name] MRN: [id number] Date of Birth: 1975/06/01  [facility] CM/SW Contact  Carmina Miller, [place] Phone Number: 03/04/2020, 4:50 PM  Clinical Narrative:    RE: Bryan [name] Date of Birth: [birth year] Date: 03/04/2020  Please be advised that the above-named patient will require a short-term nursing home stay - anticipated 30 days or less for rehabilitation and strengthening.  The plan is for return home.   Expected Discharge Plan: Home w Home Health Services Barriers to Discharge: Continued Medical Work up  Expected Discharge Plan and Services Expected Discharge Plan: Home w Home Health Services In-house Referral: Clinical Social Work Discharge Planning Services: CM Consult   Living arrangements for the past 2 months: Apartment                                       Social Determinants of Health (SDOH) Interventions    Readmission Risk Interventions No flowsheet data found.

## 2020-03-04 NOTE — Evaluation (Signed)
Speech Language Pathology Evaluation Patient Details Name: Bryan [name] MRN: [id number] DOB: 1975/05/25 Today's Date: 03/04/2020 Time: 1446-1500 SLP Time Calculation (min) (ACUTE ONLY): 14 min  Problem List:  Patient Active Problem List   Diagnosis Date Noted  . Pressure injury of skin 02/16/2020  . Toxic metabolic encephalopathy   . AKI (acute kidney injury) (HCC)   . Dehydration   . Pancreatitis   . Respiratory failure (HCC)   . DKA (diabetic ketoacidosis) (HCC) 01/25/2020  . 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 Paranoid schizophrenia (HCC)   . Undifferentiated schizophrenia (HCC)   . 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 Illiterate 10/30/2011  . Schizoaffective disorder, depressive type (HCC) 10/27/2011  . Schizoaffective disorder (HCC) 08/10/2011  . Observed seizure-like activity (HCC) 08/09/2011  . PAIN IN JOINT, MULTIPLE SITES 02/16/2010  . FATIGUE 02/16/2010  . Shortness of breath 02/16/2010   Past Medical History:  Past Medical History:  Diagnosis Date  . Hypertension   . Psychiatric problem    Seen at [facility] for unknown reason. Takes seroquel. History of hearing voices.  Not commandivng voices.   . Schizophrenia (HCC)   . Tremors of nervous system    Past Surgical History:  Past Surgical History:  Procedure Laterality Date  . none    . TRACHEOSTOMY TUBE PLACEMENT N/A 02/13/2020   Procedure: TRACHEOSTOMY;  Surgeon: Newman Pies, MD;  Location: [facility] OR;  Service: ENT;  Laterality: N/A;   HPI:  Pt is a 45 y.o. man with history of schizophrenia who presented to [facility] ED on 01/25/20 with acute encephalopathy and lethargy and was admitted for DKA. He was transferred to the ICU on 11/30 with agitation and hypotension, had subsequent PEA arrest. Patient was intubated 11/30 and trached on 12/18. Found to have acute pancreatitis with unclear etiology though found to have elevated triglycerides. Encephalopathy felt to be secondary to  anoxia. Pt is s/p trach on 12/18 due to ventilator dependence. Subsequently spiked another fever on 12/19 with CXR findings suspicious for multifocal pneumonia. [name] was downsized from #8 cuffed to #6 cuffless on 12/28.   Assessment / Plan / Recommendation Clinical Impression  Pt exhibits cognitive impairments consistent with suspected hypoxia possibly during PEA arrest. Cognitive impairments are significant in the areas of memory, problem solving, awareness, information processing and orientation to situation and time. Continue ST to increase independence.    SLP Assessment  SLP Recommendation/Assessment: Patient needs continued Speech Lanaguage Pathology Services SLP Visit Diagnosis: Cognitive communication deficit (R41.841)    Follow Up Recommendations  Skilled Nursing facility    Frequency and Duration min 2x/week  2 weeks      SLP Evaluation Cognition  Overall Cognitive Status: Impaired/Different from baseline Arousal/Alertness: Awake/alert Orientation Level: Oriented to person;Disoriented to time;Disoriented to situation;Oriented to place Attention: Sustained Sustained Attention: Appears intact Memory: Impaired Memory Impairment: Retrieval deficit Awareness: Impaired Awareness Impairment: Intellectual impairment;Emergent impairment;Anticipatory impairment Problem Solving: Impaired Problem Solving Impairment: Verbal basic;Functional basic Safety/Judgment: Impaired       Comprehension  Auditory Comprehension Overall Auditory Comprehension: Impaired Yes/No Questions: Within Functional Limits Commands: Impaired Two Step Basic Commands: 50-74% accurate Visual Recognition/Discrimination Discrimination: Exceptions to [facility] Reading Comprehension Reading Status: Not tested    Expression Expression Primary Mode of Expression: Verbal Verbal Expression Overall Verbal Expression: Impaired Initiation: No impairment Level of Generative/Spontaneous Verbalization:  Sentence Repetition:  (NT) Naming: Impairment Divergent: 0-24% accurate Pragmatics: Impairment Impairments: Eye contact Written Expression Written Expression: Not tested   Oral / Motor  Oral  Motor/Sensory Function Overall Oral Motor/Sensory Function: Within functional limits Motor Speech Overall Motor Speech: Appears within functional limits for tasks assessed   GO                    Royce Macadamia 03/04/2020, 3:44 PM Breck Coons Lonell Face.Ed Nurse, children's [phone] Office [phone]

## 2020-03-04 NOTE — TOC Progression Note (Signed)
Transition of Care [facility]) - Progression Note    Patient Details  Name: Bryan [name] MRN: [id number] Date of Birth: 07-21-1975  [facility]) CM/SW Contact  Carmina Miller, LCSWA Phone Number: 03/04/2020, 4:08 PM  Clinical Narrative:    CSW reached out to pt's mom, updated her on the status of both SNF's declining pt. Pt's mom is now agreeable after speaking with MD to broaden the search for SNFs that accept Medicaid. CSW sent out additional offers. Pt's mom still concerned about pt not getting in a good facility. CSW advised that CSW will follow up with her with any new offers. Pt's mom states she will speak to RN about pt getting at least the first Covid vaccine to protect him while in the SNF.    Expected Discharge Plan: Home w Home Health Services Barriers to Discharge: Continued Medical Work up  Expected Discharge Plan and Services Expected Discharge Plan: Home w Home Health Services In-house Referral: Clinical Social Work Discharge Planning Services: CM Consult   Living arrangements for the past 2 months: Apartment                                       Social Determinants of Health (SDOH) Interventions    Readmission Risk Interventions No flowsheet data found.

## 2020-03-04 NOTE — NC FL2 (Signed)
[place] LEVEL OF CARE SCREENING TOOL     IDENTIFICATION  Patient Name: Bryan [name] Birthdate: Jul 31, 1975 Sex: male Admission Date (Current Location): 01/25/2020  [facility] and [place] Number:  Herbalist and Address:  [facility], [place] [address] [place], [place] [address]      Provider Number: [id number]  Attending Physician Name and Address:  Lucious Groves, DO  Relative Name and Phone Number:  Vic Blackbird [phone]    Current Level of Care: Hospital Recommended Level of Care: [place] Prior Approval Number:    Date Approved/Denied:   PASRR Number: Pending  Discharge Plan: SNF    Current Diagnoses: Patient Active Problem List   Diagnosis Date Noted  . Pressure injury of skin 02/16/2020  . Toxic metabolic encephalopathy   . AKI (acute kidney injury) ([place])   . Dehydration   . Pancreatitis   . Respiratory failure ([place])   . DKA (diabetic ketoacidosis) ([place]) 01/25/2020  . MDD (major depressive disorder), recurrent, severe, with psychosis ([place]) 03/12/2018  . Paranoid schizophrenia ([place])   . Undifferentiated schizophrenia ([place])   . Delusional disorder ([place])   . Illiterate 10/30/2011  . Schizoaffective disorder, depressive type ([place]) 10/27/2011  . Schizoaffective disorder ([place]) 08/10/2011  . Observed seizure-like activity ([place]) 08/09/2011  . PAIN IN JOINT, MULTIPLE SITES 02/16/2010  . FATIGUE 02/16/2010  . Shortness of breath 02/16/2010    Orientation RESPIRATION BLADDER Height & Weight     Self  Normal Incontinent,External catheter Weight: 217 lb 13 oz (98.8 kg) Height:  _0  (177.8 cm)  BEHAVIORAL SYMPTOMS/MOOD NEUROLOGICAL BOWEL NUTRITION STATUS      Incontinent (Rectal tube) Diet (see dc summary)  AMBULATORY STATUS COMMUNICATION OF NEEDS Skin   Extensive Assist   Other (Comment) (Pressure Injury 02/12/20 Coccyx mid stage 2, 02/23/20 Heel left deep tissue, Closed Incision neck  02/13/20, Open wound 02/06/20 MASD, Skin Tear Buttocks)                       Personal Care Assistance Level of Assistance  Bathing,Feeding,Dressing Bathing Assistance: Maximum assistance Feeding assistance: Limited assistance Dressing Assistance: Maximum assistance     Functional Limitations Info  Sight,Hearing,Speech Sight Info: Impaired Hearing Info: Adequate Speech Info: Adequate    SPECIAL CARE FACTORS FREQUENCY  OT (By licensed OT),PT (By licensed PT)     PT Frequency: 5x week OT Frequency: 5x week            Contractures Contractures Info: Not present    Additional Factors Info  Code Status,Allergies,Psychotropic,Insulin Sliding Scale Code Status Info: Full Allergies Info: Fluphenazine   Coffee Bean Extract (Coffea Arabica)   Vancomycin Psychotropic Info: QUEtiapine (SEROQUEL) tablet 100 mg daily at bedtime, QUEtiapine (SEROQUEL) tablet 100 mg at morning Insulin Sliding Scale Info: insulin aspart (novoLOG) injection 0-15 Units three times daily with meals, insulin aspart (novoLOG) injection 8 Units three times with meals, insulin detemir (LEVEMIR) injection 40 Units 2x daily       Current Medications (03/04/2020):  This is the current hospital active medication list Current Facility-Administered Medications  Medication Dose Route Frequency Provider Last Rate Last Admin  . 0.9 %  sodium chloride infusion  250 mL Intravenous Continuous Margaretha Seeds, MD 10 mL/hr at 02/08/20 1825 250 mL at 02/08/20 1825  . acetaminophen (TYLENOL) tablet 650 mg  650 mg Oral Q6H PRN Lucious Groves, DO      . chlorhexidine gluconate (MEDLINE KIT) ([name])  0.12 % solution 15 mL  15 mL Mouth Rinse BID Freddi Starr, MD   15 mL at 03/04/20 0920  . Chlorhexidine Gluconate Cloth 2 % PADS 6 each  6 each Topical Daily Freddi Starr, MD   6 each at 03/01/20 0805  . dextrose 50 % solution 0-50 mL  0-50 mL Intravenous PRN Mitzi Hansen, MD   25 mL at 02/19/20 2006  .  docusate sodium (COLACE) capsule 100 mg  100 mg Oral BID Joni Reining C, DO   100 mg at 03/04/20 3244  . enoxaparin (LOVENOX) injection 40 mg  40 mg Subcutaneous Q24H Christian, Rylee, MD   40 mg at 03/03/20 1727  . feeding supplement (ENSURE ENLIVE / ENSURE PLUS) liquid 237 mL  237 mL Oral TID BM Hoffman, Erik C, DO   237 mL at 03/04/20 1113  . fentaNYL (SUBLIMAZE) injection 25-100 mcg  25-100 mcg Intravenous Q1H PRN Frederik Pear, MD   100 mcg at 03/04/20 0309  . Gerhardt's butt cream   Topical BID Hunsucker, Bonna Gains, MD   Given at 03/04/20 1113  . guaiFENesin (ROBITUSSIN) 100 MG/5ML solution 100 mg  5 mL Oral Q4H Hoffman, Erik C, DO   100 mg at 03/04/20 1316  . insulin aspart (novoLOG) injection 0-15 Units  0-15 Units Subcutaneous TID WC Katsadouros, Vasilios, MD      . insulin aspart (novoLOG) injection 8 Units  8 Units Subcutaneous TID WC Katsadouros, Vasilios, MD      . insulin detemir (LEVEMIR) injection 40 Units  40 Units Subcutaneous BID Spero Geralds, MD   40 Units at 03/04/20 0915  . MEDLINE mouth rinse  15 mL Mouth Rinse 10 times per day Freddi Starr, MD   15 mL at 03/04/20 1408  . metoprolol tartrate (LOPRESSOR) tablet 50 mg  50 mg Oral BID Joni Reining C, DO   50 mg at 03/04/20 0914  . pantoprazole (PROTONIX) EC tablet 40 mg  40 mg Oral Daily Joni Reining C, DO   40 mg at 03/04/20 0914  . polyethylene glycol (MIRALAX / GLYCOLAX) packet 17 g  17 g Oral Daily PRN Joni Reining C, DO      . QUEtiapine (SEROQUEL) tablet 100 mg  100 mg Oral q morning - 10a Lucious Groves, DO   100 mg at 03/04/20 0914  . QUEtiapine (SEROQUEL) tablet 100 mg  100 mg Oral QHS Lucious Groves, DO   100 mg at 03/03/20 2126     Discharge Medications: Please see discharge summary for a list of discharge medications.  Relevant Imaging Results:  Relevant Lab Results:   Additional Information SSN [id number]  Loreta Ave, LCSWA

## 2020-03-04 NOTE — Progress Notes (Signed)
  Speech Language Pathology Treatment: Dysphagia  Patient Details Name: Bryan [name] MRN: [id number] DOB: 12/10/1975 Today's Date: 03/04/2020 Time: 1435-1500 SLP Time Calculation (min) (ACUTE ONLY): 10 min  Assessment / Plan / Recommendation Clinical Impression  Pt self decannualted yesterday and trach had been capped 2 days prior. No audible air was detected from stoma and assuming it is mostly patent. No s/s aspiration with thin water over multiple trials. This therapist upgraded pt to regular 1/4. Recommend continue and no further ST needed for swallow.   HPI HPI: Pt is a 45 y.o. man with history of schizophrenia who presented to [facility] ED on 01/25/20 with acute encephalopathy and lethargy and was admitted for DKA. He was transferred to the ICU on 11/30 with agitation and hypotension, had subsequent PEA arrest. Patient was intubated 11/30 and trached on 12/18. Found to have acute pancreatitis with unclear etiology though found to have elevated triglycerides. Encephalopathy felt to be secondary to anoxia. Pt is s/p trach on 12/18 due to ventilator dependence. Subsequently spiked another fever on 12/19 with CXR findings suspicious for multifocal pneumonia. Bryan [name] was downsized from #8 cuffed to #6 cuffless on 12/28.      SLP Plan  All goals met;Discharge SLP treatment due to (comment)       Recommendations  Diet recommendations: Regular;Thin liquid Liquids provided via: Cup;Straw Medication Administration: Whole meds with puree Supervision: Staff to assist with self feeding Compensations: Slow rate;Small sips/bites Postural Changes and/or Swallow Maneuvers: Seated upright 90 degrees                Oral Care Recommendations: Oral care BID Follow up Recommendations: Skilled Nursing facility SLP Visit Diagnosis: Dysphagia, unspecified (R13.10) Plan: All goals met;Discharge SLP treatment due to (comment)                       Bryan [name] 03/04/2020, 3:12 PM   Bryan [name].Ed Risk analyst [phone] Office [phone]

## 2020-03-05 LAB — GLUCOSE, CAPILLARY
Glucose-Capillary: 110 mg/dL — ABNORMAL HIGH (ref 70–99)
Glucose-Capillary: 117 mg/dL — ABNORMAL HIGH (ref 70–99)
Glucose-Capillary: 121 mg/dL — ABNORMAL HIGH (ref 70–99)
Glucose-Capillary: 122 mg/dL — ABNORMAL HIGH (ref 70–99)
Glucose-Capillary: 137 mg/dL — ABNORMAL HIGH (ref 70–99)
Glucose-Capillary: 162 mg/dL — ABNORMAL HIGH (ref 70–99)

## 2020-03-05 NOTE — Progress Notes (Addendum)
   Subjective:   No acute events overnight.   Evaluated at bedside during rounds. Patient is awake, alert, voice is clear and strong. He states he is doing "ok." Denies pain or discomfort.   Objective:  Vital signs in last 24 hours: Vitals:   03/05/20 0000 03/05/20 0010 03/05/20 0400 03/05/20 0500  BP: 100/76  112/79   Pulse: 94 94 100   Resp: 12 15 13    Temp: 99 F (37.2 C)  99 F (37.2 C)   TempSrc: Axillary  Oral   SpO2: 94% 94% 93%   Weight:    96.4 kg  Height:       Constitutional: less ill-appearing man sitting up in bed, in no acute distress, awake and alert HENT: stoma clean/dry/intact  Cardiovascular: HR in the 90s, no m/r/g Pulmonary/Chest: normal work of breathing, lungs with scattered rhonchi but otherwise clear, no wheezing; O2 sat >95% on room air Abdominal: soft, non-tender, non-distended Neurological: alert & oriented to person, place, but not time (says he is having trouble keeping the date straight), voice clear and strong, follows commands, speech is clear, less slow; much improved L-sided strength, especially at the bicep and hip  Assessment/Plan: Bryan [name] is a 45 year old male with a pertinent past medical history of schizophrenia and diabetes who presented to 56 on 01/25/2020 with acute metabolic encephalopathy secondary to DKA.  His clinical course has been complicated by acute pancreatitis resulting in significant hypotension requiring emergent intubation due to PEA arrest. Subsequent MSSA pneumonia which is all resolved and is now recovering, saturating well on room air since 03/01/20, now post self-decannulation and off of tube feeds.  This is hospital day 40.  Active Problems:   Schizoaffective disorder (HCC)   MDD (major depressive disorder), recurrent, severe, with psychosis (HCC)   DKA (diabetic ketoacidosis) (HCC)   Pancreatitis   Respiratory failure (HCC)   AKI (acute kidney injury) (HCC)   Dehydration   Toxic metabolic  encephalopathy   Pressure injury of skin   Tracheostomy secondary to acute hypoxic respiratory failure S/p decannulation 03/03/20 Patient continues to do well since self-removal of his trach, breathing comfortably on room air, maintaining oxygen saturations >95%. Stoma without gauze appears to have healthy granulation tissue without drainage or exudate.  Acute metabolic encephalopathy S/p PEA cardiac arrest with anoxic brain injury Schizophrenia Cortrak removed 1/5. Doing well with dysphagia 3 diet. - Appreciate speech therapy's assistance and recommendations.  - Dysphagia 3 (Mech soft) solids, thin liquids, whole meds with puree - Continue Seroquel 100 mg BID  New diagnosis of diabetes presenting with DKA No hypoglycemia overnight with transition to [facility] dosing of short-acting insulin.  - Continue Levemir 40u BID - Novolog 8u q4h TIDWC  Sinus tachycardia Improving, P in the 90s. - Continue metoprolol 50 mg BID    Decubitus ulcer on coccyx, unstageable - Continue wound care - Continue offloading pressure - Continue using pressure injury pads  Diet: Dysphagia 3 IVF: none VTE: Lovenox CODE: Full code  Prior to Admission Living Arrangement: Home PT/OT recs: SNF Anticipated Discharge Location: SNF pending bed availability. Will work on patient receiving first COVID vaccination.  Signed: [facility], MD  Internal Medicine Resident, PGY-1 [facility] [name] Internal Medicine Residency  Pager: [phone] 4:04 PM, 03/05/2020   After 5pm on weekdays and 1pm on weekends: On Call pager: [phone]

## 2020-03-05 NOTE — TOC Progression Note (Signed)
Transition of Care [facility]) - Progression Note    Patient Details  Name: Bryan [name] MRN: [id number] Date of Birth: 12-26-1975  Transition of Care [facility]) CM/SW Contact  Eduard Roux, [place] Phone Number: 03/05/2020, 1:52 PM  Clinical Narrative:     Patient has no bed offers. Up loaded clinicals for PASRR  [name], MSW, LCSW Clinical Social Worker   Expected Discharge Plan: Home w Home Health Services Barriers to Discharge: Continued Medical Work up  Expected Discharge Plan and Services Expected Discharge Plan: Home w Home Health Services In-house Referral: Clinical Social Work Discharge Planning Services: CM Consult   Living arrangements for the past 2 months: Apartment                                       Social Determinants of Health (SDOH) Interventions    Readmission Risk Interventions No flowsheet data found.

## 2020-03-05 NOTE — TOC Progression Note (Signed)
Transition of Care [facility]) - Progression Note    Patient Details  Name: Tia Gelb MRN: [id number] Date of Birth: 1975/06/22  Transition [facility]) CM/SW Contact  Eduard Roux, [place] Phone Number: 03/05/2020, 2:01 PM  Clinical Narrative:    Patient is has not received covid vaccine.  Called SNFs for bed offers- [facility]- closed due to covid  [facility]- no isolation rooms available  [facility]- has no availability   [facility]- sent message- no availability  [facility] - no bed available  [facility]- sent message waiting on response.    CSW will continue to follow and assist with discharge planning.  [name], MSW, LCSW Clinical Social Worker   Expected Discharge Plan: Home w Home Health Services Barriers to Discharge: Continued Medical Work up  Expected Discharge Plan and Services Expected Discharge Plan: Home w Home Health Services In-house Referral: Clinical Social Work Discharge Planning Services: CM Consult   Living arrangements for the past 2 months: Apartment                                       Social Determinants of Health (SDOH) Interventions    Readmission Risk Interventions No flowsheet data found.

## 2020-03-06 LAB — GLUCOSE, CAPILLARY
Glucose-Capillary: 119 mg/dL — ABNORMAL HIGH (ref 70–99)
Glucose-Capillary: 126 mg/dL — ABNORMAL HIGH (ref 70–99)
Glucose-Capillary: 145 mg/dL — ABNORMAL HIGH (ref 70–99)
Glucose-Capillary: 152 mg/dL — ABNORMAL HIGH (ref 70–99)
Glucose-Capillary: 180 mg/dL — ABNORMAL HIGH (ref 70–99)
Glucose-Capillary: 72 mg/dL (ref 70–99)

## 2020-03-06 MED ORDER — COVID-19 MRNA VACCINE (PFIZER) 30 MCG/0.3ML IM SUSP
0.3000 mL | Freq: Once | INTRAMUSCULAR | Status: AC
  Administered 2020-03-06: 0.3 mL via INTRAMUSCULAR
  Filled 2020-03-06: qty 0.3

## 2020-03-06 MED ORDER — COVID-19 MRNA VACC (MODERNA) 100 MCG/0.5ML IM SUSP
0.5000 mL | Freq: Once | INTRAMUSCULAR | Status: DC
  Filled 2020-03-06: qty 0.5

## 2020-03-06 NOTE — Progress Notes (Addendum)
Subjective:   No acute events overnight.   Evaluated at bedside during rounds. Patient states that he is doing well this morning, but is a little tired. He denies any symptoms at this time. All questions and concerns were addressed at bedside.   Objective:  Vital signs in last 24 hours: Vitals:   03/05/20 2000 03/06/20 0000 03/06/20 0400 03/06/20 0500  BP: 131/88 109/84    Pulse: 97 88    Resp: 15 14    Temp: 98 F (36.7 C) 99 F (37.2 C) 98.8 F (37.1 C)   TempSrc: Oral Axillary Axillary   SpO2: 95% 96%    Weight:    95.1 kg  Height:       Physical Exam Constitutional:      General: He is not in acute distress.    Appearance: He is ill-appearing. He is not toxic-appearing or diaphoretic.  Neck:     Comments: Stoma clean with no signs of erythema, purulence, drainage Cardiovascular:     Rate and Rhythm: Normal rate and regular rhythm.     Pulses: Normal pulses.     Heart sounds: Normal heart sounds. No murmur heard. No friction rub. No gallop.   Pulmonary:     Effort: Pulmonary effort is normal. No respiratory distress.     Breath sounds: Rhonchi present. No wheezing or rales.     Comments: Scattered rhonchi bilaterally Abdominal:     General: Abdomen is flat. Bowel sounds are normal.     Palpations: Abdomen is soft.     Tenderness: There is no abdominal tenderness. There is no guarding.  Neurological:     Mental Status: He is alert.     Comments: Follows commands, moves all extremities spontaneously.      Assessment/Plan: Bryan [name] is a 45 year old male with a pertinent past medical history of schizophrenia and diabetes who presented to [facility] on 01/25/2020 with acute metabolic encephalopathy secondary to DKA.  His clinical course has been complicated by acute pancreatitis resulting in significant hypotension requiring emergent intubation due to PEA arrest. Subsequent MSSA pneumonia which is all resolved and is now recovering, saturating well on room  air since 03/01/20, now post self-decannulation and off of tube feeds.  This is hospital day 41.  Active Problems:   Schizoaffective disorder (HCC)   MDD (major depressive disorder), recurrent, severe, with psychosis (HCC)   DKA (diabetic ketoacidosis) (HCC)   Pancreatitis   Respiratory failure (HCC)   AKI (acute kidney injury) (HCC)   Dehydration   Toxic metabolic encephalopathy   Pressure injury of skin   Tracheostomy secondary to acute hypoxic respiratory failure S/p decannulation 03/03/20 Patient continues to do well since self-removal of his trach, breathing comfortably on room air, maintaining oxygen saturations >95%. Stoma without gauze appears to have healthy granulation tissue without drainage or exudate.  Acute metabolic encephalopathy S/p PEA cardiac arrest with anoxic brain injury Schizophrenia Cortrak removed 1/5. Doing well with dysphagia 3 diet. - Appreciate speech therapy's assistance and recommendations.  - Dysphagia 3 (Mech soft) solids, thin liquids, whole meds with puree - Continue Seroquel 100 mg BID  New diagnosis of diabetes presenting with DKA No hypoglycemia overnight with transition to [facility] dosing of short-acting insulin.  - Continue Levemir 40u BID - Novolog 8u q4h TIDWC  Sinus tachycardia Improving, P in the 90s-110s. - Continue metoprolol 50 mg BID    Decubitus ulcer on coccyx, unstageable - Continue wound care - Continue offloading pressure - Continue using pressure injury pads  Medical Maintenance:  - Receiving first Moderna COVID-19 vaccine  Diet: Dysphagia 3 IVF: none VTE: Lovenox CODE: Full code  Prior to Admission Living Arrangement: Home PT/OT recs: SNF Anticipated Discharge Location: SNF pending bed availability. Will work on patient receiving first COVID vaccination.  Signed: Alphonzo Severance, MD  Internal Medicine Resident, PGY-1 [facility] Residency  Pager: [phone] 6:34 AM, 03/06/2020   After 5pm on  weekdays and 1pm on weekends: On Call pager: [phone]

## 2020-03-06 NOTE — TOC Progression Note (Signed)
Transition of Care [facility]) - Progression Note    Patient Details  Name: Bryan [name] MRN: [id number] Date of Birth: 03-23-1975  Transition of Care [facility]) CM/SW Contact  Annalee Genta, LCSW Phone Number: 03/06/2020, 9:32 AM  Clinical Narrative: CSW reviewed and noted patient continues to have no current bed offers.      Expected Discharge Plan: Home w Home Health Services Barriers to Discharge: Continued Medical Work up,SNF Pending bed offer (non complicance w/psych meds, HX of SA and alcohol)  Expected Discharge Plan and Services Expected Discharge Plan: Home w Home Health Services In-house Referral: Clinical Social Work Discharge Planning Services: CM Consult   Living arrangements for the past 2 months: Apartment                                       Social Determinants of Health (SDOH) Interventions    Readmission Risk Interventions No flowsheet data found.

## 2020-03-07 LAB — GLUCOSE, CAPILLARY
Glucose-Capillary: 130 mg/dL — ABNORMAL HIGH (ref 70–99)
Glucose-Capillary: 115 mg/dL — ABNORMAL HIGH (ref 70–99)
Glucose-Capillary: 111 mg/dL — ABNORMAL HIGH (ref 70–99)
Glucose-Capillary: 133 mg/dL — ABNORMAL HIGH (ref 70–99)
Glucose-Capillary: 92 mg/dL (ref 70–99)
Glucose-Capillary: 108 mg/dL — ABNORMAL HIGH (ref 70–99)

## 2020-03-07 MED ORDER — METFORMIN HCL ER 500 MG PO TB24
500.0000 mg | ORAL_TABLET | Freq: Every day | ORAL | Status: DC
  Administered 2020-03-09 – 2020-03-13 (×5): 500 mg via ORAL
  Filled 2020-03-07 (×7): qty 1

## 2020-03-07 NOTE — Progress Notes (Signed)
Inpatient Rehab Admissions Coordinator Note:   Per OT request, pt was screened for CIR candidacy by Estill Dooms, PT, DPT.  At this time we are recommending an inpatient rehab consult.  I will request an order per our protocol.  Please contact me with questions.   Estill Dooms, PT, DPT [phone] 03/07/20 4:12 PM

## 2020-03-07 NOTE — Progress Notes (Signed)
   Subjective:   No acute events overnight.   Evaluated at bedside during rounds. Patient sleeping upon examination. Patient notes he feels well, received Moderna vaccine yesterday. Counseled him regarding possible symptoms after vaccine. Denies pain around his stoma. No discomfort or concerns at this time.  Objective:  Vital signs in last 24 hours: Vitals:   03/06/20 2306 03/07/20 0400 03/07/20 0453 03/07/20 0535  BP: 126/89 130/89    Pulse: (!) 106 95    Resp: 14 15    Temp: 99.6 F (37.6 C) 98.1 F (36.7 C)    TempSrc: Oral Oral    SpO2: 94% 92%    Weight:   95.1 kg 94.2 kg  Height:       Constitutional: WDWN man lying in bed, in no acute distress, slightly diaphoretic HENT: stoma clean/dry/intact  Cardiovascular: HR in the low 100s, no m/r/g Pulmonary/Chest: normal work of breathing on room air Neurological: voice clear and strong, follows commands, speech is clear  Assessment/Plan: Bryan [name] is a 45 year old male with a pertinent past medical history of schizophrenia and diabetes who presented to [facility] on 01/25/2020 with acute metabolic encephalopathy secondary to DKA.  His clinical course has been complicated by acute pancreatitis resulting in significant hypotension requiring emergent intubation due to PEA arrest. Subsequent MSSA pneumonia which is all resolved and is now recovering, saturating well on room air since 03/01/20, now post self-decannulation and off of tube feeds.  This is hospital day 14.  Active Problems:   Schizoaffective disorder (HCC)   MDD (major depressive disorder), recurrent, severe, with psychosis (HCC)   DKA (diabetic ketoacidosis) (HCC)   Pancreatitis   Respiratory failure (HCC)   AKI (acute kidney injury) (HCC)   Dehydration   Toxic metabolic encephalopathy   Pressure injury of skin   Tracheostomy secondary to acute hypoxic respiratory failure S/p decannulation 03/03/20 Patient continues to breathe comfortably on room air,  maintaining oxygen saturations >95%. Stoma without gauze appears to have healthy granulation tissue without drainage or exudate.  Acute metabolic encephalopathy S/p PEA cardiac arrest with anoxic brain injury Schizophrenia Cortrak removed 1/5. Doing well with dysphagia 3 diet. - Appreciate speech therapy's assistance and recommendations.  - Dysphagia 3 (Mech soft) solids, thin liquids, whole meds with puree - Continue Seroquel 100 mg BID  New diagnosis of diabetes presenting with DKA No hypoglycemia overnight. AM CBG 133.  - Start metformin glucophage XR 500 mg daily - Continue Levemir 40u BID - Novolog 8u q4h TIDWC  Sinus tachycardia: Stable, P in the 90s-100s. - Continue metoprolol 50 mg BID    Decubitus ulcer on coccyx, unstageable - Continue wound care - Continue offloading pressure - Continue using pressure injury pads  Diet: Dysphagia 3 IVF: none VTE: Lovenox CODE: Full code  Prior to Admission Living Arrangement: Home PT/OT recs: SNF Anticipated Discharge Location: SNF pending bed availability. Patient received Moderna vaccine #1 yesterday (03/06/20).  Signed: Alphonzo Severance, MD  Internal Medicine Resident, PGY-1 [facility]  Pager: [phone] 6:27 AM, 03/07/2020   After 5pm on weekdays and 1pm on weekends: On Call pager: [phone]

## 2020-03-07 NOTE — Progress Notes (Signed)
Occupational Therapy Treatment Patient Details Name: Bryan [name] MRN: [id number] DOB: Mar 30, 1975 Today's Date: 03/07/2020    History of present illness Pt is a 45 y.o. man with history of schizophrenia who presented to [facility] ED on 01/25/20 with acute encephalopathy and lethargy and was admitted for DKA. He was transferred to the ICU on 11/30 with agitation and hypotension, had subsequent PEA arrest. Found to have acute pancreatitis with unclear etiology though found to have elevated triglycerides. Encephalopathy felt to be secondary to anoxia. Pt is s/p trach on 12/18 due to ventilator dependence. Subsequently spiked another fever on 12/19 with CXR findings suspicious for multifocal pneumonia   OT comments  Pt seen in conjunction with PT.  He demonstrates improved ability to assist with bed mobility, functional transfers and simple grooming, and demonstrates improved sitting balance.  He requires max A - total A for ADLs, and max A +2 for functional tranfers.  Given that family is considering taking him home and providing 24 hour care, feel a CIR stay may be beneficial to reduce burden of care and to allow him to maximize his independence.    Follow Up Recommendations  CIR (for reduced burden of care)    Equipment Recommendations  3 in 1 bedside commode;Hospital bed;Wheelchair cushion (measurements OT);Wheelchair (measurements OT)    Recommendations for Other Services Rehab consult    Precautions / Restrictions Precautions Precautions: Fall Other Brace: Left wrist cock up splint--per RN to be worn during day, RN asked OT to look at wrist cock up splint to make sure she had applied it properly--it was perfect. I did ask her to check the web space between thumb and index finger regularly throughout the day to make sure it was not causing any pressure issues with the strap. (of note he does have an old scar in this area already)       Mobility Bed Mobility Overal bed mobility: Needs  Assistance Bed Mobility: Rolling;Sidelying to Sit Rolling: Mod assist Sidelying to sit: Mod assist;+2 for physical assistance;+2 for safety/equipment       General bed mobility comments: assist to move LEs off the EOB and to lift trunk from the bed  Transfers Overall transfer level: Needs assistance Equipment used: 2 person hand held assist Transfers: Lateral/Scoot Transfers          Lateral/Scoot Transfers: Max assist;+2 safety/equipment;+2 physical assistance General transfer comment: over the back scoot transfer    Balance Overall balance assessment: Needs assistance Sitting-balance support: Feet supported;No upper extremity supported Sitting balance-Leahy Scale: Fair Sitting balance - Comments: able to maintain EOB sitting with close min guard assist                                   ADL either performed or assessed with clinical judgement   ADL Overall ADL's : Needs assistance/impaired     Grooming: Wash/dry face;Moderate assistance;Sitting Grooming Details (indicate cue type and reason): Pt requires assist to retrieve washcloth with Lt UE.  Once cloth in Lt hand, he requires mod A to wash his face as he struggled to get to the Rt side                 Toilet Transfer: Maximal assistance;+2 for safety/equipment;BSC   Toileting- Clothing Manipulation and Hygiene: Total assistance;Bed level Toileting - Clothing Manipulation Details (indicate cue type and reason): Pt incontinent of uring.  Required total A for peri care  Functional mobility during ADLs: Maximal assistance;+2 for physical assistance;+2 for safety/equipment       Vision   Additional Comments: pt unable to participate in formal visual assessment.  He reports blurriness, and has tilt head to locate items and appears to undershoot   Perception     Praxis      Cognition Arousal/Alertness: Awake/alert Behavior During Therapy: Flat affect Overall Cognitive Status:  Impaired/Different from baseline Area of Impairment: Orientation;Attention;Memory;Following commands;Safety/judgement;Awareness;Problem solving                 Orientation Level: Disoriented to;Situation;Time Current Attention Level: Sustained Memory: Decreased recall of precautions;Decreased short-term memory Following Commands: Follows one step commands consistently;Follows one step commands with increased time Safety/Judgement: Decreased awareness of safety   Problem Solving: Slow processing;Decreased initiation;Difficulty sequencing;Requires tactile cues;Requires verbal cues          Exercises     Shoulder Instructions       General Comments      Pertinent Vitals/ Pain       Pain Assessment: Faces Faces Pain Scale: No hurt  Home Living                                          Prior Functioning/Environment              Frequency  Min 2X/week        Progress Toward Goals  OT Goals(current goals can now be found in the care plan section)  Progress towards OT goals: Progressing toward goals (Pt is progressing toward goals, date extended)  Acute Rehab OT Goals Patient Stated Goal: to be able to use my arms OT Goal Formulation: With patient Time For Goal Achievement: 03/21/20 Potential to Achieve Goals: Good ADL Goals Pt Will Perform Grooming: with mod assist;sitting Pt Will Perform Upper Body Bathing: with mod assist;sitting Pt/caregiver will Perform Home Exercise Program: Increased strength;Increased ROM;Both right and left upper extremity;With minimal assist;With written HEP provided Additional ADL Goal #1: Pt will maintain sitting balance EOB >5 min with no more than minA during ADL task. Additional ADL Goal #2: Pt will follow 2 step commands with >75% accuracy during functional task. Additional ADL Goal #3: Pt will perform bed mobility with modA+2 as precursor to EOB/OOB ADL.  Plan Discharge plan needs to be updated     Co-evaluation    PT/OT/SLP Co-Evaluation/Treatment: Yes Reason for Co-Treatment: Complexity of the patient's impairments (multi-system involvement);Necessary to address cognition/behavior during functional activity;For patient/therapist safety;To address functional/ADL transfers   OT goals addressed during session: ADL's and self-care      AM-PAC OT "6 Clicks" Daily Activity     Outcome Measure   Help from another person eating meals?: Total Help from another person taking care of personal grooming?: A Lot Help from another person toileting, which includes using toliet, bedpan, or urinal?: A Lot Help from another person bathing (including washing, rinsing, drying)?: Total Help from another person to put on and taking off regular upper body clothing?: Total Help from another person to put on and taking off regular lower body clothing?: Total 6 Click Score: 8    End of Session Equipment Utilized During Treatment: Gait belt  OT Visit Diagnosis: Cognitive communication deficit (R41.841);Muscle weakness (generalized) (M62.81);Unsteadiness on feet (R26.81)   Activity Tolerance Patient tolerated treatment well   Patient Left in chair;with call bell/phone within reach;with chair alarm set  Nurse Communication Mobility status        Time: 6629-4765 OT Time Calculation (min): 33 min  Charges: OT General Charges $OT Visit: 1 Visit OT Treatments $Self Care/Home Management : 8-22 mins  Eber Jones OTR/L Acute Rehabilitation Services Pager [phone] Office [phone]    Jeani Hawking M 03/07/2020, 1:28 PM

## 2020-03-07 NOTE — Progress Notes (Signed)
Physical Therapy Treatment Patient Details Name: Bryan [name] MRN: [id number] DOB: 12/07/1975 Today's Date: 03/07/2020    History of Present Illness Pt is a 45 y.o. man with history of schizophrenia who presented to [facility] ED on 01/25/20 with acute encephalopathy and lethargy and was admitted for DKA. He was transferred to the ICU on 11/30 with agitation and hypotension, had subsequent PEA arrest. Found to have acute pancreatitis with unclear etiology though found to have elevated triglycerides. Encephalopathy felt to be secondary to anoxia. Pt is s/p trach on 12/18 due to ventilator dependence. Subsequently spiked another fever on 12/19 with CXR findings suspicious for multifocal pneumonia    PT Comments    The pt was seen for second visit to assist with transfer of pt from poor position in recliner back to bed for pt safety. The pt required maxA of 2 to boost into better position in the recliner and maxA of 2 to complete pivot/scoot from drop-arm recliner to bed. The pt continues to present with poor strength, coordination, and motor planning that impact his ability to complete transfers at this time, and will continue to benefit from intensive therapies to address these deficits prior to eventual return home to decreased caregiver burden.    Follow Up Recommendations  CIR     Equipment Recommendations  3in1 (PT);Wheelchair (measurements PT);Wheelchair cushion (measurements PT);Hospital bed (hoyer lift (if going home))    Recommendations for Other Services Rehab consult     Precautions / Restrictions Precautions Precautions: Fall Required Braces or Orthoses: Other Brace Other Brace: Left wrist cock up splint--per RN to be worn during day, RN asked OT to look at wrist cock up splint to make sure she had applied it properly--it was perfect. I did ask her to check the web space between thumb and index finger regularly throughout the day to make sure it was not causing any pressure issues  with the strap. (of note he does have an old scar in this area already) Restrictions Weight Bearing Restrictions: No    Mobility  Bed Mobility Overal bed mobility: Needs Assistance Bed Mobility: Sit to Supine Rolling: Mod assist Sidelying to sit: Mod assist;+2 for physical assistance;+2 for safety/equipment   Sit to supine: Mod assist;+2 for physical assistance   General bed mobility comments: modA to manage BLE and trunk movement to return to supine  Transfers Overall transfer level: Needs assistance Equipment used: 2 person hand held assist Transfers: Lateral/Scoot Transfers;Squat Pivot Transfers     Squat pivot transfers: Max assist;+2 physical assistance;+2 safety/equipment    Lateral/Scoot Transfers: Max assist;+2 safety/equipment;+2 physical assistance General transfer comment: maxA of 2 to complete transfer back to bed with combination of squat pivot and lateral scooting across drop arm of recliner. Pt able to complete partial squat with assist of therapist, requires assist of +2 to complete lateral movement and facilitate positioning of BLE after each movement  Ambulation/Gait             General Gait Details: deferred at this time to focus on functional lateral scoot transfer      Balance Overall balance assessment: Needs assistance Sitting-balance support: Feet supported;No upper extremity supported Sitting balance-Leahy Scale: Fair Sitting balance - Comments: able to maintain EOB sitting with close min guard assist                                    Cognition Arousal/Alertness: Awake/alert Behavior During Therapy: Flat  affect Overall Cognitive Status: Impaired/Different from baseline Area of Impairment: Orientation;Attention;Memory;Following commands;Safety/judgement;Awareness;Problem solving                 Orientation Level: Disoriented to;Situation;Time Current Attention Level: Sustained Memory: Decreased recall of  precautions;Decreased short-term memory Following Commands: Follows one step commands consistently;Follows one step commands with increased time Safety/Judgement: Decreased awareness of safety   Problem Solving: Slow processing;Decreased initiation;Difficulty sequencing;Requires tactile cues;Requires verbal cues General Comments: pt following all commands, but with poor processing and initiation on his own, requiring increased time and multimodal cues at times      Exercises      General Comments General comments (skin integrity, edema, etc.): VSS through session      Pertinent Vitals/Pain Pain Assessment: Faces Pain Score: 0-No pain Faces Pain Scale: No hurt Pain Intervention(s): Monitored during session           PT Goals (current goals can now be found in the care plan section) Acute Rehab PT Goals Patient Stated Goal: to be able to use my arms PT Goal Formulation: With patient Time For Goal Achievement: 03/21/20 Potential to Achieve Goals: Good Progress towards PT goals: Progressing toward goals    Frequency    Min 4X/week      PT Plan Current plan remains appropriate    Co-evaluation PT/OT/SLP Co-Evaluation/Treatment: Yes Reason for Co-Treatment: Complexity of the patient's impairments (multi-system involvement);Necessary to address cognition/behavior during functional activity;For patient/therapist safety;To address functional/ADL transfers PT goals addressed during session: Mobility/safety with mobility;Balance;Proper use of DME        AM-PAC PT "6 Clicks" Mobility   Outcome Measure  Help needed turning from your back to your side while in a flat bed without using bedrails?: A Lot Help needed moving from lying on your back to sitting on the side of a flat bed without using bedrails?: A Lot Help needed moving to and from a bed to a chair (including a wheelchair)?: Total Help needed standing up from a chair using your arms (e.g., wheelchair or bedside  chair)?: A Lot Help needed to walk in hospital room?: Total Help needed climbing 3-5 steps with a railing? : Total 6 Click Score: 9    End of Session Equipment Utilized During Treatment: Gait belt Activity Tolerance: Patient tolerated treatment well;Patient limited by fatigue Patient left: in bed;with call bell/phone within reach;with bed alarm set;with nursing/sitter in room Nurse Communication: Mobility status PT Visit Diagnosis: Unsteadiness on feet (R26.81);Difficulty in walking, not elsewhere classified (R26.2);Muscle weakness (generalized) (M62.81)     Time: 5188-4166 PT Time Calculation (min) (ACUTE ONLY): 11 min  Charges:  $Therapeutic Activity: 8-22 mins                     Rolm Baptise, PT, DPT   Acute Rehabilitation Department Pager #: [phone]   Gaetana Michaelis 03/07/2020, 6:28 PM

## 2020-03-07 NOTE — Progress Notes (Signed)
Physical Therapy Treatment Patient Details Name: Bryan [name] MRN: [id number] DOB: 06-24-1975 Today's Date: 03/07/2020    History of Present Illness Pt is a 45 y.o. man with history of schizophrenia who presented to [facility] ED on 01/25/20 with acute encephalopathy and lethargy and was admitted for DKA. He was transferred to the ICU on 11/30 with agitation and hypotension, had subsequent PEA arrest. Found to have acute pancreatitis with unclear etiology though found to have elevated triglycerides. Encephalopathy felt to be secondary to anoxia. Pt is s/p trach on 12/18 due to ventilator dependence. Subsequently spiked another fever on 12/19 with CXR findings suspicious for multifocal pneumonia    PT Comments    The pt was agreeable to session with focus on progressing OOB transfers and mobility with decreased need for assistance. The pt remains highly motivated and engaged in therapy session, but continues to present with deficits in strength, coordination, ROM, and motor planning that limit current ability to assist with transfers safely. The pt continues to rely on mod/maxA of 2 to complete lateral scoot and squat-pivot transfers at this time. As the pt's family has expressed they are available for 24/7 care, recommend CIR consult to evaluate for appropriateness of CIR stay to decrease caregiver burden and to maximize return of pt strength and function.    Follow Up Recommendations  CIR     Equipment Recommendations  3in1 (PT);Wheelchair (measurements PT);Wheelchair cushion (measurements PT);Hospital bed (hoyer lift, drop arm 3-in1)    Recommendations for Other Services Rehab consult     Precautions / Restrictions Precautions Precautions: Fall Other Brace: Left wrist cock up splint--per RN to be worn during day, RN asked OT to look at wrist cock up splint to make sure she had applied it properly--it was perfect. I did ask her to check the web space between thumb and index finger regularly  throughout the day to make sure it was not causing any pressure issues with the strap. (of note he does have an old scar in this area already) Restrictions Weight Bearing Restrictions: No    Mobility  Bed Mobility Overal bed mobility: Needs Assistance Bed Mobility: Rolling;Sidelying to Sit Rolling: Mod assist Sidelying to sit: Mod assist;+2 for physical assistance;+2 for safety/equipment       General bed mobility comments: assist to move LEs off the EOB and to lift trunk from the bed  Transfers Overall transfer level: Needs assistance Equipment used: 2 person hand held assist Transfers: Lateral/Scoot Transfers          Lateral/Scoot Transfers: Max assist;+2 safety/equipment;+2 physical assistance General transfer comment: over the back scoot transfer  Ambulation/Gait             General Gait Details: deferred at this time to focus on functional lateral scoot transfer       Balance Overall balance assessment: Needs assistance Sitting-balance support: Feet supported;No upper extremity supported Sitting balance-Leahy Scale: Fair Sitting balance - Comments: able to maintain EOB sitting with close min guard assist                                    Cognition Arousal/Alertness: Awake/alert Behavior During Therapy: Flat affect Overall Cognitive Status: Impaired/Different from baseline Area of Impairment: Orientation;Attention;Memory;Following commands;Safety/judgement;Awareness;Problem solving                 Orientation Level: Disoriented to;Situation;Time Current Attention Level: Sustained Memory: Decreased recall of precautions;Decreased short-term memory Following Commands: Follows  one step commands consistently;Follows one step commands with increased time Safety/Judgement: Decreased awareness of safety   Problem Solving: Slow processing;Decreased initiation;Difficulty sequencing;Requires tactile cues;Requires verbal cues General  Comments: pt following all commands, but with poor processing and initiation on his own, requiring increased time and multimodal cues at times      Exercises      General Comments General comments (skin integrity, edema, etc.): VSS through session      Pertinent Vitals/Pain Pain Assessment: Faces Pain Score: 0-No pain Faces Pain Scale: No hurt Pain Intervention(s): Monitored during session           PT Goals (current goals can now be found in the care plan section) Acute Rehab PT Goals Patient Stated Goal: to be able to use my arms PT Goal Formulation: With patient Time For Goal Achievement: 03/21/20 Potential to Achieve Goals: Good Progress towards PT goals: Progressing toward goals    Frequency    Min 4X/week      PT Plan Discharge plan needs to be updated    Co-evaluation PT/OT/SLP Co-Evaluation/Treatment: Yes Reason for Co-Treatment: Complexity of the patient's impairments (multi-system involvement);Necessary to address cognition/behavior during functional activity;For patient/therapist safety;To address functional/ADL transfers PT goals addressed during session: Mobility/safety with mobility;Balance;Proper use of DME        AM-PAC PT "6 Clicks" Mobility   Outcome Measure  Help needed turning from your back to your side while in a flat bed without using bedrails?: A Lot Help needed moving from lying on your back to sitting on the side of a flat bed without using bedrails?: A Lot Help needed moving to and from a bed to a chair (including a wheelchair)?: Total Help needed standing up from a chair using your arms (e.g., wheelchair or bedside chair)?: A Lot Help needed to walk in hospital room?: Total Help needed climbing 3-5 steps with a railing? : Total 6 Click Score: 9    End of Session Equipment Utilized During Treatment: Gait belt Activity Tolerance: Patient tolerated treatment well;Patient limited by fatigue Patient left: in chair;with chair alarm  set;with call bell/phone within reach Nurse Communication: Mobility status PT Visit Diagnosis: Unsteadiness on feet (R26.81);Difficulty in walking, not elsewhere classified (R26.2);Muscle weakness (generalized) (M62.81)     Time: 6837-2902 PT Time Calculation (min) (ACUTE ONLY): 32 min  Charges:  $Therapeutic Activity: 8-22 mins                     Rolm Baptise, PT, DPT   Acute Rehabilitation Department Pager #: [phone]   Gaetana Michaelis 03/07/2020, 3:30 PM

## 2020-03-08 LAB — GLUCOSE, CAPILLARY
Glucose-Capillary: 101 mg/dL — ABNORMAL HIGH (ref 70–99)
Glucose-Capillary: 108 mg/dL — ABNORMAL HIGH (ref 70–99)
Glucose-Capillary: 134 mg/dL — ABNORMAL HIGH (ref 70–99)
Glucose-Capillary: 200 mg/dL — ABNORMAL HIGH (ref 70–99)
Glucose-Capillary: 88 mg/dL (ref 70–99)
Glucose-Capillary: 92 mg/dL (ref 70–99)
Glucose-Capillary: 98 mg/dL (ref 70–99)

## 2020-03-08 NOTE — Progress Notes (Signed)
Inpatient Rehab Admissions Coordinator:   I spoke with pt.'s mother and she confirmed interest in CIR for Pt. And states that she and Pt.'s brother can provide 24/7 support at d/c.  Megan Salon, MS, CCC-SLP Rehab Admissions Coordinator  [phone] (celll) [phone] (office)

## 2020-03-08 NOTE — PMR Pre-admission (Signed)
PMR Admission Coordinator Pre-Admission Assessment  Patient: Bryan [name] is an 45 y.o., male MRN: [id number] DOB: June 02, 1975 Height: _0  (177.8 cm) Weight: 91.6 kg  Insurance Information HMO:     PPO:      PCP:      IPA:      80/20:      OTHER:  PRIMARY: Medicaid [place] Access      Policy#: [id number] m  Verified online 03/15/20     Subscriber: Pt.  CM Name:     Phone#:      Fax#:  Pre-Cert#:       Employer:  Benefits:  Phone #:      Name:  Eff. Date: currently in effect       Deduct:       Out of Pocket Max:       Life Max:  CIR: covered per medicaid guidelines   SNF:  Outpatient:     Co-Pay:  Home Health:       Co-Pay:  DME:      Co-Pay:  Providers:  SECONDARY: None       Policy#:      Phone#:   The "Data Collection Information Summary" for patients in Inpatient Rehabilitation Facilities with attached "Privacy Act [place] Records" was provided and verbally reviewed with: Family  Emergency Contact Information Contact Information    Name Relation Home Work McGregor Mother [phone]     Sarina Ser [phone]        Current Medical History  Patient Admitting Diagnosis: DKA History of Present Illness: Pt is a 45 y.o. man with history of schizophrenia who presented to [facility] ED on 01/25/20 with acute encephalopathy and lethargy and was admitted for DKA. He was transferred to the ICU on 11/30 with agitation and hypotension, had subsequent PEA arrest. Found to have acute pancreatitis with unclear etiology though found to have elevated triglycerides. Encephalopathy felt to be secondary to anoxia. Pt is s/p trach on 12/18 due to ventilator dependence, decannulated 03/03/20. Subsequently spiked another fever on 12/19 with CXR findings suspicious for multifocal pneumonia.     Patient's medical record from [facility]  has been reviewed by the rehabilitation admission coordinator and physician.  Past Medical History  Past  Medical History:  Diagnosis Date  . Hypertension   . Psychiatric problem    Seen at [facility] for unknown reason. Takes seroquel. History of hearing voices.  Not commandivng voices.   . Schizophrenia ([place])   . Tremors of nervous system     Family History   family history is not on file.  Prior Rehab/Hospitalizations Has the patient had prior rehab or hospitalizations prior to admission? Yes  Has the patient had major surgery during 100 days prior to admission? Yes   Current Medications  Current Facility-Administered Medications:  .  (feeding supplement) PROSource Plus liquid 30 mL, 30 mL, Oral, TID BM, Hoffman, Erik C, DO, 30 mL at 03/15/20 1049 .  acetaminophen (TYLENOL) tablet 650 mg, 650 mg, Oral, Q6H PRN, Lucious Groves, DO, 650 mg at 03/07/20 2150 .  docusate sodium (COLACE) capsule 100 mg, 100 mg, Oral, BID, Hoffman, Erik C, DO, 100 mg at 03/15/20 1051 .  enoxaparin (LOVENOX) injection 40 mg, 40 mg, Subcutaneous, Q24H, Katsadouros, Vasilios, MD, 40 mg at 03/14/20 1735 .  feeding supplement (ENSURE ENLIVE / ENSURE PLUS) liquid 237 mL, 237 mL, Oral, TID BM, Hoffman, Erik C, DO, 237 mL at 03/15/20 1052 .  Gerhardt's butt cream, ,  Topical, BID, Riesa Pope, MD, Given at 03/15/20 1049 .  insulin aspart (novoLOG) injection 0-15 Units, 0-15 Units, Subcutaneous, TID WC, Katsadouros, Vasilios, MD, 3 Units at 03/13/20 1658 .  insulin aspart (novoLOG) injection 5 Units, 5 Units, Subcutaneous, TID WC, Lucious Groves, DO, 5 Units at 03/14/20 1735 .  insulin detemir (LEVEMIR) injection 30 Units, 30 Units, Subcutaneous, Daily, Lucious Groves, DO, 30 Units at 03/14/20 7341 .  metFORMIN (GLUCOPHAGE-XR) 24 hr tablet 500 mg, 500 mg, Oral, BID WC, Alexandria Lodge, MD, 500 mg at 03/14/20 1735 .  metoprolol tartrate (LOPRESSOR) tablet 50 mg, 50 mg, Oral, BID, Hoffman, Erik C, DO, 50 mg at 03/15/20 1051 .  multivitamin with minerals tablet 1 tablet, 1 tablet, Oral, Daily, Lucious Groves, DO,  1 tablet at 03/15/20 1051 .  pantoprazole (PROTONIX) EC tablet 40 mg, 40 mg, Oral, Daily, Hoffman, Erik C, DO, 40 mg at 03/15/20 1051 .  polyethylene glycol (MIRALAX / GLYCOLAX) packet 17 g, 17 g, Oral, Daily PRN, Heber , Erik C, DO .  QUEtiapine (SEROQUEL) tablet 100 mg, 100 mg, Oral, q morning - 10a, Hoffman, Erik C, DO, 100 mg at 03/15/20 1051 .  QUEtiapine (SEROQUEL) tablet 100 mg, 100 mg, Oral, QHS, Lucious Groves, DO, 100 mg at 03/14/20 2106  Patients Current Diet:  Diet Order            Diet - low sodium heart healthy           Diet regular Room service appropriate? Yes; Fluid consistency: Thin  Diet effective now                 Precautions / Restrictions Precautions Precautions: Fall Precaution Comments: condom cath Other Brace: L wrist cock up splint to be worn during day per RN (not donned during session) Restrictions Weight Bearing Restrictions: No   Has the patient had 2 or more falls or a fall with injury in the past year? Yes  Prior Activity Level    Prior Functional Level Self Care: Did the patient need help bathing, dressing, using the toilet or eating? Independent  Indoor Mobility: Did the patient need assistance with walking from room to room (with or without device)? Independent  Stairs: Did the patient need assistance with internal or external stairs (with or without device)? Independent  Functional Cognition: Did the patient need help planning regular tasks such as shopping or remembering to take medications? Needed some help  Home Assistive Devices / [place] Devices/Equipment: None  Prior Device Use: Indicate devices/aids used by the patient prior to current illness, exacerbation or injury? None of the above  Current Functional Level Cognition  Arousal/Alertness: Awake/alert Overall Cognitive Status: Impaired/Different from baseline Current Attention Level: Sustained Orientation Level: Oriented to person,Oriented to  place Following Commands: Follows one step commands consistently,Follows multi-step commands inconsistently Safety/Judgement: Decreased awareness of safety,Decreased awareness of deficits General Comments: Flat affect, when PT asked why pt is in the hospital pt states "because I wasn't taking care of myself". Pt requires increased time to follow commands, but does so 100% of the time today. Motivated to get stronger Attention: Sustained Sustained Attention: Appears intact Memory: Impaired Memory Impairment: Retrieval deficit Awareness: Impaired Awareness Impairment: Intellectual impairment,Emergent impairment,Anticipatory impairment Problem Solving: Impaired Problem Solving Impairment: Verbal basic,Functional basic Safety/Judgment: Impaired    Extremity Assessment (includes Sensation/Coordination)  Upper Extremity Assessment: RUE deficits/detail,LUE deficits/detail RUE Deficits / Details: atatic movements. difficulty using functionally. RUE Coordination: decreased fine motor LUE Deficits / Details: atatic  movement. difficulty moving functionally LUE Coordination: decreased fine motor,decreased gross motor  Lower Extremity Assessment: Defer to PT evaluation RLE Deficits / Details: pt with minimal volutnary movement, increased tone/stiffness LLE Deficits / Details: minimal voluntary movement, increased tone/stiffness    ADLs  Overall ADL's : Needs assistance/impaired Eating/Feeding: NPO Eating/Feeding Details (indicate cue type and reason): He is now allowed to eat. Grooming: Wash/dry face,Wash/dry hands,Sitting,Min guard Grooming Details (indicate cue type and reason): ataxia, tremors noted. Upper Body Dressing : Moderate assistance,Sitting Toilet Transfer: Maximal assistance,+2 for safety/equipment,BSC Toileting- Clothing Manipulation and Hygiene: Total assistance,Bed level Toileting - Clothing Manipulation Details (indicate cue type and reason): Pt incontinent of uring.  Required  total A for peri care Functional mobility during ADLs: Moderate assistance,Rolling walker General ADL Comments: pt will require total (A) for all bathin and dressing task but demonstrates (A) with basic transfer this session    Mobility  Overal bed mobility: Needs Assistance Bed Mobility: Supine to Sit,Sit to Supine Rolling: Min guard Sidelying to sit: Mod assist Supine to sit: Min assist Sit to supine: Min assist General bed mobility comments: min assist for supine<>sit for trunk and LE management, assist for boost up in bed upon return to supine.    Transfers  Overall transfer level: Needs assistance Equipment used: Rolling walker (2 wheeled) Transfer via Lift Equipment: Stedy Transfers: Sit to/from Stand Sit to Stand: Mod assist Stand pivot transfers: Mod assist Squat pivot transfers: Max assist,+2 physical assistance,+2 safety/equipment  Lateral/Scoot Transfers: Max assist,+2 safety/equipment,+2 physical assistance General transfer comment: Mod assist for power up, steady, physically placing hands on RW. Verbal cuing for hand placement when rising/sitting, tremulous and ataxic once standing not improved with weighted input through pt shoulders. STS x3 from EOB.    Ambulation / Gait / Stairs / Wheelchair Mobility  Ambulation/Gait Ambulation/Gait assistance: Mod assist,+2 physical assistance Gait Distance (Feet): 4 Feet (4'x2) Assistive device: Rolling walker (2 wheeled) Gait Pattern/deviations: Step-to pattern,Decreased stride length,Ataxic General Gait Details: lateral stepping and forward stepping only; pregait activity requiring mod assist for weight shifting, physically translating LEs, and steadying Gait velocity: decreased    Posture / Balance Dynamic Sitting Balance Sitting balance - Comments: Pt able to sit at EOB with close supervision but did require min A at times due to posterior lean Balance Overall balance assessment: Needs assistance Sitting-balance support: No  upper extremity supported Sitting balance-Leahy Scale: Fair Sitting balance - Comments: Pt able to sit at EOB with close supervision but did require min A at times due to posterior lean Postural control: Posterior lean Standing balance support: Bilateral upper extremity supported Standing balance-Leahy Scale: Poor Standing balance comment: reiles on RW    Special needs/care consideration Diabetic management New diabetic, manged with sQ Novologand Levemir    and Special service needs Pt. with dx of schizophrenia, well managed for years with antipsychotics. Has Left wrist cock-up splint.   Previous Home Environment (from acute therapy documentation) Living Arrangements: Alone  Lives With: Alone Available Help at Discharge: Family Type of Home: Apartment Home Layout: One level Home Access: Stairs to enter Entrance Stairs-Rails: Right Entrance Stairs-Number of Steps: 16 Bathroom Shower/Tub: Chiropodist: Standard Bathroom Accessibility: Yes How Accessible: Accessible via walker [place]: No Additional Comments: per chart pt was living with brothers PTA, pt currently unable to provide home setup.  Discharge Living Setting Plans for Discharge Living Setting: House Type of Home at Discharge: House Discharge Home Layout: One level Discharge Home Access: Stairs to enter Entrance Stairs-Rails: None  Entrance Stairs-Number of Steps: 2 Discharge Bathroom Shower/Tub: Tub/shower unit Discharge Bathroom Toilet: Standard Discharge Bathroom Accessibility: No Does the patient have any problems obtaining your medications?: No  Social/Family/Support Systems Patient Roles: Parent Contact Information: [phone] Anticipated Caregiver: Vic Blackbird (Mother) Anticipated Caregiver's Contact Information: [phone] Ability/Limitations of Caregiver: can provide min-mod A Caregiver Availability: 24/7 Discharge Plan Discussed with Primary Caregiver: Yes Is Caregiver  In Agreement with Plan?: Yes Does Caregiver/Family have Issues with Lodging/Transportation while Pt is in Rehab?: No  Goals Patient/Family Goal for Rehab: PT/OT/SLP Min A Expected length of stay: 14-18 days Pt/Family Agrees to Admission and willing to participate: Yes Program Orientation Provided & Reviewed with Pt/Caregiver Including Roles  & Responsibilities: Yes  Decrease burden of Care through IP rehab admission: Specialzed equipment needs, Diet advancement, Decrease number of caregivers, Bowel and bladder program and Patient/family education  Possible need for SNF placement upon discharge: not stated  Patient Condition: I have reviewed medical records from [facility] , spoken with CM, and patient and family member. I met with patient at the bedside for inpatient rehabilitation assessment.  Patient will benefit from ongoing PT, OT and SLP, can actively participate in 3 hours of therapy a day 5 days of the week, and can make measurable gains during the admission.  Patient will also benefit from the coordinated team approach during an Inpatient Acute Rehabilitation admission.  The patient will receive intensive therapy as well as Rehabilitation physician, nursing, social worker, and care management interventions.  Due to bladder management, safety, skin/wound care, disease management, medication administration, pain management and patient education the patient requires 24 hour a day rehabilitation nursing.  The patient is currently min A to  Mod A with mobility and basic ADLs.  Discharge setting and therapy post discharge at home with home health is anticipated.  Patient has agreed to participate in the Acute Inpatient Rehabilitation Program and will admit today.  Preadmission Screen Completed By:  Genella Mech, 03/15/2020 10:57 AM ______________________________________________________________________   Discussed status with Dr. Naaman Plummer  on 03/15/20 at 88 and received approval  for admission today.  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 Does the need for close, 24 hr/day Medical supervision in concert with the patient's rehab needs make it unreasonable for this patient to be served in a less intensive setting? Yes 2. Co-Morbidities requiring supervision/potential complications: acute pancreatitis, trach, pneumonia 3. Due to bladder management, bowel management, safety, skin/wound care, disease management, medication administration, pain management and patient education, does the patient require 24 hr/day rehab nursing? Yes 4. Does the patient require coordinated care of a physician, rehab nurse, PT, OT, and SLP to address physical and functional deficits in the context of the above medical diagnosis(es)? Yes Addressing deficits in the following areas: balance, endurance, locomotion, strength, transferring, bowel/bladder control, bathing, dressing, feeding, grooming, toileting, cognition and psychosocial support 5. Can the patient actively participate in an intensive therapy program of at least 3 hrs of therapy 5 days a week? Yes 6. The potential for patient to make measurable gains while on inpatient rehab is excellent 7. Anticipated functional outcomes upon discharge from inpatient rehab: min assist PT, min assist OT, min assist SLP 8. Estimated rehab length of stay to reach the above functional goals is: 14-18 days 9. Anticipated discharge destination: Home 10. Overall Rehab/Functional Prognosis: excellent   MD Signature: Meredith Staggers, MD, [facility] 03/15/2020

## 2020-03-08 NOTE — Progress Notes (Signed)
Inpatient Rehab Admissions Coordinator:   I met with Pt. At bedside to discuss CIR admit. Pt. Is interested but will need to confirm support at d/c.  Clemens Catholic, [place], [place] Admissions Coordinator  [phone] ([place]) [phone] (office)

## 2020-03-08 NOTE — Progress Notes (Signed)
Physical Therapy Treatment Patient Details Name: Bryan [name] MRN: [id number] DOB: 1975/10/14 Today's Date: 03/08/2020    History of Present Illness Pt is a 45 y.o. man with history of schizophrenia who presented to [facility] ED on 01/25/20 with acute encephalopathy and lethargy and was admitted for DKA. He was transferred to the ICU on 11/30 with agitation and hypotension, had subsequent PEA arrest. Found to have acute pancreatitis with unclear etiology though found to have elevated triglycerides. Encephalopathy felt to be secondary to anoxia. Pt is s/p trach on 12/18 due to ventilator dependence. Subsequently spiked another fever on 12/19 with CXR findings suspicious for multifocal pneumonia.     PT Comments    The pt was able to demo good progress with mobility and strengthening at this time. He was able to complete multiple sit-stand transfers with maxA and use of stedy to complete transfer from elevated bed. The pt continues to present with deficits in stability, motor control, coordination, and strength that require increased physical assist to power up, steady, and assist that allows for increased time with all mobility. The pt will continue to benefit from skilled PT to progress functional strength, endurance, and mobility, and continues to be great candidate for CIR level therapies.    Follow Up Recommendations  CIR     Equipment Recommendations  3in1 (PT);Wheelchair (measurements PT);Wheelchair cushion (measurements PT);Hospital bed (hoyer lift (if going home))    Recommendations for Other Services       Precautions / Restrictions Precautions Precautions: Fall Required Braces or Orthoses: Other Brace Other Brace: Left wrist cock up splint--per RN to be worn during day, RN asked OT to look at wrist cock up splint to make sure she had applied it properly--it was perfect. I did ask her to check the web space between thumb and index finger regularly throughout the day to make sure it was  not causing any pressure issues with the strap. (of note he does have an old scar in this area already) Restrictions Weight Bearing Restrictions: No    Mobility  Bed Mobility Overal bed mobility: Needs Assistance Bed Mobility: Sit to Supine Rolling: Mod assist Sidelying to sit: Mod assist;+2 for physical assistance;+2 for safety/equipment   Sit to supine: Mod assist;+2 for physical assistance   General bed mobility comments: modA to manage BLE and trunk movement to return to supine. modA to steady in sitting with verbal cues and increased time to allow for pt to reach and steady with RUE  Transfers Overall transfer level: Needs assistance Equipment used: Ambulation equipment used Transfers: Sit to/from [employer] Sit to Stand: Max assist;+2 physical assistance;+2 safety/equipment Stand pivot transfers: Total assist (with stedy)       General transfer comment: maxA to power up with stedy, mod/minA to steady in standing with BUE supported. significant shaking/wobbles despite UE support, requires seated rest after 3-5 seconds. completed x4 from elevated bed, x6 from stedy flaps  Ambulation/Gait             General Gait Details: deferred at this time to fucs on functional strength          Balance Overall balance assessment: Needs assistance Sitting-balance support: Feet supported;No upper extremity supported Sitting balance-Leahy Scale: Fair Sitting balance - Comments: benefits from single UE support or modA Postural control:  (all directions, poor correction) Standing balance support: Bilateral upper extremity supported;During functional activity Standing balance-Leahy Scale: Poor Standing balance comment: reliant on BUE support and maxA  Cognition Arousal/Alertness: Awake/alert Behavior During Therapy: Flat affect Overall Cognitive Status: Impaired/Different from baseline Area of Impairment:  Attention;Memory;Following commands;Safety/judgement;Awareness;Problem solving                   Current Attention Level: Sustained Memory: Decreased recall of precautions;Decreased short-term memory Following Commands: Follows one step commands inconsistently;Follows one step commands with increased time Safety/Judgement: Decreased awareness of safety Awareness: Intellectual Problem Solving: Slow processing;Decreased initiation;Difficulty sequencing;Requires tactile cues;Requires verbal cues General Comments: pt making attempt to follow all commands, but with poor processing, motor control, and dysmetria requiring increased assist and time for all movement      Exercises Other Exercises Other Exercises: Sit-stand from elevated bed x5, sit-stand from stedy flaps x6.    General Comments General comments (skin integrity, edema, etc.): HR to 137 with standing activity, returned to 120s with seated rest      Pertinent Vitals/Pain Pain Assessment: Faces Faces Pain Scale: Hurts a little bit Pain Location: L arm Pain Descriptors / Indicators: Sore Pain Intervention(s): Monitored during session;Limited activity within patient's tolerance;Repositioned           PT Goals (current goals can now be found in the care plan section) Acute Rehab PT Goals Patient Stated Goal: to be able to use my arms PT Goal Formulation: With patient Time For Goal Achievement: 03/21/20 Potential to Achieve Goals: Good Progress towards PT goals: Progressing toward goals    Frequency    Min 4X/week      PT Plan Current plan remains appropriate       AM-PAC PT "6 Clicks" Mobility   Outcome Measure  Help needed turning from your back to your side while in a flat bed without using bedrails?: A Lot Help needed moving from lying on your back to sitting on the side of a flat bed without using bedrails?: A Lot Help needed moving to and from a bed to a chair (including a wheelchair)?: Total Help  needed standing up from a chair using your arms (e.g., wheelchair or bedside chair)?: A Lot Help needed to walk in hospital room?: Total Help needed climbing 3-5 steps with a railing? : Total 6 Click Score: 9    End of Session Equipment Utilized During Treatment: Gait belt Activity Tolerance: Patient tolerated treatment well;Patient limited by fatigue Patient left: in bed;with call bell/phone within reach;with bed alarm set;with nursing/sitter in room Nurse Communication: Mobility status PT Visit Diagnosis: Unsteadiness on feet (R26.81);Difficulty in walking, not elsewhere classified (R26.2);Muscle weakness (generalized) (M62.81)     Time: 4650-3546 PT Time Calculation (min) (ACUTE ONLY): 31 min  Charges:  $Therapeutic Exercise: 8-22 mins $Therapeutic Activity: 8-22 mins                     Rolm Baptise, PT, DPT   Acute Rehabilitation Department Pager #: [phone]   Gaetana Michaelis 03/08/2020, 12:44 PM

## 2020-03-08 NOTE — Progress Notes (Addendum)
   Subjective:   No acute events overnight.   Evaluated at bedside during rounds. Nursing note patient has had decreased appetite over the past 2 days after receiving his first COVID vaccine. Patient states he has felt more tired and has had less of an appetite. Endorses R arm soreness at the site of the vaccine injection. Encouraged the patient to try to eat despite decreased appetite. He requests help changing his diaper because he had recently had a BM.  Objective:  Vital signs in last 24 hours: Vitals:   03/07/20 2150 03/08/20 0032 03/08/20 0412 03/08/20 0500  BP:  114/81 126/84   Pulse: (!) 111 96 96   Resp:   13   Temp:  98 F (36.7 C) 99.1 F (37.3 C)   TempSrc:  Oral Oral   SpO2:  97% 95%   Weight:    94.2 kg  Height:       Constitutional: WDWN man lying in bed, in no acute distress HENT: stoma clean/dry/intact  Cardiovascular: HR in the low 100s, no m/r/g Pulmonary/Chest: normal work of breathing on room air Extremities: tenderness to palpation over the lateral aspect of the R deltoid Neurological: voice clear and strong, speech is clear  Assessment/Plan: Bryan [name] is a 45 year old male with a pertinent past medical history of schizophrenia and diabetes who presented to [facility] on 01/25/2020 with acute metabolic encephalopathy secondary to DKA.  His clinical course has been complicated by acute pancreatitis resulting in significant hypotension requiring emergent intubation due to PEA arrest. Subsequent MSSA pneumonia which is all resolved and is now recovering, saturating well on room air since 03/01/20, now post self-decannulation and off of tube feeds.  This is hospital day 56.  Active Problems:   Schizoaffective disorder (HCC)   MDD (major depressive disorder), recurrent, severe, with psychosis (HCC)   DKA (diabetic ketoacidosis) (HCC)   Pancreatitis   Respiratory failure (HCC)   AKI (acute kidney injury) (HCC)   Dehydration   Toxic metabolic  encephalopathy   Pressure injury of skin   Tracheostomy secondary to acute hypoxic respiratory failure S/p decannulation 03/03/20 Patient continues to breathe comfortably on room air, maintaining oxygen saturations >95%. Stoma healing nicely without drainage or exudate.  Acute metabolic encephalopathy S/p PEA cardiac arrest with anoxic brain injury Schizophrenia Cortrak removed 1/5. Dysphagia 3 diet. - Appreciate speech therapy's assistance and recommendations.  - Seroquel 100 mg BID  New diagnosis of diabetes presenting with DKA No hypoglycemia overnight. Patient with decreased PO intake since receiving Pfizer vaccine 2 days ago. - Start metformin glucophage XR 500 mg daily - Continue Levemir 40u BID - Novolog 8u TIDWC  Sinus tachycardia: Stable, P in the 90s-100s. - Continue metoprolol 50 mg BID    Decubitus ulcer on coccyx, unstageable - Continue wound care - Continue offloading pressure - Continue using pressure injury pads  Diet: Dysphagia 3 IVF: none VTE: Lovenox CODE: Full code  Prior to Admission Living Arrangement: Home PT/OT recs: CIR Anticipated Discharge Location: CIR. Patient received Pfizer vaccine #1 on 03/06/20.  Signed: Alphonzo Severance, MD  Internal Medicine Resident, PGY-1 [facility]  Pager: [phone] 7:07 AM, 03/08/2020   After 5pm on weekdays and 1pm on weekends: On Call pager: [phone]

## 2020-03-09 LAB — CBC WITH DIFFERENTIAL/PLATELET
Abs Immature Granulocytes: 0.02 10*3/uL (ref 0.00–0.07)
Basophils Absolute: 0 10*3/uL (ref 0.0–0.1)
Basophils Relative: 1 %
Eosinophils Absolute: 0.1 10*3/uL (ref 0.0–0.5)
Eosinophils Relative: 2 %
HCT: 36.9 % — ABNORMAL LOW (ref 39.0–52.0)
Hemoglobin: 11.9 g/dL — ABNORMAL LOW (ref 13.0–17.0)
Immature Granulocytes: 0 %
Lymphocytes Relative: 23 %
Lymphs Abs: 1.6 10*3/uL (ref 0.7–4.0)
MCH: 30.1 pg (ref 26.0–34.0)
MCHC: 32.2 g/dL (ref 30.0–36.0)
MCV: 93.4 fL (ref 80.0–100.0)
Monocytes Absolute: 0.8 10*3/uL (ref 0.1–1.0)
Monocytes Relative: 11 %
Neutro Abs: 4.5 10*3/uL (ref 1.7–7.7)
Neutrophils Relative %: 63 %
Platelets: 308 10*3/uL (ref 150–400)
RBC: 3.95 MIL/uL — ABNORMAL LOW (ref 4.22–5.81)
RDW: 14.7 % (ref 11.5–15.5)
WBC: 7.1 10*3/uL (ref 4.0–10.5)
nRBC: 0 % (ref 0.0–0.2)

## 2020-03-09 LAB — BASIC METABOLIC PANEL
Anion gap: 11 (ref 5–15)
BUN: 9 mg/dL (ref 6–20)
CO2: 26 mmol/L (ref 22–32)
Calcium: 10.3 mg/dL (ref 8.9–10.3)
Chloride: 99 mmol/L (ref 98–111)
Creatinine, Ser: 0.79 mg/dL (ref 0.61–1.24)
GFR, Estimated: >60 mL/min (ref >60)
Glucose, Bld: 87 mg/dL (ref 70–99)
Potassium: 3.5 mmol/L (ref 3.5–5.1)
Sodium: 136 mmol/L (ref 135–145)

## 2020-03-09 LAB — GLUCOSE, CAPILLARY
Glucose-Capillary: 78 mg/dL (ref 70–99)
Glucose-Capillary: 110 mg/dL — ABNORMAL HIGH (ref 70–99)
Glucose-Capillary: 132 mg/dL — ABNORMAL HIGH (ref 70–99)
Glucose-Capillary: 106 mg/dL — ABNORMAL HIGH (ref 70–99)
Glucose-Capillary: 129 mg/dL — ABNORMAL HIGH (ref 70–99)

## 2020-03-09 MED ORDER — PROSOURCE PLUS PO LIQD
30.0000 mL | Freq: Three times a day (TID) | ORAL | Status: DC
  Administered 2020-03-09 – 2020-03-15 (×15): 30 mL via ORAL
  Filled 2020-03-09 (×16): qty 30

## 2020-03-09 MED ORDER — INSULIN DETEMIR 100 UNIT/ML ~~LOC~~ SOLN
35.0000 [IU] | Freq: Two times a day (BID) | SUBCUTANEOUS | Status: DC
  Administered 2020-03-09 – 2020-03-11 (×3): 35 [IU] via SUBCUTANEOUS
  Filled 2020-03-09 (×5): qty 0.35

## 2020-03-09 NOTE — Progress Notes (Signed)
Subjective:   O/N: No acute events  Evaluated at bedside during rounds. Sleeping in bed comfortbly. He states he is eating more, but still does not have an appetite. Denies any pain, shortness of breath. He received a bath today, and is doing well.   Objective:  Vital signs in last 24 hours: Vitals:   03/08/20 2307 03/09/20 0400 03/09/20 0420 03/09/20 0434  BP: 109/89  (!) 119/91   Pulse: 99     Resp: 20 20    Temp: 98.6 F (37 C)  98 F (36.7 C)   TempSrc: Oral  Oral   SpO2: 94%     Weight:    94.2 kg  Height:       Physical Exam Constitutional:      General: He is not in acute distress.    Appearance: Normal appearance. He is not ill-appearing.  Cardiovascular:     Rate and Rhythm: Tachycardia present.     Pulses: Normal pulses.     Heart sounds: Normal heart sounds. No murmur heard. No gallop.      Comments: Tachycrdic>103 Pulmonary:     Effort: Pulmonary effort is normal.     Breath sounds: Normal breath sounds. No wheezing, rhonchi or rales.  Abdominal:     General: Abdomen is flat. Bowel sounds are normal.     Tenderness: There is no abdominal tenderness. There is no guarding.  Neurological:     Mental Status: He is alert.  Psychiatric:        Behavior: Behavior normal.     Assessment/Plan: Bryan [name] is a 45 year old male with a pertinent past medical history of schizophrenia and diabetes who presented to [facility] on 01/25/2020 with acute metabolic encephalopathy secondary to DKA.  His clinical course has been complicated by acute pancreatitis resulting in significant hypotension requiring emergent intubation due to PEA arrest. Subsequent MSSA pneumonia which is all resolved and is now recovering, saturating well on room air since 03/01/20, now post self-decannulation and off of tube feeds.  This is hospital day 44.  Active Problems:   Schizoaffective disorder (HCC)   MDD (major depressive disorder), recurrent, severe, with psychosis (HCC)   DKA  (diabetic ketoacidosis) (HCC)   Pancreatitis   Respiratory failure (HCC)   AKI (acute kidney injury) (HCC)   Dehydration   Toxic metabolic encephalopathy   Pressure injury of skin   Tracheostomy secondary to acute hypoxic respiratory failure S/p decannulation 03/03/20 Patient continues to breathe comfortably on room air, maintaining oxygen saturations >95%. Well healing stoma with no drainage or exudate.  Acute metabolic encephalopathy S/p PEA cardiac arrest with anoxic brain injury Schizophrenia Cortrak removed 03/03/19.  - Dysphagia 3 diet. - Appreciate speech therapy's assistance and recommendations.  - Seroquel 100 mg BID  New diagnosis of diabetes presenting with DKA No hypoglycemia overnight. Patient with decreased PO intake since receiving Pfizer vaccine 2 days ago. - Continue metformin glucophage XR 500 mg daily - Continue Levemir 40u BID - Novolog 8u TIDWC  Sinus tachycardia:  - Continue metoprolol 50 mg BID    Decubitus ulcer on coccyx, unstageable - Continue wound care - Continue offloading pressure - Continue using pressure injury pads  Diet: Dysphagia 3 IVF: none VTE: Lovenox CODE: Full code  Prior to Admission Living Arrangement: Home PT/OT recs: CIR Anticipated Discharge Location: CIR. Patient received Pfizer vaccine #1 on 03/06/20.  Signed: Dolan Amen, MD  Internal Medicine Resident, PGY-2 [facility]  Pager: [phone] 6:30 AM, 03/09/2020  After 5pm on weekdays and 1pm on weekends: On Call pager: [phone]

## 2020-03-09 NOTE — Progress Notes (Signed)
Physical Therapy Treatment Patient Details Name: Bryan [name] MRN: [id number] DOB: 23-Jan-1976 Today's Date: 03/09/2020    History of Present Illness Pt is a 45 y.o. man with history of schizophrenia who presented to [facility] ED on 01/25/20 with acute encephalopathy and lethargy and was admitted for DKA. He was transferred to the ICU on 11/30 with agitation and hypotension, had subsequent PEA arrest. Found to have acute pancreatitis with unclear etiology though found to have elevated triglycerides. Encephalopathy felt to be secondary to anoxia. Pt is s/p trach on 12/18 due to ventilator dependence. Subsequently spiked another fever on 12/19 with CXR findings suspicious for multifocal pneumonia    PT Comments    Pt confused on arrival with painful feet, calling out to nursing.  Covered new, painful skin with mepilex before donning socks.  Emphasis on transitions/bridging to EOB, sitting balance, sit to stand, pregait with HHA and in the RW, scoot transfers up toward [facility] and general standing tolerance.  Pt displayed significant truncal and extremity ataxia, both open and closed chain.  Pt only able to step or pivot either foot minimally given maximal stability assist.     Follow Up Recommendations  CIR     Equipment Recommendations  3in1 (PT);Wheelchair (measurements PT);Wheelchair cushion (measurements PT);Hospital bed    Recommendations for Other Services Rehab consult     Precautions / Restrictions Precautions Precautions: Fall Precaution Comments: condom cath Required Braces or Orthoses: Other Brace Other Brace: Left wrist cock up splint--per RN to be worn during day, RN asked OT to look at wrist cock up splint to make sure she had applied it properly--it was perfect. I did ask her to check the web space between thumb and index finger regularly throughout the day to make sure it was not causing any pressure issues with the strap. (of note he does have an old scar in this area already)     Mobility  Bed Mobility Overal bed mobility: Needs Assistance Bed Mobility: Supine to Sit;Sit to Supine Rolling: Mod assist   Supine to sit: Mod assist Sit to supine: Mod assist;+2 for physical assistance   General bed mobility comments: cues for direction, truncal assist fup and forward. Pt having difficulty assisting with UE's.  Assisted bridge to EOB.  Transfers Overall transfer level: Needs assistance Equipment used: 1 person hand held assist;Rolling walker (2 wheeled) Transfers: Sit to/from Stand Sit to Stand: Max assist         General transfer comment: sit to stand x3  2x's with no AD and 1X with RW.  Cues for hand placement and assist for forward and significant boost.  Ambulation/Gait             General Gait Details: pregait only   Stairs             Wheelchair Mobility    Modified Rankin (Stroke Patients Only)       Balance Overall balance assessment: Needs assistance Sitting-balance support: Feet supported;Single extremity supported;No upper extremity supported Sitting balance-Leahy Scale: Fair Sitting balance - Comments: able to sit with UE assist, but only within BOS   Standing balance support: Single extremity supported;Bilateral upper extremity supported Standing balance-Leahy Scale: Poor Standing balance comment: sit to stand x3.  Worked for ~ 2 min each trial on w/shifting, standing in midline, closing BOS, stepping forward, back and side to side with each foot.  Side-stepping the most difficult.  Pt displayed significant truncal and limb ataxia, appearing most prevalent in the left LE.  Cognition Arousal/Alertness: Awake/alert Behavior During Therapy: Flat affect Overall Cognitive Status: Impaired/Different from baseline Area of Impairment: Attention;Memory;Following commands;Safety/judgement;Awareness;Problem solving                 Orientation Level: Situation;Time Current Attention  Level: Sustained Memory: Decreased recall of precautions;Decreased short-term memory Following Commands: Follows one step commands inconsistently;Follows one step commands with increased time Safety/Judgement: Decreased awareness of safety;Decreased awareness of deficits Awareness: Intellectual Problem Solving: Slow processing;Decreased initiation;Difficulty sequencing;Requires tactile cues;Requires verbal cues        Exercises      General Comments        Pertinent Vitals/Pain Pain Assessment: Faces Faces Pain Scale: Hurts little more Pain Location: L heel Pain Descriptors / Indicators: Sore Pain Intervention(s): Monitored during session;Other (comment) (redress wound)    Home Living                      Prior Function            PT Goals (current goals can now be found in the care plan section) Acute Rehab PT Goals Patient Stated Goal: to be able to use my arms PT Goal Formulation: With patient Time For Goal Achievement: 03/21/20 Potential to Achieve Goals: Good Progress towards PT goals: Progressing toward goals    Frequency           PT Plan Current plan remains appropriate    Co-evaluation              AM-PAC PT "6 Clicks" Mobility   Outcome Measure  Help needed turning from your back to your side while in a flat bed without using bedrails?: A Lot Help needed moving from lying on your back to sitting on the side of a flat bed without using bedrails?: A Lot Help needed moving to and from a bed to a chair (including a wheelchair)?: A Lot Help needed standing up from a chair using your arms (e.g., wheelchair or bedside chair)?: A Lot Help needed to walk in hospital room?: Total Help needed climbing 3-5 steps with a railing? : Total 6 Click Score: 10    End of Session   Activity Tolerance: Patient tolerated treatment well;Patient limited by fatigue Patient left: in bed;with call bell/phone within reach;with bed alarm set;with  nursing/sitter in room Nurse Communication: Mobility status       Time: 1610-9604 PT Time Calculation (min) (ACUTE ONLY): 24 min  Charges:  $Therapeutic Activity: 23-37 mins                     03/09/2020  Jacinto Halim., PT Acute Rehabilitation Services [phone]  (pager) [phone]  (office)   Eliseo Gum Liyana Suniga 03/09/2020, 5:53 PM

## 2020-03-09 NOTE — Progress Notes (Signed)
Nutrition Follow-up  DOCUMENTATION CODES:   Not applicable  INTERVENTION:  Provide Ensure Enlive po TID, each supplement provides 350 kcal and 20 grams of protein.  Provide 30 ml Prosource Plus po TID, each supplement provides 100 kcal and 15 grams of protein.   Encourage adequate PO intake.   NUTRITION DIAGNOSIS:   Increased nutrient needs related to acute illness,wound healing as evidenced by estimated needs; ongoing  GOAL:   Patient will meet greater than or equal to 90% of their needs; progressing  MONITOR:   PO intake,Supplement acceptance,Skin,Weight trends,Labs,I & O's  REASON FOR ASSESSMENT:   Consult Enteral/tube feeding initiation and management (trickle tube feeds)  ASSESSMENT:   45 yo male admitted with AMS, new onset DM with DKA. PMH includes MDD, paranoid schizophrenia, HTN, smoker, drug use (cocaine, marijuana), heavy alcohol use.  S/P Cortrak placement 12/17, tip in the stomach. S/P tracheostomy 12/18. On trach collar since 12/20. Cortrak NGT removed, tube feeding discontinued 1/5.   Trach decannulated 1/6.  Pt continues on a regular diet with thin liquids. Meal completion has been varied from 20-100%. Pt with decreased appetite most recently. Pt currently has Ensure ordered and has been consuming them. RD to continue with current orders as well as additionally order Prosource plus to aid in adequate nutrition. Pt encouraged to eat his food at meals and to consume his supplements.   Labs and medications reviewed.   Diet Order:   Diet Order            Diet regular Room service appropriate? Yes; Fluid consistency: Thin  Diet effective now                 EDUCATION NEEDS:   Not appropriate for education at this time  Skin:  Skin Assessment: Reviewed RN Assessment Skin Integrity Issues:: Stage II,Other (Comment) Stage II: coccyx (12/17) Other: MASD and skin tear to buttocks  Last BM:  1/11 rectal tube 200 ml output  Height:   Ht Readings  from Last 1 Encounters:  01/25/20 5\' 10"  (1.778 m)    Weight:   Wt Readings from Last 1 Encounters:  03/09/20 94.2 kg    BMI:  Body mass index is 29.8 kg/m.  Estimated Nutritional Needs:   Kcal:  2200-2400  Protein:  130-150 gm  Fluid:  2.5 L  05/07/20, MS, RD, LDN RD pager number/after hours weekend pager number on Amion.

## 2020-03-09 NOTE — Progress Notes (Signed)
  Speech Language Pathology Treatment: Cognitive-Linquistic  Patient Details Name: Bryan [name] MRN: [id number] DOB: 10-16-1975 Today's Date: 03/09/2020 Time: 0240-9735 SLP Time Calculation (min) (ACUTE ONLY): 23 min  Assessment / Plan / Recommendation Clinical Impression  Cognitive treatment focused on problem solving, orientation and awareness. Questions beyond basic biographical or physical state, he frequently responds "I don't know." He recalled his birth month and day but not year or age. With therapist providing maximum cues to determine year of birth over period of 5 min, he was unable. He cannot accurately visualize and count therapist fingers in line of sight or calendar. He is intellectually aware of poor balance and walking but needed cueing for cognition. He will need continued cognitive treatment to maximize potential for improvement.  SLP not following pt for swallow now- lunch at bedside he stated he wasn't hungry. Therapist encouraged him to try bite hamburger (appetite has been low per chart) which he began to [facility] then immediately gagged and expectorated. Stated his stomach is "shaky".     HPI HPI: Pt is a 45 y.o. man with history of schizophrenia who presented to [facility] ED on 01/25/20 with acute encephalopathy and lethargy and was admitted for DKA. He was transferred to the ICU on 11/30 with agitation and hypotension, had subsequent PEA arrest. Patient was intubated 11/30 and trached on 12/18. Found to have acute pancreatitis with unclear etiology though found to have elevated triglycerides. Encephalopathy felt to be secondary to anoxia. Pt is s/p trach on 12/18 due to ventilator dependence. Subsequently spiked another fever on 12/19 with CXR findings suspicious for multifocal pneumonia. [name] was downsized from #8 cuffed to #6 cuffless on 12/28.      SLP Plan  Continue with current plan of care       Recommendations                   Oral Care Recommendations:  Oral care BID Follow up Recommendations: Skilled Nursing facility SLP Visit Diagnosis: Cognitive communication deficit (H29.924) Plan: Continue with current plan of care                      Royce Macadamia 03/09/2020, 12:40 PM Breck Coons Lonell Face.Ed Nurse, children's [phone] Office [phone]

## 2020-03-09 NOTE — Progress Notes (Signed)
Pt transferred to 6N27 via bed. VS stable. Pt A&Ox2. Belongings and documents (including COVID vaccination card) at bedside. Mother notified of room change.

## 2020-03-10 LAB — GLUCOSE, CAPILLARY
Glucose-Capillary: 107 mg/dL — ABNORMAL HIGH (ref 70–99)
Glucose-Capillary: 117 mg/dL — ABNORMAL HIGH (ref 70–99)
Glucose-Capillary: 149 mg/dL — ABNORMAL HIGH (ref 70–99)
Glucose-Capillary: 63 mg/dL — ABNORMAL LOW (ref 70–99)
Glucose-Capillary: 97 mg/dL (ref 70–99)

## 2020-03-10 NOTE — Progress Notes (Signed)
Physical Therapy Treatment Patient Details Name: Bryan [name] MRN: [id number] DOB: 11-15-1975 Today's Date: 03/10/2020    History of Present Illness Pt is a 45 y.o. man with history of schizophrenia who presented to [facility] ED on 01/25/20 with acute encephalopathy and lethargy and was admitted for DKA. He was transferred to the ICU on 11/30 with agitation and hypotension, had subsequent PEA arrest. Found to have acute pancreatitis with unclear etiology though found to have elevated triglycerides. Encephalopathy felt to be secondary to anoxia. Pt is s/p trach on 12/18 due to ventilator dependence. Subsequently spiked another fever on 12/19 with CXR findings suspicious for multifocal pneumonia    PT Comments    Session limited by fatigue and nausea. Patient requires modA for supine>sit and modA+2 for sit>supine. Patient able to perform seated reaching activities with minA to maintain sitting balance, demos undershooting and difficulty reaching above 90 degrees. Patient continues to be limited by impaired cognition, generalized weakness, decreased activity tolerance, impaired balance, and impaired functional mobility. Continue to recommend comprehensive inpatient rehab (CIR) for post-acute therapy needs.     Follow Up Recommendations  CIR     Equipment Recommendations  3in1 (PT);Wheelchair (measurements PT);Wheelchair cushion (measurements PT);Hospital bed    Recommendations for Other Services Rehab consult     Precautions / Restrictions Precautions Precautions: Fall Precaution Comments: condom cath Required Braces or Orthoses: Other Brace Other Brace: Left wrist cock up splint--per RN to be worn during day, RN asked OT to look at wrist cock up splint to make sure she had applied it properly--it was perfect. I did ask her to check the web space between thumb and index finger regularly throughout the day to make sure it was not causing any pressure issues with the strap. (of note he does have  an old scar in this area already) Restrictions Weight Bearing Restrictions: No    Mobility  Bed Mobility Overal bed mobility: Needs Assistance Bed Mobility: Supine to Sit;Sit to Supine     Supine to sit: Mod assist Sit to supine: Mod assist;+2 for physical assistance;+2 for safety/equipment   General bed mobility comments: cues for sequencing. Assist for trunk advancement.  Transfers                    Ambulation/Gait                 Stairs             Wheelchair Mobility    Modified Rankin (Stroke Patients Only)       Balance Overall balance assessment: Needs assistance Sitting-balance support: Feet supported;Single extremity supported Sitting balance-Leahy Scale: Fair Sitting balance - Comments: performed seated reaching activities with patient able to maintain sitting balance with minA                                    Cognition Arousal/Alertness: Awake/alert Behavior During Therapy: Flat affect Overall Cognitive Status: Impaired/Different from baseline Area of Impairment: Attention;Memory;Following commands;Safety/judgement;Awareness;Problem solving                 Orientation Level: Situation;Time Current Attention Level: Sustained Memory: Decreased recall of precautions;Decreased short-term memory Following Commands: Follows one step commands inconsistently;Follows one step commands with increased time Safety/Judgement: Decreased awareness of safety;Decreased awareness of deficits Awareness: Intellectual Problem Solving: Slow processing;Decreased initiation;Difficulty sequencing;Requires tactile cues;Requires verbal cues General Comments: Pt. needed max clues to stay awake and engage in therapy  Exercises General Exercises - Upper Extremity Shoulder Flexion: PROM;Both;10 reps Elbow Flexion: AAROM;Both;20 reps;Supine Elbow Extension: AAROM;Both;20 reps;Supine Digit Composite Flexion: AAROM;20  reps;Supine Composite Extension: AAROM;15 reps;Supine Other Exercises Other Exercises: Seated reaching tasks with minA to maintain balance. Patient demos undershooting and difficulty reaching above 90 degrees of shoulder flexion    General Comments        Pertinent Vitals/Pain Pain Assessment: Faces Faces Pain Scale: No hurt    Home Living                      Prior Function            PT Goals (current goals can now be found in the care plan section) Acute Rehab PT Goals Patient Stated Goal: to be able to use my arms PT Goal Formulation: With patient Time For Goal Achievement: 03/21/20 Potential to Achieve Goals: Good Progress towards PT goals: Progressing toward goals    Frequency    Min 4X/week      PT Plan Current plan remains appropriate    Co-evaluation              AM-PAC PT "6 Clicks" Mobility   Outcome Measure  Help needed turning from your back to your side while in a flat bed without using bedrails?: A Lot Help needed moving from lying on your back to sitting on the side of a flat bed without using bedrails?: A Lot Help needed moving to and from a bed to a chair (including a wheelchair)?: A Lot Help needed standing up from a chair using your arms (e.g., wheelchair or bedside chair)?: A Lot Help needed to walk in hospital room?: Total Help needed climbing 3-5 steps with a railing? : Total 6 Click Score: 10    End of Session   Activity Tolerance: Patient limited by fatigue Patient left: in bed;with call bell/phone within reach;with bed alarm set Nurse Communication: Mobility status PT Visit Diagnosis: Unsteadiness on feet (R26.81);Difficulty in walking, not elsewhere classified (R26.2);Muscle weakness (generalized) (M62.81)     Time: 7829-5621 PT Time Calculation (min) (ACUTE ONLY): 24 min  Charges:  $Therapeutic Activity: 23-37 mins                     Eirik Schueler A. Dan Humphreys PT, DPT Acute Rehabilitation Services Pager  [phone] Office [phone]    Elissa Lovett 03/10/2020, 11:51 AM

## 2020-03-10 NOTE — Progress Notes (Signed)
Received patient for care @22 :30. Patient alert, oriented to person, disoriented to time and situation. Settled into room, assessment completed, orders reviewed. Will continue to monitor according to orders and plan of care.

## 2020-03-10 NOTE — Progress Notes (Signed)
Occupational Therapy Treatment Patient Details Name: Bryan [name] MRN: [id number] DOB: May 04, 1975 Today's Date: 03/10/2020    History of present illness Pt is a 45 y.o. man with history of schizophrenia who presented to [facility] ED on 01/25/20 with acute encephalopathy and lethargy and was admitted for DKA. He was transferred to the ICU on 11/30 with agitation and hypotension, had subsequent PEA arrest. Found to have acute pancreatitis with unclear etiology though found to have elevated triglycerides. Encephalopathy felt to be secondary to anoxia. Pt is s/p trach on 12/18 due to ventilator dependence. Subsequently spiked another fever on 12/19 with CXR findings suspicious for multifocal pneumonia   OT comments  Pt. Seen for skilled OT to maximize I and safety with ADLs. Pt. Required hand over hand assist for ADLs in supine. Pt. Was very letharic and difficult to keep awake. Pt. Performed AAROM for B UE all joints for 20 reps times 2 sets. Pt. Had wrist splint reapplied. Per previous note he is to wear during the day.   Follow Up Recommendations  CIR;SNF    Equipment Recommendations  3 in 1 bedside commode;Hospital bed;Wheelchair cushion (measurements OT);Wheelchair (measurements OT)    Recommendations for Other Services      Precautions / Restrictions Precautions Precautions: Fall Precaution Comments: condom cath Other Brace: Left wrist cock up splint--per RN to be worn during day, RN asked OT to look at wrist cock up splint to make sure she had applied it properly--it was perfect. I did ask her to check the web space between thumb and index finger regularly throughout the day to make sure it was not causing any pressure issues with the strap. (of note he does have an old scar in this area already)       Mobility Bed Mobility                  Transfers                      Balance                                           ADL either performed or  assessed with clinical judgement   ADL Overall ADL's : Needs assistance/impaired     Grooming: Wash/dry face;Wash/dry hands;Maximal assistance;Bed level Grooming Details (indicate cue type and reason): hand over hand assist         Upper Body Dressing : Bed level;Total assistance                           Vision       Perception     Praxis      Cognition Arousal/Alertness: Lethargic Behavior During Therapy: Flat affect Overall Cognitive Status: Impaired/Different from baseline Area of Impairment: Attention;Memory;Following commands;Safety/judgement;Awareness;Problem solving                               General Comments: Pt. needed max clues to stay awake and engage in therapy        Exercises General Exercises - Upper Extremity Shoulder Flexion: AAROM;Both;20 reps;Supine Elbow Flexion: AAROM;Both;20 reps;Supine Elbow Extension: AAROM;Both;20 reps;Supine Digit Composite Flexion: AAROM;20 reps;Supine Composite Extension: AAROM;15 reps;Supine   Shoulder Instructions       General Comments      Pertinent Vitals/  Pain       Pain Assessment: No/denies pain  Home Living                                          Prior Functioning/Environment              Frequency  Min 2X/week        Progress Toward Goals  OT Goals(current goals can now be found in the care plan section)  Progress towards OT goals: Progressing toward goals  Acute Rehab OT Goals Patient Stated Goal: none stated OT Goal Formulation: With patient Time For Goal Achievement: 03/21/20 Potential to Achieve Goals: Good ADL Goals Pt Will Perform Grooming: with mod assist;sitting Pt Will Perform Upper Body Bathing: with mod assist;sitting Pt/caregiver will Perform Home Exercise Program: Increased strength;Increased ROM;Both right and left upper extremity;With minimal assist;With written HEP provided Additional ADL Goal #1: Pt will maintain sitting  balance EOB >5 min with no more than minA during ADL task. Additional ADL Goal #2: Pt will follow 2 step commands with >75% accuracy during functional task. Additional ADL Goal #3: Pt will perform bed mobility with modA+2 as precursor to EOB/OOB ADL.  Plan      Co-evaluation                 AM-PAC OT "6 Clicks" Daily Activity     Outcome Measure   Help from another person eating meals?: Total Help from another person taking care of personal grooming?: A Lot Help from another person toileting, which includes using toliet, bedpan, or urinal?: Total Help from another person bathing (including washing, rinsing, drying)?: Total Help from another person to put on and taking off regular upper body clothing?: Total Help from another person to put on and taking off regular lower body clothing?: Total 6 Click Score: 7    End of Session    OT Visit Diagnosis: Cognitive communication deficit (R41.841);Muscle weakness (generalized) (M62.81);Unsteadiness on feet (R26.81)   Activity Tolerance Patient limited by lethargy   Patient Left in bed;with call bell/phone within reach;with bed alarm set   Nurse Communication  (ok for therapy)        Time: 7846-9629 OT Time Calculation (min): 30 min  Charges: OT General Charges $OT Visit: 1 Visit OT Treatments $Self Care/Home Management : 8-22 mins $Therapeutic Exercise: 8-22 mins  Derrek Gu OT/L   Arden Axon 03/10/2020, 11:49 AM

## 2020-03-10 NOTE — Progress Notes (Signed)
Subjective:   O/N: Transferred out of the neuro progressive.   Bryan [name] was seen and evaluated at bedside this AM. He states that he is doing well today, but continues to not have an appetite. Per bedside RN, he has not ordered breakfast this AM, but he states that he will attempt to eat this afternoon.   Objective:  Vital signs in last 24 hours: Vitals:   03/09/20 1300 03/09/20 1945 03/09/20 2236 03/10/20 0517  BP: 131/86 (!) 125/95 (!) 116/93 119/89  Pulse: 88 (!) 112 (!) 106 (!) 103  Resp: 14 14 16 17   Temp: 98 F (36.7 C) 98.5 F (36.9 C) 98.7 F (37.1 C) 98.5 F (36.9 C)  TempSrc: Oral Oral Oral Oral  SpO2: 96% 96% 93% 95%  Weight:      Height:       Physical Exam Constitutional:      General: He is not in acute distress.    Comments: Resting comfortably in bed  HENT:     Head: Normocephalic and atraumatic.  Neck:     Comments: Well healing stoma, no purulence, drainage, or erythema.  Cardiovascular:     Rate and Rhythm: Tachycardia present.     Pulses: Normal pulses.     Heart sounds: Normal heart sounds. No murmur heard. No friction rub. No gallop.   Pulmonary:     Effort: Pulmonary effort is normal.     Breath sounds: Normal breath sounds. No wheezing, rhonchi or rales.  Abdominal:     General: Abdomen is flat. Bowel sounds are normal.     Tenderness: There is no abdominal tenderness.  Neurological:     Mental Status: He is alert.  Psychiatric:        Mood and Affect: Mood normal.        Behavior: Behavior normal.     Assessment/Plan: Bryan [name] is a 45 year old male with a pertinent past medical history of schizophrenia and diabetes who presented to 56 on 01/25/2020 with acute metabolic encephalopathy secondary to DKA.  His clinical course has been complicated by acute pancreatitis resulting in significant hypotension requiring emergent intubation due to PEA arrest. Subsequent MSSA pneumonia which is all resolved and is now recovering,  saturating well on room air since 03/01/20, now post self-decannulation and off of tube feeds.  This is hospital day 45.  Active Problems:   Schizoaffective disorder (HCC)   MDD (major depressive disorder), recurrent, severe, with psychosis (HCC)   DKA (diabetic ketoacidosis) (HCC)   Pancreatitis   Respiratory failure (HCC)   AKI (acute kidney injury) (HCC)   Dehydration   Toxic metabolic encephalopathy   Pressure injury of skin   Tracheostomy secondary to acute hypoxic respiratory failure S/p decannulation 03/03/20 Patient continues to breathe comfortably on room air, maintaining oxygen saturations >95%. Well healing stoma with no drainage or exudate.  Acute metabolic encephalopathy S/p PEA cardiac arrest with anoxic brain injury Schizophrenia Cortrak removed 03/03/19. Patient continues to endorse lack of appetite, there is concern that this may be due to his schizophrenia, but will continue to monitor.   - Dysphagia 3 diet. - Appreciate SLP recommendations.   - Seroquel 100 mg BID  New diagnosis of diabetes presenting with DKA No hypoglycemia overnight. - Continue metformin glucophage XR 500 mg daily - Continue Levemir 40u BID - Novolog 8u TIDWC  Sinus tachycardia:  - Continue metoprolol 50 mg BID    Decubitus ulcer on coccyx, unstageable - Continue wound care - Continue offloading pressure -  Continue using pressure injury pads  Diet: Dysphagia 3 IVF: none VTE: Lovenox CODE: Full code  Prior to Admission Living Arrangement: Home PT/OT recs: CIR Anticipated Discharge Location: CIR. Patient received Pfizer vaccine #1 on 03/06/20.  Signed: Dolan Amen, MD  Internal Medicine Resident, PGY-2 [facility]  Pager: [phone] 2:23 PM, 03/10/2020   After 5pm on weekdays and 1pm on weekends: On Call pager: [phone]

## 2020-03-11 LAB — GLUCOSE, CAPILLARY
Glucose-Capillary: 101 mg/dL — ABNORMAL HIGH (ref 70–99)
Glucose-Capillary: 121 mg/dL — ABNORMAL HIGH (ref 70–99)
Glucose-Capillary: 134 mg/dL — ABNORMAL HIGH (ref 70–99)
Glucose-Capillary: 88 mg/dL (ref 70–99)

## 2020-03-11 MED ORDER — INSULIN ASPART 100 UNIT/ML ~~LOC~~ SOLN
5.0000 [IU] | Freq: Three times a day (TID) | SUBCUTANEOUS | Status: DC
  Administered 2020-03-12 – 2020-03-15 (×7): 5 [IU] via SUBCUTANEOUS

## 2020-03-11 MED ORDER — INSULIN DETEMIR 100 UNIT/ML ~~LOC~~ SOLN
30.0000 [IU] | Freq: Two times a day (BID) | SUBCUTANEOUS | Status: DC
  Filled 2020-03-11 (×4): qty 0.3

## 2020-03-11 NOTE — Plan of Care (Signed)

## 2020-03-11 NOTE — Progress Notes (Signed)
  Speech Language Pathology Treatment: Cognitive-Linquistic  Patient Details Name: Bryan [name] MRN: [id number] DOB: 16-Jan-1976 Today's Date: 03/11/2020 Time: 3716-9678 SLP Time Calculation (min) (ACUTE ONLY): 14 min  Assessment / Plan / Recommendation Clinical Impression  Pt seen for cognitive treatment.  He was oriented to elements of self/biography, but had difficulty with describing situation or elements of time.  After review of dates, including date of admission, and basic condition, he was able to repeat three facts immediately and after two minute delay, then unable to recall but able to recognize correct response when given a choice of three options.  He was able to identify two basic areas of focus in therapy, requiring cues to explain how deficits might impact basic function.  Recommend continued cognitive f/u here and hopefully in CIR.   HPI HPI: Pt is a 45 y.o. man with history of schizophrenia who presented to [facility] ED on 01/25/20 with acute encephalopathy and lethargy and was admitted for DKA. He was transferred to the ICU on 11/30 with agitation and hypotension, had subsequent PEA arrest. Patient was intubated 11/30 and trached on 12/18. Found to have acute pancreatitis with unclear etiology though found to have elevated triglycerides. Encephalopathy felt to be secondary to anoxia. Pt is s/p trach on 12/18 due to ventilator dependence. Subsequently spiked another fever on 12/19 with CXR findings suspicious for multifocal pneumonia. Bryan [name] was downsized from #8 cuffed to #6 cuffless on 12/28.      SLP Plan  Continue with current plan of care       Recommendations                   Oral Care Recommendations: Oral care BID Follow up Recommendations: Inpatient Rehab SLP Visit Diagnosis: Cognitive communication deficit (L38.101) Plan: Continue with current plan of care       GO                Carolan Shiver 03/11/2020, 3:51 PM  Alfonso Shackett L. Samson Frederic, MA  CCC/SLP Acute Rehabilitation Services Office number [phone] Pager [phone]

## 2020-03-11 NOTE — Progress Notes (Addendum)
Subjective:   O/N: No acute events overnight.   Patient seen and evaluated at bedside this AM. He states that he is doing well and slept through the night. He states that his appetite is slowly returning. He was able to eat most of his potatoes, and was able to drink his shake this morning.  Objective:  Vital signs in last 24 hours: Vitals:   03/10/20 0517 03/10/20 1631 03/10/20 2157 03/11/20 0345  BP: 119/89 114/78 112/88 118/82  Pulse: (!) 103 92 (!) 102 97  Resp: 17 16 20 20   Temp: 98.5 F (36.9 C) 98.7 F (37.1 C) 98.3 F (36.8 C) 98.3 F (36.8 C)  TempSrc: Oral Oral  Oral  SpO2: 95% 96% 92% 96%  Weight:      Height:       Physical Exam Nursing note reviewed.  Constitutional:      General: He is not in acute distress.    Appearance: Normal appearance. He is not ill-appearing or toxic-appearing.     Comments: NAD  HENT:     Head: Normocephalic and atraumatic.  Eyes:     General:        Right eye: No discharge.  Neck:     Comments: Well healing stoma Cardiovascular:     Rate and Rhythm: Regular rhythm. Tachycardia present.     Pulses: Normal pulses.     Heart sounds: Normal heart sounds. No murmur heard. No friction rub. No gallop.   Pulmonary:     Effort: Pulmonary effort is normal.     Breath sounds: Normal breath sounds. No wheezing, rhonchi or rales.  Abdominal:     General: Abdomen is flat. Bowel sounds are normal.     Palpations: Abdomen is soft.     Tenderness: There is no abdominal tenderness. There is no guarding.  Musculoskeletal:        General: No swelling.     Right lower leg: No edema.     Left lower leg: No edema.  Neurological:     General: No focal deficit present.     Mental Status: He is alert and oriented to person, place, and time.  Psychiatric:        Mood and Affect: Mood normal.        Behavior: Behavior normal.     Assessment/Plan: Bryan [name] is a 45 year old male with a pertinent past medical history of schizophrenia  and diabetes who presented to 56 on 01/25/2020 with acute metabolic encephalopathy secondary to DKA.  His clinical course has been complicated by acute pancreatitis resulting in significant hypotension requiring emergent intubation due to PEA arrest. Subsequent MSSA pneumonia which is all resolved and is now recovering, saturating well on room air since 03/01/20, now post self-decannulation and off of tube feeds.  This is hospital day 33.  Active Problems:   Schizoaffective disorder (HCC)   MDD (major depressive disorder), recurrent, severe, with psychosis (HCC)   DKA (diabetic ketoacidosis) (HCC)   Pancreatitis   Respiratory failure (HCC)   AKI (acute kidney injury) (HCC)   Dehydration   Toxic metabolic encephalopathy   Pressure injury of skin   Tracheostomy secondary to acute hypoxic respiratory failure S/p decannulation 03/03/20 Patient continues to breathe comfortably on room air, maintaining oxygen saturations >95%. Well healing stoma with no drainage or exudate.  Acute metabolic encephalopathy S/p PEA cardiac arrest with anoxic brain injury Schizophrenia Cortrak removed 03/03/19. Patient continues to endorse lack of appetite, there is concern that this may be  due to his schizophrenia, but will continue to monitor.   - Dysphagia 3 diet. - Appreciate SLP recommendations.   - Seroquel 100 mg BID  New diagnosis of diabetes presenting with DKA No hypoglycemia overnight. - Continue metformin glucophage XR 500 mg daily - Decrease Levemir to 30u BID - Novolog 8u TIDWC  Sinus tachycardia:  - Continue metoprolol 50 mg BID    Decubitus ulcer on coccyx, unstageable - Continue wound care - Continue offloading pressure - Continue using pressure injury pads  Diet: Dysphagia 3 IVF: none VTE: Lovenox CODE: Full code  Prior to Admission Living Arrangement: Home PT/OT recs: CIR Anticipated Discharge Location: CIR. Patient received Pfizer vaccine #1 on 03/06/20.  Signed: Dolan Amen, MD  Internal Medicine Resident, PGY-2 [facility]  Pager: [phone] 8:20 AM, 03/11/2020   After 5pm on weekdays and 1pm on weekends: On Call pager: [phone]

## 2020-03-11 NOTE — Progress Notes (Signed)
Physical Therapy Treatment Patient Details Name: Bryan [name] MRN: [id number] DOB: [birth year] Today's Date: 03/11/2020    History of Present Illness Pt is a 45 y.o. man with history of schizophrenia who presented to [facility] ED on 01/25/20 with acute encephalopathy and lethargy and was admitted for DKA. He was transferred to the ICU on 11/30 with agitation and hypotension, had subsequent PEA arrest. Found to have acute pancreatitis with unclear etiology though found to have elevated triglycerides. Encephalopathy felt to be secondary to anoxia. Pt is s/p trach on 12/18 due to ventilator dependence. Subsequently spiked another fever on 12/19 with CXR findings suspicious for multifocal pneumonia    PT Comments    Pt making excellent progress today.  He performed multiple stands from varying surfaces with min A of 2 for safety - into STEDY and RW.  He was able to take a few steps with mod A x 2 - was very ataxic.  Spent increased time positioning with pillows for comfort/pressure relief.  Continue to progress as able. Continue to recommend CIR.     Follow Up Recommendations  CIR     Equipment Recommendations  3in1 (PT);Wheelchair (measurements PT);Wheelchair cushion (measurements PT);Hospital bed;Rolling walker with 5" wheels    Recommendations for Other Services Rehab consult     Precautions / Restrictions Precautions Precautions: Fall Required Braces or Orthoses: Other Brace Other Brace: L wrist cock up splint to be worn during day per RN    Mobility  Bed Mobility Overal bed mobility: Needs Assistance Bed Mobility: Rolling;Sidelying to Sit Rolling: Min assist Sidelying to sit: Mod assist       General bed mobility comments: Cues to reach across and roll then push with arms, min A to get legs off bed and mod A to lift trunk  Transfers Overall transfer level: Needs assistance Equipment used: Rolling walker (2 wheeled) Transfers: Sit to/from [employer] Sit to  Stand: Min assist;+2 safety/equipment Stand pivot transfers: Total assist (used STEDY to pivot)       General transfer comment: Sit to stand x 1 from elevated bed, x 1 from low bed, x 3 from STEDY, and x 2 from recliner.  All with min A of 2 for safety and therapist assisted with hand placement on RW or STEDY.  Ambulation/Gait Ambulation/Gait assistance: Mod assist;+2 physical assistance Gait Distance (Feet): 4 Feet (4'x2) Assistive device: Rolling walker (2 wheeled) Gait Pattern/deviations: Step-to pattern;Decreased stride length;Ataxic Gait velocity: decreased   General Gait Details: Took 4 steps forward and backward twice.  Requiring mod A x 2 to weight shift, control walker, balance, and for cues.  Also did weight shifting and lifting feet in STEDY prior to attempting steps.   Stairs             Wheelchair Mobility    Modified Rankin (Stroke Patients Only)       Balance Overall balance assessment: Needs assistance Sitting-balance support: No upper extremity supported Sitting balance-Leahy Scale: Fair Sitting balance - Comments: Pt able to sit at EOB with close supervision but did require min A at times due to posterior lean   Standing balance support: Bilateral upper extremity supported Standing balance-Leahy Scale: Poor Standing balance comment: Requring UE support.  Stood 3 times in [place] with UE support but no assist from therapist to maintain.  Stood in [place] for ~1 min each rep. For ambulation requiring mod A of 2  Cognition Arousal/Alertness: Awake/alert Behavior During Therapy: Flat affect Overall Cognitive Status: Impaired/Different from baseline Area of Impairment: Attention;Memory;Following commands;Safety/judgement;Awareness;Problem solving;Orientation                 Orientation Level: Disoriented to;Time;Situation Current Attention Level: Sustained Memory: Decreased recall of precautions;Decreased  short-term memory Following Commands: Follows one step commands consistently;Follows multi-step commands inconsistently Safety/Judgement: Decreased awareness of safety;Decreased awareness of deficits Awareness: Intellectual Problem Solving: Slow processing;Decreased initiation;Difficulty sequencing;Requires tactile cues;Requires verbal cues General Comments: Pt with flat affect but participated well and engaged with therapy      Exercises General Exercises - Lower Extremity Ankle Circles/Pumps: AROM;Both;10 reps;Supine Long Arc Quad: AROM;Both;10 reps;Seated Heel Slides: AAROM;Both;10 reps;Supine Hip ABduction/ADduction: AAROM;Supine;Both;10 reps    General Comments        Pertinent Vitals/Pain Pain Assessment: Faces Faces Pain Scale: Hurts little more Pain Location: R buttock Pain Descriptors / Indicators: Burning Pain Intervention(s): Limited activity within patient's tolerance;Monitored during session;Repositioned;Other (comment) (R buttock when sitting - repositioned to offload)    Home Living                      Prior Function            PT Goals (current goals can now be found in the care plan section) Acute Rehab PT Goals Patient Stated Goal: not stated PT Goal Formulation: With patient Time For Goal Achievement: 03/21/20 Potential to Achieve Goals: Good Progress towards PT goals: Progressing toward goals    Frequency    Min 4X/week      PT Plan Current plan remains appropriate    Co-evaluation              AM-PAC PT "6 Clicks" Mobility   Outcome Measure  Help needed turning from your back to your side while in a flat bed without using bedrails?: A Little Help needed moving from lying on your back to sitting on the side of a flat bed without using bedrails?: A Lot Help needed moving to and from a bed to a chair (including a wheelchair)?: A Lot Help needed standing up from a chair using your arms (e.g., wheelchair or bedside chair)?: A  Little Help needed to walk in hospital room?: A Lot Help needed climbing 3-5 steps with a railing? : Total 6 Click Score: 13    End of Session Equipment Utilized During Treatment: Gait belt Activity Tolerance: Patient tolerated treatment well Patient left: with call bell/phone within reach;with chair alarm set;in chair;Other (comment) (Pt positioned with multiple pillows to support (behind knees and R back). He c/o R buttock burning after a few mins - re positioned with pillow under R hip) Nurse Communication: Mobility status (notifed tech verbally and RN secure chat - min A x 2 with STEDY) PT Visit Diagnosis: Unsteadiness on feet (R26.81);Difficulty in walking, not elsewhere classified (R26.2);Muscle weakness (generalized) (M62.81)     Time: 9147-8295 PT Time Calculation (min) (ACUTE ONLY): 33 min  Charges:  $Gait Training: 8-22 mins $Therapeutic Activity: 8-22 mins                     Anise Salvo, PT Acute Rehab Services Pager [phone] [facility] [phone]     Rayetta Humphrey 03/11/2020, 3:49 PM

## 2020-03-11 NOTE — Progress Notes (Signed)
Occupational Therapy Treatment Patient Details Name: Bryan [name] MRN: [id number] DOB: 1975/07/27 Today's Date: 03/11/2020    History of present illness Pt is a 45 y.o. man with history of schizophrenia who presented to [facility] ED on 01/25/20 with acute encephalopathy and lethargy and was admitted for DKA. He was transferred to the ICU on 11/30 with agitation and hypotension, had subsequent PEA arrest. Found to have acute pancreatitis with unclear etiology though found to have elevated triglycerides. Encephalopathy felt to be secondary to anoxia. Pt is s/p trach on 12/18 due to ventilator dependence. Subsequently spiked another fever on 12/19 with CXR findings suspicious for multifocal pneumonia   OT comments  Patient found sliding forward in recliner calling out for assist.  NT in room, OT assisted patient get to an upright position in the recliner.  He needed min A to lean forward, then he was able to push through his UE's and slide his bottom back with VC's and tactile cues.  Patient assisted back to bed: min A for sit to stand and time to place hands on the RW.  Tactile and VC's to weight shift in order to take a step forward, pivot and take 3 steps back with up to Mod A.  Patient was very unsteady, and unsure if he has ataxic/apraxic incoordination, or extrapyramidal symptoms.  Advised NT to use steady for transfers at this time.  Patient offered wash cloth for grooming while sitting up, and a fresh gown was provided.  Incoordination limits independence with grooming and UB dressing.  OT left the patient with NT, who was assisting the patient.     Follow Up Recommendations  CIR    Equipment Recommendations  3 in 1 bedside commode;Hospital bed;Wheelchair cushion (measurements OT);Wheelchair (measurements OT)    Recommendations for Other Services Rehab consult    Precautions / Restrictions Precautions Precautions: Fall Precaution Comments: condom cath Required Braces or Orthoses: Other  Brace Other Brace: L wrist cock up splint to be worn during day per RN Restrictions Weight Bearing Restrictions: No       Mobility Bed Mobility Overal bed mobility: Needs Assistance Bed Mobility: Rolling;Sidelying to Sit Rolling: Min assist Sidelying to sit: Mod assist   Sit to supine: Mod assist   General bed mobility comments: Cues to reach across and roll then push with arms, min A to get legs off bed and mod A to lift trunk  Transfers Overall transfer level: Needs assistance Equipment used: Rolling walker (2 wheeled) Transfers: Sit to/from [employer] Sit to Stand: Min assist Stand pivot transfers: Mod assist       General transfer comment: patient given support to encourage weight shifting to move feet for pivot.    Balance Overall balance assessment: Needs assistance Sitting-balance support: No upper extremity supported Sitting balance-Leahy Scale: Fair Sitting balance - Comments: Pt able to sit at EOB with close supervision but did require min A at times due to posterior lean   Standing balance support: Bilateral upper extremity supported Standing balance-Leahy Scale: Poor Standing balance comment: reiles on RW                           ADL either performed or assessed with clinical judgement   ADL       Grooming: Wash/dry face;Wash/dry hands;Sitting;Min guard Grooming Details (indicate cue type and reason): ataxia, tremors noted.         Upper Body Dressing : Moderate assistance;Sitting  Functional mobility during ADLs: Moderate assistance;Rolling walker                         Cognition Arousal/Alertness: Awake/alert Behavior During Therapy: Flat affect Overall Cognitive Status: Impaired/Different from baseline Area of Impairment: Attention;Memory;Following commands;Safety/judgement;Awareness;Problem solving;Orientation                 Orientation Level: Disoriented  to;Time;Situation Current Attention Level: Sustained Memory: Decreased recall of precautions;Decreased short-term memory Following Commands: Follows one step commands consistently;Follows multi-step commands inconsistently Safety/Judgement: Decreased awareness of safety;Decreased awareness of deficits Awareness: Intellectual Problem Solving: Slow processing;Decreased initiation;Difficulty sequencing;Requires verbal cues;Requires tactile cues General Comments: Pt with flat affect but participated well and engaged with therapy        Exercises General Exercises - Lower Extremity Ankle Circles/Pumps: AROM;Both;10 reps;Supine Long Arc Quad: AROM;Both;10 reps;Seated Heel Slides: AAROM;Both;10 reps;Supine Hip ABduction/ADduction: AAROM;Supine;Both;10 reps   Shoulder Instructions       General Comments      Pertinent Vitals/ Pain       Pain Assessment: No/denies pain Faces Pain Scale: Hurts little more Pain Location: R buttock Pain Descriptors / Indicators: Burning Pain Intervention(s): Monitored during session                                                          Frequency  Min 2X/week        Progress Toward Goals  OT Goals(current goals can now be found in the care plan section)  Progress towards OT goals: Progressing toward goals  Acute Rehab OT Goals Patient Stated Goal: make my legs move better OT Goal Formulation: With patient Time For Goal Achievement: 03/21/20 Potential to Achieve Goals: Fair  Plan Discharge plan needs to be updated    Co-evaluation                 AM-PAC OT "6 Clicks" Daily Activity     Outcome Measure   Help from another person eating meals?: A Lot Help from another person taking care of personal grooming?: A Little Help from another person toileting, which includes using toliet, bedpan, or urinal?: A Lot Help from another person bathing (including washing, rinsing, drying)?: A Lot Help from another  person to put on and taking off regular upper body clothing?: A Lot Help from another person to put on and taking off regular lower body clothing?: A Lot 6 Click Score: 13    End of Session Equipment Utilized During Treatment: Gait belt;Rolling walker  OT Visit Diagnosis: Cognitive communication deficit (R41.841);Muscle weakness (generalized) (M62.81);Unsteadiness on feet (R26.81)   Activity Tolerance Patient tolerated treatment well   Patient Left in bed;with call bell/phone within reach;with nursing/sitter in room   Nurse Communication          Time: 2831-5176 OT Time Calculation (min): 13 min  Charges: OT General Charges $OT Visit: 1 Visit OT Treatments $Self Care/Home Management : 8-22 mins  03/11/2020  Rich, OTR/L  Acute Rehabilitation Services  Office:  [phone]    Suzanna Obey 03/11/2020, 4:21 PM

## 2020-03-11 NOTE — Progress Notes (Signed)
Inpatient Rehabilitation-Admissions Coordinator   I do not have a bed available for this patient today or this weekend. Will follow up Monday if pt remains in house and has continued CIR needs.   Cheri Rous, OTR/L  Rehab Admissions Coordinator  [phone] 03/11/2020 11:51 AM

## 2020-03-11 NOTE — Plan of Care (Signed)
  Problem: Clinical Measurements: Goal: Will remain free from infection Outcome: Progressing Goal: Diagnostic test results will improve Outcome: Progressing Goal: Cardiovascular complication will be avoided Outcome: Progressing   

## 2020-03-12 LAB — GLUCOSE, CAPILLARY
Glucose-Capillary: 112 mg/dL — ABNORMAL HIGH (ref 70–99)
Glucose-Capillary: 126 mg/dL — ABNORMAL HIGH (ref 70–99)
Glucose-Capillary: 127 mg/dL — ABNORMAL HIGH (ref 70–99)
Glucose-Capillary: 123 mg/dL — ABNORMAL HIGH (ref 70–99)

## 2020-03-12 MED ORDER — INSULIN DETEMIR 100 UNIT/ML ~~LOC~~ SOLN
30.0000 [IU] | Freq: Every day | SUBCUTANEOUS | Status: DC
  Administered 2020-03-12: 30 [IU] via SUBCUTANEOUS
  Filled 2020-03-12 (×2): qty 0.3

## 2020-03-12 MED ORDER — INSULIN DETEMIR 100 UNIT/ML ~~LOC~~ SOLN
30.0000 [IU] | Freq: Every day | SUBCUTANEOUS | Status: DC
  Administered 2020-03-13 – 2020-03-15 (×3): 30 [IU] via SUBCUTANEOUS
  Filled 2020-03-12 (×4): qty 0.3

## 2020-03-12 NOTE — Progress Notes (Signed)
Subjective:   O/N: No acute events overnight.   Patient states he slept well, and received about 7 hours of sleep last night. His appetite is increasing, and he was able to finish 75% of his meals and drinks.   Objective:  Vital signs in last 24 hours: Vitals:   03/11/20 0345 03/11/20 1430 03/11/20 2124 03/12/20 0600  BP: 118/82 124/87 110/77 106/81  Pulse: 97 94 99 88  Resp: 20 17 18 20   Temp: 98.3 F (36.8 C) 98.4 F (36.9 C) 99.1 F (37.3 C) 98.9 F (37.2 C)  TempSrc: Oral Oral Oral Oral  SpO2: 96% 97% 93% 96%  Weight:      Height:       Physical Exam Constitutional:      General: He is not in acute distress.    Appearance: Normal appearance.  HENT:     Head: Normocephalic and atraumatic.  Cardiovascular:     Rate and Rhythm: Normal rate and regular rhythm.     Pulses: Normal pulses.     Heart sounds: Normal heart sounds. No murmur heard. No friction rub. No gallop.   Abdominal:     General: Abdomen is flat. Bowel sounds are normal.     Palpations: Abdomen is soft.  Musculoskeletal:     Right lower leg: No edema.     Left lower leg: No edema.  Skin:    General: Skin is warm.  Neurological:     Mental Status: He is alert and oriented to person, place, and time.  Psychiatric:        Mood and Affect: Mood normal.        Behavior: Behavior normal.     Assessment/Plan: Bryan [name] is a 45 year old male with a pertinent past medical history of schizophrenia and diabetes who presented to [facility] on 01/25/2020 with acute metabolic encephalopathy secondary to DKA.  His clinical course has been complicated by acute pancreatitis resulting in significant hypotension requiring emergent intubation due to PEA arrest. Subsequent MSSA pneumonia which is all resolved and is now recovering, saturating well on room air since 03/01/20, now post self-decannulation and off of tube feeds.  This is hospital day 24.  Active Problems:   Schizoaffective disorder (HCC)   MDD  (major depressive disorder), recurrent, severe, with psychosis (HCC)   DKA (diabetic ketoacidosis) (HCC)   Pancreatitis   Respiratory failure (HCC)   AKI (acute kidney injury) (HCC)   Dehydration   Toxic metabolic encephalopathy   Pressure injury of skin   Tracheostomy secondary to acute hypoxic respiratory failure S/p decannulation 03/03/20 Patient continues to breathe comfortably on room air, maintaining oxygen saturations >95%. Well healing stoma with no drainage or exudate.  Acute metabolic encephalopathy S/p PEA cardiac arrest with anoxic brain injury Schizophrenia Cortrak removed 03/03/19. His appetite in increasing and is finishing 75% of his meals.  - Dysphagia 3 diet. - Appreciate SLP recommendations.   - Seroquel 100 mg BID  New diagnosis of diabetes presenting with DKA No hypoglycemia overnight. - Continue metformin glucophage XR 500 mg daily - Decrease Levemir to 30U Daily - Novolog 5U TIDWC  Sinus tachycardia:  - Continue metoprolol 50 mg BID    Decubitus ulcer on coccyx, unstageable - Continue wound care - Continue offloading pressure - Continue using pressure injury pads  Diet: Dysphagia 3 IVF: none VTE: Lovenox CODE: Full code  Prior to Admission Living Arrangement: Home PT/OT recs: CIR Anticipated Discharge Location: CIR. Patient received Pfizer vaccine #1 on 03/06/20.  Signed:  Dolan Amen, MD  Internal Medicine Resident, PGY-2 [facility]  Pager: [phone] 12:08 PM, 03/12/2020   After 5pm on weekdays and 1pm on weekends: On Call pager: [phone]

## 2020-03-13 LAB — GLUCOSE, CAPILLARY
Glucose-Capillary: 114 mg/dL — ABNORMAL HIGH (ref 70–99)
Glucose-Capillary: 109 mg/dL — ABNORMAL HIGH (ref 70–99)
Glucose-Capillary: 162 mg/dL — ABNORMAL HIGH (ref 70–99)
Glucose-Capillary: 107 mg/dL — ABNORMAL HIGH (ref 70–99)

## 2020-03-13 NOTE — Progress Notes (Signed)
   Subjective:   No acute events overnight.  During rounds, patient states he is feeling more energized today. He denies pain or discomfort. Is eager to see the snow falling outside.   Objective:  Vital signs in last 24 hours: Vitals:   03/12/20 0600 03/12/20 1354 03/12/20 2029 03/13/20 0527  BP: 106/81 109/75 (!) 111/93 110/80  Pulse: 88 92 97 92  Resp: 20 18 18 18   Temp: 98.9 F (37.2 C) 99.1 F (37.3 C) 98.4 F (36.9 C) 98.5 F (36.9 C)  TempSrc: Oral Oral Oral Oral  SpO2: 96% 95% 97% 96%  Weight:      Height:       Physical Exam: Constitutional: WDWN man lying in bed, in no acute distress HENT: stoma clean/dry/intact  Cardiovascular: HR in the 90s, no m/r/g Pulmonary/Chest: normal work of breathing on room air Neurological: voice clear and strong, speech is clear  Assessment/Plan: Bryan [name] is a 45 year old male with a pertinent past medical history of schizophrenia and diabetes who presented to 56 on 01/25/2020 with acute metabolic encephalopathy secondary to DKA.  His clinical course has been complicated by acute pancreatitis resulting in significant hypotension requiring emergent intubation due to PEA arrest. Subsequent MSSA pneumonia which is all resolved and is now recovering, saturating well on room air since 03/01/20, now post self-decannulation and off of tube feeds.  This is hospital day 48.  Active Problems:   Schizoaffective disorder (HCC)   MDD (major depressive disorder), recurrent, severe, with psychosis (HCC)   DKA (diabetic ketoacidosis) (HCC)   Pancreatitis   Respiratory failure (HCC)   AKI (acute kidney injury) (HCC)   Dehydration   Toxic metabolic encephalopathy   Pressure injury of skin   Tracheostomy secondary to acute hypoxic respiratory failure S/p decannulation 03/03/20 Patient continues to breathe comfortably on room air, maintaining oxygen saturations >95%. Well healing stoma with no drainage or exudate.  Acute metabolic  encephalopathy S/p PEA cardiac arrest with anoxic brain injury Schizophrenia Cortrak removed 03/03/19. His appetite in increasing and is finishing 75-90% of his meals.  - Dysphagia 3 diet - Appreciate SLP recommendations - Seroquel 100 mg BID  New diagnosis of diabetes presenting with DKA No hypoglycemia overnight.  - Continue metformin glucophage XR 500 mg daily - Levemir 30U Daily - Novolog 5U TIDWC  Sinus tachycardia - Continue metoprolol 50 mg BID    Decubitus ulcer on coccyx, unstageable - Continue wound care - Continue offloading pressure - Continue using pressure injury pads  Diet: Dysphagia 3 IVF: none VTE: Lovenox CODE: Full code  Prior to Admission Living Arrangement: Home PT/OT recs: CIR Anticipated Discharge Location: CIR. Patient received Pfizer vaccine #1 on 03/06/20.   05/04/20, MD PGY-1 Internal Medicine Teaching Service Pager: [phone] 6:35 AM, 03/13/2020    After 5pm on weekdays and 1pm on weekends: On Call pager: [phone]

## 2020-03-14 LAB — GLUCOSE, CAPILLARY
Glucose-Capillary: 114 mg/dL — ABNORMAL HIGH (ref 70–99)
Glucose-Capillary: 109 mg/dL — ABNORMAL HIGH (ref 70–99)
Glucose-Capillary: 120 mg/dL — ABNORMAL HIGH (ref 70–99)
Glucose-Capillary: 121 mg/dL — ABNORMAL HIGH (ref 70–99)

## 2020-03-14 MED ORDER — METFORMIN HCL ER 500 MG PO TB24
500.0000 mg | ORAL_TABLET | Freq: Two times a day (BID) | ORAL | Status: DC
  Administered 2020-03-14 – 2020-03-15 (×2): 500 mg via ORAL
  Filled 2020-03-14 (×5): qty 1

## 2020-03-14 MED ORDER — ADULT MULTIVITAMIN W/MINERALS CH
1.0000 | ORAL_TABLET | Freq: Every day | ORAL | Status: DC
  Administered 2020-03-14 – 2020-03-15 (×2): 1 via ORAL
  Filled 2020-03-14 (×2): qty 1

## 2020-03-14 NOTE — Progress Notes (Addendum)
   Subjective:   No acute events overnight.  During rounds, patient states he is doing well. Resting comfortably, no concerns at this time. Informed him that the plan is still to get him to do a discharge facility.   Objective:  Vital signs in last 24 hours: Vitals:   03/13/20 1509 03/13/20 2024 03/14/20 0500 03/14/20 0558  BP: 105/74 111/80  108/87  Pulse: 88 81  96  Resp: 14 18    Temp: 99.3 F (37.4 C) 99 F (37.2 C)  99.3 F (37.4 C)  TempSrc: Oral Oral  Oral  SpO2: 96% 95%  97%  Weight:   94 kg   Height:       Physical Exam: Constitutional: WDWN man lying in bed, in no acute distress  HENT: stoma clean/dry/intact  Cardiovascular: HR in the 90s, no m/r/g Pulmonary/Chest: normal work of breathing on room air Neurological: voice clear and strong, speech is clear  Assessment/Plan: Bryan [name] is a 45 year old male with a pertinent past medical history of schizophrenia and diabetes who presented to [facility] on 01/25/2020 with acute metabolic encephalopathy secondary to DKA.  His clinical course has been complicated by acute pancreatitis resulting in significant hypotension requiring emergent intubation due to PEA arrest. Subsequent MSSA pneumonia which is all resolved and is now recovering, saturating well on room air since 03/01/20, now post self-decannulation and off of tube feeds.  This is hospital day 53.  Active Problems:   Schizoaffective disorder (HCC)   MDD (major depressive disorder), recurrent, severe, with psychosis (HCC)   DKA (diabetic ketoacidosis) (HCC)   Pancreatitis   Respiratory failure (HCC)   AKI (acute kidney injury) (HCC)   Dehydration   Toxic metabolic encephalopathy   Pressure injury of skin   Tracheostomy secondary to acute hypoxic respiratory failure S/p decannulation 03/03/20 Patient continues to breathe comfortably on room air, maintaining oxygen saturations >95%. Well healing stoma with no drainage or exudate.  Acute metabolic  encephalopathy S/p PEA cardiac arrest with anoxic brain injury Schizophrenia Cortrak removed 03/03/19. His appetite in increasing and is finishing 75-90% of his meals.  - Dysphagia 3 diet - Appreciate SLP recommendations - Seroquel 100 mg BID  New diagnosis of diabetes presenting with DKA No hypoglycemia overnight.  - Increase metformin to glucophage XR 500 mg Twice daily - Levemir 30U Daily - Novolog 5U TIDWC  Sinus tachycardia - Continue metoprolol 50 mg BID    Decubitus ulcer on coccyx, unstageable - Continue wound care - Continue offloading pressure - Continue using pressure injury pads  Diet: Dysphagia 3 IVF: none VTE: Lovenox CODE: Full code  Prior to Admission Living Arrangement: Home PT/OT recs: CIR Anticipated Discharge Location: CIR. Patient received Pfizer vaccine #1 on 03/06/20.   Dolan Amen, MD PGY-1 Internal Medicine Teaching Service Pager: [phone] 7:36 AM, 03/14/2020    After 5pm on weekdays and 1pm on weekends: On Call pager: [phone]

## 2020-03-14 NOTE — Progress Notes (Addendum)
Inpatient Rehabilitation-Admissions Coordinator   I do not have a bed available for this patient in CIR today. Will follow up tomorrow for possible admit, pending bed availability.   Cheri Rous, OTR/L  Rehab Admissions Coordinator  [phone] 03/14/2020 12:04 PM

## 2020-03-14 NOTE — Progress Notes (Signed)
Nutrition Follow-up  DOCUMENTATION CODES:   Not applicable  INTERVENTION:   -MVI with minerals daily -Continue Ensure Enlive po TID, each supplement provides 350 kcal and 20 grams of protein -Continue 30 ml Prosource Plus TID, each supplement provides 100 kcals and 15 grams protein  NUTRITION DIAGNOSIS:   Increased nutrient needs related to acute illness,wound healing as evidenced by estimated needs.  Ongoing  GOAL:   Patient will meet greater than or equal to 90% of their needs  Progressing   MONITOR:   PO intake,Supplement acceptance,Skin,Weight trends,Labs,I & O's  REASON FOR ASSESSMENT:   Consult Enteral/tube feeding initiation and management (trickle tube feeds)  ASSESSMENT:   45 yo male admitted with AMS, new onset DM with DKA. PMH includes MDD, paranoid schizophrenia, HTN, smoker, drug use (cocaine, marijuana), heavy alcohol use.  12/17- c/p cortrak placement, tip of tube in stomach 12/18- s/p trach 12/20- transitioned to trach collar 1/5- cortrak and TF d/c 1/6- decannulated  Reviewed I/O's: -300 ml x 24 hours and +1.4 L since 02/29/20  UOP: 300 ml x 24 hours  Pt unavailable at time of attempted contact.   Pt remains with good appetite. Noted meal completions 75-100%. Pt is consuming Ensure Enlive and Prosource supplements per MAR.   Per CIR admissions coordinator, awaiting bed availability for transfer.   Labs reviewed: CBGS: 107-162 (inpatient orders for glycemic control are 0-15 units insulin aspart TID with meals, 5 units insulin aspart TID with meals, and 30 units inuslin detemir daily).   Diet Order:   Diet Order            Diet regular Room service appropriate? Yes; Fluid consistency: Thin  Diet effective now                 EDUCATION NEEDS:   Not appropriate for education at this time  Skin:  Skin Assessment: Reviewed RN Assessment Skin Integrity Issues:: DTI,Incisions DTI: lt heel Stage II: coccyx (12/17) Incisions: closed lt  neck s/p trach Other: MASD and skin tear to buttocks  Last BM:  03/12/20  Height:   Ht Readings from Last 1 Encounters:  01/25/20 5\' 10"  (1.778 m)    Weight:   Wt Readings from Last 1 Encounters:  03/14/20 94 kg    Ideal Body Weight:  75.5 kg  BMI:  Body mass index is 29.73 kg/m.  Estimated Nutritional Needs:   Kcal:  2200-2400  Protein:  130-150 gm  Fluid:  2.5 L    03/16/20, RD, LDN, CDCES Registered Dietitian II Certified Diabetes Care and Education Specialist Please refer to [facility] for RD and/or RD on-call/weekend/after hours pager

## 2020-03-14 NOTE — Plan of Care (Signed)
  Problem: Education: Goal: Knowledge of General Education information will improve Description: Including pain rating scale, medication(s)/side effects and non-pharmacologic comfort measures Outcome: Progressing   Problem: Health Behavior/Discharge Planning: Goal: Ability to manage health-related needs will improve Outcome: Progressing   Problem: Clinical Measurements: Goal: Ability to maintain clinical measurements within normal limits will improve Outcome: Progressing   Problem: Nutrition: Goal: Adequate nutrition will be maintained Outcome: Progressing   Problem: Elimination: Goal: Will not experience complications related to urinary retention Outcome: Progressing   Problem: Pain Managment: Goal: General experience of comfort will improve Outcome: Progressing   Problem: Safety: Goal: Ability to remain free from injury will improve Outcome: Progressing   Problem: Skin Integrity: Goal: Risk for impaired skin integrity will decrease Outcome: Progressing   Problem: Health Behavior/Discharge Planning: Goal: Ability to manage health-related needs will improve Outcome: Progressing

## 2020-03-14 NOTE — Progress Notes (Signed)
Physical Therapy Treatment Patient Details Name: Bryan [name] MRN: [id number] DOB: 12/26/1975 Today's Date: 03/14/2020    History of Present Illness Pt is a 45 y.o. man with history of schizophrenia who presented to [facility] ED on 01/25/20 with acute encephalopathy and lethargy and was admitted for DKA. He was transferred to the ICU on 11/30 with agitation and hypotension, had subsequent PEA arrest. Found to have acute pancreatitis with unclear etiology though found to have elevated triglycerides. Encephalopathy felt to be secondary to anoxia. Pt is s/p trach on 12/18 due to ventilator dependence. Subsequently spiked another fever on 12/19 with CXR findings suspicious for multifocal pneumonia    PT Comments    Pt progressing well with mobility, tolerating repeated stands with use of RW and pre-gait tasks. Pt overall requiring mod PT assist to perform, and is very limited by ataxia once in standing. Pt remains an excellent CIR candidate, will continue to follow.   Follow Up Recommendations  CIR     Equipment Recommendations  3in1 (PT);Wheelchair (measurements PT);Wheelchair cushion (measurements PT);Hospital bed;Rolling walker with 5" wheels    Recommendations for Other Services Rehab consult     Precautions / Restrictions Precautions Precautions: Fall Other Brace: L wrist cock up splint to be worn during day per RN (not donned during session) Restrictions Weight Bearing Restrictions: No    Mobility  Bed Mobility Overal bed mobility: Needs Assistance Bed Mobility: Supine to Sit;Sit to Supine Rolling: Min guard   Supine to sit: Min assist Sit to supine: Min assist   General bed mobility comments: min assist for supine<>sit for trunk and LE management, assist for boost up in bed upon return to supine.  Transfers Overall transfer level: Needs assistance Equipment used: Rolling walker (2 wheeled) Transfers: Sit to/from Stand Sit to Stand: Mod assist         General transfer  comment: Mod assist for power up, steady, physically placing hands on RW. Verbal cuing for hand placement when rising/sitting, tremulous and ataxic once standing not improved with weighted input through pt shoulders. STS x3 from EOB.  Ambulation/Gait             General Gait Details: lateral stepping and forward stepping only; pregait activity requiring mod assist for weight shifting, physically translating LEs, and steadying   Stairs             Wheelchair Mobility    Modified Rankin (Stroke Patients Only)       Balance Overall balance assessment: Needs assistance Sitting-balance support: No upper extremity supported Sitting balance-Leahy Scale: Fair   Postural control: Posterior lean Standing balance support: Bilateral upper extremity supported Standing balance-Leahy Scale: Poor Standing balance comment: reiles on RW                            Cognition Arousal/Alertness: Awake/alert Behavior During Therapy: Flat affect Overall Cognitive Status: Impaired/Different from baseline Area of Impairment: Attention;Memory;Following commands;Safety/judgement;Awareness;Problem solving;Orientation                 Orientation Level: Disoriented to;Time Current Attention Level: Sustained Memory: Decreased recall of precautions;Decreased short-term memory Following Commands: Follows one step commands consistently;Follows multi-step commands inconsistently Safety/Judgement: Decreased awareness of safety;Decreased awareness of deficits Awareness: Intellectual Problem Solving: Slow processing;Decreased initiation;Difficulty sequencing;Requires tactile cues;Requires verbal cues General Comments: Flat affect, when PT asked why pt is in the hospital pt states "because I wasn't taking care of myself". Pt requires increased time to follow commands, but does  so 100% of the time today. Motivated to get stronger      Exercises      General Comments         Pertinent Vitals/Pain Pain Assessment: Faces Faces Pain Scale: Hurts a little bit Pain Location: LEs Pain Descriptors / Indicators: Other (Comment) (stiffness) Pain Intervention(s): Limited activity within patient's tolerance;Monitored during session;Repositioned    Home Living                      Prior Function            PT Goals (current goals can now be found in the care plan section) Acute Rehab PT Goals PT Goal Formulation: With patient Time For Goal Achievement: 03/21/20 Potential to Achieve Goals: Good Progress towards PT goals: Progressing toward goals    Frequency    Min 4X/week      PT Plan Current plan remains appropriate    Co-evaluation              AM-PAC PT "6 Clicks" Mobility   Outcome Measure  Help needed turning from your back to your side while in a flat bed without using bedrails?: A Little Help needed moving from lying on your back to sitting on the side of a flat bed without using bedrails?: A Little Help needed moving to and from a bed to a chair (including a wheelchair)?: A Little Help needed standing up from a chair using your arms (e.g., wheelchair or bedside chair)?: A Lot Help needed to walk in hospital room?: A Lot Help needed climbing 3-5 steps with a railing? : Total 6 Click Score: 14    End of Session Equipment Utilized During Treatment: Gait belt Activity Tolerance: Patient tolerated treatment well Patient left: with call bell/phone within reach;in bed;with bed alarm set Nurse Communication: Mobility status (needs new external catheter) PT Visit Diagnosis: Unsteadiness on feet (R26.81);Difficulty in walking, not elsewhere classified (R26.2);Muscle weakness (generalized) (M62.81)     Time: 3354-5625 PT Time Calculation (min) (ACUTE ONLY): 21 min  Charges:  $Therapeutic Activity: 8-22 mins                    Marye Round, PT Acute Rehabilitation Services Pager [phone]  Office [phone]    Truddie Coco 03/14/2020, 6:43 PM

## 2020-03-15 ENCOUNTER — Encounter (HOSPITAL_COMMUNITY): Payer: Self-pay | Admitting: Physical Medicine & Rehabilitation

## 2020-03-15 ENCOUNTER — Inpatient Hospital Stay (HOSPITAL_COMMUNITY)
Admission: RE | Admit: 2020-03-15 | Discharge: 2020-04-08 | DRG: 945 | Disposition: A | Discharge status: Home Health | Payer: Medicaid Other | Source: Intra-hospital | Attending: Physical Medicine & Rehabilitation | Admitting: Physical Medicine & Rehabilitation

## 2020-03-15 ENCOUNTER — Encounter (HOSPITAL_COMMUNITY): Payer: Self-pay | Admitting: Internal Medicine

## 2020-03-15 ENCOUNTER — Other Ambulatory Visit: Payer: Self-pay

## 2020-03-15 DIAGNOSIS — F203 Undifferentiated schizophrenia: Secondary | ICD-10-CM

## 2020-03-15 DIAGNOSIS — R5381 Other malaise: Secondary | ICD-10-CM | POA: Diagnosis present

## 2020-03-15 DIAGNOSIS — I10 Essential (primary) hypertension: Secondary | ICD-10-CM | POA: Diagnosis present

## 2020-03-15 DIAGNOSIS — Z79899 Other long term (current) drug therapy: Secondary | ICD-10-CM | POA: Diagnosis not present

## 2020-03-15 DIAGNOSIS — F209 Schizophrenia, unspecified: Secondary | ICD-10-CM | POA: Diagnosis present

## 2020-03-15 DIAGNOSIS — E11319 Type 2 diabetes mellitus with unspecified diabetic retinopathy without macular edema: Secondary | ICD-10-CM | POA: Diagnosis present

## 2020-03-15 DIAGNOSIS — L89629 Pressure ulcer of left heel, unspecified stage: Secondary | ICD-10-CM | POA: Diagnosis present

## 2020-03-15 DIAGNOSIS — R7989 Other specified abnormal findings of blood chemistry: Secondary | ICD-10-CM

## 2020-03-15 DIAGNOSIS — F17210 Nicotine dependence, cigarettes, uncomplicated: Secondary | ICD-10-CM | POA: Diagnosis present

## 2020-03-15 DIAGNOSIS — E782 Mixed hyperlipidemia: Secondary | ICD-10-CM | POA: Diagnosis not present

## 2020-03-15 DIAGNOSIS — Z794 Long term (current) use of insulin: Secondary | ICD-10-CM | POA: Diagnosis not present

## 2020-03-15 DIAGNOSIS — Z9119 Patient's noncompliance with other medical treatment and regimen: Secondary | ICD-10-CM

## 2020-03-15 DIAGNOSIS — Z23 Encounter for immunization: Secondary | ICD-10-CM | POA: Diagnosis not present

## 2020-03-15 DIAGNOSIS — G931 Anoxic brain damage, not elsewhere classified: Secondary | ICD-10-CM | POA: Diagnosis present

## 2020-03-15 DIAGNOSIS — D62 Acute posthemorrhagic anemia: Secondary | ICD-10-CM | POA: Diagnosis present

## 2020-03-15 DIAGNOSIS — H539 Unspecified visual disturbance: Secondary | ICD-10-CM | POA: Diagnosis not present

## 2020-03-15 DIAGNOSIS — E871 Hypo-osmolality and hyponatremia: Secondary | ICD-10-CM | POA: Diagnosis not present

## 2020-03-15 DIAGNOSIS — E1165 Type 2 diabetes mellitus with hyperglycemia: Secondary | ICD-10-CM | POA: Diagnosis present

## 2020-03-15 DIAGNOSIS — G928 Other toxic encephalopathy: Secondary | ICD-10-CM

## 2020-03-15 DIAGNOSIS — Z9114 Patient's other noncompliance with medication regimen: Secondary | ICD-10-CM

## 2020-03-15 DIAGNOSIS — F200 Paranoid schizophrenia: Secondary | ICD-10-CM | POA: Diagnosis present

## 2020-03-15 DIAGNOSIS — E7800 Pure hypercholesterolemia, unspecified: Secondary | ICD-10-CM | POA: Diagnosis present

## 2020-03-15 DIAGNOSIS — E119 Type 2 diabetes mellitus without complications: Secondary | ICD-10-CM

## 2020-03-15 DIAGNOSIS — E781 Pure hyperglyceridemia: Secondary | ICD-10-CM | POA: Diagnosis present

## 2020-03-15 LAB — GLUCOSE, CAPILLARY
Glucose-Capillary: 118 mg/dL — ABNORMAL HIGH (ref 70–99)
Glucose-Capillary: 149 mg/dL — ABNORMAL HIGH (ref 70–99)
Glucose-Capillary: 106 mg/dL — ABNORMAL HIGH (ref 70–99)
Glucose-Capillary: 133 mg/dL — ABNORMAL HIGH (ref 70–99)

## 2020-03-15 MED ORDER — TRAZODONE HCL 50 MG PO TABS
25.0000 mg | ORAL_TABLET | Freq: Every evening | ORAL | Status: DC | PRN
  Filled 2020-03-15: qty 1

## 2020-03-15 MED ORDER — DOCUSATE SODIUM 100 MG PO CAPS: 100.0000 mg | ORAL_CAPSULE | Freq: Two times a day (BID) | ORAL | 0 refills | Status: DC

## 2020-03-15 MED ORDER — ADULT MULTIVITAMIN W/MINERALS CH: 1.0000 | ORAL_TABLET | Freq: Every day | ORAL | Status: AC

## 2020-03-15 MED ORDER — INSULIN ASPART 100 UNIT/ML ~~LOC~~ SOLN: 5.0000 [IU] | Freq: Three times a day (TID) | SUBCUTANEOUS | 11 refills | Status: DC

## 2020-03-15 MED ORDER — INSULIN DETEMIR 100 UNIT/ML ~~LOC~~ SOLN: 30.0000 [IU] | Freq: Every day | SUBCUTANEOUS | 11 refills | Status: DC

## 2020-03-15 MED ORDER — GERHARDT'S BUTT CREAM
1.0000 "application " | TOPICAL_CREAM | Freq: Two times a day (BID) | CUTANEOUS | Status: DC
  Administered 2020-03-16 – 2020-04-08 (×26): 1 via TOPICAL
  Filled 2020-03-15: qty 1

## 2020-03-15 MED ORDER — METFORMIN HCL ER 500 MG PO TB24
500.0000 mg | ORAL_TABLET | Freq: Two times a day (BID) | ORAL | Status: DC
  Administered 2020-03-15 – 2020-04-08 (×47): 500 mg via ORAL
  Filled 2020-03-15 (×48): qty 1

## 2020-03-15 MED ORDER — ONDANSETRON HCL 4 MG PO TABS: 4.0000 mg | ORAL_TABLET | Freq: Four times a day (QID) | ORAL | Status: DC | PRN

## 2020-03-15 MED ORDER — QUETIAPINE FUMARATE 50 MG PO TABS
100.0000 mg | ORAL_TABLET | Freq: Every day | ORAL | Status: DC
  Administered 2020-03-15 – 2020-04-07 (×23): 100 mg via ORAL
  Filled 2020-03-15 (×2): qty 2
  Filled 2020-03-15 (×2): qty 4
  Filled 2020-03-15 (×21): qty 2

## 2020-03-15 MED ORDER — ONDANSETRON HCL 4 MG/2ML IJ SOLN: 4.0000 mg | Freq: Four times a day (QID) | INTRAMUSCULAR | Status: DC | PRN

## 2020-03-15 MED ORDER — DIPHENHYDRAMINE HCL 12.5 MG/5ML PO ELIX: 12.5000 mg | ORAL_SOLUTION | Freq: Four times a day (QID) | ORAL | Status: DC | PRN

## 2020-03-15 MED ORDER — DOCUSATE SODIUM 100 MG PO CAPS
100.0000 mg | ORAL_CAPSULE | Freq: Two times a day (BID) | ORAL | Status: DC
  Administered 2020-03-15 – 2020-04-08 (×46): 100 mg via ORAL
  Filled 2020-03-15 (×48): qty 1

## 2020-03-15 MED ORDER — METFORMIN HCL ER 500 MG PO TB24: 500.0000 mg | ORAL_TABLET | Freq: Two times a day (BID) | ORAL | Status: DC

## 2020-03-15 MED ORDER — METOPROLOL TARTRATE 50 MG PO TABS: 50.0000 mg | ORAL_TABLET | Freq: Two times a day (BID) | ORAL | 0 refills | Status: DC

## 2020-03-15 MED ORDER — INSULIN DETEMIR 100 UNIT/ML ~~LOC~~ SOLN
30.0000 [IU] | Freq: Every day | SUBCUTANEOUS | Status: DC
  Administered 2020-03-16 – 2020-03-28 (×13): 30 [IU] via SUBCUTANEOUS
  Filled 2020-03-15 (×15): qty 0.3

## 2020-03-15 MED ORDER — ALUM & MAG HYDROXIDE-SIMETH 200-200-20 MG/5ML PO SUSP: 30.0000 mL | ORAL | Status: DC | PRN

## 2020-03-15 MED ORDER — BISACODYL 10 MG RE SUPP
10.0000 mg | Freq: Every day | RECTAL | Status: DC | PRN
  Administered 2020-03-18 – 2020-03-26 (×3): 10 mg via RECTAL
  Filled 2020-03-15 (×3): qty 1

## 2020-03-15 MED ORDER — ADULT MULTIVITAMIN W/MINERALS CH
1.0000 | ORAL_TABLET | Freq: Every day | ORAL | Status: DC
  Administered 2020-03-16 – 2020-04-08 (×24): 1 via ORAL
  Filled 2020-03-15 (×24): qty 1

## 2020-03-15 MED ORDER — QUETIAPINE FUMARATE 100 MG PO TABS: 100.0000 mg | ORAL_TABLET | Freq: Every morning | ORAL | 0 refills | Status: DC

## 2020-03-15 MED ORDER — ACETAMINOPHEN 325 MG PO TABS
325.0000 mg | ORAL_TABLET | ORAL | Status: DC | PRN
  Administered 2020-03-15 – 2020-03-18 (×2): 650 mg via ORAL
  Filled 2020-03-15 (×4): qty 2

## 2020-03-15 MED ORDER — GERHARDT'S BUTT CREAM: 1.0000 "application " | TOPICAL_CREAM | Freq: Two times a day (BID) | CUTANEOUS | Status: DC

## 2020-03-15 MED ORDER — POLYETHYLENE GLYCOL 3350 17 G PO PACK
17.0000 g | PACK | Freq: Every day | ORAL | Status: DC | PRN
  Administered 2020-03-17 – 2020-03-23 (×3): 17 g via ORAL
  Filled 2020-03-15 (×3): qty 1

## 2020-03-15 MED ORDER — METOPROLOL TARTRATE 50 MG PO TABS
50.0000 mg | ORAL_TABLET | Freq: Two times a day (BID) | ORAL | Status: DC
  Administered 2020-03-15 – 2020-04-08 (×48): 50 mg via ORAL
  Filled 2020-03-15 (×48): qty 1

## 2020-03-15 MED ORDER — PROSOURCE PLUS PO LIQD
30.0000 mL | Freq: Three times a day (TID) | ORAL | Status: DC
  Administered 2020-03-15 – 2020-03-28 (×21): 30 mL via ORAL
  Filled 2020-03-15 (×26): qty 30

## 2020-03-15 MED ORDER — QUETIAPINE FUMARATE 100 MG PO TABS: 100.0000 mg | ORAL_TABLET | Freq: Every day | ORAL | Status: DC

## 2020-03-15 MED ORDER — ENOXAPARIN SODIUM 40 MG/0.4ML ~~LOC~~ SOLN
40.0000 mg | SUBCUTANEOUS | Status: DC
Start: 2020-03-15 — End: 2020-04-08
  Administered 2020-03-15 – 2020-04-07 (×24): 40 mg via SUBCUTANEOUS
  Filled 2020-03-15 (×24): qty 0.4

## 2020-03-15 MED ORDER — PANTOPRAZOLE SODIUM 40 MG PO TBEC: 40.0000 mg | DELAYED_RELEASE_TABLET | Freq: Every day | ORAL | Status: DC

## 2020-03-15 MED ORDER — PANTOPRAZOLE SODIUM 40 MG PO TBEC
40.0000 mg | DELAYED_RELEASE_TABLET | Freq: Every day | ORAL | Status: DC
  Administered 2020-03-16 – 2020-04-08 (×24): 40 mg via ORAL
  Filled 2020-03-15 (×24): qty 1

## 2020-03-15 MED ORDER — GUAIFENESIN-DM 100-10 MG/5ML PO SYRP: 5.0000 mL | ORAL_SOLUTION | Freq: Four times a day (QID) | ORAL | Status: DC | PRN

## 2020-03-15 MED ORDER — INSULIN ASPART 100 UNIT/ML ~~LOC~~ SOLN
0.0000 [IU] | Freq: Three times a day (TID) | SUBCUTANEOUS | Status: DC
  Administered 2020-03-17 – 2020-03-18 (×2): 1 [IU] via SUBCUTANEOUS

## 2020-03-15 MED ORDER — FLEET ENEMA 7-19 GM/118ML RE ENEM
1.0000 | ENEMA | Freq: Once | RECTAL | Status: DC | PRN
  Filled 2020-03-15: qty 1

## 2020-03-15 MED ORDER — INSULIN ASPART 100 UNIT/ML ~~LOC~~ SOLN
5.0000 [IU] | Freq: Three times a day (TID) | SUBCUTANEOUS | Status: DC
Start: 2020-03-16 — End: 2020-03-28
  Administered 2020-03-16 – 2020-03-28 (×33): 5 [IU] via SUBCUTANEOUS

## 2020-03-15 MED ORDER — INSULIN ASPART 100 UNIT/ML ~~LOC~~ SOLN: 0.0000 [IU] | Freq: Every day | SUBCUTANEOUS | Status: DC

## 2020-03-15 MED ORDER — QUETIAPINE FUMARATE 50 MG PO TABS
100.0000 mg | ORAL_TABLET | Freq: Every morning | ORAL | Status: DC
  Administered 2020-03-16: 100 mg via ORAL
  Filled 2020-03-15 (×2): qty 2

## 2020-03-15 NOTE — Progress Notes (Signed)
Inpatient Rehabilitation Medication Review by a Pharmacist  A complete drug regimen review was completed for this patient to identify any potential clinically significant medication issues.  Clinically significant medication issues were identified:  No  Check AMION for pharmacist assigned to patient if future medication questions/issues arise during this admission.  Time spent performing this drug regimen review (minutes):  15  Vicki Mallet, PharmD, BCPS, [facility] Clinical Pharmacist 03/15/2020 6:10 PM

## 2020-03-15 NOTE — H&P (Signed)
Physical Medicine and Rehabilitation Admission H&P        Chief Complaint  Patient presents with  . Functional deficits due to debility.       HPI: Bryan [name] is a 45 year old male with history of HTN, schizophrenia who was admitted on 01/25/20 with hypotension, lethargy and found to have newly diagnosed DM with DKA with AKI. He was treated with IVF, IV insulin and started on broad spectrum antibiotics due to leucocytosis with concerns of sepsis. On 11/30, patient found apneic and pulseless requiring two rounds of CPR with ACLS protocol with ROSC.   He was intubated and work up done revealing moderate acute pancreatitis--likely due to elevated triglycerides-600. Abdominal ultrasound showed GB sludge without acute cholecystitis and fatty liver.  2D echo showed EF 70-75% with grade I DD.    He was treated with supportive care and MRI brain done due to decrease in responsiveness-->negative for acute changes. He required tracheostomy 12/18  by Dr. Suszanne Connerseoh for VDRF. 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 He tolerated extubation and self decannulated on 01/06. Respiratory status has been stable and diet has been advanced to regular. Bouts of agitation has resolved and mentation improving but patient continued to be limited by cognitive deficits with diffuse weakness affecting overall functional status. Therapy working on pregait activity/standing attempts. Family has declined SNF and plans on providing care post discharge. CIR was recommended due to functional decline.         Review of Systems  Constitutional: Negative for chills and fever.  HENT: Negative for hearing loss and tinnitus.   Eyes: Positive for blurred vision and double vision (in the past).  Respiratory: Negative for cough and shortness of breath.   Cardiovascular: Negative for chest pain, palpitations and leg  swelling.  Gastrointestinal: Negative for abdominal pain, heartburn and nausea.  Genitourinary: Negative for dysuria and urgency.  Musculoskeletal: Negative for joint pain and myalgias.  Skin: Negative for itching and rash.  Neurological: Positive for weakness. Negative for dizziness and headaches.  Psychiatric/Behavioral: Positive for memory loss.          Past Medical History:  Diagnosis Date  . Hypertension    . Psychiatric problem      [facility]. Takes seroquel. History of hearing voices.  Not commandivng voices.   . Schizophrenia (HCC)    . Tremors of nervous system        Past Surgical History:  Procedure Laterality Date  . none      . TRACHEOSTOMY TUBE PLACEMENT N/A 02/13/2020    Procedure: TRACHEOSTOMY;  Surgeon: Newman Pieseoh, Su, MD;  [facility];  Service: ENT;  Laterality: N/A;      History reviewed. No pertinent family history--unable to elicit due to cognitive deficits.    Social History:  Was living alone in an apartment and plans on d/c with mother who is a CNA?  He reports that he has been smoking cigarettes. He has been smoking about 0.50 packs per day. He has never used smokeless tobacco. Per reports he used alcohol on rare occasion. Per reports current drug use. Drugs: Cocaine and Marijuana.           Allergies  Allergen Reactions  . Fluphenazine Shortness Of Breath and Swelling      Tongue swelling, and shaking  . Coffee Bean Extract [Coffea Arabica] Swelling  .  Vancomycin Rash      Bullous rash over trunk, arms, and face           Medications Prior to Admission  Medication Sig Dispense Refill  . ARIPiprazole ER (ABILIFY MAINTENA) 400 MG SRER injection Inject 2 mLs (400 mg total) into the muscle every 28 (twenty-eight) days. (Due on 04-12-2018): For mood control 1 each 0  . benztropine (COGENTIN) 0.5 MG tablet Take 1 tablet (0.5 mg total) by mouth 2 (two) times daily. For prevention of drug induced tremors 60 tablet 0  .  hydrOXYzine (ATARAX/VISTARIL) 25 MG tablet Take 1 tablet (25 mg total) by mouth every 6 (six) hours as needed for anxiety. 60 tablet 0  . temazepam (RESTORIL) 30 MG capsule Take 1 capsule (30 mg total) by mouth at bedtime. For sleep 30 capsule 0  . traZODone (DESYREL) 50 MG tablet Take 1 tablet (50 mg total) by mouth at bedtime as needed for sleep. 30 tablet 0  . HYDROcodone-acetaminophen (NORCO/VICODIN) 5-325 MG tablet Take 1 tablet by mouth every 8 (eight) hours as needed. (Patient not taking: Reported on 01/25/2020) 12 tablet 0  . ibuprofen (ADVIL) 600 MG tablet Take 1 tablet (600 mg total) by mouth every 8 (eight) hours as needed. (Patient not taking: Reported on 01/25/2020) 30 tablet 0  . risperiDONE (RISPERDAL) 3 MG tablet Take 1 tablet (3 mg total) by mouth 2 (two) times daily. For mood control (Patient not taking: Reported on 01/25/2020) 60 tablet 0      Drug Regimen Review  Drug regimen was reviewed and remains appropriate with no significant issues identified   Home: Home Living Family/patient expects to be discharged to:: Other (Comment) ([facility]) Living Arrangements: Alone Available Help at Discharge: Family Type of Home: Apartment Home Access: Stairs to enter Secretary/administrator of Steps: 16 Entrance Stairs-Rails: Right Home Layout: One level Bathroom Shower/Tub: Engineer, manufacturing systems: Standard Bathroom Accessibility: Yes Additional Comments: per chart pt was living with brothers PTA, pt currently unable to provide home setup.  Lives With: Alone   Functional History: Prior Function Level of Independence: Independent Comments: suspect indep from mobility staindpoint however suspect pt needs assist for IADLs due to psych history   Functional Status:  Mobility: Bed Mobility Overal bed mobility: Needs Assistance Bed Mobility: Supine to Sit,Sit to Supine Rolling: Min guard Sidelying to sit: Mod assist Supine to sit: Min assist Sit to supine: Min  assist General bed mobility comments: min assist for supine<>sit for trunk and LE management, assist for boost up in bed upon return to supine. Transfers Overall transfer level: Needs assistance Equipment used: Rolling walker (2 wheeled) Transfer via Lift Equipment: Stedy Transfers: Sit to/from Stand Sit to Stand: Mod assist Stand pivot transfers: Mod assist Squat pivot transfers: Max assist,+2 physical assistance,+2 safety/equipment  Lateral/Scoot Transfers: Max assist,+2 safety/equipment,+2 physical assistance General transfer comment: Mod assist for power up, steady, physically placing hands on RW. Verbal cuing for hand placement when rising/sitting, tremulous and ataxic once standing not improved with weighted input through pt shoulders. STS x3 from EOB. Ambulation/Gait Ambulation/Gait assistance: Mod assist,+2 physical assistance Gait Distance (Feet): 4 Feet (4'x2) Assistive device: Rolling walker (2 wheeled) Gait Pattern/deviations: Step-to pattern,Decreased stride length,Ataxic General Gait Details: lateral stepping and forward stepping only; pregait activity requiring mod assist for weight shifting, physically translating LEs, and steadying Gait velocity: decreased   ADL: ADL Overall ADL's : Needs assistance/impaired Eating/Feeding: NPO Eating/Feeding Details (indicate cue type and reason): He is now allowed to eat. Grooming: Wash/dry face,Wash/dry  hands,Sitting,Min guard Grooming Details (indicate cue type and reason): ataxia, tremors noted. Upper Body Dressing : Moderate assistance,Sitting Toilet Transfer: Maximal assistance,+2 for safety/equipment,BSC Toileting- Clothing Manipulation and Hygiene: Total assistance,Bed level Toileting - Clothing Manipulation Details (indicate cue type and reason): Pt incontinent of uring.  Required total A for peri care Functional mobility during ADLs: Moderate assistance,Rolling walker General ADL Comments: pt will require total (A) for all  bathin and dressing task but demonstrates (A) with basic transfer this session   Cognition: Cognition Overall Cognitive Status: Impaired/Different from baseline Arousal/Alertness: Awake/alert Orientation Level: Oriented to person,Oriented to place Attention: Sustained Sustained Attention: Appears intact Memory: Impaired Memory Impairment: Retrieval deficit Awareness: Impaired Awareness Impairment: Intellectual impairment,Emergent impairment,Anticipatory impairment Problem Solving: Impaired Problem Solving Impairment: Verbal basic,Functional basic Safety/Judgment: Impaired Cognition Arousal/Alertness: Awake/alert Behavior During Therapy: Flat affect Overall Cognitive Status: Impaired/Different from baseline Area of Impairment: Attention,Memory,Following commands,Safety/judgement,Awareness,Problem solving,Orientation Orientation Level: Disoriented to,Time Current Attention Level: Sustained Memory: Decreased recall of precautions,Decreased short-term memory Following Commands: Follows one step commands consistently,Follows multi-step commands inconsistently Safety/Judgement: Decreased awareness of safety,Decreased awareness of deficits Awareness: Intellectual Problem Solving: Slow processing,Decreased initiation,Difficulty sequencing,Requires tactile cues,Requires verbal cues General Comments: Flat affect, when PT asked why pt is in the hospital pt states "because I wasn't taking care of myself". Pt requires increased time to follow commands, but does so 100% of the time today. Motivated to get stronger     Blood pressure 111/80, pulse 88, temperature 98.8 F (37.1 C), temperature source Oral, resp. rate 18, height 5\' 10"  (1.778 m), weight 91.6 kg, SpO2 96 %. Physical Exam Vitals reviewed.  Constitutional:      Appearance: Normal appearance.  HENT:     Head: Normocephalic and atraumatic.     Nose: No congestion.     Mouth/Throat:     Mouth: Mucous membranes are moist.      Pharynx: Oropharynx is clear.  Eyes:     Extraocular Movements: Extraocular movements intact.  Neck:     Comments: Well healed prior trach site Cardiovascular:     Rate and Rhythm: Normal rate and regular rhythm.     Heart sounds: No murmur heard. No gallop.   Pulmonary:     Effort: Pulmonary effort is normal. No respiratory distress.     Breath sounds: No stridor. No wheezing or rhonchi.  Abdominal:     General: Abdomen is flat. There is distension.     Palpations: Abdomen is soft.     Tenderness: There is no abdominal tenderness.  Musculoskeletal:        General: No tenderness. Normal range of motion.     Cervical back: Normal range of motion.  Skin:    Comments: Left ear lobe with well healed wound--sensitive to touch. Left heel blister clean, foam dressing intact  Neurological:     Mental Status: He is alert.     Comments: Oriented to self, place, DOB and age as 33. He though it was Feb and the month prior as May. Delayed output --reported "I forgot/I don't know" to most biographic questions. Very delayed processing as a whole. He was able to follow simple motor commands with occasional cues.  Diffuse weakness with ataxia BUE. UE grossly 4-/5 prox to 4/5 distally bilaterally. RLE 3-/5 prox to 3+distally. LLE 3+ prox to 4/5 distally. ?sensory loss to LT in Right leg.   Psychiatric:     Comments: Flat but generally pleasant and cooperative        Lab Results Last 48 Hours  Results for orders placed or performed during the hospital encounter of 01/25/20 (from the past 48 hour(s))  Glucose, capillary     Status: Abnormal    Collection Time: 03/13/20 12:45 PM  Result Value Ref Range    Glucose-Capillary 109 (H) 70 - 99 mg/dL      Comment: Glucose reference range applies only to samples taken after fasting for at least 8 hours.  Glucose, capillary     Status: Abnormal    Collection Time: 03/13/20  4:53 PM  Result Value Ref Range    Glucose-Capillary 162 (H) 70 - 99 mg/dL       Comment: Glucose reference range applies only to samples taken after fasting for at least 8 hours.  Glucose, capillary     Status: Abnormal    Collection Time: 03/13/20  8:57 PM  Result Value Ref Range    Glucose-Capillary 107 (H) 70 - 99 mg/dL      Comment: Glucose reference range applies only to samples taken after fasting for at least 8 hours.  Glucose, capillary     Status: Abnormal    Collection Time: 03/14/20  7:56 AM  Result Value Ref Range    Glucose-Capillary 114 (H) 70 - 99 mg/dL      Comment: Glucose reference range applies only to samples taken after fasting for at least 8 hours.  Glucose, capillary     Status: Abnormal    Collection Time: 03/14/20 11:58 AM  Result Value Ref Range    Glucose-Capillary 109 (H) 70 - 99 mg/dL      Comment: Glucose reference range applies only to samples taken after fasting for at least 8 hours.  Glucose, capillary     Status: Abnormal    Collection Time: 03/14/20  4:40 PM  Result Value Ref Range    Glucose-Capillary 120 (H) 70 - 99 mg/dL      Comment: Glucose reference range applies only to samples taken after fasting for at least 8 hours.  Glucose, capillary     Status: Abnormal    Collection Time: 03/14/20  8:57 PM  Result Value Ref Range    Glucose-Capillary 121 (H) 70 - 99 mg/dL      Comment: Glucose reference range applies only to samples taken after fasting for at least 8 hours.  Glucose, capillary     Status: Abnormal    Collection Time: 03/15/20  8:22 AM  Result Value Ref Range    Glucose-Capillary 118 (H) 70 - 99 mg/dL      Comment: Glucose reference range applies only to samples taken after fasting for at least 8 hours.  Glucose, capillary     Status: Abnormal    Collection Time: 03/15/20 12:02 PM  Result Value Ref Range    Glucose-Capillary 149 (H) 70 - 99 mg/dL      Comment: Glucose reference range applies only to samples taken after fasting for at least 8 hours.      Imaging Results (Last 48 hours)  No results found.            Medical Problem List and Plan: 1.  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  Antithrombotics: -DVT/anticoagulation:  Pharmaceutical: Lovenox             -  antiplatelet therapy: N/A 3. Pain Management: N/A 4. Mood: LCSW to follow for evaluation and support.              -antipsychotic agents: N/A 5. Neuropsych: This patient is not capable of making decisions on his own behalf. 6. Skin/Wound Care:  Routine pressure relief measures.  7. Fluids/Electrolytes/Nutrition: Monitor I/O.  8. T2DM: New diagnosis with A1C-13.4--->recheck in am. Will monitor BS ac/hs--modify diet to carb controlled. Will continue Levemir with metformin and meal coverage. Use SSI for elevated BS --anticipate BS to improve with increase in activity.  -Titrate meds as indicated.  9. Acute blood loss anemia: H/H improving up to 11.9.  10. Elevated Triglycerides: Were improving down to 128 but trending back up to 505. Will recheck in am along with LFTs.  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 Last documented admission 11/20 (history of non-compliance)  Currently off Abiligy, Cogentin and Risperdal during hospitalization. On Seroquel 100 mg bid and tends to fall asleep easily.              -Will order sleep wake chart.                -behavior pretty docile at this time           Jacquelynn Creeamela S Love, PA-C 03/15/2020  I have personally performed a face to face diagnostic evaluation of this patient and formulated the key components of the plan.  Additionally, I have personally reviewed laboratory data, imaging studies, as well as relevant notes and concur with the physician assistant's documentation above.  The patient's status has not changed from the original H&P.  Any changes in documentation from the acute care chart have been noted above.  Ranelle OysterZachary T. Topher Buenaventura, MD,  [place] DomFAAPMR

## 2020-03-15 NOTE — Plan of Care (Signed)
  Problem: Clinical Measurements: Goal: Ability to maintain clinical measurements within normal limits will improve Outcome: Progressing Goal: Will remain free from infection Outcome: Progressing Goal: Diagnostic test results will improve Outcome: Progressing Goal: Respiratory complications will improve Outcome: Progressing   Problem: Nutrition: Goal: Adequate nutrition will be maintained Outcome: Progressing   Problem: Coping: Goal: Level of anxiety will decrease Outcome: Progressing   Problem: Safety: Goal: Ability to remain free from injury will improve Outcome: Progressing   Problem: Skin Integrity: Goal: Risk for impaired skin integrity will decrease Outcome: Progressing   Problem: Education: Goal: Ability to describe self-care measures that may prevent or decrease complications (Diabetes Survival Skills Education) will improve Outcome: Progressing   Problem: Health Behavior/Discharge Planning: Goal: Ability to identify and utilize available resources and services will improve Outcome: Progressing Goal: Ability to manage health-related needs will improve Outcome: Progressing   Problem: Fluid Volume: Goal: Ability to achieve a balanced intake and output will improve Outcome: Progressing   Problem: Respiratory: Goal: Will regain and/or maintain adequate ventilation Outcome: Progressing   Problem: Urinary Elimination: Goal: Ability to achieve and maintain adequate renal perfusion and functioning will improve Outcome: Progressing

## 2020-03-15 NOTE — H&P (Signed)
Physical Medicine and Rehabilitation Admission H&P    Chief Complaint  Patient presents with  . Functional deficits due to debility.     HPI: Bryan [name] is a 45 year old male with history of HTN, schizophrenia who was admitted on 01/25/20 with hypotension, lethargy and found to have newly diagnosed DM with DKA with AKI. He was treated with IVF, IV insulin and started on broad spectrum antibiotics due to leucocytosis with concerns of sepsis. On 11/30, patient found apneic and pulseless requiring two rounds of CPR with ACLS protocol with ROSC.   He was intubated and work up done revealing moderate acute pancreatitis--likely due to elevated triglycerides-600. Abdominal ultrasound showed GB sludge without acute cholecystitis and fatty liver.  2D echo showed EF 70-75% with grade I DD.   He was treated with supportive care and MRI brain done due to decrease in responsiveness-->negative for acute changes. He required tracheostomy 12/18  by Dr. Suszanne Conners for VDRF. 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 He tolerated extubation and self decannulated on 01/06. Respiratory status has been stable and diet has been advanced to regular. Bouts of agitation has resolved and mentation improving but patient continued to be limited by cognitive deficits with diffuse weakness affecting overall functional status. Therapy working on pregait activity/standing attempts. Family has declined SNF and plans on providing care post discharge. CIR was recommended due to functional decline.       Review of Systems  Constitutional: Negative for chills and fever.  HENT: Negative for hearing loss and tinnitus.   Eyes: Positive for blurred vision and double vision (in the past).  Respiratory: Negative for cough and shortness of breath.   Cardiovascular: Negative for chest pain, palpitations and leg swelling.   Gastrointestinal: Negative for abdominal pain, heartburn and nausea.  Genitourinary: Negative for dysuria and urgency.  Musculoskeletal: Negative for joint pain and myalgias.  Skin: Negative for itching and rash.  Neurological: Positive for weakness. Negative for dizziness and headaches.  Psychiatric/Behavioral: Positive for memory loss.    Past Medical History:  Diagnosis Date  . Hypertension   . Psychiatric problem    Seen at [facility] for unknown reason. Takes seroquel. History of hearing voices.  Not commandivng voices.   . Schizophrenia (HCC)   . Tremors of nervous system     Past Surgical History:  Procedure Laterality Date  . none    . TRACHEOSTOMY TUBE PLACEMENT N/A 02/13/2020   Procedure: TRACHEOSTOMY;  Surgeon: Newman Pies, MD;  Location: [facility] OR;  Service: ENT;  Laterality: N/A;    History reviewed. No pertinent family history--unable to elicit due to cognitive deficits.   Social History:  Was living alone in an apartment and plans on d/c with mother who is a CNA?  He reports that he has been smoking cigarettes. He has been smoking about 0.50 packs per day. He has never used smokeless tobacco. Per reports he used alcohol on rare occasion. Per reports current drug use. Drugs: Cocaine and Marijuana.     Allergies  Allergen Reactions  . Fluphenazine Shortness Of Breath and Swelling    Tongue swelling, and shaking  . Coffee Bean Extract [Coffea Arabica] Swelling  . Vancomycin Rash    Bullous rash over trunk, arms, and face    Medications Prior to Admission  Medication Sig Dispense Refill  . ARIPiprazole ER (ABILIFY MAINTENA) 400 MG SRER injection Inject 2 mLs (  400 mg total) into the muscle every 28 (twenty-eight) days. (Due on 04-12-2018): For mood control 1 each 0  . benztropine (COGENTIN) 0.5 MG tablet Take 1 tablet (0.5 mg total) by mouth 2 (two) times daily. For prevention of drug induced tremors 60 tablet 0  . hydrOXYzine (ATARAX/VISTARIL) 25 MG tablet Take 1 tablet  (25 mg total) by mouth every 6 (six) hours as needed for anxiety. 60 tablet 0  . temazepam (RESTORIL) 30 MG capsule Take 1 capsule (30 mg total) by mouth at bedtime. For sleep 30 capsule 0  . traZODone (DESYREL) 50 MG tablet Take 1 tablet (50 mg total) by mouth at bedtime as needed for sleep. 30 tablet 0  . HYDROcodone-acetaminophen (NORCO/VICODIN) 5-325 MG tablet Take 1 tablet by mouth every 8 (eight) hours as needed. (Patient not taking: Reported on 01/25/2020) 12 tablet 0  . ibuprofen (ADVIL) 600 MG tablet Take 1 tablet (600 mg total) by mouth every 8 (eight) hours as needed. (Patient not taking: Reported on 01/25/2020) 30 tablet 0  . risperiDONE (RISPERDAL) 3 MG tablet Take 1 tablet (3 mg total) by mouth 2 (two) times daily. For mood control (Patient not taking: Reported on 01/25/2020) 60 tablet 0    Drug Regimen Review  Drug regimen was reviewed and remains appropriate with no significant issues identified  Home: Home Living Family/patient expects to be discharged to:: Other (Comment) ([facility]) Living Arrangements: Alone Available Help at Discharge: Family Type of Home: Apartment Home Access: Stairs to enter Secretary/administratorntrance Stairs-Number of Steps: 16 Entrance Stairs-Rails: Right Home Layout: One level Bathroom Shower/Tub: Engineer, manufacturing systemsTub/shower unit Bathroom Toilet: Standard Bathroom Accessibility: Yes Additional Comments: per chart pt was living with brothers PTA, pt currently unable to provide home setup.  Lives With: Alone   Functional History: Prior Function Level of Independence: Independent Comments: suspect indep from mobility staindpoint however suspect pt needs assist for IADLs due to psych history  Functional Status:  Mobility: Bed Mobility Overal bed mobility: Needs Assistance Bed Mobility: Supine to Sit,Sit to Supine Rolling: Min guard Sidelying to sit: Mod assist Supine to sit: Min assist Sit to supine: Min assist General bed mobility comments: min assist for supine<>sit for  trunk and LE management, assist for boost up in bed upon return to supine. Transfers Overall transfer level: Needs assistance Equipment used: Rolling walker (2 wheeled) Transfer via Lift Equipment: Stedy Transfers: Sit to/from Stand Sit to Stand: Mod assist Stand pivot transfers: Mod assist Squat pivot transfers: Max assist,+2 physical assistance,+2 safety/equipment  Lateral/Scoot Transfers: Max assist,+2 safety/equipment,+2 physical assistance General transfer comment: Mod assist for power up, steady, physically placing hands on RW. Verbal cuing for hand placement when rising/sitting, tremulous and ataxic once standing not improved with weighted input through pt shoulders. STS x3 from EOB. Ambulation/Gait Ambulation/Gait assistance: Mod assist,+2 physical assistance Gait Distance (Feet): 4 Feet (4'x2) Assistive device: Rolling walker (2 wheeled) Gait Pattern/deviations: Step-to pattern,Decreased stride length,Ataxic General Gait Details: lateral stepping and forward stepping only; pregait activity requiring mod assist for weight shifting, physically translating LEs, and steadying Gait velocity: decreased    ADL: ADL Overall ADL's : Needs assistance/impaired Eating/Feeding: NPO Eating/Feeding Details (indicate cue type and reason): He is now allowed to eat. Grooming: Wash/dry face,Wash/dry hands,Sitting,Min guard Grooming Details (indicate cue type and reason): ataxia, tremors noted. Upper Body Dressing : Moderate assistance,Sitting Toilet Transfer: Maximal assistance,+2 for safety/equipment,BSC Toileting- Clothing Manipulation and Hygiene: Total assistance,Bed level Toileting - Clothing Manipulation Details (indicate cue type and reason): Pt incontinent of uring.  Required total  A for peri care Functional mobility during ADLs: Moderate assistance,Rolling walker General ADL Comments: pt will require total (A) for all bathin and dressing task but demonstrates (A) with basic transfer  this session  Cognition: Cognition Overall Cognitive Status: Impaired/Different from baseline Arousal/Alertness: Awake/alert Orientation Level: Oriented to person,Oriented to place Attention: Sustained Sustained Attention: Appears intact Memory: Impaired Memory Impairment: Retrieval deficit Awareness: Impaired Awareness Impairment: Intellectual impairment,Emergent impairment,Anticipatory impairment Problem Solving: Impaired Problem Solving Impairment: Verbal basic,Functional basic Safety/Judgment: Impaired Cognition Arousal/Alertness: Awake/alert Behavior During Therapy: Flat affect Overall Cognitive Status: Impaired/Different from baseline Area of Impairment: Attention,Memory,Following commands,Safety/judgement,Awareness,Problem solving,Orientation Orientation Level: Disoriented to,Time Current Attention Level: Sustained Memory: Decreased recall of precautions,Decreased short-term memory Following Commands: Follows one step commands consistently,Follows multi-step commands inconsistently Safety/Judgement: Decreased awareness of safety,Decreased awareness of deficits Awareness: Intellectual Problem Solving: Slow processing,Decreased initiation,Difficulty sequencing,Requires tactile cues,Requires verbal cues General Comments: Flat affect, when PT asked why pt is in the hospital pt states "because I wasn't taking care of myself". Pt requires increased time to follow commands, but does so 100% of the time today. Motivated to get stronger   Blood pressure 111/80, pulse 88, temperature 98.8 F (37.1 C), temperature source Oral, resp. rate 18, height 5\' 10"  (1.778 m), weight 91.6 kg, SpO2 96 %. Physical Exam Vitals reviewed.  Constitutional:      Appearance: Normal appearance.  HENT:     Head: Normocephalic and atraumatic.     Nose: No congestion.     Mouth/Throat:     Mouth: Mucous membranes are moist.     Pharynx: Oropharynx is clear.  Eyes:     Extraocular Movements:  Extraocular movements intact.  Neck:     Comments: Well healed prior trach site Cardiovascular:     Rate and Rhythm: Normal rate and regular rhythm.     Heart sounds: No murmur heard. No gallop.   Pulmonary:     Effort: Pulmonary effort is normal. No respiratory distress.     Breath sounds: No stridor. No wheezing or rhonchi.  Abdominal:     General: Abdomen is flat. There is distension.     Palpations: Abdomen is soft.     Tenderness: There is no abdominal tenderness.  Musculoskeletal:        General: No tenderness. Normal range of motion.     Cervical back: Normal range of motion.  Skin:    Comments: Left ear lobe with well healed wound--sensitive to touch. Left heel blister clean, foam dressing intact  Neurological:     Mental Status: He is alert.     Comments: Oriented to self, place, DOB and age as 89. He though it was Feb and the month prior as May. Delayed output --reported "I forgot/I don't know" to most biographic questions. Very delayed processing as a whole. He was able to follow simple motor commands with occasional cues.  Diffuse weakness with ataxia BUE. UE grossly 4-/5 prox to 4/5 distally bilaterally. RLE 3-/5 prox to 3+distally. LLE 3+ prox to 4/5 distally. ?sensory loss to LT in Right leg.   Psychiatric:     Comments: Flat but generally pleasant and cooperative     Results for orders placed or performed during the hospital encounter of 01/25/20 (from the past 48 hour(s))  Glucose, capillary     Status: Abnormal   Collection Time: 03/13/20 12:45 PM  Result Value Ref Range   Glucose-Capillary 109 (H) 70 - 99 mg/dL    Comment: Glucose reference range applies only to samples taken after fasting  for at least 8 hours.  Glucose, capillary     Status: Abnormal   Collection Time: 03/13/20  4:53 PM  Result Value Ref Range   Glucose-Capillary 162 (H) 70 - 99 mg/dL    Comment: Glucose reference range applies only to samples taken after fasting for at least 8 hours.   Glucose, capillary     Status: Abnormal   Collection Time: 03/13/20  8:57 PM  Result Value Ref Range   Glucose-Capillary 107 (H) 70 - 99 mg/dL    Comment: Glucose reference range applies only to samples taken after fasting for at least 8 hours.  Glucose, capillary     Status: Abnormal   Collection Time: 03/14/20  7:56 AM  Result Value Ref Range   Glucose-Capillary 114 (H) 70 - 99 mg/dL    Comment: Glucose reference range applies only to samples taken after fasting for at least 8 hours.  Glucose, capillary     Status: Abnormal   Collection Time: 03/14/20 11:58 AM  Result Value Ref Range   Glucose-Capillary 109 (H) 70 - 99 mg/dL    Comment: Glucose reference range applies only to samples taken after fasting for at least 8 hours.  Glucose, capillary     Status: Abnormal   Collection Time: 03/14/20  4:40 PM  Result Value Ref Range   Glucose-Capillary 120 (H) 70 - 99 mg/dL    Comment: Glucose reference range applies only to samples taken after fasting for at least 8 hours.  Glucose, capillary     Status: Abnormal   Collection Time: 03/14/20  8:57 PM  Result Value Ref Range   Glucose-Capillary 121 (H) 70 - 99 mg/dL    Comment: Glucose reference range applies only to samples taken after fasting for at least 8 hours.  Glucose, capillary     Status: Abnormal   Collection Time: 03/15/20  8:22 AM  Result Value Ref Range   Glucose-Capillary 118 (H) 70 - 99 mg/dL    Comment: Glucose reference range applies only to samples taken after fasting for at least 8 hours.  Glucose, capillary     Status: Abnormal   Collection Time: 03/15/20 12:02 PM  Result Value Ref Range   Glucose-Capillary 149 (H) 70 - 99 mg/dL    Comment: Glucose reference range applies only to samples taken after fasting for at least 8 hours.   No results found.     Medical Problem List and Plan: 1.  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  Antithrombotics: -DVT/anticoagulation:  Pharmaceutical: Lovenox  -antiplatelet therapy: N/A 3. Pain Management: N/A 4. Mood: LCSW to follow for evaluation and support.   -antipsychotic agents: N/A 5. Neuropsych: This patient is not capable of making decisions on his own behalf. 6. Skin/Wound Care:  Routine pressure relief measures.  7. Fluids/Electrolytes/Nutrition: Monitor I/O.  8. T2DM: New diagnosis with A1C-13.4--->recheck in am. Will monitor BS ac/hs--modify diet to carb controlled. Will continue Levemir with metformin and meal coverage. Use SSI for elevated BS --anticipate BS to improve with increase in activity. Titrate meds as indicated.  9. Acute blood loss anemia: H/H improving up to 11.9.  10. Elevated Triglycerides: Were improving down to 128 but trending back up to 505. Will recheck in am along with LFTs.  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 Last documented admission 11/20 (history of non-compliance)  Currently off Abiligy, Cogentin and Risperdal during hospitalization. On  Seroquel 100 mg bid and tends to fall asleep easily.   -Will order sleep wake chart.     -behavior pretty docile at this time       Jacquelynn Cree, PA-C 03/15/2020

## 2020-03-15 NOTE — Progress Notes (Signed)
Patient ID: Bryan [name], male   DOB: [birth year], 45 y.o.   MRN: [id number] Admitted to the unit, oriented to unit, rehab routine and plan of care. Discussed medicines and treatment with mother as well. Pamelia Hoit

## 2020-03-15 NOTE — Progress Notes (Signed)
Inpatient Rehab Admissions Coordinator:    I have a CIR bed for this Pt. Today. I have notified pt.'s mother  reviewed insurance information, and received necessary consent. RN may call [phone] for report.  Megan Salon, MS, CCC-SLP Rehab Admissions Coordinator  [phone] (celll) [phone] (office)

## 2020-03-15 NOTE — Progress Notes (Signed)
PMR Admission Coordinator Pre-Admission Assessment  Patient: Bryan [name] is an 44 y.o., male MRN: [id number] DOB: 12/29/1975 Height: 5' 10" (177.8 cm) Weight: 91.6 kg  Insurance Information HMO:     PPO:      PCP:      IPA:      80/20:      OTHER:  PRIMARY: Medicaid [place] Access      Policy#: [id number]  Verified online 03/15/20     Subscriber: Pt.  CM Name:     Phone#:      Fax#:  Pre-Cert#:       Employer:  Benefits:  Phone #:      Name:  Eff. Date: currently in effect       Deduct:       Out of Pocket Max:       Life Max:  CIR: covered per medicaid guidelines   SNF:  Outpatient:     Co-Pay:  Home Health:       Co-Pay:  DME:      Co-Pay:  Providers:  SECONDARY: None       Policy#:      Phone#:   The "Data Collection Information Summary" for patients in Inpatient Rehabilitation Facilities with attached "Privacy Act Statement-Health Care Records" was provided and verbally reviewed with: Family  Emergency Contact Information Contact Information    Name Relation Home Work Mobile   Simmons,[name] Mother [phone]     Guy,Lilly Grandmother [phone]        Current Medical History  Patient Admitting Diagnosis: DKA History of Present Illness: Pt is a 44 y.o. man with history of schizophrenia who presented to [facility] ED on 01/25/20 with acute encephalopathy and lethargy and was admitted for DKA. He was transferred to the ICU on 11/30 with agitation and hypotension, had subsequent PEA arrest. Found to have acute pancreatitis with unclear etiology though found to have elevated triglycerides. Encephalopathy felt to be secondary to anoxia. Pt is s/p trach on 12/18 due to ventilator dependence, decannulated 03/03/20. Subsequently spiked another fever on 12/19 with CXR findings suspicious for multifocal pneumonia.     Patient's medical record from [facility]  has been reviewed by the rehabilitation admission coordinator and physician.  Past Medical History  Past  Medical History:  Diagnosis Date  . Hypertension   . Psychiatric problem    Seen at [facility] for unknown reason. Takes seroquel. History of hearing voices.  Not commandivng voices.   . Schizophrenia (HCC)   . Tremors of nervous system     Family History   family history is not on file.  Prior Rehab/Hospitalizations Has the patient had prior rehab or hospitalizations prior to admission? Yes  Has the patient had major surgery during 100 days prior to admission? Yes   Current Medications  Current Facility-Administered Medications:  .  (feeding supplement) PROSource Plus liquid 30 mL, 30 mL, Oral, TID BM, Hoffman, Erik C, DO, 30 mL at 03/15/20 1049 .  acetaminophen (TYLENOL) tablet 650 mg, 650 mg, Oral, Q6H PRN, Hoffman, Erik C, DO, 650 mg at 03/07/20 2150 .  docusate sodium (COLACE) capsule 100 mg, 100 mg, Oral, BID, Hoffman, Erik C, DO, 100 mg at 03/15/20 1051 .  enoxaparin (LOVENOX) injection 40 mg, 40 mg, Subcutaneous, Q24H, Katsadouros, Vasilios, MD, 40 mg at 03/14/20 1735 .  feeding supplement (ENSURE ENLIVE / ENSURE PLUS) liquid 237 mL, 237 mL, Oral, TID BM, Hoffman, Erik C, DO, 237 mL at 03/15/20 1052 .  Gerhardt's butt cream, ,   Topical, BID, Katsadouros, Vasilios, MD, Given at 03/15/20 1049 .  insulin aspart (novoLOG) injection 0-15 Units, 0-15 Units, Subcutaneous, TID WC, Katsadouros, Vasilios, MD, 3 Units at 03/13/20 1658 .  insulin aspart (novoLOG) injection 5 Units, 5 Units, Subcutaneous, TID WC, Hoffman, Erik C, DO, 5 Units at 03/14/20 1735 .  insulin detemir (LEVEMIR) injection 30 Units, 30 Units, Subcutaneous, Daily, Hoffman, Erik C, DO, 30 Units at 03/14/20 0942 .  metFORMIN (GLUCOPHAGE-XR) 24 hr tablet 500 mg, 500 mg, Oral, BID WC, Watson, Julia, MD, 500 mg at 03/14/20 1735 .  metoprolol tartrate (LOPRESSOR) tablet 50 mg, 50 mg, Oral, BID, Hoffman, Erik C, DO, 50 mg at 03/15/20 1051 .  multivitamin with minerals tablet 1 tablet, 1 tablet, Oral, Daily, Hoffman, Erik C, DO,  1 tablet at 03/15/20 1051 .  pantoprazole (PROTONIX) EC tablet 40 mg, 40 mg, Oral, Daily, Hoffman, Erik C, DO, 40 mg at 03/15/20 1051 .  polyethylene glycol (MIRALAX / GLYCOLAX) packet 17 g, 17 g, Oral, Daily PRN, Hoffman, Erik C, DO .  QUEtiapine (SEROQUEL) tablet 100 mg, 100 mg, Oral, q morning - 10a, Hoffman, Erik C, DO, 100 mg at 03/15/20 1051 .  QUEtiapine (SEROQUEL) tablet 100 mg, 100 mg, Oral, QHS, Hoffman, Erik C, DO, 100 mg at 03/14/20 2106  Patients Current Diet:  Diet Order            Diet - low sodium heart healthy           Diet regular Room service appropriate? Yes; Fluid consistency: Thin  Diet effective now                 Precautions / Restrictions Precautions Precautions: Fall Precaution Comments: condom cath Other Brace: L wrist cock up splint to be worn during day per RN (not donned during session) Restrictions Weight Bearing Restrictions: No   Has the patient had 2 or more falls or a fall with injury in the past year? Yes  Prior Activity Level    Prior Functional Level Self Care: Did the patient need help bathing, dressing, using the toilet or eating? Independent  Indoor Mobility: Did the patient need assistance with walking from room to room (with or without device)? Independent  Stairs: Did the patient need assistance with internal or external stairs (with or without device)? Independent  Functional Cognition: Did the patient need help planning regular tasks such as shopping or remembering to take medications? Needed some help  Home Assistive Devices / Equipment Home Assistive Devices/Equipment: None  Prior Device Use: Indicate devices/aids used by the patient prior to current illness, exacerbation or injury? None of the above  Current Functional Level Cognition  Arousal/Alertness: Awake/alert Overall Cognitive Status: Impaired/Different from baseline Current Attention Level: Sustained Orientation Level: Oriented to person,Oriented to  place Following Commands: Follows one step commands consistently,Follows multi-step commands inconsistently Safety/Judgement: Decreased awareness of safety,Decreased awareness of deficits General Comments: Flat affect, when PT asked why pt is in the hospital pt states "because I wasn't taking care of myself". Pt requires increased time to follow commands, but does so 100% of the time today. Motivated to get stronger Attention: Sustained Sustained Attention: Appears intact Memory: Impaired Memory Impairment: Retrieval deficit Awareness: Impaired Awareness Impairment: Intellectual impairment,Emergent impairment,Anticipatory impairment Problem Solving: Impaired Problem Solving Impairment: Verbal basic,Functional basic Safety/Judgment: Impaired    Extremity Assessment (includes Sensation/Coordination)  Upper Extremity Assessment: RUE deficits/detail,LUE deficits/detail RUE Deficits / Details: atatic movements. difficulty using functionally. RUE Coordination: decreased fine motor LUE Deficits / Details: atatic   movement. difficulty moving functionally LUE Coordination: decreased fine motor,decreased gross motor  Lower Extremity Assessment: Defer to PT evaluation RLE Deficits / Details: pt with minimal volutnary movement, increased tone/stiffness LLE Deficits / Details: minimal voluntary movement, increased tone/stiffness    ADLs  Overall ADL's : Needs assistance/impaired Eating/Feeding: NPO Eating/Feeding Details (indicate cue type and reason): He is now allowed to eat. Grooming: Wash/dry face,Wash/dry hands,Sitting,Min guard Grooming Details (indicate cue type and reason): ataxia, tremors noted. Upper Body Dressing : Moderate assistance,Sitting Toilet Transfer: Maximal assistance,+2 for safety/equipment,BSC Toileting- Clothing Manipulation and Hygiene: Total assistance,Bed level Toileting - Clothing Manipulation Details (indicate cue type and reason): Pt incontinent of uring.  Required  total A for peri care Functional mobility during ADLs: Moderate assistance,Rolling walker General ADL Comments: pt will require total (A) for all bathin and dressing task but demonstrates (A) with basic transfer this session    Mobility  Overal bed mobility: Needs Assistance Bed Mobility: Supine to Sit,Sit to Supine Rolling: Min guard Sidelying to sit: Mod assist Supine to sit: Min assist Sit to supine: Min assist General bed mobility comments: min assist for supine<>sit for trunk and LE management, assist for boost up in bed upon return to supine.    Transfers  Overall transfer level: Needs assistance Equipment used: Rolling walker (2 wheeled) Transfer via Lift Equipment: Stedy Transfers: Sit to/from Stand Sit to Stand: Mod assist Stand pivot transfers: Mod assist Squat pivot transfers: Max assist,+2 physical assistance,+2 safety/equipment  Lateral/Scoot Transfers: Max assist,+2 safety/equipment,+2 physical assistance General transfer comment: Mod assist for power up, steady, physically placing hands on RW. Verbal cuing for hand placement when rising/sitting, tremulous and ataxic once standing not improved with weighted input through pt shoulders. STS x3 from EOB.    Ambulation / Gait / Stairs / Wheelchair Mobility  Ambulation/Gait Ambulation/Gait assistance: Mod assist,+2 physical assistance Gait Distance (Feet): 4 Feet (4'x2) Assistive device: Rolling walker (2 wheeled) Gait Pattern/deviations: Step-to pattern,Decreased stride length,Ataxic General Gait Details: lateral stepping and forward stepping only; pregait activity requiring mod assist for weight shifting, physically translating LEs, and steadying Gait velocity: decreased    Posture / Balance Dynamic Sitting Balance Sitting balance - Comments: Pt able to sit at EOB with close supervision but did require min A at times due to posterior lean Balance Overall balance assessment: Needs assistance Sitting-balance support: No  upper extremity supported Sitting balance-Leahy Scale: Fair Sitting balance - Comments: Pt able to sit at EOB with close supervision but did require min A at times due to posterior lean Postural control: Posterior lean Standing balance support: Bilateral upper extremity supported Standing balance-Leahy Scale: Poor Standing balance comment: reiles on RW    Special needs/care consideration Diabetic management New diabetic, manged with sQ Novologand Levemir    and Special service needs Pt. with dx of schizophrenia, well managed for years with antipsychotics. Has Left wrist cock-up splint.   Previous Home Environment (from acute therapy documentation) Living Arrangements: Alone  Lives With: Alone Available Help at Discharge: Family Type of Home: Apartment Home Layout: One level Home Access: Stairs to enter Entrance Stairs-Rails: Right Entrance Stairs-Number of Steps: 16 Bathroom Shower/Tub: Tub/shower unit Bathroom Toilet: Standard Bathroom Accessibility: Yes How Accessible: Accessible via walker Home Care Services: No Additional Comments: per chart pt was living with brothers PTA, pt currently unable to provide home setup.  Discharge Living Setting Plans for Discharge Living Setting: House Type of Home at Discharge: House Discharge Home Layout: One level Discharge Home Access: Stairs to enter Entrance Stairs-Rails: None   Entrance Stairs-Number of Steps: 2 Discharge Bathroom Shower/Tub: Tub/shower unit Discharge Bathroom Toilet: Standard Discharge Bathroom Accessibility: No Does the patient have any problems obtaining your medications?: No  Social/Family/Support Systems Patient Roles: Parent Contact Information: [phone] Anticipated Caregiver: [name] Simmons (Mother) Anticipated Caregiver's Contact Information: [phone] Ability/Limitations of Caregiver: can provide min-mod A Caregiver Availability: 24/7 Discharge Plan Discussed with Primary Caregiver: Yes Is Caregiver  In Agreement with Plan?: Yes Does Caregiver/Family have Issues with Lodging/Transportation while Pt is in Rehab?: No  Goals Patient/Family Goal for Rehab: PT/OT/SLP Min A Expected length of stay: 14-18 days Pt/Family Agrees to Admission and willing to participate: Yes Program Orientation Provided & Reviewed with Pt/Caregiver Including Roles  & Responsibilities: Yes  Decrease burden of Care through IP rehab admission: Specialzed equipment needs, Diet advancement, Decrease number of caregivers, Bowel and bladder program and Patient/family education  Possible need for SNF placement upon discharge: not stated  Patient Condition: I have reviewed medical records from [facility] , spoken with CM, and patient and family member. I met with patient at the bedside for inpatient rehabilitation assessment.  Patient will benefit from ongoing PT, OT and SLP, can actively participate in 3 hours of therapy a day 5 days of the week, and can make measurable gains during the admission.  Patient will also benefit from the coordinated team approach during an Inpatient Acute Rehabilitation admission.  The patient will receive intensive therapy as well as Rehabilitation physician, nursing, social worker, and care management interventions.  Due to bladder management, safety, skin/wound care, disease management, medication administration, pain management and patient education the patient requires 24 hour a day rehabilitation nursing.  The patient is currently min A to  Mod A with mobility and basic ADLs.  Discharge setting and therapy post discharge at home with home health is anticipated.  Patient has agreed to participate in the Acute Inpatient Rehabilitation Program and will admit today.  Preadmission Screen Completed By:  Zahria Ding B Vanity Larsson, 03/15/2020 10:57 AM ______________________________________________________________________   Discussed status with Dr. Swartz  on 03/15/20 at 930 and received approval  for admission today.  Admission Coordinator:  Quashawn Jewkes B Anali Cabanilla, CCC-SLP, time 1100/Date 03/15/2020   Assessment/Plan: Diagnosis: debility and hypoxic encephalopathy related to DKA/PEA 1. Does the need for close, 24 hr/day Medical supervision in concert with the patient's rehab needs make it unreasonable for this patient to be served in a less intensive setting? Yes 2. Co-Morbidities requiring supervision/potential complications: acute pancreatitis, trach, pneumonia 3. Due to bladder management, bowel management, safety, skin/wound care, disease management, medication administration, pain management and patient education, does the patient require 24 hr/day rehab nursing? Yes 4. Does the patient require coordinated care of a physician, rehab nurse, PT, OT, and SLP to address physical and functional deficits in the context of the above medical diagnosis(es)? Yes Addressing deficits in the following areas: balance, endurance, locomotion, strength, transferring, bowel/bladder control, bathing, dressing, feeding, grooming, toileting, cognition and psychosocial support 5. Can the patient actively participate in an intensive therapy program of at least 3 hrs of therapy 5 days a week? Yes 6. The potential for patient to make measurable gains while on inpatient rehab is excellent 7. Anticipated functional outcomes upon discharge from inpatient rehab: min assist PT, min assist OT, min assist SLP 8. Estimated rehab length of stay to reach the above functional goals is: 14-18 days 9. Anticipated discharge destination: Home 10. Overall Rehab/Functional Prognosis: excellent   MD Signature: Zachary T. Swartz, MD, FAAPMR [place] [facility] 03/15/2020  

## 2020-03-15 NOTE — Progress Notes (Signed)
   Subjective:   No acute events overnight.  During rounds, patient states doing alright. Eating alright and PT is going good. He notes enjoying working with PT and that he feels stronger. He had no questions this morning.  Objective:  Vital signs in last 24 hours: Vitals:   03/14/20 1300 03/14/20 2103 03/15/20 0323 03/15/20 0500  BP: 112/80 121/77 111/80   Pulse: 86 92 88   Resp: 16 18 18    Temp: 98.1 F (36.7 C) 99 F (37.2 C) 98.8 F (37.1 C)   TempSrc: Oral Oral Oral   SpO2: 97% 94% 96%   Weight:    91.6 kg  Height:       Physical Exam: Constitutional: WDWN man lying in bed, in no acute distress  HENT: stoma clean/dry/intact  Cardiovascular: RRR, no m/r/g Pulmonary/Chest: normal work of breathing on room air Neurological: voice clear and strong, speech is clear  Assessment/Plan: Bryan [name] is a 45 year old male with a pertinent past medical history of schizophrenia and diabetes who presented to 56 on 01/25/2020 with acute metabolic encephalopathy secondary to DKA.  His clinical course has been complicated by acute pancreatitis resulting in significant hypotension requiring emergent intubation due to PEA arrest. Subsequent MSSA pneumonia which is all resolved and is now recovering, saturating well on room air since 03/01/20, now post self-decannulation and off of tube feeds.  This is hospital day 50.  Active Problems:   Schizoaffective disorder (HCC)   MDD (major depressive disorder), recurrent, severe, with psychosis (HCC)   DKA (diabetic ketoacidosis) (HCC)   Pancreatitis   Respiratory failure (HCC)   AKI (acute kidney injury) (HCC)   Dehydration   Toxic metabolic encephalopathy   Pressure injury of skin   Tracheostomy secondary to acute hypoxic respiratory failure S/p decannulation 03/03/20 Patient continues to breathe comfortably on room air, maintaining oxygen saturations >95%. Well healing stoma with no drainage or exudate.  Acute metabolic  encephalopathy S/p PEA cardiac arrest with anoxic brain injury Schizophrenia Cortrak removed 03/03/19. His appetite in increasing and is finishing 75-90% of his meals.  - Dysphagia 3 diet - Appreciate SLP recommendations - Seroquel 100 mg BID  New diagnosis of diabetes presenting with DKA No hypoglycemia overnight.  - Increase metformin to glucophage XR 500 mg Twice daily - Levemir 30U Daily - Novolog 5U TIDWC  Sinus tachycardia - Continue metoprolol 50 mg BID    Decubitus ulcer on coccyx, unstageable - Continue wound care - Continue offloading pressure - Continue using pressure injury pads  Diet: Dysphagia 3 IVF: none VTE: Lovenox CODE: Full code  Prior to Admission Living Arrangement: Home PT/OT recs: [facility] Anticipated Discharge Location: [facility]. Patient received Pfizer vaccine #1 on 03/06/20.   05/04/20, MD PGY-2 Internal Medicine Teaching Service Pager: [phone] 8:06 AM, 03/15/2020    After 5pm on weekdays and 1pm on weekends: On Call pager: [phone]

## 2020-03-15 NOTE — Progress Notes (Signed)
PT Cancellation Note  Patient Details Name: Brooklyn Jeff MRN: [id number] DOB: 04-Dec-1975   Cancelled Treatment:    Reason Eval/Treat Not Completed: Other (comment).  Pt had a large meal and now is declining therapy, trying to sleep.  Reattempt at another time.   Ivar Drape 03/15/2020, 2:09 PM   Samul Dada, PT MS Acute Rehab Dept. Number: [facility] [id number] and [facility] [phone]

## 2020-03-16 ENCOUNTER — Inpatient Hospital Stay (HOSPITAL_COMMUNITY): Payer: Medicaid Other

## 2020-03-16 ENCOUNTER — Inpatient Hospital Stay (HOSPITAL_COMMUNITY): Payer: Medicaid Other | Admitting: Speech Pathology

## 2020-03-16 ENCOUNTER — Inpatient Hospital Stay (HOSPITAL_COMMUNITY): Payer: Medicaid Other | Admitting: Occupational Therapy

## 2020-03-16 DIAGNOSIS — E782 Mixed hyperlipidemia: Secondary | ICD-10-CM

## 2020-03-16 DIAGNOSIS — E1165 Type 2 diabetes mellitus with hyperglycemia: Secondary | ICD-10-CM

## 2020-03-16 DIAGNOSIS — F209 Schizophrenia, unspecified: Secondary | ICD-10-CM

## 2020-03-16 LAB — HEMOGLOBIN A1C
Hgb A1c MFr Bld: 7.1 % — ABNORMAL HIGH (ref 4.8–5.6)
Mean Plasma Glucose: 157.07 mg/dL

## 2020-03-16 LAB — COMPREHENSIVE METABOLIC PANEL
ALT: 41 U/L (ref 0–44)
AST: 23 U/L (ref 15–41)
Albumin: 3.1 g/dL — ABNORMAL LOW (ref 3.5–5.0)
Alkaline Phosphatase: 137 U/L — ABNORMAL HIGH (ref 38–126)
Anion gap: 9 (ref 5–15)
BUN: 12 mg/dL (ref 6–20)
CO2: 26 mmol/L (ref 22–32)
Calcium: 10.4 mg/dL — ABNORMAL HIGH (ref 8.9–10.3)
Chloride: 101 mmol/L (ref 98–111)
Creatinine, Ser: 1.01 mg/dL (ref 0.61–1.24)
GFR, Estimated: >60 mL/min (ref >60)
Glucose, Bld: 109 mg/dL — ABNORMAL HIGH (ref 70–99)
Potassium: 4.1 mmol/L (ref 3.5–5.1)
Sodium: 136 mmol/L (ref 135–145)
Total Bilirubin: 0.8 mg/dL (ref 0.3–1.2)
Total Protein: 6.5 g/dL (ref 6.5–8.1)

## 2020-03-16 LAB — GLUCOSE, CAPILLARY
Glucose-Capillary: 113 mg/dL — ABNORMAL HIGH (ref 70–99)
Glucose-Capillary: 98 mg/dL (ref 70–99)
Glucose-Capillary: 97 mg/dL (ref 70–99)
Glucose-Capillary: 87 mg/dL (ref 70–99)

## 2020-03-16 LAB — CBC WITH DIFFERENTIAL/PLATELET
Abs Immature Granulocytes: 0.02 10*3/uL (ref 0.00–0.07)
Basophils Absolute: 0 10*3/uL (ref 0.0–0.1)
Basophils Relative: 0 %
Eosinophils Absolute: 0.1 10*3/uL (ref 0.0–0.5)
Eosinophils Relative: 2 %
HCT: 40.9 % (ref 39.0–52.0)
Hemoglobin: 13 g/dL (ref 13.0–17.0)
Immature Granulocytes: 0 %
Lymphocytes Relative: 38 %
Lymphs Abs: 2 10*3/uL (ref 0.7–4.0)
MCH: 29.9 pg (ref 26.0–34.0)
MCHC: 31.8 g/dL (ref 30.0–36.0)
MCV: 94 fL (ref 80.0–100.0)
Monocytes Absolute: 0.5 10*3/uL (ref 0.1–1.0)
Monocytes Relative: 10 %
Neutro Abs: 2.6 10*3/uL (ref 1.7–7.7)
Neutrophils Relative %: 50 %
Platelets: 385 10*3/uL (ref 150–400)
RBC: 4.35 MIL/uL (ref 4.22–5.81)
RDW: 14.8 % (ref 11.5–15.5)
WBC: 5.4 10*3/uL (ref 4.0–10.5)
nRBC: 0 % (ref 0.0–0.2)

## 2020-03-16 LAB — TRIGLYCERIDES: Triglycerides: 294 mg/dL — ABNORMAL HIGH (ref <150)

## 2020-03-16 NOTE — Progress Notes (Signed)
Inpatient Rehabilitation  Patient information reviewed and entered into eRehab system by Emberly Tomasso M. Mandeep Ferch, M.A., CCC/SLP, PPS Coordinator.  Information including medical coding, functional ability and quality indicators will be reviewed and updated through discharge.    

## 2020-03-16 NOTE — Evaluation (Signed)
Occupational Therapy Assessment and Plan  Patient Details  Name: Bryan [name] MRN: [id number] Date of Birth: [birth year]  OT Diagnosis: altered mental status, apraxia, cognitive deficits, disturbance of vision and muscle weakness (generalized) Rehab Potential: Rehab Potential (ACUTE ONLY): Good ELOS: 18-21 days   Today's Date: 03/16/2020 OT Individual Time: 9233-0076 OT Individual Time Calculation (min): 69 min     Hospital Problem: Principal Problem:   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[facility]) Active Problems:   Schizophrenia ([place])   Elevated triglycerides with high cholesterol   Controlled type 2 diabetes mellitus with hyperglycemia ([place])   Past Medical History:  Past Medical History:  Diagnosis Date  . Hypertension   . Psychiatric problem    Seen at [facility] for unknown reason. Takes seroquel. History of hearing voices.  Not commandivng voices.   . Schizophrenia ([place])   . Tremors of nervous system    Past Surgical History:  Past Surgical History:  Procedure Laterality Date  . none    . TRACHEOSTOMY TUBE PLACEMENT N/A 02/13/2020   Procedure: TRACHEOSTOMY;  Surgeon: Leta Baptist, MD;  Location: [facility] OR;  Service: ENT;  Laterality: N/A;    Assessment & Plan Clinical Impression: Patient is a 45 y.o. right handed male with history of HTN, schizophrenia who was admitted on 01/25/20 with hypotension, lethargy and found to have newly diagnosed DM with DKA with AKI. He was treated with IVF, IV insulin and started on broad spectrum antibiotics due toleucocytosis withconcerns of sepsis.On 11/30, patient found apneic and pulseless requiring two rounds of CPR with ACLS protocol with ROSC.He was intubated and work up done revealing moderate acute pancreatitis--likely due to elevated triglycerides-600. Abdominal ultrasound showed GB sludge without acute cholecystitis and fatty liver. 2D echo showed EF 70-75% with grade I DD. He was treated with supportive care and MRI brain done due to decrease in  responsiveness-->negative for acute changes. He required tracheostomy 12/18 by Dr. Benjamine Mola for VDRF. 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 He tolerated extubation and self decannulated on 01/06. Respiratory status has been stable and diet has been advanced to regular. Bouts of agitation has resolved and mentation improving but patient continued to be limited by cognitive deficits with diffuse weakness affecting overall functional status. Therapy working on pregait activity/standing attempts. Family has declined SNF and plans on providing care post discharge. CIR was recommended due to functional decline. Patient transferred to CIR on 03/15/2020 .    Patient currently requires total with basic self-care skills secondary to muscle weakness, decreased cardiorespiratoy endurance, impaired timing and sequencing, unbalanced muscle activation, decreased coordination and decreased motor planning, decreased visual perceptual skills, decreased attention to right, decreased attention, decreased awareness, decreased problem solving, decreased safety awareness, decreased memory and delayed processing and decreased sitting balance, decreased standing balance, decreased postural control and decreased balance strategies.  Prior to hospitalization, patient could complete ADLs with independent .  Patient will benefit from skilled intervention to decrease level of assist with basic self-care skills prior to discharge home with care partner.  Anticipate patient will require 24 hour supervision and minimal physical assistance and follow up home health.  OT - End of Session Activity Tolerance: Tolerates 30+ min activity with multiple rests Endurance Deficit: Yes Endurance Deficit Description: pt lethargic and reported feeling exhausted OT Assessment Rehab Potential (ACUTE ONLY): Good OT Barriers to Discharge:  Home environment access/layout;Incontinence OT Barriers to Discharge Comments: bathroom not handicap accessible OT Patient  demonstrates impairments in the following area(s): Balance;Cognition;Endurance;Motor;Pain;Perception;Safety;Vision OT Basic ADL's Functional Problem(s): Eating;Grooming;Bathing;Dressing;Toileting OT Transfers Functional Problem(s): Toilet;Tub/Shower OT Additional Impairment(s): Fuctional Use of Upper Extremity OT Plan OT Intensity: Minimum of 1-2 x/day, 45 to 90 minutes OT Frequency: 5 out of 7 days OT Duration/Estimated Length of Stay: 18-21 days OT Treatment/Interventions: Balance/vestibular training;Cognitive remediation/compensation;Community reintegration;Discharge planning;Disease mangement/prevention;DME/adaptive equipment instruction;Functional mobility training;Neuromuscular re-education;Pain management;Patient/family education;Psychosocial support;Self Care/advanced ADL retraining;Splinting/orthotics;Therapeutic Activities;Therapeutic Exercise;UE/LE Strength taining/ROM;UE/LE Coordination activities;Visual/perceptual remediation/compensation OT Self Feeding Anticipated Outcome(s): Supervision/setup OT Basic Self-Care Anticipated Outcome(s): Min assist OT Toileting Anticipated Outcome(s): Min assist OT Bathroom Transfers Anticipated Outcome(s): Min assist OT Recommendation Recommendations for Other Services: Neuropsych consult Patient destination: Home Follow Up Recommendations: Home health OT;24 hour supervision/assistance Equipment Recommended: 3 in 1 bedside comode;Tub/shower bench;To be determined   OT Evaluation Precautions/Restrictions  Precautions Precautions: Fall Required Braces or Orthoses: Other Brace Other Brace: L wrist cock up splint to be worn during day per RN - per [facility] care chart (did not have during CIR eval) General   Vital Signs Therapy Vitals Temp: 97.9 F (36.6 C) Temp Source: Oral Pulse Rate: 92 Resp: 18 BP: 110/70 Patient  Position (if appropriate): Lying Oxygen Therapy SpO2: 94 % O2 Device: Room Air Pain Pain Assessment Pain Scale: 0-10 Pain Score: 4  Pain Location: Leg Pain Orientation: Right Pain Descriptors / Indicators: Grimacing Pain Intervention(s): Repositioned Home Living/Prior Functioning Home Living Living Arrangements: Alone Available Help at Discharge: Family Type of Home: House Home Access: Stairs to enter Technical brewer of Steps: 1 Entrance Stairs-Rails: None Home Layout: Two level Alternate Level Stairs-Number of Steps: 14-16 Bathroom Shower/Tub: Chiropodist: Standard Bathroom Accessibility: No Additional Comments: pt reports living with mother and two brothers, who can all assist at discharge. [name] arrived during PT session and answered questions regarding number of stairs  Lives With: Family Prior Function Level of Independence: Independent with basic ADLs,Independent with gait,Independent with transfers,Independent with homemaking with ambulation  Able to Take Stairs?: Yes Driving: No Vocation: On disability Comments: pt reported needing assist for transportation. 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  However will simple visual scanning pt able to scan and locate items in both visual fields with increased time Perception  Perception: Impaired Praxis Praxis Impairment Details: Ideomotor;Motor planning;Initiation Cognition Overall Cognitive Status: Impaired/Different from baseline Arousal/Alertness: Lethargic Orientation Level: Person;Place Year: 2019 (after given cue for 1900 or 2000) Month: December Day of Week: Incorrect (Thursday) Memory: Impaired Memory Impairment: Retrieval deficit Immediate Memory Recall: Sock;Blue;Bed Memory  Recall Sock: Without Cue Memory Recall Blue: With Cue Memory Recall Bed: Not able to recall Attention: Sustained Sustained Attention: Appears intact Awareness: Impaired Problem Solving: Impaired Safety/Judgment: Impaired Sensation Sensation Light Touch: Appears Intact Proprioception: Impaired by gross assessment Additional Comments: mild ataxia in LLE Coordination Gross Motor Movements are Fluid and Coordinated: No Fine Motor Movements are Fluid and Coordinated: No Coordination and Movement Description: grossly uncoordinated UE and LE due to poor motor planning/sequencing, generalized weakness, decreased balance/postural control, and poor activity tolerance Finger Nose Finger Test: dysmetria bilaterally LUE>RUE Heel Shin Test: decreased ROM bilaterally Motor  Motor Motor: Ataxia;Abnormal postural alignment and control Motor - Skilled Clinical Observations: grossly uncoordinated UE and LE due to poor motor planning/sequencing, generalized weakness, decreased balance/postural control, and poor activity tolerance  Trunk/Postural Assessment  Cervical Assessment Cervical Assessment: Exceptions to [facility] (forward head)  Thoracic Assessment Thoracic Assessment: Exceptions to [facility] (mild kyphosis) Lumbar Assessment Lumbar Assessment: Exceptions to [facility] (posterior pelvic tilt) Postural Control Postural Control: Deficits on evaluation Trunk Control: posterior lean sitting EOB Righting Reactions: delayed  Balance Balance Balance Assessed: Yes Static Sitting Balance Static Sitting - Balance Support: Feet supported;Right upper extremity supported Static Sitting - Level of Assistance: 5: Stand by assistance (CGA) Dynamic Sitting Balance Dynamic Sitting - Balance Support: Feet supported;Right upper extremity supported Dynamic Sitting - Level of Assistance: 4: Min assist Extremity/Trunk Assessment RUE Assessment RUE Assessment: Exceptions to [facility] Passive Range of Motion (PROM) Comments:  shoulder limited to ~100*, elbow and wrist WNL Active Range of Motion (AROM) Comments: shoulder limited to ~70*, elbow and wrist WNL General Strength Comments: strength 2/5, unable to sustain hold against gravity.  Pt with loose gross grasp LUE Assessment LUE Assessment: Exceptions to [facility] Passive Range of Motion (PROM) Comments: WNL Active Range of Motion (AROM) Comments: shoulder flexion ~100*, eblow and wrist WNF General Strength Comments: grossly 2-3/5 overall, loose gross grasp  Care Tool Care Tool Self Care Eating        Oral Care  Oral care, brush teeth, clean dentures activity did not occur: Refused      Bathing   Body parts bathed by patient: Chest;Face Body parts bathed by helper: Right arm;Left arm;Abdomen;Front perineal area;Buttocks;Right upper leg;Left upper leg;Right lower leg;Left lower leg;Face   Assist Level: Total Assistance - Patient < 25%    Upper Body Dressing(including orthotics)   What is the patient wearing?: Pull over shirt   Assist Level: Total Assistance - Patient < 25%    Lower Body Dressing (excluding footwear)   What is the patient wearing?: Pants;Incontinence brief Assist for lower body dressing: Total Assistance - Patient < 25%    Putting on/Taking off footwear   What is the patient wearing?: Non-skid slipper socks Assist for footwear: Dependent - Patient 0%       Care Tool Toileting Toileting activity   Assist for toileting: Dependent - Patient 0%     Care Tool Bed Mobility Roll left and right activity   Roll left and right assist level: Minimal Assistance - Patient > 75%    Sit to lying activity   Sit to lying assist level: Moderate Assistance - Patient 50 - 74%    Lying to sitting edge of bed activity   Lying to sitting edge of bed assist level: Moderate Assistance - Patient 50 - 74%     Care Tool Transfers Sit to stand transfer Sit to stand activity did not occur: Safety/medical concerns (unable to stand without use of lift)       Chair/bed transfer         Toilet transfer Toilet transfer activity did not occur: Safety/medical concerns       Care Tool Cognition Expression of Ideas and Wants Expression of Ideas and Wants: Some difficulty - exhibits some difficulty with expressing needs and ideas (e.g, some words or finishing thoughts) or speech is not clear   Understanding Verbal and Non-Verbal Content Understanding Verbal and Non-Verbal Content: Usually understands - understands most conversations, but misses some part/intent of message. Requires cues at times to understand   Memory/Recall Ability *first 3 days only Memory/Recall Ability *first 3 days only: That he or she is in a hospital/hospital unit    Refer to Care Plan for [place] 1 OT Short Term Goal 1 (Week 1): Pt will complete bathing with mod  assist OT Short Term Goal 2 (Week 1): Pt will complete UB dressing with mod assist OT Short Term Goal 3 (Week 1): Pt will complete LB dressing with max assist of one caregiver at sit > stand level OT Short Term Goal 4 (Week 1): Pt will complete toilet transfer with max assist of one caregiver  Recommendations for other services: Neuropsych   Skilled Therapeutic Intervention OT eval completed with discussion of rehab process, OT purpose, POC, ELOS, and goals.  ADL assessment completed with focus on bathing and dressing and functional mobility.  Pt received supine in bed with nurse tech arriving to assist with breakfast.  Pt positioned more upright to engage in self-feeding, however pt unable to lift either UE enough to scoop food from tray.  Therapist adjusted tray to increase success, however pt still unable to lift hands to tray.  Once food scooped on to utensil, pt able to feed self ~50% of meal requiring increased time and demonstrating decreased UE strength and coordination.  Engaged in rolling to come to sitting at EOB.  Pt required mod assist to come to sitting EOB and then  required CGA for sitting balance due to weakness and instability in unsupported sitting.  Therapist directed pt in bathing while seated EOB.  Pt able to wash majority of face, however due to limited ROM and weakness requiring assistance for thoroughness.  Pt attempted to was chest but due to incoordination and weakness required assistance for remainder of UB.  Attempted sit > stand, however despite max-total assist unable to advance > 2-3 inches from EOB without additional assistance or mechanical lift.  Returned to supine with mod assist.  Engaged in LB bathing and dressing at bed level with rolling R and L with min assist.  Pt reports mild pain in RLE with rolling.  Therapist assisted with pulling pants over hips while pt bridged from bed with good strength to clear buttocks but demonstrated decreased core strength.  Repositioned upright in bed total assist with use of bed features and pt boosting self with BLE.  ADL ADL Eating: Moderate assistance Where Assessed-Eating: Bed level Grooming: Moderate assistance Where Assessed-Grooming: Edge of bed Upper Body Bathing: Maximal assistance Where Assessed-Upper Body Bathing: Edge of bed Lower Body Bathing: Dependent Where Assessed-Lower Body Bathing: Bed level Where Assessed-Upper Body Dressing: Edge of bed Lower Body Dressing: Dependent Where Assessed-Lower Body Dressing: Bed level Toileting: Dependent Toilet Transfer: Not assessed Tub/Shower Transfer: Not assessed Mobility  Bed Mobility Bed Mobility: Rolling Right;Rolling Left;Sit to Supine;Supine to Sit Rolling Right: Contact Guard/Touching assist Rolling Left: Minimal Assistance - Patient > 75% Supine to Sit: Moderate Assistance - Patient 50-74% Sit to Supine: Moderate Assistance - Patient 50-74% Transfers Sit to Stand: Other/comment;Total Assistance - Patient < 25%   Discharge Criteria: Patient will be discharged from OT if patient refuses treatment 3 consecutive times without medical  reason, if treatment goals not met, if there is a change in medical status, if patient makes no progress towards goals or if patient is discharged from hospital.  The above assessment, treatment plan, treatment alternatives and goals were discussed and mutually agreed upon: by patient  Simonne Come 03/16/2020, 8:50 PM

## 2020-03-16 NOTE — IPOC Note (Signed)
Individualized overall Plan of Care [facility]) Patient Details Name: Bryan [name] MRN: [id number] DOB: 04-23-1975  Admitting Diagnosis: Anoxic brain injury [facility])  Hospital Problems: Principal Problem:   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H[facility]) Active Problems:   Schizophrenia (H[facility])   Elevated triglycerides with high cholesterol   Controlled type 2 diabetes mellitus with hyperglycemia ([facility])     Functional Problem List: Nursing Bladder,Medication Management,Bowel,Safety,Nutrition,Endurance,Skin Integrity,Edema  PT Balance,Endurance,Motor,Perception,Safety,Skin Integrity  OT Balance,Cognition,Endurance,Motor,Pain,Perception,Safety,Vision  SLP Cognition,Linguistic,Perception,Safety  TR         Basic ADL's: OT Eating,Grooming,Bathing,Dressing,Toileting     Advanced  ADL's: OT       Transfers: PT Bed Mobility,Bed to Chair,Car,Furniture  OT Toilet,Tub/Shower     Locomotion: PT Ambulation,Wheelchair Mobility,Stairs     Additional Impairments: OT Fuctional Use of Upper Extremity  SLP Social Cognition   Social Interaction,Problem Solving,Memory,Attention,Awareness  TR      Anticipated Outcomes Item Anticipated Outcome  Self Feeding Supervision/setup  Swallowing  mod I   Basic self-care  Min assist  Toileting  Min assist   Bathroom Transfers Min assist  Bowel/Bladder  manage bowel and bladder with mod I assist  Transfers  CGA with LRAD  Locomotion  min A with LRAD  Communication  supervision immediate needs, minA basic communication wants/needs/thoughts  Cognition  supervision basic safety, minA basic problem solving  Pain  PAin at or below level 4  Safety/Judgment  maintain safety wtih supervision/cues   Therapy Plan: PT Intensity: Minimum of 1-2 x/day ,45 to 90 minutes PT Frequency: 5 out of 7 days PT Duration Estimated Length of Stay: 18-21 days OT Intensity: Minimum of 1-2 x/day, 45 to 90 minutes OT Frequency: 5 out of 7 days OT Duration/Estimated Length of Stay:  18-21 days SLP Intensity: Minumum of 1-2 x/day, 30 to 90 minutes SLP Frequency: 3 to 5 out of 7 days SLP Duration/Estimated Length of Stay: 2-3 weeks    Team Interventions: Nursing Interventions Patient/Family Education,Bladder Management,Bowel Management,Disease Management/Prevention,Medication Management,Pain Management,Skin Care/Wound Management,Discharge Planning,Psychosocial Support  PT interventions Ambulation/gait training,Discharge planning,Functional mobility training,Psychosocial support,Therapeutic Activities,Visual/perceptual remediation/compensation,Balance/vestibular training,Disease management/prevention,Neuromuscular re-education,Skin care/wound Environmental consultant propulsion/positioning,Cognitive remediation/compensation,DME/adaptive equipment instruction,Pain management,Splinting/orthotics,UE/LE Strength taining/ROM,Community reintegration,Functional electrical stimulation,Patient/family education,Stair training,UE/LE Coordination activities  OT Interventions Balance/vestibular training,Cognitive remediation/compensation,Community reintegration,Discharge planning,Disease Psychologist, occupational instruction,Functional mobility training,Neuromuscular re-education,Pain management,Patient/family education,Psychosocial support,Self Care/advanced ADL retraining,Splinting/orthotics,Therapeutic Activities,Therapeutic Exercise,UE/LE Strength taining/ROM,UE/LE Coordination activities,Visual/perceptual remediation/compensation  SLP Interventions Cognitive remediation/compensation,Environmental controls,Functional tasks,Internal/external aids,Patient/family education,Speech/Language facilitation,Cueing hierarchy  TR Interventions    SW/CM Interventions Discharge Planning,Psychosocial Support,Patient/Family Education   Barriers to Discharge MD  Medical stability, New diabetic and Behavior  Nursing      PT Inaccessible home environment,Home environment  access/layout,Wound Care 14-16 steps to get to bedroom, 1 STE no rails, cognitive deficits  OT Home environment access/layout,Incontinence bathroom not handicap accessible  SLP Decreased caregiver support Patient reports he lives at home with Mom and two brothers  SW Medication compliance,Other (comments) Medicaid and psych history   Team Discharge Planning: Destination: PT-Home ,OT- Home , SLP-Home Projected Follow-up: PT-Home health PT, OT-  Home health OT,24 hour supervision/assistance, SLP-Home Health SLP,24 hour supervision/assistance Projected Equipment Needs: PT-To be determined, OT- 3 in 1 bedside comode,Tub/shower bench,To be determined, SLP-None recommended by SLP Equipment Details: PT- , OT-  Patient/family involved in discharge planning: PT- Patient,Family member/caregiver,  OT-Patient, SLP-Patient  MD ELOS: 17-20 days.. Medical Rehab Prognosis:  Good Assessment: Bryan [name] is a 45 year old male with history of HTN, schizophrenia who was admitted on 01/25/20 with hypotension, lethargy and found to have newly diagnosed DM with DKA  with AKI. He was treated with IVF, IV insulin and started on broad spectrum antibiotics due to leucocytosis with concerns of sepsis. On 11/30, patient found apneic and pulseless requiring two rounds of CPR with ACLS protocol with ROSC.   He was intubated and work up done revealing moderate acute pancreatitis--likely due to elevated triglycerides-600. Abdominal ultrasound showed GB sludge without acute cholecystitis and fatty liver.  2D echo showed EF 70-75% with grade I DD. He was treated with supportive care and MRI brain done due to decrease in responsiveness-->negative for acute changes. He required tracheostomy 12/18  by Dr. Suszanne Conners for VDRF. 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 He tolerated extubation and self decannulated on 01/06.  Respiratory status has been stable and diet has been advanced to regular. Bouts of agitation has resolved and mentation improving but patient continued to be limited by cognitive deficits with diffuse weakness affecting overall functional status. Therapy working on gait activity/standing attempts. Family has declined SNF and plans on providing care post discharge.  Patient with resulting functional deficits with mobility, cognition, self-care.  We will set goals for Min A with PT/OT/SLP.  Due to the current state of emergency, patients may not be receiving their 3-hours of Medicare-mandated therapy.  See Team Conference Notes for weekly updates to the plan of care

## 2020-03-16 NOTE — Progress Notes (Signed)
Inpatient Rehabilitation Center Individual Statement of Services  Patient Name:  Bryan [name]  Date:  03/16/2020  Welcome to the Inpatient Rehabilitation Center.  Our goal is to provide you with an individualized program based on your diagnosis and situation, designed to meet your specific needs.  With this comprehensive rehabilitation program, you will be expected to participate in at least 3 hours of rehabilitation therapies Monday-Friday, with modified therapy programming on the weekends.  Your rehabilitation program will include the following services:  Physical Therapy (PT), Occupational Therapy (OT), Speech Therapy (ST), 24 hour per day rehabilitation nursing, Neuropsychology, Care Coordinator, Rehabilitation Medicine, Nutrition Services and Pharmacy Services  Weekly team conferences will be held on Wednesday to discuss your progress.  Your Inpatient Rehabilitation Care Coordinator will talk with you frequently to get your input and to update you on team discussions.  Team conferences with you and your family in attendance may also be held.  Expected length of stay: 18-21 days  Overall anticipated outcome: min-CGA level  Depending on your progress and recovery, your program may change. Your Inpatient Rehabilitation Care Coordinator will coordinate services and will keep you informed of any changes. Your Inpatient Rehabilitation Care Coordinator's name and contact numbers are listed  below.  The following services may also be recommended but are not provided by the Inpatient Rehabilitation Center:    Home Health Rehabiltiation Services  Outpatient Rehabilitation Services    Arrangements will be made to provide these services after discharge if needed.  Arrangements include referral to agencies that provide these services.  Your insurance has been verified to be:  medicaid Your primary doctor is:  None  Pertinent information will be shared with your doctor and your insurance  company.  Inpatient Rehabilitation Care Coordinator:  Dossie Der, [place] [phone] or Luna Glasgow  Information discussed with and copy given to patient by: Lucy Chris, 03/16/2020, 1:59 PM

## 2020-03-16 NOTE — Evaluation (Signed)
Physical Therapy Assessment and Plan  Patient Details  Name: Bryan [name] MRN: [id number] Date of Birth: May 14, 1975  PT Diagnosis: Abnormal posture, Ataxia, Cognitive deficits, Coordination disorder, Impaired cognition and Muscle weakness Rehab Potential: Good ELOS: 18-21 days   Today's Date: 03/16/2020 PT Individual Time: 6962-9528 PT Individual Time Calculation (min): 55 min    Hospital Problem: Principal Problem:   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[place]) Active Problems:   Schizophrenia ([place])   Elevated triglycerides with high cholesterol   Controlled type 2 diabetes mellitus with hyperglycemia ([place])   Past Medical History:  Past Medical History:  Diagnosis Date  . Hypertension   . Psychiatric problem    Seen at [facility] for unknown reason. Takes seroquel. History of hearing voices.  Not commandivng voices.   . Schizophrenia ([place])   . Tremors of nervous system    Past Surgical History:  Past Surgical History:  Procedure Laterality Date  . none    . TRACHEOSTOMY TUBE PLACEMENT N/A 02/13/2020   Procedure: TRACHEOSTOMY;  Surgeon: Leta Baptist, MD;  Location: [facility] OR;  Service: ENT;  Laterality: N/A;    Assessment & Plan Clinical Impression: Patient is a 45 y.o. year old male with history of schizophrenia who presented to [facility] ED on 01/25/20 with acute encephalopathy and lethargy and was admitted for DKA. He was transferred to the ICU on 11/30 with agitation and hypotension, had subsequent PEA arrest. Found to have acute pancreatitis with unclear etiology though found to have elevated triglycerides. Encephalopathy felt to be secondary to anoxia. Pt is s/p trach on 12/18 due to ventilator dependence, decannulated 03/03/20. Subsequently spiked another fever on 12/19 with CXR findings suspicious for multifocal pneumonia.  Patient currently requires mod with mobility secondary to muscle weakness, decreased cardiorespiratoy endurance, impaired timing and sequencing, abnormal tone, unbalanced muscle  activation, ataxia, decreased coordination and decreased motor planning, decreased awareness, decreased problem solving, decreased safety awareness and decreased memory and decreased sitting balance, decreased standing balance, decreased postural control and decreased balance strategies.  Prior to hospitalization, patient was independent  with mobility and lived with Family in a House home.  Home access is 1Stairs to enter.  Patient will benefit from skilled PT intervention to maximize safe functional mobility, minimize fall risk and decrease caregiver burden for planned discharge home with 24 hour assist.  Anticipate patient will benefit from follow up [facility] at discharge.  PT - End of Session Activity Tolerance: Tolerates 30+ min activity with multiple rests Endurance Deficit: Yes Endurance Deficit Description: pt lethargic and reported feeling exhausted PT Assessment Rehab Potential (ACUTE/IP ONLY): Good PT Barriers to Discharge: [place] home environment;Home environment access/layout;Wound Care PT Barriers to Discharge Comments: 14-16 steps to get to bedroom, 1 STE no rails, cognitive deficits PT Patient demonstrates impairments in the following area(s): Balance;Endurance;Motor;Perception;Safety;Skin Integrity PT Transfers Functional Problem(s): Bed Mobility;Bed to Chair;Car;Furniture PT Locomotion Functional Problem(s): Ambulation;Wheelchair Mobility;Stairs PT Plan PT Intensity: Minimum of 1-2 x/day ,45 to 90 minutes PT Frequency: 5 out of 7 days PT Duration Estimated Length of Stay: 18-21 days PT Treatment/Interventions: Ambulation/gait training;Discharge planning;Functional mobility training;Psychosocial support;Therapeutic Activities;Visual/perceptual remediation/compensation;Balance/vestibular training;Disease management/prevention;Neuromuscular re-education;Skin care/wound management;Therapeutic Exercise;Wheelchair propulsion/positioning;Cognitive remediation/compensation;DME/adaptive  equipment instruction;Pain management;Splinting/orthotics;UE/LE Strength taining/ROM;Community reintegration;Functional electrical stimulation;Patient/family education;Stair training;UE/LE Coordination activities PT Transfers Anticipated Outcome(s): CGA with LRAD PT Locomotion Anticipated Outcome(s): min A with LRAD PT Recommendation Follow Up Recommendations: Home health PT Patient destination: Home Equipment Recommended: To be determined  PT Evaluation Precautions/Restrictions Precautions Precautions: Fall Required Braces or Orthoses: Other Brace Other Brace: L wrist cock up splint  to be worn during day per RN - per [place] care chart (did not have during CIR eval) Restrictions Weight Bearing Restrictions: No Home Living/Prior Functioning Home Living Living Arrangements: Alone Available Help at Discharge: Family Type of Home: House Home Access: Stairs to enter Technical brewer of Steps: 1 Entrance Stairs-Rails: None Home Layout: Two level Alternate Level Stairs-Number of Steps: 14-16 Bathroom Shower/Tub: Chiropodist: Standard Bathroom Accessibility: No Additional Comments: pt reports living with mother and two brothers, who can all assist at discharge. [name] arrived during session and answered questions regarding number of stairs  Lives With: Family Prior Function Level of Independence: Independent with basic ADLs;Independent with gait;Independent with transfers;Independent with homemaking with ambulation  Able to Take Stairs?: Yes Driving: No Vocation: On disability Comments: pt reported needing assist for transportation. Cognition Overall Cognitive Status: Impaired/Different from baseline Arousal/Alertness: Lethargic Orientation Level: Oriented to person;Oriented to place Memory: Impaired Awareness: Impaired Problem Solving: Impaired Safety/Judgment: Impaired Sensation Sensation Light Touch: Appears Intact Proprioception: Impaired by  gross assessment Additional Comments: mild ataxia in LLE Coordination Gross Motor Movements are Fluid and Coordinated: No Fine Motor Movements are Fluid and Coordinated: No Coordination and Movement Description: grossly uncoordinated UE and LE due to poor motor planning/sequencing, generalized weakness, decreased balance/postural control, and poor activity tolerance Finger Nose Finger Test: dysmetria bilaterally LUE>RUE Heel Shin Test: decreased ROM bilaterally Motor  Motor Motor: Ataxia;Abnormal postural alignment and control Motor - Skilled Clinical Observations: grossly uncoordinated UE and LE due to poor motor planning/sequencing, generalized weakness, decreased balance/postural control, and poor activity tolerance  Trunk/Postural Assessment  Cervical Assessment Cervical Assessment: Exceptions to [facility] (forward head) Thoracic Assessment Thoracic Assessment: Exceptions to [facility] (mild kyphosis) Lumbar Assessment Lumbar Assessment: Exceptions to [facility] (posterior pelvic tilt) Postural Control Postural Control: Deficits on evaluation Trunk Control: posterior lean sitting EOB Righting Reactions: delayed  Balance Balance Balance Assessed: Yes Static Sitting Balance Static Sitting - Balance Support: Feet supported;Right upper extremity supported Static Sitting - Level of Assistance: 5: Stand by assistance (CGA) Dynamic Sitting Balance Dynamic Sitting - Balance Support: Feet supported;Right upper extremity supported Dynamic Sitting - Level of Assistance: 4: Min assist Extremity Assessment  RLE Assessment RLE Assessment: Exceptions to [facility] General Strength Comments: grossly generalized to 4-/5 LLE Assessment LLE Assessment: Exceptions to [facility] General Strength Comments: grossly generalized to 4-/5  Care Tool Care Tool Bed Mobility Roll left and right activity   Roll left and right assist level: Contact Guard/Touching assist    Sit to lying activity   Sit to lying assist level: Moderate  Assistance - Patient 50 - 74%    Lying to sitting edge of bed activity   Lying to sitting edge of bed assist level: Moderate Assistance - Patient 50 - 74%     Care Tool Transfers Sit to stand transfer   Sit to stand assist level: Moderate Assistance - Patient 50 - 74% (Stedy)    Chair/bed transfer   Chair/bed transfer assist level: Dependent - Librarian, academic transfer activity did not occur: Safety/medical concerns (fatigue, poor motor control/planning, weakness)        Care Tool Locomotion Ambulation Ambulation activity did not occur: Safety/medical concerns (fatigue, poor motor control/planning, weakness)        Walk 10 feet activity Walk 10 feet activity did not occur: Safety/medical concerns (fatigue, poor motor control/planning, weakness)       Walk 50 feet with 2  turns activity Walk 50 feet with 2 turns activity did not occur: Safety/medical concerns (fatigue, poor motor control/planning, weakness)      Walk 150 feet activity Walk 150 feet activity did not occur: Safety/medical concerns (fatigue, poor motor control/planning, weakness)      Walk 10 feet on uneven surfaces activity Walk 10 feet on uneven surfaces activity did not occur: Safety/medical concerns (fatigue, poor motor control/planning, weakness)      Stairs Stair activity did not occur: Safety/medical concerns (fatigue, poor motor control/planning, weakness)        Walk up/down 1 step activity Walk up/down 1 step or curb (drop down) activity did not occur: Safety/medical concerns (fatigue, poor motor control/planning, weakness)     Walk up/down 4 steps activity did not occuR: Safety/medical concerns (fatigue, poor motor control/planning, weakness)  Walk up/down 4 steps activity      Walk up/down 12 steps activity Walk up/down 12 steps activity did not occur: Safety/medical concerns (fatigue, poor motor control/planning, weakness)      Pick up small objects  from floor Pick up small object from the floor (from standing position) activity did not occur: Safety/medical concerns (fatigue, poor motor control/planning, weakness)      Wheelchair Will patient use wheelchair at discharge?: Yes Type of Wheelchair: Manual   Wheelchair assist level: Moderate Assistance - Patient 50 - 74% Max wheelchair distance: 63f  Wheel 50 feet with 2 turns activity   Assist Level: Maximal Assistance - Patient 25 - 49%  Wheel 150 feet activity   Assist Level: Total Assistance - Patient < 25%    Refer to Care Plan for Long Term Goals  SHORT TERM GOAL WEEK 1 PT Short Term Goal 1 (Week 1): pt will perform all bed mobility with min A overall PT Short Term Goal 2 (Week 1): pt will transfer sit<>stand with LRAD and min A PT Short Term Goal 3 (Week 1): Pt will initiate gait training with LRAD  Recommendations for other services: None   Skilled Therapeutic Intervention Evaluation completed (see details above and below) with education on PT POC and goals and individual treatment initiated with focus on functional mobility/transfers, generalized strengthening, dynamic standing balance/coordination, and improved activity tolerance. Received pt supine in bed asleep, pt easily aroused and educated on PT evaluation, CIR policies, and therapy schedule and agreeable. Pt denied any pain during session. Therapist located [facility] purposes. Pt rolled L and R with CGA and use of bedrails and transferred supine<>sitting EOB with mod A. Pt required min A overall for dynamic sitting balance and pt with mild posterior lean. Pt reported feeling "exhausted" and pt's mother present briefly during session to drop off shoes and answered questions regarding number of stairs at home. Pt transferred sit<>stand in SMarlborowith mod A of 1 and mod/max cues for technique as pt with poor motor control/planning. Noted pt with LLE ataxia upon standing. Pt transferred bed<>WC  dependently via Stedy and performed WC mobility 430fusing BUE and mod A to steer in straight line. Pt required max cues for propulsion technique but with increased difficulty understanding and sequencing movement. Pt [facility] in [facility] and requested to return to bed. Pt transferred sit<>stand in StKaibab Estates Westith mod A and transferred back to bed dependently. Pt transferred sit<>supine with mod A for trunk control and LE management. Concluded session with pt supine in bed, needs within reach, and bed alarm on. Safety plan updated.   Mobility Bed Mobility Bed  Mobility: Rolling Right;Rolling Left;Sit to Supine;Supine to Sit Rolling Right: Contact Guard/Touching assist Rolling Left: Contact Guard/Touching assist Supine to Sit: Moderate Assistance - Patient 50-74% Sit to Supine: Moderate Assistance - Patient 50-74% Transfers Transfers: Sit to Stand;Stand Pivot Transfers Sit to Stand: Moderate Assistance - Patient 50-74% Stand Pivot Transfers: Dependent - mechanical lift Transfer via Lift Equipment: Probation officer Ambulation: No Gait Gait: No Stairs / Additional Locomotion Stairs: No Architect: Yes Wheelchair Assistance: Moderate Assistance - Patient 50 - 74% Wheelchair Propulsion: Both upper extremities Wheelchair Parts Management: Needs assistance Distance: 85f  Discharge Criteria: Patient will be discharged from PT if patient refuses treatment 3 consecutive times without medical reason, if treatment goals not met, if there is a change in medical status, if patient makes no progress towards goals or if patient is discharged from hospital.  The above assessment, treatment plan, treatment alternatives and goals were discussed and mutually agreed upon: by patient  AAlfonse AlpersPT, DPT  03/16/2020, 3:35 PM

## 2020-03-16 NOTE — Progress Notes (Signed)
Inpatient Rehabilitation Care Coordinator Assessment and Plan Patient Details  Name: Bryan [name] MRN: [id number] Date of Birth: Feb 13, 1976  Today's Date: 03/16/2020  Hospital Problems: Principal Problem:   Anoxic brain injury [facility]) Active Problems:   Schizophrenia (HCC)   Elevated triglycerides with high cholesterol   Controlled type 2 diabetes mellitus with hyperglycemia [place])  Past Medical History:  Past Medical History:  Diagnosis Date  . Hypertension   . Psychiatric problem    Seen at [facility] for unknown reason. Takes seroquel. History of hearing voices.  Not commandivng voices.   . Schizophrenia (HCC)   . Tremors of nervous system    Past Surgical History:  Past Surgical History:  Procedure Laterality Date  . none    . TRACHEOSTOMY TUBE PLACEMENT N/A 02/13/2020   Procedure: TRACHEOSTOMY;  Surgeon: Newman Pies, MD;  Location: [facility] OR;  Service: ENT;  Laterality: N/A;   Social History:  reports that he has been smoking cigarettes. He has been smoking about 0.50 packs per day. He has never used smokeless tobacco. He reports current alcohol use. He reports current drug use. Drugs: Cocaine and Marijuana.  Family / Support Systems Marital Status: Single Patient Roles: Other (Comment) (sibling and son) Other Supports: Pershing Cox [phone]  Salvatore Marvel [phone] Anticipated Caregiver: [name] and his brothers Ability/Limitations of Caregiver: [name] works 3-11p and brothers are unemployed Engineer, structural Availability: 24/7 Family Dynamics: Mom reports pt would go between her home and his apartment. He is close with his brothers and family and had declined in the past three weeks prior to admission due to not feeling well  Social History Preferred language: English Religion: Pentecostal Cultural Background: No issues Education: 8th grade Read: Yes Write: Yes Employment Status: Disabled Marine scientist Issues: No issues Guardian/Conservator:  None-according to MD pt is nt fully capable of making his own decisions at this time. Will look toward his Mom since he has no children and no formal POA/HCPOA in place   Abuse/Neglect Abuse/Neglect Assessment Can Be Completed: Yes Physical Abuse: Denies Verbal Abuse: Denies Sexual Abuse: Denies Exploitation of patient/patient's resources: Denies Self-Neglect: Denies  Emotional Status Pt's affect, behavior and adjustment status: Pt is not able to complete assessment oriented to self and place. Mom reports he was taking care of himself and cooking, but three weeks prior to admission he was not feeling well and slept a lot, until brought into the hospital. He does follow [facility] for his schizophrenia meds Recent Psychosocial Issues: other health issues-disabled due to his schizophrenia Psychiatric History: History of schizophrenia and takes medications for. Mom takes to [facility] once a month for meds adjustment. Has been a pt at [facility] in 2020. Will ask neuro-psych to see while here Substance Abuse History: history of THC, cocaine and tobacco was not positive for this upon admission.  Patient / Family Perceptions, Expectations & Goals Pt/Family understanding of illness & functional limitations: Mom can explain his medical issues, she does talk with MD and is a CNA herself. She understands most of what is going on, but is hopeful he will do well here and recover and be mobile again Premorbid pt/family roles/activities: son, brother, grandson, friend, etc Anticipated changes in roles/activities/participation: resume Pt/family expectations/goals: Mom states: " I hope he can do well here but we will help him."  " I will be coming to see him"  [employer]: Other (Comment) (Follwed at [facility] for MH issues) Premorbid Home Care/DME Agencies: None Transportation available at discharge: Didn't drive MOm transported to appointments Resource  referrals recommended:  Neuropsychology  Discharge Planning Living Arrangements: Alone Support Systems: Parent,Other relatives,Friends/neighbors Type of Residence: Private residence Insurance Resources: Medicaid (specify county) Surveyor, quantity Resources: SSI Financial Screen Referred: No Living Expenses: Psychologist, sport and exercise Management: Family Does the patient have any problems obtaining your medications?: No (didnt go to a PCP doesn;t have one) Home Management: Self and Mom Patient/Family Preliminary Plans: Plan to go between his apartment which has 14 steps and his Mom's home which has 2 steps. His brothers are at his Mom's home and are not working. Mom works secnd shift 3-11p at [employer]. Care Coordinator Barriers to Discharge: Medication compliance,Other (comments) Care Coordinator Barriers to Discharge Comments: Medicaid and psych history Care Coordinator Anticipated Follow Up Needs: HH/OP  Clinical Impression Pt is only oriented to self and place, did not answer my questions gave worker permission to all Mom. Mom provided information of homes-she has two steps and he has 14 steps into his apartment. Aware pt will need 24/7 care at discharge. Team evaluating and setting goals for stay. Will need PCP prior to discharge and will assess pt once appropriate.   Lucy Chris 03/16/2020, 1:57 PM

## 2020-03-16 NOTE — Progress Notes (Addendum)
[place] PHYSICAL MEDICINE & REHABILITATION PROGRESS NOTE  Subjective/Complaints: Patient seen sitting up at the edge of his bed this AM.  He states he slept well overnight.  He is working with therapies.    ROS: Unreliable due to cognition.  Objective: Vital Signs: Blood pressure 117/85, pulse 90, temperature 97.9 F (36.6 C), temperature source Oral, resp. rate 18, height 5\' 10"  (1.778 m), weight 90 kg, SpO2 94 %. No results found. Recent Labs    03/16/20 0701  WBC 5.4  HGB 13.0  HCT 40.9  PLT 385   Recent Labs    03/16/20 0701  NA 136  K 4.1  CL 101  CO2 26  GLUCOSE 109*  BUN 12  CREATININE 1.01  CALCIUM 10.4*    Intake/Output Summary (Last 24 hours) at 03/16/2020 1041 Last data filed at 03/16/2020 0845 Gross per 24 hour  Intake 440 ml  Output --  Net 440 ml     Pressure Injury 02/23/20 Heel Left Deep Tissue Pressure Injury - Purple or maroon localized area of discolored intact skin or blood-filled blister due to damage of underlying soft tissue from pressure and/or shear. (Active)  02/23/20 0800  Location: Heel  Location Orientation: Left  Staging: Deep Tissue Pressure Injury - Purple or maroon localized area of discolored intact skin or blood-filled blister due to damage of underlying soft tissue from pressure and/or shear.  Wound Description (Comments):   Present on Admission: No    Physical Exam: BP 117/85   Pulse 90   Temp 97.9 F (36.6 C) (Oral)   Resp 18   Ht 5\' 10"  (1.778 m)   Wt 90 kg   SpO2 94%   BMI 28.47 kg/m  Constitutional: No distress . Vital signs reviewed. HENT: Normocephalic.  Atraumatic. Eyes: EOMI. No discharge. Cardiovascular: No JVD.  RRR. Respiratory: Normal effort.  No stridor.  Bilateral clear to auscultation. GI: Non-distended.  BS +. Skin: Warm and dry.   Left ear lobe with healing wound  Psych: Slowed.  Delayed. Musc: No edema in extremities.  No tenderness in extremities. Neuro: Alert and oriented x2 Motor: 4 -/5  throughout Poor attention/concentration  Assessment/Plan: 1. Functional deficits which require 3+ hours per day of interdisciplinary therapy in a comprehensive inpatient rehab setting.  Physiatrist is providing close team supervision and 24 hour management of active medical problems listed below.  Physiatrist and rehab team continue to assess barriers to discharge/monitor patient progress toward functional and medical goals   Care Tool:  Bathing              Bathing assist       Upper Body Dressing/Undressing Upper body dressing        Upper body assist      Lower Body Dressing/Undressing Lower body dressing            Lower body assist       Toileting Toileting    Toileting assist       Transfers Chair/bed transfer  Transfers assist           Locomotion Ambulation   Ambulation assist              Walk 10 feet activity   Assist           Walk 50 feet activity   Assist           Walk 150 feet activity   Assist           Walk 10 feet  on uneven surface  activity   Assist           Wheelchair     Assist               Wheelchair 50 feet with 2 turns activity    Assist            Wheelchair 150 feet activity     Assist           Medical Problem List and Plan: 1.Functional and cognitive deficitssecondary 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 Please see conference note from today as well.  2. Antithrombotics: -DVT/anticoagulation:Pharmaceutical:Lovenox -antiplatelet therapy: N/A 3. Pain Management:N/A 4. Mood:LCSW to follow for evaluation and support. -antipsychotic agents: See #11. 5. Neuropsych: This patientis notcapable of making decisions  onhisown behalf. 6. Skin/Wound Care:Routine pressure relief measures. 7. Fluids/Electrolytes/Nutrition:Monitor I/Os.  8. T2DM with hyperglycemia: New diagnosis with A1C-7.1 on 1/19.  Carb modified diet.   Continue Levemir with metformin and meal coverage.   Use SSI for elevated BS  Monitor with increased mobility. 9.Acute blood loss anemia:Resolved  Hemoglobin 13.0 on 1/19 10. Elevated Triglycerides: 294 on 1/19 11. Psychosis d/o Schizophrenia. Last documented admission 11/20 (history of non-compliance)  Currently off Abiligy, Cogentin and Risperdal during hospitalization.   On Seroquel 100 mg bid and tends to fall asleep easily, will consider weaning Sleep wake chart.  LOS: 1 days A FACE TO FACE EVALUATION WAS PERFORMED  Bryan [name] Karis Juba 03/16/2020, 10:41 AM

## 2020-03-16 NOTE — Evaluation (Signed)
Speech Language Pathology Assessment and Plan  Patient Details  Name: Bryan [name] MRN: [id number] Date of Birth: 23-Jun-1975  SLP Diagnosis: Cognitive Impairments;Speech and Language deficits  Rehab Potential: Good ELOS: 2-3 weeks    Today's Date: 03/16/2020 SLP Individual Time: 1000-1100 SLP Individual Time Calculation (min): 60 min   Hospital Problem: Principal Problem:   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[name]) Active Problems:   Schizophrenia ([name])   Elevated triglycerides with high cholesterol   Controlled type 2 diabetes mellitus with hyperglycemia ([name])  Past Medical History:  Past Medical History:  Diagnosis Date  . Hypertension   . Psychiatric problem    Seen at [name] for unknown reason. Takes seroquel. History of hearing voices.  Not commandivng voices.   . Schizophrenia ([place])   . Tremors of nervous system    Past Surgical History:  Past Surgical History:  Procedure Laterality Date  . none    . TRACHEOSTOMY TUBE PLACEMENT N/A 02/13/2020   Procedure: TRACHEOSTOMY;  Surgeon: Leta Baptist, MD;  Location: [facility] OR;  Service: ENT;  Laterality: N/A;    Assessment / Plan / Recommendation   Patient is a 45 y.o. right handed male with history of HTN, schizophrenia who was admitted on 01/25/20 with hypotension, lethargy and found to have newly diagnosed DM with DKA with AKI. He was treated with IVF, IV insulin and started on broad spectrum antibiotics due toleucocytosis withconcerns of sepsis.On 11/30, patient found apneic and pulseless requiring two rounds of CPR with ACLS protocol with ROSC.He was intubated and work up done revealing moderate acute pancreatitis--likely due to elevated triglycerides-600. Abdominal ultrasound showed GB sludge without acute cholecystitis and fatty liver. 2D echo showed EF 70-75% with grade I DD. He was treated with supportive care and MRI brain done due to decrease in responsiveness-->negative for acute changes. He required tracheostomy 12/18 by Dr.  Benjamine Mola for VDRF. 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 He tolerated extubation and self decannulated on 01/06. Respiratory status has been stable and diet has been advanced to regular. Bouts of agitation has resolved and mentation improving but patient continued to be limited by cognitive deficits with diffuse weakness affecting overall functional status. Therapy working on pregait activity/standing attempts. Family has declined SNF and plans on providing care post discharge. CIR was recommended due to functional decline. Patient transferred to CIR on 03/15/2020 .    Clinical Impression Patient presents with a moderate cognitive-linguisitc disorder but with oropharyngeal swallow that appears [facility]. Swallow function appears WFL and of note, acute SLP had discharged swallow goals due to patient achieving. Patient's true baseline of cognition is not known but suspect he does have baseline deficits secondary to self-reported 8th grade education, premorbid diagnosis of schizophrenia with patient on disability and with self-reported most recent employment approximately 2 years ago. Patient is however presenting with likely significant change in baseline cognitive function secondary to suspected anoxia. Patient oriented to self and place but not situation. Affect is flat with patient responding to questions, but making no eye contact, speaking in monotone voice and not elaborating on comments unless cued. He recalled 2/4 words with 3-choice cues only and was not able to recall many details of daily tasks today. For some orientation questions, he appeared to be guessing without any consideration, such as when asked time of day (it was late morning) and he replied "4 or 5am" and then later when asked same question, stating that  it was "1 or 2pm". Patient also exhibited visual deficits versus cognitive-visual  impairment versus both. He reported difficulty with vision "in my blind spot" (peripheral) but this was inconsistent with his performance. He was able to point to alphabet letters and numbers printed on page in field of 5 with 70% accuracy. Patient will benefit from SLP intervention to maximize his cognitive-linguistic functioning.  Skilled Therapeutic Interventions          Cognitive-linguistic, speech, swallow evaluation  SLP Assessment  Patient will need skilled Speech Lanaguage Pathology Services during CIR admission    Recommendations  SLP Diet Recommendations: Thin;Age appropriate regular solids Liquid Administration via: Cup;Straw Medication Administration: Whole meds with liquid Supervision: Staff to assist with self feeding;Full supervision/cueing for compensatory strategies Compensations: Slow rate;Small sips/bites Postural Changes and/or Swallow Maneuvers: Seated upright 90 degrees Oral Care Recommendations: Oral care BID Recommendations for Other Services: Neuropsych consult Patient destination: Home Follow up Recommendations: Home Health SLP;24 hour supervision/assistance Equipment Recommended: None recommended by SLP    SLP Frequency 3 to 5 out of 7 days   SLP Duration  SLP Intensity  SLP Treatment/Interventions 2-3 weeks  Minumum of 1-2 x/day, 30 to 90 minutes  Cognitive remediation/compensation;Environmental controls;Functional tasks;Internal/external aids;Patient/family education;Speech/Language facilitation;Cueing hierarchy    Pain Pain Assessment Pain Scale: 0-10 Pain Score: 0-No pain  Prior Functioning Cognitive/Linguistic Baseline: Information not available Type of Home: House  Lives With: Family Available Help at Discharge: Family Education: patient reported 8th grade education Vocation: Other (Comment) (patient reported has not worked in two years but had done some temp work prior to that)  SLP Evaluation Cognition Overall Cognitive Status:  Impaired/Different from baseline Arousal/Alertness: Awake/alert Orientation Level: Oriented to person;Oriented to place;Disoriented to time;Disoriented to situation Attention: Sustained Sustained Attention: Impaired Sustained Attention Impairment: Functional basic;Verbal complex;Verbal basic Memory: Impaired Memory Impairment: Storage deficit;Decreased recall of new information (recalled 2/4 words after 3 minute delay with 3-choice cues only) Awareness: Impaired Awareness Impairment: Intellectual impairment Problem Solving: Impaired Problem Solving Impairment: Verbal basic;Functional basic Executive Function: Initiating Initiating: Impaired Initiating Impairment: Functional basic;Verbal basic Safety/Judgment: Impaired  Comprehension Auditory Comprehension Overall Auditory Comprehension: Impaired Yes/No Questions: Within Functional Limits Commands: Impaired Two Step Basic Commands: 50-74% accurate Multistep Basic Commands: Not tested Conversation: Simple Interfering Components: Attention;Working memory;Visual impairments;Processing speed EffectiveTechniques: Extra processing time;Slowed speech;Visual/Gestural cues Visual Recognition/Discrimination Discrimination: Exceptions to [facility] Pictures: Able in field of 3 Other Visual Recogniton/Discrimination Comments: patient reported difficulty with what appeared to be peripheral vision. Had difficulty pointing to identify numbers and letters in field of 4 Reading Comprehension Reading Status: Impaired Word level: Impaired Sentence Level: Impaired Paragraph Level: Not tested Functional Environmental (signs, name badge): Impaired Interfering Components: Attention;Visual scanning;Processing time;Visual acuity;Working Engineer, technical sales: Visual cueing;Tactile cueing;Verbal cueing Expression Expression Primary Mode of Expression: Verbal Verbal Expression Overall Verbal Expression: Impaired Initiation: No impairment Level of  Generative/Spontaneous Verbalization: Phrase Repetition: No impairment Naming: Impairment Responsive: 51-75% accurate Confrontation: Impaired Pictures: Able in field of 3 Convergent: 75-100% accurate Divergent: Not tested Pragmatics: Impairment Impairments: Abnormal affect;Eye contact;Monotone Interfering Components: Attention Effective Techniques: Open ended questions;Semantic cues Non-Verbal Means of Communication: Not applicable Written Expression Dominant Hand: Right Written Expression: Not tested Oral Motor Oral Motor/Sensory Function Overall Oral Motor/Sensory Function: Within functional limits Motor Speech Overall Motor Speech: Appears within functional limits for tasks assessed Intelligibility: Intelligible  Care Tool Care Tool Cognition Expression of Ideas and Wants Expression of Ideas and Wants: Some difficulty - exhibits some difficulty with expressing needs and ideas (e.g, some words or  finishing thoughts) or speech is not clear   Understanding Verbal and Non-Verbal Content Understanding Verbal and Non-Verbal Content: Usually understands - understands most conversations, but misses some part/intent of message. Requires cues at times to understand   Memory/Recall Ability *first 3 days only Memory/Recall Ability *first 3 days only: That he or she is in a hospital/hospital unit      Intelligibility: Intelligible  Bedside Swallowing Assessment General Date of Onset: 01/25/20 Previous Swallow Assessment: BSE, MBS during Acute care stay Diet Prior to this Study: Regular;Thin liquids Temperature Spikes Noted: No Respiratory Status: Room air Length of Intubations (days): 19 days Date extubated: 03/03/20 Behavior/Cognition: Alert;Cooperative;Pleasant mood;Requires cueing Oral Cavity - Dentition: Adequate natural dentition Self-Feeding Abilities: Needs assist;Needs set up Patient Positioning: Upright in bed Baseline Vocal Quality: Normal Volitional Cough:  Strong Volitional Swallow: Able to elicit  Oral Care Assessment   Ice Chips   Thin Liquid Thin Liquid: Within functional limits Presentation: Straw Puree Puree: Not tested Solid Solid: Not tested BSE Assessment Risk for Aspiration Impact on safety and function: Mild aspiration risk Other Related Risk Factors: Prolonged intubation;Deconditioning  Short Term Goals: Week 1: SLP Short Term Goal 1 (Week 1): Patient will orient to date/approximate time with modA cues for using visual/orientation aides. SLP Short Term Goal 2 (Week 1): Patient will recall at least 3 different tasks completed during day (in therapy/nursing care) with modA cues SLP Short Term Goal 3 (Week 1): Patient will perform basic level ADL's (feeding-self during meals, etc) and problem solving tasks with modA cues. SLP Short Term Goal 4 (Week 1): Patient will initiate during structured conversation and/or simulated tasks with mod-maxA cues. SLP Short Term Goal 5 (Week 1): Patient will demonstrate awareness to basic level errors during functional and simulated ADL tasks with modA  Refer to Care Plan for Long Term Goals  Recommendations for other services: Neuropsych  Discharge Criteria: Patient will be discharged from SLP if patient refuses treatment 3 consecutive times without medical reason, if treatment goals not met, if there is a change in medical status, if patient makes no progress towards goals or if patient is discharged from hospital.  The above assessment, treatment plan, treatment alternatives and goals were discussed and mutually agreed upon: by patient  Sonia Baller, MA, CCC-SLP Speech Therapy

## 2020-03-16 NOTE — Progress Notes (Signed)
Initial Nutrition Assessment  RD working remotely.  DOCUMENTATION CODES:   Not applicable  INTERVENTION:   - Continue ProSource Plus 30 ml TID, each supplement provides 100 kcal and 15 grams of protein  - Continue MVI with minerals daily  - RD will follow up for ability to provide diabetes diet education to pt and family  NUTRITION DIAGNOSIS:   Increased nutrient needs related to wound healing,other (therapies) as evidenced by estimated needs.  GOAL:   Patient will meet greater than or equal to 90% of their needs  MONITOR:   PO intake,Supplement acceptance,Labs,Weight trends,Skin,I & O's  REASON FOR ASSESSMENT:   Consult Diet education  ASSESSMENT:   45 year old male with PMH of HTN and schizophrenia who was admitted on 01/25/20 with newly diagnosed DM with DKA with AKI. On 11/30, patient found apneic and pulseless requiring two rounds of CPR with ACLS protocol with ROSC. Pt was intubated and work-up done revealing moderate acute pancreatitis. Pt required tracheostomy 12/18. Pt tolerated extubation and self decannulated on 03/03/20. Admitted to CIR on 1/18.   Unable to reach pt via phone call to room. Per review of notes, pt only oriented to self and place. Unable to obtain diet and weight history at this time. RD will attempt to obtain history upon follow-up. Will also plan to provide DM diet education on follow-up.  Reviewed available weight history in chart. Weight has trended up over the last 2.5 years. Per RN edema assessment, pt with mild pitting edema to BUE and BLE. Edema may be falsely elevating pt's weight.  Meal Completion: 75% x 2 documented meals  Medications reviewed and include: ProSource Plus TID, colace, SSI, novolog 5 units TID with meals, levemir 30 units daily, metformin, MVI with minerals, protonix  Labs reviewed: hemoglobin A1C 7.1 CBG's: 98-133 x 24 hours  NUTRITION - FOCUSED PHYSICAL EXAM:  Unable to complete at this time. RD working  remotely.  Diet Order:   Diet Order            Diet Carb Modified Fluid consistency: Thin; Room service appropriate? Yes  Diet effective now                 EDUCATION NEEDS:   Not appropriate for education at this time  Skin:  Skin Assessment: Skin Integrity Issues: DTI: left heel Incisions: neck  Last BM:  03/13/20  Height:   Ht Readings from Last 1 Encounters:  03/15/20 5\' 10"  (1.778 m)    Weight:   Wt Readings from Last 1 Encounters:  03/15/20 90 kg    BMI:  Body mass index is 28.47 kg/m.  Estimated Nutritional Needs:   Kcal:  2200-2400  Protein:  130-150 grams  Fluid:  2.2-2.4 L    03/17/20, MS, RD, LDN Inpatient Clinical Dietitian Please see AMi[facility] for contact information.

## 2020-03-16 NOTE — Patient Care Conference (Signed)
Inpatient RehabilitationTeam Conference and Plan of Care Update Date: 03/16/2020   Time: 11:52 AM    Patient Name: Bryan [name]      Medical Record Number: [id number]  Date of Birth: 1975-09-07 Sex: Male         Room/Bed: 4W12C/4W12C-01 Payor Info: Payor: MEDICAID  / Plan: MEDICAID [place] ACCESS / Product Type: *No Product type* /    Admit Date/Time:  03/15/2020  5:13 PM  Primary Diagnosis:  Anoxic brain injury [facility])  Hospital Problems: Principal Problem:   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[facility]) Active Problems:   Schizophrenia ([facility])   Elevated triglycerides with high cholesterol   Controlled type 2 diabetes mellitus with hyperglycemia ([facility])    Expected Discharge Date: Expected Discharge Date:  (Initial evals pending-2-3 week ELOS)  Team Members Present: Physician leading conference: Dr. Maryla Morrow Care Coodinator Present: Chana Bode, RN, BSN, CRRN;Becky Dupree, LCSW Nurse Present: Regino Schultze, RN PT Present: Raechel Chute, PT OT Present: Rosalio Loud, OT SLP Present: Elio Forget, SLP PPS Coordinator present : Fae Pippin, SLP     Current Status/Progress Goal Weekly Team Focus  Bowel/Bladder   urinary incontinence- pt has primo fit occasional bowel incontinence LBM 11/16  pt will be continent of B/B with normal bowel pattern  toilet pt Q2H and prn   Swallow/Nutrition/ Hydration             ADL's   mod A bed mobility, total assist bathing and dressing, impaired cognition, decreased functional use of BUE for bathing and dressing  Min A overall  ADL retraining, activity tolerance, endurance, functional use of BUE, sequencing, problem solving   Mobility             Communication             Safety/Cognition/ Behavioral Observations            Pain   pt c/o HA medicated with tylenol 650mg  prn with relief  pt will be free of pain  assess pt qshift and prn medicated as directed inform MD if pain remains unrelieved   Skin   L ear abrasion Stage II L heel 3x5 cm  granulating foam on heel healed trach site  pt will have resolution of current issues no further breakdown or infection  assess skin qshift and prn     Discharge Planning:  new patient being evaluated today. Plan to go home with Mom who is a CNA unsure if she works?   Team Discussion: New diagnosis of DM and history of schizophrenia. Patient is cognitively off, disoriented , uncoordinated and slow processing.  Patient on target to meet rehab goals: yes, currently requires total assist for ADLS and eating. +2 assist to stand with a stedy. Min assist goals set for discharge.  *See Care Plan and progress notes for long and short-term goals.   Revisions to Treatment Plan:   Teaching Needs: Transfers, toileting, medications, etc.  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  Barriers to Discharge: Medical stability;New diabetic;Home enviroment access/layout;Decreased family/caregiver support   Possible Resolutions to Focus: Therapies, optimize DM meds, wean antipsychotics   Continued Need for Acute Rehabilitation Level of Care: The patient requires daily medical management by a physician with specialized training in physical medicine  and rehabilitation for the following reasons: Direction of a multidisciplinary physical rehabilitation program to maximize functional independence : Yes Medical management of patient stability for increased activity during participation in an intensive rehabilitation regime.: Yes Analysis of laboratory values and/or radiology reports with any subsequent need for medication adjustment and/or medical intervention. :  Yes   I attest that I was present, lead the team conference, and concur with the assessment and plan of the team.   Chana Bode B 03/16/2020, 4:02 PM

## 2020-03-17 ENCOUNTER — Inpatient Hospital Stay (HOSPITAL_COMMUNITY): Payer: Medicaid Other

## 2020-03-17 ENCOUNTER — Inpatient Hospital Stay (HOSPITAL_COMMUNITY): Payer: Medicaid Other | Admitting: Occupational Therapy

## 2020-03-17 ENCOUNTER — Inpatient Hospital Stay (HOSPITAL_COMMUNITY): Payer: Medicaid Other | Admitting: Speech Pathology

## 2020-03-17 DIAGNOSIS — R7989 Other specified abnormal findings of blood chemistry: Secondary | ICD-10-CM

## 2020-03-17 DIAGNOSIS — Z794 Long term (current) use of insulin: Secondary | ICD-10-CM

## 2020-03-17 LAB — GLUCOSE, CAPILLARY
Glucose-Capillary: 108 mg/dL — ABNORMAL HIGH (ref 70–99)
Glucose-Capillary: 133 mg/dL — ABNORMAL HIGH (ref 70–99)
Glucose-Capillary: 112 mg/dL — ABNORMAL HIGH (ref 70–99)
Glucose-Capillary: 131 mg/dL — ABNORMAL HIGH (ref 70–99)

## 2020-03-17 MED ORDER — BLOOD PRESSURE CONTROL BOOK
Freq: Once | Status: AC
  Filled 2020-03-17: qty 1

## 2020-03-17 MED ORDER — QUETIAPINE FUMARATE 50 MG PO TABS
50.0000 mg | ORAL_TABLET | Freq: Every morning | ORAL | Status: DC
  Administered 2020-03-17 – 2020-04-08 (×22): 50 mg via ORAL
  Filled 2020-03-17 (×22): qty 1

## 2020-03-17 MED ORDER — INFLUENZA VAC SPLIT QUAD 0.5 ML IM SUSY
0.5000 mL | PREFILLED_SYRINGE | INTRAMUSCULAR | Status: AC
  Administered 2020-03-18: 0.5 mL via INTRAMUSCULAR

## 2020-03-17 MED ORDER — LIVING WELL WITH DIABETES BOOK
Freq: Once | Status: AC
  Filled 2020-03-17: qty 1

## 2020-03-17 NOTE — Progress Notes (Signed)
Speech Language Pathology Daily Session Note  Patient Details  Name: Bryan [name] MRN: [id number] Date of Birth: [birth year]  Today's Date: 03/17/2020 SLP Individual Time: 1000-1100 SLP Individual Time Calculation (min): 60 min  Short Term Goals: Week 1: SLP Short Term Goal 1 (Week 1): Patient will orient to date/approximate time with modA cues for using visual/orientation aides. SLP Short Term Goal 2 (Week 1): Patient will recall at least 3 different tasks completed during day (in therapy/nursing care) with modA cues SLP Short Term Goal 3 (Week 1): Patient will perform basic level ADL's (feeding-self during meals, etc) and problem solving tasks with modA cues. SLP Short Term Goal 4 (Week 1): Patient will initiate during structured conversation and/or simulated tasks with mod-maxA cues. SLP Short Term Goal 5 (Week 1): Patient will demonstrate awareness to basic level errors during functional and simulated ADL tasks with modA  Skilled Therapeutic Interventions:   Patient seen for skilled ST session focusing on cognitive-linguistic goals. Of note, patient more interactive today and making eye contact with SLP. Upon entering room, patient in [facility] and SLP then took him to speech therapy room. During introduction to Peg board task, patient frequently describing visual impairments such as "trouble seeing in my blind spot" (peripheral vision?) and that he was having trouble seeing how close or far things were. In addition, patient exhibited decreased ROM with UE and seemed to have stiffness in UE's. He required mod-maxA cues to perform task of picking up colored peg and placing in on peg board, even with SLP pointing to the spot to place. Patient able to identify as well as name colors, pictures. He participated in trial of BITS program and demonstrated very good recall of 3-4 word sequence. He again exhibited difficulty both with UE motor movements as well as visual scanning/attention deficits. Patient  continues to benefit from skilled SLP intervention to maximize cognitive-linguistic function prior to discharge.  Pain Pain Assessment Pain Scale: 0-10 Pain Score: 0-No pain  Therapy/Group: Individual Therapy  Angela Nevin, MA, CCC-SLP Speech Therapy

## 2020-03-17 NOTE — Progress Notes (Signed)
[facility] PHYSICAL MEDICINE & REHABILITATION PROGRESS NOTE  Subjective/Complaints: Patient seen laying in bed this morning.  He states he slept well overnight.  No reported issues overnight.  He does not recall how therapies went yesterday.  ROS: Unreliable due to cognition  Objective: Vital Signs: Blood pressure 108/86, pulse 86, temperature 97.9 F (36.6 C), temperature source Oral, resp. rate 17, height 5\' 10"  (1.778 m), weight 90 kg, SpO2 94 %. No results found. Recent Labs    03/16/20 0701  WBC 5.4  HGB 13.0  HCT 40.9  PLT 385   Recent Labs    03/16/20 0701  NA 136  K 4.1  CL 101  CO2 26  GLUCOSE 109*  BUN 12  CREATININE 1.01  CALCIUM 10.4*    Intake/Output Summary (Last 24 hours) at 03/17/2020 1020 Last data filed at 03/17/2020 0532 Gross per 24 hour  Intake 170 ml  Output 900 ml  Net -730 ml     Pressure Injury 02/23/20 Heel Left Deep Tissue Pressure Injury - Purple or maroon localized area of discolored intact skin or blood-filled blister due to damage of underlying soft tissue from pressure and/or shear. (Active)  02/23/20 0800  Location: Heel  Location Orientation: Left  Staging: Deep Tissue Pressure Injury - Purple or maroon localized area of discolored intact skin or blood-filled blister due to damage of underlying soft tissue from pressure and/or shear.  Wound Description (Comments):   Present on Admission: No    Physical Exam: BP 108/86 (BP Location: Left Arm)   Pulse 86   Temp 97.9 F (36.6 C) (Oral)   Resp 17   Ht 5\' 10"  (1.778 m)   Wt 90 kg   SpO2 94%   BMI 28.47 kg/m  Constitutional: No distress . Vital signs reviewed. HENT: Normocephalic.  Atraumatic. Eyes: EOMI. No discharge. Cardiovascular: No JVD.  RRR. Respiratory: Normal effort.  No stridor.  Bilateral clear to auscultation. GI: Non-distended.  BS +. Skin: Warm and dry.   Left earlobe wound healing Psych: Slowed.  Confused.  Delayed. Musc: No edema in extremities.  No  tenderness in extremities. Neuro: Alert and oriented x2 Motor: 4-/5 throughout with apraxia Poor attention/concentration  Assessment/Plan: 1. Functional deficits which require 3+ hours per day of interdisciplinary therapy in a comprehensive inpatient rehab setting.  Physiatrist is providing close team supervision and 24 hour management of active medical problems listed below.  Physiatrist and rehab team continue to assess barriers to discharge/monitor patient progress toward functional and medical goals   Care Tool:  Bathing    Body parts bathed by patient: Chest,Face   Body parts bathed by helper: Right arm,Left arm,Abdomen,Front perineal area,Buttocks,Right upper leg,Left upper leg,Right lower leg,Left lower leg,Face     Bathing assist Assist Level: Total Assistance - Patient < 25%     Upper Body Dressing/Undressing Upper body dressing   What is the patient wearing?: Pull over shirt    Upper body assist Assist Level: Total Assistance - Patient < 25%    Lower Body Dressing/Undressing Lower body dressing      What is the patient wearing?: Pants,Incontinence brief     Lower body assist Assist for lower body dressing: Total Assistance - Patient < 25%     Toileting Toileting    Toileting assist Assist for toileting: Dependent - Patient 0%     Transfers Chair/bed transfer  Transfers assist     Chair/bed transfer assist level: Dependent - mechanical lift     Locomotion Ambulation  Ambulation assist   Ambulation activity did not occur: Safety/medical concerns (fatigue, poor motor control/planning, weakness)          Walk 10 feet activity   Assist  Walk 10 feet activity did not occur: Safety/medical concerns (fatigue, poor motor control/planning, weakness)        Walk 50 feet activity   Assist Walk 50 feet with 2 turns activity did not occur: Safety/medical concerns (fatigue, poor motor control/planning, weakness)         Walk 150 feet  activity   Assist Walk 150 feet activity did not occur: Safety/medical concerns (fatigue, poor motor control/planning, weakness)         Walk 10 feet on uneven surface  activity   Assist Walk 10 feet on uneven surfaces activity did not occur: Safety/medical concerns (fatigue, poor motor control/planning, weakness)         Wheelchair     Assist Will patient use wheelchair at discharge?: Yes Type of Wheelchair: Manual    Wheelchair assist level: Moderate Assistance - Patient 50 - 74% Max wheelchair distance: 61ft    Wheelchair 50 feet with 2 turns activity    Assist        Assist Level: Maximal Assistance - Patient 25 - 49%   Wheelchair 150 feet activity     Assist      Assist Level: Total Assistance - Patient < 25%    Medical Problem List and Plan: 1.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 Antithrombotics: -DVT/anticoagulation:Pharmaceutical:Lovenox -antiplatelet therapy: N/A 3. Pain Management:N/A 4. Mood:LCSW to follow for evaluation and support. -antipsychotic agents: See #11. 5. Neuropsych: This patientis notcapable of making decisions onhisown behalf. 6. Skin/Wound Care:Routine pressure relief measures. 7. Fluids/Electrolytes/Nutrition:Monitor I/Os.  8. T2DM with hyperglycemia: New diagnosis with A1C-7.1 on 1/19.  Carb modified diet.   Continue Levemir with metformin and meal coverage.   Use SSI for elevated BS  Controlled on 1/20  Monitor with increased mobility. 9.Acute blood loss anemia:Resolved  Hemoglobin 13.0 on 1/19 10. Elevated Triglycerides: 294 on 1/19 11. Psychosis d/o Schizophrenia. Last documented admission 11/20 (history of non-compliance)  Currently off Abiligy, Cogentin and Risperdal during hospitalization.   Continue Seroquel 100 nightly  Will trial decreasing Seroquel to 50 daily on 1/20 to improve daytime  alertness Sleep wake chart.  Will consider psych consult 12.  Creatinine trending up  Creatinine 1.01 on 1/19, labs ordered for tomorrow  LOS: 2 days A FACE TO FACE EVALUATION WAS PERFORMED  Ankit Karis Juba 03/17/2020, 10:20 AM

## 2020-03-17 NOTE — Progress Notes (Signed)
Occupational Therapy Session Note  Patient Details  Name: Bryan [name] MRN: [id number] Date of Birth: 13-Sep-1975  Today's Date: 03/17/2020 OT Individual Time: 6962-9528 OT Individual Time Calculation (min): 64 min    Short Term Goals: Week 1:  OT Short Term Goal 1 (Week 1): Pt will complete bathing with mod assist OT Short Term Goal 2 (Week 1): Pt will complete UB dressing with mod assist OT Short Term Goal 3 (Week 1): Pt will complete LB dressing with max assist of one caregiver at sit > stand level OT Short Term Goal 4 (Week 1): Pt will complete toilet transfer with max assist of one caregiver  Skilled Therapeutic Interventions/Progress Updates:     Patient in bed, alert and pleasant.   He is cooperative t/o session, denies pain and presents as moderately confused and disoriented.  He completed LB bathing and dressing at bed level with max A/dep due to difficulty with reaching and coordination impairment.   He is able to bridge to pull pants over hips with max A.  Rolling in bed with CGA and cues.  Supine to sitting edge of bed with mod A.  He is able to tolerate unsupported sitting for 30 minutes with CGA and cues for upright posture - during this time he completed UB bathing and dressing overall max A for thoroughness and initiation, eating breakfast - requires max A for scooping and spearing food, able to bring fork or straw to mouth once placed in his hand.  Sit pivot transfer bed to w/c with max A, cues for directionality and safe technique.  He remained seated in w/c at close of session, seat belt alarm set, tray table and call bell in reach.    Therapy Documentation Precautions:  Precautions Precautions: Fall Required Braces or Orthoses: Other Brace Other Brace: L wrist cock up splint to be worn during day per RN - per [facility] care chart (did not have during CIR eval) Restrictions Weight Bearing Restrictions: No   Therapy/Group: Individual Therapy  Barrie Lyme 03/17/2020,  7:30 AM

## 2020-03-17 NOTE — Progress Notes (Signed)
Physical Therapy Session Note  Patient Details  Name: Bryan [name] MRN: [id number] Date of Birth: 09-Jan-1976  Today's Date: 03/17/2020 PT Individual Time: 1415-1500 PT Individual Time Calculation (min): 45 min  Today's Date: 03/17/2020 PT Missed Time: 30 Minutes Missed Time Reason: Patient fatigue  Short Term Goals: Week 1:  PT Short Term Goal 1 (Week 1): pt will perform all bed mobility with min A overall PT Short Term Goal 2 (Week 1): pt will transfer sit<>stand with LRAD and min A PT Short Term Goal 3 (Week 1): Pt will initiate gait training with LRAD  Skilled Therapeutic Interventions/Progress Updates:   Received pt supine in bed asleep, pt aroused to verbal stimuli and agreeable to therapy, and denied any pain during session. Pt reported feeling "exhausted" frm previous therapies and declined OOB mobility but was agreeable to bed level exercises. Session with emphasis on generalized strengthening, motor control/sequencing, and improved activity tolerance. Pt with increased difficulty with motor planning and sequencing of movements throughout session. Pt performed the following exercises with CGA/min A and verbal/tactile cues for technique: -heel slides x10 bilaterally -bridges 2x5 (with with decreased control and unsteadiness) -hip adduction towel squeezes x10 -ankle circles x12 clockwise and counterclockwise bilaterally (Pt with increased difficulty with motor control/coordination of LLE requiring active assist from therapist). -SLR x8 on LLE and x9 on RLE -hip abduction x8 bilaterally  Pt rolled to R with min A and use of bedrails and performed clamshells x10 and hip abduction x8 with min A  Rolled on L side and pt reported pain in R glute. Inspected area but no abnormalities noted. Pt reported it hurts along R glute when he attempts to lift his leg. Pt required min A to roll back to supine and therapist positioned pillows underneath buttocks for pressure relief/comfort and pt  scooted to [facility] with supervision and mod cues for technique for scooting himself up by pulling on headboard. Pt oriented to place and situation during session but disoriented to date and time. Concluded session with pt supine in bed, needs within reach, and bed alarm on. 30 minutes missed of skilled physical therapy due to fatigue.   Therapy Documentation Precautions:  Precautions Precautions: Fall Required Braces or Orthoses: Other Brace Other Brace: L wrist cock up splint to be worn during day per RN - per [facility] care chart (did not have during CIR eval) Restrictions Weight Bearing Restrictions: No  Therapy/Group: Individual Therapy Martin Majestic PT, DPT   03/17/2020, 7:30 AM

## 2020-03-18 ENCOUNTER — Inpatient Hospital Stay (HOSPITAL_COMMUNITY): Payer: Medicaid Other | Admitting: Speech Pathology

## 2020-03-18 ENCOUNTER — Inpatient Hospital Stay (HOSPITAL_COMMUNITY): Payer: Medicaid Other

## 2020-03-18 ENCOUNTER — Inpatient Hospital Stay (HOSPITAL_COMMUNITY): Payer: Medicaid Other | Admitting: Occupational Therapy

## 2020-03-18 DIAGNOSIS — E871 Hypo-osmolality and hyponatremia: Secondary | ICD-10-CM

## 2020-03-18 LAB — BASIC METABOLIC PANEL
Anion gap: 12 (ref 5–15)
BUN: 16 mg/dL (ref 6–20)
CO2: 24 mmol/L (ref 22–32)
Calcium: 10.1 mg/dL (ref 8.9–10.3)
Chloride: 98 mmol/L (ref 98–111)
Creatinine, Ser: 0.94 mg/dL (ref 0.61–1.24)
GFR, Estimated: >60 mL/min (ref >60)
Glucose, Bld: 117 mg/dL — ABNORMAL HIGH (ref 70–99)
Potassium: 4.2 mmol/L (ref 3.5–5.1)
Sodium: 134 mmol/L — ABNORMAL LOW (ref 135–145)

## 2020-03-18 LAB — GLUCOSE, CAPILLARY
Glucose-Capillary: 113 mg/dL — ABNORMAL HIGH (ref 70–99)
Glucose-Capillary: 121 mg/dL — ABNORMAL HIGH (ref 70–99)
Glucose-Capillary: 98 mg/dL (ref 70–99)
Glucose-Capillary: 109 mg/dL — ABNORMAL HIGH (ref 70–99)

## 2020-03-18 MED ORDER — FLUOXETINE HCL 10 MG PO CAPS
10.0000 mg | ORAL_CAPSULE | Freq: Once | ORAL | Status: AC
  Administered 2020-03-18: 10 mg via ORAL
  Filled 2020-03-18: qty 1

## 2020-03-18 MED ORDER — FLUOXETINE HCL 20 MG PO CAPS
20.0000 mg | ORAL_CAPSULE | Freq: Every day | ORAL | Status: DC
  Administered 2020-03-19 – 2020-04-08 (×21): 20 mg via ORAL
  Filled 2020-03-18 (×21): qty 1

## 2020-03-18 NOTE — Consult Note (Signed)
[facility] Face-to-Face Psychiatry Consult   Reason for Consult:  medication management, would like to reduce Seroquel and add Ritalin for activation and attention. Referring Physician:  Dr. Allena Katz Patient Identification: Bryan [name] MRN:  [id number] Principal Diagnosis: Anoxic brain injury [facility]) Diagnosis:  Principal Problem:   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HCC) Active Problems:   Schizophrenia (HCC)   Elevated triglycerides with high cholesterol   Controlled type 2 diabetes mellitus with hyperglycemia (HCC)   Elevated serum creatinine   Hyponatremia   Total Time spent with patient: 45 minutes  Subjective:   Bryan [name] is a 45 y.o. male patient admitted with diabetic ketoacidosis, subsequently found apneic and pulseless requiring CPR.  Patient with extended length of stay secondary to complications of anoxic brain injury, diffuse encephalopathy, and cognitive decline.  Patient is seen in between his therapy sessions, is observed to be lying in bed watching television.  He does appear to be engage with Clinical research associate, and is able to turn down the volume of the television in an attempt to proceed with the evaluation appropriately.  Patient acknowledges history of schizophrenia, that has required multiple inpatient hospitalizations secondary to noncompliance.  Patient does acknowledge a history of positive symptoms that include auditory hallucinations, delusional thinking, paranoia, however declines any of these at this time.  Patient also acknowledges negative symptoms to include noncompliance with medication, poor self care, self-neglect, withdrawal, and isolation, however declines any of those symptoms at this time.  Patient reports he has been compliant with his Seroquel, and feels it is effective in managing his schizophrenic symptoms.  He denies any psychosis, paranoia, delusional thinking, and auditory visual hallucinations.  He also denies any suicidal ideations, homicidal ideations, any auditory or visual  hallucinations.  Psych consult place for medication management, would like to reduce Seroquel and add Ritalin for activation and attention.  HPI:  Bryan [name] is a 45 year old male with history of HTN, schizophrenia who was admitted on 01/25/20 with hypotension, lethargy and found to have newly diagnosed DM with DKA with AKI. He was treated with IVF, IV insulin and started on broad spectrum antibiotics due to leucocytosis with concerns of sepsis. On 11/30, patient found apneic and pulseless requiring two rounds of CPR with ACLS protocol with ROSC.   He was intubated and work up done revealing moderate acute pancreatitis--likely due to elevated triglycerides-600. Abdominal ultrasound showed GB sludge without acute cholecystitis and fatty liver.  2D echo showed EF 70-75% with grade I DD.   He was treated with supportive care and MRI brain done due to decrease in responsiveness-->negative for acute changes. He required tracheostomy 12/18  by Dr. Suszanne Conners for VDRF. 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 He tolerated extubation and self decannulated on 01/06. Respiratory status has been stable and diet has been advanced to regular. Bouts of agitation has resolved and mentation improving but patient continued to be limited by cognitive deficits with diffuse weakness affecting overall functional status. Therapy working on pregait activity/standing attempts. Family has declined SNF and plans on providing care post discharge. CIR was recommended due to functional decline.    Patient is alert and oriented, awake and attentive during this evaluation.  Patient does appear to have some delay in his thought processes, however answers all questions appropriately.  He is able to follow commands and was able to demonstrate appropriate concentration and attention . He denies any current hallucinations,  psychosis,  paranoia, delusional thinking.  He does not appear to be responding to internal stimuli or preoccupied.  He also denies any suicidal ideations, homicidal ideations, and or auditory visual hallucinations.  Patient feels his current medications are working for him to help manage his schizophrenia, as he has tried multiple antipsychotics in the past.   Past Psychiatric History: Schizophrenia.  Has tried multiple antipsychotics, most recent treatment regimen included Abilify Maintena 400 mg IM q. monthly and Risperdal 3 mg p.o. nightly.  Last inpatient hospitalization was in January 2020 to [facility] under the care of Dr. Jeannine KittenFarah.  At that time he was discharged with ACT team services for management in the community.  He denies any previous suicide attempt.  Risk to Self:  Denies Risk to Others:  Denies Prior Inpatient Therapy:  Multiple previous inpatient admissions historical chart review indicates from 3620 13-20 20, for schizophrenia and delusional disorder. Prior Outpatient Therapy:  None at this time  Past Medical History:  Past Medical History:  Diagnosis Date  . Hypertension   . Psychiatric problem    Se[facility] for unknown reason. Takes seroquel. History of hearing voices.  Not commandivng voices.   . Schizophrenia (HCC)   . Tremors of nervous system     Past Surgical History:  Procedure Laterality Date  . none    . TRACHEOSTOMY TUBE PLACEMENT N/A 02/13/2020   Procedure: TRACHEOSTOMY;  Surgeon: Newman Pieseoh, Su, MD;  Location: [facility] OR;  Service: ENT;  Laterality: N/A;   Family History: History reviewed. No pertinent family history. Family Psychiatric  History: Noncontributory, historical chart review indicates significant family history of mental illness. Social History:  Social History   Substance and Sexual Activity  Alcohol Use Yes   Comment: says drinks a 40 at least once a week. denies drink in 2-3 weeks.      Social History   Substance and Sexual  Activity  Drug Use Yes  . Types: Cocaine, Marijuana   Comment: marijuana somewhat regularly    Social History   Socioeconomic History  . Marital status: Married    Spouse name: Not on file  . Number of children: Not on file  . Years of education: Not on file  . Highest education level: Not on file  Occupational History  . Not on file  Tobacco Use  . Smoking status: Current Every Day Smoker    Packs/day: 0.50    Types: Cigarettes  . Smokeless tobacco: Never Used  Vaping Use  . Vaping Use: Never used  Substance and Sexual Activity  . Alcohol use: Yes    Comment: says drinks a 40 at least once a week. denies drink in 2-3 weeks.   . Drug use: Yes    Types: Cocaine, Marijuana    Comment: marijuana somewhat regularly  . Sexual activity: Never  Other Topics Concern  . Not on file  Social History Narrative   [place] in [place] in an [place] with his brother. Unclear why on medicaid.   Social Determinants of Health   Financial Resource Strain: Not on file  Food Insecurity: Not on file  Transportation Needs: Not on file  Physical Activity: Not on file  Stress: Not on file  Social Connections: Not on file   Additional Social History:    Allergies:   Allergies  Allergen Reactions  . Fluphenazine Shortness Of Breath and Swelling    Tongue swelling, and shaking  . Coffee Bean Extract [Coffea Arabica] Swelling  . Vancomycin Rash  Bullous rash over trunk, arms, and face     Labs:  Results for orders placed or performed during the hospital encounter of 03/15/20 (from the past 48 hour(s))  Glucose, capillary     Status: None   Collection Time: 03/16/20  4:34 PM  Result Value Ref Range   Glucose-Capillary 97 70 - 99 mg/dL    Comment: Glucose reference range applies only to samples taken after fasting for at least 8 hours.   Comment 1 Notify RN   Glucose, capillary     Status: None   Collection Time: 03/16/20  9:03 PM  Result Value Ref Range   Glucose-Capillary 87  70 - 99 mg/dL    Comment: Glucose reference range applies only to samples taken after fasting for at least 8 hours.  Glucose, capillary     Status: Abnormal   Collection Time: 03/17/20  6:07 AM  Result Value Ref Range   Glucose-Capillary 108 (H) 70 - 99 mg/dL    Comment: Glucose reference range applies only to samples taken after fasting for at least 8 hours.  Glucose, capillary     Status: Abnormal   Collection Time: 03/17/20 11:50 AM  Result Value Ref Range   Glucose-Capillary 133 (H) 70 - 99 mg/dL    Comment: Glucose reference range applies only to samples taken after fasting for at least 8 hours.  Glucose, capillary     Status: Abnormal   Collection Time: 03/17/20  5:21 PM  Result Value Ref Range   Glucose-Capillary 112 (H) 70 - 99 mg/dL    Comment: Glucose reference range applies only to samples taken after fasting for at least 8 hours.  Glucose, capillary     Status: Abnormal   Collection Time: 03/17/20  8:56 PM  Result Value Ref Range   Glucose-Capillary 131 (H) 70 - 99 mg/dL    Comment: Glucose reference range applies only to samples taken after fasting for at least 8 hours.  Basic metabolic panel     Status: Abnormal   Collection Time: 03/18/20  5:16 AM  Result Value Ref Range   Sodium 134 (L) 135 - 145 mmol/L   Potassium 4.2 3.5 - 5.1 mmol/L   Chloride 98 98 - 111 mmol/L   CO2 24 22 - 32 mmol/L   Glucose, Bld 117 (H) 70 - 99 mg/dL    Comment: Glucose reference range applies only to samples taken after fasting for at least 8 hours.   BUN 16 6 - 20 mg/dL   Creatinine, Ser 6.570.94 0.61 - 1.24 mg/dL   Calcium 84.610.1 8.9 - 96.210.3 mg/dL   GFR, Estimated >95>60 >28>60 mL/min    Comment: (NOTE) Calculated using the CKD-EPI Creatinine Equation (2021)    Anion gap 12 5 - 15    Comment: Performed at [facility], [address] [place], [place] [address]  Glucose, capillary     Status: Abnormal   Collection Time: 03/18/20  5:51 AM  Result Value Ref Range   Glucose-Capillary 113  (H) 70 - 99 mg/dL    Comment: Glucose reference range applies only to samples taken after fasting for at least 8 hours.  Glucose, capillary     Status: Abnormal   Collection Time: 03/18/20 11:48 AM  Result Value Ref Range   Glucose-Capillary 121 (H) 70 - 99 mg/dL    Comment: Glucose reference range applies only to samples taken after fasting for at least 8 hours.   Comment 1 Notify RN  Current Facility-Administered Medications  Medication Dose Route Frequency Provider Last Rate Last Admin  . (feeding supplement) PROSource Plus liquid 30 mL  30 mL Oral TID BM Love, Evlyn Kanner, PA-C   30 mL at 03/18/20 0939  . acetaminophen (TYLENOL) tablet 325-650 mg  325-650 mg Oral Q4H PRN Jacquelynn Cree, PA-C   650 mg at 03/18/20 1053  . alum & mag hydroxide-simeth (MAALOX/MYLANTA) 200-200-20 MG/5ML suspension 30 mL  30 mL Oral Q4H PRN Love, Pamela S, PA-C      . bisacodyl (DULCOLAX) suppository 10 mg  10 mg Rectal Daily PRN Jacquelynn Cree, PA-C   10 mg at 03/18/20 0530  . diphenhydrAMINE (BENADRYL) 12.5 MG/5ML elixir 12.5-25 mg  12.5-25 mg Oral Q6H PRN Love, Pamela S, PA-C      . docusate sodium (COLACE) capsule 100 mg  100 mg Oral BID Delle Reining S, PA-C   100 mg at 03/18/20 0946  . enoxaparin (LOVENOX) injection 40 mg  40 mg Subcutaneous Q24H Delle Reining S, PA-C   40 mg at 03/17/20 1814  . Gerhardt's butt cream 1 application  1 application Topical BID Jacquelynn Cree, PA-C   1 application at 03/17/20 2005  . guaiFENesin-dextromethorphan (ROBITUSSIN DM) 100-10 MG/5ML syrup 5-10 mL  5-10 mL Oral Q6H PRN Love, Pamela S, PA-C      . insulin aspart (novoLOG) injection 0-5 Units  0-5 Units Subcutaneous QHS Love, Pamela S, PA-C      . insulin aspart (novoLOG) injection 0-9 Units  0-9 Units Subcutaneous TID WC LoveEvlyn Kanner, PA-C   1 Units at 03/18/20 1229  . insulin aspart (novoLOG) injection 5 Units  5 Units Subcutaneous TID WC LoveEvlyn Kanner, PA-C   5 Units at 03/18/20 1230  . insulin detemir (LEVEMIR)  injection 30 Units  30 Units Subcutaneous Daily Jacquelynn Cree, [place]   30 Units at 03/18/20 3491  . metFORMIN (GLUCOPHAGE-XR) 24 hr tablet 500 mg  500 mg Oral BID WC Jacquelynn Cree, PA-C   500 mg at 03/18/20 7915  . metoprolol tartrate (LOPRESSOR) tablet 50 mg  50 mg Oral BID Jacquelynn Cree, PA-C   50 mg at 03/18/20 0569  . multivitamin with minerals tablet 1 tablet  1 tablet Oral Daily Jacquelynn Cree, PA-C   1 tablet at 03/18/20 7948  . ondansetron (ZOFRAN) tablet 4 mg  4 mg Oral Q6H PRN Love, Pamela S, PA-C       Or  . ondansetron (ZOFRAN) injection 4 mg  4 mg Intravenous Q6H PRN Love, Pamela S, PA-C      . pantoprazole (PROTONIX) EC tablet 40 mg  40 mg Oral Daily Jacquelynn Cree, PA-C   40 mg at 03/18/20 0165  . polyethylene glycol (MIRALAX / GLYCOLAX) packet 17 g  17 g Oral Daily PRN Jacquelynn Cree, PA-C   17 g at 03/17/20 1753  . QUEtiapine (SEROQUEL) tablet 100 mg  100 mg Oral QHS Jacquelynn Cree, PA-C   100 mg at 03/17/20 2004  . QUEtiapine (SEROQUEL) tablet 50 mg  50 mg Oral q morning - 10a Marcello Fennel, MD   50 mg at 03/18/20 5374  . sodium phosphate (FLEET) 7-19 GM/118ML enema 1 enema  1 enema Rectal Once PRN Love, Pamela S, PA-C      . traZODone (DESYREL) tablet 25-50 mg  25-50 mg Oral QHS PRN Jacquelynn Cree, PA-C        Musculoskeletal: Strength & Muscle Tone: within normal  limits Gait & Station: normal Patient leans: N/A  Psychiatric Specialty Exam: Physical Exam Psychiatric:        Mood and Affect: Mood normal.        Behavior: Behavior normal.        Thought Content: Thought content normal.        Judgment: Judgment normal.     Review of Systems  Psychiatric/Behavioral: Positive for decreased concentration. Negative for agitation, behavioral problems, confusion, dysphoric mood, hallucinations, self-injury, sleep disturbance and suicidal ideas. The patient is not nervous/anxious and is not hyperactive.     Blood pressure 117/81, pulse 82, temperature 98.3 F (36.8  C), resp. rate 19, height 5\' 10"  (1.778 m), weight 90 kg, SpO2 96 %.Body mass index is 28.47 kg/m.  General Appearance: Fairly Groomed  Eye Contact:  Good  Speech:  Clear and Coherent and Normal Rate  Volume:  Normal  Mood:  Fine  Affect:  Flat  Thought Process:  Coherent, Linear and Descriptions of Associations: Intact  Orientation:  Full (Time, Place, and Person)  Thought Content:  Logical  Suicidal Thoughts:  No  Homicidal Thoughts:  No  Memory:  Immediate;   Fair Recent;   Poor  Judgement:  NA  Insight:  Shallow  Psychomotor Activity:  NA  Concentration:  Concentration: Fair and Attention Span: Fair  Recall:  [employer] of Knowledge:  Fair  Language:  Fair  Akathisia:  No  Handed:  Right  AIMS (if indicated):     Assets:  Communication Skills Desire for Improvement Physical Health Resilience Social Support  ADL's:  NA  Cognition:  NA  Sleep:      Bryan [name] is a 45 year old male with diagnosis of schizophrenia, who presented to the emergency room on November 29 in DKA, subsequently found apneic and in PEA.  Psychiatric consult was placed for medication management, reduce Seroquel and add Ritalin for activation and attention.  At this time patient is stable and currently receiving inpatient rehabilitation for his cognitive deficits.  Patient with known schizophrenia, which has been difficult to treat in the past due to noncompliance requiring multiple psychotropic medications for management.  Patient is currently stable denying any positive or negative symptoms of schizophrenia, on Seroquel 50 mg p.o. every morning and Seroquel 100 mg p.o. nightly.  Considering patient's psychiatric history it is recommended we continue some form of psychotropic medication to manage his schizophrenia at this time.  If necessary we can reduce the morning dose to 25 mg, however patient has previously been on larger doses.  Patient may be exhibiting some cognitive impairments and difficulty in  executive function, however this may be his baseline. Patient with known negative symptoms of avolition, flat affect,withdrawn, anhedonia.  At this time do not recommend any psychotropic stimulants such as Ritalin for management of attention and activation, as it is has been known to activate schizophrenic symptoms.  In terms of apathy, activation, and attention, may consider an activating antidepressant such as fluoxetine to help improve emotional and behavioral deficits in individuals with anoxic brain injuries.  Treatment Plan Summary: Plan Continue current medications. Will start fluoxetine 10mg  po daily x 1 dose. Followed by fluoxeting 20mg  po daily.   -Recommend working closely with SW for outpatient behavioral health referral to ACT team, once patient is medically stable to discharge home.   Disposition: No evidence of imminent risk to self or others at present.   Patient does not meet criteria for psychiatric inpatient admission. Refer to IOP. Discussed crisis  plan, support from social network, calling 911, coming to the Emergency Department, and calling Suicide Hotline.  Maryagnes Amos, FNP 03/18/2020 1:10 PM

## 2020-03-18 NOTE — Progress Notes (Signed)
Physical Therapy Session Note  Patient Details  Name: Bryan [name] MRN: [id number] Date of Birth: January 03, 1976  Today's Date: 03/18/2020 PT Individual Time: 0800-0855 PT Individual Time Calculation (min): 55 min   Short Term Goals: Week 1:  PT Short Term Goal 1 (Week 1): pt will perform all bed mobility with min A overall PT Short Term Goal 2 (Week 1): pt will transfer sit<>stand with LRAD and min A PT Short Term Goal 3 (Week 1): Pt will initiate gait training with LRAD  Skilled Therapeutic Interventions/Progress Updates:   Received pt supine in bed asleep, pt woke to verbal stimuli and agreeable to therapy, and denied any pain during session. Pt reported sleeping well last night but when therapist suggested getting OOB pt stated "not yet, I'm tired". Educated pt on importance and benefits of OOB mobility and pt agreed. Session with emphasis on functional mobility/transfers, generalized strengthening, dynamic standing balance/coordination, NMR, motor control/sequencing, and improved activity tolerance. Pt transferred supine<>sitting EOB with mod A and donned socks and shoes sitting EOB with max A for time management purposes. Pt transferred bed<>WC stand<>pivot with RW and max A with max cues for scooting, hand placement, turning technique, sequencing, and RW management. Noted pt with significant ataxia BLE>BUE and with increased difficulty following instructions and locating RW (potentially due to visual deficits). MD present for morning rounds and pt transported to dayroom in [facility] total A for time management purposes. Pt transferred WC<>mat stand<>pivot with RW and mod A with same technique mentioned above. Pt transferred sit<>stand with RW and mod A from elevated mat x 3 trials with mod cues for scooting to get feet flat on floor and for hand placement on RW and mat when standing. Pt performed the following exercises standing with BUE support on RW and mod A for balance: -alternating marches x10 and  x12 (decreased coordination and required mod A from therapist to keep RW still) -mini squats x8 (increase in BLE ataxia and difficulty controlling squat) Worked on reciprocal scooting on mat with mod/max cues and max A overall from therapist. Pt with increased difficulty sequencing and coordinating scooting and leaning. Stand<>pivot mat<>WC with RW and mod A with max cues for turning technique, foot placement inside RW, RW management, and stepping technique. Pt performed seated  BLE strengthening on Kinetron at 50 cm/sec for 2x20 reps with supervision with pt counting number of reps. Pt transported back to room in [facility] total A. Concluded session with pt sitting in WC, needs within reach, and seatbelt alarm on.   Therapy Documentation Precautions:  Precautions Precautions: Fall Required Braces or Orthoses: Other Brace Other Brace: L wrist cock up splint to be worn during day per RN - per Actue care chart (did not have during CIR eval) Restrictions Weight Bearing Restrictions: No   Therapy/Group: Individual Therapy Martin Majestic PT, DPT   03/18/2020, 7:35 AM

## 2020-03-18 NOTE — Progress Notes (Signed)
[facility] PHYSICAL MEDICINE & REHABILITATION PROGRESS NOTE  Subjective/Complaints: Patient seen sitting up in his chair this AM.  He states he slept well overnight. No reported issues overnight.  He is working with therapies.   ROS: Unreliable due to cognition.   Objective: Vital Signs: Blood pressure 117/81, pulse 82, temperature 98.3 F (36.8 C), resp. rate 19, height 5\' 10"  (1.778 m), weight 90 kg, SpO2 96 %. No results found. Recent Labs    03/16/20 0701  WBC 5.4  HGB 13.0  HCT 40.9  PLT 385   Recent Labs    03/16/20 0701 03/18/20 0516  NA 136 134*  K 4.1 4.2  CL 101 98  CO2 26 24  GLUCOSE 109* 117*  BUN 12 16  CREATININE 1.01 0.94  CALCIUM 10.4* 10.1    Intake/Output Summary (Last 24 hours) at 03/18/2020 03/20/2020 Last data filed at 03/17/2020 2330 Gross per 24 hour  Intake 240 ml  Output 800 ml  Net -560 ml     Pressure Injury 02/23/20 Heel Left Deep Tissue Pressure Injury - Purple or maroon localized area of discolored intact skin or blood-filled blister due to damage of underlying soft tissue from pressure and/or shear. (Active)  02/23/20 0800  Location: Heel  Location Orientation: Left  Staging: Deep Tissue Pressure Injury - Purple or maroon localized area of discolored intact skin or blood-filled blister due to damage of underlying soft tissue from pressure and/or shear.  Wound Description (Comments):   Present on Admission: No    Physical Exam: BP 117/81 (BP Location: Left Arm)    Pulse 82    Temp 98.3 F (36.8 C)    Resp 19    Ht 5\' 10"  (1.778 m)    Wt 90 kg    SpO2 96%    BMI 28.47 kg/m  Constitutional: No distress . Vital signs reviewed. HENT: Normocephalic.  Atraumatic. Eyes: EOMI. No discharge. Cardiovascular: No JVD.  RRR. Respiratory: Normal effort.  No stridor.  Bilateral clear to auscultation. GI: Non-distended.  BS +. Skin: Warm and dry.   Left earlobe wound healing Psych: Slowed. Confused. Delayed. Musc: No edema in extremities.  No  tenderness in extremities. Neuro: Alert and oriented x2 Motor: 4-/5 throughout with apraxia, unchanged Poor attention/concentration  Assessment/Plan: 1. Functional deficits which require 3+ hours per day of interdisciplinary therapy in a comprehensive inpatient rehab setting.  Physiatrist is providing close team supervision and 24 hour management of active medical problems listed below.  Physiatrist and rehab team continue to assess barriers to discharge/monitor patient progress toward functional and medical goals   Care Tool:  Bathing    Body parts bathed by patient: Chest,Face,Abdomen,Front perineal area,Right upper leg,Left upper leg   Body parts bathed by helper: Right arm,Left arm,Abdomen,Front perineal area,Buttocks,Right upper leg,Left upper leg,Right lower leg,Left lower leg,Face     Bathing assist Assist Level: Maximal Assistance - Patient 24 - 49%     Upper Body Dressing/Undressing Upper body dressing   What is the patient wearing?: Pull over shirt    Upper body assist Assist Level: Maximal Assistance - Patient 25 - 49%    Lower Body Dressing/Undressing Lower body dressing      What is the patient wearing?: Pants,Incontinence brief     Lower body assist Assist for lower body dressing: Total Assistance - Patient < 25%     Toileting Toileting    Toileting assist Assist for toileting: Dependent - Patient 0%     Transfers Chair/bed transfer  Transfers assist  Chair/bed transfer assist level: Maximal Assistance - Patient 25 - 49%     Locomotion Ambulation   Ambulation assist   Ambulation activity did not occur: Safety/medical concerns (fatigue, poor motor control/planning, weakness)          Walk 10 feet activity   Assist  Walk 10 feet activity did not occur: Safety/medical concerns (fatigue, poor motor control/planning, weakness)        Walk 50 feet activity   Assist Walk 50 feet with 2 turns activity did not occur:  Safety/medical concerns (fatigue, poor motor control/planning, weakness)         Walk 150 feet activity   Assist Walk 150 feet activity did not occur: Safety/medical concerns (fatigue, poor motor control/planning, weakness)         Walk 10 feet on uneven surface  activity   Assist Walk 10 feet on uneven surfaces activity did not occur: Safety/medical concerns (fatigue, poor motor control/planning, weakness)         Wheelchair     Assist Will patient use wheelchair at discharge?: Yes Type of Wheelchair: Manual    Wheelchair assist level: Moderate Assistance - Patient 50 - 74% Max wheelchair distance: 54ft    Wheelchair 50 feet with 2 turns activity    Assist        Assist Level: Maximal Assistance - Patient 25 - 49%   Wheelchair 150 feet activity     Assist      Assist Level: Total Assistance - Patient < 25%    Medical Problem List and Plan: 1.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 Antithrombotics: -DVT/anticoagulation:Pharmaceutical:Lovenox -antiplatelet therapy: N/A 3. Pain Management:N/A 4. Mood:LCSW to follow for evaluation and support. -antipsychotic agents: See #11. 5. Neuropsych: This patientis notcapable of making decisions onhisown behalf. 6. Skin/Wound Care:Routine pressure relief measures. 7. Fluids/Electrolytes/Nutrition:Monitor I/Os.  8. T2DM with hyperglycemia: New diagnosis with A1C-7.1 on 1/19.  Carb modified diet.   Continue Levemir with metformin and meal coverage.   Use SSI for elevated BS  Slightly elevated on 1/21  Monitor with increased mobility. 9.Acute blood loss anemia:Resolved  Hemoglobin 13.0 on 1/19 10. Elevated Triglycerides: 294 on 1/19 11. Psychosis d/o Schizophrenia. Last documented admission 11/20 (history of non-compliance)  Currently off Abiligy, Cogentin and Risperdal during hospitalization.    Continue Seroquel 100 nightly  Will trial decreasing Seroquel to 50 daily on 1/20 to improve daytime alertness, monitor Sleep wake chart.  Will consider psych consult for possibility of stimulant 12.  Elevated Creatinine   Creatinine 0.94 on 1/21  Continue to monitor 13. Hyponatremia  Na 134 on 1/21  Continue to monitor  LOS: 3 days A FACE TO FACE EVALUATION WAS PERFORMED  Ivory Bail Karis Juba 03/18/2020, 9:24 AM

## 2020-03-18 NOTE — Progress Notes (Signed)
Speech Language Pathology Daily Session Note  Patient Details  Name: Bryan [name] MRN: [id number] Date of Birth: 03/10/1975  Today's Date: 03/18/2020 SLP Individual Time: 1400-1500 SLP Individual Time Calculation (min): 60 min  Short Term Goals: Week 1: SLP Short Term Goal 1 (Week 1): Patient will orient to date/approximate time with modA cues for using visual/orientation aides. SLP Short Term Goal 2 (Week 1): Patient will recall at least 3 different tasks completed during day (in therapy/nursing care) with modA cues SLP Short Term Goal 3 (Week 1): Patient will perform basic level ADL's (feeding-self during meals, etc) and problem solving tasks with modA cues. SLP Short Term Goal 4 (Week 1): Patient will initiate during structured conversation and/or simulated tasks with mod-maxA cues. SLP Short Term Goal 5 (Week 1): Patient will demonstrate awareness to basic level errors during functional and simulated ADL tasks with modA  Skilled Therapeutic Interventions:   Patient seen for skilled ST session focusing on cognitive-linguistic goals. Patient was lying in bed but agreeable to therapy session. He was able to complete bed to [facility] transfer with Antony Salmon and SLP providing verbal cues and min-mod physical assist. Patient participated in attention task same as previous date ([employer] operational [employer] program for cognitive tasks (BITS) but he reported his arms felt more stiff today and he did seem to have some more difficulty with moving arms upwards. Patient then participated in learning new task of matching picture cards with three categories (color, shape, number; Blink game). He required modA cues and extended time to process but was able to improve to min-modA with repeated trials. Patient continues to benefit from skilled SLP intervention to maximize cognitive-linguistic abilities prior to discharge.  Pain Pain Assessment Pain Scale: 0-10 Pain Score: 0-No pain  Therapy/Group: Individual  Therapy  Angela Nevin, MA, CCC-SLP Speech Therapy

## 2020-03-18 NOTE — Progress Notes (Signed)
Occupational Therapy Session Note  Patient Details  Name: Bryan [name] MRN: [id number] Date of Birth: 03-18-1975  Today's Date: 03/18/2020 OT Individual Time: 3976-7341 OT Individual Time Calculation (min): 60 min    Short Term Goals: Week 1:  OT Short Term Goal 1 (Week 1): Pt will complete bathing with mod assist OT Short Term Goal 2 (Week 1): Pt will complete UB dressing with mod assist OT Short Term Goal 3 (Week 1): Pt will complete LB dressing with max assist of one caregiver at sit > stand level OT Short Term Goal 4 (Week 1): Pt will complete toilet transfer with max assist of one caregiver  Skilled Therapeutic Interventions/Progress Updates:    Treatment session with focus on BUE strengthening, coordination, and functional use.  Pt received upright in w/c reporting "I was just trying to get to bed".  Pt with seat belt alarm on and intact, however was reaching towards bed.  Pt agreeable to therapy session as long as he could return to bed at the end.  Engaged in table top activity with focus on functional reach, grasp, manipulation, and bringing items to mouth with BUE as needed for self-care tasks and self-feeding.  Due to impaired vision pt with increased difficulty reaching for items, therefore requiring hand over hand ~50% of time and cues for visual scanning and head turns to increase ability to locate items.  Pt reports fatigue and requesting to transfer back to bed.  Pt completed sit > stand with mod assist, despite cues to complete squat pivot.  Pt unable to motor plan weight shifting or stepping for stand pivot, therefore returned to sitting. Pt completed lateral scoot/squat pivot transfer with max assist.  Therapist lifted BLE in to bed during transition to supine.  Pt remained supine in bed with all needs in reach.  Therapy Documentation Precautions:  Precautions Precautions: Fall Required Braces or Orthoses: Other Brace Other Brace: L wrist cock up splint to be worn during  day per RN - per [facility] care chart (did not have during CIR eval) Restrictions Weight Bearing Restrictions: No Pain: Pain Assessment Pain Scale: 0-10 Pain Score: 4  Pain Type: Acute pain Pain Location: Hip Pain Frequency: Occasional Pain Onset: On-going Patients Stated Pain Goal: 2 Pain Intervention(s): Medication (See eMAR)   Therapy/Group: Individual Therapy  Rosalio Loud 03/18/2020, 12:15 PM

## 2020-03-19 ENCOUNTER — Inpatient Hospital Stay (HOSPITAL_COMMUNITY): Payer: Medicaid Other

## 2020-03-19 ENCOUNTER — Inpatient Hospital Stay (HOSPITAL_COMMUNITY): Payer: Medicaid Other | Admitting: Physical Therapy

## 2020-03-19 ENCOUNTER — Inpatient Hospital Stay (HOSPITAL_COMMUNITY): Payer: Medicaid Other | Admitting: Occupational Therapy

## 2020-03-19 LAB — GLUCOSE, CAPILLARY
Glucose-Capillary: 101 mg/dL — ABNORMAL HIGH (ref 70–99)
Glucose-Capillary: 104 mg/dL — ABNORMAL HIGH (ref 70–99)
Glucose-Capillary: 113 mg/dL — ABNORMAL HIGH (ref 70–99)
Glucose-Capillary: 115 mg/dL — ABNORMAL HIGH (ref 70–99)
Glucose-Capillary: 120 mg/dL — ABNORMAL HIGH (ref 70–99)
Glucose-Capillary: 77 mg/dL (ref 70–99)

## 2020-03-19 NOTE — Progress Notes (Signed)
PHYSICAL MEDICINE & REHABILITATION PROGRESS NOTE  Subjective/Complaints:   Pt reports LBM yesterday- not sleeping well due to roommate- Doing OK.    ROS: limited due to cognition.   Objective: Vital Signs: Blood pressure 114/83, pulse 79, temperature 97.9 F (36.6 C), temperature source Oral, resp. rate 18, height 5\' 10"  (1.778 m), weight 90 kg, SpO2 98 %. No results found. No results for input(s): WBC, HGB, HCT, PLT in the last 72 hours. Recent Labs    03/18/20 0516  NA 134*  K 4.2  CL 98  CO2 24  GLUCOSE 117*  BUN 16  CREATININE 0.94  CALCIUM 10.1    Intake/Output Summary (Last 24 hours) at 03/19/2020 1426 Last data filed at 03/19/2020 0900 Gross per 24 hour  Intake 360 ml  Output 300 ml  Net 60 ml     Pressure Injury 02/23/20 Heel Left Deep Tissue Pressure Injury - Purple or maroon localized area of discolored intact skin or blood-filled blister due to damage of underlying soft tissue from pressure and/or shear. (Active)  02/23/20 0800  Location: Heel  Location Orientation: Left  Staging: Deep Tissue Pressure Injury - Purple or maroon localized area of discolored intact skin or blood-filled blister due to damage of underlying soft tissue from pressure and/or shear.  Wound Description (Comments):   Present on Admission: No    Physical Exam: BP 114/83 (BP Location: Left Arm)   Pulse 79   Temp 97.9 F (36.6 C) (Oral)   Resp 18   Ht 5\' 10"  (1.778 m)   Wt 90 kg   SpO2 98%   BMI 28.47 kg/m  Constitutional: No distress . Vital signs reviewed. Laying on side, getting dressed by OT, NAD HENT: Normocephalic.  Atraumatic. Eyes: EOMI. No discharge. Kept eyes closed entire interview Cardiovascular: RRR Respiratory: CTA B/L- no W/R/R- good air movement GI: Soft, NT, ND, (+)BS  Skin: Warm and dry.   Left earlobe wound healing- much better Psych: Slowed. Confused. Delayed. Musc: No edema in extremities.  No tenderness in extremities. Neuro: Alert and  oriented x2 Motor: 4-/5 throughout with apraxia, unchanged Poor attention/concentration  Assessment/Plan: 1. Functional deficits which require 3+ hours per day of interdisciplinary therapy in a comprehensive inpatient rehab setting.  Physiatrist is providing close team supervision and 24 hour management of active medical problems listed below.  Physiatrist and rehab team continue to assess barriers to discharge/monitor patient progress toward functional and medical goals   Care Tool:  Bathing    Body parts bathed by patient: Chest,Face,Right upper leg,Left upper leg   Body parts bathed by helper: Right arm,Left arm,Front perineal area,Buttocks,Right lower leg,Left lower leg,Face     Bathing assist Assist Level: Maximal Assistance - Patient 24 - 49%     Upper Body Dressing/Undressing Upper body dressing   What is the patient wearing?: Pull over shirt    Upper body assist Assist Level: Maximal Assistance - Patient 25 - 49%    Lower Body Dressing/Undressing Lower body dressing      What is the patient wearing?: Pants,Incontinence brief     Lower body assist Assist for lower body dressing: Maximal Assistance - Patient 25 - 49%     Toileting Toileting    Toileting assist Assist for toileting: Dependent - Patient 0%     Transfers Chair/bed transfer  Transfers assist     Chair/bed transfer assist level: Maximal Assistance - Patient 25 - 49%     Locomotion Ambulation   Ambulation assist  Ambulation activity did not occur: Safety/medical concerns (fatigue, poor motor control/planning, weakness)          Walk 10 feet activity   Assist  Walk 10 feet activity did not occur: Safety/medical concerns (fatigue, poor motor control/planning, weakness)        Walk 50 feet activity   Assist Walk 50 feet with 2 turns activity did not occur: Safety/medical concerns (fatigue, poor motor control/planning, weakness)         Walk 150 feet  activity   Assist Walk 150 feet activity did not occur: Safety/medical concerns (fatigue, poor motor control/planning, weakness)         Walk 10 feet on uneven surface  activity   Assist Walk 10 feet on uneven surfaces activity did not occur: Safety/medical concerns (fatigue, poor motor control/planning, weakness)         Wheelchair     Assist Will patient use wheelchair at discharge?: Yes Type of Wheelchair: Manual    Wheelchair assist level: Moderate Assistance - Patient 50 - 74% Max wheelchair distance: 15ft    Wheelchair 50 feet with 2 turns activity    Assist        Assist Level: Maximal Assistance - Patient 25 - 49%   Wheelchair 150 feet activity     Assist      Assist Level: Total Assistance - Patient < 25%    Medical Problem List and Plan: 1.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 Antithrombotics: -DVT/anticoagulation:Pharmaceutical:Lovenox -antiplatelet therapy: N/A 3. Pain Management:N/A 4. Mood:LCSW to follow for evaluation and support. -antipsychotic agents: See #11. 5. Neuropsych: This patientis notcapable of making decisions onhisown behalf. 6. Skin/Wound Care:Routine pressure relief measures. 7. Fluids/Electrolytes/Nutrition:Monitor I/Os.  8. T2DM with hyperglycemia: New diagnosis with A1C-7.1 on 1/19.  Carb modified diet.   Continue Levemir with metformin and meal coverage.   Use SSI for elevated BS  Slightly elevated on 1/21  1/22- BGs 109-113- doing great- con't regimen  Monitor with increased mobility. 9.Acute blood loss anemia:Resolved  Hemoglobin 13.0 on 1/19 10. Elevated Triglycerides: 294 on 1/19 11. Psychosis d/o Schizophrenia. Last documented admission 11/20 (history of non-compliance)  Currently off Abiligy, Cogentin and Risperdal during hospitalization.   Continue Seroquel 100 nightly  Will trial decreasing  Seroquel to 50 daily on 1/20 to improve daytime alertness, monitor Sleep wake chart.  Will consider psych consult for possibility of stimulant  1/22- not sleeping well per nursing notes due to roommate up a lot.  12.  Elevated Creatinine   Creatinine 0.94 on 1/21  Continue to monitor 13. Hyponatremia  Na 134 on 1/21  Continue to monitor  LOS: 4 days A FACE TO FACE EVALUATION WAS PERFORMED  Cordera Stineman 03/19/2020, 2:26 PM

## 2020-03-19 NOTE — Progress Notes (Signed)
Occupational Therapy Session Note  Patient Details  Name: Bryan [name] MRN: [id number] Date of Birth: 1975-08-18  Today's Date: 03/19/2020 OT Individual Time: 4742-5956 OT Individual Time Calculation (min): 57 min   Short Term Goals: Week 1:  OT Short Term Goal 1 (Week 1): Pt will complete bathing with mod assist OT Short Term Goal 2 (Week 1): Pt will complete UB dressing with mod assist OT Short Term Goal 3 (Week 1): Pt will complete LB dressing with max assist of one caregiver at sit > stand level OT Short Term Goal 4 (Week 1): Pt will complete toilet transfer with max assist of one caregiver  Skilled Therapeutic Interventions/Progress Updates:    Pt greeted in the bed with no c/o pain, just finished eating with SLP. He was agreeable to engage in bathing/dressing tasks during session, starting with UB self care sitting EOB. Max A for supine<sit. Worked on sitting balance, motor planning, and functional UE use at this time, pt needing mod facilitation to wash/dress UB due to proximal>distal weakness bilaterally. Vcs for sequencing and Min A-CGA for balance with tactile cues to improve forward weight shifting, pt with posterior lean/LOB. Returned to supine to wash/dress LB. Pt rolling Rt>Lt with Mod A and facilitation to place hands onto bedrails, mod HOH to wash his upper legs. Increased time required for following all instruction. Max-Total A for LB dressing overall. Pt able to boost himself up in bed using feet with Min A. Pt remained in bed with all needs within reach and bed alarm set.   Therapy Documentation Precautions:  Precautions Precautions: Fall Required Braces or Orthoses: Other Brace Other Brace: L wrist cock up splint to be worn during day per RN - per [facility] care chart (did not have during CIR eval) Restrictions Weight Bearing Restrictions: No Vital Signs: Therapy Vitals Pulse Rate: 86 BP: 120/79 Pain: pt with one instance of Rt LE pain during bed mobility. Pt declined  asking RN for pain medicine as pain had absolved after this 1 instance Pain Assessment Pain Scale: 0-10 Pain Score: 0-No pain ADL: ADL Eating: Moderate assistance Where Assessed-Eating: Bed level Grooming: Moderate assistance Where Assessed-Grooming: Edge of bed Upper Body Bathing: Maximal assistance Where Assessed-Upper Body Bathing: Edge of bed Lower Body Bathing: Dependent Where Assessed-Lower Body Bathing: Bed level Where Assessed-Upper Body Dressing: Edge of bed Lower Body Dressing: Dependent Where Assessed-Lower Body Dressing: Bed level Toileting: Dependent Toilet Transfer: Not assessed Tub/Shower Transfer: Not assessed      Therapy/Group: Individual Therapy  Treniya Lobb A Demetrica Zipp 03/19/2020, 12:16 PM

## 2020-03-19 NOTE — Progress Notes (Signed)
Speech Language Pathology Daily Session Note  Patient Details  Name: Bryan [name] MRN: [id number] Date of Birth: 07-Feb-1976  Today's Date: 03/19/2020 SLP Individual Time: 0800-0857 SLP Individual Time Calculation (min): 57 min  Short Term Goals: Week 1: SLP Short Term Goal 1 (Week 1): Patient will orient to date/approximate time with modA cues for using visual/orientation aides. SLP Short Term Goal 2 (Week 1): Patient will recall at least 3 different tasks completed during day (in therapy/nursing care) with modA cues SLP Short Term Goal 3 (Week 1): Patient will perform basic level ADL's (feeding-self during meals, etc) and problem solving tasks with modA cues. SLP Short Term Goal 4 (Week 1): Patient will initiate during structured conversation and/or simulated tasks with mod-maxA cues. SLP Short Term Goal 5 (Week 1): Patient will demonstrate awareness to basic level errors during functional and simulated ADL tasks with modA  Skilled Therapeutic Interventions: Pt seen for skilled ST session focusing on cognitive-linguistic goals, pt continues with flat affect but pleasant and actively participating in all therapeutic tasks. Pt independently oriented to location and DOB, not oriented to age ("26"), reason for hospitalization, DOW, MOY or year. SLP providing calendar to increase understanding, recall and carryover of basic orientation. SLP facilitating simple problem solving task related to basic daily living by providing mod A verbal cues for initiation and critical thinking. Pt left in bed with alarm set and all needs within reach. Cont ST POC.  Pain Pain Assessment Pain Scale: 0-10 Pain Score: 0-No pain  Therapy/Group: Individual Therapy  Tacey Ruiz 03/19/2020, 8:55 AM

## 2020-03-19 NOTE — Progress Notes (Signed)
+/-   sleep, mainly related to roommate getting up and down frequently. Very flat affect. Decreased dexterity to hands. Bryan [name]

## 2020-03-19 NOTE — Progress Notes (Signed)
Physical Therapy Session Note  Patient Details  Name: Bryan [name] MRN: [id number] Date of Birth: August 06, 1975  Today's Date: 03/19/2020 PT Individual Time: 1100-1112 PT Individual Time Calculation (min): 12 min   Short Term Goals: Week 1:  PT Short Term Goal 1 (Week 1): pt will perform all bed mobility with min A overall PT Short Term Goal 2 (Week 1): pt will transfer sit<>stand with LRAD and min A PT Short Term Goal 3 (Week 1): Pt will initiate gait training with LRAD  Skilled Therapeutic Interventions/Progress Updates:    pt received in bed sleeping soundly, PT attempted to wake gently with name of pt however pt did not awake, with sternal rub and louder attempt pt opened eyes. PT asked pt if he was ok he slowly said "yes, just tired". PT attempted to increased pt alertness with directing pt to sit EOB however pt reported, "no I need to rest". PT directed in pt in attempting to sit EOB additional time and pt adamantly declined and politely requested to "stay here, please". PT asked pt if there were other needs and he said, "yes, I'm not comfortable", PT asked if he needed assistance to scoot upward and he said yes, PT directed pt in upward scooting and one roll to pt's Rt side min A. Pt then reported he was comfort and pt left in supine, alarm set, All needs in reach and in good condition. Call light in hand.  Pt missed 48 mins of PT.  Therapy Documentation Precautions:  Precautions Precautions: Fall Required Braces or Orthoses: Other Brace Other Brace: L wrist cock up splint to be worn during day per RN - per [facility] care chart (did not have during CIR eval) Restrictions Weight Bearing Restrictions: No General: PT Amount of Missed Time (min): 48 Minutes PT Missed Treatment Reason: Patient fatigue Vital Signs: Therapy Vitals Pulse Rate: 86 BP: 120/79 Pain: Pain Assessment Pain Scale: 0-10 Pain Score: 0-No pain Mobility:   Locomotion :    Trunk/Postural Assessment :     Balance:   Exercises:   Other Treatments:      Therapy/Group: Individual Therapy  Barbaraann Faster 03/19/2020, 11:24 AM

## 2020-03-20 LAB — GLUCOSE, CAPILLARY
Glucose-Capillary: 99 mg/dL (ref 70–99)
Glucose-Capillary: 96 mg/dL (ref 70–99)
Glucose-Capillary: 91 mg/dL (ref 70–99)
Glucose-Capillary: 104 mg/dL — ABNORMAL HIGH (ref 70–99)

## 2020-03-20 NOTE — Progress Notes (Signed)
Slept better so far tonight. Does not call for assistance. Will answer questions when asked, but doesn't initiate conversation. Affect flat. Left lateral heel with old, popped blister. Bilateral prevalon boots applied at HS. Poor intake at night, drank 120cc's water, so far this shift.  Bryan [name] A

## 2020-03-21 ENCOUNTER — Inpatient Hospital Stay (HOSPITAL_COMMUNITY): Payer: Medicaid Other | Admitting: Occupational Therapy

## 2020-03-21 ENCOUNTER — Inpatient Hospital Stay (HOSPITAL_COMMUNITY): Payer: Medicaid Other

## 2020-03-21 DIAGNOSIS — D62 Acute posthemorrhagic anemia: Secondary | ICD-10-CM

## 2020-03-21 LAB — GLUCOSE, CAPILLARY
Glucose-Capillary: 86 mg/dL (ref 70–99)
Glucose-Capillary: 107 mg/dL — ABNORMAL HIGH (ref 70–99)
Glucose-Capillary: 93 mg/dL (ref 70–99)
Glucose-Capillary: 94 mg/dL (ref 70–99)

## 2020-03-21 LAB — BASIC METABOLIC PANEL
Anion gap: 8 (ref 5–15)
BUN: 11 mg/dL (ref 6–20)
CO2: 23 mmol/L (ref 22–32)
Calcium: 10.1 mg/dL (ref 8.9–10.3)
Chloride: 102 mmol/L (ref 98–111)
Creatinine, Ser: 0.87 mg/dL (ref 0.61–1.24)
GFR, Estimated: >60 mL/min (ref >60)
Glucose, Bld: 99 mg/dL (ref 70–99)
Potassium: 4 mmol/L (ref 3.5–5.1)
Sodium: 133 mmol/L — ABNORMAL LOW (ref 135–145)

## 2020-03-21 LAB — CBC
HCT: 38 % — ABNORMAL LOW (ref 39.0–52.0)
Hemoglobin: 12.5 g/dL — ABNORMAL LOW (ref 13.0–17.0)
MCH: 30.3 pg (ref 26.0–34.0)
MCHC: 32.9 g/dL (ref 30.0–36.0)
MCV: 92.2 fL (ref 80.0–100.0)
Platelets: 323 10*3/uL (ref 150–400)
RBC: 4.12 MIL/uL — ABNORMAL LOW (ref 4.22–5.81)
RDW: 14.9 % (ref 11.5–15.5)
WBC: 4.8 10*3/uL (ref 4.0–10.5)
nRBC: 0 % (ref 0.0–0.2)

## 2020-03-21 NOTE — Progress Notes (Signed)
[place] PHYSICAL MEDICINE & REHABILITATION PROGRESS NOTE  Subjective/Complaints: Patient seen laying in bed this morning.  He states he slept well overnight.  Slept fair per nursing.   ROS: limited due to cognition.   Objective: Vital Signs: Blood pressure 119/80, pulse 88, temperature 97.9 F (36.6 C), resp. rate 18, height 5\' 10"  (1.778 m), weight 91 kg, SpO2 94 %. No results found. Recent Labs    03/21/20 0526  WBC 4.8  HGB 12.5*  HCT 38.0*  PLT 323   Recent Labs    03/21/20 0526  NA 133*  K 4.0  CL 102  CO2 23  GLUCOSE 99  BUN 11  CREATININE 0.87  CALCIUM 10.1    Intake/Output Summary (Last 24 hours) at 03/21/2020 1255 Last data filed at 03/21/2020 0700 Gross per 24 hour  Intake 720 ml  Output -  Net 720 ml     Pressure Injury 02/23/20 Heel Left Deep Tissue Pressure Injury - Purple or maroon localized area of discolored intact skin or blood-filled blister due to damage of underlying soft tissue from pressure and/or shear. (Active)  02/23/20 0800  Location: Heel  Location Orientation: Left  Staging: Deep Tissue Pressure Injury - Purple or maroon localized area of discolored intact skin or blood-filled blister due to damage of underlying soft tissue from pressure and/or shear.  Wound Description (Comments):   Present on Admission: No    Physical Exam: BP 119/80   Pulse 88   Temp 97.9 F (36.6 C)   Resp 18   Ht 5\' 10"  (1.778 m)   Wt 91 kg   SpO2 94%   BMI 28.79 kg/m  Constitutional: No distress . Vital signs reviewed. HENT: Normocephalic.  Atraumatic. Eyes: EOMI. No discharge. Cardiovascular: No JVD.  RRR. Respiratory: Normal effort.  No stridor.  Bilateral clear to auscultation. GI: Non-distended.  BS +. Skin: Warm and dry.   Left earlobe with healing wound Psych: Slowed.  Confused.  Normal behavior. Musc: No edema in extremities.  No tenderness in extremities. Neuro: Alert and oriented x2 Motor: 4-/5 throughout with apraxia, stable Poor  attention/concentration  Assessment/Plan: 1. Functional deficits which require 3+ hours per day of interdisciplinary therapy in a comprehensive inpatient rehab setting.  Physiatrist is providing close team supervision and 24 hour management of active medical problems listed below.  Physiatrist and rehab team continue to assess barriers to discharge/monitor patient progress toward functional and medical goals   Care Tool:  Bathing    Body parts bathed by patient: Chest,Face,Right upper leg,Left upper leg,Right arm,Left arm,Abdomen,Front perineal area   Body parts bathed by helper: Buttocks,Right lower leg,Left lower leg     Bathing assist Assist Level: Moderate Assistance - Patient 50 - 74%     Upper Body Dressing/Undressing Upper body dressing   What is the patient wearing?: Pull over shirt    Upper body assist Assist Level: Minimal Assistance - Patient > 75%    Lower Body Dressing/Undressing Lower body dressing      What is the patient wearing?: Underwear/pull up,Pants     Lower body assist Assist for lower body dressing: Maximal Assistance - Patient 25 - 49%     Toileting Toileting    Toileting assist Assist for toileting: Dependent - Patient 0%     Transfers Chair/bed transfer  Transfers assist     Chair/bed transfer assist level: Maximal Assistance - Patient 25 - 49%     Locomotion Ambulation   Ambulation assist   Ambulation activity did not  occur: Safety/medical concerns (fatigue, poor motor control/planning, weakness)          Walk 10 feet activity   Assist  Walk 10 feet activity did not occur: Safety/medical concerns (fatigue, poor motor control/planning, weakness)        Walk 50 feet activity   Assist Walk 50 feet with 2 turns activity did not occur: Safety/medical concerns (fatigue, poor motor control/planning, weakness)         Walk 150 feet activity   Assist Walk 150 feet activity did not occur: Safety/medical concerns  (fatigue, poor motor control/planning, weakness)         Walk 10 feet on uneven surface  activity   Assist Walk 10 feet on uneven surfaces activity did not occur: Safety/medical concerns (fatigue, poor motor control/planning, weakness)         Wheelchair     Assist Will patient use wheelchair at discharge?: Yes Type of Wheelchair: Manual    Wheelchair assist level: Moderate Assistance - Patient 50 - 74% Max wheelchair distance: 26ft    Wheelchair 50 feet with 2 turns activity    Assist        Assist Level: Maximal Assistance - Patient 25 - 49%   Wheelchair 150 feet activity     Assist      Assist Level: Total Assistance - Patient < 25%    Medical Problem List and Plan: 1.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 Antithrombotics: -DVT/anticoagulation:Pharmaceutical:Lovenox -antiplatelet therapy: N/A 3. Pain Management:N/A 4. Mood:LCSW to follow for evaluation and support. -antipsychotic agents: See #11. 5. Neuropsych: This patientis notcapable of making decisions onhisown behalf. 6. Skin/Wound Care:Routine pressure relief measures. 7. Fluids/Electrolytes/Nutrition:Monitor I/Os.  8. T2DM with hyperglycemia: New diagnosis with A1C-7.1 on 1/19.  Carb modified diet.   Continue Levemir with metformin and meal coverage.   Use SSI for elevated BS  Relatively controlled on 1/24  Monitor with increased mobility. 9.Acute blood loss anemia:Resolved  Hemoglobin 13.0 on 1/19 10. Elevated Triglycerides: 294 on 1/19 11. Psychosis d/o Schizophrenia. Last documented admission 11/20 (history of non-compliance)  Currently off Abiligy, Cogentin and Risperdal during hospitalization.   Continue Seroquel 100 nightly  Will trial decreasing Seroquel to 50 daily on 1/20 to improve daytime alertness, monitor Sleep wake chart.  Appreciate psych  recs-started on fluoxetine 12.  Elevated Creatinine: Resolved  Continue to monitor 13. Hyponatremia  Na 133 on 1/24, monitor with addition of fluoxetine  Continue to monitor 14.  Acute blood loss anemia  Hemoglobin 12.5 on 1/24, continue to monitor  LOS: 6 days A FACE TO FACE EVALUATION WAS PERFORMED  Jaziya Obarr Karis Juba 03/21/2020, 12:55 PM

## 2020-03-21 NOTE — Progress Notes (Signed)
Occupational Therapy Session Note  Patient Details  Name: Bryan [name] MRN: [id number] Date of Birth: 11-19-1975  Today's Date: 03/21/2020 OT Individual Time: 0355-9741 OT Individual Time Calculation (min): 55 min    Short Term Goals: Week 1:  OT Short Term Goal 1 (Week 1): Pt will complete bathing with mod assist OT Short Term Goal 2 (Week 1): Pt will complete UB dressing with mod assist OT Short Term Goal 3 (Week 1): Pt will complete LB dressing with max assist of one caregiver at sit > stand level OT Short Term Goal 4 (Week 1): Pt will complete toilet transfer with max assist of one caregiver  Skilled Therapeutic Interventions/Progress Updates:    Treatment session with focus on self-care retraining, dynamic sitting balance, sit > stand, and increased participation in self-care tasks.  Pt received supine in bed agreeable to bathing and dressing from EOB.  Pt completed bed mobility with min-mod assist with cues for rolling and hand over hand assist to reach for bed rail, noted improvements in mobility with improved sequencing this session.  Engaged in UB bathing and dressing while seated EOB with CGA to Supervision for sitting balance.  Pt required setup for items due to decreased vision, able to wash UB with increased time and therapist providing support to lift RUE to allow pt to wash underarm.  Pt donned shirt with setup assist and cues for technique with pulling sleeves over elbows prior to pulling shirt over head.  Therapist assisted with pulling shirt over trunk.  Pt agreeable to attempting LB dressing at sit > stand level this session.  Completed sit > stand x4 with mod assist and cues for sequencing and weight shifting.  Pt able to maintain upright standing posture with LUE on bed rail while therapist assisted with washing buttocks and then when pulling underwear and pants over hips.  Pt able to thread LLE into boxer shorts with increased time but then required assistance for the  remainder of LB dressing.  Pt reports fatigue and requested to return to supine.  Therapist assisted with positioning BLE in to bed.  Pt able to boost up in bed with cues for pushing through BLE.  Pt remained semi-reclined in bed with all needs in reach.  Therapy Documentation Precautions:  Precautions Precautions: Fall Required Braces or Orthoses: Other Brace Other Brace: L wrist cock up splint to be worn during day per RN - per [facility] care chart (did not have during CIR eval) Restrictions Weight Bearing Restrictions: No Pain:  Pt with no c/o pain   Therapy/Group: Individual Therapy  Rosalio Loud 03/21/2020, 12:08 PM

## 2020-03-21 NOTE — Progress Notes (Signed)
Physical Therapy Session Note  Patient Details  Name: Bryan [name] MRN: [id number] Date of Birth: 1975-08-31  Today's Date: 03/21/2020 PT Individual Time: 4098-1191 PT Individual Time Calculation (min): 69 min   Short Term Goals: Week 1:  PT Short Term Goal 1 (Week 1): pt will perform all bed mobility with min A overall PT Short Term Goal 2 (Week 1): pt will transfer sit<>stand with LRAD and min A PT Short Term Goal 3 (Week 1): Pt will initiate gait training with LRAD  Skilled Therapeutic Interventions/Progress Updates:   Received pt supine in bed asleep, pt easily woken and agreeable to therapy, and denied any pain at rest but reported increased R hip pain with mobility; pt declined pain interventions multiple times throughout session. Session with emphasis on functional mobility/transfers, generalized strengthening, dynamic standing balance/coordinaiton, NMR, motor control/sequencing, ambulation, simulated car transfers, and improved activity tolerance. Pt continues to require max cues for technique, hand placement, and sequencing when standing with RW. Pt transferred supine<>sitting EOB with mod A and donned shoes sitting EOB with max A for time management purposes. Pt attempted to transfer sit<>stand with BUE support on RW however RW tipped back and pt with posterior LOB onto bed. Therapist asked pt if he knew why he tipped backwards, however pt was unaware. Educated pt on technique for standing including placing 1 UE on RW and 1 UE on bed and pt transferred sit<>stand from elevated bed with mod A and transferred stand<>pivot bed<>WC with RW and mod A with max cues for stepping tehcnique and turning. Pt transported to ortho gym in [facility] total A for energy conservation purposes and performed simulated car transfer with mod A with same cues mentioned previously. Pt ambulated 79ft with RW and mod A +2 for very close WC follow. Pt with significant BLE ataxia and difficulty keeping LLE inside RW. Pt  demonstrated overall unsteady gait pattern. Worked on dynamic standing balance tapping each LE to single cone x 1 rep bilaterally with BUE support on RW and mod A for balance. Pt with significant BLE ataxia and overshooting target of cone and ultimately unable to tap cone. Pt c/o increased R hip pain and returned to sitting to rest. Transitioned to seated alternating toe taps to cone 2x5 reps bilaterally. Pt denied any hip pain performing this seated and demonstrated improvements in coordination. Pt transferred sit<>stand with RW and min A and worked on Librarian, academic horseshoes with RUE and min A for balance x 2 trials. Pt with increased difficulty locating horseshoes hanging on RW and lining them up with target on floor. Pt initially not able to visually locate target; therefore moved it closer. Pt with 1 significant anterior LOB requiring max A to correct. Pt transferred mat<>WC with RW and mod A and transported back to room in [facility] total A. Stand<>pivot WC<>bed with mod A and sit<>supine with min A. Concluded session with pt sitting in bed, needs within reach, and bed alarm on. Therapist provided pt with fresh drink.   Therapy Documentation Precautions:  Precautions Precautions: Fall Required Braces or Orthoses: Other Brace Other Brace: L wrist cock up splint to be worn during day per RN - per [facility] care chart (did not have during CIR eval) Restrictions Weight Bearing Restrictions: No  Therapy/Group: Individual Therapy Martin Majestic PT, DPT   03/21/2020, 7:34 AM

## 2020-03-22 ENCOUNTER — Inpatient Hospital Stay (HOSPITAL_COMMUNITY): Payer: Medicaid Other | Admitting: Occupational Therapy

## 2020-03-22 ENCOUNTER — Inpatient Hospital Stay (HOSPITAL_COMMUNITY): Payer: Medicaid Other

## 2020-03-22 LAB — GLUCOSE, CAPILLARY
Glucose-Capillary: 103 mg/dL — ABNORMAL HIGH (ref 70–99)
Glucose-Capillary: 70 mg/dL (ref 70–99)
Glucose-Capillary: 72 mg/dL (ref 70–99)
Glucose-Capillary: 102 mg/dL — ABNORMAL HIGH (ref 70–99)

## 2020-03-22 NOTE — Progress Notes (Signed)
Patient's mom complained that he is having blurred vision, when asked patient said it gone and that some times he will have the blurred vision then it will go away. Patient educated to alert nursing staff when that happen again. We continue to monitor.

## 2020-03-22 NOTE — Progress Notes (Signed)
Physical Therapy Session Note  Patient Details  Name: Bryan [name] MRN: [id number] Date of Birth: 27-Nov-1975  Today's Date: 03/22/2020 PT Individual Time: 1100-1127 PT Individual Time Calculation (min): 27 min   Today's Date: 03/22/2020 PT Missed Time: 33 Minutes Missed Time Reason: Patient fatigue  Short Term Goals: Week 1:  PT Short Term Goal 1 (Week 1): pt will perform all bed mobility with min A overall PT Short Term Goal 2 (Week 1): pt will transfer sit<>stand with LRAD and min A PT Short Term Goal 3 (Week 1): Pt will initiate gait training with LRAD  Skilled Therapeutic Interventions/Progress Updates:   Received pt supine in bed asleep, pt easily aroused and reported fatigue and initially declined OOB mobility. Therapist asked if pt needed to use restroom and pt stated "no", however per OT and nursing pt with no recorded void occurrence today. Therapist suggested at least trying to get up and use restroom and pt agreed. Pt initially declined pain but reported increased R hip pain with bed mobility. Session with emphasis on functional mobility/transfers, generalized strengthening, dynamic standing balance/coordination, toileting, and improved activity tolerance. Pt transferred supine<>sitting EOB with HOB elevated and use of bedrails with CGA and cues for LE management. Pt transferred sit<>stand with RW and mod A from elevated bed with continued max cues for scooting to edge of bed and hand placement on bed and RW (as pt will attempt to pull up with both UEs on RW causing pt to tip posteriorly onto bed). Stand<>pivot bed<>WC with RW and mod A with mod cues for turning technique and RW management. Pt performed additional stand<>pivot WC<>toilet with bedside commode over top with RW and mod A and required total A for clothing management. Pt able to void (NT aware). Sit<>stand with mod A and total A to pull pants over hips. Pt declined going to gym and requested to return to bed. Stand<>pivot  back to bed in same manner mentioned above. Sit<>supine with CGA and cues for repositioning in bed. Concluded session with pt semi-reclined in bed, needs within reach, and bed alarm on. Therapist provided fresh drink for pt (NT made aware). 33 minutes missed of skilled PT due to fatigue.  Therapy Documentation Precautions:  Precautions Precautions: Fall Required Braces or Orthoses: Other Brace Other Brace: L wrist cock up splint to be worn during day per RN - per [facility] care chart (did not have during CIR eval) Restrictions Weight Bearing Restrictions: No  Therapy/Group: Individual Therapy Martin Majestic PT, DPT   03/22/2020, 7:29 AM

## 2020-03-22 NOTE — Progress Notes (Signed)
+/-   sleep R/T roommate OOB frequently. A little more interactive at HS. Up to [facility] this AM to void. Left lateral heel with healing blister site. Prevalon boots to bilateral feet. Alfredo Martinez A

## 2020-03-22 NOTE — Progress Notes (Signed)
Occupational Therapy Session Note  Patient Details  Name: Bryan [name] MRN: [id number] Date of Birth: [birth year]  Today's Date: 03/22/2020 OT Individual Time: 9191-6606 OT Individual Time Calculation (min): 69 min    Short Term Goals: Week 1:  OT Short Term Goal 1 (Week 1): Pt will complete bathing with mod assist OT Short Term Goal 2 (Week 1): Pt will complete UB dressing with mod assist OT Short Term Goal 3 (Week 1): Pt will complete LB dressing with max assist of one caregiver at sit > stand level OT Short Term Goal 4 (Week 1): Pt will complete toilet transfer with max assist of one caregiver  Skilled Therapeutic Interventions/Progress Updates:    Treatment session with focus on functional transfers and functional use of BUE.  Pt received supine in bed asleep.  Pt easily aroused to name, required wash cloth to fully arouse.  Pt required assistance due to decreased functional ROM and use of BUE.  Pt completed bed mobility with mod assist with max multimodal cues for sequencing and motor planning.  Pt completed sit > stand with mod assist, cues for hand placement with 1 UE on RW and 1 from bed.  Mod assist stand pivot transfer bed > w/c with RW with mod cues for sequencing.  Engaged in blocked practice stand pivot transfers with RW with mod assist and mod cues for sequencing.  Engaged in visual scanning and multi-step sequencing with table top tasks.  Pt required increased time and max multimodal cues for visual scanning and functional reach with BUE.  Pt able to cross midline and scan in all directions, however with inconsistent recall and even problem solving.  Pt returned to room and transferred back to bed min assist stand pivot with RW.  Pt able to advance BLE in to bed with min assist and engage in weight shifting and boosting to improve positioning in bed.  Therapy Documentation Precautions:  Precautions Precautions: Fall Required Braces or Orthoses: Other Brace Other Brace: L wrist  cock up splint to be worn during day per RN - per [facility] care chart (did not have during CIR eval) Restrictions Weight Bearing Restrictions: No Pain: Pain Assessment Pain Scale: 0-10 Pain Score: 0-No pain   Therapy/Group: Individual Therapy  Rosalio Loud 03/22/2020, 10:18 AM

## 2020-03-22 NOTE — Progress Notes (Signed)
PHYSICAL MEDICINE & REHABILITATION PROGRESS NOTE  Subjective/Complaints: Patient seen laying in bed this morning.  He states he slept well overnight.  Slept fairly overnight per nursing.   ROS: limited due to cognition.   Objective: Vital Signs: Blood pressure 115/73, pulse 73, temperature 98.7 F (37.1 C), resp. rate 18, height 5\' 10"  (1.778 m), weight 91 kg, SpO2 97 %. No results found. Recent Labs    03/21/20 0526  WBC 4.8  HGB 12.5*  HCT 38.0*  PLT 323   Recent Labs    03/21/20 0526  NA 133*  K 4.0  CL 102  CO2 23  GLUCOSE 99  BUN 11  CREATININE 0.87  CALCIUM 10.1    Intake/Output Summary (Last 24 hours) at 03/22/2020 1216 Last data filed at 03/22/2020 0802 Gross per 24 hour  Intake 836 ml  Output 300 ml  Net 536 ml     Pressure Injury 02/23/20 Heel Left Deep Tissue Pressure Injury - Purple or maroon localized area of discolored intact skin or blood-filled blister due to damage of underlying soft tissue from pressure and/or shear. (Active)  02/23/20 0800  Location: Heel  Location Orientation: Left  Staging: Deep Tissue Pressure Injury - Purple or maroon localized area of discolored intact skin or blood-filled blister due to damage of underlying soft tissue from pressure and/or shear.  Wound Description (Comments):   Present on Admission: No    Physical Exam: BP 115/73 (BP Location: Left Arm)   Pulse 73   Temp 98.7 F (37.1 C)   Resp 18   Ht 5\' 10"  (1.778 m)   Wt 91 kg   SpO2 97%   BMI 28.79 kg/m  Constitutional: No distress . Vital signs reviewed. HENT: Normocephalic.  Atraumatic. Eyes: EOMI. No discharge. Cardiovascular: No JVD.  RRR. Respiratory: Normal effort.  No stridor.  Bilateral clear to auscultation. GI: Non-distended.  BS +. Skin: Warm and dry.  Intact. Psych: Slowed.  Confused-?  Baseline Musc: No edema in extremities.  No tenderness in extremities. Neuro: Alert and oriented x2 Motor: 4-/5 throughout with apraxia,  unchanged Poor attention/concentration  Assessment/Plan: 1. Functional deficits which require 3+ hours per day of interdisciplinary therapy in a comprehensive inpatient rehab setting.  Physiatrist is providing close team supervision and 24 hour management of active medical problems listed below.  Physiatrist and rehab team continue to assess barriers to discharge/monitor patient progress toward functional and medical goals   Care Tool:  Bathing    Body parts bathed by patient: Chest,Face,Right upper leg,Left upper leg,Right arm,Left arm,Abdomen,Front perineal area   Body parts bathed by helper: Buttocks,Right lower leg,Left lower leg     Bathing assist Assist Level: Moderate Assistance - Patient 50 - 74%     Upper Body Dressing/Undressing Upper body dressing   What is the patient wearing?: Pull over shirt    Upper body assist Assist Level: Minimal Assistance - Patient > 75%    Lower Body Dressing/Undressing Lower body dressing      What is the patient wearing?: Underwear/pull up,Pants     Lower body assist Assist for lower body dressing: Maximal Assistance - Patient 25 - 49%     Toileting Toileting    Toileting assist Assist for toileting: Dependent - Patient 0%     Transfers Chair/bed transfer  Transfers assist     Chair/bed transfer assist level: Moderate Assistance - Patient 50 - 74%     Locomotion Ambulation   Ambulation assist   Ambulation activity did not  occur: Safety/medical concerns (fatigue, poor motor control/planning, weakness)  Assist level: 2 helpers Assistive device: Walker-rolling Max distance: 35ft   Walk 10 feet activity   Assist  Walk 10 feet activity did not occur: Safety/medical concerns (fatigue, poor motor control/planning, weakness)  Assist level: 2 helpers Assistive device: Walker-rolling   Walk 50 feet activity   Assist Walk 50 feet with 2 turns activity did not occur: Safety/medical concerns (fatigue, poor motor  control/planning, weakness)         Walk 150 feet activity   Assist Walk 150 feet activity did not occur: Safety/medical concerns (fatigue, poor motor control/planning, weakness)         Walk 10 feet on uneven surface  activity   Assist Walk 10 feet on uneven surfaces activity did not occur: Safety/medical concerns (fatigue, poor motor control/planning, weakness)         Wheelchair     Assist Will patient use wheelchair at discharge?: Yes Type of Wheelchair: Manual    Wheelchair assist level: Moderate Assistance - Patient 50 - 74% Max wheelchair distance: 38ft    Wheelchair 50 feet with 2 turns activity    Assist        Assist Level: Maximal Assistance - Patient 25 - 49%   Wheelchair 150 feet activity     Assist      Assist Level: Total Assistance - Patient < 25%    Medical Problem List and Plan: 1.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 Antithrombotics: -DVT/anticoagulation:Pharmaceutical:Lovenox -antiplatelet therapy: N/A 3. Pain Management:N/A 4. Mood:LCSW to follow for evaluation and support. -antipsychotic agents: See #11. 5. Neuropsych: This patientis notcapable of making decisions onhisown behalf. 6. Skin/Wound Care:Routine pressure relief measures. 7. Fluids/Electrolytes/Nutrition:Monitor I/Os.  8. T2DM with hyperglycemia: New diagnosis with A1C-7.1 on 1/19.  Carb modified diet.   Continue Levemir with metformin and meal coverage.   Use SSI for elevated BS  Relatively controlled on 1/25  Monitor with increased mobility. 9.Acute blood loss anemia:Resolved  Hemoglobin 13.0 on 1/19 10. Elevated Triglycerides: 294 on 1/19 11. Psychosis d/o Schizophrenia. Last documented admission 11/20 (history of non-compliance)  Currently off Abiligy, Cogentin and Risperdal during hospitalization.   Continue Seroquel 100 nightly  Decreased  Seroquel to 50 daily on 1/20 to improve daytime alertness, monitor Sleep wake chart.  Appreciate psych recs-started on fluoxetine 12.  Elevated Creatinine: Resolved  Continue to monitor 13. Hyponatremia  Na 133 on 1/24, monitor with addition of fluoxetine, plan order labs later this week  Continue to monitor 14.  Acute blood loss anemia  Hemoglobin 12.5 on 1/24, continue to monitor  LOS: 7 days A FACE TO FACE EVALUATION WAS PERFORMED  Brigett Estell Karis Juba 03/22/2020, 12:16 PM

## 2020-03-22 NOTE — Progress Notes (Signed)
Speech Language Pathology Daily Session Note  Patient Details  Name: Bryan [name] MRN: [id number] Date of Birth: 1976/02/14  Today's Date: 03/22/2020 SLP Individual Time: 9390-3009 SLP Individual Time Calculation (min): 57 min  Short Term Goals: Week 1: SLP Short Term Goal 1 (Week 1): Patient will orient to date/approximate time with modA cues for using visual/orientation aides. SLP Short Term Goal 2 (Week 1): Patient will recall at least 3 different tasks completed during day (in therapy/nursing care) with modA cues SLP Short Term Goal 3 (Week 1): Patient will perform basic level ADL's (feeding-self during meals, etc) and problem solving tasks with modA cues. SLP Short Term Goal 4 (Week 1): Patient will initiate during structured conversation and/or simulated tasks with mod-maxA cues. SLP Short Term Goal 5 (Week 1): Patient will demonstrate awareness to basic level errors during functional and simulated ADL tasks with modA  Skilled Therapeutic Interventions:Skilled ST services focused on cognitive skills. Pt was orientated to place and then month with visual aid, requiring verbal cuing for year, day of the week and date. Pt was able to list 11 out 12 months of the year in order after several trials to target sequencing. SLP facilitated verbal problem solving given functional scenarios and verbally sequencing 4 steps in order (ex: cooking an egg) required max A verbal cues. Pt demonstrated improved problem solving and sequencing in putting 3 step picture cards in order, pt required mod A verbal cues increasing to max A verbal cues due to fading sustained attention. While pt demonstrated continued delay in processing and with motor/verbal initiation, pt was able to maintain topic and initiate exchanges in informal conversation pertaining to family at the phrase level.  Pt was unable to recall events from OT and PT given max verbal cues. Pt was left in room with call bell within reach and bed alarm  set. SLP recommends to continue skilled services.     Pain Pain Assessment Pain Score: 0-No pain  Therapy/Group: Individual Therapy  Numan Zylstra  [facility] 03/22/2020, 2:58 PM

## 2020-03-22 NOTE — Progress Notes (Addendum)
Speech Language Pathology Daily Session Note  Patient Details  Name: Bryan [name] MRN: [id number] Date of Birth: 1975/09/09  Today's Date: 03/21/2020 SLP Individual Time:  1200-1300    Short Term Goals: Week 1: SLP Short Term Goal 1 (Week 1): Patient will orient to date/approximate time with modA cues for using visual/orientation aides. SLP Short Term Goal 2 (Week 1): Patient will recall at least 3 different tasks completed during day (in therapy/nursing care) with modA cues SLP Short Term Goal 3 (Week 1): Patient will perform basic level ADL's (feeding-self during meals, etc) and problem solving tasks with modA cues. SLP Short Term Goal 4 (Week 1): Patient will initiate during structured conversation and/or simulated tasks with mod-maxA cues. SLP Short Term Goal 5 (Week 1): Patient will demonstrate awareness to basic level errors during functional and simulated ADL tasks with modA  Skilled Therapeutic Interventions:SLP focused on cognitive skills. Pt was oriented to place and with visual aid of calendar was orientated to month and year. Pt expressed inability to read calendar for day and date due to impaired vision, but was able to name them with mod A verbal cues. SLP facilitated basic problem solving skills in self-feeding with lunch tray. Pt requested extra time appearing due to motor planning to placing food on fork and even when SLP placed items on fork pt appeared fatigued with bring fork to oral cavity. After 3 attempts pt demonstrated appropriate problem solving skills and requested assistance. SLP assisted with feeding pt and pt demonstrated ability to direct care with min A verbal cues. Pt was unable to recall month when questioned and presented with calendar. SLP facilitated listing months of the year in order, pt was able to name 10 out 12 month in order requiring phonetic cues to name 12 out 12, as where semantic cues for not effective in these trials. Pt was left with call bell  within reach and bed alarm set. Recommend to continue ST services.      Pain    Therapy/Group: Individual Therapy  Shelba Susi  [facility] 03/21/2020, 5:40 PM

## 2020-03-23 ENCOUNTER — Inpatient Hospital Stay (HOSPITAL_COMMUNITY): Payer: Medicaid Other

## 2020-03-23 ENCOUNTER — Inpatient Hospital Stay (HOSPITAL_COMMUNITY): Payer: Medicaid Other | Admitting: Occupational Therapy

## 2020-03-23 LAB — GLUCOSE, CAPILLARY
Glucose-Capillary: 90 mg/dL (ref 70–99)
Glucose-Capillary: 81 mg/dL (ref 70–99)
Glucose-Capillary: 66 mg/dL — ABNORMAL LOW (ref 70–99)
Glucose-Capillary: 71 mg/dL (ref 70–99)

## 2020-03-23 MED ORDER — ACETAMINOPHEN 500 MG PO TABS
500.0000 mg | ORAL_TABLET | Freq: Four times a day (QID) | ORAL | Status: DC
  Administered 2020-03-23 – 2020-04-08 (×25): 500 mg via ORAL
  Filled 2020-03-23 (×37): qty 1

## 2020-03-23 NOTE — Progress Notes (Addendum)
Patient ID: Bryan [name], male   DOB: [birth year], 45 y.o.   MRN: [id number]  Spoke with Mom via telephone to discuss team conference goals min assist level and target discharge 2/11. Closer to discharge will have Mom and his brother come in and do hands on care so will be prepared for discharge. Pt was included in this discussion and reports he is trying. Mom is pleased with how well he is doing and proud of him for working hard. Continue to work on discharge needs. Mom is coming every other day to see him and provide support to him.

## 2020-03-23 NOTE — Progress Notes (Signed)
Physical Therapy Session Note  Patient Details  Name: Bryan [name] MRN: [id number] Date of Birth: [birth year]  Today's Date: 03/23/2020 PT Individual Time: 1829-9371  PT Individual Time Calculation (min): 39 min  PT Missed Time: 45 minutes due to fatigue   Short Term Goals: Week 1:  PT Short Term Goal 1 (Week 1): pt will perform all bed mobility with min A overall PT Short Term Goal 1 - Progress (Week 1): Progressing toward goal PT Short Term Goal 2 (Week 1): pt will transfer sit<>stand with LRAD and min A PT Short Term Goal 2 - Progress (Week 1): Progressing toward goal PT Short Term Goal 3 (Week 1): Pt will initiate gait training with LRAD PT Short Term Goal 3 - Progress (Week 1): Met Week 2:  PT Short Term Goal 1 (Week 2): pt will perform all bed mobility with min A overall PT Short Term Goal 2 (Week 2): pt will transfer sit<>stand with LRAD and min A PT Short Term Goal 3 (Week 2): pt will ambulate 73f with LRAD and mod A of 1  Skilled Therapeutic Interventions/Progress Updates:  Treatment Session 1: 1046-1125 39 min Received pt supine in bed asleep, pt easily aroused and agreeable to therapy with convincing due to initial c/o fatigue, and denied any pain at rest but reports increased pain in R hip with mobility (RN aware). Session with emphasis on functional mobility/transfers, generalized strengthening, dynamic standing balance/coordination, NMR, motor control/sequencing, and improved activity tolerance. Pt rolled to L with min A and cues for sequencing and transferred L sidelying<>sitting EOB with mod A for trunk and LE management. Pt transferred stand<>pivot bed<>WC with RW and mod A from elevated bed with cues for hand placement on bed and RW and cues for stepping sequence when turning. [facility] and [facility] sit<>stand at table in dayroom with mod A x 2 trials with cues for scooting to edge of chair, hand placement on WC armrests, and anterior weight  shifting when standing. Worked on dynamic standing balance, fine motor control, and sequencing attempting to spell pt's name with min A for balance and mod/max cues. Pt with increased difficulty with motor control and sequencing order for letters but able to state what letter comes after the next. Pt with difficulty with visual scanning to locate certain letters. Pt able to identify what letter he needed next but then when he reached to grab the letter, chose the wrong one. Noted pt's RLE began trembling while standing due to fatigue and returned to sitting to rest. [facility] Concluded session with pt sitting in WC, needs within reach, and seatbelt alarm on.   Treatment Session 2:  Received pt supine in bed asleep, upon wakening pt reported fatigue from previous therapies. Therapist suggested OOB mobility, however pt politely refused stating "no, I'm tired." Therapist encouraged bed level exercises, however pt politely declined. Will attempt to make up time as able. 45 minutes missed of skilled physical therapy due to fatigue.   Therapy Documentation Precautions:  Precautions Precautions: Fall Required Braces or Orthoses: Other Brace Other Brace: L wrist cock up splint to be worn during day per RN - per [facility] care chart (did not have during CIR eval) Restrictions Weight Bearing Restrictions: No  Therapy/Group: Individual Therapy AAlfonse AlpersPT, DPT   03/23/2020, 7:35 AM

## 2020-03-23 NOTE — Progress Notes (Incomplete)
[facility] PHYSICAL MEDICINE & REHABILITATION PROGRESS NOTE  Subjective/Complaints: Patient seen laying in bed this morning.  He states he slept well overnight.  Slept fairly overnight per nursing.   ROS: limited due to cognition.   Objective: Vital Signs: Blood pressure 108/86, pulse 76, temperature 98.7 F (37.1 C), resp. rate 18, height 5\' 10"  (1.778 m), weight 91 kg, SpO2 (!) 89 %. No results found. Recent Labs    03/21/20 0526  WBC 4.8  HGB 12.5*  HCT 38.0*  PLT 323   Recent Labs    03/21/20 0526  NA 133*  K 4.0  CL 102  CO2 23  GLUCOSE 99  BUN 11  CREATININE 0.87  CALCIUM 10.1    Intake/Output Summary (Last 24 hours) at 03/23/2020 1145 Last data filed at 03/23/2020 0820 Gross per 24 hour  Intake 354 ml  Output -  Net 354 ml     Pressure Injury 02/23/20 Heel Left Deep Tissue Pressure Injury - Purple or maroon localized area of discolored intact skin or blood-filled blister due to damage of underlying soft tissue from pressure and/or shear. (Active)  02/23/20 0800  Location: Heel  Location Orientation: Left  Staging: Deep Tissue Pressure Injury - Purple or maroon localized area of discolored intact skin or blood-filled blister due to damage of underlying soft tissue from pressure and/or shear.  Wound Description (Comments):   Present on Admission: No    Physical Exam: BP 108/86   Pulse 76   Temp 98.7 F (37.1 C)   Resp 18   Ht 5\' 10"  (1.778 m)   Wt 91 kg   SpO2 (!) 89%   BMI 28.79 kg/m  Constitutional: No distress . Vital signs reviewed. HENT: Normocephalic.  Atraumatic. Eyes: EOMI. No discharge. Cardiovascular: No JVD.  RRR. Respiratory: Normal effort.  No stridor.  Bilateral clear to auscultation. GI: Non-distended.  BS +. Skin: Warm and dry.  Intact. Psych: Slowed.  Confused-?  Baseline Musc: No edema in extremities.  No tenderness in extremities. Neuro: Alert and oriented x2 Motor: 4-/5 throughout with apraxia, unchanged Poor  attention/concentration  Assessment/Plan: 1. Functional deficits which require 3+ hours per day of interdisciplinary therapy in a comprehensive inpatient rehab setting.  Physiatrist is providing close team supervision and 24 hour management of active medical problems listed below.  Physiatrist and rehab team continue to assess barriers to discharge/monitor patient progress toward functional and medical goals   Care Tool:  Bathing    Body parts bathed by patient: Chest,Face,Right upper leg,Left upper leg,Right arm,Left arm,Abdomen,Front perineal area   Body parts bathed by helper: Buttocks,Right lower leg,Left lower leg     Bathing assist Assist Level: Moderate Assistance - Patient 50 - 74%     Upper Body Dressing/Undressing Upper body dressing   What is the patient wearing?: Pull over shirt    Upper body assist Assist Level: Minimal Assistance - Patient > 75%    Lower Body Dressing/Undressing Lower body dressing      What is the patient wearing?: Underwear/pull up,Pants     Lower body assist Assist for lower body dressing: Maximal Assistance - Patient 25 - 49%     Toileting Toileting    Toileting assist Assist for toileting: Dependent - Patient 0%     Transfers Chair/bed transfer  Transfers assist     Chair/bed transfer assist level: Moderate Assistance - Patient 50 - 74%     Locomotion Ambulation   Ambulation assist   Ambulation activity did not occur: Safety/medical concerns (  fatigue, poor motor control/planning, weakness)  Assist level: 2 helpers Assistive device: Walker-rolling Max distance: 17ft   Walk 10 feet activity   Assist  Walk 10 feet activity did not occur: Safety/medical concerns (fatigue, poor motor control/planning, weakness)  Assist level: 2 helpers Assistive device: Walker-rolling   Walk 50 feet activity   Assist Walk 50 feet with 2 turns activity did not occur: Safety/medical concerns (fatigue, poor motor  control/planning, weakness)         Walk 150 feet activity   Assist Walk 150 feet activity did not occur: Safety/medical concerns (fatigue, poor motor control/planning, weakness)         Walk 10 feet on uneven surface  activity   Assist Walk 10 feet on uneven surfaces activity did not occur: Safety/medical concerns (fatigue, poor motor control/planning, weakness)         Wheelchair     Assist Will patient use wheelchair at discharge?: Yes Type of Wheelchair: Manual    Wheelchair assist level: Moderate Assistance - Patient 50 - 74% Max wheelchair distance: 72ft    Wheelchair 50 feet with 2 turns activity    Assist        Assist Level: Maximal Assistance - Patient 25 - 49%   Wheelchair 150 feet activity     Assist      Assist Level: Total Assistance - Patient < 25%    Medical Problem List and Plan: 1.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 Antithrombotics: -DVT/anticoagulation:Pharmaceutical:Lovenox -antiplatelet therapy: N/A 3. Pain Management:N/A 4. Mood:LCSW to follow for evaluation and support. -antipsychotic agents: See #11. 5. Neuropsych: This patientis notcapable of making decisions onhisown behalf. 6. Skin/Wound Care:Routine pressure relief measures. 7. Fluids/Electrolytes/Nutrition:Monitor I/Os.  8. T2DM with hyperglycemia: New diagnosis with A1C-7.1 on 1/19.  Carb modified diet.   Continue Levemir with metformin and meal coverage.   Use SSI for elevated BS  Relatively controlled on 1/25  Monitor with increased mobility. 9.Acute blood loss anemia:Resolved  Hemoglobin 13.0 on 1/19 10. Elevated Triglycerides: 294 on 1/19 11. Psychosis d/o Schizophrenia. Last documented admission 11/20 (history of non-compliance)  Currently off Abiligy, Cogentin and Risperdal during hospitalization.   Continue Seroquel 100 nightly  Decreased  Seroquel to 50 daily on 1/20 to improve daytime alertness, monitor Sleep wake chart.  Appreciate psych recs-started on fluoxetine 12.  Elevated Creatinine: Resolved  Continue to monitor 13. Hyponatremia  Na 133 on 1/24, monitor with addition of fluoxetine, plan order labs later this week  Continue to monitor 14.  Acute blood loss anemia  Hemoglobin 12.5 on 1/24, continue to monitor 15. Slow transit constipation  ****  LOS: 8 days A FACE TO FACE EVALUATION WAS PERFORMED  Bryan [name] Karis Juba 03/23/2020, 11:45 AM

## 2020-03-23 NOTE — Progress Notes (Signed)
Speech Language Pathology Weekly Progress and Session Note  Patient Details  Name: Bryan [name] MRN: [id number] Date of Birth: 02/10/1976  Beginning of progress report period: March 17, 2020 End of progress report period: March 23, 2020  Today's Date: 03/23/2020 SLP Individual Time: 0903-1000 SLP Individual Time Calculation (min): 57 min  Short Term Goals: Week 1: SLP Short Term Goal 1 (Week 1): Patient will orient to date/approximate time with modA cues for using visual/orientation aides. SLP Short Term Goal 1 - Progress (Week 1): Met SLP Short Term Goal 2 (Week 1): Patient will recall at least 3 different tasks completed during day (in therapy/nursing care) with modA cues SLP Short Term Goal 2 - Progress (Week 1): Not met SLP Short Term Goal 3 (Week 1): Patient will perform basic level ADL's (feeding-self during meals, etc) and problem solving tasks with modA cues. SLP Short Term Goal 3 - Progress (Week 1): Not met SLP Short Term Goal 4 (Week 1): Patient will initiate during structured conversation and/or simulated tasks with mod-maxA cues. SLP Short Term Goal 4 - Progress (Week 1): Met SLP Short Term Goal 5 (Week 1): Patient will demonstrate awareness to basic level errors during functional and simulated ADL tasks with modA SLP Short Term Goal 5 - Progress (Week 1): Met    New Short Term Goals: Week 2: SLP Short Term Goal 1 (Week 2): Patient will orient to date/approximate time with min A cues for using visual/orientation aides. SLP Short Term Goal 2 (Week 2): Patient will recall at least 3 different tasks completed during day (in therapy/nursing care) with mod A cues SLP Short Term Goal 3 (Week 2): Patient will perform basic level ADL's (feeding-self during meals, etc) and problem solving tasks with mod A cues. SLP Short Term Goal 4 (Week 2): Patient will initiate during structured conversation and/or simulated tasks with mod A cues. SLP Short Term Goal 5 (Week 2): Patient  will demonstrate awareness to basic level errors during functional and simulated ADL tasks with min A SLP Short Term Goal 6 (Week 2): Pt will demonstrate sustained attention to basic tasks in 5 minute intervals with mod A verbal cues for redirection.  Weekly Progress Updates:Pt made mild progress meeting 2 out 5 goals. Pt currently requires max-mod A for basic problem solving, error awareness, sustained attention, recall and orientation. Pt is able to participate in conversational exchanges and express wants/need through direct questions, however is not initiating communication (execpt for greetings) or utilizing call bell. Pt continues to demonstrate delayed processing and further impacted by fatigue, impairments in vision and motor planning abilities. Pt would continue to benefit from skilled ST services in order to maximize functional independence and reduce burden of care, requiring 24 hour supervision at discharge with continued skilled ST services.    Intensity: Minumum of 1-2 x/day, 30 to 90 minutes Frequency: 3 to 5 out of 7 days Duration/Length of Stay: 2/11 Treatment/Interventions: Cognitive remediation/compensation;Environmental controls;Functional tasks;Internal/external aids;Patient/family education;Speech/Language facilitation;Cueing hierarchy   Daily Session  Skilled Therapeutic Interventions:  Skilled ST services focused on cognitive skills. Pt was asleep but agree to participate in services. SLP facilitated basic problem solving, error awareness and sustained attention in card sorting task by 3 colors, pt required max A verbal cues for attention to task and was able to correct all errors once pt was notified. Pt demonstrated improved self-correction of errors in basic card task, WAR, requiring mod A fade to supervision A verbal cues for error awareness and problem solving. Reduced attention and  lethargy appeared to impact cognitive skills in presented tasks. Pt was left in room with  call bell within reach and bed alarm set. SLP recommends to continue skilled services.   General    Pain Pain Assessment Pain Score: 0-No pain  Therapy/Group: Individual Therapy  Monterio Bob  [facility] 03/23/2020, 4:46 PM

## 2020-03-23 NOTE — Patient Care Conference (Signed)
Inpatient RehabilitationTeam Conference and Plan of Care Update Date: 03/23/2020   Time: 11:45 AM    Patient Name: Bryan [name]      Medical Record Number: [id number]  Date of Birth: 10-Aug-1975 Sex: Male         Room/Bed: 4W12C/4W12C-01 Payor Info: Payor: MEDICAID [place] / Plan: MEDICAID [place] ACCESS / Product Type: *No Product type* /    Admit Date/Time:  03/15/2020  5:13 PM  Primary Diagnosis:  Anoxic brain injury [facility])  Hospital Problems: Principal Problem:   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HCC) Active Problems:   Schizophrenia (HCC)   Elevated triglycerides with high cholesterol   Controlled type 2 diabetes mellitus with hyperglycemia (HCC)   Elevated serum creatinine   Hyponatremia   Acute blood loss anemia    Expected Discharge Date: Expected Discharge Date: 04/08/20  Team Members Present: Physician leading conference: Dr. Maryla Morrow Care Coodinator Present: Chana Bode, RN, BSN, CRRN;Becky Dupree, LCSW Nurse Present: Regino Schultze, RN PT Present: Raechel Chute, PT OT Present: Rosalio Loud, OT SLP Present: Colin Benton, SLP PPS Coordinator present : Edson Snowball, PT     Current Status/Progress Goal Weekly Team Focus  Bowel/Bladder             Swallow/Nutrition/ Hydration             ADL's   mod A bed mobility, mod-max assist bathing and dressing at sit > stand level, mod-max stand pivot with RW, impaired cognition, vision, and decreased functional use of BUE for self-feeding, bathing, and dressing  Min A overall  ADL retraining, transfers, sit > stand, dynamic standing balance, functional use of BUE, activity tolerance, sequencing, problem solving   Mobility   rolling min A, supine<>sit mod A, transfers mod A with RW, gait 66ft with RW mod +2  CGA transfers, min A gait and car transfer  functional mobility/transfers, generalized strengthening, dynamic standing balance/coordination, ambulation, NMR, motor control, and endurance   Communication   extra time/delayed  processing, at word/phrase level Min A  Supervision A  initating verbal response and expanding utternace for complexity   Safety/Cognition/ Behavioral Observations  Max-Mod A  Min A  basic proble solving, error awareness, sustained attention and orientation   Pain             Skin               Discharge Planning:  Plan to go home with Mom and brother's assisting, unsure if realize the amount of care he will be at discharge. Will need family education prior to discharge home   Team Discussion: Medically stable except for sodium level trending down, MD monitoring. Prompted toileting and encouragement to call for assistance due to slow processing. Right hip pain monitored. Progress limited by cognition, slow processing, poor attention, visual issues and poor motor planning with ataxia. Discussion of premorbid cognitive functional status and assistance at home for discharge.  Patient on target to meet rehab goals: yes, goals set for CGA- min assist. Min assist for B+D and gait.  *See Care Plan and progress notes for long and short-term goals.   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  Barriers to Discharge: Medical stability;New diabetic;Home enviroment  access/layout;Decreased family/caregiver support;Behavior   Possible Resolutions to [employer] Focus: Therapies, optimize DM meds, bowel meds   Continued Need for Acute Rehabilitation Level of Care: The patient requires daily medical management by a physician with specialized training in physical medicine and rehabilitation for the following  reasons: Direction of a multidisciplinary physical rehabilitation program to maximize functional independence : Yes Medical management of patient stability for increased activity during participation in an intensive rehabilitation regime.: Yes Analysis of laboratory values and/or radiology reports with any subsequent need for medication adjustment and/or medical intervention. : Yes   I attest that I was present, lead the team conference, and concur with the assessment and plan of the team.   Chana Bode B 03/23/2020, 3:23 PM

## 2020-03-23 NOTE — Progress Notes (Signed)
Nutrition Follow-up  DOCUMENTATION CODES:   Not applicable  INTERVENTION:   - Continue ProSource Plus 30 ml TID, each supplement provides 100 kcal and 15 grams of protein  - Continue MVI with minerals daily  - Diabetes diet education started with pt's mother via phone call and "Carbohydrate Counting for People with Diabetes" handout left in pt's room; RD to follow up with pt's mother next week to provide further education  NUTRITION DIAGNOSIS:   Increased nutrient needs related to wound healing,other (therapies) as evidenced by estimated needs.  Ongoing  GOAL:   Patient will meet greater than or equal to 90% of their needs  Progressing  MONITOR:   PO intake,Supplement acceptance,Labs,Weight trends,Skin,I & O's  REASON FOR ASSESSMENT:   Consult Diet education  ASSESSMENT:   45 year old male with PMH of HTN and schizophrenia who was admitted on 01/25/20 with newly diagnosed DM with DKA with AKI. On 11/30, patient found apneic and pulseless requiring two rounds of CPR with ACLS protocol with ROSC. Pt was intubated and work-up done revealing moderate acute pancreatitis. Pt required tracheostomy 12/18. Pt tolerated extubation and self decannulated on 03/03/20. Admitted to CIR on 1/18.  Attempted to speak with pt at bedside. Pt sleeping soundly at time of RD visit and did not awake to RD voice.  Spoke with pt's mother via phone call to start DM diet education. [name] with insightful questions. Plan to follow up with pt's mother in person on 03/29/20 to continue with diet education.  RD left "Carbohydrate Counting for People with Diabetes" handout from the [employer] on tray table in pt's room. [name] is aware of this and will look for it when she comes to visit tomorrow.  Discussed with pt's mother different food groups and their effects on blood sugar, emphasizing carbohydrate-containing foods. Handout provides a list of carbohydrates and  recommended serving sizes of common foods.  Discussed with pt's mother importance of controlled and consistent carbohydrate intake throughout the day. Provided examples of ways to balance meals/snacks and encouraged intake of high-fiber, whole grain complex carbohydrates.  Weight stable at this time.  Meal Completion: 75-100% x last 8 documented meals  Medications reviewed and include: ProSource Plus TID, colace, SSI, novolog 5 units TID with meals, levemir 30 units daily, metformin, MVI with minerals daily, protonix  Labs reviewed. CBG's: 70-102 x 24 hours  NUTRITION - FOCUSED PHYSICAL EXAM:  Deferred. Pt sleeping soundly at time of RD visit.  Diet Order:   Diet Order            Diet Carb Modified Fluid consistency: Thin; Room service appropriate? Yes  Diet effective now                 EDUCATION NEEDS:   Not appropriate for education at this time  Skin:  Skin Assessment: Skin Integrity Issues: DTI: left heel Incisions: neck  Last BM:  03/20/20  Height:   Ht Readings from Last 1 Encounters:  03/15/20 5\' 10"  (1.778 m)    Weight:   Wt Readings from Last 1 Encounters:  03/21/20 91 kg    BMI:  Body mass index is 28.79 kg/m.  Estimated Nutritional Needs:   Kcal:  2200-2400  Protein:  130-150 grams  Fluid:  2.2-2.4 L    03/23/20, MS, RD, LDN Inpatient Clinical Dietitian Please see [facility] for contact information.

## 2020-03-23 NOTE — Progress Notes (Signed)
Physical Therapy Weekly Progress Note  Patient Details  Name: Bryan [name] MRN: [id number] Date of Birth: 16-Apr-1975  Beginning of progress report period: March 16, 2020 End of progress report period: March 23, 2020  Patient has met 1 of 3 short term goals. Pt demonstrates slow progress towards long term goals. Pt currently requires mod A overall for bed mobility due to poor sequencing, however pt was able to perform bed mobility with CGA once during therapy. Pt requires mod A for sit<>stand and stand<>pivot transfers with RW with mod/max cues for sequencing and technique and mod A +2 to ambulate 66f with RW. Pt is limited by poor motor planning/sequencing, cognitive deficits, LE ataxia, visual field deficits, generalized weakness, and poor endurance.   Patient continues to demonstrate the following deficits muscle weakness, decreased cardiorespiratoy endurance, impaired timing and sequencing, abnormal tone, unbalanced muscle activation, ataxia, decreased coordination and decreased motor planning, decreased visual acuity, decreased awareness, decreased problem solving, decreased safety awareness and decreased memory and decreased standing balance, decreased postural control and decreased balance strategies and therefore will continue to benefit from skilled PT intervention to increase functional independence with mobility.  Patient progressing toward long term goals..  Continue plan of care.  PT Short Term Goals Week 1:  PT Short Term Goal 1 (Week 1): pt will perform all bed mobility with min A overall PT Short Term Goal 1 - Progress (Week 1): Progressing toward goal PT Short Term Goal 2 (Week 1): pt will transfer sit<>stand with LRAD and min A PT Short Term Goal 2 - Progress (Week 1): Progressing toward goal PT Short Term Goal 3 (Week 1): Pt will initiate gait training with LRAD PT Short Term Goal 3 - Progress (Week 1): Met Week 2:  PT Short Term Goal 1 (Week 2): pt will perform all bed  mobility with min A overall PT Short Term Goal 2 (Week 2): pt will transfer sit<>stand with LRAD and min A PT Short Term Goal 3 (Week 2): pt will ambulate 155fwith LRAD and mod A of 1  Skilled Therapeutic Interventions/Progress Updates:  Ambulation/gait training;Discharge planning;Functional mobility training;Psychosocial support;Therapeutic Activities;Visual/perceptual remediation/compensation;Balance/vestibular training;Disease management/prevention;Neuromuscular re-education;Skin care/wound management;Therapeutic Exercise;Wheelchair propulsion/positioning;Cognitive remediation/compensation;DME/adaptive equipment instruction;Pain management;Splinting/orthotics;UE/LE Strength taining/ROM;Community reintegration;Functional electrical stimulation;Patient/family education;Stair training;UE/LE Coordination activities  Therapy Documentation Precautions:  Precautions Precautions: Fall Required Braces or Orthoses: Other Brace Other Brace: L wrist cock up splint to be worn during day per RN - per [facility] care chart (did not have during CIR eval) Restrictions Weight Bearing Restrictions: No  Therapy/Group: Individual Therapy AnAlfonse AlpersT, DPT   03/23/2020, 7:33 AM

## 2020-03-23 NOTE — Progress Notes (Signed)
Occupational Therapy Weekly Progress Note  Patient Details  Name: Bryan [name] MRN: [id number] Date of Birth: [birth year]  Beginning of progress report period: March 16, 2020 End of progress report period: March 23, 2020  Today's Date: 03/23/2020 OT Individual Time: 1300-1400 OT Individual Time Calculation (min): 60 min    Patient has met 4 of 4 short term goals.  Pt is making slow progress towards goals due to impaired motor planning, apraxia, sequencing, and fatigue.  Pt continues to require min-mod assist for bed mobility due to decreased motor planning and apraxia.  Pt also with fluctuating vision since admission to CIR with inconsistencies in reported vision.  Pt is able to complete sit > stand with mod assist and min-mod assist stand pivot transfer with RW.  Pt requires max multimodal cues for sequencing and motor planning during familiar, self-care tasks and mobility.  Pt continues to demonstrate decreased ROM and motor control in BUE, impacting ability to complete self-feeding and self-care tasks.    Patient continues to demonstrate the following deficits: muscle weakness, decreased cardiorespiratoy endurance, impaired timing and sequencing, unbalanced muscle activation, decreased coordination and decreased motor planning, decreased visual perceptual skills, decreased attention to right, decreased attention, decreased awareness, decreased problem solving, decreased safety awareness, decreased memory and delayed processing and decreased sitting balance, decreased standing balance, decreased postural control and decreased balance strategies and therefore will continue to benefit from skilled OT intervention to enhance overall performance with BADL and Reduce care partner burden.  Patient progressing toward long term goals..  Continue plan of care.  OT Short Term Goals Week 1:  OT Short Term Goal 1 (Week 1): Pt will complete bathing with mod assist OT Short Term Goal 1 - Progress (Week  1): Met OT Short Term Goal 2 (Week 1): Pt will complete UB dressing with mod assist OT Short Term Goal 2 - Progress (Week 1): Met OT Short Term Goal 3 (Week 1): Pt will complete LB dressing with max assist of one caregiver at sit > stand level OT Short Term Goal 3 - Progress (Week 1): Met OT Short Term Goal 4 (Week 1): Pt will complete toilet transfer with max assist of one caregiver OT Short Term Goal 4 - Progress (Week 1): Met Week 2:  OT Short Term Goal 1 (Week 2): Pt will complete toilet transfers with mod assist of one caregiver 3 out of 5 times to demonstrate increased carryover OT Short Term Goal 2 (Week 2): Pt will complete LB dressing with mod assist at sit > stand level OT Short Term Goal 3 (Week 2): Pt will complete UB dressing with min assist OT Short Term Goal 4 (Week 2): Pt will complete toileting task with max assist of one caregiver  Skilled Therapeutic Interventions/Progress Updates:    Treatment session with focus on self-care retraining and functional transfers.  Pt received upright in w/c having remained seated OOB for lunch.  Pt with towels and clean clothes set out on bed.  Pt agreeable to shower this session.  Therapist gathered items and transferred pt down to ADL apt bathroom for shower.  Pt able to complete sit > stand to RW with min-mod assist and cues for hand placement.  Pt completed stand pivot transfers min-mod assist with RW and max multimodal cues for motor planning and sequencing.  Pt with increased initiation and participation in bathing at shower level.  Pt required max multimodal cues for sequencing and thoroughness during bathing.  Completed UB dressing with increased assist due to  long sleeved shirt.  Therapist assisted with donning pants seated on edge of tub bench, pt able to stand with min assist to RW and while maintaining static standing therapist pulled pants over hips.  Min assist stand pivot back to w/c.  Pt transitioned back to room.  Pt requested to  transfer back to bed due to fatigue.  Pt completed with min assist and improved sequencing, still demonstrating motor apraxia.  Pt returned to supine with multimodal cues for technique and left in supine with all needs in reach.  Therapy Documentation Precautions:  Precautions Precautions: Fall Required Braces or Orthoses: Other Brace Other Brace: L wrist cock up splint to be worn during day per RN - per [facility] care chart (did not have during CIR eval) Restrictions Weight Bearing Restrictions: No Pain:  Pt with no c/o pain ADL: ADL Eating: Moderate assistance Where Assessed-Eating: Bed level Grooming: Moderate assistance Where Assessed-Grooming: Edge of bed Upper Body Bathing: Maximal assistance Where Assessed-Upper Body Bathing: Edge of bed Lower Body Bathing: Dependent Where Assessed-Lower Body Bathing: Bed level Where Assessed-Upper Body Dressing: Edge of bed Lower Body Dressing: Dependent Where Assessed-Lower Body Dressing: Bed level Toileting: Dependent Toilet Transfer: Not assessed Tub/Shower Transfer: Not assessed Vision   Perception    Praxis   Exercises:   Other Treatments:     Therapy/Group: Individual Therapy  Simonne Come 03/23/2020, 8:20 AM

## 2020-03-23 NOTE — Progress Notes (Signed)
[facility] PHYSICAL MEDICINE & REHABILITATION PROGRESS NOTE  Subjective/Complaints: Patient seen lying in bed this morning. He states he slept well overnight. No reported issues overnight. Visual deficits reported by mother, inconsistent with therapies.   ROS: limited due to cognition.  Objective: Vital Signs: Blood pressure 108/86, pulse 76, temperature 98.7 F (37.1 C), resp. rate 18, height 5\' 10"  (1.778 m), weight 91 kg, SpO2 (!) 89 %. No results found. Recent Labs    03/21/20 0526  WBC 4.8  HGB 12.5*  HCT 38.0*  PLT 323   Recent Labs    03/21/20 0526  NA 133*  K 4.0  CL 102  CO2 23  GLUCOSE 99  BUN 11  CREATININE 0.87  CALCIUM 10.1    Intake/Output Summary (Last 24 hours) at 03/23/2020 1207 Last data filed at 03/23/2020 0820 Gross per 24 hour  Intake 354 ml  Output --  Net 354 ml     Pressure Injury 02/23/20 Heel Left Deep Tissue Pressure Injury - Purple or maroon localized area of discolored intact skin or blood-filled blister due to damage of underlying soft tissue from pressure and/or shear. (Active)  02/23/20 0800  Location: Heel  Location Orientation: Left  Staging: Deep Tissue Pressure Injury - Purple or maroon localized area of discolored intact skin or blood-filled blister due to damage of underlying soft tissue from pressure and/or shear.  Wound Description (Comments):   Present on Admission: No    Physical Exam: BP 108/86   Pulse 76   Temp 98.7 F (37.1 C)   Resp 18   Ht 5\' 10"  (1.778 m)   Wt 91 kg   SpO2 (!) 89%   BMI 28.79 kg/m  Constitutional: No distress . Vital signs reviewed. HENT: Normocephalic.  Atraumatic. Eyes: EOMI. No discharge. Cardiovascular: No JVD.  RRR. Respiratory: Normal effort.  No stridor.  Bilateral clear to auscultation. GI: Non-distended.  BS +. Skin: Warm and dry.  Intact. Psych: Slowed. Confused-? Baseline normal mood.  Normal behavior. Musc: No edema in extremities.  No tenderness in extremities. Neuro:  Alert and oriented to person and place only. Motor: 4-/5 throughout with apraxia, stable Poor attention/concentration  Assessment/Plan: 1. Functional deficits which require 3+ hours per day of interdisciplinary therapy in a comprehensive inpatient rehab setting.  Physiatrist is providing close team supervision and 24 hour management of active medical problems listed below.  Physiatrist and rehab team continue to assess barriers to discharge/monitor patient progress toward functional and medical goals   Care Tool:  Bathing    Body parts bathed by patient: Chest,Face,Right upper leg,Left upper leg,Right arm,Left arm,Abdomen,Front perineal area   Body parts bathed by helper: Buttocks,Right lower leg,Left lower leg     Bathing assist Assist Level: Moderate Assistance - Patient 50 - 74%     Upper Body Dressing/Undressing Upper body dressing   What is the patient wearing?: Pull over shirt    Upper body assist Assist Level: Minimal Assistance - Patient > 75%    Lower Body Dressing/Undressing Lower body dressing      What is the patient wearing?: Underwear/pull up,Pants     Lower body assist Assist for lower body dressing: Maximal Assistance - Patient 25 - 49%     Toileting Toileting    Toileting assist Assist for toileting: Dependent - Patient 0%     Transfers Chair/bed transfer  Transfers assist     Chair/bed transfer assist level: Moderate Assistance - Patient 50 - 74%     Locomotion Ambulation  Ambulation assist   Ambulation activity did not occur: Safety/medical concerns (fatigue, poor motor control/planning, weakness)  Assist level: 2 helpers Assistive device: Walker-rolling Max distance: 17ft   Walk 10 feet activity   Assist  Walk 10 feet activity did not occur: Safety/medical concerns (fatigue, poor motor control/planning, weakness)  Assist level: 2 helpers Assistive device: Walker-rolling   Walk 50 feet activity   Assist Walk 50 feet  with 2 turns activity did not occur: Safety/medical concerns (fatigue, poor motor control/planning, weakness)         Walk 150 feet activity   Assist Walk 150 feet activity did not occur: Safety/medical concerns (fatigue, poor motor control/planning, weakness)         Walk 10 feet on uneven surface  activity   Assist Walk 10 feet on uneven surfaces activity did not occur: Safety/medical concerns (fatigue, poor motor control/planning, weakness)         Wheelchair     Assist Will patient use wheelchair at discharge?: Yes Type of Wheelchair: Manual    Wheelchair assist level: Moderate Assistance - Patient 50 - 74% Max wheelchair distance: 58ft    Wheelchair 50 feet with 2 turns activity    Assist        Assist Level: Maximal Assistance - Patient 25 - 49%   Wheelchair 150 feet activity     Assist      Assist Level: Total Assistance - Patient < 25%    Medical Problem List and Plan: 1.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 Please see conference note from today as well.  2. Antithrombotics: -DVT/anticoagulation:Pharmaceutical:Lovenox -antiplatelet therapy: N/A 3. Pain Management:  Reported with activity per therapies  Tylenol scheduled on 1/26 4. Mood:LCSW to follow for evaluation and support. -antipsychotic agents: See #11. 5. Neuropsych: This patientis notcapable of making decisions onhisown behalf. 6. Skin/Wound Care:Routine pressure relief measures. 7. Fluids/Electrolytes/Nutrition:Monitor I/Os.  8. T2DM with hyperglycemia: New diagnosis with A1C-7.1 on 1/19.  Carb modified diet.   Continue Levemir with metformin and meal coverage.   Use SSI for elevated BS  Relatively  controlled on 1/26  Monitor with increased mobility. 9.Acute blood loss anemia:Resolved  Hemoglobin 13.0 on 1/19 10. Elevated Triglycerides: 294 on 1/19 11. Psychosis d/o Schizophrenia. Last documented admission 11/20 (history of non-compliance)  Currently off Abiligy, Cogentin and Risperdal during hospitalization.   Continue Seroquel 100 nightly  Decreased Seroquel to 50 daily on 1/20 to improve daytime alertness, monitor Sleep wake chart.  Appreciate psych recs-started on fluoxetine  Will inquire about baseline 12.  Elevated Creatinine: Resolved  Continue to monitor 13. Hyponatremia  Na 133 on 1/24, monitor with addition of fluoxetine, plan order labs later this week  Continue to monitor 14.  Acute blood loss anemia  Hemoglobin 12.5 on 1/24, continue to monitor 15. Visual deficits  Likely secondary to retention +/- diabetic  LOS: 8 days A FACE TO FACE EVALUATION WAS PERFORMED  Ryn Peine Karis Juba 03/23/2020, 12:07 PM

## 2020-03-24 ENCOUNTER — Inpatient Hospital Stay (HOSPITAL_COMMUNITY): Payer: Medicaid Other

## 2020-03-24 DIAGNOSIS — H539 Unspecified visual disturbance: Secondary | ICD-10-CM

## 2020-03-24 LAB — GLUCOSE, CAPILLARY
Glucose-Capillary: 88 mg/dL (ref 70–99)
Glucose-Capillary: 95 mg/dL (ref 70–99)
Glucose-Capillary: 98 mg/dL (ref 70–99)
Glucose-Capillary: 65 mg/dL — ABNORMAL LOW (ref 70–99)
Glucose-Capillary: 88 mg/dL (ref 70–99)
Glucose-Capillary: 103 mg/dL — ABNORMAL HIGH (ref 70–99)

## 2020-03-24 NOTE — Progress Notes (Signed)
Physical Therapy Session Note  Patient Details  Name: Bryan [name] MRN: [id number] Date of Birth: 1975-11-28  Today's Date: 03/24/2020 PT Individual Time: 9675-9163 PT Individual Time Calculation (min): 69 min   Short Term Goals: Week 1:  PT Short Term Goal 1 (Week 1): pt will perform all bed mobility with min A overall PT Short Term Goal 1 - Progress (Week 1): Progressing toward goal PT Short Term Goal 2 (Week 1): pt will transfer sit<>stand with LRAD and min A PT Short Term Goal 2 - Progress (Week 1): Progressing toward goal PT Short Term Goal 3 (Week 1): Pt will initiate gait training with LRAD PT Short Term Goal 3 - Progress (Week 1): Met Week 2:  PT Short Term Goal 1 (Week 2): pt will perform all bed mobility with min A overall PT Short Term Goal 2 (Week 2): pt will transfer sit<>stand with LRAD and min A PT Short Term Goal 3 (Week 2): pt will ambulate 63f with LRAD and mod A of 1  Skilled Therapeutic Interventions/Progress Updates:   Received pt supine in bed with eyes closed, upon wakening pt agreeable to therapy, and denied any pain  during session. Session with emphasis on functional mobility/transfers, generalized strengthening, dynamic standing balance/coordinaiton, NMR, motor planning/sequencing, and improved activity tolerance. Therapist asked pt what he did in PT this morning, however pt unable to recall stating "I forgot." Pt transferred supine<>sitting EOB with min A and transferred stand<>pivot bed<>WC with RW and mod A with cues for hand placement on bed and on RW and for turning technique; however pt required overall less cues then previous session. [facility] and engaged in dynamic standing balance standing with 1 UE support on RW and min A for balance using BITS. Emphasis on fine motor control, reaching outside BOS, motor control/coordination, and standing balance.  -visual scanning with single target for 3 minutes with 35% accuracy. Pt  with significant dysmetria bilaterally and required increased time to locate circles -memory with images. Pt required max cues for sequencing. Pt able to read words "key" "bell" and "car"  and recall sequence but when the images appeared pt with increased difficulty locating where pictures were on screen. Pt with continued dysmetria. Pt then reported "I'm tired, it feels like I'm running out of energy fast" and returned to sitting to rest.  Pt transferred stand<>pivot WC<>mat with RW and mod A with same cues mentioned above. Worked on dynamic sitting balance, LE control, and coordination kicking beach ball to therapist x10 reps on each LE. Pt kicking too early ~30% of the time resulting in pt missing ball and required cues for which LE to kick with as pt getting R and L confused. Pt stated "that was hard", "it felt good" after activity. Transitioned to seated ball toss. Noted pt with delayed reaction letting ball bounce off his chest prior to catching and throwing back with RUE only x10 reps. Encouraged pt to use both UE and pt performed additional x10 tosses all while maintaining dynamic sitting balance with supervision. Worked on dynamic standing balance alternating tapping each LE to single cone with BUE support on RW and mod A x4 reps. Pt required mod cues for L versus R and for reminder of purpose of task. Pt reported increased R hip "soreness" and requested to sit. Pt performed the following exercises sitting on mat with close supervision and heavy reliance on UE support with mod/max cues for technique and sequencing: -hip flexion x5  bilaterally -LAQ x5 bilaterally -hip adduction ball squeezes x10 -bicep curls with 2lb dowel x10 Pt transferred mat<>WC stand<>pivot with RW and mod A and transported back to room in [facility] total A. Stand<>pivot WC<>bed with RW min A and sit<>supine with CGA and cues for repositioning in middle of bed. Concluded session with pt supine in bed, needs within reach, and bed alarm  on. Provided pt with apple juice and provided supervision while pt drank.   Therapy Documentation Precautions:  Precautions Precautions: Fall Required Braces or Orthoses: Other Brace Other Brace: L wrist cock up splint to be worn during day per RN - per [facility] care chart (did not have during CIR eval) Restrictions Weight Bearing Restrictions: No  Therapy/Group: Individual Therapy Alfonse Alpers PT, DPT   03/24/2020, 7:26 AM

## 2020-03-24 NOTE — Progress Notes (Signed)
Physical Therapy Session Note  Patient Details  Name: Bryan [name] MRN: [id number] Date of Birth: [birth year]  Today's Date: 03/24/2020 PT Individual Time: 0800-0855 PT Individual Time Calculation (min): 55 min   Short Term Goals: Week 2:  PT Short Term Goal 1 (Week 2): pt will perform all bed mobility with min A overall PT Short Term Goal 2 (Week 2): pt will transfer sit<>stand with LRAD and min A PT Short Term Goal 3 (Week 2): pt will ambulate 29ft with LRAD and mod A of 1  Skilled Therapeutic Interventions/Progress Updates:     Patient received supine in bed, agreeable to PT. He denies pain when asked. Patient only oriented to person and place stating "hospital" when asked, at this time. ModA bed mobility to come sit edge of bed with noted significantly decreased motor planning for this task. Patient rigid in B LE reporting pain in R hip when PT attempting to assist B LE off of bed. ModA to transfer to wc via lateral scoot. PT handing patient tooth brush to brush his teeth- patient asking "what do you want me to do with this?" Max verbal cues needed to thoroughly brush teeth and sequence task correctly. He was also able to wash face with wet washcloth and Min verbal cues for sequencing task. PT propelling patient in wc to therapy gym for time management and energy conservation. Patient completing Kinetron at 10cm/s with BLE at increasing time intervals to assess for increasing attention to task. Patient able to attend to task for ~30s at a time with consistent verbal cues to continue task. Patient able to stand with ModA and U UE support on table to order numbers from 1-8 with Max verbal cues for accuracy. Patient with noted visual impairments effecting depth perception. This could be complicated by mild ataxia noted in BUE. Patient able to remain standing ~69mins before needing to sit due to fatigue. Patient returning to room, transferring to bed via stand pivot with ModA and max verbal cues for  sequencing. ModA bed mobility to return supine. Bed alarm on, call light within reach.    Therapy Documentation Precautions:  Precautions Precautions: Fall Required Braces or Orthoses: Other Brace Other Brace: L wrist cock up splint to be worn during day per RN - per [facility] care chart (did not have during CIR eval) Restrictions Weight Bearing Restrictions: No    Therapy/Group: Individual Therapy  Elizebeth Koller, PT, DPT, CBIS  03/24/2020, 7:43 AM

## 2020-03-24 NOTE — Progress Notes (Signed)
Occupational Therapy Session Note  Patient Details  Name: Bryan [name] MRN: [id number] Date of Birth: [birth year]  Today's Date: 03/24/2020 OT Individual Time: 1500-1600 OT Individual Time Calculation (min): 60 min    Short Term Goals: Week 1:  OT Short Term Goal 1 (Week 1): Pt will complete bathing with mod assist OT Short Term Goal 1 - Progress (Week 1): Met OT Short Term Goal 2 (Week 1): Pt will complete UB dressing with mod assist OT Short Term Goal 2 - Progress (Week 1): Met OT Short Term Goal 3 (Week 1): Pt will complete LB dressing with max assist of one caregiver at sit > stand level OT Short Term Goal 3 - Progress (Week 1): Met OT Short Term Goal 4 (Week 1): Pt will complete toilet transfer with max assist of one caregiver OT Short Term Goal 4 - Progress (Week 1): Met  Skilled Therapeutic Interventions/Progress Updates:    1;1. Pt received in bed agreeable ot OT after encouragement and shower. No pain reported. Pt completes stand pivot transfers with MIN A for power up and MIN-MOD A during pivotal steps to w/c/TTB/EOB throughotu session d/t ataxia and increased postural sway. Pt overall requires MOD A ofr bathing sit to stand level in tub room and VC for sequencing bathing body pats. Pt requires MAX A for dressing UB and LB with poor problem solving, initiation and visual spatial awareness. Pt sit to stand from TTB for OT to pull pants past hips with MIN A. Exited session with pt seated in bed, exit alarm on and call light in reach   Therapy Documentation Precautions:  Precautions Precautions: Fall Required Braces or Orthoses: Other Brace Other Brace: L wrist cock up splint to be worn during day per RN - per [facility] care chart (did not have during CIR eval) Restrictions Weight Bearing Restrictions: No General:   Vital Signs: Therapy Vitals Temp: 98 F (36.7 C) Pulse Rate: 74 Resp: 18 BP: 120/78 Patient Position (if appropriate): Lying Oxygen Therapy SpO2: 98 % O2  Device: Room Air Pain:   ADL: ADL Eating: Moderate assistance Where Assessed-Eating: Bed level Grooming: Moderate assistance Where Assessed-Grooming: Edge of bed Upper Body Bathing: Maximal assistance Where Assessed-Upper Body Bathing: Edge of bed Lower Body Bathing: Dependent Where Assessed-Lower Body Bathing: Bed level Where Assessed-Upper Body Dressing: Edge of bed Lower Body Dressing: Dependent Where Assessed-Lower Body Dressing: Bed level Toileting: Dependent Toilet Transfer: Not assessed Tub/Shower Transfer: Not assessed Vision   Perception    Praxis   Exercises:   Other Treatments:     Therapy/Group: Individual Therapy  Tonny Branch 03/24/2020, 4:17 PM

## 2020-03-24 NOTE — Progress Notes (Signed)
[place] PHYSICAL MEDICINE & REHABILITATION PROGRESS NOTE  Subjective/Complaints: Patient seen sitting up in his chair working with therapy this morning.  He states he slept well overnight.  No reported issues overnight.  He notes improving hip pain.  Therapies note some discomfort with transfer this morning.   ROS: limited due to cognition, but appears to deny CP, shortness of breath, nausea, vomiting, diarrhea.  Objective: Vital Signs: Blood pressure 104/83, pulse 88, temperature 98.3 F (36.8 C), resp. rate 19, height 5\' 10"  (1.778 m), weight 91 kg, SpO2 97 %. No results found. No results for input(s): WBC, HGB, HCT, PLT in the last 72 hours. No results for input(s): NA, K, CL, CO2, GLUCOSE, BUN, CREATININE, CALCIUM in the last 72 hours.  Intake/Output Summary (Last 24 hours) at 03/24/2020 1232 Last data filed at 03/24/2020 0900 Gross per 24 hour  Intake 488 ml  Output --  Net 488 ml     Pressure Injury 02/23/20 Heel Left Deep Tissue Pressure Injury - Purple or maroon localized area of discolored intact skin or blood-filled blister due to damage of underlying soft tissue from pressure and/or shear. (Active)  02/23/20 0800  Location: Heel  Location Orientation: Left  Staging: Deep Tissue Pressure Injury - Purple or maroon localized area of discolored intact skin or blood-filled blister due to damage of underlying soft tissue from pressure and/or shear.  Wound Description (Comments):   Present on Admission: No    Physical Exam: BP 104/83 (BP Location: Right Arm)   Pulse 88   Temp 98.3 F (36.8 C)   Resp 19   Ht 5\' 10"  (1.778 m)   Wt 91 kg   SpO2 97%   BMI 28.79 kg/m  Constitutional: No distress . Vital signs reviewed. HENT: Normocephalic.  Atraumatic. Eyes: EOMI. No discharge. Cardiovascular: No JVD.  RRR. Respiratory: Normal effort.  No stridor.  Bilateral clear to auscultation. GI: Non-distended.  BS +. Skin: Warm and dry.  Intact. Psych: Slowed.  Confused.    Musc: No edema in extremities.  No tenderness in extremities. Neuro: Alert and oriented to person and place. Motor: 4-/5 throughout with apraxia, some improvement Poor attention/concentration  Assessment/Plan: 1. Functional deficits which require 3+ hours per day of interdisciplinary therapy in a comprehensive inpatient rehab setting.  Physiatrist is providing close team supervision and 24 hour management of active medical problems listed below.  Physiatrist and rehab team continue to assess barriers to discharge/monitor patient progress toward functional and medical goals   Care Tool:  Bathing    Body parts bathed by patient: Chest,Face,Right upper leg,Left upper leg,Right arm,Left arm,Abdomen,Front perineal area,Left lower leg   Body parts bathed by helper: Buttocks,Right lower leg     Bathing assist Assist Level: Minimal Assistance - Patient > 75%     Upper Body Dressing/Undressing Upper body dressing   What is the patient wearing?: Pull over shirt    Upper body assist Assist Level: Moderate Assistance - Patient 50 - 74%    Lower Body Dressing/Undressing Lower body dressing      What is the patient wearing?: Underwear/pull up,Pants     Lower body assist Assist for lower body dressing: Maximal Assistance - Patient 25 - 49%     Toileting Toileting    Toileting assist Assist for toileting: Dependent - Patient 0%     Transfers Chair/bed transfer  Transfers assist     Chair/bed transfer assist level: Moderate Assistance - Patient 50 - 74%     Locomotion Ambulation  Ambulation assist   Ambulation activity did not occur: Safety/medical concerns (fatigue, poor motor control/planning, weakness)  Assist level: 2 helpers Assistive device: Walker-rolling Max distance: 37ft   Walk 10 feet activity   Assist  Walk 10 feet activity did not occur: Safety/medical concerns (fatigue, poor motor control/planning, weakness)  Assist level: 2 helpers Assistive  device: Walker-rolling   Walk 50 feet activity   Assist Walk 50 feet with 2 turns activity did not occur: Safety/medical concerns (fatigue, poor motor control/planning, weakness)         Walk 150 feet activity   Assist Walk 150 feet activity did not occur: Safety/medical concerns (fatigue, poor motor control/planning, weakness)         Walk 10 feet on uneven surface  activity   Assist Walk 10 feet on uneven surfaces activity did not occur: Safety/medical concerns (fatigue, poor motor control/planning, weakness)         Wheelchair     Assist Will patient use wheelchair at discharge?: Yes Type of Wheelchair: Manual    Wheelchair assist level: Moderate Assistance - Patient 50 - 74% Max wheelchair distance: 12ft    Wheelchair 50 feet with 2 turns activity    Assist        Assist Level: Maximal Assistance - Patient 25 - 49%   Wheelchair 150 feet activity     Assist      Assist Level: Total Assistance - Patient < 25%    Medical Problem List and Plan: 1.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 Antithrombotics: -DVT/anticoagulation:Pharmaceutical:Lovenox -antiplatelet therapy: N/A 3. Pain Management:  Reported with activity per therapies  Tylenol scheduled on 1/26  Appears relatively controlled on 1/27 4. Mood:LCSW to follow for evaluation and support. -antipsychotic agents: See #11. 5. Neuropsych: This patientis notcapable of making decisions onhisown behalf. 6. Skin/Wound Care:Routine pressure relief measures. 7. Fluids/Electrolytes/Nutrition:Monitor I/Os.  8. T2DM with hyperglycemia: New diagnosis with A1C-7.1 on 1/19.  Carb modified diet.   Continue Levemir with metformin and meal coverage.   Use SSI for elevated BS  Controlled on 1/27  Monitor with increased mobility. 9.Acute blood loss anemia:Resolved  Hemoglobin 13.0 on 1/19 10.  Elevated Triglycerides: 294 on 1/19 11. Psychosis d/o Schizophrenia. Last documented admission 11/20 (history of non-compliance)  Currently off Abiligy, Cogentin and Risperdal during hospitalization.   Continue Seroquel 100 nightly  Decreased Seroquel to 50 daily on 1/20 to improve daytime alertness, monitor Sleep wake chart.  Appreciate psych recs-started on fluoxetine  Appears to be stable on 1/27 12.  Elevated Creatinine: Resolved  Continue to monitor 13. Hyponatremia  Na 133 on 1/24, monitor with addition of fluoxetine, labs ordered for tomorrow  Continue to monitor 14.  Acute blood loss anemia  Hemoglobin 12.5 on 1/24, continue to monitor 15. Visual deficits  Likely secondary to attention +/- diabetic  LOS: 9 days A FACE TO FACE EVALUATION WAS PERFORMED  Eugune Sine Karis Juba 03/24/2020, 12:32 PM

## 2020-03-24 NOTE — Progress Notes (Signed)
Hypoglycemic Event  CBG: 66  Treatment: 4 oz juice/soda  Symptoms: None  Follow-up CBG: Time:2117 CBG Result:71  Possible Reasons for Event: Unknown  Comments/MD notified: Deatra Ina  PA notified    Langley Gauss

## 2020-03-25 LAB — BASIC METABOLIC PANEL
Anion gap: 11 (ref 5–15)
BUN: 13 mg/dL (ref 6–20)
CO2: 24 mmol/L (ref 22–32)
Calcium: 10.1 mg/dL (ref 8.9–10.3)
Chloride: 101 mmol/L (ref 98–111)
Creatinine, Ser: 0.94 mg/dL (ref 0.61–1.24)
GFR, Estimated: >60 mL/min (ref >60)
Glucose, Bld: 157 mg/dL — ABNORMAL HIGH (ref 70–99)
Potassium: 4.1 mmol/L (ref 3.5–5.1)
Sodium: 136 mmol/L (ref 135–145)

## 2020-03-25 LAB — GLUCOSE, CAPILLARY
Glucose-Capillary: 115 mg/dL — ABNORMAL HIGH (ref 70–99)
Glucose-Capillary: 102 mg/dL — ABNORMAL HIGH (ref 70–99)
Glucose-Capillary: 100 mg/dL — ABNORMAL HIGH (ref 70–99)
Glucose-Capillary: 84 mg/dL (ref 70–99)

## 2020-03-25 NOTE — Progress Notes (Signed)
Occupational Therapy Note  Patient Details  Name: Bryan [name] MRN: [id number] Date of Birth: Jul 04, 1975  Today's Date: 03/25/2020 OT Missed Time: 30 Minutes Missed Time Reason: Patient fatigue  Pt missed 30 mins scheduled OT treatment session.  Pt asleep upon arrival, declining therapy session due to fatigue.  Attempted to encourage activity, even offering bed level to pt refusal.   Catalina Salasar, [facility] 03/25/2020, 4:08 PM

## 2020-03-25 NOTE — Progress Notes (Signed)
Speech Language Pathology Daily Session Note  Patient Details  Name: Bryan [name] MRN: [id number] Date of Birth: 1975/08/11  Today's Date: 03/25/2020 SLP Individual Time: 6546-5035 SLP Individual Time Calculation (min): 45 min  Short Term Goals: Week 2: SLP Short Term Goal 1 (Week 2): Patient will orient to date/approximate time with min A cues for using visual/orientation aides. SLP Short Term Goal 2 (Week 2): Patient will recall at least 3 different tasks completed during day (in therapy/nursing care) with mod A cues SLP Short Term Goal 3 (Week 2): Patient will perform basic level ADL's (feeding-self during meals, etc) and problem solving tasks with mod A cues. SLP Short Term Goal 4 (Week 2): Patient will initiate during structured conversation and/or simulated tasks with mod A cues. SLP Short Term Goal 5 (Week 2): Patient will demonstrate awareness to basic level errors during functional and simulated ADL tasks with min A SLP Short Term Goal 6 (Week 2): Pt will demonstrate sustained attention to basic tasks in 5 minute intervals with mod A verbal cues for redirection.  Skilled Therapeutic Interventions:  Patient seen for skilled ST session focusing on cognitive function goals. Patient able to brush teeth and wash face with SLP setup and verbal and tactile cues to initiate. Patient continues with decreased fine motor control of UE's but is able to perform tasks with modA. Patient was less robotic with his speech and described "in the corners of my eyes is blurry". He was able to perform hands-on task of pvc pipe pattern assembly with modA. Patient continues to benefit from skilled SLP intervention to maximize cognitive-linguistic function prior to discharge.  Pain Pain Assessment Pain Scale: 0-10 Pain Score: 0-No pain  Therapy/Group: Individual Therapy   Angela Nevin, MA, CCC-SLP Speech Therapy

## 2020-03-25 NOTE — Progress Notes (Signed)
Occupational Therapy Session Note  Patient Details  Name: Bryan [name] MRN: [id number] Date of Birth: [birth year]  Today's Date: 03/25/2020 OT Individual Time: 1275-1700 OT Individual Time Calculation (min): 43 min    Short Term Goals: Week 2:  OT Short Term Goal 1 (Week 2): Pt will complete toilet transfers with mod assist of one caregiver 3 out of 5 times to demonstrate increased carryover OT Short Term Goal 2 (Week 2): Pt will complete LB dressing with mod assist at sit > stand level OT Short Term Goal 3 (Week 2): Pt will complete UB dressing with min assist OT Short Term Goal 4 (Week 2): Pt will complete toileting task with max assist of one caregiver  Skilled Therapeutic Interventions/Progress Updates:    Treatment session with focus on visual scanning and functional use of BUE.  Pt received upright in w/c having recently completed PT session. Pt reports fatigue but agreeable to therapy session. Engaged in visual scanning activity with focus on scanning to items in periphery as well as midline, pt reports items "change colors" in various visual planes however with further assessment pt able to correctly identify color in multiple positions. Attempted to replicate simple ABAB pattern with clothespins, however pt unable to visually discriminate location of horizontal dowel therefore transitioned to pegboard.  Pt requiring increased time and encouragement to visually scan to L and R to locate pegs and then copy pattern.  Pt requiring additional cues with focus on scanning to therapist finger placement.  Pt returned to room and completed stand pivot transfer min-mod assist and mod multimodal cues for sequencing of transfer.  Pt returned to supine with Min assist and left supine in bed with all needs in reach.  Therapy Documentation Precautions:  Precautions Precautions: Fall Required Braces or Orthoses: Other Brace Other Brace: L wrist cock up splint to be worn during day per RN - per [facility]  care chart (did not have during CIR eval) Restrictions Weight Bearing Restrictions: No General:   Vital Signs: Therapy Vitals Temp: 98.1 F (36.7 C) Temp Source: Oral Pulse Rate: 74 Resp: 19 BP: 102/71 Patient Position (if appropriate): Lying Oxygen Therapy SpO2: 93 % O2 Device: Room Air Pain:  Pt with no c/o pain   Therapy/Group: Individual Therapy  Rosalio Loud 03/25/2020, 4:00 PM

## 2020-03-25 NOTE — Progress Notes (Signed)
[place] PHYSICAL MEDICINE & REHABILITATION PROGRESS NOTE  Subjective/Complaints: Patient seen laying in bed this morning working with therapies.  States he slept well overnight.  No reported issues.   ROS: limited due to cognition, but appears to deny CP, shortness of breath, nausea, vomiting, diarrhea.  Objective: Vital Signs: Blood pressure 103/78, pulse 71, temperature 98.7 F (37.1 C), resp. rate 18, height 5\' 10"  (1.778 m), weight 91 kg, SpO2 99 %. No results found. No results for input(s): WBC, HGB, HCT, PLT in the last 72 hours. Recent Labs    03/25/20 0908  NA 136  K 4.1  CL 101  CO2 24  GLUCOSE 157*  BUN 13  CREATININE 0.94  CALCIUM 10.1    Intake/Output Summary (Last 24 hours) at 03/25/2020 1123 Last data filed at 03/25/2020 0700 Gross per 24 hour  Intake 400 ml  Output 300 ml  Net 100 ml     Pressure Injury 02/23/20 Heel Left Deep Tissue Pressure Injury - Purple or maroon localized area of discolored intact skin or blood-filled blister due to damage of underlying soft tissue from pressure and/or shear. (Active)  02/23/20 0800  Location: Heel  Location Orientation: Left  Staging: Deep Tissue Pressure Injury - Purple or maroon localized area of discolored intact skin or blood-filled blister due to damage of underlying soft tissue from pressure and/or shear.  Wound Description (Comments):   Present on Admission: No    Physical Exam: BP 103/78 (BP Location: Left Arm)   Pulse 71   Temp 98.7 F (37.1 C)   Resp 18   Ht 5\' 10"  (1.778 m)   Wt 91 kg   SpO2 99%   BMI 28.79 kg/m  Constitutional: No distress . Vital signs reviewed. HENT: Normocephalic.  Atraumatic. Eyes: EOMI. No discharge. Cardiovascular: No JVD.  RRR. Respiratory: Normal effort.  No stridor.  Bilateral clear to auscultation. GI: Non-distended.  BS +. Skin: Warm and dry.  Intact. Psych: Alert.  Confused. Musc: No edema in extremities.  No tenderness in extremities. Neuro: Alert and  oriented to person and place only. Motor: 4-/5 throughout with apraxia, relatively stable Poor attention/concentration  Assessment/Plan: 1. Functional deficits which require 3+ hours per day of interdisciplinary therapy in a comprehensive inpatient rehab setting.  Physiatrist is providing close team supervision and 24 hour management of active medical problems listed below.  Physiatrist and rehab team continue to assess barriers to discharge/monitor patient progress toward functional and medical goals   Care Tool:  Bathing    Body parts bathed by patient: Chest,Face,Right upper leg,Left upper leg,Right arm,Left arm,Abdomen,Front perineal area,Left lower leg   Body parts bathed by helper: Buttocks,Right lower leg     Bathing assist Assist Level: Minimal Assistance - Patient > 75%     Upper Body Dressing/Undressing Upper body dressing   What is the patient wearing?: Pull over shirt    Upper body assist Assist Level: Moderate Assistance - Patient 50 - 74%    Lower Body Dressing/Undressing Lower body dressing      What is the patient wearing?: Underwear/pull up,Pants     Lower body assist Assist for lower body dressing: Maximal Assistance - Patient 25 - 49%     Toileting Toileting    Toileting assist Assist for toileting: Dependent - Patient 0%     Transfers Chair/bed transfer  Transfers assist     Chair/bed transfer assist level: Moderate Assistance - Patient 50 - 74%     Locomotion Ambulation   Ambulation assist  Ambulation activity did not occur: Safety/medical concerns (fatigue, poor motor control/planning, weakness)  Assist level: 2 helpers Assistive device: Walker-rolling Max distance: 71ft   Walk 10 feet activity   Assist  Walk 10 feet activity did not occur: Safety/medical concerns (fatigue, poor motor control/planning, weakness)  Assist level: 2 helpers Assistive device: Walker-rolling   Walk 50 feet activity   Assist Walk 50 feet  with 2 turns activity did not occur: Safety/medical concerns (fatigue, poor motor control/planning, weakness)         Walk 150 feet activity   Assist Walk 150 feet activity did not occur: Safety/medical concerns (fatigue, poor motor control/planning, weakness)         Walk 10 feet on uneven surface  activity   Assist Walk 10 feet on uneven surfaces activity did not occur: Safety/medical concerns (fatigue, poor motor control/planning, weakness)         Wheelchair     Assist Will patient use wheelchair at discharge?: Yes Type of Wheelchair: Manual    Wheelchair assist level: Moderate Assistance - Patient 50 - 74% Max wheelchair distance: 54ft    Wheelchair 50 feet with 2 turns activity    Assist        Assist Level: Maximal Assistance - Patient 25 - 49%   Wheelchair 150 feet activity     Assist      Assist Level: Total Assistance - Patient < 25%    Medical Problem List and Plan: 1.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 Antithrombotics: -DVT/anticoagulation:Pharmaceutical:Lovenox -antiplatelet therapy: N/A 3. Pain Management:  Reported with activity per therapies  Tylenol scheduled on 1/26  Appears relatively controlled on 1/28 4. Mood:LCSW to follow for evaluation and support. -antipsychotic agents: See #11. 5. Neuropsych: This patientis notcapable of making decisions onhisown behalf. 6. Skin/Wound Care:Routine pressure relief measures. 7. Fluids/Electrolytes/Nutrition:Monitor I/Os.  8. T2DM with hyperglycemia: New diagnosis with A1C-7.1 on 1/19.  Carb modified diet.   Continue Levemir with metformin and meal coverage.   Use SSI for elevated BS  Controlled on 1/28  Monitor with increased mobility. 9.Acute blood loss anemia:Resolved  Hemoglobin 13.0 on 1/19 10. Elevated Triglycerides: 294 on 1/19 11. Psychosis d/o Schizophrenia. Last  documented admission 11/20 (history of non-compliance)  Currently off Abiligy, Cogentin and Risperdal during hospitalization.   Continue Seroquel 100 nightly  Decreased Seroquel to 50 daily on 1/20 to improve daytime alertness, monitor Sleep wake chart.  Appreciate psych recs-started on fluoxetine  Appears to be stable on 1/28 12.  Elevated Creatinine: Resolved  Continue to monitor 13. Hyponatremia  Na 136 on 1/28  Continue to monitor 14.  Acute blood loss anemia  Hemoglobin 12.5 on 1/24, continue to monitor, labs ordered for Monday 15. Visual deficits  Likely secondary to attention +/- diabetic  LOS: 10 days A FACE TO FACE EVALUATION WAS PERFORMED  Ankit Karis Juba 03/25/2020, 11:23 AM

## 2020-03-25 NOTE — Progress Notes (Signed)
Physical Therapy Session Note  Patient Details  Name: Bryan [name] MRN: [id number] Date of Birth: 1975-03-24  Today's Date: 03/25/2020 PT Individual Time: 0900-1009 PT Individual Time Calculation (min): 69 min   Short Term Goals: Week 1:  PT Short Term Goal 1 (Week 1): pt will perform all bed mobility with min A overall PT Short Term Goal 1 - Progress (Week 1): Progressing toward goal PT Short Term Goal 2 (Week 1): pt will transfer sit<>stand with LRAD and min A PT Short Term Goal 2 - Progress (Week 1): Progressing toward goal PT Short Term Goal 3 (Week 1): Pt will initiate gait training with LRAD PT Short Term Goal 3 - Progress (Week 1): Met Week 2:  PT Short Term Goal 1 (Week 2): pt will perform all bed mobility with min A overall PT Short Term Goal 2 (Week 2): pt will transfer sit<>stand with LRAD and min A PT Short Term Goal 3 (Week 2): pt will ambulate 55f with LRAD and mod A of 1  Skilled Therapeutic Interventions/Progress Updates:   Received pt sitting upright in bed, pt agreeable to therapy, and denied any pain during session. Session with emphasis on functional mobility/transfers, generalized strengthening, dynamic standing balance/coordination, gait training, NMR, motor control/sequencing, and improved activity tolerance. MD present for morning rounds and laboratory present to draw blood. Pt transferred supine<>sitting EOB with CGA and cues for technique. Pt transferred bed<>WC stand<>pivot with RW and mod A with cues for hand placement on RW and bed and cues for turning technique. Pt transported to therapy gym for time management purposes and ambulated 143fwith RW and min/mod A with +2 for WC follow. Pt demonstrated decreased cadence, BLE ataxia, decreased step length, and poor motor planning with mod cues for turning and sitting on mat to avoid plopping down. Therapist asked pt what time it was and he said 4pm; therapist oriented pt to current time. Worked on dynamic standing  balance performing alternating toe taps to 3in step 2x10 with BUE support on RW and min A for balance. Pt reported less R hip pain with activity today compared to previous sessions. Transitioned to alternating tapping LE to 2 colored dots on 3in step based off therapist's cues. Pt able to correctly tap LE to colored dot 2/5 trials prior to requesting to sit stating "my body feels heavy." Pt with difficulty associating L versus R LE. Performed activity again with 0/5 successful attempts. Pt returned to sitting stating "I'm tired." Pt required multiple extended rest breaks throughout session due to fatigue. Engaged in dynamic sitting balance, reaching outside BOS, and trunk control tossing horseshoes x 3 trials with close supervision/CGA for balance as pt with anterior lean while throwing; pt able to correct with cues. Worked on dynamic sitting balance, fine motor control, perceptual awareness, visual scanning, and motor planning placing pegs in holes with supervision for balance. Pt stated "my arm hurts" and required rest break during activity. Pt required min cues for placement of pegs as pt missed 1 hole. Pt able to place 6 pegs but then reported his "neck felt heavy" and wanted to "be done." Stand<>pivot mat<>WC with RW and mod A and [facility]. Concluded session with pt sitting in WC, needs within reach, and seatbelt alarm on awaiting OT session.   Therapy Documentation Precautions:  Precautions Precautions: Fall Required Braces or Orthoses: Other Brace Other Brace: L wrist cock up splint to be worn during day per RN - per [facility] care  chart (did not have during CIR eval) Restrictions Weight Bearing Restrictions: No  Therapy/Group: Individual Therapy Alfonse Alpers PT, DPT   03/25/2020, 7:19 AM

## 2020-03-26 LAB — GLUCOSE, CAPILLARY
Glucose-Capillary: 87 mg/dL (ref 70–99)
Glucose-Capillary: 94 mg/dL (ref 70–99)
Glucose-Capillary: 75 mg/dL (ref 70–99)
Glucose-Capillary: 85 mg/dL (ref 70–99)

## 2020-03-26 NOTE — Progress Notes (Signed)
[place] PHYSICAL MEDICINE & REHABILITATION PROGRESS NOTE  Subjective/Complaints:  Sleeping but awakens to voice and follows commands  ROS: limited due to cognition, but appears to deny CP, shortness of breath, nausea, vomiting, diarrhea.  Objective: Vital Signs: Blood pressure 125/80, pulse 84, temperature 98.5 F (36.9 C), resp. rate 17, height 5\' 10"  (1.778 m), weight 91 kg, SpO2 94 %. No results found. No results for input(s): WBC, HGB, HCT, PLT in the last 72 hours. Recent Labs    03/25/20 0908  NA 136  K 4.1  CL 101  CO2 24  GLUCOSE 157*  BUN 13  CREATININE 0.94  CALCIUM 10.1    Intake/Output Summary (Last 24 hours) at 03/26/2020 1305 Last data filed at 03/26/2020 0700 Gross per 24 hour  Intake 400 ml  Output -  Net 400 ml     Pressure Injury 02/23/20 Heel Left Deep Tissue Pressure Injury - Purple or maroon localized area of discolored intact skin or blood-filled blister due to damage of underlying soft tissue from pressure and/or shear. (Active)  02/23/20 0800  Location: Heel  Location Orientation: Left  Staging: Deep Tissue Pressure Injury - Purple or maroon localized area of discolored intact skin or blood-filled blister due to damage of underlying soft tissue from pressure and/or shear.  Wound Description (Comments):   Present on Admission: No    Physical Exam: BP 125/80   Pulse 84   Temp 98.5 F (36.9 C)   Resp 17   Ht 5\' 10"  (1.778 m)   Wt 91 kg   SpO2 94%   BMI 28.79 kg/m   General: No acute distress Mood and affect are appropriate Heart: Regular rate and rhythm no rubs murmurs or extra sounds Lungs: Clear to auscultation, breathing unlabored, no rales or wheezes Abdomen: Positive bowel sounds, soft nontender to palpation, nondistended Extremities: No clubbing, cyanosis, or edema Skin: No evidence of breakdown, no evidence of rash   Psych: Alert.  Confused. Musc: No edema in extremities.  No tenderness in extremities. Neuro: Alert and  oriented to person and place only. Motor: 4-/5 throughout with apraxia, relatively stable Poor attention/concentration  Assessment/Plan: 1. Functional deficits which require 3+ hours per day of interdisciplinary therapy in a comprehensive inpatient rehab setting.  Physiatrist is providing close team supervision and 24 hour management of active medical problems listed below.  Physiatrist and rehab team continue to assess barriers to discharge/monitor patient progress toward functional and medical goals   Care Tool:  Bathing    Body parts bathed by patient: Chest,Face,Right upper leg,Left upper leg,Right arm,Left arm,Abdomen,Front perineal area,Left lower leg   Body parts bathed by helper: Buttocks,Right lower leg     Bathing assist Assist Level: Minimal Assistance - Patient > 75%     Upper Body Dressing/Undressing Upper body dressing   What is the patient wearing?: Pull over shirt    Upper body assist Assist Level: Moderate Assistance - Patient 50 - 74%    Lower Body Dressing/Undressing Lower body dressing      What is the patient wearing?: Underwear/pull up,Pants     Lower body assist Assist for lower body dressing: Maximal Assistance - Patient 25 - 49%     Toileting Toileting    Toileting assist Assist for toileting: Dependent - Patient 0%     Transfers Chair/bed transfer  Transfers assist     Chair/bed transfer assist level: Moderate Assistance - Patient 50 - 74%     Locomotion Ambulation   Ambulation assist   Ambulation  activity did not occur: Safety/medical concerns (fatigue, poor motor control/planning, weakness)  Assist level: 2 helpers Assistive device: Walker-rolling Max distance: 89ft   Walk 10 feet activity   Assist  Walk 10 feet activity did not occur: Safety/medical concerns (fatigue, poor motor control/planning, weakness)  Assist level: 2 helpers Assistive device: Walker-rolling   Walk 50 feet activity   Assist Walk 50 feet  with 2 turns activity did not occur: Safety/medical concerns (fatigue, poor motor control/planning, weakness)         Walk 150 feet activity   Assist Walk 150 feet activity did not occur: Safety/medical concerns (fatigue, poor motor control/planning, weakness)         Walk 10 feet on uneven surface  activity   Assist Walk 10 feet on uneven surfaces activity did not occur: Safety/medical concerns (fatigue, poor motor control/planning, weakness)         Wheelchair     Assist Will patient use wheelchair at discharge?: Yes Type of Wheelchair: Manual    Wheelchair assist level: Moderate Assistance - Patient 50 - 74% Max wheelchair distance: 55ft    Wheelchair 50 feet with 2 turns activity    Assist        Assist Level: Maximal Assistance - Patient 25 - 49%   Wheelchair 150 feet activity     Assist      Assist Level: Total Assistance - Patient < 25%    Medical Problem List and Plan: 1.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 Antithrombotics: -DVT/anticoagulation:Pharmaceutical:Lovenox -antiplatelet therapy: N/A 3. Pain Management:  Reported with activity per therapies  Tylenol scheduled on 1/26  Appears relatively controlled on 1/28 4. Mood:LCSW to follow for evaluation and support. -antipsychotic agents: See #11. 5. Neuropsych: This patientis notcapable of making decisions onhisown behalf. 6. Skin/Wound Care:Routine pressure relief measures. 7. Fluids/Electrolytes/Nutrition:Monitor I/Os.  8. T2DM with hyperglycemia: New diagnosis with A1C-7.1 on 1/19.  Carb modified diet.   Continue Levemir with metformin and meal coverage.   Use SSI for elevated BS  Controlled on 1/29   CBG (last 3)  Recent Labs    03/25/20 2057 03/26/20 0603 03/26/20 1124  GLUCAP 84 87 94    9.Acute blood loss anemia:Resolved  Hemoglobin 13.0 on  1/19 10. Elevated Triglycerides: 294 on 1/19 11. Psychosis d/o Schizophrenia. Last documented admission 11/20 (history of non-compliance)  Currently off Abiligy, Cogentin and Risperdal during hospitalization.   Continue Seroquel 100 nightly  Decreased Seroquel to 50 daily on 1/20 to improve daytime alertness, monitor Sleep wake chart.  Appreciate psych recs-started on fluoxetine  Appears to be stable on 1/28 12.  Elevated Creatinine: Resolved  Continue to monitor 13. Hyponatremia  Na 136 on 1/28  Continue to monitor 14.  Acute blood loss anemia  Hemoglobin 12.5 on 1/24, continue to monitor, labs ordered for Monday 15. Visual deficits  Likely secondary to attention +/- diabetic  LOS: 11 days A FACE TO FACE EVALUATION WAS PERFORMED  Erick Colace 03/26/2020, 1:05 PM

## 2020-03-26 NOTE — Progress Notes (Signed)
Occupational Therapy Session Note  Patient Details  Name: Bryan [name] MRN: [id number] Date of Birth: 12-15-1975  Today's Date: 03/26/2020 OT Individual Time: 5974-1638 OT Individual Time Calculation (min): 23 min    Short Term Goals: Week 1:  OT Short Term Goal 1 (Week 1): Pt will complete bathing with mod assist OT Short Term Goal 1 - Progress (Week 1): Met OT Short Term Goal 2 (Week 1): Pt will complete UB dressing with mod assist OT Short Term Goal 2 - Progress (Week 1): Met OT Short Term Goal 3 (Week 1): Pt will complete LB dressing with max assist of one caregiver at sit > stand level OT Short Term Goal 3 - Progress (Week 1): Met OT Short Term Goal 4 (Week 1): Pt will complete toilet transfer with max assist of one caregiver OT Short Term Goal 4 - Progress (Week 1): Met  Skilled Therapeutic Interventions/Progress Updates:    1:1. Pt received in bed agreeable to OT after encouragement. Pt reporting being tired and BLE (thighs) hurt-muscle related pain per pt. Pt requires MAX VC for sequencing sup>sitting EOB. Pt completes SPT with RW with MOD A for power up and VC for hand placement and steadying of walker d/t ataxia. Pt completes grooming (face washing and oral care seated in w/c in room with total cueing for sequencing d/t poor processing and motor planning. Exited session with pt seated in w/c, exit alarm on and call light in reach   Therapy Documentation Precautions:  Precautions Precautions: Fall Required Braces or Orthoses: Other Brace Other Brace: L wrist cock up splint to be worn during day per RN - per [facility] care chart (did not have during CIR eval) Restrictions Weight Bearing Restrictions: No General:   Vital Signs: Therapy Vitals Temp: 98.5 F (36.9 C) Pulse Rate: 74 Resp: 17 BP: 112/84 Patient Position (if appropriate): Lying Oxygen Therapy SpO2: 94 % O2 Device: Room Air Pain:   ADL: ADL Eating: Moderate assistance Where Assessed-Eating: Bed  level Grooming: Moderate assistance Where Assessed-Grooming: Edge of bed Upper Body Bathing: Maximal assistance Where Assessed-Upper Body Bathing: Edge of bed Lower Body Bathing: Dependent Where Assessed-Lower Body Bathing: Bed level Where Assessed-Upper Body Dressing: Edge of bed Lower Body Dressing: Dependent Where Assessed-Lower Body Dressing: Bed level Toileting: Dependent Toilet Transfer: Not assessed Tub/Shower Transfer: Not assessed Vision   Perception    Praxis   Exercises:   Other Treatments:     Therapy/Group: Individual Therapy  Tonny Branch 03/26/2020, 7:02 AM

## 2020-03-27 LAB — GLUCOSE, CAPILLARY
Glucose-Capillary: 94 mg/dL (ref 70–99)
Glucose-Capillary: 73 mg/dL (ref 70–99)
Glucose-Capillary: 71 mg/dL (ref 70–99)
Glucose-Capillary: 105 mg/dL — ABNORMAL HIGH (ref 70–99)

## 2020-03-27 NOTE — Progress Notes (Signed)
Patient up in the chair complained of blurred vision but when he went back to bed he said it was gone. Assessment done but change. We continue to monitor.

## 2020-03-27 NOTE — Progress Notes (Incomplete)
Patient family

## 2020-03-27 NOTE — Progress Notes (Signed)
Physical Therapy Session Note  Patient Details  Name: Bryan [name] MRN: [id number] Date of Birth: 06-28-1975  Today's Date: 03/27/2020 PT Individual Time: 0800-0853 PT Individual Time Calculation (min): 53 min   Short Term Goals: Week 1:  PT Short Term Goal 1 (Week 1): pt will perform all bed mobility with min A overall PT Short Term Goal 1 - Progress (Week 1): Progressing toward goal PT Short Term Goal 2 (Week 1): pt will transfer sit<>stand with LRAD and min A PT Short Term Goal 2 - Progress (Week 1): Progressing toward goal PT Short Term Goal 3 (Week 1): Pt will initiate gait training with LRAD PT Short Term Goal 3 - Progress (Week 1): Met Week 2:  PT Short Term Goal 1 (Week 2): pt will perform all bed mobility with min A overall PT Short Term Goal 2 (Week 2): pt will transfer sit<>stand with LRAD and min A PT Short Term Goal 3 (Week 2): pt will ambulate 78f with LRAD and mod A of 1  Skilled Therapeutic Interventions/Progress Updates:   Received pt sitting upright in bed, pt agreeable to therapy, and denied any pain during session. Session with emphasis on functional mobility/transfers, generalized strengthening, dynamic standing balance/coordination, gait training, NMR, motor control/sequencing, and improved activity tolerance. Pt transferred supine<>sitting EOB with min A and cues for logroll technique but pt with increased difficulty sequencing. Pt transferred stand<>pivot bed<>WC with RW and mod A with cues for hand placement on RW and bed, anterior weight shifting, and turning technique. [facility] for time management purposes and ambulated 139fwith RW and mod A. Pt demonstrates BLE ataxia, decreased cadence, downward gaze, decreased step/stride length, and poor motor planning. Worked on dynamic standing balance performing alternating toe taps to 3in step with BUE support and min A for balance 3x10 reps. Pt with improvements sequencing LLE vs RLE  requiring only min cues today. Worked on lateral side stepping along mat 1258f 1 and [id number] with RW and mod A with total A from therapist to manage RW. Pt with difficulty managing RW and stepping and demonstrating decreased step length resulting in RW hitting the side of his leg. Pt required mod cues for stepping sequence, upright posture/gaze, and RW management. Pt required multiple rest breaks throughout session due to increased fatigue. Attempted to work on visual scanning, spatial awareness, fine motor control clipping clothespins to clothesline, however pt with increased difficulty seeing silver bar to clip pins to despite adjustment of activity; therefore stopped. Instead worked on stacComotop of each other with emphasis on visual scanning, perceptual awareness, and fine motor control. Pt able to correctly identify green legos out of 3 color options and able to stack 3 legos on top of each other with mod cues to use 1UE to stabilize lego while using other UE to place second lego on top of first. Pt required encouragement to continue working activity as pt stated "that's all I can do" multiple times. Stand<>pivot mat<>WC with RW and mod A and pt transported [facility]. Concluded session with pt sitting in WC, needs within reach, and seatbelt alarm on. NT aware of pt's current status.   Therapy Documentation Precautions:  Precautions Precautions: Fall Required Braces or Orthoses: Other Brace Other Brace: L wrist cock up splint to be worn during day per RN - per [facility] care chart (did not have during CIR eval) Restrictions Weight Bearing Restrictions: No  Therapy/Group: Individual Therapy Alfonse Alpers PT, DPT   03/27/2020, 7:15 AM

## 2020-03-28 LAB — CBC WITH DIFFERENTIAL/PLATELET
Abs Immature Granulocytes: 0.01 10*3/uL (ref 0.00–0.07)
Basophils Absolute: 0 10*3/uL (ref 0.0–0.1)
Basophils Relative: 1 %
Eosinophils Absolute: 0.1 10*3/uL (ref 0.0–0.5)
Eosinophils Relative: 3 %
HCT: 38.1 % — ABNORMAL LOW (ref 39.0–52.0)
Hemoglobin: 12.6 g/dL — ABNORMAL LOW (ref 13.0–17.0)
Immature Granulocytes: 0 %
Lymphocytes Relative: 44 %
Lymphs Abs: 1.8 10*3/uL (ref 0.7–4.0)
MCH: 30.6 pg (ref 26.0–34.0)
MCHC: 33.1 g/dL (ref 30.0–36.0)
MCV: 92.5 fL (ref 80.0–100.0)
Monocytes Absolute: 0.3 10*3/uL (ref 0.1–1.0)
Monocytes Relative: 8 %
Neutro Abs: 1.8 10*3/uL (ref 1.7–7.7)
Neutrophils Relative %: 44 %
Platelets: 280 10*3/uL (ref 150–400)
RBC: 4.12 MIL/uL — ABNORMAL LOW (ref 4.22–5.81)
RDW: 15.3 % (ref 11.5–15.5)
WBC: 4.1 10*3/uL (ref 4.0–10.5)
nRBC: 0 % (ref 0.0–0.2)

## 2020-03-28 LAB — BASIC METABOLIC PANEL
Anion gap: 9 (ref 5–15)
BUN: 11 mg/dL (ref 6–20)
CO2: 24 mmol/L (ref 22–32)
Calcium: 10.1 mg/dL (ref 8.9–10.3)
Chloride: 102 mmol/L (ref 98–111)
Creatinine, Ser: 0.83 mg/dL (ref 0.61–1.24)
GFR, Estimated: >60 mL/min (ref >60)
Glucose, Bld: 103 mg/dL — ABNORMAL HIGH (ref 70–99)
Potassium: 4.1 mmol/L (ref 3.5–5.1)
Sodium: 135 mmol/L (ref 135–145)

## 2020-03-28 LAB — GLUCOSE, CAPILLARY
Glucose-Capillary: 97 mg/dL (ref 70–99)
Glucose-Capillary: 87 mg/dL (ref 70–99)
Glucose-Capillary: 105 mg/dL — ABNORMAL HIGH (ref 70–99)
Glucose-Capillary: 59 mg/dL — ABNORMAL LOW (ref 70–99)
Glucose-Capillary: 110 mg/dL — ABNORMAL HIGH (ref 70–99)

## 2020-03-28 MED ORDER — INSULIN DETEMIR 100 UNIT/ML ~~LOC~~ SOLN
25.0000 [IU] | Freq: Every day | SUBCUTANEOUS | Status: DC
  Administered 2020-03-29 – 2020-04-08 (×11): 25 [IU] via SUBCUTANEOUS
  Filled 2020-03-28 (×11): qty 0.25

## 2020-03-28 MED ORDER — INSULIN ASPART 100 UNIT/ML ~~LOC~~ SOLN
3.0000 [IU] | Freq: Three times a day (TID) | SUBCUTANEOUS | Status: DC
Start: 2020-03-28 — End: 2020-04-06
  Administered 2020-03-28 – 2020-04-06 (×27): 3 [IU] via SUBCUTANEOUS

## 2020-03-28 NOTE — Progress Notes (Signed)
Physical Therapy Session Note  Patient Details  Name: Bryan [name] MRN: [id number] Date of Birth: 06-09-1975  Today's Date: 03/28/2020 PT Individual Time: 0915-1010 PT Individual Time Calculation (min): 55 min   Short Term Goals: Week 1:  PT Short Term Goal 1 (Week 1): pt will perform all bed mobility with min A overall PT Short Term Goal 1 - Progress (Week 1): Progressing toward goal PT Short Term Goal 2 (Week 1): pt will transfer sit<>stand with LRAD and min A PT Short Term Goal 2 - Progress (Week 1): Progressing toward goal PT Short Term Goal 3 (Week 1): Pt will initiate gait training with LRAD PT Short Term Goal 3 - Progress (Week 1): Met Week 2:  PT Short Term Goal 1 (Week 2): pt will perform all bed mobility with min A overall PT Short Term Goal 2 (Week 2): pt will transfer sit<>stand with LRAD and min A PT Short Term Goal 3 (Week 2): pt will ambulate 6f with LRAD and mod A of 1  Skilled Therapeutic Interventions/Progress Updates:   Received pt supine in bed asleep, pt easily aroused and agreeable to therapy, and denied any pain during session. Session with emphasis on functional mobility/transfers, generalized strengthening, dynamic standing balance/coordination, NMR, motor control/coordination, gait training, and improved activity tolerance. Pt transferred supine<>sitting EOB with supervision and increased time with cues for logroll technique. Pt transferred bed<>WC stand<>pivot with RW and mod A with cues for hand placement on bed and on RW when standing and to reach back for WC armrests when sitting. Pt [facility]o [facility] for time management purposes. Pt transferred sit<>stand with RW and min A and engaged in dynamic standing balance using BITS with emphasis on fine motor control, visual scanning, reaction time, and motor coordination: -visual scanning single target for 3 minutes 16 seconds with 14.81% accuracy. Pt with difficulty reaching with RUE and with  dysmetria bilaterally. However, pt with improvements in visual scanning to identify circles.  -tracing triangle and square. Pt with increased difficulty due to UE ataxia and dysmetria requiring hand over hand assist. However, pt able to direct therapist which way to go to complete shape.  Pt ambulated 221fwith RW and min/mod A. Pt continues to demonstrate BLE ataxia, narrow BOS, decreased step/stride length and difficulty sequencing when turning. As pt approached the mat he stated "which way do I turn?" and required cues for turning sequencing. Pt reported feeling "exhausted" and required multiple extended rest breaks throughout session. Pt ambulated additional 1542f 2 trials to/from simulated car with RW and min A. Pt performed ambulatory simulated car transfer with RW and min A with same cues for turning technique. Worked on technique for standing and pt transferred sit<>stand on [employer] x 5 reps with min/mod A. Pt demonstrated improved carry over with technique requiring less cues, as reps increased, for hand placement on mat and on RW and to reach back prior to sitting. Talked through technique with therapist asking pt where to place hands when standing and sitting. Pt stated "I forgot" and required mod cues to help verbalize technique. Stand<>pivot mat<>WC with RW and min A. Pt [facility]. Concluded session with pt sitting in WC, needs within reach, and seatbelt alarm on.   Therapy Documentation Precautions:  Precautions Precautions: Fall Required Braces or Orthoses: Other Brace Other Brace: L wrist cock up splint to be worn during day per RN - per [facility] care chart (did not have during  CIR eval) Restrictions Weight Bearing Restrictions: No  Therapy/Group: Individual Therapy Alfonse Alpers PT, DPT   03/28/2020, 7:30 AM

## 2020-03-28 NOTE — Progress Notes (Signed)
Hypoglycemic Event  CBG: 59  Treatment: 4 oz juice/soda  Symptoms: None  Follow-up CBG: Time:1718 CBG Result:105  Possible Reasons for Event: Unknown  Comments/MD notified:yes    Bryan [name]  Bryan [name]

## 2020-03-28 NOTE — Progress Notes (Signed)
[place] PHYSICAL MEDICINE & REHABILITATION PROGRESS NOTE  Subjective/Complaints: Patient seen laying in bed this morning.  He states he slept well overnight.  No reported issues overnight.  ROS: limited due to cognition, but appears to deny CP, shortness of breath, nausea, vomiting, diarrhea.  Objective: Vital Signs: Blood pressure 107/84, pulse 76, temperature 98.2 F (36.8 C), resp. rate 17, height 5\' 10"  (1.778 m), weight 91 kg, SpO2 96 %. No results found. Recent Labs    03/28/20 0622  WBC 4.1  HGB 12.6*  HCT 38.1*  PLT 280   Recent Labs    03/28/20 0622  NA 135  K 4.1  CL 102  CO2 24  GLUCOSE 103*  BUN 11  CREATININE 0.83  CALCIUM 10.1    Intake/Output Summary (Last 24 hours) at 03/28/2020 1036 Last data filed at 03/28/2020 0900 Gross per 24 hour  Intake 760 ml  Output --  Net 760 ml     Pressure Injury 02/23/20 Heel Left Deep Tissue Pressure Injury - Purple or maroon localized area of discolored intact skin or blood-filled blister due to damage of underlying soft tissue from pressure and/or shear. (Active)  02/23/20 0800  Location: Heel  Location Orientation: Left  Staging: Deep Tissue Pressure Injury - Purple or maroon localized area of discolored intact skin or blood-filled blister due to damage of underlying soft tissue from pressure and/or shear.  Wound Description (Comments):   Present on Admission: No    Physical Exam: BP 107/84   Pulse 76   Temp 98.2 F (36.8 C)   Resp 17   Ht 5\' 10"  (1.778 m)   Wt 91 kg   SpO2 96%   BMI 28.79 kg/m  Constitutional: No distress . Vital signs reviewed. HENT: Normocephalic.  Atraumatic. Eyes: EOMI. No discharge. Cardiovascular: No JVD.  RRR. Respiratory: Normal effort.  No stridor.  Bilateral clear to auscultation. GI: Non-distended.  BS +. Skin: Warm and dry.  Intact. Psych: Flat.  Confused.   Musc: No edema in extremities.  No tenderness in extremities. Neuro: Alert and oriented to person and place  only. Motor: 4-/5 throughout with apraxia, unchanged Poor attention/concentration  Assessment/Plan: 1. Functional deficits which require 3+ hours per day of interdisciplinary therapy in a comprehensive inpatient rehab setting.  Physiatrist is providing close team supervision and 24 hour management of active medical problems listed below.  Physiatrist and rehab team continue to assess barriers to discharge/monitor patient progress toward functional and medical goals   Care Tool:  Bathing    Body parts bathed by patient: Chest,Face,Right upper leg,Left upper leg,Right arm,Left arm,Abdomen,Front perineal area,Left lower leg   Body parts bathed by helper: Buttocks,Right lower leg     Bathing assist Assist Level: Minimal Assistance - Patient > 75%     Upper Body Dressing/Undressing Upper body dressing   What is the patient wearing?: Pull over shirt    Upper body assist Assist Level: Moderate Assistance - Patient 50 - 74%    Lower Body Dressing/Undressing Lower body dressing      What is the patient wearing?: Underwear/pull up,Pants     Lower body assist Assist for lower body dressing: Maximal Assistance - Patient 25 - 49%     Toileting Toileting    Toileting assist Assist for toileting: Dependent - Patient 0%     Transfers Chair/bed transfer  Transfers assist     Chair/bed transfer assist level: Moderate Assistance - Patient 50 - 74%     Locomotion Ambulation   Ambulation  assist   Ambulation activity did not occur: Safety/medical concerns (fatigue, poor motor control/planning, weakness)  Assist level: Minimal Assistance - Patient > 75% Assistive device: Walker-rolling Max distance: 53ft   Walk 10 feet activity   Assist  Walk 10 feet activity did not occur: Safety/medical concerns (fatigue, poor motor control/planning, weakness)  Assist level: Minimal Assistance - Patient > 75% Assistive device: Walker-rolling   Walk 50 feet activity   Assist  Walk 50 feet with 2 turns activity did not occur: Safety/medical concerns (fatigue, poor motor control/planning, weakness)         Walk 150 feet activity   Assist Walk 150 feet activity did not occur: Safety/medical concerns (fatigue, poor motor control/planning, weakness)         Walk 10 feet on uneven surface  activity   Assist Walk 10 feet on uneven surfaces activity did not occur: Safety/medical concerns (fatigue, poor motor control/planning, weakness)         Wheelchair     Assist Will patient use wheelchair at discharge?: Yes Type of Wheelchair: Manual    Wheelchair assist level: Moderate Assistance - Patient 50 - 74% Max wheelchair distance: 91ft    Wheelchair 50 feet with 2 turns activity    Assist        Assist Level: Maximal Assistance - Patient 25 - 49%   Wheelchair 150 feet activity     Assist      Assist Level: Total Assistance - Patient < 25%    Medical Problem List and Plan: 1.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 Antithrombotics: -DVT/anticoagulation:Pharmaceutical:Lovenox -antiplatelet therapy: N/A 3. Pain Management:  Reported with activity per therapies  Tylenol scheduled on 1/26  Appears relatively controlled on 1/31 4. Mood:LCSW to follow for evaluation and support. -antipsychotic agents: See #11. 5. Neuropsych: This patientis notcapable of making decisions onhisown behalf. 6. Skin/Wound Care:Routine pressure relief measures. 7. Fluids/Electrolytes/Nutrition:Monitor I/Os.  8. T2DM with hyperglycemia: New diagnosis with A1C-7.1 on 1/19.  Carb modified diet.   Continue Levemir with metformin and meal coverage.   Use SSI for elevated BS  Relatively controlled on 1/31   CBG (last 3)  Recent Labs    03/27/20 1620 03/27/20 2052 03/28/20 0556  GLUCAP 71 105* 97    9.Acute blood loss anemia:Resolved  Hemoglobin  13.0 on 1/19 10. Elevated Triglycerides: 294 on 1/19 11. Psychosis d/o Schizophrenia. Last documented admission 11/20 (history of non-compliance)  Currently off Abiligy, Cogentin and Risperdal during hospitalization.   Continue Seroquel 100 nightly  Decreased Seroquel to 50 daily on 1/20 to improve daytime alertness, monitor Sleep wake chart.  Appreciate psych recs-started on fluoxetine  Appears to be stable on 1/31 12.  Elevated Creatinine: Resolved  Continue to monitor 13. Hyponatremia  Na 135 on 1/31  Continue to monitor 14.  Acute blood loss anemia  Hemoglobin 12.6 on 1/31 15. Visual deficits  Likely secondary to attention +/- diabetic  LOS: 13 days A FACE TO FACE EVALUATION WAS PERFORMED  Tobechukwu Emmick Karis Juba 03/28/2020, 10:36 AM

## 2020-03-28 NOTE — Progress Notes (Signed)
Speech Language Pathology Daily Session Note  Patient Details  Name: Bryan [name] MRN: [id number] Date of Birth: Oct 11, 1975  Today's Date: 03/28/2020 SLP Individual Time: 1300-1345 SLP Individual Time Calculation (min): 45 min  Short Term Goals: Week 2: SLP Short Term Goal 1 (Week 2): Patient will orient to date/approximate time with min A cues for using visual/orientation aides. SLP Short Term Goal 2 (Week 2): Patient will recall at least 3 different tasks completed during day (in therapy/nursing care) with mod A cues SLP Short Term Goal 3 (Week 2): Patient will perform basic level ADL's (feeding-self during meals, etc) and problem solving tasks with mod A cues. SLP Short Term Goal 4 (Week 2): Patient will initiate during structured conversation and/or simulated tasks with mod A cues. SLP Short Term Goal 5 (Week 2): Patient will demonstrate awareness to basic level errors during functional and simulated ADL tasks with min A SLP Short Term Goal 6 (Week 2): Pt will demonstrate sustained attention to basic tasks in 5 minute intervals with mod A verbal cues for redirection.  Skilled Therapeutic Interventions: Skilled ST services focused on cognitive skills. SLP communicated with pt's mother via phone to obtain a more accurate view of baseline cognitive skills. Pt completed a 7th grade education of specialized education due to learning differences. Pt was living on and off alone, but most recently with his mother. Pt was primarily home, enjoyed watching tv, tidying up the house and walking to the near by store. [name] provided higher level assistance in money and time management, as well as primary transportation and meal provider. [name] noted improvement in cognitive skills, however skills are not at baseline level, per her report. SLP facilitated recall and basic problem solving answering room phone (pt's mother reported difficulty during hospital stay.) Pt required mod A fade to  supervision A verbal cues over several trials. Pt did demonstrate increase processing and response in simple conversation, compare to last treatment session with Clinical research associate. Pt was left in bed with alarm set and call bell within reach. Recommend to continue ST services.     Pain Pain Assessment Pain Score: 0-No pain  Therapy/Group: Individual Therapy  Charlotta Lapaglia  [facility] 03/28/2020, 4:18 PM

## 2020-03-28 NOTE — Progress Notes (Signed)
Physical Therapy Session Note  Patient Details  Name: Bryan [name] MRN: [id number] Date of Birth: [birth year]  Today's Date: 03/28/2020 PT Individual Time: 1500-1525 PT Individual Time Calculation (min): 25 min   Short Term Goals: Week 1:  PT Short Term Goal 1 (Week 1): pt will perform all bed mobility with min A overall PT Short Term Goal 1 - Progress (Week 1): Progressing toward goal PT Short Term Goal 2 (Week 1): pt will transfer sit<>stand with LRAD and min A PT Short Term Goal 2 - Progress (Week 1): Progressing toward goal PT Short Term Goal 3 (Week 1): Pt will initiate gait training with LRAD PT Short Term Goal 3 - Progress (Week 1): Met  Skilled Therapeutic Interventions/Progress Updates:    Pt received sitting upright in w/c with safety belt alarm on, awake and agreeable to therapy. Reports mild stomach "cramping" but denies request for medical intervention. Provided emotional support and rest breaks during session for relief. Positioned him outside in w/c with totalA for time management. Worked w/c propulsion where he propelled himself ~36f to dayroom gym progressing from CFossto minA due to difficulty maintaining straight path. Wheeled to [employer]where he performed x7 minutes at workload of 50. Provided Metronome at 40 beats/minute to emphasize rhythmic cadence and coordination. Benefited from TC's to assist with initiation during Kinetron stepping. Then performed sit<>stand with minA to RW and performed static standing marching x2 minutes with metronome at 40 beats/minute. Required CGA for steadying while doing this and had difficulty keeping up with cadence. Pt reporting fatigue, requesting to return to bed. Returned him with totalA to his room. Stand<>pivot transfer with minA and cues for stepping pattern and hand placement. Sit>supine with CGA and bed features; remained supine in bed with needs in reach and bed alarm on.  Therapy Documentation Precautions:   Precautions Precautions: Fall Required Braces or Orthoses: Other Brace Other Brace: L wrist cock up splint to be worn during day per RN - per [facility] care chart (did not have during CIR eval) Restrictions Weight Bearing Restrictions: No  Therapy/Group: Individual Therapy  CAlger Simons1/31/2022, 7:57 AM

## 2020-03-28 NOTE — Progress Notes (Signed)
Occupational Therapy Session Note  Patient Details  Name: Bryan [name] MRN: [id number] Date of Birth: 1975-12-17  Today's Date: 03/28/2020 OT Individual Time: 3016-0109 OT Individual Time Calculation (min): 54 min    Short Term Goals: Week 2:  OT Short Term Goal 1 (Week 2): Pt will complete toilet transfers with mod assist of one caregiver 3 out of 5 times to demonstrate increased carryover OT Short Term Goal 2 (Week 2): Pt will complete LB dressing with mod assist at sit > stand level OT Short Term Goal 3 (Week 2): Pt will complete UB dressing with min assist OT Short Term Goal 4 (Week 2): Pt will complete toileting task with max assist of one caregiver  Skilled Therapeutic Interventions/Progress Updates:    Treatment session with focus on sit > stand, dynamic standing balance, and functional use of BUE.  Pt received supine in bed requiring increased time and encouragement to engage in OOB activity.  Pt completed bed mobility min assist to come to sitting EOB.  Engaged in sit > stand from EOB with mod cues for hand placement.  Pt required mod assist sit > stand from EOB, but remaining sit > stand min assist throughout session.  Completed stand pivot transfer bed > w/c with RW with min assist and max multimodal cues for sequencing of transfer.  Engaged in table top activity in standing to continue to address sit > stand and dynamic standing balance while focusing on visual scanning and functional use of BUE.  Pt able to identify block pattern with mod cues and frequent redirection to sustain attention to task.  Therapist challenged pt to replicate pattern, incorporating 2 step task and challenging alternating visual attention from pattern to his replication.  Pt continues to demonstrate decreased visual skills requiring therapist interaction and max cues for sequencing.  Pt min assist for all sit > stand and CGA for standing balance while alternating UE support while engaging in table top task.   Pt required frequent seated rest breaks, able to stand 4-5 mins at a time before requiring seated rest break.  Pt remained seated in w/c with seat belt alarm as next therapy session shortly after this session.  Pt with call bell in reach.  Therapy Documentation Precautions:  Precautions Precautions: Fall Required Braces or Orthoses: Other Brace Other Brace: L wrist cock up splint to be worn during day per RN - per [facility] care chart (did not have during CIR eval) Restrictions Weight Bearing Restrictions: No Pain:  Pt with no c/o pain   Therapy/Group: Individual Therapy  Rosalio Loud 03/28/2020, 3:05 PM

## 2020-03-28 NOTE — Progress Notes (Signed)
Patient's po intake has been good~ 90% per nursing but BS have been ranging from 70 to low 100. Down to high 60's today  Will decrease Levemir by 5 units to 25u  and decrease meal coverage to 3 units.

## 2020-03-29 LAB — GLUCOSE, CAPILLARY
Glucose-Capillary: 92 mg/dL (ref 70–99)
Glucose-Capillary: 90 mg/dL (ref 70–99)
Glucose-Capillary: 65 mg/dL — ABNORMAL LOW (ref 70–99)
Glucose-Capillary: 88 mg/dL (ref 70–99)
Glucose-Capillary: 110 mg/dL — ABNORMAL HIGH (ref 70–99)

## 2020-03-29 NOTE — Progress Notes (Signed)
[place] PHYSICAL MEDICINE & REHABILITATION PROGRESS NOTE  Subjective/Complaints: Patient seen laying in bed this morning.  He states he slept well overnight.  He denies complaints.  He denies hip pain, however discussed with nursing to provide medications due to previous complaints during therapies.  ROS: limited due to cognition, but appears to deny CP, shortness of breath, nausea, vomiting, diarrhea.  Objective: Vital Signs: Blood pressure 110/74, pulse 72, temperature 98.6 F (37 C), resp. rate 17, height 5\' 10"  (1.778 m), weight 91.2 kg, SpO2 95 %. No results found. Recent Labs    03/28/20 0622  WBC 4.1  HGB 12.6*  HCT 38.1*  PLT 280   Recent Labs    03/28/20 0622  NA 135  K 4.1  CL 102  CO2 24  GLUCOSE 103*  BUN 11  CREATININE 0.83  CALCIUM 10.1    Intake/Output Summary (Last 24 hours) at 03/29/2020 0841 Last data filed at 03/29/2020 0730 Gross per 24 hour  Intake 536 ml  Output 350 ml  Net 186 ml     Pressure Injury 02/23/20 Heel Left Deep Tissue Pressure Injury - Purple or maroon localized area of discolored intact skin or blood-filled blister due to damage of underlying soft tissue from pressure and/or shear. (Active)  02/23/20 0800  Location: Heel  Location Orientation: Left  Staging: Deep Tissue Pressure Injury - Purple or maroon localized area of discolored intact skin or blood-filled blister due to damage of underlying soft tissue from pressure and/or shear.  Wound Description (Comments):   Present on Admission: No    Physical Exam: BP 110/74   Pulse 72   Temp 98.6 F (37 C)   Resp 17   Ht 5\' 10"  (1.778 m)   Wt 91.2 kg   SpO2 95%   BMI 28.85 kg/m  Constitutional: No distress . Vital signs reviewed. HENT: Normocephalic.  Atraumatic. Eyes: EOMI. No discharge. Cardiovascular: No JVD.  RRR. Respiratory: Normal effort.  No stridor.  Bilateral clear to auscultation. GI: Non-distended.  BS +. Skin: Warm and dry.  Intact. Psych: Flat.   Confused. Musc: No edema in extremities.  No tenderness in extremities. Neuro: Alert and oriented to person and place only, consistently believes it is 2020. Motor: 4-/5 throughout with apraxia, stable Poor attention/concentration  Assessment/Plan: 1. Functional deficits which require 3+ hours per day of interdisciplinary therapy in a comprehensive inpatient rehab setting.  Physiatrist is providing close team supervision and 24 hour management of active medical problems listed below.  Physiatrist and rehab team continue to assess barriers to discharge/monitor patient progress toward functional and medical goals   Care Tool:  Bathing    Body parts bathed by patient: Chest,Face,Right upper leg,Left upper leg,Right arm,Left arm,Abdomen,Front perineal area,Left lower leg   Body parts bathed by helper: Buttocks,Right lower leg     Bathing assist Assist Level: Minimal Assistance - Patient > 75%     Upper Body Dressing/Undressing Upper body dressing   What is the patient wearing?: Pull over shirt    Upper body assist Assist Level: Moderate Assistance - Patient 50 - 74%    Lower Body Dressing/Undressing Lower body dressing      What is the patient wearing?: Underwear/pull up,Pants     Lower body assist Assist for lower body dressing: Maximal Assistance - Patient 25 - 49%     Toileting Toileting    Toileting assist Assist for toileting: Dependent - Patient 0%     Transfers Chair/bed transfer  Transfers assist  Chair/bed transfer assist level: Moderate Assistance - Patient 50 - 74%     Locomotion Ambulation   Ambulation assist   Ambulation activity did not occur: Safety/medical concerns (fatigue, poor motor control/planning, weakness)  Assist level: Minimal Assistance - Patient > 75% Assistive device: Walker-rolling Max distance: 86ft   Walk 10 feet activity   Assist  Walk 10 feet activity did not occur: Safety/medical concerns (fatigue, poor motor  control/planning, weakness)  Assist level: Minimal Assistance - Patient > 75% Assistive device: Walker-rolling   Walk 50 feet activity   Assist Walk 50 feet with 2 turns activity did not occur: Safety/medical concerns (fatigue, poor motor control/planning, weakness)         Walk 150 feet activity   Assist Walk 150 feet activity did not occur: Safety/medical concerns (fatigue, poor motor control/planning, weakness)         Walk 10 feet on uneven surface  activity   Assist Walk 10 feet on uneven surfaces activity did not occur: Safety/medical concerns (fatigue, poor motor control/planning, weakness)         Wheelchair     Assist Will patient use wheelchair at discharge?: Yes Type of Wheelchair: Manual    Wheelchair assist level: Moderate Assistance - Patient 50 - 74% Max wheelchair distance: 65ft    Wheelchair 50 feet with 2 turns activity    Assist        Assist Level: Maximal Assistance - Patient 25 - 49%   Wheelchair 150 feet activity     Assist      Assist Level: Total Assistance - Patient < 25%    Medical Problem List and Plan: 1.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 Antithrombotics: -DVT/anticoagulation:Pharmaceutical:Lovenox -antiplatelet therapy: N/A 3. Pain Management:  Reported with activity per therapies  Tylenol scheduled on 1/26  Appears relatively controlled on 2/1 4. Mood:LCSW to follow for evaluation and support. -antipsychotic agents: See #11. 5. Neuropsych: This patientis notcapable of making decisions onhisown behalf. 6. Skin/Wound Care:Routine pressure relief measures. 7. Fluids/Electrolytes/Nutrition:Monitor I/Os.  8. T2DM with hyperglycemia: New diagnosis with A1C-7.1 on 1/19.  Carb modified diet.   Levemir decreased to 25 units  NovoLog decreased to 3 units  Metformin and meal coverage.   Use SSI for elevated  BS   CBG (last 3)  Recent Labs    03/28/20 1718 03/28/20 2103 03/29/20 0544  GLUCAP 105* 110* 92    9.Acute blood loss anemia:Resolved  Hemoglobin 13.0 on 1/19 10. Elevated Triglycerides: 294 on 1/19 11. Psychosis d/o Schizophrenia. Last documented admission 11/20 (history of non-compliance)  Currently off Abiligy, Cogentin and Risperdal during hospitalization.   Continue Seroquel 100 nightly  Decreased Seroquel to 50 daily on 1/20 to improve daytime alertness, monitor Sleep wake chart.  Appreciate psych recs-started on fluoxetine  Appears to be stable on 2/1 12.  Elevated Creatinine: Resolved  Continue to monitor 13. Hyponatremia  Na 135 on 1/31  Continue to monitor 14.  Acute blood loss anemia  Hemoglobin 12.6 on 1/31 15. Visual deficits  Likely secondary to attention +/- diabetic  Stable  LOS: 14 days A FACE TO FACE EVALUATION WAS PERFORMED  Eual Lindstrom Karis Juba 03/29/2020, 8:41 AM

## 2020-03-29 NOTE — Progress Notes (Signed)
Occupational Therapy Note  Patient Details  Name: Bryan [name] MRN: [id number] Date of Birth: 11-06-1975  Today's Date: 03/29/2020 OT Missed Time: 60 Minutes Missed Time Reason: Patient fatigue  Pt missed 60 mins scheduled OT treatment session due to refusal.  Pt received upright in bed.  Pt attempting to reposition in bed and push tray table out of the way.  Therapist encouraged pt to engage in therapy session, however pt reports "I need to rest up".  Therapist again encouraged pt participation, but pt continuing to refuse due to fatigue.  Rosalio Loud 03/29/2020, 1:59 PM

## 2020-03-29 NOTE — Progress Notes (Signed)
Physical Therapy Weekly Progress Note  Patient Details  Name: Bryan [name] MRN: [id number] Date of Birth: June 01, 1975  Beginning of progress report period: March 16, 2020 End of progress report period: March 29, 2020  Today's Date: 03/29/2020 PT Individual Time: 0800-0911 PT Individual Time Calculation (min): 71 min   Patient has met 2 of 3 short term goals. Pt demonstrates slow progress towards long term goals. Pt is currently able to perform bed mobility with supervision, transfers with RW and min/mod A, gait 50f with RW and min, navigate 4 steps with 2 rails and heavy mod A, and perform WC mobility 104fusing BUE and supervision. Pt continues to demonstrate BUE/LE ataxia with functional mobility, generalized weakness and poor endurance, poor motor planning, sequencing, and coordination, and visual deficits, all impacting pt's safety awareness.   Patient continues to demonstrate the following deficits muscle weakness, decreased cardiorespiratoy endurance, impaired timing and sequencing, abnormal tone, unbalanced muscle activation, ataxia, decreased coordination and decreased motor planning, decreased visual acuity and decreased visual motor skills, decreased motor planning, decreased awareness, decreased problem solving, decreased safety awareness, decreased memory and delayed processing and decreased standing balance, decreased postural control and decreased balance strategies and therefore will continue to benefit from skilled PT intervention to increase functional independence with mobility.  Patient progressing toward long term goals..  Continue plan of care.  PT Short Term Goals Week 2:  PT Short Term Goal 1 (Week 2): pt will perform all bed mobility with min A overall PT Short Term Goal 1 - Progress (Week 2): Met PT Short Term Goal 2 (Week 2): pt will transfer sit<>stand with LRAD and min A PT Short Term Goal 2 - Progress (Week 2): Progressing toward goal PT Short Term Goal 3  (Week 2): pt will ambulate 1571fith LRAD and mod A of 1 PT Short Term Goal 3 - Progress (Week 2): Met Week 3:  PT Short Term Goal 1 (Week 3): STG=LTG due to LOS  Skilled Therapeutic Interventions/Progress Updates:  Ambulation/gait training;Discharge planning;Functional mobility training;Psychosocial support;Therapeutic Activities;Visual/perceptual remediation/compensation;Balance/vestibular training;Disease management/prevention;Neuromuscular re-education;Skin care/wound management;Therapeutic Exercise;Wheelchair propulsion/positioning;Cognitive remediation/compensation;DME/adaptive equipment instruction;Pain management;Splinting/orthotics;UE/LE Strength taining/ROM;Community reintegration;Functional electrical stimulation;Patient/family education;Stair training;UE/LE Coordination activities   Today's Interventions: Received pt sitting in bed awake, pt agreeable to therapy, and denied any pain during session. Session with emphasis on functional mobility/transfers, generalized strengthening, dynamic standing balance/coordination, ambulation, stair navigation, NMR, motor control/coordination, and improved activity tolerance. Pt transferred supine<>sitting EOB with supervision from flat bed and transferred bed<>WC stand<>pivot with RW and min/mod A with cues for hand placement on RW and on bed. [facility] time management purposes and therapist took measurements to fit pt for 18x18 manual WC. Pt performed WC mobility 100f27fing BUE and supervision with increased time to therapy gym. Pt able to problem solve turns and propulsion technique without assist but did initially require hand over hand assist to locate rim of the WC to place hands. Pt navigated 4 steps with 2 rails and heavy mod A ascending with a step through and descending with a step to pattern. Pt with BLE ataxia and difficulty placing foot on step. Pt required cues to ensure foot was completely on step and where to  position hands on railings. Pt with increased difficulty turning to sit in WC at bottom of stairs requiring max multimodal cues. Pt ambulated 22ft36fh RW and min A. Pt required cues for turning technique and sequencing and to keep feet inside RW while ambulating. Provided pt  with 3 colored circles on 3in step. Pt able to correctly identify 2/3 colors with difficulty associating blue vs red. Pt transferred sit<>stand with RW and min A x 2 trials and worked on dynamic standing balance tapping LE to 3 colored dots with BUE support on RW and min A for balance based off therapist's cues. Pt able to correctly identify 6/10 and 2/6 trials prior to stating "I'm tired" and requesting to sit and rest. Pt with continued BLE ataxia and difficulty with motor control when using L versus R leg, but demonstrates improvements overall. Worked on dynamic standing balance matching cards to mirror by number and suit. Pt with increased difficulty with dual tasking, therefore returned to sitting and continued. Pt required max cues for identifying number and suit and then identifying where it's match was on the mirror. Noted pt with increased difficulty lifting BUEs and pt reporting his arms feel "heavy." Pt required extended rest break midway through activity due to fatigue. Pt reported being able to see the cards and the board but demonstrated significant dysmetria and required hand over hand assist to stick cards to Velcro. Stand<>pivot mat<>WC with RW and min A. Pt transported back to room in [facility] total A. Concluded session with pt sitting in WC, needs within reach, and seatbelt alarm on.   Therapy Documentation Precautions:  Precautions Precautions: Fall Required Braces or Orthoses: Other Brace Other Brace: L wrist cock up splint to be worn during day per RN - per [facility] care chart (did not have during CIR eval) Restrictions Weight Bearing Restrictions: No  Therapy/Group: Individual Therapy Alfonse Alpers PT,  DPT  03/29/2020, 7:26 AM

## 2020-03-29 NOTE — Progress Notes (Signed)
Speech Language Pathology Weekly Progress and Session Note  Patient Details  Name: Bryan [name] MRN: [id number] Date of Birth: Jul 28, 1975  Beginning of progress report period: March 23, 2020 End of progress report period: March 29, 2020  Today's Date: 03/29/2020 SLP Individual Time: 0950-1030 SLP Individual Time Calculation (min): 40 min  Short Term Goals: Week 2: SLP Short Term Goal 1 (Week 2): Patient will orient to date/approximate time with min A cues for using visual/orientation aides. SLP Short Term Goal 1 - Progress (Week 2): Not met SLP Short Term Goal 2 (Week 2): Patient will recall at least 3 different tasks completed during day (in therapy/nursing care) with mod A cues SLP Short Term Goal 2 - Progress (Week 2): Met SLP Short Term Goal 3 (Week 2): Patient will perform basic level ADL's (feeding-self during meals, etc) and problem solving tasks with mod A cues. SLP Short Term Goal 3 - Progress (Week 2): Met SLP Short Term Goal 4 (Week 2): Patient will initiate during structured conversation and/or simulated tasks with mod A cues. SLP Short Term Goal 4 - Progress (Week 2): Not met SLP Short Term Goal 5 (Week 2): Patient will demonstrate awareness to basic level errors during functional and simulated ADL tasks with min A SLP Short Term Goal 5 - Progress (Week 2): Not met SLP Short Term Goal 6 (Week 2): Pt will demonstrate sustained attention to basic tasks in 5 minute intervals with mod A verbal cues for redirection. SLP Short Term Goal 6 - Progress (Week 2): Met    New Short Term Goals: Week 3: SLP Short Term Goal 1 (Week 3): STG=LTG due to short ELOS  Weekly Progress Updates: Pt made moderate progress meeting 3 out 6 goals this reporting period. Pt demonstrated increase in communication speed, orientation, sustained attention and basic problem solving during ADL tasks. SLP communicated with pt's mother via phone several times to establish a better understanding of pt's  baseline. Pt was able to express wants/needs (although personality versus pragmatics was a limited talker), walk to the store purchase small things, has learning differences and completed 7th grade education. 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 Further education needs to be completed with pt's mother prior to discharge. Pt would continue to benefit from skilled ST services in order to maximize functional independence and reduce burden of care, requiring 24 hour supervision at discharge with continued skilled ST services.      Intensity: Minumum of 1-2 x/day, 30 to 90 minutes Frequency: 3 to 5 out of 7 days Duration/Length of Stay: 2/11 Treatment/Interventions: Cognitive remediation/compensation;Environmental controls;Functional tasks;Internal/external aids;Patient/family education;Speech/Language facilitation;Cueing hierarchy   Daily Session  Skilled Therapeutic Interventions: Skilled ST services focused on cognitive skills. Pt demonstrated recall of call bell and ability to answer phone from yesterday's session initially requiring max A verbal cues fading to min A verbal cues. Pt was able to return demonstration after several trials. SLP attempted to engage pt in initiating communication during structured conversation, however pt expressed discomfort with communicating with others. SLP communicated with pt's mother via phone and she was able to provide further insight (see note in weekly progress for details.) Pt was able to read at the word level, but expressed difficulty and baseline ability to read sight words only. Pt was able to generally subtract basic change, however noting calculations were further  off than at baseline. Pt was left in room with call bell within reach and bed alarm set. SLP recommends to continue  skilled services.     General    Pain Pain Assessment Pain Score: 0-No pain  Therapy/Group: Individual Therapy  Donnette Macmullen  [facility] 03/29/2020, 4:26 PM

## 2020-03-29 NOTE — Progress Notes (Signed)
Physical Therapy Session Note  Patient Details  Name: [name] MRN: [id number] Date of Birth: [birth year]  Today's Date: 03/29/2020 PT Individual Time: 2583-4621 PT Individual Time Calculation (min): 31 min   Short Term Goals: Week 2:  PT Short Term Goal 1 (Week 2): pt will perform all bed mobility with min A overall PT Short Term Goal 1 - Progress (Week 2): Met PT Short Term Goal 2 (Week 2): pt will transfer sit<>stand with LRAD and min A PT Short Term Goal 2 - Progress (Week 2): Progressing toward goal PT Short Term Goal 3 (Week 2): pt will ambulate 77f with LRAD and mod A of 1 PT Short Term Goal 3 - Progress (Week 2): Met  Skilled Therapeutic Interventions/Progress Updates:    Patient in supine on the phone with family.  Needed cues/prodding that it was time for therapy.  Compliant to end conversation and sat up EOB with S.  Applied shoes with total A for time management and pt stand pivot to w/c with mod cues and min A increased time.  Assisted in w/c to dayroom for time management.  Patient sit to stand min A to RW with cues for hand placement and technique.  Ambulated 415 min A with RW and increased time especially for turns to back up to chair to sit.  Patient ambulated around cones with RW with max cues and min A for turns and walker safety.  Patient fatigued and assisted in w/c to room.  Stand step to bed with RW and min A.  Sit to supine min A for positioning.  Left in supine with alarm active and needs in reach.  Therapy Documentation Precautions:  Precautions Precautions: Fall Required Braces or Orthoses: Other Brace Other Brace: L wrist cock up splint to be worn during day per RN - per [facility] care chart (did not have during CIR eval) Restrictions Weight Bearing Restrictions: No Pain: Pain Assessment Pain Score: 0-No pain   Therapy/Group: Individual Therapy  CReginia Naas CMagda Kiel PT 03/29/2020, 4:35 PM

## 2020-03-30 LAB — GLUCOSE, CAPILLARY
Glucose-Capillary: 106 mg/dL — ABNORMAL HIGH (ref 70–99)
Glucose-Capillary: 105 mg/dL — ABNORMAL HIGH (ref 70–99)
Glucose-Capillary: 84 mg/dL (ref 70–99)
Glucose-Capillary: 124 mg/dL — ABNORMAL HIGH (ref 70–99)

## 2020-03-30 MED ORDER — PROSOURCE PLUS PO LIQD
30.0000 mL | Freq: Every day | ORAL | Status: DC
  Administered 2020-03-31 – 2020-04-01 (×2): 30 mL via ORAL
  Filled 2020-03-30 (×2): qty 30

## 2020-03-30 NOTE — Progress Notes (Signed)
Speech Language Pathology Daily Session Note  Patient Details  Name: Bryan [name] MRN: [id number] Date of Birth: 1975-04-09  Today's Date: 03/30/2020 SLP Individual Time: 0800-0855 SLP Individual Time Calculation (min): 55 min  Short Term Goals: Week 3: SLP Short Term Goal 1 (Week 3): STG=LTG due to short ELOS  Skilled Therapeutic Interventions:   Patient seen for skilled ST session focusing on cognitive-linguistic goals. Patient continues to require verbal cues to initiate actions but is able to maintain attention and active participation with supervision. He initiated one time without cues to use bathroom. Patient demonstrated improved bilateral use of UE's during manipulative pattern task to put together pvc pipe puzzle and was able to place 3 of the pieces with only SLP providing verbal and visual cues to initiate, however for majority of task, he required modA cues to perform. Patient reported that peripheral vision difficulties existed before current hospitalization, but he continues to report some "bluriness" with with vision overall.Patient continues to benefit from skilled SLP intervention to maximize cognitive-linguistic functioning prior to discharge.  Pain Pain Assessment Pain Scale: 0-10 Pain Score: 0-No pain  Therapy/Group: Individual Therapy  Angela Nevin, MA, CCC-SLP Speech Therapy

## 2020-03-30 NOTE — Progress Notes (Signed)
Occupational Therapy Session Note  Patient Details  Name: Bryan [name] MRN: [id number] Date of Birth: 08-Aug-1975  Today's Date: 03/30/2020 OT Individual Time: 1050-1130 OT Individual Time Calculation (min): 40 min    Short Term Goals: Week 2:  OT Short Term Goal 1 (Week 2): Pt will complete toilet transfers with mod assist of one caregiver 3 out of 5 times to demonstrate increased carryover OT Short Term Goal 2 (Week 2): Pt will complete LB dressing with mod assist at sit > stand level OT Short Term Goal 3 (Week 2): Pt will complete UB dressing with min assist OT Short Term Goal 4 (Week 2): Pt will complete toileting task with max assist of one caregiver  Skilled Therapeutic Interventions/Progress Updates:    Treatment session with focus on sit > stand, dynamic standing balance, and functional use of BUE.  Pt received upright in w/c agreeable to therapy session. Engaged in sit > stand at high-low table with CGA.  Pt with one instance of sit > stand with supervision, however requiring CGA for majority of sit > stand attempts.  Engaged in visual scanning activity at table top while incorporating reaching with alternating UE.  Pt requiring increased cues for sequencing when reaching for items.  Continues to demonstrate some ataxia, but overall improvements when reaching to remove items from vertical surface.  Pt able to maintain upright standing posture with CGA during reaching with UE support for 5-6 mins at a time.  Pt ultimately agreeable to remain seated upright in w/c for lunch for improved positioning and use of BUE during self-feeding.  Pt remained upright in w/c with seat belt alarm on and all needs in reach.  Therapy Documentation Precautions:  Precautions Precautions: Fall Required Braces or Orthoses: Other Brace Other Brace: L wrist cock up splint to be worn during day per RN - per [facility] care chart (did not have during CIR eval) Restrictions Weight Bearing Restrictions: No Pain:   Pt with no c/o pain   Therapy/Group: Individual Therapy  Rosalio Loud 03/30/2020, 12:31 PM

## 2020-03-30 NOTE — Progress Notes (Signed)
[facility] PHYSICAL MEDICINE & REHABILITATION PROGRESS NOTE  Subjective/Complaints: Patient seen sitting up in a chair, will go to therapy this morning.  He states he slept well overnight.  No reported issues overnight.  ROS: limited due to cognition, but appears to deny CP, shortness of breath, nausea, vomiting, diarrhea.  Objective: Vital Signs: Blood pressure 120/77, pulse 70, temperature 98.3 F (36.8 C), resp. rate 14, height 5\' 10"  (1.778 m), weight 91.2 kg, SpO2 98 %. No results found. Recent Labs    03/28/20 0622  WBC 4.1  HGB 12.6*  HCT 38.1*  PLT 280   Recent Labs    03/28/20 0622  NA 135  K 4.1  CL 102  CO2 24  GLUCOSE 103*  BUN 11  CREATININE 0.83  CALCIUM 10.1    Intake/Output Summary (Last 24 hours) at 03/30/2020 1037 Last data filed at 03/30/2020 0900 Gross per 24 hour  Intake 676 ml  Output -  Net 676 ml     Pressure Injury 02/23/20 Heel Left Deep Tissue Pressure Injury - Purple or maroon localized area of discolored intact skin or blood-filled blister due to damage of underlying soft tissue from pressure and/or shear. (Active)  02/23/20 0800  Location: Heel  Location Orientation: Left  Staging: Deep Tissue Pressure Injury - Purple or maroon localized area of discolored intact skin or blood-filled blister due to damage of underlying soft tissue from pressure and/or shear.  Wound Description (Comments):   Present on Admission: No    Physical Exam: BP 120/77 (BP Location: Left Arm)   Pulse 70   Temp 98.3 F (36.8 C)   Resp 14   Ht 5\' 10"  (1.778 m)   Wt 91.2 kg   SpO2 98%   BMI 28.85 kg/m  Constitutional: No distress . Vital signs reviewed. HENT: Normocephalic.  Atraumatic. Eyes: EOMI. No discharge. Cardiovascular: No JVD.  RRR. Respiratory: Normal effort.  No stridor.  Bilateral clear to auscultation. GI: Non-distended.  BS +. Skin: Warm and dry.  Intact. Psych: Flat.  Slowed. Musc: No edema in extremities.  No tenderness in  extremities. Neuro: Alert and oriented to person and place only, consistently believes it is 2020, unchanged. Motor: 4-/5 throughout with apraxia, unchanged Poor attention/concentration  Assessment/Plan: 1. Functional deficits which require 3+ hours per day of interdisciplinary therapy in a comprehensive inpatient rehab setting.  Physiatrist is providing close team supervision and 24 hour management of active medical problems listed below.  Physiatrist and rehab team continue to assess barriers to discharge/monitor patient progress toward functional and medical goals   Care Tool:  Bathing    Body parts bathed by patient: Chest,Face,Right upper leg,Left upper leg,Right arm,Left arm,Abdomen,Front perineal area,Left lower leg   Body parts bathed by helper: Buttocks,Right lower leg     Bathing assist Assist Level: Minimal Assistance - Patient > 75%     Upper Body Dressing/Undressing Upper body dressing   What is the patient wearing?: Pull over shirt    Upper body assist Assist Level: Moderate Assistance - Patient 50 - 74%    Lower Body Dressing/Undressing Lower body dressing      What is the patient wearing?: Underwear/pull up,Pants     Lower body assist Assist for lower body dressing: Maximal Assistance - Patient 25 - 49%     Toileting Toileting    Toileting assist Assist for toileting: Dependent - Patient 0%     Transfers Chair/bed transfer  Transfers assist     Chair/bed transfer assist level: Minimal Assistance -  Patient > 75%     Locomotion Ambulation   Ambulation assist   Ambulation activity did not occur: Safety/medical concerns (fatigue, poor motor control/planning, weakness)  Assist level: Minimal Assistance - Patient > 75% Assistive device: Walker-rolling Max distance: 54ft   Walk 10 feet activity   Assist  Walk 10 feet activity did not occur: Safety/medical concerns (fatigue, poor motor control/planning, weakness)  Assist level: Minimal  Assistance - Patient > 75% Assistive device: Walker-rolling   Walk 50 feet activity   Assist Walk 50 feet with 2 turns activity did not occur: Safety/medical concerns (fatigue, poor motor control/planning, weakness)  Assist level: Minimal Assistance - Patient > 75% Assistive device: Walker-rolling    Walk 150 feet activity   Assist Walk 150 feet activity did not occur: Safety/medical concerns (fatigue, poor motor control/planning, weakness)         Walk 10 feet on uneven surface  activity   Assist Walk 10 feet on uneven surfaces activity did not occur: Safety/medical concerns (fatigue, poor motor control/planning, weakness)         Wheelchair     Assist Will patient use wheelchair at discharge?: Yes Type of Wheelchair: Manual    Wheelchair assist level: Supervision/Verbal cueing Max wheelchair distance: 141ft    Wheelchair 50 feet with 2 turns activity    Assist        Assist Level: Supervision/Verbal cueing   Wheelchair 150 feet activity     Assist      Assist Level: Total Assistance - Patient < 25%    Medical Problem List and Plan: 1.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 Please see conference note from today as well.  2. Antithrombotics: -DVT/anticoagulation:Pharmaceutical:Lovenox -antiplatelet therapy: N/A 3. Pain Management:  Reported with activity per therapies  Tylenol scheduled on 1/26  Appears relatively controlled on 2/2 4. Mood:LCSW to follow for evaluation and support. -antipsychotic agents: See #11. 5. Neuropsych: This patientis notcapable of making decisions onhisown behalf. 6. Skin/Wound Care:Routine pressure relief measures. 7.  Fluids/Electrolytes/Nutrition:Monitor I/Os.  8. T2DM with hyperglycemia: New diagnosis with A1C-7.1 on 1/19.  Carb modified diet.   Levemir decreased to 25 units  NovoLog decreased to 3 units  Metformin and meal coverage.   Use SSI for elevated BS   CBG (last 3)  Recent Labs    03/29/20 1712 03/29/20 2142 03/30/20 0740  GLUCAP 88 110* 106*   Appears to be stabilizing on 2/2 9.Acute blood loss anemia:Resolved  Hemoglobin 13.0 on 1/19 10. Elevated Triglycerides: 294 on 1/19 11. Psychosis d/o Schizophrenia. Last documented admission 11/20 (history of non-compliance)  Currently off Abiligy, Cogentin and Risperdal during hospitalization.   Continue Seroquel 100 nightly  Decreased Seroquel to 50 daily on 1/20 to improve daytime alertness, monitor Sleep wake chart.  Appreciate psych recs-started on fluoxetine  Appears to be stable on 2/2 12.  Elevated Creatinine: Resolved  Continue to monitor 13. Hyponatremia  Na 135 on 1/31  Continue to monitor 14.  Acute blood loss anemia  Hemoglobin 12.6 on 1/31 15. Visual deficits  Likely secondary to attention +/- diabetic  Unchanged  LOS: 15 days A FACE TO FACE EVALUATION WAS PERFORMED  Bryan [name] Karis Juba 03/30/2020, 10:37 AM

## 2020-03-30 NOTE — Progress Notes (Signed)
Physical Therapy Session Note  Patient Details  Name: Bryan [name] MRN: [id number] Date of Birth: Aug 13, 1975  Today's Date: 03/30/2020 PT Individual Time: 0915-0955  PT Individual Time Calculation (min): 40 min  PT Missed Time: 45 minutes PT Missed Time Reason: fatigue   Short Term Goals: Week 2:  PT Short Term Goal 1 (Week 2): pt will perform all bed mobility with min A overall PT Short Term Goal 1 - Progress (Week 2): Met PT Short Term Goal 2 (Week 2): pt will transfer sit<>stand with LRAD and min A PT Short Term Goal 2 - Progress (Week 2): Progressing toward goal PT Short Term Goal 3 (Week 2): pt will ambulate 87f with LRAD and mod A of 1 PT Short Term Goal 3 - Progress (Week 2): Met Week 3:  PT Short Term Goal 1 (Week 3): STG=LTG due to LOS  Skilled Therapeutic Interventions/Progress Updates:   Treatment Session 1: [id number] min Received pt supine in bed asleep, pt easily woken and agreeable to therapy, and denied any pain during session. Session with emphasis on functional mobility/transfers, generalized strengthening, dynamic standing balance/coordination, gait training, NMR, motor control/planning, and improved activity tolerance. Pt transferred supine<>sitting EOB with supervision and transferred bed<>WC with RW and min A with min cues for turning technique and hand placement on RW. [facility] purposes and ambulated 866fwith RW and min A with mod cues for turning technique. Pt demonstrated improvements in BLE ataxia. Worked on dynamic standing balance/NMR playing cornhole using RUE with LUE support on RW and CGA for balance x 3 trials. Pt reported being able to see cornhole board but stated it was "blurry." Pt able to get 2 beanbags on board (trial 1) but when therapist asked how many beanbags were on the board pt stated "7". Brought board closer to pt but pt still had difficulty counting correct amount of beanbags on board but was  able to state that they were "red" and "yellow" correctly. Trial 2: pt got 7 beanbags on board but when asked to count pt stated "8-9" and was only able to identify 5/7 correct colors. Trial 3: pt got 6 beanbags but stated "7-8" and required mod cues to count with therapist while therapist touched each beanbag to count. Stand<>pivot mat<>WC with RW and min A with cues to back up completely and [facility] prior to sitting. Pt [facility].  Concluded session with pt sitting in WC, needs within reach, and seatbelt alarm on.   Treatment Session 2  Received pt supine in bed asleep, pt required maximal verbal stimuli to arouse. Upon wakening pt reported fatigue from previous therapies. Therapist encouraged OOB mobility however pt shook head "no" and stated "tired", then proceeded to close eyes. Concluded session with pt supine in bed asleep, needs within reach, and bed alarm on. Will attempt to make up time as able. 45 minutes missed of skilled physical therapy due to fatigue.   Therapy Documentation Precautions:  Precautions Precautions: Fall Required Braces or Orthoses: Other Brace Other Brace: L wrist cock up splint to be worn during day per RN - per [facility] care chart (did not have during CIR eval) Restrictions Weight Bearing Restrictions: No  Therapy/Group: Individual Therapy AnAlfonse AlpersT, DPT   03/30/2020, 7:28 AM

## 2020-03-30 NOTE — Patient Care Conference (Signed)
Inpatient RehabilitationTeam Conference and Plan of Care Update Date: 03/30/2020   Time: 11:54 AM    Patient Name: Bryan [name]      Medical Record Number: [id number]  Date of Birth: [birth year] Sex: Male         Room/Bed: 4W12C/4W12C-01 Payor Info: Payor: MEDICAID [place] / Plan: MEDICAID [place] ACCESS / Product Type: *No Product type* /    Admit Date/Time:  03/15/2020  5:13 PM  Primary Diagnosis:  Anoxic brain injury [facility])  Hospital Problems: Principal Problem:   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HCC) Active Problems:   Schizophrenia (HCC)   Elevated triglycerides with high cholesterol   Controlled type 2 diabetes mellitus with hyperglycemia (HCC)   Elevated serum creatinine   Hyponatremia   Acute blood loss anemia   Visual disturbance    Expected Discharge Date: Expected Discharge Date: 04/08/20  Team Members Present: Physician leading conference: Dr. Maryla Morrow Care Coodinator Present: Chana Bode, RN, BSN, CRRN;Cecile Sheerer, LCSWA Nurse Present: Regino Schultze, RN PT Present: Raechel Chute, PT OT Present: Rosalio Loud, OT SLP Present: Elio Forget, SLP PPS Coordinator present : Fae Pippin, [place], PT     Current Status/Progress Goal Weekly Team Focus  Bowel/Bladder   occasionally incontinent, getting patient up q6-8 hours to void. LBM 01/31, continent  pt will be continent of B/B with normal bowel pattern  continue to get patient up to bathroom q6-8 hours   Swallow/Nutrition/ Hydration             ADL's   min-mod A bed mobility, mod assist bathing and dressing, min-mod assist stand pivot with RW.  Continues to require increased assist wtih dressing due to impaired vision, sequencing and functionla use of BUE  Min A overall  ADL retraining, transfers, sit > stand, dynamic standing balance, functional use of BUE, activity tolerance, sequencing, problem solving   Mobility   bed mobility CGA/supervision, transfers with RW min/mod A, gait 3ft with RW min/mod A  CGA  transfers, min A gait and car transfer  functional mobility/transfers, generalized strengthening, dynamic standing balance/coordination, ambulation, NMR, motor control, and endurance   Communication   min-supervision A  Supervision A  completing basic thoughts to express wants/needs, education   Safety/Cognition/ Behavioral Observations  Mod A  Min A  education, basic problem solving, error awraeness, sustained attention and orientation   Pain   occasionally complains of back pain, refuses scheduled tylenol at times  pt will be free of pain  assess pain every shift & PRN   Skin   ear abrasion healed, stage II to left heel OTA-elevating on pillow and wears prevalon boot, trach site basically healed  pt will have resolution of current issues no further breakdown or infection  assess skin qshift and prn     Discharge Planning:  Plan to go home with Mom and brother's assisting, unsure if realize the amount of care he will be at discharge. Will need family education prior to discharge home   Team Discussion: Medically stable with minor adjustment to DM medications per MD. Progress limited by slow processing and sequencing issues and patient reports continued issues with poor peripheral vision/blurred vision. Patient on target to meet rehab goals: yes, currently min assist for transfers and requires cues for safety/sequencing. Mod-mas assist for self care and min assist for ambuation with CGA goals for transfersa nd min assist for gait/ADLs.  *See Care Plan and progress notes for long and short-term goals.   Revisions to Treatment Plan:   Teaching Needs: Safety  cues and sequencing cues, medications, DM management, etc.  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  Barriers to Discharge: Medical stability;New diabetic;Home enviroment access/layout;Decreased family/caregiver support;Behavior;Incontinence   Possible Resolutions to [employer] Focus: Therapies, optimize DM meds, bowel meds, follow labs - Na   Continued Need for Acute Rehabilitation Level of Care: The patient requires daily medical management by a physician with specialized training in physical medicine and rehabilitation for the following reasons: Direction of a multidisciplinary physical rehabilitation program to maximize functional independence : Yes Medical management of patient stability for increased activity during participation in an intensive rehabilitation regime.: Yes Analysis of laboratory values and/or radiology reports with any subsequent need for medication adjustment and/or medical intervention. : Yes   I attest that I was present, lead the team conference, and concur with the assessment and plan of the team.   Chana Bode B 03/30/2020, 12:46 PM

## 2020-03-30 NOTE — Progress Notes (Addendum)
Patient ID: Bryan [name], male   DOB: 1975/10/03, 45 y.o.   MRN: [id number]  This SW covering for primary SW, Victor.  SW met with pt in room to provide updates from team conference, and d/c date remains 2/11. Pt informed on how he will need RW and w/c. Pt states his mother is the best patient to discuss his care needs with.   1651- SW left message for pt mother Bryan [name] [phone]) requesting return phone call to discuss pt discharge needs and schedule family education. SW waiting on follow-up.  1705- SW spoke with pt mother Bryan [name] to discuss family education. She and his brother [name] will be in on Tuesday 2/8 10am-3pm. She confirms that his brother will be home 24/7.   Loralee Pacas, MSW, [place] Office: [phone] Cell: [phone] Fax: [phone]

## 2020-03-30 NOTE — Progress Notes (Signed)
Nutrition Follow-up  RD working remotely.  DOCUMENTATION CODES:   Not applicable  INTERVENTION:   - Decrease ProSource Plus to 30 ml po once daily, each supplement provides 100 kcal and 15 grams of protein  - Continue MVI with minerals daily  - Ongoing diabetes diet education with pt and family  NUTRITION DIAGNOSIS:   Increased nutrient needs related to wound healing,other (therapies) as evidenced by estimated needs.  Ongoing  GOAL:   Patient will meet greater than or equal to 90% of their needs  Progressing  MONITOR:   PO intake,Supplement acceptance,Labs,Weight trends,Skin,I & O's  REASON FOR ASSESSMENT:   Consult Diet education  ASSESSMENT:   45 year old male with PMH of HTN and schizophrenia who was admitted on 01/25/20 with newly diagnosed DM with DKA with AKI. On 11/30, patient found apneic and pulseless requiring two rounds of CPR with ACLS protocol with ROSC. Pt was intubated and work-up done revealing moderate acute pancreatitis. Pt required tracheostomy 12/18. Pt tolerated extubation and self decannulated on 03/03/20. Admitted to CIR on 1/18.  Unable to reach pt via phone call to room. Noted pt's insulin regimen was adjusted related to frequent hypoglycemic episodes. PO intake remains good with most meal completions >/=95%.  Pt refusing most ProSource Plus supplements. RD will decrease from TID to to daily. Will continue MVI with minerals daily.  Weight stable since admission to CIR.  Meal Completion: 25-100% x last 8 documented meals (averaging 83%)  Medications reviewed and include: ProSource Plus TID, colace, SSI, novolog 3 units TID with meals, levemir 25 units daily, metformin, MVI with minerals, protonix  Labs reviewed. CBG's: 65-110 x 24 hours  Diet Order:   Diet Order            Diet Carb Modified Fluid consistency: Thin; Room service appropriate? Yes  Diet effective now                 EDUCATION NEEDS:   Not appropriate for education  at this time  Skin:  Skin Assessment: Skin Integrity Issues: DTI: left heel Incisions: neck  Last BM:  03/28/20  Height:   Ht Readings from Last 1 Encounters:  03/15/20 5\' 10"  (1.778 m)    Weight:   Wt Readings from Last 1 Encounters:  03/29/20 91.2 kg    BMI:  Body mass index is 28.85 kg/m.  Estimated Nutritional Needs:   Kcal:  2200-2400  Protein:  130-150 grams  Fluid:  2.2-2.4 L    05/27/20, MS, RD, LDN Inpatient Clinical Dietitian Please see AMiON for contact information.

## 2020-03-31 LAB — GLUCOSE, CAPILLARY
Glucose-Capillary: 107 mg/dL — ABNORMAL HIGH (ref 70–99)
Glucose-Capillary: 117 mg/dL — ABNORMAL HIGH (ref 70–99)
Glucose-Capillary: 77 mg/dL (ref 70–99)
Glucose-Capillary: 66 mg/dL — ABNORMAL LOW (ref 70–99)
Glucose-Capillary: 83 mg/dL (ref 70–99)

## 2020-03-31 NOTE — Progress Notes (Signed)
Physical Therapy Session Note  Patient Details  Name: Bryan [name] MRN: [id number] Date of Birth: [birth year]  Today's Date: 03/31/2020 PT Individual Time: 0923-3007 PT Individual Time Calculation (min): 84 min   Short Term Goals: Week 2:  PT Short Term Goal 1 (Week 2): pt will perform all bed mobility with min A overall PT Short Term Goal 1 - Progress (Week 2): Met PT Short Term Goal 2 (Week 2): pt will transfer sit<>stand with LRAD and min A PT Short Term Goal 2 - Progress (Week 2): Progressing toward goal PT Short Term Goal 3 (Week 2): pt will ambulate 75f with LRAD and mod A of 1 PT Short Term Goal 3 - Progress (Week 2): Met Week 3:  PT Short Term Goal 1 (Week 3): STG=LTG due to LOS  Skilled Therapeutic Interventions/Progress Updates:   Received pt sitting in WC, pt agreeable to therapy, and denied any pain during session. Session with emphasis on functional mobility/transfers, generalized strengthening, dynamic standing balance/coordination, ambulation, NMR, motor control/sequencing, and improved activity tolerance. Pt performed WC mobility 1576fusing BUE, supervision, and increased time to therapy gym. Pt ambulated 8095f 2 trials with RW and min A with min cues for turning technique and RW management. Pt with tendency to veer toward L side and required cues to avoid running into door on L. Pt transferred sit<>stand inside // bars with min A and worked on motWeb designeralance, and dual task stepping forward to each square of agility ladder with BUE support and CGA x10 ft. Pt required maximal verbal and mod tactile cues to step each foot to square. Pt frequently stepping outside square with LLE and forgetting to step-to with RLE often stepping through. Pt performed the same activity lateral stepping with BUE support and CGA x [id number] present briefly during session and asked pt what activity he just finished working on inside // bars. Pt unable to recall stating "I can't  believe I forgot what we just did." Pt transferred sit<>stand with RW and min A x 3 trials and performed the following exercises standing with BUE support and CGA/min A for balance: -alternating marches x10 bilaterally -mini-squats x10 -heel raises x10 Pt unable to recall day of the week, month, or year but able to state location and time of day. Pt reported feeling "stiff" in his back towards end of session. Performed seated forward and lateral trunk flexion x15 seconds each for relief. Worked on dynamic sitting balance, postural control, fine motor control/planning, visual scanning, constructing cross with pipelines with mod cues for sequencing. Stand<>pivot mat<>WC with RW and min A with cues for turning technique. Pt [facility] and requested to return to bed. Stand<>pivot WC<>bed with RW and min A and sit<>supine with supervision. Concluded session with pt semi-reclined in bed, needs within reach, and bed alarm on. Therapist provided fresh drink for pt.   Therapy Documentation Precautions:  Precautions Precautions: Fall Required Braces or Orthoses: Other Brace Other Brace: L wrist cock up splint to be worn during day per RN - per [facility] care chart (did not have during CIR eval) Restrictions Weight Bearing Restrictions: No  Therapy/Group: Individual Therapy AnnaAlfonse Alpers DPT   03/31/2020, 7:34 AM

## 2020-03-31 NOTE — Progress Notes (Signed)
Speech Language Pathology Daily Session Note  Patient Details  Name: Bryan [name] MRN: [id number] Date of Birth: December 16, 1975  Today's Date: 03/31/2020 SLP Individual Time: 1300-1400 SLP Individual Time Calculation (min): 60 min  Short Term Goals: Week 3: SLP Short Term Goal 1 (Week 3): STG=LTG due to short ELOS  Skilled Therapeutic Interventions:   Patient seen for skilled ST session focusing on cognitive function goals. Patient already in [facility] when SLP arrived and we then went into large open gym. Patient demonstrated task learning with improved performance and independence with novel task (using Wii controller to play golf, baseball games. Patient able to coordinate pushing and holding down button while he swung controller up and down. He demonstrated adequate performance but was unable to modulate/decrease intensity of his arm movements. He was not able to perform actions that required more than two steps to complete. Patient able to describe that he thought the game was good for his "attention". He continues to require cues to initiate responses and actions, however he is more engaged in conversation and tasks overall. Patient continues to benefit from skilled SLP intervention to maximize cognitive linguistic function prior to discharge.  Pain Pain Assessment Pain Scale: 0-10 Pain Score: 0-No pain  Therapy/Group: Individual Therapy  Angela Nevin, MA, CCC-SLP Speech Therapy

## 2020-03-31 NOTE — Progress Notes (Signed)
Bryan [name] PHYSICAL MEDICINE & REHABILITATION PROGRESS NOTE  Subjective/Complaints: Patient seen laying in bed this morning.  He states he slept well overnight.  No reported issues overnight.  He is unaware of discharge plans.  ROS: limited due to cognition, but appears to deny CP, shortness of breath, nausea, vomiting, diarrhea.  Objective: Vital Signs: Blood pressure 116/84, pulse 68, temperature 98.4 F (36.9 C), resp. rate 17, height 5\' 10"  (1.778 m), weight 91.2 kg, SpO2 96 %. No results found. No results for input(s): WBC, HGB, HCT, PLT in the last 72 hours. No results for input(s): NA, K, CL, CO2, GLUCOSE, BUN, CREATININE, CALCIUM in the last 72 hours.  Intake/Output Summary (Last 24 hours) at 03/31/2020 1113 Last data filed at 03/31/2020 0700 Gross per 24 hour  Intake 600 ml  Output 225 ml  Net 375 ml     Pressure Injury 02/23/20 Heel Left Deep Tissue Pressure Injury - Purple or maroon localized area of discolored intact skin or blood-filled blister due to damage of underlying soft tissue from pressure and/or shear. (Active)  02/23/20 0800  Location: Heel  Location Orientation: Left  Staging: Deep Tissue Pressure Injury - Purple or maroon localized area of discolored intact skin or blood-filled blister due to damage of underlying soft tissue from pressure and/or shear.  Wound Description (Comments):   Present on Admission: No    Physical Exam: BP 116/84   Pulse 68   Temp 98.4 F (36.9 C)   Resp 17   Ht 5\' 10"  (1.778 m)   Wt 91.2 kg   SpO2 96%   BMI 28.85 kg/m  Constitutional: No distress . Vital signs reviewed. HENT: Normocephalic.  Atraumatic. Eyes: EOMI. No discharge. Cardiovascular: No JVD.  RRR. Respiratory: Normal effort.  No stridor.  Bilateral clear to auscultation. GI: Non-distended.  BS +. Skin: Warm and dry.  Intact. Psych: Flat.  Slowed. Neuro: Alert and oriented to person and place only, consistently believes it is 2020, stable. Motor: 4-/5  throughout with apraxia, stable Poor attention/concentration  Assessment/Plan: 1. Functional deficits which require 3+ hours per day of interdisciplinary therapy in a comprehensive inpatient rehab setting.  Physiatrist is providing close team supervision and 24 hour management of active medical problems listed below.  Physiatrist and rehab team continue to assess barriers to discharge/monitor patient progress toward functional and medical goals   Care Tool:  Bathing    Body parts bathed by patient: Chest,Face,Right upper leg,Left upper leg,Right arm,Left arm,Abdomen,Front perineal area,Left lower leg   Body parts bathed by helper: Buttocks,Right lower leg     Bathing assist Assist Level: Minimal Assistance - Patient > 75%     Upper Body Dressing/Undressing Upper body dressing   What is the patient wearing?: Pull over shirt    Upper body assist Assist Level: Moderate Assistance - Patient 50 - 74%    Lower Body Dressing/Undressing Lower body dressing      What is the patient wearing?: Underwear/pull up,Pants     Lower body assist Assist for lower body dressing: Maximal Assistance - Patient 25 - 49%     Toileting Toileting    Toileting assist Assist for toileting: Dependent - Patient 0%     Transfers Chair/bed transfer  Transfers assist     Chair/bed transfer assist level: Minimal Assistance - Patient > 75%     Locomotion Ambulation   Ambulation assist   Ambulation activity did not occur: Safety/medical concerns (fatigue, poor motor control/planning, weakness)  Assist level: Minimal Assistance - Patient >  75% Assistive device: Walker-rolling Max distance: 75ft   Walk 10 feet activity   Assist  Walk 10 feet activity did not occur: Safety/medical concerns (fatigue, poor motor control/planning, weakness)  Assist level: Minimal Assistance - Patient > 75% Assistive device: Walker-rolling   Walk 50 feet activity   Assist Walk 50 feet with 2 turns  activity did not occur: Safety/medical concerns (fatigue, poor motor control/planning, weakness)  Assist level: Minimal Assistance - Patient > 75% Assistive device: Walker-rolling    Walk 150 feet activity   Assist Walk 150 feet activity did not occur: Safety/medical concerns (fatigue, poor motor control/planning, weakness)         Walk 10 feet on uneven surface  activity   Assist Walk 10 feet on uneven surfaces activity did not occur: Safety/medical concerns (fatigue, poor motor control/planning, weakness)         Wheelchair     Assist Will patient use wheelchair at discharge?: Yes Type of Wheelchair: Manual    Wheelchair assist level: Supervision/Verbal cueing Max wheelchair distance: 133ft    Wheelchair 50 feet with 2 turns activity    Assist        Assist Level: Supervision/Verbal cueing   Wheelchair 150 feet activity     Assist      Assist Level: Total Assistance - Patient < 25%    Medical Problem List and Plan: 1.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 Antithrombotics: -DVT/anticoagulation:Pharmaceutical:Lovenox -antiplatelet therapy: N/A 3. Pain Management:  Reported with activity per therapies  Tylenol scheduled on 1/26  Appears relatively controlled on 2/3 4. Mood:LCSW to follow for evaluation and support. -antipsychotic agents: See #11. 5. Neuropsych: This patientis notcapable of making decisions onhisown behalf. 6. Skin/Wound Care:Routine pressure relief measures. 7. Fluids/Electrolytes/Nutrition:Monitor I/Os.  8. T2DM with hyperglycemia: New diagnosis with A1C-7.1 on 1/19.  Carb modified diet.   Levemir decreased to 25 units  NovoLog decreased to 3 units  Metformin and meal coverage.   Use SSI for elevated BS   CBG (last 3)  Recent Labs    03/30/20 1621 03/30/20 2106 03/31/20 0556  GLUCAP 84 124* 107*   Relatively  controlled on 2/3 9.Acute blood loss anemia:Resolved  Hemoglobin 13.0 on 1/19 10. Elevated Triglycerides: 294 on 1/19 11. Psychosis d/o Schizophrenia. Last documented admission 11/20 (history of non-compliance)  Currently off Abiligy, Cogentin and Risperdal during hospitalization.   Continue Seroquel 100 nightly  Decreased Seroquel to 50 daily on 1/20 to improve daytime alertness, monitor Sleep wake chart.  Appreciate psych recs-started on fluoxetine  Appears to be stable on 2/3 12.  Elevated Creatinine: Resolved  Continue to monitor 13. Hyponatremia  Na 135 on 1/31  Continue to monitor 14.  Acute blood loss anemia  Hemoglobin 12.6 on 1/31 15. Visual deficits-appears to be premorbid  Likely secondary to attention +/- diabetic  Stable  LOS: 16 days A FACE TO FACE EVALUATION WAS PERFORMED  Faizah Kandler Karis Juba 03/31/2020, 11:13 AM

## 2020-03-31 NOTE — Progress Notes (Signed)
Occupational Therapy Session Note  Patient Details  Name: Bryan [name] MRN: [id number] Date of Birth: 1976/01/02  Today's Date: 03/31/2020 OT Individual Time: 1000-1100 OT Individual Time Calculation (min): 60 min    Short Term Goals: Week 2:  OT Short Term Goal 1 (Week 2): Pt will complete toilet transfers with mod assist of one caregiver 3 out of 5 times to demonstrate increased carryover OT Short Term Goal 2 (Week 2): Pt will complete LB dressing with mod assist at sit > stand level OT Short Term Goal 3 (Week 2): Pt will complete UB dressing with min assist OT Short Term Goal 4 (Week 2): Pt will complete toileting task with max assist of one caregiver  Skilled Therapeutic Interventions/Progress Updates:    Pt received in bed and agreeable to a shower.   See ADL documentation below for details.  Pt participated well and attended well to tasks but continues to need moderate verbal and minimal tactile cues to initiate and complete tasks. He has difficulty with problem solving. Pt completed self care overall at a min to mod level.  Resting in wc at end of session with all needs met. Belt alarm on.   Therapy Documentation Precautions:  Precautions Precautions: Fall Required Braces or Orthoses: Other Brace Other Brace: L wrist cock up splint to be worn during day per RN - per [facility] care chart (did not have during CIR eval) Restrictions Weight Bearing Restrictions: No   Pain: Pain Assessment Pain Score: 0-No pain ADL: ADL Grooming: Moderate cueing,Setup Where Assessed-Grooming: Sitting at sink Upper Body Bathing: Supervision/safety Where Assessed-Upper Body Bathing: Shower Lower Body Bathing: Minimal assistance Where Assessed-Lower Body Bathing: Shower Upper Body Dressing: Minimal assistance Where Assessed-Upper Body Dressing: Wheelchair Lower Body Dressing: Moderate assistance Where Assessed-Lower Body Dressing: Wheelchair Toileting: Dependent Toilet Transfer: Not  assessed Tub/Shower Transfer: Not assessed Social research officer, government: Minimal assistance Social research officer, government Method: Radiographer, therapeutic: Transfer tub bench,Grab bars   Therapy/Group: Individual Therapy  [place] 03/31/2020, 12:32 PM

## 2020-03-31 NOTE — Progress Notes (Signed)
Patient ID: Bryan [name], male   DOB: 06-Nov-1975, 45 y.o.   MRN: [id number]   This SW covering for assigned SW, Becky Dupree.   SW ordered DME: RW and w/c with [facility] via parachute.  SW called pt mother [name] [phone]) to provide updates. She mentioned concerns related to a hospital bed needed since his bed is very low and concerned he may strain his back. SW informed will ask medical team and will update her. SW informed on HHPT/OT recommended and will leave HHA list in room with pt. SW did explain possible challenges with obtaining HH due to pt insurance. *[facility] team amenable to hospital bed. SW ordered hospital bed with [facility].   SW provided pt with HHA list and informed on above.   Cecile Sheerer, MSW, LCSWA Office: [phone] Cell: [phone] Fax: [phone]

## 2020-03-31 NOTE — Progress Notes (Addendum)
Hypoglycemic Event  CBG: 66  Treatment: 240 mls of orange juice  Symptoms: none  Follow-up CBG: Time: 2240 CBG Result: 83  Possible Reasons for Event: unknown   Alferd Apa

## 2020-04-01 LAB — GLUCOSE, CAPILLARY
Glucose-Capillary: 88 mg/dL (ref 70–99)
Glucose-Capillary: 97 mg/dL (ref 70–99)
Glucose-Capillary: 96 mg/dL (ref 70–99)
Glucose-Capillary: 88 mg/dL (ref 70–99)

## 2020-04-01 NOTE — Progress Notes (Signed)
Speech Language Pathology Daily Session Note  Patient Details  Name: Bryan [name] MRN: [id number] Date of Birth: 10/26/1975  Today's Date: 04/01/2020 SLP Individual Time: 1345-1430 SLP Individual Time Calculation (min): 45 min  Short Term Goals: Week 3: SLP Short Term Goal 1 (Week 3): STG=LTG due to short ELOS  Skilled Therapeutic Interventions:  Patient seen for skilled ST session focusing on cognitive-linguistic goals. RN mentioned to SLP that patient was able to feed himslef some with his lunch and she was very pleased. She said he still likely benefits from full supervision as when the food gets pushed around the plate, he has more difficulty finding it. Upon SLP entering room, patient was asleep on bed and although he was easy to arouse, he was very slow in initiating to sit up in bed and then complete transfer to [facility]. After WC transfer, patient started to wake up some more and was able to participate fully. He continues to have a very difficult time with answering specific questions even when related to his likes/dislikes, ie when asking what shows he likes he would say, "Whatever interests me". He was able to specify a little more with SLP cues to mention things he doesn't like and he said, "when a show is silly". Overall, patient continues to demonstrate steady progress towards goals and continues to benefit from skilled SLP intervention prior to discharge to maximize cognitive function.  Pain Pain Assessment Pain Scale: 0-10 Pain Score: 0-No pain  Therapy/Group: Individual Therapy  Angela Nevin, MA, CCC-SLP Speech Therapy

## 2020-04-01 NOTE — Progress Notes (Signed)
Physical Therapy Session Note  Patient Details  Name: Bryan [name] MRN: [id number] Date of Birth: 1975-08-23  Today's Date: 04/01/2020 PT Individual Time: 1430-1525 PT Individual Time Calculation (min): 55 min   Short Term Goals: Week 3:  PT Short Term Goal 1 (Week 3): STG=LTG due to LOS  Skilled Therapeutic Interventions/Progress Updates:     Handoff of care from SLP and pt agreeable to therapy. No reports of pain. Wheeled with totalA in w/c from his room outside his door in the hallway. He propelled himself ~167ft from his room to outside the large double doors, requiring minA for turns but improved ability to produce efficient strokes with BUE's.   Wheeled the remaining distance to ortho gym and positioned in front of BITS system. Performed NMR for dual cog task with trail making tasks and visual scanning with single target. He completed this with RW support and close supervision for safety. He used RUE throughout, working on [facility] and forward functional reaching. He required mod/max cues for completing #1-5 with trailmaking and ocassionally requiring hand-over-hand assist for reaching across midline or lateral reaching outside BOS. He had around an 8 second reaction time for the targets but he was able to stand for ~7 minute bouts while completing.   Gait training 2x14ft (seated rest break) with CGA and RW, mild ataxia and sometimes kicking walker with L foot especially with turns. Gait distances limited by primarily by fatigue as pt reports feeling "exhausted" towards end of 37ft. He requires mod cues for safety approach and minA for RW while turning to sit into a seated surface. Returned to his room with totalA in w/c and pt requesting to lay down in bed. Stand<>pivot transfer with minA and mod cues for sequencing turn to sit. Sit>supine completed with HOB flat with use of bed rail. He ended session supine in bed with needs in reach and bed alarm on. Pt with great participation during today's  session.  Therapy Documentation Precautions:  Precautions Precautions: Fall Required Braces or Orthoses: Other Brace Other Brace: L wrist cock up splint to be worn during day per RN - per [facility] care chart (did not have during CIR eval) Restrictions Weight Bearing Restrictions: No  Therapy/Group: Individual Therapy  Donabelle Molden P Cesare Sumlin PT 04/01/2020, 7:39 AM

## 2020-04-01 NOTE — Progress Notes (Signed)
Occupational Therapy Weekly Progress Note  Patient Details  Name: Bryan [name] MRN: [id number] Date of Birth: October 07, 1975  Beginning of progress report period: March 23, 2020 End of progress report period: April 01, 2020  Today's Date: 04/01/2020 OT Individual Time: 7741-2878 OT Individual Time Calculation (min): 75 min    Patient has met 4 of 4 short term goals.  Pt is making steady progress towards goals.  Pt currently requires min assist - CGA for sit > stand and stand pivot transfers with mod multimodal cues for sequencing due to impaired vision, attention to task, and working memory. Pt is able to complete bathing and dressing tasks at overall min assist level, again requiring increased cues for sequencing and increased time to allow for increased initiation and problem solving. Pt continues to report impaired vision, however inconsistent when attempting to further assess and during therapeutic activities.  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  Plan to complete family education with mother and brothers as pt will require Min A - Supervision upon d/c.  Patient continues to demonstrate the following deficits: muscle weakness,decreased cardiorespiratoy endurance,impaired timing and sequencing, unbalanced muscle activation, decreased coordination and decreased motor planning,decreased visual perceptual skills,decreased attention to right,decreased attention, decreased awareness, decreased problem solving, decreased safety awareness, decreased memory and delayed processingand decreased sitting balance, decreased standing balance, decreased postural control and decreased balance strategies and therefore will continue to benefit from skilled OT intervention to enhance overall performance with BADL and Reduce care partner burden.  Patient progressing toward long term  goals..  Continue plan of care.  OT Short Term Goals Week 2:  OT Short Term Goal 1 (Week 2): Pt will complete toilet transfers with mod assist of one caregiver 3 out of 5 times to demonstrate increased carryover OT Short Term Goal 1 - Progress (Week 2): Met OT Short Term Goal 2 (Week 2): Pt will complete LB dressing with mod assist at sit > stand level OT Short Term Goal 2 - Progress (Week 2): Met OT Short Term Goal 3 (Week 2): Pt will complete UB dressing with min assist OT Short Term Goal 3 - Progress (Week 2): Met OT Short Term Goal 4 (Week 2): Pt will complete toileting task with max assist of one caregiver OT Short Term Goal 4 - Progress (Week 2): Met Week 3:  OT Short Term Goal 1 (Week 3): STG = LTGs due to remaining LOS  Skilled Therapeutic Interventions/Progress Updates:    Treatment session with focus on self-care retraining, dynamic standing balance, and functional transfers.  Pt received supine in bed agreeable to shower.  Completed bed mobility with supervision with increased time.  Pt completed sit > stand from EOB with min assist, once upright completed stand pivot transfer CGA to w/c with RW.  Therapist transported pt to ADL apt bathroom to complete shower in setup similar to home to increase carryover.  Pt completed stand pivot transfers w/c <> tub bench with min assist with RW.  Pt required increased time to problem solve lateral scoots along tub bench in to shower.  Pt completed bathing with setup of items and mod cues for sequencing and thoroughness, but pt able to wash all body parts except buttocks this session.  Pt completed sit > stand in shower with CGA and use of grab bars, attempted to wash buttocks but reports instability and requested therapist complete for thoroughness.  Pt returned to  w/c for dressing.  Pt able to don sweatshirt with min assist and increased time due to long sleeves.  Mod multimodal cues for LB dressing with pt initially stating that he couldn't don pants,  but able to do with increased time and gestures.  Pt completed sit > stand from LB dressing CGA with cues for hand placement for improved safety with sit > stand.  Returned to room and completed oral care with setup.  Pt then remained upright in w/c with seat belt alarm on and all needs in reach.  Therapy Documentation Precautions:  Precautions Precautions: Fall Required Braces or Orthoses: Other Brace Other Brace: L wrist cock up splint to be worn during day per RN - per [facility] care chart (did not have during CIR eval) Restrictions Weight Bearing Restrictions: No Pain: Pain Assessment Pain Scale: 0-10 Pain Score: 0-No pain   Therapy/Group: Individual Therapy  Simonne Come 04/01/2020, 12:20 PM

## 2020-04-01 NOTE — Progress Notes (Signed)
Physical Therapy Session Note  Patient Details  Name: Bryan [name] MRN: [id number] Date of Birth: 1975/12/28  Today's Date: 04/01/2020 PT Individual Time:  -      Short Term Goals: Week 1:  PT Short Term Goal 1 (Week 1): pt will perform all bed mobility with min A overall PT Short Term Goal 1 - Progress (Week 1): Progressing toward goal PT Short Term Goal 2 (Week 1): pt will transfer sit<>stand with LRAD and min A PT Short Term Goal 2 - Progress (Week 1): Progressing toward goal PT Short Term Goal 3 (Week 1): Pt will initiate gait training with LRAD PT Short Term Goal 3 - Progress (Week 1): Met Week 2:  PT Short Term Goal 1 (Week 2): pt will perform all bed mobility with min A overall PT Short Term Goal 1 - Progress (Week 2): Met PT Short Term Goal 2 (Week 2): pt will transfer sit<>stand with LRAD and min A PT Short Term Goal 2 - Progress (Week 2): Progressing toward goal PT Short Term Goal 3 (Week 2): pt will ambulate 86f with LRAD and mod A of 1 PT Short Term Goal 3 - Progress (Week 2): Met  Skilled Therapeutic Interventions/Progress Updates:    PAIN denies pain  Pt initially oob in wc.  Requests assist to bed but agreeable to session when I explained scheduled PT session.  Pt transported to day room.  Sit to stand w/min assist from wc, cues for placement of L hand on walker/attention to task.   Gait 107fw/RW w/cga, cues to attend to L environment, occasionally kicks walker w/L foot/seems unaware, mildly ataxic gait, very slow cadence  Functional gait /wFloria Ravelinghu course x 507f/4 colored cones placed at 9ft28ftervals w/max verbal cues to negotiate course, slow cadence, very slow processing, multiple simple commands required to complete task.  Pt unable to engage in dual task due to cognition. Pt also requires cues to clear cones when on his L.  Pt transported to room.  Sit to stand and short distance gait to head of bed/turn/sit to bed w/cga and mult cues for completion of  task.  Sit to supine w/cga, additional time.   Pt left supine w/rails up x 4, alarm set, bed in lowest position, and needs in reach.  Nursing w/pt to provide meds.     Therapy Documentation Precautions:  Precautions Precautions: Fall Required Braces or Orthoses: Other Brace Other Brace: L wrist cock up splint to be worn during day per RN - per [facility] care chart (did not have during CIR eval) Restrictions Weight Bearing Restrictions: No    Therapy/Group: Individual Therapy  BarbCallie Fielding  [place]/2022, 7:51 AM

## 2020-04-01 NOTE — Progress Notes (Signed)
Patient ID: Bryan [name], male   DOB: Oct 27, 1975, 45 y.o.   MRN: [id number]  This SW covering for assigned SW Becky Dupree.  SW ordered TTB as requested by OT. Order placed with [employer] [facility] via parachute.   Cecile Sheerer, MSW, LCSWA Office: [phone] Cell: [phone] Fax: [phone]

## 2020-04-01 NOTE — Progress Notes (Signed)
[facility] PHYSICAL MEDICINE & REHABILITATION PROGRESS NOTE  Subjective/Complaints: Patient seen laying in bed this AM.  He states he slept well overnight.  He denies complaints.  He appears slightly more alert and awake this morning  ROS: limited due to cognition, but appears to deny CP, shortness of breath, nausea, vomiting, diarrhea.  Objective: Vital Signs: Blood pressure 119/73, pulse 68, temperature 97.9 F (36.6 C), resp. rate 14, height 5\' 10"  (1.778 m), weight 91.2 kg, SpO2 96 %. No results found. No results for input(s): WBC, HGB, HCT, PLT in the last 72 hours. No results for input(s): NA, K, CL, CO2, GLUCOSE, BUN, CREATININE, CALCIUM in the last 72 hours.  Intake/Output Summary (Last 24 hours) at 04/01/2020 1223 Last data filed at 04/01/2020 0054 Gross per 24 hour  Intake 400 ml  Output 700 ml  Net -300 ml        Physical Exam: BP 119/73 (BP Location: Left Arm)   Pulse 68   Temp 97.9 F (36.6 C)   Resp 14   Ht 5\' 10"  (1.778 m)   Wt 91.2 kg   SpO2 96%   BMI 28.85 kg/m  Constitutional: No distress . Vital signs reviewed. HENT: Normocephalic.  Atraumatic. Eyes: EOMI. No discharge. Cardiovascular: No JVD.  RRR. Respiratory: Normal effort.  No stridor.  Bilateral clear to auscultation. GI: Non-distended.  BS +. Skin: Warm and dry.  Intact. Psych: Flat.  Slowed.,?  Some improvement Musc: No edema in extremities.  No tenderness in extremities. Neuro: Alert and oriented to person and place only, consistently believes it is 2020, unchanged Motor: 4-/5 throughout with apraxia, unchanged Poor attention/concentration  Assessment/Plan: 1. Functional deficits which require 3+ hours per day of interdisciplinary therapy in a comprehensive inpatient rehab setting.  Physiatrist is providing close team supervision and 24 hour management of active medical problems listed below.  Physiatrist and rehab team continue to assess barriers to discharge/monitor patient progress  toward functional and medical goals   Care Tool:  Bathing    Body parts bathed by patient: Chest,Face,Right upper leg,Left upper leg,Right arm,Left arm,Abdomen,Front perineal area,Left lower leg,Right lower leg   Body parts bathed by helper: Buttocks     Bathing assist Assist Level: Minimal Assistance - Patient > 75%     Upper Body Dressing/Undressing Upper body dressing   What is the patient wearing?: Pull over shirt    Upper body assist Assist Level: Minimal Assistance - Patient > 75%    Lower Body Dressing/Undressing Lower body dressing      What is the patient wearing?: Underwear/pull up,Pants     Lower body assist Assist for lower body dressing: Minimal Assistance - Patient > 75%     Toileting Toileting    Toileting assist Assist for toileting: Dependent - Patient 0%     Transfers Chair/bed transfer  Transfers assist     Chair/bed transfer assist level: Minimal Assistance - Patient > 75%     Locomotion Ambulation   Ambulation assist   Ambulation activity did not occur: Safety/medical concerns (fatigue, poor motor control/planning, weakness)  Assist level: Minimal Assistance - Patient > 75% Assistive device: Walker-rolling Max distance: 45ft   Walk 10 feet activity   Assist  Walk 10 feet activity did not occur: Safety/medical concerns (fatigue, poor motor control/planning, weakness)  Assist level: Minimal Assistance - Patient > 75% Assistive device: Walker-rolling   Walk 50 feet activity   Assist Walk 50 feet with 2 turns activity did not occur: Safety/medical concerns (fatigue, poor motor  control/planning, weakness)  Assist level: Minimal Assistance - Patient > 75% Assistive device: Walker-rolling    Walk 150 feet activity   Assist Walk 150 feet activity did not occur: Safety/medical concerns (fatigue, poor motor control/planning, weakness)         Walk 10 feet on uneven surface  activity   Assist Walk 10 feet on uneven  surfaces activity did not occur: Safety/medical concerns (fatigue, poor motor control/planning, weakness)         Wheelchair     Assist Will patient use wheelchair at discharge?: Yes Type of Wheelchair: Manual    Wheelchair assist level: Supervision/Verbal cueing Max wheelchair distance: 118ft    Wheelchair 50 feet with 2 turns activity    Assist        Assist Level: Supervision/Verbal cueing   Wheelchair 150 feet activity     Assist      Assist Level: Supervision/Verbal cueing    Medical Problem List and Plan: 1.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 Antithrombotics: -DVT/anticoagulation:Pharmaceutical:Lovenox -antiplatelet therapy: N/A 3. Pain Management:  Reported with activity per therapies  Tylenol scheduled on 1/26  Appears relatively controlled on 2/4 4. Mood:LCSW to follow for evaluation and support. -antipsychotic agents: See #11. 5. Neuropsych: This patientis notcapable of making decisions onhisown behalf. 6. Skin/Wound Care:Routine pressure relief measures. 7. Fluids/Electrolytes/Nutrition:Monitor I/Os.  8. T2DM with hyperglycemia: New diagnosis with A1C-7.1 on 1/19.  Carb modified diet.   Levemir decreased to 25 units  NovoLog decreased to 3 units  Metformin and meal coverage.   Use SSI for elevated BS   CBG (last 3)  Recent Labs    03/31/20 2239 04/01/20 0601 04/01/20 1132  GLUCAP 83 88 97   Controlled on 2/4 9.Acute blood loss anemia:Resolved  Hemoglobin 13.0 on 1/19 10. Elevated Triglycerides: 294 on 1/19 11. Psychosis d/o Schizophrenia. Last documented admission 11/20 (history of non-compliance)  Currently off Abiligy, Cogentin and Risperdal during hospitalization.   Continue Seroquel 100 nightly  Decreased Seroquel to 50 daily on 1/20 to improve daytime alertness, monitor Sleep wake chart.  Appreciate  psych recs-started on fluoxetine  Stable on 2/4 12.  Elevated Creatinine: Resolved  Continue to monitor 13. Hyponatremia  Na 135 on 1/31  Continue to monitor 14.  Acute blood loss anemia  Hemoglobin 12.6 on 1/31 15. Visual deficits-appears to be premorbid  Likely secondary to attention +/- diabetic  Stable  LOS: 17 days A FACE TO FACE EVALUATION WAS PERFORMED  Bryan [name] Karis Juba 04/01/2020, 12:23 PM

## 2020-04-02 LAB — GLUCOSE, CAPILLARY
Glucose-Capillary: 100 mg/dL — ABNORMAL HIGH (ref 70–99)
Glucose-Capillary: 83 mg/dL (ref 70–99)
Glucose-Capillary: 114 mg/dL — ABNORMAL HIGH (ref 70–99)
Glucose-Capillary: 58 mg/dL — ABNORMAL LOW (ref 70–99)
Glucose-Capillary: 111 mg/dL — ABNORMAL HIGH (ref 70–99)

## 2020-04-02 NOTE — Progress Notes (Signed)
Speech Language Pathology Daily Session Note  Patient Details  Name: Bryan [name] MRN: [id number] Date of Birth: 08/10/1975  Today's Date: 04/02/2020 SLP Individual Time: 9373-4287 SLP Individual Time Calculation (min): 30 min  Short Term Goals: Week 3: SLP Short Term Goal 1 (Week 3): STG=LTG due to short ELOS  Skilled Therapeutic Interventions: Pt seen for skilled ST with focus on cognitive goals. SLP facilitating orientation task by providing mod A visual and verbal cues to increase recall and carryover of MOY, DOW, date and recent medical events. Pt participating in simple reasoning task benefiting from min A verbal cues for 90% accuracy. Pt engaged in conversation and therapeutic tasks throughout session. Pt left in bed with alarm set and all needs within reach. Cont ST POC.   Pain Pain Assessment Pain Scale: 0-10 Pain Score: 0-No pain  Therapy/Group: Individual Therapy  Tacey Ruiz 04/02/2020, 11:16 AM

## 2020-04-02 NOTE — Progress Notes (Signed)
Hypoglycemic Event  CBG: 58  Treatment: 4 oz juice/soda  Symptoms: None  Follow-up CBG: Time:1722 CBG Result:114  Possible Reasons for Event: Unknown  Comments/MD notified:yes    Bryan [name]  Bryan [name]

## 2020-04-02 NOTE — Progress Notes (Signed)
Speech Language Pathology Daily Session Note  Patient Details  Name: Bryan [name] MRN: [id number] Date of Birth: [birth year]  Today's Date: 04/02/2020 SLP Individual Time: 1345-1430 SLP Individual Time Calculation (min): 45 min  Short Term Goals: Week 3: SLP Short Term Goal 1 (Week 3): STG=LTG due to short ELOS  Skilled Therapeutic Interventions: Skilled SLP intervention focused on cognition. Pt required moderate visual and verbal cues for initiation during coin sorting task. Increased time needed to complete this task. Pt identified 6/10 simple times shown on clocks with moderate visual and verbal cues. SLP informed patient about medical situation because he stated he did not remember. He demonstrated good awareness of call bell and purpose and recalled 3 tasks completed in previous SLP and PT sessions when asked. Pt continues to require cues for initiation however once given cues he participated well in skilled SLP tx. Pt left seated upright in wheelchair with call bell within reach and chair alarm on.   Pain Pain Assessment Pain Scale: Faces Pain Score: 0-No pain Faces Pain Scale: No hurt  Therapy/Group: Individual Therapy  Carlean Jews Raeley Gilmore 04/02/2020, 2:22 PM

## 2020-04-02 NOTE — Progress Notes (Signed)
[place] PHYSICAL MEDICINE & REHABILITATION PROGRESS NOTE  Subjective/Complaints: Patient seen laying in bed this morning.  He states he slept well overnight.  He denies complaints.  ROS: limited due to cognition, but appears to deny CP, shortness of breath, nausea, vomiting, diarrhea.  Objective: Vital Signs: Blood pressure 112/84, pulse 70, temperature 98.2 F (36.8 C), resp. rate 16, height 5\' 10"  (1.778 m), weight 91.2 kg, SpO2 94 %. No results found. No results for input(s): WBC, HGB, HCT, PLT in the last 72 hours. No results for input(s): NA, K, CL, CO2, GLUCOSE, BUN, CREATININE, CALCIUM in the last 72 hours.  Intake/Output Summary (Last 24 hours) at 04/02/2020 1408 Last data filed at 04/02/2020 0800 Gross per 24 hour  Intake 560 ml  Output 900 ml  Net -340 ml        Physical Exam: BP 112/84 (BP Location: Left Arm)   Pulse 70   Temp 98.2 F (36.8 C)   Resp 16   Ht 5\' 10"  (1.778 m)   Wt 91.2 kg   SpO2 94%   BMI 28.85 kg/m  Constitutional: No distress . Vital signs reviewed. HENT: Normocephalic.  Atraumatic. Eyes: EOMI. No discharge. Cardiovascular: No JVD.  RRR. Respiratory: Normal effort.  No stridor.  Bilateral clear to auscultation. GI: Non-distended.  BS +. Skin: Warm and dry.  Intact. Psych: Flat.  Slowed. Musc: No edema in extremities.  No tenderness in extremities. Neuro: Alert and oriented to person and place only, consistently believes it is 2020, unchanged Motor: 4-/5 throughout with apraxia, stable Poor attention/concentration, slow improvement  Assessment/Plan: 1. Functional deficits which require 3+ hours per day of interdisciplinary therapy in a comprehensive inpatient rehab setting.  Physiatrist is providing close team supervision and 24 hour management of active medical problems listed below.  Physiatrist and rehab team continue to assess barriers to discharge/monitor patient progress toward functional and medical goals   Care  Tool:  Bathing    Body parts bathed by patient: Chest,Face,Right upper leg,Left upper leg,Right arm,Left arm,Abdomen,Front perineal area,Left lower leg,Right lower leg   Body parts bathed by helper: Buttocks     Bathing assist Assist Level: Minimal Assistance - Patient > 75%     Upper Body Dressing/Undressing Upper body dressing   What is the patient wearing?: Pull over shirt    Upper body assist Assist Level: Minimal Assistance - Patient > 75%    Lower Body Dressing/Undressing Lower body dressing      What is the patient wearing?: Underwear/pull up,Pants     Lower body assist Assist for lower body dressing: Minimal Assistance - Patient > 75%     Toileting Toileting    Toileting assist Assist for toileting: Dependent - Patient 0%     Transfers Chair/bed transfer  Transfers assist     Chair/bed transfer assist level: Contact Guard/Touching assist     Locomotion Ambulation   Ambulation assist   Ambulation activity did not occur: Safety/medical concerns (fatigue, poor motor control/planning, weakness)  Assist level: Contact Guard/Touching assist Assistive device: Walker-rolling Max distance: 100   Walk 10 feet activity   Assist  Walk 10 feet activity did not occur: Safety/medical concerns (fatigue, poor motor control/planning, weakness)  Assist level: Contact Guard/Touching assist Assistive device: Walker-rolling   Walk 50 feet activity   Assist Walk 50 feet with 2 turns activity did not occur: Safety/medical concerns (fatigue, poor motor control/planning, weakness)  Assist level: Contact Guard/Touching assist Assistive device: Walker-rolling    Walk 150 feet activity  Assist Walk 150 feet activity did not occur: Safety/medical concerns (fatigue, poor motor control/planning, weakness)         Walk 10 feet on uneven surface  activity   Assist Walk 10 feet on uneven surfaces activity did not occur: Safety/medical concerns (fatigue,  poor motor control/planning, weakness)         Wheelchair     Assist Will patient use wheelchair at discharge?: Yes Type of Wheelchair: Manual    Wheelchair assist level: Supervision/Verbal cueing Max wheelchair distance: 187ft    Wheelchair 50 feet with 2 turns activity    Assist        Assist Level: Supervision/Verbal cueing   Wheelchair 150 feet activity     Assist      Assist Level: Supervision/Verbal cueing    Medical Problem List and Plan: 1.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 Antithrombotics: -DVT/anticoagulation:Pharmaceutical:Lovenox -antiplatelet therapy: N/A 3. Pain Management:  Reported with activity per therapies  Tylenol scheduled on 1/26  Appears relatively controlled on 2/5 4. Mood:LCSW to follow for evaluation and support. -antipsychotic agents: See #11. 5. Neuropsych: This patientis notcapable of making decisions onhisown behalf. 6. Skin/Wound Care:Routine pressure relief measures. 7. Fluids/Electrolytes/Nutrition:Monitor I/Os.  8. T2DM with hyperglycemia: New diagnosis with A1C-7.1 on 1/19.  Carb modified diet.   Levemir decreased to 25 units  NovoLog decreased to 3 units  Metformin and meal coverage.   Use SSI for elevated BS   CBG (last 3)  Recent Labs    04/01/20 2145 04/02/20 0614 04/02/20 1144  GLUCAP 88 100* 83   Controlled on 2/5 9.Acute blood loss anemia:Resolved  Hemoglobin 13.0 on 1/19 10. Elevated Triglycerides: 294 on 1/19 11. Psychosis d/o Schizophrenia. Last documented admission 11/20 (history of non-compliance)  Currently off Abiligy, Cogentin and Risperdal during hospitalization.   Continue Seroquel 100 nightly  Decreased Seroquel to 50 daily on 1/20 to improve daytime alertness, monitor Sleep wake chart.  Appreciate psych recs-started on fluoxetine  Stable on 2/5 12.  Elevated  Creatinine: Resolved  Continue to monitor 13. Hyponatremia  Na 135 on 1/31, labs ordered for Monday  Continue to monitor 14.  Acute blood loss anemia  Hemoglobin 12.6 on 1/31 15. Visual deficits-appears to be premorbid  Likely secondary to attention +/- diabetic  Stable  LOS: 18 days A FACE TO FACE EVALUATION WAS PERFORMED  Suan Pyeatt Karis Juba 04/02/2020, 2:08 PM

## 2020-04-02 NOTE — Progress Notes (Signed)
Physical Therapy Session Note  Patient Details  Name: Bryan [name] MRN: [id number] Date of Birth: 1976-01-11  Today's Date: 04/02/2020 PT Individual Time: 0900-0930 PT Individual Time Calculation (min): 30 min   Short Term Goals: Week 1:  PT Short Term Goal 1 (Week 1): pt will perform all bed mobility with min A overall PT Short Term Goal 1 - Progress (Week 1): Progressing toward goal PT Short Term Goal 2 (Week 1): pt will transfer sit<>stand with LRAD and min A PT Short Term Goal 2 - Progress (Week 1): Progressing toward goal PT Short Term Goal 3 (Week 1): Pt will initiate gait training with LRAD PT Short Term Goal 3 - Progress (Week 1): Met Week 2:  PT Short Term Goal 1 (Week 2): pt will perform all bed mobility with min A overall PT Short Term Goal 1 - Progress (Week 2): Met PT Short Term Goal 2 (Week 2): pt will transfer sit<>stand with LRAD and min A PT Short Term Goal 2 - Progress (Week 2): Progressing toward goal PT Short Term Goal 3 (Week 2): pt will ambulate 80f with LRAD and mod A of 1 PT Short Term Goal 3 - Progress (Week 2): Met  Skilled Therapeutic Interventions/Progress Updates:    Patient supine in bed upon PT arrival. Patient initially asleep but is easily aroused and quickly alert and agreeable to PT session. Patient denied pain throughout session.  Therapeutic Activity: Bed Mobility: Patient performed supine to/from sit with supervision. No formal cues provided though pt requires time to complete.  Transfers: Patient performed sit to/from stand throughout session with CGA and vc for safe hand placement for controlling ascent and descent. SPVT performed to/ from various surfaces and heights including low height. When questioned on when he knows when to sit, he responds that he needs to feel the seat on the backs of his legs and reaches back for armrest at appropriate moment. Does require Min A to control descent to low seat but is able to rise to stand from low seat  height with extra effort and close SBA.   Gait Training:  Patient ambulated 131 x1/ 786 x2 using RW and CGA with up to MGreensborofor safety/ balance. Demos hard flat foot strike bilaterally with limited weight shift to R and crowding to L side of walker throughout. Also tends to drag L foot d/t decreased L knee flexion. He requires vc initially and with increase in fatigue requires vc/ tc for increasing weight shift to R side and increasing L knee flexion for improved quality of gait. Verbally cued for turns to L/ R with correct directionality and CGA to complete for minimally wide turn completion.    Patient supine in bed at end of session with brakes locked, bed alarm set, and all needs within reach.   Therapy Documentation Precautions:  Precautions Precautions: Fall Required Braces or Orthoses: Other Brace Other Brace: L wrist cock up splint to be worn during day per RN - per [facility] care chart (did not have during CIR eval) Restrictions Weight Bearing Restrictions: No  Therapy/Group: Individual Therapy  JAlger Simons2/06/2020, 1:42 PM

## 2020-04-03 LAB — GLUCOSE, CAPILLARY
Glucose-Capillary: 90 mg/dL (ref 70–99)
Glucose-Capillary: 112 mg/dL — ABNORMAL HIGH (ref 70–99)
Glucose-Capillary: 69 mg/dL — ABNORMAL LOW (ref 70–99)
Glucose-Capillary: 95 mg/dL (ref 70–99)
Glucose-Capillary: 100 mg/dL — ABNORMAL HIGH (ref 70–99)

## 2020-04-03 NOTE — Progress Notes (Signed)
Slept ok, Flat affect. Patient rarely calls for assistance, needs anticipated by staff. C/O back pain, scheduled tylenol seems to manage his pain. LBM 02/03.Bryan [name] A

## 2020-04-03 NOTE — Progress Notes (Signed)
[place] PHYSICAL MEDICINE & REHABILITATION PROGRESS NOTE  Subjective/Complaints: Patient seen laying in bed this morning.  He states he slept well overnight.  No reported issues overnight.  Still unable to state the correct year.  ROS: limited due to cognition, but appears to deny CP, shortness of breath, nausea, vomiting, diarrhea.  Objective: Vital Signs: Blood pressure 112/84, pulse 72, temperature 98.8 F (37.1 C), temperature source Oral, resp. rate 16, height 5\' 10"  (1.778 m), weight 91.2 kg, SpO2 97 %. No results found. No results for input(s): WBC, HGB, HCT, PLT in the last 72 hours. No results for input(s): NA, K, CL, CO2, GLUCOSE, BUN, CREATININE, CALCIUM in the last 72 hours.  Intake/Output Summary (Last 24 hours) at 04/03/2020 0902 Last data filed at 04/03/2020 0440 Gross per 24 hour  Intake 600 ml  Output 950 ml  Net -350 ml        Physical Exam: BP 112/84   Pulse 72   Temp 98.8 F (37.1 C) (Oral)   Resp 16   Ht 5\' 10"  (1.778 m)   Wt 91.2 kg   SpO2 97%   BMI 28.85 kg/m  Constitutional: No distress . Vital signs reviewed. HENT: Normocephalic.  Atraumatic. Eyes: EOMI. No discharge. Cardiovascular: No JVD.  RRR. Respiratory: Normal effort.  No stridor.  Bilateral clear to auscultation. GI: Non-distended.  BS +. Skin: Warm and dry.  Intact. Psych: Flat.  Slowed. Musc: No edema in extremities.  No tenderness in extremities. Neuro: Alert and oriented to person and place only, consistently believes it is 2020, stable  Motor: 4-/5 throughout with apraxia, unchanged Poor attention/concentration, slow improvement  Assessment/Plan: 1. Functional deficits which require 3+ hours per day of interdisciplinary therapy in a comprehensive inpatient rehab setting.  Physiatrist is providing close team supervision and 24 hour management of active medical problems listed below.  Physiatrist and rehab team continue to assess barriers to discharge/monitor patient progress  toward functional and medical goals   Care Tool:  Bathing    Body parts bathed by patient: Chest,Face,Right upper leg,Left upper leg,Right arm,Left arm,Abdomen,Front perineal area,Left lower leg,Right lower leg   Body parts bathed by helper: Buttocks     Bathing assist Assist Level: Minimal Assistance - Patient > 75%     Upper Body Dressing/Undressing Upper body dressing   What is the patient wearing?: Pull over shirt    Upper body assist Assist Level: Minimal Assistance - Patient > 75%    Lower Body Dressing/Undressing Lower body dressing      What is the patient wearing?: Underwear/pull up,Pants     Lower body assist Assist for lower body dressing: Minimal Assistance - Patient > 75%     Toileting Toileting    Toileting assist Assist for toileting: Dependent - Patient 0%     Transfers Chair/bed transfer  Transfers assist     Chair/bed transfer assist level: Contact Guard/Touching assist     Locomotion Ambulation   Ambulation assist   Ambulation activity did not occur: Safety/medical concerns (fatigue, poor motor control/planning, weakness)  Assist level: Contact Guard/Touching assist Assistive device: Walker-rolling Max distance: 100   Walk 10 feet activity   Assist  Walk 10 feet activity did not occur: Safety/medical concerns (fatigue, poor motor control/planning, weakness)  Assist level: Contact Guard/Touching assist Assistive device: Walker-rolling   Walk 50 feet activity   Assist Walk 50 feet with 2 turns activity did not occur: Safety/medical concerns (fatigue, poor motor control/planning, weakness)  Assist level: Contact Guard/Touching assist Assistive device:  Walker-rolling    Walk 150 feet activity   Assist Walk 150 feet activity did not occur: Safety/medical concerns (fatigue, poor motor control/planning, weakness)         Walk 10 feet on uneven surface  activity   Assist Walk 10 feet on uneven surfaces activity did  not occur: Safety/medical concerns (fatigue, poor motor control/planning, weakness)         Wheelchair     Assist Will patient use wheelchair at discharge?: Yes Type of Wheelchair: Manual    Wheelchair assist level: Supervision/Verbal cueing Max wheelchair distance: 168ft    Wheelchair 50 feet with 2 turns activity    Assist        Assist Level: Supervision/Verbal cueing   Wheelchair 150 feet activity     Assist      Assist Level: Supervision/Verbal cueing    Medical Problem List and Plan: 1.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 Antithrombotics: -DVT/anticoagulation:Pharmaceutical:Lovenox -antiplatelet therapy: N/A 3. Pain Management:  Reported with activity per therapies  Tylenol scheduled on 1/26  Appears relatively controlled on 2/6 4. Mood:LCSW to follow for evaluation and support. -antipsychotic agents: See #11. 5. Neuropsych: This patientis notcapable of making decisions onhisown behalf. 6. Skin/Wound Care:Routine pressure relief measures. 7. Fluids/Electrolytes/Nutrition:Monitor I/Os.  8. T2DM with hyperglycemia: New diagnosis with A1C-7.1 on 1/19.  Carb modified diet.   Levemir decreased to 25 units  NovoLog decreased to 3 units  Metformin and meal coverage.   Use SSI for elevated BS   CBG (last 3)  Recent Labs    04/02/20 1722 04/02/20 2119 04/03/20 0618  GLUCAP 114* 111* 90   Relatively controlled on 2/6 9.Acute blood loss anemia:Resolved  Hemoglobin 13.0 on 1/19 10. Elevated Triglycerides: 294 on 1/19 11. Psychosis d/o Schizophrenia. Last documented admission 11/20 (history of non-compliance)  Currently off Abiligy, Cogentin and Risperdal during hospitalization.   Continue Seroquel 100 nightly  Decreased Seroquel to 50 daily on 1/20 to improve daytime alertness, monitor Sleep wake chart.  Appreciate psych  recs-started on fluoxetine  Stable on 2/6 12.  Elevated Creatinine: Resolved  Continue to monitor 13. Hyponatremia  Na 135 on 1/31, labs ordered for tomorrow  Continue to monitor 14.  Acute blood loss anemia  Hemoglobin 12.6 on 1/31 15. Visual deficits-appears to be premorbid  Likely secondary to attention +/- diabetic  Stable  LOS: 19 days A FACE TO FACE EVALUATION WAS PERFORMED  Angelli Baruch Karis Juba 04/03/2020, 9:02 AM

## 2020-04-04 LAB — CBC
HCT: 39.7 % (ref 39.0–52.0)
Hemoglobin: 12.7 g/dL — ABNORMAL LOW (ref 13.0–17.0)
MCH: 29.6 pg (ref 26.0–34.0)
MCHC: 32 g/dL (ref 30.0–36.0)
MCV: 92.5 fL (ref 80.0–100.0)
Platelets: 279 10*3/uL (ref 150–400)
RBC: 4.29 MIL/uL (ref 4.22–5.81)
RDW: 14.7 % (ref 11.5–15.5)
WBC: 4.8 10*3/uL (ref 4.0–10.5)
nRBC: 0 % (ref 0.0–0.2)

## 2020-04-04 LAB — BASIC METABOLIC PANEL
Anion gap: 9 (ref 5–15)
BUN: 10 mg/dL (ref 6–20)
CO2: 23 mmol/L (ref 22–32)
Calcium: 10 mg/dL (ref 8.9–10.3)
Chloride: 103 mmol/L (ref 98–111)
Creatinine, Ser: 0.89 mg/dL (ref 0.61–1.24)
GFR, Estimated: >60 mL/min (ref >60)
Glucose, Bld: 107 mg/dL — ABNORMAL HIGH (ref 70–99)
Potassium: 4.1 mmol/L (ref 3.5–5.1)
Sodium: 135 mmol/L (ref 135–145)

## 2020-04-04 LAB — GLUCOSE, CAPILLARY
Glucose-Capillary: 109 mg/dL — ABNORMAL HIGH (ref 70–99)
Glucose-Capillary: 104 mg/dL — ABNORMAL HIGH (ref 70–99)
Glucose-Capillary: 101 mg/dL — ABNORMAL HIGH (ref 70–99)
Glucose-Capillary: 111 mg/dL — ABNORMAL HIGH (ref 70–99)

## 2020-04-04 MED ORDER — INSULIN STARTER KIT- PEN NEEDLES (ENGLISH)
1.0000 | Freq: Once | Status: AC
  Administered 2020-04-04: 1
  Filled 2020-04-04: qty 1

## 2020-04-04 NOTE — Progress Notes (Signed)
Slept good. Needs to be prompted to void. Left heel elevated off bed onto pillow. Foam dressing in place. Old blister to left lateral heel, basically healed. Agreed to take tylenol at HS, complained of lower back pain. LBM 02/06. Uses stedy to go to BR to void. Occasionally incontinent of urine. Very flat affect, poor initiation. Alfredo Martinez A

## 2020-04-04 NOTE — Progress Notes (Addendum)
[place] PHYSICAL MEDICINE & REHABILITATION PROGRESS NOTE  Subjective/Complaints: No complaints this morning. Denies pain, constipation, insomnia.  Working with SLP  ROS: limited due to cognition, but appears to deny CP, shortness of breath, nausea, vomiting, diarrhea.  Objective: Vital Signs: Blood pressure (!) 118/94, pulse 67, temperature 98.4 F (36.9 C), resp. rate 16, height 5\' 10"  (1.778 m), weight 91.2 kg, SpO2 97 %. No results found. Recent Labs    04/04/20 0635  WBC 4.8  HGB 12.7*  HCT 39.7  PLT 279   Recent Labs    04/04/20 0635  NA 135  K 4.1  CL 103  CO2 23  GLUCOSE 107*  BUN 10  CREATININE 0.89  CALCIUM 10.0    Intake/Output Summary (Last 24 hours) at 04/04/2020 1338 Last data filed at 04/04/2020 0846 Gross per 24 hour  Intake 836 ml  Output 675 ml  Net 161 ml        Physical Exam: BP (!) 118/94   Pulse 67   Temp 98.4 F (36.9 C)   Resp 16   Ht 5\' 10"  (1.778 m)   Wt 91.2 kg   SpO2 97%   BMI 28.85 kg/m  Gen: no distress, normal appearing HEENT: oral mucosa pink and moist, NCAT Cardio: Reg rate Chest: normal effort, normal rate of breathing Abd: soft, non-distended Ext: no edema Psych: Flat.  Slowed. Musc: No edema in extremities.  No tenderness in extremities. Neuro: Alert and oriented to person and place only, consistently believes it is 2020, stable  Motor: 4-/5 throughout with apraxia, unchanged Poor attention/concentration, slow improvement  Assessment/Plan: 1. Functional deficits which require 3+ hours per day of interdisciplinary therapy in a comprehensive inpatient rehab setting.  Physiatrist is providing close team supervision and 24 hour management of active medical problems listed below.  Physiatrist and rehab team continue to assess barriers to discharge/monitor patient progress toward functional and medical goals   Care Tool:  Bathing    Body parts bathed by patient: Chest,Face,Right upper leg,Left upper leg,Right  arm,Left arm,Abdomen,Front perineal area,Left lower leg,Right lower leg   Body parts bathed by helper: Buttocks     Bathing assist Assist Level: Minimal Assistance - Patient > 75%     Upper Body Dressing/Undressing Upper body dressing   What is the patient wearing?: Pull over shirt    Upper body assist Assist Level: Minimal Assistance - Patient > 75%    Lower Body Dressing/Undressing Lower body dressing      What is the patient wearing?: Underwear/pull up,Pants     Lower body assist Assist for lower body dressing: Minimal Assistance - Patient > 75%     Toileting Toileting    Toileting assist Assist for toileting: Dependent - Patient 0%     Transfers Chair/bed transfer  Transfers assist     Chair/bed transfer assist level: Contact Guard/Touching assist     Locomotion Ambulation   Ambulation assist   Ambulation activity did not occur: Safety/medical concerns (fatigue, poor motor control/planning, weakness)  Assist level: Contact Guard/Touching assist Assistive device: Walker-rolling Max distance: 100   Walk 10 feet activity   Assist  Walk 10 feet activity did not occur: Safety/medical concerns (fatigue, poor motor control/planning, weakness)  Assist level: Contact Guard/Touching assist Assistive device: Walker-rolling   Walk 50 feet activity   Assist Walk 50 feet with 2 turns activity did not occur: Safety/medical concerns (fatigue, poor motor control/planning, weakness)  Assist level: Contact Guard/Touching assist Assistive device: Walker-rolling    Walk 150 feet  activity   Assist Walk 150 feet activity did not occur: Safety/medical concerns (fatigue, poor motor control/planning, weakness)         Walk 10 feet on uneven surface  activity   Assist Walk 10 feet on uneven surfaces activity did not occur: Safety/medical concerns (fatigue, poor motor control/planning, weakness)         Wheelchair     Assist Will patient use  wheelchair at discharge?: Yes Type of Wheelchair: Manual    Wheelchair assist level: Supervision/Verbal cueing Max wheelchair distance: 119ft    Wheelchair 50 feet with 2 turns activity    Assist        Assist Level: Supervision/Verbal cueing   Wheelchair 150 feet activity     Assist      Assist Level: Supervision/Verbal cueing    Medical Problem List and Plan: 1.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 Antithrombotics: -DVT/anticoagulation:Pharmaceutical:Lovenox -antiplatelet therapy: N/A 3. Pain Management:  Reported with activity per therapies  Tylenol scheduled on 1/26  Appears relatively controlled on 2/6 4. Mood:LCSW to follow for evaluation and support. -antipsychotic agents: See #11. 5. Neuropsych: This patientis notcapable of making decisions onhisown behalf. 6. Skin/Wound Care:Routine pressure relief measures. 7. Fluids/Electrolytes/Nutrition:Monitor I/Os.  8. T2DM with hyperglycemia: New diagnosis with A1C-7.1 on 1/19.  Carb modified diet.   Levemir decreased to 25 units  NovoLog decreased to 3 units  Continue Metformin and meal coverage.   Use SSI for elevated BS   CBG (last 3)  Recent Labs    04/03/20 2130 04/04/20 0613 04/04/20 1128  GLUCAP 100* 109* 104*   Slightly elevated 2/7- continue to monitor.  9.Acute blood loss anemia:Resolved  Hemoglobin 13.0 on 1/19 10. Elevated Triglycerides: 294 on 1/19 11. Psychosis d/o Schizophrenia. Last documented admission 11/20 (history of non-compliance)  Currently off Abiligy, Cogentin and Risperdal during hospitalization.   Continue Seroquel 100 nightly  Decreased Seroquel to 50 daily on 1/20 to improve daytime alertness, monitor Sleep wake chart.  Appreciate psych recs-started on fluoxetine  Stable on 2/6 12.  Elevated Creatinine: Resolved  Continue to monitor 13.  Hyponatremia  Na 135 on 1/31, 135 on 2/7  Continue to monitor 14.  Acute blood loss anemia  Hemoglobin 12.6 on 1/31, 12.7 on 2/7 15. Visual deficits-appears to be premorbid  Likely secondary to attention +/- diabetic  Stable  LOS: 20 days A FACE TO FACE EVALUATION WAS PERFORMED  Clint Bolder P Rahkeem Senft 04/04/2020, 1:38 PM

## 2020-04-04 NOTE — Progress Notes (Signed)
Physical Therapy Session Note  Patient Details  Name: Bryan [name] MRN: [id number] Date of Birth: Jul 18, 1975  Today's Date: 04/04/2020 PT Individual Time: 3474-2595 PT Individual Time Calculation (min): 69 min   Short Term Goals: Week 2:  PT Short Term Goal 1 (Week 2): pt will perform all bed mobility with min A overall PT Short Term Goal 1 - Progress (Week 2): Met PT Short Term Goal 2 (Week 2): pt will transfer sit<>stand with LRAD and min A PT Short Term Goal 2 - Progress (Week 2): Progressing toward goal PT Short Term Goal 3 (Week 2): pt will ambulate 38f with LRAD and mod A of 1 PT Short Term Goal 3 - Progress (Week 2): Met Week 3:  PT Short Term Goal 1 (Week 3): STG=LTG due to LOS  Skilled Therapeutic Interventions/Progress Updates:   Received pt supine in bed asleep, upon wakening pt agreeable to therapy, and denied any pain during session. Session with emphasis on functional mobility/transfers, generalized strengthening, dynamic standing balance/coordination, stair navigation, ambulation, NMR, motor control/sequencing, and improved activity tolerance. Pt transferred supine<>sitting EOB from flat bed with supervision and use of bedrails and transferred bed<>WC stand<>pivot with RW and min A. Pt performed WC mobility 1523fusing BUE and increased time with supervision to therapy gym and navigated 4 steps with 2 rails and min A ascending and descending with a step through pattern. Pt demonstrated bilateral LE ataxia L>R and with increased difficulty placing foot on step. Pt reported urge to use restroom and transferred WC<> regular toilet with RW and min A. Pt required max A to doff clothing and able to void and with medium BM. Pt transferred sit<>stand from toilet with min A and required total A for peri-care and mod/max A to pull pants over hips. Pt transported to dayroom and transferred WC<>Nustep with RW and min A with cues for turning technique and sequencing. Pt performed BUE/LE  strengthening on Nustep at workload 3 for 10 minutes for a total of 201 steps for improved cardiovascular endurance. Pt ambulated 18068fith RW and CGA/min A. Pt required multiple standing rest breaks and demonstrated flexed trunk, BLE ataxia, decreased bilateral foot clearance, and decreased L lateral weight shifting. Pt transferred sit<>stand at table in dayroom with min A and worked on dynamic standing balance, reaching outside BOS, fine motor control, and lateral weight shifting unstacking cones, reaching across midline, and re-stacking cones with CGA for balance x 2 trials with min cues as pt demonstrated dysmetria when reaching for cones. Pt transported back to room in [facility] A and requested to return to bed. Pt transferred WC<>bed stand<>pivot with RW and min A and sit<>supine with supervision. Concluded session with pt supine in bed, needs within reach, and bed alarm on. Therapist provided fresh drink for pt.   Therapy Documentation Precautions:  Precautions Precautions: Fall Required Braces or Orthoses: Other Brace Other Brace: L wrist cock up splint to be worn during day per RN - per [facility] care chart (did not have during CIR eval) Restrictions Weight Bearing Restrictions: No  Therapy/Group: Individual Therapy AnnAlfonse Alpers, DPT   04/04/2020, 7:32 AM

## 2020-04-04 NOTE — Progress Notes (Signed)
Occupational Therapy Session Note  Patient Details  Name: Cordai Rodrigue MRN: [id number] Date of Birth: 03/17/1975  Today's Date: 04/04/2020 OT Individual Time: 3532-9924 and 1033-1100 OT Individual Time Calculation (min): 60 min and 27   Short Term Goals: Week 3:  OT Short Term Goal 1 (Week 3): STG = LTGs due to remaining LOS  Skilled Therapeutic Interventions/Progress Updates:    1) Treatment session with focus on self-care retraining, dynamic standing balance, and functional transfers.  Pt received upright in bed being fed breakfast by nurse tech.  Discussed encouraging pt to attempt to feed self or at least having assist to scoop utensil and then feed self - nurse tech reports plan to attempt during lunch and notify therapist.  Pt agreeable to shower this session.  Completed bed mobility CGA to come to sitting EOB.  Mod assist for initial sit > stand from EOB but then min assist for all remaining sit > stand.  Min assist to [facility] for stand pivot transfers with RW with mod cues for sequencing due to decreased recall of sequencing transfer with RW.  Pt completed bathing at sit > stand level in ADL apt bathroom to simulate home setup.  Pt completed all bathing, with exception of his buttocks, with mod multimodal cues for sequencing and thoroughness.  Pt completed sit > stand in shower with min assist, therapist noting increased truncal ataxia and BLE ataxia with sit > stand in shower.  Pt reports unable to wash buttocks, most likely due to instability, therefore therapist washed buttocks while providing CGA to min assist for standing balance.  Pt completed UB dressing with min assist and increased time due to initially threading RUE through head hole due to decreased vision and attention.  Pt required min assist to thread LLE through underwear and pants, then pt able to thread RLE and complete sit > stand with CGA and pull pants over hips while standing with CGA.  Pt returned to room and remained upright  in w/c with seat belt alarm on and all needs in reach.  2) Treatment session with focus on visual scanning and focused to sustained attention to task.  Pt received upright in w/c, unable to recall having just completed SLP session.  Engaged in visual scanning task with focus on replication of ABAB peg hole pattern with pt initially requiring max multimodal cues, able to fade to mod cues.  Pt able to identify correct colors 75% of time. Pt requires increased cues to locate peg holes.  Pt returned to room and remained upright in w/c with seat belt alarm on and all needs in reach.  Therapy Documentation Precautions:  Precautions Precautions: Fall Required Braces or Orthoses: Other Brace Other Brace: L wrist cock up splint to be worn during day per RN - per [facility] care chart (did not have during CIR eval) Restrictions Weight Bearing Restrictions: No Pain: Pt with no c/o pain in 1st or 2nd OT session  Therapy/Group: Individual Therapy  Rosalio Loud 04/04/2020, 12:18 PM

## 2020-04-04 NOTE — Progress Notes (Signed)
Speech Language Pathology Daily Session Note  Patient Details  Name: Bryan [name] MRN: [id number] Date of Birth: [birth year]  Today's Date: 04/04/2020 SLP Individual Time: 1000-1029 SLP Individual Time Calculation (min): 29 min  Short Term Goals: Week 3: SLP Short Term Goal 1 (Week 3): STG=LTG due to short ELOS  Skilled Therapeutic Interventions: Pt seen for skilled ST with focus on cognitive goals, pt sitting upright in w/c. SLP facilitating card sorting task by providing mod verbal cues to recall directions and increase sustained attention. Pt visual disturbances impacting activity, pt states since his hospitalization he has difficulty seeing colors. He states when he looks at colors they start to change. Pt able to put cards in order 1-10 with mod verbal cues. Pt provided ongoing education regarding upcoming d/c date and strategies to increase safety at home. Pt states he wants to "get better at socializing" at discharge. Pt and SLP brainstorming safe activities outside of home with 24/7 supervision (going to movies, eating at pizza restaurants, etc). Pt left up in w/c with alarm on and all needs within reach. Cont ST POC.  Pain Pain Assessment Pain Scale: 0-10 Pain Score: 0-No pain  Therapy/Group: Individual Therapy  Tacey Ruiz 04/04/2020, 10:30 AM

## 2020-04-05 DIAGNOSIS — F200 Paranoid schizophrenia: Secondary | ICD-10-CM

## 2020-04-05 LAB — GLUCOSE, CAPILLARY
Glucose-Capillary: 104 mg/dL — ABNORMAL HIGH (ref 70–99)
Glucose-Capillary: 94 mg/dL (ref 70–99)
Glucose-Capillary: 78 mg/dL (ref 70–99)
Glucose-Capillary: 113 mg/dL — ABNORMAL HIGH (ref 70–99)

## 2020-04-05 NOTE — Progress Notes (Signed)
[place] PHYSICAL MEDICINE & REHABILITATION PROGRESS NOTE  Subjective/Complaints: Patient seen laying in bed this morning.  He states he slept well overnight.  No reported issues overnight.  ROS: limited due to cognition, but appears to deny CP, shortness of breath, nausea, vomiting, diarrhea.  Objective: Vital Signs: Blood pressure 111/78, pulse 73, temperature 98.3 F (36.8 C), resp. rate 16, height 5\' 10"  (1.778 m), weight 91.2 kg, SpO2 96 %. No results found. Recent Labs    04/04/20 0635  WBC 4.8  HGB 12.7*  HCT 39.7  PLT 279   Recent Labs    04/04/20 0635  NA 135  K 4.1  CL 103  CO2 23  GLUCOSE 107*  BUN 10  CREATININE 0.89  CALCIUM 10.0    Intake/Output Summary (Last 24 hours) at 04/05/2020 0850 Last data filed at 04/05/2020 06/03/2020 Gross per 24 hour  Intake 640 ml  Output 550 ml  Net 90 ml        Physical Exam: BP 111/78 (BP Location: Right Arm)   Pulse 73   Temp 98.3 F (36.8 C)   Resp 16   Ht 5\' 10"  (1.778 m)   Wt 91.2 kg   SpO2 96%   BMI 28.85 kg/m  Constitutional: No distress . Vital signs reviewed. HENT: Normocephalic.  Atraumatic. Eyes: EOMI. No discharge. Cardiovascular: No JVD.  RRR. Respiratory: Normal effort.  No stridor.  Bilateral clear to auscultation. GI: Non-distended.  BS +. Skin: Warm and dry.  Intact. Psych: Flat.  Slowed. Musc: No edema in extremities.  No tenderness in extremities. Neuro: Alert and oriented to person and place only, consistently believes it is 2020, unchanged Motor: 4-/5 throughout with apraxia, stable Poor attention/concentration, slow improvement  Assessment/Plan: 1. Functional deficits which require 3+ hours per day of interdisciplinary therapy in a comprehensive inpatient rehab setting.  Physiatrist is providing close team supervision and 24 hour management of active medical problems listed below.  Physiatrist and rehab team continue to assess barriers to discharge/monitor patient progress toward  functional and medical goals   Care Tool:  Bathing    Body parts bathed by patient: Chest,Face,Right upper leg,Left upper leg,Right arm,Left arm,Abdomen,Front perineal area,Left lower leg,Right lower leg   Body parts bathed by helper: Buttocks     Bathing assist Assist Level: Minimal Assistance - Patient > 75%     Upper Body Dressing/Undressing Upper body dressing   What is the patient wearing?: Pull over shirt    Upper body assist Assist Level: Minimal Assistance - Patient > 75%    Lower Body Dressing/Undressing Lower body dressing      What is the patient wearing?: Underwear/pull up,Pants     Lower body assist Assist for lower body dressing: Minimal Assistance - Patient > 75%     Toileting Toileting    Toileting assist Assist for toileting: Dependent - Patient 0%     Transfers Chair/bed transfer  Transfers assist     Chair/bed transfer assist level: Contact Guard/Touching assist     Locomotion Ambulation   Ambulation assist   Ambulation activity did not occur: Safety/medical concerns (fatigue, poor motor control/planning, weakness)  Assist level: Minimal Assistance - Patient > 75% Assistive device: Walker-rolling Max distance: 126ft   Walk 10 feet activity   Assist  Walk 10 feet activity did not occur: Safety/medical concerns (fatigue, poor motor control/planning, weakness)  Assist level: Minimal Assistance - Patient > 75% Assistive device: Walker-rolling   Walk 50 feet activity   Assist Walk 50 feet with  2 turns activity did not occur: Safety/medical concerns (fatigue, poor motor control/planning, weakness)  Assist level: Minimal Assistance - Patient > 75% Assistive device: Walker-rolling    Walk 150 feet activity   Assist Walk 150 feet activity did not occur: Safety/medical concerns (fatigue, poor motor control/planning, weakness)  Assist level: Minimal Assistance - Patient > 75% Assistive device: Walker-rolling    Walk 10 feet  on uneven surface  activity   Assist Walk 10 feet on uneven surfaces activity did not occur: Safety/medical concerns (fatigue, poor motor control/planning, weakness)         Wheelchair     Assist Will patient use wheelchair at discharge?: Yes Type of Wheelchair: Manual    Wheelchair assist level: Supervision/Verbal cueing Max wheelchair distance: 137ft    Wheelchair 50 feet with 2 turns activity    Assist        Assist Level: Supervision/Verbal cueing   Wheelchair 150 feet activity     Assist      Assist Level: Supervision/Verbal cueing    Medical Problem List and Plan: 1.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 Antithrombotics: -DVT/anticoagulation:Pharmaceutical:Lovenox -antiplatelet therapy: N/A 3. Pain Management:  Reported with activity per therapies  Tylenol scheduled on 1/26  Appears relatively controlled on 2/8 4. Mood:LCSW to follow for evaluation and support. -antipsychotic agents: See #11. 5. Neuropsych: This patientis notcapable of making decisions onhisown behalf. 6. Skin/Wound Care:Routine pressure relief measures. 7. Fluids/Electrolytes/Nutrition:Monitor I/Os.  8. T2DM with hyperglycemia: New diagnosis with A1C-7.1 on 1/19.  Carb modified diet.   Levemir decreased to 25 units  NovoLog decreased to 3 units  Continue Metformin and meal coverage.   Use SSI for elevated BS   CBG (last 3)  Recent Labs    04/04/20 1705 04/04/20 2115 04/05/20 0610  GLUCAP 101* 111* 104*   Relatively controlled on 2/8 9.Acute blood loss anemia:Resolved  Hemoglobin 13.0 on 1/19 10. Elevated Triglycerides: 294 on 1/19 11. Psychosis d/o Schizophrenia. Last documented admission 11/20 (history of non-compliance)  Currently off Abiligy, Cogentin and Risperdal during hospitalization.   Continue Seroquel 100 nightly  Decreased Seroquel to 50 daily on 1/20  to improve daytime alertness, monitor Sleep wake chart.  Appreciate psych recs-started on fluoxetine  Stable on 2/8 12.  Elevated Creatinine: Resolved  Continue to monitor 13. Hyponatremia  Na 135 on 2/8  Continue to monitor 14.  Acute blood loss anemia  Hemoglobin 12.7 on 2/7 15. Visual deficits-appears to be premorbid  Likely secondary to attention +/- diabetic  Stable  LOS: 21 days A FACE TO FACE EVALUATION WAS PERFORMED  Hammond Obeirne Karis Juba 04/05/2020, 8:50 AM

## 2020-04-05 NOTE — Plan of Care (Signed)
  Problem: RH Ambulation Goal: LTG Patient will ambulate in controlled environment (PT) Description: LTG: Patient will ambulate in a controlled environment, # of feet with assistance (PT). Flowsheets (Taken 04/05/2020 0742) LTG: Pt will ambulate in controlled environ  assist needed:: (upgraded due to improved balance, strength, and endurance) Contact Guard/Touching assist LTG: Ambulation distance in controlled environment: 54ft with LRAD Note: upgraded due to improved balance, strength, and endurance Goal: LTG Patient will ambulate in home environment (PT) Description: LTG: Patient will ambulate in home environment, # of feet with assistance (PT). Flowsheets (Taken 04/05/2020 0742) LTG: Pt will ambulate in home environ  assist needed:: (upgraded due to improved balance, strength, and endurance) Contact Guard/Touching assist LTG: Ambulation distance in home environment: 25ft with LRAD Note: upgraded due to improved balance, strength, and endurance

## 2020-04-05 NOTE — Progress Notes (Signed)
Occupational Therapy Session Note  Patient Details  Name: Bryan [name] MRN: [id number] Date of Birth: 02/01/1976  Today's Date: 04/05/2020 OT Individual Time: 1000-1058 OT Individual Time Calculation (min): 58 min    Short Term Goals: Week 3:  OT Short Term Goal 1 (Week 3): STG = LTGs due to remaining LOS  Skilled Therapeutic Interventions/Progress Updates:    Treatment session with focus on hands on education with pt's mother and brother.  Pt received supine in bed agreeable to therapy session.  [name] and brother arrived shortly after.  Pt completed bed mobility with increased time and supervision/encouragement.  Pt completed stand pivot transfer bed > w/c with initial min assist for sit > stand then CGA for stand pivot to w/c with mod cues for sequencing to locate w/c.  Pt able to recall need to feel chair touch his legs prior to sitting.  Transported pt to ADL apt with family members to complete transfers and simulated self-care tasks.  Pt completed tub/shower transfers with tub bench with initial observation by mother, then mother able to provide CGA and cues for sequencing as pt initially overshooting chair.  Discussed tub bench (ordered by [facility]) and recommendation for grab bar installation vs suction cup grab bar.  Pt completed toilet transfers with both mother and brother with focus on maintaining close supervision to CGA with RW and encouraging them to provide cues for sequencing to increase safety.  Discussed sequencing with bathing and dressing and the need for moderate cues for sequencing and problem solving.  Pt returned to room and remained upright in w/c with seat belt alarm on and all needs in reach.   Therapy Documentation Precautions:  Precautions Precautions: Fall Required Braces or Orthoses: Other Brace Other Brace: L wrist cock up splint to be worn during day per RN - per [facility] care chart (did not have during CIR eval) Restrictions Weight Bearing Restrictions:  No Pain: Pain Assessment Pain Scale: 0-10 Pain Score: 0-No pain   Therapy/Group: Individual Therapy  Rosalio Loud 04/05/2020, 12:27 PM

## 2020-04-05 NOTE — Progress Notes (Signed)
Physical Therapy Session Note  Patient Details  Name: Bryan [name] MRN: [id number] Date of Birth: 02/24/1976  Today's Date: 04/05/2020 PT Individual Time: 1100-1153 PT Individual Time Calculation (min): 53 min   Short Term Goals: Week 2:  PT Short Term Goal 1 (Week 2): pt will perform all bed mobility with min A overall PT Short Term Goal 1 - Progress (Week 2): Met PT Short Term Goal 2 (Week 2): pt will transfer sit<>stand with LRAD and min A PT Short Term Goal 2 - Progress (Week 2): Progressing toward goal PT Short Term Goal 3 (Week 2): pt will ambulate 11f with LRAD and mod A of 1 PT Short Term Goal 3 - Progress (Week 2): Met Week 3:  PT Short Term Goal 1 (Week 3): STG=LTG due to LOS  Skilled Therapeutic Interventions/Progress Updates:   [facility] for family education training. Session with emphasis on discharge planning, functional mobility/transfers, generalized strengthening, dynamic standing balance/coordination, ambulation, stair navigation, simulated car transfers, and improved activity tolerance. Pt [facility] for time management purposes and pt performed simulated car transfer x 1 with therapist and x 1 with mother/brother and CGA. Pt required cues for turning technique [facility] with tendency to walk past chair. Educated mother and bother on hand placement, body positioning, and verbal cues to provide with transfers (specifially when turning) throughout session. [name] verbalized and demonstrated good understanding of appropriate assist pt requires. [facility] and ambulated [id number] 3 trials with RW and CGA. Trial 1 with therapist and trials 2 and 3 with mother/brother. [name] reports 1 STE with 2 handrails but only able to reach one. Pt navigated 4 steps with 1 handrail and min A with therapist ascending with a step through and descending with a step  to pattern with verbal cues for sequencing to demonstrate to family how pt is doing practicing stairs. Therapist then demonstrated technique to navigate 6in curb with RW and pt navigated 1 6in curb with RW and min A provided by pt's mother. [name] able to provide appropriate verbal and tactile cues for safety. Therapist emphasized importance of stepping to front of RW prior to stepping up/down for safety. [facility] and pt agreeable to stay [facility]. Concluded session with pt sitting in WC, needs within reach, and seatbelt alarm. Therapist provided fresh drink for pt.   Therapy Documentation Precautions:  Precautions Precautions: Fall Required Braces or Orthoses: Other Brace Other Brace: L wrist cock up splint to be worn during day per RN - per [facility] care chart (did not have during CIR eval) Restrictions Weight Bearing Restrictions: No  Therapy/Group: Individual Therapy AnAlfonse AlpersT, DPT   04/05/2020, 7:28 AM

## 2020-04-05 NOTE — Progress Notes (Signed)
Patient's mom and son educated on insulin administrations diabetes diets and how to live with diabetes. All questions and concerns addressed. Family members demonstrated how to administer insulin and expressed understanding. We continue to monitor.

## 2020-04-05 NOTE — Consult Note (Signed)
Neuropsychological Consultation   Patient:   Bryan [name]   DOB:   [birth year]  MR Number:  [id number]  Location:  [facility] [address] CENTER A [id number] [place] [address] Dept: [phone] Loc: [phone]           Date of Service:   04/05/2020  Start Time:   2 PM End Time:   3 PM  This visit was divided into 2 sessions.  The first attempt was short duration with the patient in a semiprivate room and other patient receiving therapy and extensive distractions including loud TV and other activities going on in the room.  We talked briefly that I return later around 315 to have more in-depth conversation/clinical assessment.  Provider/Observer:  Arley Phenix, Psy.D.       Clinical Neuropsychologist       Billing Code/Service: [phone]  Chief Complaint:    Bryan [name] is a 45 year old male who has a past history of hypertension, paranoid schizophrenia with several previous inpatient involuntary commitments.  Patient mended on 01/25/2020 with hypotension, lethargy and found to have newly diagnosed diabetes with DKA and AKA.  Patient was treated and started on broad-spectrum antibiotics due to concerns of sepsis.  On 11/30 patient found apneic and pulseless requiring 2 rounds of CPR with ACLS protocol.  Patient was intubated and work-up also identified acute pancreatitis.  MRI done due to decrease in responsiveness was negative for acute changes.  Patient did require trach on 12/18 and post extubation was noted to be minimally responsive with EEG done on 12/17 revealing moderate diffuse encephalopathy and cognitive deficits were felt to be 2 to anoxic brain injury.  Patient self decannulated on 1/6 and tolerated extubation.  Respiratory status has been stable but the patient has had bouts of agitation with improvements in agitated state and some improvements in cognition but patient continues to be limited by  cognitive deficits with diffuse weakness and reduced overall functional status.  Reason for Service:  Patient was referred for neuropsychological consultation due to cognitive deficits as well as a past history of significant paranoid schizophrenia.  Below is the HPI for the current admission.  Bryan [name] is a 45 year old male with history of HTN, schizophrenia who was admitted on 01/25/20 with hypotension, lethargy and found to have newly diagnosed DM with DKA with AKI. He was treated with IVF, IV insulin and started on broad spectrum antibiotics due toleucocytosis withconcerns of sepsis.On 11/30, patient found apneic and pulseless requiring two rounds of CPR with ACLS protocol with ROSC.He was intubated and work up done revealing moderate acute pancreatitis--likely due to elevated triglycerides-600. Abdominal ultrasound showed GB sludge without acute cholecystitis and fatty liver. 2D echo showed EF 70-75% with grade I DD.   He was treated with supportive care and MRI brain done due to decrease in responsiveness-->negative for acute changes. He required tracheostomy 12/18 by Dr. Suszanne Conners for VDRF. 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 He tolerated extubation and self decannulated on 01/06. Respiratory status has been stable and diet has been advanced to regular. Bouts of agitation has resolved and mentation improving but patient continued to be limited by cognitive deficits with diffuse weakness affecting overall functional status. Therapy working on pregait activity/standing attempts. Family has declined SNF and plans on providing care post discharge. CIR was recommended due to functional decline.  Current Status:  Upon entering the room, the patient was asleep wrapped up in blankets.  He was difficult to initially arouse and describe being very lethargic.  Patient with  very slowed verbal response and clear ongoing cognitive deficits and lethargy.  Patient was unable to give much of a description about his current status.  The patient did deny any current auditory or visual hallucinations.  Patient remained extremely lethargic and offered only minimal responses and only responded were communicated to direct questions primarily with yes or no answers.  Patient's affect was very flat.  Receptive and expressive language appeared to be generally intact and he did appear to understand questions asked of him.  Behavioral Observation: Bryan [name]  presents as a 45 y.o.-year-old Right African American Male who appeared his stated age. his dress was Appropriate and he was Well Groomed and his manners were Appropriate, for this specific condition to the situation.  his participation was indicative of Appropriate, Inattentive and Redirectable behaviors.  There were physical disabilities noted.  he displayed an appropriate level of cooperation and motivation.     Interactions:    Minimal Drowsy and Inattentive  Attention:   abnormal and attention span appeared shorter than expected for age  Memory:   abnormal; global memory impairment noted  Visuo-spatial:  not examined  Speech (Volume):  low  Speech:   normal; slowed response style  Thought Process:  Circumstantial  Though Content:  WNL; not suicidal and not homicidal  Orientation:   person and place  Judgment:   Poor  Planning:   Poor  Affect:    Blunted and Flat  Mood:    Dysphoric  Insight:   Shallow  Intelligence:   low  Medical History:   Past Medical History:  Diagnosis Date  . Hypertension   . Psychiatric problem    Seen at [facility] for unknown reason. Takes seroquel. History of hearing voices.  Not commandivng voices.   . Schizophrenia (HCC)   . Tremors of nervous system          Patient Active Problem List   Diagnosis Date Noted  . Visual disturbance   . Acute blood loss anemia   .  Hyponatremia   . Elevated serum creatinine   . Schizophrenia (HCC)   . Elevated triglycerides with high cholesterol   . Controlled type 2 diabetes mellitus with hyperglycemia (HCC)   . 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  . Pressure injury of skin 02/16/2020  . Toxic metabolic encephalopathy   . AKI (acute kidney injury) (HCC)   . Dehydration   . Pancreatitis   . Respiratory failure (HCC)   . DKA (diabetic ketoacidosis) (HCC) 01/25/2020  . 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 Paranoid schizophrenia (HCC)   . Undifferentiated schizophrenia (HCC)   . 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 Illiterate 10/30/2011  . Schizoaffective disorder, depressive type (HCC) 10/27/2011  . Schizoaffective disorder (HCC) 08/10/2011  . Observed seizure-like activity (HCC) 08/09/2011  . PAIN IN JOINT, MULTIPLE SITES 02/16/2010  . FATIGUE 02/16/2010  . Shortness of breath 02/16/2010     Psychiatric History:  Patient has a history of involuntary hospitalizations and noncompliance with medications and has been treated for schizophrenia both paranoid schizophrenia and undifferentiated schizophrenia as well as major depressive disorder.  Family Med/Psych History: History reviewed. No pertinent family history.   Impression/DX:  Bryan [name] is a 45 year old male who has a past history of hypertension, paranoid schizophrenia with several previous inpatient  involuntary commitments.  Patient mended on 01/25/2020 with hypotension, lethargy and found to have newly diagnosed diabetes with DKA and AKA.  Patient was treated and started on broad-spectrum antibiotics due to concerns of sepsis.  On 11/30 patient found apneic and pulseless requiring 2 rounds of CPR with ACLS protocol.  Patient was intubated and work-up also identified acute pancreatitis.  MRI done due to decrease in responsiveness was negative for acute changes.  Patient did require trach on 12/18 and post  extubation was noted to be minimally responsive with EEG done on 12/17 revealing moderate diffuse encephalopathy and cognitive deficits were felt to be 2 to anoxic brain injury.  Patient self decannulated on 1/6 and tolerated extubation.  Respiratory status has been stable but the patient has had bouts of agitation with improvements in agitated state and some improvements in cognition but patient continues to be limited by cognitive deficits with diffuse weakness and reduced overall functional status.  Upon entering the room, the patient was asleep wrapped up in blankets.  He was difficult to initially arouse and describe being very lethargic.  Patient with very slowed verbal response and clear ongoing cognitive deficits and lethargy.  Patient was unable to give much of a description about his current status.  The patient did deny any current auditory or visual hallucinations.  Patient remained extremely lethargic and offered only minimal responses and only responded were communicated to direct questions primarily with yes or no answers.  Patient's affect was very flat.  Receptive and expressive language appeared to be generally intact and he did appear to understand questions asked of him.  Disposition/Plan:  Will need to continue to monitor the patient for any changes in psychiatric status.  He is taking his proper psychiatric medications and at this point denies any visual hallucinations and does not appear to be paranoid or delusional.  Diagnosis:    Anoxic brain injury along with history of paranoid schizophrenia        Electronically Signed   _______________________ Arley Phenix, Psy.D. Clinical Neuropsychologist

## 2020-04-06 LAB — GLUCOSE, CAPILLARY
Glucose-Capillary: 96 mg/dL (ref 70–99)
Glucose-Capillary: 84 mg/dL (ref 70–99)
Glucose-Capillary: 78 mg/dL (ref 70–99)
Glucose-Capillary: 79 mg/dL (ref 70–99)

## 2020-04-06 MED ORDER — GLUCERNA SHAKE PO LIQD
237.0000 mL | Freq: Two times a day (BID) | ORAL | Status: DC
  Administered 2020-04-06 – 2020-04-08 (×4): 237 mL via ORAL

## 2020-04-06 NOTE — Progress Notes (Signed)
Patient ID: Bryan [name], male   DOB: August 21, 1975, 45 y.o.   MRN: [id number] Mom and brother were here yesterday for education in preparation for discharge Friday 2/11. Mom coming back tomorrow for Speech session since speech therapist had to leave before seeing pt and family. Have ordered DME to call Mom regarding delivery today. Unable to find Home health agency to provide service due to pt's medicaid. Home exercise program given to them. Mom feels prepared for discharge Friday. See Mom tomorrow when here for Speech session for any last minute questions

## 2020-04-06 NOTE — Progress Notes (Signed)
Occupational Therapy Discharge Summary  Patient Details  Name: Bryan [name] MRN: [id number] Date of Birth: 1976-02-18   Patient has met 32 of 13 long term goals due to improved activity tolerance, improved balance, ability to compensate for deficits, functional use of  RIGHT upper and LEFT upper extremity, improved attention, improved awareness and improved coordination.  Patient to discharge at [facility] Assist - CGA level.  Patient's care partner is independent to provide the necessary physical and cognitive assistance at discharge.  Patient's mother and brother attended family education session and completed hands on training with mobility and self-care tasks.  Mother reports understanding of pt requiring increased cues for sequencing and problem solving during self-care tasks and functional mobility.  Reasons goals not met: N/A  Recommendation:  Patient will benefit from ongoing skilled OT services in home health setting to continue to advance functional skills in the area of BADL and Reduce care partner burden.  Equipment: tub bench  Reasons for discharge: treatment goals met and discharge from hospital  Patient/family agrees with progress made and goals achieved: Yes  OT Discharge Precautions/Restrictions  Precautions Precautions: Fall Restrictions Weight Bearing Restrictions: No General   Vital Signs Therapy Vitals Temp: 98.4 F (36.9 C) Temp Source: Oral Pulse Rate: 69 Resp: 18 BP: 121/88 Patient Position (if appropriate): Lying Oxygen Therapy SpO2: 95 % O2 Device: Room Air Pain Pain Assessment Pain Scale: 0-10 Pain Score: 0-No pain ADL ADL Eating: Supervision/safety,Set up Where Assessed-Eating: Wheelchair Grooming: Setup,Supervision/safety Where Assessed-Grooming: Sitting at sink Upper Body Bathing: Supervision/safety Where Assessed-Upper Body Bathing: Shower Lower Body Bathing: Minimal assistance Where Assessed-Lower Body Bathing: Shower Upper  Body Dressing: Minimal assistance Where Assessed-Upper Body Dressing: Wheelchair Lower Body Dressing: Minimal assistance Where Assessed-Lower Body Dressing: Wheelchair Toileting: Minimal assistance Where Assessed-Toileting: Glass blower/designer: Psychiatric nurse Method: Counselling psychologist: Energy manager: Medical sales representative Method: Optometrist: Facilities manager: Environmental education officer Method: Radiographer, therapeutic: Transfer tub bench,Grab bars Vision Baseline Vision/History: No visual deficits Vision Assessment?: Vision impaired- to be further tested in functional context Additional Comments: Pt continues to report inconsistent impaired vision.  Pt with inconsistency and frequent overshooting and undershooting when reaching for items in all visual fields.  Pursuits intact but slow, saccades impaired in all directions, right fields intact, left fields reduced approx 25-50%, VOR slow, depth impaired Perception  Perception: Impaired Praxis Praxis: Impaired Praxis Impairment Details: Ideomotor;Motor planning Cognition Overall Cognitive Status: Impaired/Different from baseline Arousal/Alertness: Awake/alert Orientation Level: Oriented to person;Oriented to place Attention: Sustained Sustained Attention: Impaired Sustained Attention Impairment: Functional basic;Verbal basic Memory: Impaired Memory Impairment: Storage deficit;Decreased recall of new information Awareness: Impaired Problem Solving: Impaired Problem Solving Impairment: Verbal basic;Functional basic Executive Function: Initiating;Sequencing Sequencing: Impaired Sequencing Impairment: Functional basic Initiating: Impaired Initiating Impairment: Functional basic Safety/Judgment: Appears intact Sensation Sensation Light Touch: Appears Intact Proprioception: Impaired  by gross assessment Additional Comments: mild ataxia in BUE and BLE Coordination Gross Motor Movements are Fluid and Coordinated: No Fine Motor Movements are Fluid and Coordinated: No Coordination and Movement Description: grossly uncoordinated UE and LE due to poor motor planning/sequencing, generalized weakness, decreased balance/postural control, and poor activity tolerance Finger Nose Finger Test: dysmetria bilaterally LUE>RUE  Box and blocks:  R = 13, L = 11 9 hole peg:   R = 2 min 29 sec, L = 2 min 52 sec Reaction time with BITS:  In stance, 1 min, circles - R =  5.84 sec, L = 6.43 sec Motor  Motor Motor: Ataxia;Abnormal postural alignment and control Motor - Skilled Clinical Observations: grossly uncoordinated due to poor motor planning/sequencing, LE ataxia, generalized weakness, decreased balance/postural control, and poor activity tolerance Mobility  Bed Mobility Bed Mobility: Rolling Right;Rolling Left;Sit to Supine;Supine to Sit Rolling Right: Supervision/verbal cueing Rolling Left: Supervision/Verbal cueing Supine to Sit: Supervision/Verbal cueing Sit to Supine: Supervision/Verbal cueing Transfers Sit to Stand: Contact Guard/Touching assist Stand to Sit: Supervision/Verbal cueing  Trunk/Postural Assessment  Cervical Assessment Cervical Assessment: Exceptions to [facility] (forward head) Thoracic Assessment Thoracic Assessment: Exceptions to [facility] (mild kyphosis) Lumbar Assessment Lumbar Assessment: Exceptions to [facility] (posterior pelvic tilt) Postural Control Postural Control: Deficits on evaluation  Balance Balance Balance Assessed: Yes Static Sitting Balance Static Sitting - Balance Support: Feet supported;No upper extremity supported Static Sitting - Level of Assistance: 6: Modified independent (Device/Increase time) Dynamic Sitting Balance Dynamic Sitting - Balance Support: Feet supported;No upper extremity supported Dynamic Sitting - Level of Assistance: 6: Modified  independent (Device/Increase time) Static Standing Balance Static Standing - Balance Support: Bilateral upper extremity supported (RW) Static Standing - Level of Assistance: 5: Stand by assistance (supervision) Dynamic Standing Balance Dynamic Standing - Balance Support: Bilateral upper extremity supported (RW) Dynamic Standing - Level of Assistance: 5: Stand by assistance (CGA) Extremity/Trunk Assessment RUE Assessment RUE Assessment: Exceptions to [facility] Active Range of Motion (AROM) Comments: shoulder limited to ~100*, elbow and wrist WNL General Strength Comments: strength grossly 3/5, good gross grasp LUE Assessment LUE Assessment: Exceptions to [facility] Active Range of Motion (AROM) Comments: WNL General Strength Comments: grossly 3/5 overall, loose gross grasp   HOXIE, [facility] 04/06/2020, 8:21 AM

## 2020-04-06 NOTE — Progress Notes (Signed)
Physical Therapy Discharge Summary  Patient Details  Name: Bryan [name] MRN: [id number] Date of Birth: [birth year]  Today's Date: 04/07/2020 PT Individual Time: 0915-1011 PT Individual Time Calculation (min): 56 min   Patient has met 11 of 11 long term goals due to improved activity tolerance, improved balance, improved postural control, increased strength, improved attention, improved awareness and improved coordination. Patient to discharge at an ambulatory level CGA. Patient's care partner is independent to provide the necessary physical assistance at discharge. [name] and brother attended family education training on 2/8 and pt's mother verbalized and demonstrated confidence with all tasks to ensure safe discharge home. [name] has good understanding of body mechanics, hand placement, and verbal/tactile cues necessary to provide pt for safety.   All goals met  Recommendation:  Patient will benefit from ongoing skilled PT services in home health setting to continue to advance safe functional mobility, address ongoing impairments in transfers, generalized strengthening, dynamic standing balance/coordination, NMR, motor planning/coordination, ambulation, endurance, and to minimize fall risk.  Equipment: RW  Reasons for discharge: treatment goals met  Patient/family agrees with progress made and goals achieved: Yes  Today's Interventions: Received pt supine in bed, pt agreeable to therapy, and denied any pain during session. Session with emphasis on discharge planning, functional mobility/transfers, generalized strengthening, dynamic standing balance/coordination, ambulation, simulated car transfers, NMR, motor control, and improved activity tolerance. Pt performed bed mobility with supervision from flat bed and transferred bed<>WC with RW and CGA with cues for sequencing when turning. Pt transported to ortho gym in [facility] total A for time management purposes and ambulated 65f on  uneven surfaces (ramp) with RW and CGA with cues for RW management and safe speed. Pt performed ambulatory simulated car transfer with RW and CGA with mod cues for sequencing and turning technique. Pt able to stand and pick up small cup from floor with RW and CGA. Pt performed the following activities on BITS with emphasis on standing balance, fine motor control, visual scanning, and perceptual awareness: -single target visual scanning for 2 minutes and 28 seconds with 54% accurracy and 24 hits. Pt with improvements in accuracy with fine motor control -visual motor geoboards for 3 minutes. Pt with increased difficulty with spatial awareness and required max cues to recreate single line. -bell cancellation test for 4 minutes. Pt able to locate 4 bells total but due to dysmetria ended up circling 2 non-bell pictures. Pt with increased difficulty scanning and separating bells from other images. Pt performed WC mobility 102fusing BUE and supervision and transported remainder of way back to room total A. Concluded session with pt sitting in WC, needs within reach, and seatbelt alarm on.   PT Discharge Precautions/Restrictions Precautions Precautions: Fall Restrictions Weight Bearing Restrictions: No Cognition Overall Cognitive Status: Impaired/Different from baseline Arousal/Alertness: Awake/alert Orientation Level: Oriented to place;Oriented to person Memory: Impaired Awareness: Impaired Problem Solving: Impaired Sequencing: Impaired Initiating: Impaired Safety/Judgment: Appears intact Sensation Sensation Light Touch: Appears Intact Proprioception: Impaired by gross assessment Additional Comments: mild ataxia in BUE and BLE; improvements since eval Coordination Gross Motor Movements are Fluid and Coordinated: No Fine Motor Movements are Fluid and Coordinated: No Coordination and Movement Description: uncoordinated due to axatia, generalized weakness, decreased balance/postural control,  decreased motor planning/sequencing, and decreased activity tolerance. Finger Nose Finger Test: dysmetria bilaterally LUE>RUE Heel Shin Test: ROM WFL but poor coordination Motor  Motor Motor: Ataxia;Abnormal postural alignment and control Motor - Skilled Clinical Observations: grossly uncoordinated due to poor motor planning/sequencing, LE ataxia, generalized weakness,  decreased balance/postural control, and poor activity tolerance  Mobility Bed Mobility Bed Mobility: Rolling Right;Rolling Left;Sit to Supine;Supine to Sit Rolling Right: Supervision/verbal cueing Rolling Left: Supervision/Verbal cueing Supine to Sit: Supervision/Verbal cueing Sit to Supine: Supervision/Verbal cueing Transfers Transfers: Sit to Stand;Stand to Sit;Stand Pivot Transfers Sit to Stand: Contact Guard/Touching assist Stand to Sit: Supervision/Verbal cueing Stand Pivot Transfers: Contact Guard/Touching assist Transfer (Assistive device): Rolling walker Locomotion  Gait Ambulation: Yes Gait Assistance: Contact Guard/Touching assist Gait Distance (Feet): 186 Feet Assistive device: Rolling walker Gait Gait: Yes Gait Pattern: Impaired Gait Pattern: Decreased step length - right;Decreased step length - left;Decreased stride length;Ataxic;Trunk flexed;Poor foot clearance - right;Poor foot clearance - left;Decreased trunk rotation Gait velocity: decreased Stairs / Additional Locomotion Stairs: Yes Stairs Assistance: Minimal Assistance - Patient > 75% Stair Management Technique: Two rails Number of Stairs: 8 Height of Stairs: 6 Ramp: Contact Guard/touching assist (RW) Curb: Minimal Assistance - Patient >75% (RW) Product manager Mobility: Yes Wheelchair Assistance: Chartered loss adjuster: Both upper extremities Wheelchair Parts Management: Needs assistance Distance: 121f  Trunk/Postural Assessment  Cervical Assessment Cervical Assessment: Exceptions to [facility] (forward head) Thoracic Assessment Thoracic Assessment: Exceptions to [facility](mild kyphosis) Lumbar Assessment Lumbar Assessment: Exceptions to [facility](posterior [facility] tilt) Postural Control Postural Control: Deficits on evaluation  Balance Balance Balance Assessed: Yes Static Sitting Balance Static Sitting - Balance Support: Feet supported;No upper extremity supported Static Sitting - Level of Assistance: 6: Modified independent (Device/Increase time) Dynamic Sitting Balance Dynamic Sitting - Balance Support: Feet supported;No upper extremity supported Dynamic Sitting - Level of Assistance: 6: Modified independent (Device/Increase time) Static Standing Balance Static Standing - Balance Support: Bilateral upper extremity supported (RW) Static Standing - Level of Assistance: 5: Stand by assistance (supervision) Dynamic Standing Balance Dynamic Standing - Balance Support: Bilateral upper extremity supported (RW) Dynamic Standing - Level of Assistance: 5: Stand by assistance (CGA) Extremity Assessment  RLE Assessment RLE Assessment: Exceptions to [facility] St[facility] Comments: grossly generalized to 4/5 LLE Assessment LLE Assessment: Exceptions to [facility] Strength Comments: grossly generalized to 4/5  AAlfonse AlpersPT, DPT  04/06/2020, 12:17 PM

## 2020-04-06 NOTE — Progress Notes (Signed)
Nutrition Follow-up  DOCUMENTATION CODES:   Not applicable  INTERVENTION:   - Glucerna Shake po BID, each supplement provides 220 kcal and 10 grams of protein  - d/c ProSource Plus  - Continue MVI with minerals daily  NUTRITION DIAGNOSIS:   Increased nutrient needs related to wound healing,other (therapies) as evidenced by estimated needs.  Ongoing  GOAL:   Patient will meet greater than or equal to 90% of their needs  Progressing  MONITOR:   PO intake,Supplement acceptance,Labs,Weight trends,Skin,I & O's  REASON FOR ASSESSMENT:   Consult Diet education  ASSESSMENT:   45 year old male with PMH of HTN and schizophrenia who was admitted on 01/25/20 with newly diagnosed DM with DKA with AKI. On 11/30, patient found apneic and pulseless requiring two rounds of CPR with ACLS protocol with ROSC. Pt was intubated and work-up done revealing moderate acute pancreatitis. Pt required tracheostomy 12/18. Pt tolerated extubation and self decannulated on 03/03/20. Admitted to CIR on 1/18.  Spoke with pt at bedside. Pt self-feeding lunch at time of RD visit. Pt having some difficulty getting food on the fork but was managing well.  Pt states that he is eating well and likes the food. Pt reports eating "all" of his breakfast and plans to eat all of his lunch.  Pt states that he does not like the ProSource Plus supplements. He is willing to try Glucerna. RD to order.  No new weights since 03/29/20.  Meal Completion: 80-100% x last 8 meals  Medications reviewed and include: ProSource daily, colace, SSI, novolog 3 units TID with meals, levemir 25 units daily, metformin, MVI with minerals daily, protonix  Labs reviewed. CBG's: 78-113 x 24 hours  UOP: 875 ml x 24 hours  Diet Order:   Diet Order            Diet Carb Modified Fluid consistency: Thin; Room service appropriate? Yes  Diet effective now                 EDUCATION NEEDS:   Not appropriate for education at this  time  Skin:  Skin Assessment: Skin Integrity Issues: DTI: left heel Incisions: neck  Last BM:  04/05/20  Height:   Ht Readings from Last 1 Encounters:  03/15/20 5\' 10"  (1.778 m)    Weight:   Wt Readings from Last 1 Encounters:  03/29/20 91.2 kg    BMI:  Body mass index is 28.85 kg/m.  Estimated Nutritional Needs:   Kcal:  2200-2400  Protein:  130-150 grams  Fluid:  2.2-2.4 L    05/27/20, MS, RD, LDN Inpatient Clinical Dietitian Please see [facility] for contact information.

## 2020-04-06 NOTE — Patient Care Conference (Signed)
Inpatient RehabilitationTeam Conference and Plan of Care Update Date: 04/06/2020   Time: 11:43 AM    Patient Name: Bryan [name]      Medical Record Number: [id number]  Date of Birth: 1975/05/08 Sex: Male         Room/Bed: 4W12C/4W12C-01 Payor Info: Payor: MEDICAID [place] / Plan: MEDICAID [place] ACCESS / Product Type: *No Product type* /    Admit Date/Time:  03/15/2020  5:13 PM  Primary Diagnosis:  Anoxic brain injury [facility])  Hospital Problems: Principal Problem:   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HCC) Active Problems:   Schizophrenia (HCC)   Elevated triglycerides with high cholesterol   Controlled type 2 diabetes mellitus with hyperglycemia (HCC)   Elevated serum creatinine   Hyponatremia   Acute blood loss anemia   Visual disturbance    Expected Discharge Date: Expected Discharge Date: 04/08/20  Team Members Present: Physician leading conference: Dr. Maryla Morrow Care Coodinator Present: Chana Bode, RN, BSN, CRRN;Becky Dupree, LCSW Nurse Present: Despina Hidden, RN PT Present: Raechel Chute, PT OT Present: Rosalio Loud, OT SLP Present: Philis Pique, SLP PPS Coordinator present : Fae Pippin, SLP     Current Status/Progress Goal Weekly Team Focus  Bowel/Bladder   occasional incontience getting patient up Q6-8 H for toileting LBM 0208  pt will be continent of B/B with normal bowel pattern  continue to get pt up for toileting Q6-8H   Swallow/Nutrition/ Hydration             ADL's   Supervision bed mobility, Min assist sit > stand and transfers with RW, Min assist bathing and dressing at shower level.  Continues to require increased cues for sequencing with bathing, dressing due to impared vision and cognitive impairments  Min A overall, Setup eating and grooming  ADL retraining, transfers, dynamic standing balance, functional use of BUE, activity tolerance, sequencing, problem solving, family education, d/c planning   Mobility   bed mobility supervision, transfers with RW min A,  gait 18ft with RW CGA/min A, 4 steps 2 rails min A, WC mobility 16ft supervision  CGA transfers, min A gait and car transfer  functional mobility/transfers, generalized strengthening, dynamic standing balance/coordination, ambulation, NMR, motor control, and endurance   Communication   Supervision  Supervision A  Family education   Safety/Cognition/ Behavioral Observations  Mod A  Min A  Family Education   Pain   denies pain refusing scheduled tylenol  pt will be free of pain  assess pain qshift and prn   Skin   healed ear abrasion healed Stage II to left heel, prevalon boot and elevation, healed trach site  pt will have resolution of current issues without further breakdown or infection  assess skin qshift and prn     Discharge Planning:  Family education yesterday with Mom and his brother. Mom coming back for Speech session since did not occur yesterday. Preparing for discharge Friday. Aware will require 24/7 care   Team Discussion: Medically stable Patient on target to meet rehab goals: yes, CGA goals with min assist for car transfers for discharge.  *See Care Plan and progress notes for long and short-term goals.   Revisions to Treatment Plan:  Recommended HH follow up services for PT Teaching Needs: Family education completed 04/05/20 with mother and brother. Requires cues for sequencing with self care/safety.  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  Barriers to Discharge: Medical stability;New diabetic;Home enviroment access/layout;Decreased family/caregiver support;Behavior;Incontinence   Possible Resolutions to [employer] Focus: Therapies, optimize DM meds, bowel meds, follow labs - Na, patient and family  edu   Continued Need for Acute Rehabilitation Level of Care: The patient requires daily medical management by a physician with specialized training in physical medicine and rehabilitation for the following reasons: Direction of a multidisciplinary physical rehabilitation program to maximize functional independence : Yes Medical management of patient stability for increased activity during participation in an intensive rehabilitation regime.: Yes Analysis of laboratory values and/or radiology reports with any subsequent need for medication adjustment and/or medical intervention. : Yes   I attest that I was present, lead the team conference, and concur with the assessment and plan of the team.   Pamelia Hoit 04/06/2020, 2:28 PM

## 2020-04-06 NOTE — Progress Notes (Signed)
Physical Therapy Session Note  Patient Details  Name: Bryan [name] MRN: [id number] Date of Birth: Oct 02, 1975  Today's Date: 04/06/2020 PT Individual Time: [phone] PT Individual Time Calculation (min): 56 min   Short Term Goals: Week 2:  PT Short Term Goal 1 (Week 2): pt will perform all bed mobility with min A overall PT Short Term Goal 1 - Progress (Week 2): Met PT Short Term Goal 2 (Week 2): pt will transfer sit<>stand with LRAD and min A PT Short Term Goal 2 - Progress (Week 2): Progressing toward goal PT Short Term Goal 3 (Week 2): pt will ambulate 5f with LRAD and mod A of 1 PT Short Term Goal 3 - Progress (Week 2): Met Week 3:  PT Short Term Goal 1 (Week 3): STG=LTG due to LOS  Skilled Therapeutic Interventions/Progress Updates:   Received pt supine in bed asleep, pt easily woken and agreeable to therapy, and denied any pain during session. Session with emphasis on functional mobility/transfers, generalized strengthening, dynamic standing balance/coordination, stair navigation, ambulation, NMR, motor control/planning, and improved activity tolerance. Pt transferred supine<>sitting EOB from flat bed with supervision and required 2 attempts and min A to stand from low bed. Stand<>pivot bed<>WC with RW and CGA. Pt performed WC mobility 1517fusing BUE and supervision to therapy gym and navigated 8 steps with 2 rails and min A ascending with a step through and descending with a step to pattern with cues for foot placement on step due to mild LE ataxia. Pt ambulated 18676fith RW and CGA for balance. Pt required cues for upright gaze. Pt demonstrates decreased cadence, decreased bilateral foot clearance, and with tendency to kick L side of RW with foot. Pt reported feeling "a little exhausted" after ambulating. Worked on dynamic standing balance tossing horseshoes without AD and min A for balance x 3 trials with min cues for visual scanning to locate target on floor. Worked on dynamic  standing balance, fine motor control, spatial awareness/proprioception, and visual scanning clipping clothespins to clothesline using RUE with min cues x 2 trials with CGA for balance. Pt reported having "blurry vision" but was ultimately able to locate clothesline. Stand<>pivot mat<>WC with RW and CGA with cues for reaching back for WC prior to sitting and backing up to feel legs on chair. [facility] Concluded session with pt sitting in WC, needs within reach, and seatbelt alarm on.   Therapy Documentation Precautions:  Precautions Precautions: Fall Required Braces or Orthoses: Other Brace Other Brace: L wrist cock up splint to be worn during day per RN - per [facility] care chart (did not have during CIR eval) Restrictions Weight Bearing Restrictions: No  Therapy/Group: Individual Therapy AnnAlfonse Alpers, DPT   04/06/2020, 7:19 AM

## 2020-04-06 NOTE — Progress Notes (Signed)
Physical Therapy Session Note  Patient Details  Name: Bryan [name] MRN: [id number] Date of Birth: 1975-12-26  Today's Date: 04/06/2020 PT Individual Time: 1130-1202 PT Individual Time Calculation (min): 32 min   Short Term Goals: Week 2:  PT Short Term Goal 1 (Week 2): pt will perform all bed mobility with min A overall PT Short Term Goal 1 - Progress (Week 2): Met PT Short Term Goal 2 (Week 2): pt will transfer sit<>stand with LRAD and min A PT Short Term Goal 2 - Progress (Week 2): Progressing toward goal PT Short Term Goal 3 (Week 2): pt will ambulate 20f with LRAD and mod A of 1 PT Short Term Goal 3 - Progress (Week 2): Met Week 3:  PT Short Term Goal 1 (Week 3): STG=LTG due to LOS  Skilled Therapeutic Interventions/Progress Updates:    Patient seated upright in w/c upon PT arrival. Patient alert and agreeable to PT session. Patient denied pain throughout session.  Therapeutic Activity: Transfers: Patient performed STS and SPVT transfers throughout session to/ from various surfaces and heights with RW and close SBA.  Provided verbal cues for recognizing positioning/ distance to seat and no vc required for safe hand progression.   Gait Training/ NMR:  Patient ambulated > 250 feet using RW and CGA with one standing rest break. Some improvement noted in one week since last working with pt with improved step height and midline positioning in walker. Pt also guided in ambulation through 6 cones placed with 5 foot width requiring large turn radius to navigate. Requires vc and visual cues throughout for directionality but is able to complete with good quality of turn.   Pt also guided in NMR with dynamic reaching during ambulation in gym for collection of colored bean bags. VC required for location of bean bags with pt able to locate and maneuver slowly to each bag and maintain balance with reach across midline, outside of BOS and to heights between knee and shoulder heights with no LOB  noted.   Patient seated upright in w/c at end of session with brakes locked, belt alarm set, and all needs within reach.    Therapy Documentation Precautions:  Precautions Precautions: Fall Required Braces or Orthoses: Other Brace Other Brace: L wrist cock up splint to be worn during day per RN - per [facility] care chart (did not have during CIR eval) Restrictions Weight Bearing Restrictions: No  Therapy/Group: Individual Therapy  JAlger Simons2/10/2020, 3:32 PM

## 2020-04-06 NOTE — Progress Notes (Signed)
Occupational Therapy Session Note  Patient Details  Name: Bryan [name] MRN: [id number] Date of Birth: 10-Jun-1975  Today's Date: 04/06/2020 OT Individual Time: 1306-1401 OT Individual Time Calculation (min): 55 min    Short Term Goals: Week 3:  OT Short Term Goal 1 (Week 3): STG = LTGs due to remaining LOS  Skilled Therapeutic Interventions/Progress Updates:    Treatment session with focus on self-care retraining with bathing, dressing, and bathroom transfers.  Pt received upright in w/c agreeable to shower this session.  Pt demonstrating increased sequencing and initiation during transfers and bathing, frequently anticipating next step and completing transfers with fewer cues for sequencing.  Pt completed all transfers with CGA with RW and even completing sit > stand and dynamic standing balance with CGA to close supervision for LB bathing and dressing.  Pt demonstrating increased carryover of previous education and prior sessions, requiring min cues overall this session.  Pt reports fatigue and requests to return to bed after shower.  Completed stand pivot transfer CGA with RW and returned to supine with supervision.  Pt left supine with all needs in reach.  Therapy Documentation Precautions:  Precautions Precautions: Fall Required Braces or Orthoses: Other Brace Other Brace: L wrist cock up splint to be worn during day per RN - per [facility] care chart (did not have during CIR eval) Restrictions Weight Bearing Restrictions: No General:   Vital Signs: Therapy Vitals Temp: 98.1 F (36.7 C) Temp Source: Oral Pulse Rate: 70 Resp: 18 BP: 102/70 Patient Position (if appropriate): Lying Oxygen Therapy SpO2: 96 % O2 Device: Room Air Pain:  Pt with no c/o pain ADL: ADL Eating: Supervision/safety,Set up Where Assessed-Eating: Wheelchair Grooming: Setup,Supervision/safety Where Assessed-Grooming: Sitting at sink Upper Body Bathing: Supervision/safety Where Assessed-Upper Body  Bathing: Shower Lower Body Bathing: Minimal assistance Where Assessed-Lower Body Bathing: Shower Upper Body Dressing: Minimal assistance Where Assessed-Upper Body Dressing: Wheelchair Lower Body Dressing: Minimal assistance Where Assessed-Lower Body Dressing: Wheelchair Toileting: Minimal assistance Where Assessed-Toileting: Teacher, adult education: Curator Method: Proofreader: Chiropractor Transfer: International aid/development worker Method: Ship broker: Insurance underwriter: Insurance underwriter Method: Warden/ranger: Transfer tub bench,Grab bars   Therapy/Group: Individual Therapy  Rosalio Loud 04/06/2020, 3:11 PM

## 2020-04-06 NOTE — Progress Notes (Signed)
Per RN --novolog TID not manageable due to mother's schedule. BS have been tightly controlled. --will d/c meal coverage and monitor for 24 hours to see if levemir at current dose effective.

## 2020-04-06 NOTE — Progress Notes (Signed)
[place] PHYSICAL MEDICINE & REHABILITATION PROGRESS NOTE  Subjective/Complaints: Patient seen laying in bed this AM.  He states he slept well overnight.  No reported issues overnight.  ROS: limited due to cognition, but appears to deny CP, shortness of breath, nausea, vomiting, diarrhea.  Objective: Vital Signs: Blood pressure 121/88, pulse 69, temperature 98.4 F (36.9 C), temperature source Oral, resp. rate 18, height 5\' 10"  (1.778 m), weight 91.2 kg, SpO2 95 %. No results found. Recent Labs    04/04/20 0635  WBC 4.8  HGB 12.7*  HCT 39.7  PLT 279   Recent Labs    04/04/20 0635  NA 135  K 4.1  CL 103  CO2 23  GLUCOSE 107*  BUN 10  CREATININE 0.89  CALCIUM 10.0    Intake/Output Summary (Last 24 hours) at 04/06/2020 1003 Last data filed at 04/06/2020 0945 Gross per 24 hour  Intake 592 ml  Output 575 ml  Net 17 ml        Physical Exam: BP 121/88   Pulse 69   Temp 98.4 F (36.9 C) (Oral)   Resp 18   Ht 5\' 10"  (1.778 m)   Wt 91.2 kg   SpO2 95%   BMI 28.85 kg/m  Constitutional: No distress . Vital signs reviewed. HENT: Normocephalic.  Atraumatic. Eyes: EOMI. No discharge. Cardiovascular: No JVD.  RRR. Respiratory: Normal effort.  No stridor.  Bilateral clear to auscultation. GI: Non-distended.  BS +. Skin: Warm and dry.  Intact. Psych: Flat. Slowed.  Musc: No edema in extremities.  No tenderness in extremities. Neuro: Alert and oriented to person and place only, states it is 2023 today Motor: 4-/5 throughout with apraxia, unchanged Poor attention/concentration, slow improvement  Assessment/Plan: 1. Functional deficits which require 3+ hours per day of interdisciplinary therapy in a comprehensive inpatient rehab setting.  Physiatrist is providing close team supervision and 24 hour management of active medical problems listed below.  Physiatrist and rehab team continue to assess barriers to discharge/monitor patient progress toward functional and  medical goals   Care Tool:  Bathing    Body parts bathed by patient: Chest,Face,Right upper leg,Left upper leg,Right arm,Left arm,Abdomen,Front perineal area,Left lower leg,Right lower leg   Body parts bathed by helper: Buttocks     Bathing assist Assist Level: Minimal Assistance - Patient > 75%     Upper Body Dressing/Undressing Upper body dressing   What is the patient wearing?: Pull over shirt    Upper body assist Assist Level: Minimal Assistance - Patient > 75%    Lower Body Dressing/Undressing Lower body dressing      What is the patient wearing?: Underwear/pull up,Pants     Lower body assist Assist for lower body dressing: Minimal Assistance - Patient > 75%     Toileting Toileting    Toileting assist Assist for toileting: Dependent - Patient 0%     Transfers Chair/bed transfer  Transfers assist     Chair/bed transfer assist level: Contact Guard/Touching assist     Locomotion Ambulation   Ambulation assist   Ambulation activity did not occur: Safety/medical concerns (fatigue, poor motor control/planning, weakness)  Assist level: Contact Guard/Touching assist Assistive device: Walker-rolling Max distance: 112ft   Walk 10 feet activity   Assist  Walk 10 feet activity did not occur: Safety/medical concerns (fatigue, poor motor control/planning, weakness)  Assist level: Contact Guard/Touching assist Assistive device: Walker-rolling   Walk 50 feet activity   Assist Walk 50 feet with 2 turns activity did not occur: Safety/medical  concerns (fatigue, poor motor control/planning, weakness)  Assist level: Contact Guard/Touching assist Assistive device: Walker-rolling    Walk 150 feet activity   Assist Walk 150 feet activity did not occur: Safety/medical concerns (fatigue, poor motor control/planning, weakness)  Assist level: Contact Guard/Touching assist Assistive device: Walker-rolling    Walk 10 feet on uneven surface   activity   Assist Walk 10 feet on uneven surfaces activity did not occur: Safety/medical concerns (fatigue, poor motor control/planning, weakness)         Wheelchair     Assist Will patient use wheelchair at discharge?: Yes Type of Wheelchair: Manual    Wheelchair assist level: Supervision/Verbal cueing Max wheelchair distance: 121ft    Wheelchair 50 feet with 2 turns activity    Assist        Assist Level: Supervision/Verbal cueing   Wheelchair 150 feet activity     Assist      Assist Level: Supervision/Verbal cueing    Medical Problem List and Plan: 1.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 Please see conference note from today as well.  2. Antithrombotics: -DVT/anticoagulation:Pharmaceutical:Lovenox -antiplatelet therapy: N/A 3. Pain Management:  Reported with activity per therapies  Tylenol scheduled on 1/26  Appears relatively controlled on 2/9 4. Mood:LCSW to follow for evaluation and support. -antipsychotic agents: See #11. 5. Neuropsych: This patientis notcapable of making decisions onhisown behalf. 6. Skin/Wound Care:Routine pressure relief measures. 7. Fluids/Electrolytes/Nutrition:Monitor I/Os.  8. T2DM with hyperglycemia: New diagnosis with A1C-7.1 on 1/19.  Carb modified diet.   Levemir decreased to 25 units  NovoLog decreased to 3 units  Continue Metformin and meal coverage.   Use SSI for elevated BS   CBG (last 3)  Recent Labs    04/05/20 1621 04/05/20 2152 04/06/20 0549  GLUCAP 78 113* 96   Relatively controlled on 2/9 9.Acute blood loss anemia:Resolved  Hemoglobin 13.0 on 1/19 10. Elevated Triglycerides: 294 on 1/19 11. Psychosis d/o Schizophrenia.  Last documented admission 11/20 (history of non-compliance)  Currently off Abiligy, Cogentin and Risperdal during hospitalization.   Continue Seroquel 100 nightly  Decreased Seroquel to 50 daily on 1/20 to improve daytime alertness, monitor Sleep wake chart.  Appreciate psych recs-started on fluoxetine  Stable on 2/9 12.  Elevated Creatinine: Resolved  Continue to monitor 13. Hyponatremia  Na 135 on 2/8  Continue to monitor 14.  Acute blood loss anemia  Hemoglobin 12.7 on 2/7 15. Visual deficits-appears to be premorbid  Likely secondary to attention +/- diabetic  Stable  LOS: 22 days A FACE TO FACE EVALUATION WAS PERFORMED  Bryan [name] Karis Juba 04/06/2020, 10:03 AM

## 2020-04-06 NOTE — Progress Notes (Signed)
Speech Language Pathology Daily Session Note  Patient Details  Name: Bryan [name] MRN: [id number] Date of Birth: [birth year]  Today's Date: 04/06/2020 SLP Individual Time: 0730-0810 SLP Individual Time Calculation (min): 40 min  Short Term Goals: Week 3: SLP Short Term Goal 1 (Week 3): STG=LTG due to short ELOS  Skilled Therapeutic Interventions: Skilled treatment session focused on cognitive goals. SLP facilitated session by providing set-up assist with breakfast meal of regular textures with thin liquids. SLP facilitated session by providing extra time and overall Min A verbal cues for functional problem solving during self-feeding. Patient also demonstrated difficulty locating specific food items on his plate which required overall Min verbal and visual cues. Patient consumed meal without overt s/s of aspiration. Patient demonstrated sustained attention to functional task for ~30 minutes with Mod I. Patient left upright in bed with alarm on and all needs within reach. Continue with current plan of care.      Pain No/Denies Pain   Therapy/Group: Individual Therapy  [name] 04/06/2020, 3:53 PM

## 2020-04-07 LAB — GLUCOSE, CAPILLARY
Glucose-Capillary: 85 mg/dL (ref 70–99)
Glucose-Capillary: 106 mg/dL — ABNORMAL HIGH (ref 70–99)
Glucose-Capillary: 92 mg/dL (ref 70–99)
Glucose-Capillary: 114 mg/dL — ABNORMAL HIGH (ref 70–99)

## 2020-04-07 NOTE — Progress Notes (Addendum)
Inpatient Rehabilitation Care Coordinator Discharge Note  The overall goal for the admission was met for:   Discharge location: Yes-HOME WITH MOM AND TWO BROTHER'S-24 HR CARE  Length of Stay: Yes-24 DAYS  Discharge activity level: Yes-MIN-CGA LEVEL  Home/community participation: Yes  Services provided included: MD, RD, PT, OT, SLP, RN, CM, Pharmacy, Neuropsych and SW  Financial Services: Medicaid  Choices offered to/list presented to:YES  Follow-up services arranged: DME: [facility], TUB BENCH AND ROLLING WALKER and Patient/Family has no preference for HH/DME agencies COULD NOT [place]. HOME EXERCISE PROGRAM GIVEN TO FAMILY  Comments (or additional information):MOM AND BROTHER WERE ON FOR FAMILY EDUCATION AND COMFORTABLE WITH PT'S CARE NEEDS. AWARE WILL NEED 24/7 CARE. WILL FOLLOW UP WITH [facility] FOR [facility] MEDICATION MANAGEMENT  Patient/Family verbalized understanding of follow-up arrangements: Yes  Individual responsible for coordination of the follow-up plan: [name]-MOM [phone]  Confirmed correct DME delivered: Elease Hashimoto 04/07/2020    Elease Hashimoto

## 2020-04-07 NOTE — Progress Notes (Signed)
Speech Language Pathology Discharge Summary  Patient Details  Name: Bryan [name] MRN: [id number] Date of Birth: [birth year]  Today's Date: 04/07/2020 SLP Individual Time: 1300-1400 SLP Individual Time Calculation (min): 60 min   Skilled Therapeutic Interventions:  Patient seen with mother present for skilled ST session focusing on family education. SLP discussed patient's overall progress, expected needs and level of assistance/supervision upon discharge, as well as suggestions for continuing to work with patient's cognitive function at home and the importance of keeping patient physically and cognitively active as he continues to require cues to initiate and would likely not frequently get out of bed, etc consistently without cues and assistance.     Patient has met 4 of 4 long term goals.  Patient to discharge at [facility] level.  Reasons goals not met:   N/A  Clinical Impression/Discharge Summary: Patient has made good progress overall and was able to meet his LTG's. He is currently at a minA level overall for basic level communication and cognitive functioning. He continues with difficulty in time orientation and short term memory, continues with slow cognitive processing and decreased initiation of both communication and problem solving tasks. He has demosntrated improved sustained attention, improved active participation and continuation of task with SLP able to decrease from hand over hand to minA verbal and tactile cues. Visual deficits which patient describes as "blurry" especially in periphery seem to have improved somewhat but continue to impact his overall ability to perform basic ADL's. Patient's Mom has completed education for all disciplines and will be primary caregiver for patient upon discharge from hospital. Patient is expected to continue to improve with his cognitive-linguistic abilities with SLP intervention at next venue of care [facility]).  Care Partner:  Caregiver Able to  Provide Assistance: Yes  Type of Caregiver Assistance: Physical;Cognitive  Recommendation:  Home Health SLP;24 hour supervision/assistance  Rationale for SLP Follow Up: Reduce caregiver burden   Equipment: None for ST   Reasons for discharge: Discharged from hospital   Patient/Family Agrees with Progress Made and Goals Achieved: Yes    Sonia Baller, MA, CCC-SLP Speech Therapy

## 2020-04-07 NOTE — Progress Notes (Signed)
Occupational Therapy Session Note  Patient Details  Name: Bryan [name] MRN: [id number] Date of Birth: 08/23/1975  Today's Date: 04/07/2020 OT Individual Time: 1100-1200 OT Individual Time Calculation (min): 60 min    Short Term Goals: Week 2:  OT Short Term Goal 1 (Week 2): Pt will complete toilet transfers with mod assist of one caregiver 3 out of 5 times to demonstrate increased carryover OT Short Term Goal 1 - Progress (Week 2): Met OT Short Term Goal 2 (Week 2): Pt will complete LB dressing with mod assist at sit > stand level OT Short Term Goal 2 - Progress (Week 2): Met OT Short Term Goal 3 (Week 2): Pt will complete UB dressing with min assist OT Short Term Goal 3 - Progress (Week 2): Met OT Short Term Goal 4 (Week 2): Pt will complete toileting task with max assist of one caregiver OT Short Term Goal 4 - Progress (Week 2): Met Week 3:  OT Short Term Goal 1 (Week 3): STG = LTGs due to remaining LOS  Skilled Therapeutic Interventions/Progress Updates:    Patient seated in w/c, finishing with PT.  He is pleasant and cooperative t/o session with cues for date.  Sit to stand and SPT/ambulation with RW to/from w/c, toilet, mat table CGA - cues for position in space at times.  toileting completed with CGA, min cues and increased time.  Hand hygiene in stance at sink with CGA and cues.  Completed unsupported sitting with DS and arm coordination, visual perceptual activities/assessments for discharge - see discharge summary for details.  He remained seated in wc at close of session, seat belt alarm set and call bell in hand.    Therapy Documentation Precautions:  Precautions Precautions: Fall Required Braces or Orthoses: Other Brace Other Brace: L wrist cock up splint to be worn during day per RN - per [facility] care chart (did not have during CIR eval) Restrictions Weight Bearing Restrictions: No   Therapy/Group: Individual Therapy  Carlos Levering 04/07/2020, 7:43 AM

## 2020-04-07 NOTE — Progress Notes (Signed)
Physical Therapy Session Note  Patient Details  Name: Bryan [name] MRN: [id number] Date of Birth: 1975-08-23  Today's Date: 04/07/2020 PT Individual Time: 1035-1105 PT Individual Time Calculation (min): 30 min   Short Term Goals: Week 3:  PT Short Term Goal 1 (Week 3): STG=LTG due to LOS  Skilled Therapeutic Interventions/Progress Updates:     Patient in w/c in the room upon PT arrival. Patient alert and agreeable to PT session. Patient denied pain during session.  Therapeutic Activity: Transfers: Patient performed sit to/from stand x2 with close supervision-CGA using RW. Provided verbal cues for reaching back to sit x1 for safety.  Gait Training:  Patient ambulated >120 feet x2 using RW with CGA. Ambulated with significantly slow gait speed, decreased step length and height, forward trunk lean and decreased visual scanning. Provided verbal cues for erect posture, increased gait speed and step height, and cues for identifying objects to increased visual scanning, however, this lead to increased stopping and even slower gait speed due to dual task challlenge. On second trial, had patient count by 2's to 50, reduced gait speed without stopping and no errors in counting. Then had him try to count backwards by 2's from 50, patient unable to figure out the first number with max cues, shifted to counting backwards by 2's from 10. Required max cues for first trial of this, then performed a second time without cues to complete. Patient paused with every number on the first trial, then was able to do the second trial without stopping, but continued to ambulate at a slow pace.   Asked patient to provide an animal for the letters ABCD in sitting at end of session. Patient required max cues and hits to name animals for each letter.   Patient in w/c handed off to [place], OT at end of session with breaks locked.   Therapy Documentation Precautions:  Precautions Precautions: Fall Required Braces or  Orthoses: Other Brace Other Brace: L wrist cock up splint to be worn during day per RN - per [facility] care chart (did not have during CIR eval) Restrictions Weight Bearing Restrictions: No  Therapy/Group: Individual Therapy  Cheri Ayotte L Melessa Cowell PT, DPT  04/07/2020, 6:18 PM

## 2020-04-07 NOTE — Progress Notes (Signed)
[place] PHYSICAL MEDICINE & REHABILITATION PROGRESS NOTE  Subjective/Complaints: Patient seen laying in bed this morning.  He states he slept well overnight.  No reported issues overnight.  He denies complaints.  He is unaware about plans for discharge tomorrow.  ROS: limited due to cognition, but appears to deny CP, shortness of breath, nausea, vomiting, diarrhea.  Objective: Vital Signs: Blood pressure (!) 111/91, pulse 69, temperature 98.2 F (36.8 C), temperature source Oral, resp. rate 18, height 5\' 10"  (1.778 m), weight 91.2 kg, SpO2 93 %. No results found. No results for input(s): WBC, HGB, HCT, PLT in the last 72 hours. No results for input(s): NA, K, CL, CO2, GLUCOSE, BUN, CREATININE, CALCIUM in the last 72 hours.  Intake/Output Summary (Last 24 hours) at 04/07/2020 1255 Last data filed at 04/07/2020 0844 Gross per 24 hour  Intake 360 ml  Output 800 ml  Net -440 ml        Physical Exam: BP (!) 111/91 (BP Location: Left Arm)   Pulse 69   Temp 98.2 F (36.8 C) (Oral)   Resp 18   Ht 5\' 10"  (1.778 m)   Wt 91.2 kg   SpO2 93%   BMI 28.85 kg/m  Constitutional: No distress . Vital signs reviewed. HENT: Normocephalic.  Atraumatic. Eyes: EOMI. No discharge. Cardiovascular: No JVD.  RRR. Respiratory: Normal effort.  No stridor.  Bilateral clear to auscultation. GI: Non-distended.  BS +. Skin: Warm and dry.  Intact. Psych: Flat.  Slowed. Musc: No edema in extremities.  No tenderness in extremities. Neuro: Alert and oriented to person and place only, unchanged Motor: 4-/5 throughout with apraxia, stable Poor attention/concentration, slow improvement  Assessment/Plan: 1. Functional deficits which require 3+ hours per day of interdisciplinary therapy in a comprehensive inpatient rehab setting.  Physiatrist is providing close team supervision and 24 hour management of active medical problems listed below.  Physiatrist and rehab team continue to assess barriers to  discharge/monitor patient progress toward functional and medical goals   Care Tool:  Bathing    Body parts bathed by patient: Chest,Face,Right upper leg,Left upper leg,Right arm,Left arm,Abdomen,Front perineal area,Left lower leg,Right lower leg,Buttocks   Body parts bathed by helper: Buttocks     Bathing assist Assist Level: Contact Guard/Touching assist     Upper Body Dressing/Undressing Upper body dressing   What is the patient wearing?: Pull over shirt    Upper body assist Assist Level: Minimal Assistance - Patient > 75%    Lower Body Dressing/Undressing Lower body dressing      What is the patient wearing?: Underwear/pull up,Pants     Lower body assist Assist for lower body dressing: Minimal Assistance - Patient > 75%     Toileting Toileting    Toileting assist Assist for toileting: Contact Guard/Touching assist     Transfers Chair/bed transfer  Transfers assist     Chair/bed transfer assist level: Contact Guard/Touching assist     Locomotion Ambulation   Ambulation assist   Ambulation activity did not occur: Safety/medical concerns (fatigue, poor motor control/planning, weakness)  Assist level: Contact Guard/Touching assist Assistive device: Walker-rolling Max distance: 174ft   Walk 10 feet activity   Assist  Walk 10 feet activity did not occur: Safety/medical concerns (fatigue, poor motor control/planning, weakness)  Assist level: Contact Guard/Touching assist Assistive device: Walker-rolling   Walk 50 feet activity   Assist Walk 50 feet with 2 turns activity did not occur: Safety/medical concerns (fatigue, poor motor control/planning, weakness)  Assist level: Contact Guard/Touching assist Assistive  device: Walker-rolling    Walk 150 feet activity   Assist Walk 150 feet activity did not occur: Safety/medical concerns (fatigue, poor motor control/planning, weakness)  Assist level: Contact Guard/Touching assist Assistive device:  Walker-rolling    Walk 10 feet on uneven surface  activity   Assist Walk 10 feet on uneven surfaces activity did not occur: Safety/medical concerns (fatigue, poor motor control/planning, weakness)   Assist level: Contact Guard/Touching assist Assistive device: Photographer Will patient use wheelchair at discharge?: No Type of Wheelchair: Manual    Wheelchair assist level: Supervision/Verbal cueing Max wheelchair distance: 138ft    Wheelchair 50 feet with 2 turns activity    Assist        Assist Level: Supervision/Verbal cueing   Wheelchair 150 feet activity     Assist      Assist Level: Supervision/Verbal cueing    Medical Problem List and Plan: 1.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 Antithrombotics: -DVT/anticoagulation:Pharmaceutical:Lovenox -antiplatelet therapy: N/A 3. Pain Management:  Reported with activity per therapies  Tylenol scheduled on 1/26, changed to as needed on 2/9  Appears relatively controlled on 2/10 4. Mood:LCSW to follow for evaluation and support. -antipsychotic agents: See #11. 5. Neuropsych: This patientis notcapable of making decisions onhisown behalf. 6. Skin/Wound Care:Routine pressure relief measures. 7. Fluids/Electrolytes/Nutrition:Monitor I/Os.  8. T2DM with hyperglycemia: New diagnosis with A1C-7.1 on 1/19.  Carb modified diet.   Levemir decreased to 25 units  NovoLog decreased to 3 units, DC'd on 2/9  Continue Metformin and meal coverage.   Use SSI for elevated BS   CBG (last 3)  Recent Labs    04/06/20 2055 04/07/20 0619 04/07/20 1147  GLUCAP 79 85 106*   Relatively controlled on 2/10 9.Acute blood loss anemia:Resolved  Hemoglobin 13.0 on 1/19 10. Elevated Triglycerides: 294 on 1/19 11. Psychosis d/o Schizophrenia. Last documented admission  11/20 (history of non-compliance)  Currently off Abiligy, Cogentin and Risperdal during hospitalization.   Continue Seroquel 100 nightly  Decreased Seroquel to 50 daily on 1/20 to improve daytime alertness, monitor Sleep wake chart.  Appreciate psych recs-started on fluoxetine  Stable on 2/10 12.  Elevated Creatinine: Resolved  Continue to monitor 13. Hyponatremia  Na 135 on 2/8  Continue to monitor 14.  Acute blood loss anemia  Hemoglobin 12.7 on 2/7 15. Visual deficits-appears to be premorbid  Likely secondary to attention +/- diabetic  Stable  LOS: 23 days A FACE TO FACE EVALUATION WAS PERFORMED  Bryan [name] Karis Juba 04/07/2020, 12:55 PM

## 2020-04-07 NOTE — Progress Notes (Signed)
Patient ID: Bryan [name], male   DOB: 11/03/1975, 45 y.o.   MRN: [id number] Met with Mom when here for speech session to inform insulin changed for pt and he is doing well with. He will need insulin once per day, which is doable for Mom to administer prior to her going to work.

## 2020-04-08 LAB — GLUCOSE, CAPILLARY: Glucose-Capillary: 97 mg/dL (ref 70–99)

## 2020-04-08 MED ORDER — AUM MINI PEN NEEDLES 33G X 5 MM MISC: 1.0000 "application " | Freq: Every day | 1 refills | Status: AC

## 2020-04-08 MED ORDER — QUETIAPINE FUMARATE 50 MG PO TABS: 50.0000 mg | ORAL_TABLET | Freq: Every morning | ORAL | 0 refills | Status: DC

## 2020-04-08 MED ORDER — PANTOPRAZOLE SODIUM 40 MG PO TBEC: 40.0000 mg | DELAYED_RELEASE_TABLET | Freq: Every day | ORAL | 0 refills | Status: AC

## 2020-04-08 MED ORDER — DOCUSATE SODIUM 100 MG PO CAPS: 100.0000 mg | ORAL_CAPSULE | Freq: Two times a day (BID) | ORAL | 0 refills | Status: AC

## 2020-04-08 MED ORDER — FLUOXETINE HCL 20 MG PO CAPS: 20.0000 mg | ORAL_CAPSULE | Freq: Every day | ORAL | 0 refills | Status: AC

## 2020-04-08 MED ORDER — METFORMIN HCL ER 500 MG PO TB24: 500.0000 mg | ORAL_TABLET | Freq: Two times a day (BID) | ORAL | 0 refills | Status: DC

## 2020-04-08 MED ORDER — INSULIN DETEMIR 100 UNIT/ML FLEXPEN: 25.0000 [IU] | PEN_INJECTOR | Freq: Every day | SUBCUTANEOUS | 11 refills | Status: DC

## 2020-04-08 MED ORDER — ACETAMINOPHEN 500 MG PO TABS
500.0000 mg | ORAL_TABLET | Freq: Four times a day (QID) | ORAL | 0 refills | Status: DC
Start: 2020-04-08 — End: 2022-12-18

## 2020-04-08 MED ORDER — METOPROLOL TARTRATE 50 MG PO TABS: 50.0000 mg | ORAL_TABLET | Freq: Two times a day (BID) | ORAL | 0 refills | Status: AC

## 2020-04-08 NOTE — Progress Notes (Signed)
[place] PHYSICAL MEDICINE & REHABILITATION PROGRESS NOTE  Subjective/Complaints: Patient seen laying in bed this AM.  He states he slept well overnight.  He is aware of plans for discharge today.   ROS: limited due to cognition, but appears to deny CP, shortness of breath, nausea, vomiting, diarrhea.  Objective: Vital Signs: Blood pressure 109/80, pulse 70, temperature 98.1 F (36.7 C), temperature source Oral, resp. rate 18, height 5\' 10"  (1.778 m), weight 91.2 kg, SpO2 98 %. No results found. No results for input(s): WBC, HGB, HCT, PLT in the last 72 hours. No results for input(s): NA, K, CL, CO2, GLUCOSE, BUN, CREATININE, CALCIUM in the last 72 hours.  Intake/Output Summary (Last 24 hours) at 04/08/2020 0927 Last data filed at 04/08/2020 0848 Gross per 24 hour  Intake 556 ml  Output 1300 ml  Net -744 ml        Physical Exam: BP 109/80 (BP Location: Left Arm)   Pulse 70   Temp 98.1 F (36.7 C) (Oral)   Resp 18   Ht 5\' 10"  (1.778 m)   Wt 91.2 kg   SpO2 98%   BMI 28.85 kg/m  Constitutional: No distress . Vital signs reviewed. HENT: Normocephalic.  Atraumatic. Eyes: EOMI. No discharge. Cardiovascular: No JVD.  RRR. Respiratory: Normal effort.  No stridor.  Bilateral clear to auscultation. GI: Non-distended.  BS +. Skin: Warm and dry.  Intact. Psych: Flat. Slowed.  Musc: No edema in extremities.  No tenderness in extremities. Neuro: Alert and oriented to person and place only, stable Motor: 4-/5 throughout with apraxia, stable Poor attention/concentration, slow improvement  Assessment/Plan: 1. Functional deficits which require 3+ hours per day of interdisciplinary therapy in a comprehensive inpatient rehab setting.  Physiatrist is providing close team supervision and 24 hour management of active medical problems listed below.  Physiatrist and rehab team continue to assess barriers to discharge/monitor patient progress toward functional and medical goals   Care  Tool:  Bathing    Body parts bathed by patient: Chest,Face,Right upper leg,Left upper leg,Right arm,Left arm,Abdomen,Front perineal area,Left lower leg,Right lower leg,Buttocks   Body parts bathed by helper: Buttocks     Bathing assist Assist Level: Contact Guard/Touching assist     Upper Body Dressing/Undressing Upper body dressing   What is the patient wearing?: Pull over shirt    Upper body assist Assist Level: Minimal Assistance - Patient > 75%    Lower Body Dressing/Undressing Lower body dressing      What is the patient wearing?: Underwear/pull up,Pants     Lower body assist Assist for lower body dressing: Minimal Assistance - Patient > 75%     Toileting Toileting    Toileting assist Assist for toileting: Contact Guard/Touching assist     Transfers Chair/bed transfer  Transfers assist     Chair/bed transfer assist level: Contact Guard/Touching assist     Locomotion Ambulation   Ambulation assist   Ambulation activity did not occur: Safety/medical concerns (fatigue, poor motor control/planning, weakness)  Assist level: Contact Guard/Touching assist Assistive device: Walker-rolling Max distance: 124ft   Walk 10 feet activity   Assist  Walk 10 feet activity did not occur: Safety/medical concerns (fatigue, poor motor control/planning, weakness)  Assist level: Contact Guard/Touching assist Assistive device: Walker-rolling   Walk 50 feet activity   Assist Walk 50 feet with 2 turns activity did not occur: Safety/medical concerns (fatigue, poor motor control/planning, weakness)  Assist level: Contact Guard/Touching assist Assistive device: Walker-rolling    Walk 150 feet activity  Assist Walk 150 feet activity did not occur: Safety/medical concerns (fatigue, poor motor control/planning, weakness)  Assist level: Contact Guard/Touching assist Assistive device: Walker-rolling    Walk 10 feet on uneven surface  activity   Assist Walk 10  feet on uneven surfaces activity did not occur: Safety/medical concerns (fatigue, poor motor control/planning, weakness)   Assist level: Contact Guard/Touching assist Assistive device: Photographer Will patient use wheelchair at discharge?: No Type of Wheelchair: Manual    Wheelchair assist level: Supervision/Verbal cueing Max wheelchair distance: 151ft    Wheelchair 50 feet with 2 turns activity    Assist        Assist Level: Supervision/Verbal cueing   Wheelchair 150 feet activity     Assist      Assist Level: Supervision/Verbal cueing    Medical Problem List and Plan: 1.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 Antithrombotics: -DVT/anticoagulation:Pharmaceutical:Lovenox -antiplatelet therapy: N/A 3. Pain Management:  Reported with activity per therapies  Tylenol scheduled on 1/26, changed to as needed on 2/9  Appears relatively controlled on 2/11 4. Mood:LCSW to follow for evaluation and support. -antipsychotic agents: See #11. 5. Neuropsych: This patientis notcapable of making decisions onhisown behalf. 6. Skin/Wound Care:Routine pressure relief measures. 7. Fluids/Electrolytes/Nutrition:Monitor I/Os.  8. T2DM with hyperglycemia: New diagnosis with A1C-7.1 on 1/19.  Carb modified diet.   Levemir decreased to 25 units  NovoLog decreased to 3 units, DC'd on 2/9  Continue Metformin and meal coverage.   Use SSI for elevated BS   CBG (last 3)  Recent Labs    04/07/20 1630 04/07/20 2055 04/08/20 0620  GLUCAP 92 114* 97   Controlled on 2/11 9.Acute blood loss anemia:Resolved  Hemoglobin 13.0 on 1/19 10. Elevated Triglycerides: 294 on 1/19 11. Psychosis d/o Schizophrenia. Last documented admission 11/20 (history of non-compliance)  Currently off Abiligy,  Cogentin and Risperdal during hospitalization.   Continue Seroquel 100 nightly  Decreased Seroquel to 50 daily on 1/20 to improve daytime alertness, monitor Sleep wake chart.  Appreciate psych recs-started on fluoxetine  Stable on 2/11 12.  Elevated Creatinine: Resolved  Continue to monitor 13. Hyponatremia  Na 135 on 2/8  Continue to monitor 14.  Acute blood loss anemia  Hemoglobin 12.7 on 2/7 15. Visual deficits-appears to be premorbid  Likely secondary to attention +/- diabetic  Stable  > 30 minutes spent in total in discharge planning between myself and PA regarding aforementioned, as well discussion regarding DME equipment, follow-up appointments, follow-up therapies, discharge medications, discharge recommendations, answering questions with patient and family.  Please see discharge summary as well.  LOS: 24 days A FACE TO FACE EVALUATION WAS PERFORMED  Bryan [name] 04/08/2020, 9:27 AM

## 2020-04-08 NOTE — Discharge Summary (Addendum)
Physician Discharge Summary  Patient ID: Bryan [name] MRN: [id number] DOB/AGE: [birth year] 44 y.o.  Admit date: 03/15/2020 Discharge date: 04/08/2020  Discharge Diagnoses:  Principal Problem:   Anoxic brain injury [facility]) Active Problems:   Schizophrenia (HCC)   Elevated triglycerides with high cholesterol   Controlled type 2 diabetes mellitus with hyperglycemia (HCC)   Acute blood loss anemia   Visual disturbance   Discharged Condition: stable   Significant Diagnostic Studies: No results found.  Labs:  Basic Metabolic Panel: BMP Latest Ref Rng & Units 04/04/2020 03/28/2020 03/25/2020  Glucose 70 - 99 mg/dL 408(X) 448(J) 856(D)  BUN 6 - 20 mg/dL 10 11 13   Creatinine 0.61 - 1.24 mg/dL 1.49 7.02  Sodium 135 - 145 mmol/L 135 135 136  Potassium 3.5 - 5.1 mmol/L 4.1 4.1 4.1  Chloride 98 - 111 mmol/L 103 102 101  CO2 22 - 32 mmol/L 23 24 24   Calcium 8.9 - 10.3 mg/dL 6.37 85.8    CBC: CBC Latest Ref Rng & Units 04/04/2020 03/28/2020 03/21/2020  WBC 4.0 - 10.5 K/uL 4.8 4.1 4.8  Hemoglobin 13.0 - 17.0 g/dL 12.7(L) 12.6(L) 12.5(L)  Hematocrit 39.0 - 52.0 % 39.7 38.1(L) 38.0(L)  Platelets 150 - 400 K/uL 279 280 323    CBG: Recent Labs  Lab 04/07/20 0619 04/07/20 1147 04/07/20 1630 04/07/20 2055 04/08/20 0620  GLUCAP 85 106* 92 114* 97    Brief HPI:   Bryan [name] is a 45 y.o. male with history of HTN, schizophrenia who was admitted 01/25/2020 with hypotension, lethargy and found to have newly diagnosed DM with DKA and AKI.  He was treated with IV fluids, insulin and started on broad-spectrum antibiotics due to concerns of sepsis.  On 11/30, he was found to be apneic and pulseless requiring 2 rounds of CPR with ACLS protocol with ROSC.  He was intubated and work-up done revealing moderate acute pancreatitis likely due to elevated triglycerides-600.  He was treated with supportive care and MRI done was negative for acute changes.  He required tracheostomy prior to  extubation.  EEG was done due to poorly responsive state and showed moderate diffuse encephalopathy.  He tolerated extubation insult decannulated on 01/06.  Respiratory status was stable and he was tolerating regular diet.  Mentation had improved but he continued to be limited by cognitive deficits diffuse weakness and significant cognitive deficits felt to be due to HPI.  Therapy was ongoing working on pregait activity with standing attempts.  CIR was recommended due to functional decline.   Hospital Course: Bryan [name] was admitted to rehab 03/15/2020 for inpatient therapies to consist of PT, ST and OT at least three hours five days a week. Past admission physiatrist, therapy team and rehab RN have worked together to provide customized collaborative inpatient rehab. He continued to have issues with excessive sleepiness and psychiatry was consulted for input on medications for mood stabilization as well as activation. Psych did not recommend use of activating medication and Prozac was added and titrated up to 20 mg daily. Seroquel was tapered to 50 mg on 01/20 and mood has been stable on current dose. His family has been advised on contacting Mental health on 02/14 to set follow up appointment.   His blood pressures were monitored on TID basis and have been stable. Po intake has been good. His diabetes has been monitored with ac/hs CBG checks and SSI was use prn for tighter BS control. He has had hypoglycemic episodes and Levemir was decreased to 25  units daily and meal coverage was discontinued. His cognition as well as visual deficits likely due to diabetic retinopathy and +/- due to inattention have improved but continue to impact his ability to perform ADLs as well as activity.  Min guard assistance with supervision is recommended and patient/family have been educated on HEP due to lack of HH agencies to take his current insurance.    Rehab course: During patient's stay in rehab weekly team  conferences were held to monitor patient's progress, set goals and discuss barriers to discharge. At admission, patient required mod assist with mobility and total assist with ADL task.  He exhibited moderate cognitive deficits with flat affect, difficulty with recall as well as visual versus cognitive visual impairments. He has had improvement in activity tolerance, balance, postural control as well as ability to compensate for deficits.  He currently requires min assist for basic, medication and for cognitive functioning.  He is showing improvement in sustained attention and participation with min verbal and tactile cues.  He requires assistance with supervision for cognitive tasks and for safety.  Family education was completed with mother and brother.   Discharge disposition: 01-Home or Self Care  Diet: Carb modified  Special Instructions: 1.  No driving or strenuous activity.  Family to provide 24-hour supervision. 2.  Mother to assist with medication management. 3. Monitor triglycerides serially for improvement.  4. Needs to follow up with [facility] as soon as possible after discharge.   Discharge Instructions     Ambulatory referral to Physical Medicine Rehab   Complete by: As directed    4 week follow up appointment      Allergies as of 04/08/2020       Reactions   Fluphenazine Shortness Of Breath, Swelling   Tongue swelling, and shaking   Coffee Bean Extract [coffea Arabica] Swelling   Vancomycin Rash   Bullous rash over trunk, arms, and face         Medication List     STOP taking these medications    benztropine 0.5 MG tablet Commonly known as: COGENTIN   Gerhardt's butt cream Crea   HYDROcodone-acetaminophen 5-325 MG tablet Commonly known as: NORCO/VICODIN   ibuprofen 600 MG tablet Commonly known as: ADVIL   insulin aspart 100 UNIT/ML injection Commonly known as: novoLOG       TAKE these medications    acetaminophen 500 MG tablet Commonly known as:  TYLENOL Take 1 tablet (500 mg total) by mouth every 6 (six) hours. Notes to patient: For headaches  --can change to use as needed.    Aum Mini Pen Needles 33G X 5 MM Misc Generic drug: Insulin Pen Needle 1 application by Does not apply route daily. Notes to patient: For use with insulin pen   docusate sodium 100 MG capsule Commonly known as: COLACE Take 1 capsule (100 mg total) by mouth 2 (two) times daily. Notes to patient: For constipation   FLUoxetine 20 MG capsule Commonly known as: PROZAC Take 1 capsule (20 mg total) by mouth daily. Start taking on: April 09, 2020 Notes to patient: For mood stabilization    insulin detemir 100 UNIT/ML FlexPen Commonly known as: LEVEMIR Inject 25 Units into the skin daily. What changed: how much to take   metFORMIN 500 MG 24 hr tablet Commonly known as: GLUCOPHAGE-XR Take 1 tablet (500 mg total) by mouth 2 (two) times daily with a meal.   metoprolol tartrate 50 MG tablet Commonly known as: LOPRESSOR Take 1 tablet (50 mg total)  by mouth 2 (two) times daily.   multivitamin with minerals Tabs tablet Take 1 tablet by mouth daily.   pantoprazole 40 MG tablet Commonly known as: PROTONIX Take 1 tablet (40 mg total) by mouth daily.   QUEtiapine 50 MG tablet Commonly known as: SEROQUEL Take 1 tablet (50 mg total) by mouth every morning. Start taking on: April 09, 2020 What changed:  medication strength how much to take Another medication with the same name was removed. Continue taking this medication, and follow the directions you see here. Notes to patient: For anxiety/mood stabilization         Follow-up Information     Marcello Fennel, MD Follow up.   Specialty: Physical Medicine and Rehabilitation Why: office will call you with follow up appointment Contact information: [address] [place] [address] [phone]         Fleet Contras, MD. Call.   Specialty: Internal Medicine Why: to set up  for primary care Contact information: [address] [place] [address] [phone]                 Signed: Jacquelynn Cree 04/08/2020, 7:35 PM Patient was seen, face-face, and physical exam performed by me on day of discharge, greater than 30 minutes of total time spent.. Please see progress note from day of discharge as well.  Maryla Morrow, MD, ABPMR

## 2020-04-08 NOTE — Discharge Instructions (Signed)
Inpatient Rehab Discharge Instructions  Bryan [name] Discharge date and time: 04/08/20   Activities/Precautions/ Functional Status: Activity: no lifting, driving, or strenuous exercise till cleared by MD Diet: diabetic diet Wound Care: keep wound clean and dry   Functional status:  ___ No restrictions     ___ Walk up steps independently _X__ 24/7 supervision/assistance   ___ Walk up steps with assistance ___ Intermittent supervision/assistance  ___ Bathe/dress independently ___ Walk with walker     ___ Bathe/dress with assistance ___ Walk Independently    ___ Shower independently ___ Walk with assistance    _X__ Shower with supervision  _X__ No alcohol     ___ Return to work/school ________  Special Instructions: 1. Family needs to provide 24 hours supervision--cannot go out of home without family. 2. Family needs to call [facility] this afternoon or on Monday for appointment/medication evaluation.     COMMUNITY REFERRALS UPON DISCHARGE:  HOME EXERCISE PROGRAM COULD NOT GET HOME HEALTH DUE TO MEDICAID-MOM AWARE OF THIS  Medical Equipment/Items Ordered:HOSPITAL BED, TUB BENCH, Levan Hurst                                                 Agency/Supplier:[facility]  [phone]  GENERAL COMMUNITY RESOURCES FOR PATIENT/FAMILY: Mental Health:FOLLOWED WITH [facility] FOR PSYCH MEDICATION MANAGEMENT   My questions have been answered and I understand these instructions. I will adhere to these goals and the provided educational materials after my discharge from the hospital.  Patient/Caregiver Signature _______________________________ Date __________  Clinician Signature _______________________________________ Date __________  Please bring this form and your medication list with you to all your follow-up doctor's appointments.

## 2020-04-08 NOTE — Progress Notes (Signed)
PA Pam in to discuss discharge instructions. Pt/family in agreement. Pt and family left with pt belongings to private vehicle. No complications noted. Mylo Red, LPN

## 2020-05-09 ENCOUNTER — Other Ambulatory Visit: Payer: Self-pay | Admitting: Physical Medicine and Rehabilitation

## 2020-05-19 ENCOUNTER — Encounter: Payer: Medicaid Other | Attending: Physical Medicine & Rehabilitation | Admitting: Physical Medicine & Rehabilitation

## 2020-05-19 ENCOUNTER — Other Ambulatory Visit: Payer: Self-pay

## 2020-05-19 ENCOUNTER — Encounter: Payer: Self-pay | Admitting: Physical Medicine & Rehabilitation

## 2020-05-19 VITALS — BP 121/81 | HR 123 | Temp 98.6°F | Ht 71.0 in | Wt 185.0 lb

## 2020-05-19 DIAGNOSIS — H539 Unspecified visual disturbance: Secondary | ICD-10-CM

## 2020-05-19 DIAGNOSIS — E1165 Type 2 diabetes mellitus with hyperglycemia: Secondary | ICD-10-CM

## 2020-05-19 DIAGNOSIS — G931 Anoxic brain damage, not elsewhere classified: Secondary | ICD-10-CM | POA: Diagnosis present

## 2020-05-19 DIAGNOSIS — F251 Schizoaffective disorder, depressive type: Secondary | ICD-10-CM

## 2020-05-19 DIAGNOSIS — R269 Unspecified abnormalities of gait and mobility: Secondary | ICD-10-CM | POA: Diagnosis present

## 2020-05-19 MED ORDER — INSULIN DETEMIR 100 UNIT/ML FLEXPEN
25.0000 [IU] | PEN_INJECTOR | Freq: Every day | SUBCUTANEOUS | 0 refills | Status: AC
Start: 2020-05-19 — End: ?

## 2020-05-19 NOTE — Progress Notes (Addendum)
Subjective:    Patient ID: Bryan [name], male    DOB: 1975-10-28, 45 y.o.   MRN: [id number]  HPI Male with history of HTN, schizophrenia who was admitted 01/25/2020 with hypotension, lethargy and found to have newly diagnosed DM with DKA and AKI present for hospital follow up for anoxic encephalopathy.     Admit date: 03/15/2020 Discharge date: 04/08/2020  Mother supplements history. At discharge, he was instructed to follow with PCP, appointment next week. He has 24/7 supervision. He went to [facility]. Pain is controlled. CBGs have been controlled, however, he is running out of medications. He has been taking Psych meds. Vision is stable. He is doing HEP. Had a fall trying to pick up something off the floor. Mother notes improving strength and independence with activities.  Pain Inventory Average Pain 0 Pain Right Now 0 My pain is no pain  In the last 24 hours, has pain interfered with the following? General activity 0 Relation with others 0 Enjoyment of life 0 What TIME of day is your pain at its worst? No pain Sleep (in general) Good  Pain is worse with: no pain Pain improves with: no pain Relief from Meds: no pain  walk with assistance use a walker  disabled: date disabled .  trouble walking  hospital f/u  hospital f/u    History reviewed. No pertinent family history. Social History   Socioeconomic History  . Marital status: Married    Spouse name: Not on file  . Number of children: Not on file  . Years of education: Not on file  . Highest education level: Not on file  Occupational History  . Not on file  Tobacco Use  . Smoking status: Current Every Day Smoker    Packs/day: 0.50    Types: Cigarettes  . Smokeless tobacco: Never Used  Vaping Use  . Vaping Use: Never used  Substance and Sexual Activity  . Alcohol use: Yes    Comment: says drinks a 40 at least once a week. denies drink in 2-3 weeks.   . Drug use: Yes    Types: Cocaine, Marijuana     Comment: marijuana somewhat regularly  . Sexual activity: Never  Other Topics Concern  . Not on file  Social History Narrative   LIves in [place] in an apartment with his brother. Unclear why on medicaid.   Social Determinants of Health   Financial Resource Strain: Not on file  Food Insecurity: Not on file  Transportation Needs: Not on file  Physical Activity: Not on file  Stress: Not on file  Social Connections: Not on file   Past Surgical History:  Procedure Laterality Date  . none    . TRACHEOSTOMY TUBE PLACEMENT N/A 02/13/2020   Procedure: TRACHEOSTOMY;  Surgeon: Newman Pies, MD;  Location: [facility] OR;  Service: ENT;  Laterality: N/A;   Past Medical History:  Diagnosis Date  . Hypertension   . Psychiatric problem    Seen at [facility] for unknown reason. Takes seroquel. History of hearing voices.  Not commandivng voices.   . Schizophrenia (HCC)   . Tremors of nervous system    BP 121/81   Pulse (!) 123   Temp 98.6 F (37 C)   Ht 5\' 11"  (1.803 m)   Wt 185 lb (83.9 kg)   SpO2 95%   BMI 25.80 kg/m   Opioid Risk Score:   Fall Risk Score:  `1  Depression screen PHQ 2/9  No flowsheet data found.  Review of Systems  Constitutional: Negative.   HENT: Negative.   Eyes: Negative.   Respiratory: Negative.   Cardiovascular: Negative.   Gastrointestinal: Negative.   Endocrine: Negative.   Genitourinary: Negative.   Musculoskeletal: Positive for gait problem.  Skin: Negative.   Allergic/Immunologic: Negative.   Neurological: Positive for weakness.  Hematological: Negative.   Psychiatric/Behavioral: Negative.   All other systems reviewed and are negative.      Objective:   Physical Exam  Constitutional: No distress . Vital signs reviewed. HENT: Normocephalic.  Atraumatic. Eyes: EOMI. No discharge. Cardiovascular: No JVD.   Respiratory: Normal effort.  No stridor.   GI: Non-distended.   Skin: Warm and dry.  Intact. Musc: No edema in extremities.  No tenderness  in extremities. Gait: Ataxic Psych: Flat. Slowed, stable.  Neuro: Alert and oriented to person and place only Motor: 4-/5 throughout with apraxia, unchanged Poor attention/concentration, slow improvement     Assessment & Plan:  Male with history of HTN, schizophrenia who was admitted 01/25/2020 with hypotension, lethargy and found to have newly diagnosed DM with DKA and AKI present for hospital follow up for anoxic encephalopathy.  1.  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 T2DM with hyperglycemia: New diagnosis with A1C-7.1 on 1/19.    Controlled per mother  Levemir ordered until established with PCP  3. Elevated Triglycerides:   Follow up with PCP  4. Psychosis d/o Schizophrenia. Last documented admission 11/20 (history of non-compliance)  Cont meds  Follow up with PCP  5. Visual deficits-appears to be premorbid             Likely secondary to attention +/- diabetic +/- age related changes  Recommend appointment with optometrist  6. Gait abnormality  Cont walker for safety  Cont HEP

## 2020-05-24 ENCOUNTER — Other Ambulatory Visit: Payer: Self-pay | Admitting: Internal Medicine

## 2020-05-25 LAB — BASIC METABOLIC PANEL WITH GFR
BUN: 10 mg/dL (ref 7–25)
CO2: 26 mmol/L (ref 20–32)
Calcium: 10.7 mg/dL — ABNORMAL HIGH (ref 8.6–10.3)
Chloride: 101 mmol/L (ref 98–110)
Creat: 0.8 mg/dL (ref 0.60–1.35)
GFR, Est African American: 127 mL/min/{1.73_m2} (ref >=60)
GFR, Est Non African American: 109 mL/min/{1.73_m2} (ref >=60)
Glucose, Bld: 82 mg/dL (ref 65–99)
Potassium: 4.1 mmol/L (ref 3.5–5.3)
Sodium: 138 mmol/L (ref 135–146)

## 2020-05-25 LAB — HEMOGLOBIN A1C W/OUT EAG: Hgb A1c MFr Bld: 5.9 % of total Hgb — ABNORMAL HIGH (ref <5.7)

## 2020-05-25 LAB — VITAMIN D 25 HYDROXY (VIT D DEFICIENCY, FRACTURES): Vit D, 25-Hydroxy: 27 ng/mL — ABNORMAL LOW (ref 30–100)

## 2020-06-07 ENCOUNTER — Other Ambulatory Visit: Payer: Self-pay

## 2020-06-07 ENCOUNTER — Encounter (HOSPITAL_COMMUNITY): Payer: Self-pay

## 2020-06-07 ENCOUNTER — Ambulatory Visit: Payer: Self-pay | Admitting: *Deleted

## 2020-06-07 ENCOUNTER — Emergency Department (HOSPITAL_COMMUNITY): Payer: Medicaid Other

## 2020-06-07 ENCOUNTER — Observation Stay (HOSPITAL_COMMUNITY)
Admission: EM | Admit: 2020-06-07 | Discharge: 2020-06-08 | Disposition: A | Discharge status: Home / Self Care | Payer: Medicaid Other | Attending: Family Medicine | Admitting: Family Medicine

## 2020-06-07 DIAGNOSIS — Z7984 Long term (current) use of oral hypoglycemic drugs: Secondary | ICD-10-CM | POA: Insufficient documentation

## 2020-06-07 DIAGNOSIS — Z79899 Other long term (current) drug therapy: Secondary | ICD-10-CM | POA: Insufficient documentation

## 2020-06-07 DIAGNOSIS — Z20822 Contact with and (suspected) exposure to covid-19: Secondary | ICD-10-CM | POA: Diagnosis not present

## 2020-06-07 DIAGNOSIS — R42 Dizziness and giddiness: Principal | ICD-10-CM

## 2020-06-07 DIAGNOSIS — I10 Essential (primary) hypertension: Secondary | ICD-10-CM | POA: Diagnosis present

## 2020-06-07 DIAGNOSIS — Z794 Long term (current) use of insulin: Secondary | ICD-10-CM | POA: Diagnosis not present

## 2020-06-07 DIAGNOSIS — I5032 Chronic diastolic (congestive) heart failure: Secondary | ICD-10-CM | POA: Diagnosis not present

## 2020-06-07 DIAGNOSIS — E860 Dehydration: Secondary | ICD-10-CM

## 2020-06-07 DIAGNOSIS — F17210 Nicotine dependence, cigarettes, uncomplicated: Secondary | ICD-10-CM | POA: Insufficient documentation

## 2020-06-07 DIAGNOSIS — K219 Gastro-esophageal reflux disease without esophagitis: Secondary | ICD-10-CM | POA: Insufficient documentation

## 2020-06-07 DIAGNOSIS — I110 Hypertensive heart disease with heart failure: Secondary | ICD-10-CM | POA: Insufficient documentation

## 2020-06-07 DIAGNOSIS — E1165 Type 2 diabetes mellitus with hyperglycemia: Secondary | ICD-10-CM | POA: Diagnosis not present

## 2020-06-07 DIAGNOSIS — N179 Acute kidney failure, unspecified: Secondary | ICD-10-CM | POA: Diagnosis not present

## 2020-06-07 DIAGNOSIS — R55 Syncope and collapse: Secondary | ICD-10-CM

## 2020-06-07 HISTORY — DX: Type 2 diabetes mellitus with hyperglycemia: E11.65

## 2020-06-07 LAB — CBC
HCT: 45.9 % (ref 39.0–52.0)
Hemoglobin: 15 g/dL (ref 13.0–17.0)
MCH: 29.4 pg (ref 26.0–34.0)
MCHC: 32.7 g/dL (ref 30.0–36.0)
MCV: 90 fL (ref 80.0–100.0)
Platelets: 323 10*3/uL (ref 150–400)
RBC: 5.1 MIL/uL (ref 4.22–5.81)
RDW: 14.4 % (ref 11.5–15.5)
WBC: 9.1 10*3/uL (ref 4.0–10.5)
nRBC: 0 % (ref 0.0–0.2)

## 2020-06-07 LAB — COMPREHENSIVE METABOLIC PANEL
ALT: 11 U/L (ref 0–44)
AST: 12 U/L — ABNORMAL LOW (ref 15–41)
Albumin: 4.3 g/dL (ref 3.5–5.0)
Alkaline Phosphatase: 107 U/L (ref 38–126)
Anion gap: 9 (ref 5–15)
BUN: 14 mg/dL (ref 6–20)
CO2: 23 mmol/L (ref 22–32)
Calcium: 10.9 mg/dL — ABNORMAL HIGH (ref 8.9–10.3)
Chloride: 103 mmol/L (ref 98–111)
Creatinine, Ser: 1.68 mg/dL — ABNORMAL HIGH (ref 0.61–1.24)
GFR, Estimated: 51 mL/min — ABNORMAL LOW (ref >60)
Glucose, Bld: 120 mg/dL — ABNORMAL HIGH (ref 70–99)
Potassium: 4.5 mmol/L (ref 3.5–5.1)
Sodium: 135 mmol/L (ref 135–145)
Total Bilirubin: 1 mg/dL (ref 0.3–1.2)
Total Protein: 7.3 g/dL (ref 6.5–8.1)

## 2020-06-07 LAB — LIPASE, BLOOD: Lipase: 29 U/L (ref 11–51)

## 2020-06-07 LAB — TROPONIN I (HIGH SENSITIVITY): Troponin I (High Sensitivity): 3 ng/L (ref <18)

## 2020-06-07 LAB — CBG MONITORING, ED: Glucose-Capillary: 128 mg/dL — ABNORMAL HIGH (ref 70–99)

## 2020-06-07 MED ORDER — SODIUM CHLORIDE 0.9 % IV BOLUS
1000.0000 mL | Freq: Once | INTRAVENOUS | Status: AC
  Administered 2020-06-07: 1000 mL via INTRAVENOUS

## 2020-06-07 NOTE — ED Triage Notes (Signed)
Pt states he felt like he was going to pass out earlier today. He got dizzy and sat down. Pt denies falling.

## 2020-06-07 NOTE — ED Provider Notes (Signed)
[facility] EMERGENCY DEPARTMENT Provider Note   CSN: [id number] Arrival date & time: 06/07/20  1756     History Chief Complaint  Patient presents with  . Dizziness    Bryan [name] is a 45 y.o. male.  HPI Aurthur [name] is a 45 yo male with a past medical history of schizoaffective disorder, T2DM, HTN, anoxic brain injury, toxic metabolic encephalopathy that presents today with mother for dizziness. Patient stated he has been experiencing episodes of dizziness since being discharged from the hospital a few months ago --January 2022. He stated he felt dizzy, unsteadiness, before leaving the house to go to the barber shop and when he was finished with his haircut and stood up, he felt like he was going to pass out. He stated he felt unsteady and his eyes started to water. His mother stated she then took him home and checked his blood pressure and cannot remember the exact numbers but remembered the reading being approximately 70s/60s. Patient denied any palpitations, vision changes, chest pain, shortness of breath, or headache. He did endorse some photophobia, nausea, and dry mouth. Mother added that patient drinks minimal water and most of his fluid intake consists of sugar free juices.        Past Medical History:  Diagnosis Date  . Hypertension   . Psychiatric problem    Seen at [facility] for unknown reason. Takes seroquel. History of hearing voices.  Not commandivng voices.   . Schizophrenia (HCC)   . Tremors of nervous system     Patient Active Problem List   Diagnosis Date Noted  . Abnormality of gait 05/19/2020  . Visual disturbance   . Acute blood loss anemia   . Schizophrenia (HCC)   . Elevated triglycerides with high cholesterol   . Controlled type 2 diabetes mellitus with hyperglycemia (HCC)   . 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  . Pressure injury of skin 02/16/2020  . Toxic metabolic encephalopathy   . AKI (acute kidney injury) (HCC)   .  Dehydration   . Pancreatitis   . Respiratory failure (HCC)   . DKA (diabetic ketoacidosis) (HCC) 01/25/2020  . 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 Paranoid schizophrenia (HCC)   . Undifferentiated schizophrenia (HCC)   . 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 Illiterate 10/30/2011  . Schizoaffective disorder, depressive type (HCC) 10/27/2011  . Schizoaffective disorder (HCC) 08/10/2011  . Observed seizure-like activity (HCC) 08/09/2011  . PAIN IN JOINT, MULTIPLE SITES 02/16/2010  . FATIGUE 02/16/2010  . Shortness of breath 02/16/2010    Past Surgical History:  Procedure Laterality Date  . none    . TRACHEOSTOMY TUBE PLACEMENT N/A 02/13/2020   Procedure: TRACHEOSTOMY;  Surgeon: Newman Pies, MD;  Location: [facility] OR;  Service: ENT;  Laterality: N/A;       History reviewed. No pertinent family history.  Social History   Tobacco Use  . Smoking status: Current Every Day Smoker    Packs/day: 0.50    Types: Cigarettes  . Smokeless tobacco: Never Used  Vaping Use  . Vaping Use: Never used  Substance Use Topics  . Alcohol use: Yes    Comment: says drinks a 40 at least once a week. denies drink in 2-3 weeks.   . Drug use: Yes    Types: Cocaine, Marijuana    Comment: marijuana somewhat regularly    Home Medications Prior to Admission medications   Medication Sig Start Date End Date Taking? Authorizing Provider  FLUoxetine (  PROZAC) 20 MG capsule Take 1 capsule (20 mg total) by mouth daily. 04/09/20  Yes Love, Evlyn KannerPamela S, PA-C  insulin detemir (LEVEMIR) 100 UNIT/ML FlexPen Inject 25 Units into the skin daily. 05/19/20  Yes Marcello FennelPatel, Ankit Anil, MD  metFORMIN (GLUCOPHAGE-XR) 500 MG 24 hr tablet Take 1 tablet (500 mg total) by mouth 2 (two) times daily with a meal. 04/08/20  Yes Love, Evlyn KannerPamela S, PA-C  metoprolol tartrate (LOPRESSOR) 50 MG tablet Take 1 tablet (50 mg total) by mouth 2 (two) times daily. 04/08/20  Yes Love, Evlyn KannerPamela S, PA-C  pantoprazole  (PROTONIX) 40 MG tablet Take 1 tablet (40 mg total) by mouth daily. 04/08/20  Yes Love, Evlyn KannerPamela S, PA-C  QUEtiapine (SEROQUEL) 50 MG tablet Take 1 tablet (50 mg total) by mouth every morning. 04/09/20  Yes Love, Evlyn KannerPamela S, PA-C  acetaminophen (TYLENOL) 500 MG tablet Take 1 tablet (500 mg total) by mouth every 6 (six) hours. Patient not taking: No sig reported 04/08/20   Love, Evlyn KannerPamela S, PA-C  docusate sodium (COLACE) 100 MG capsule Take 1 capsule (100 mg total) by mouth 2 (two) times daily. Patient not taking: No sig reported 04/08/20   Love, Evlyn KannerPamela S, PA-C  Insulin Pen Needle (AUM MINI PEN NEEDLES) 33G X 5 MM MISC 1 application by Does not apply route daily. 04/08/20   Love, Evlyn KannerPamela S, PA-C  Multiple Vitamin (MULTIVITAMIN WITH MINERALS) TABS tablet Take 1 tablet by mouth daily. Patient not taking: No sig reported 03/15/20   Belva AgeeKatsadouros, Vasilios, MD    Allergies    Fluphenazine, Coffee bean extract [coffea arabica], and Vancomycin  Review of Systems   Review of Systems  Constitutional: Negative for chills and fever.  HENT: Negative for congestion.   Eyes: Negative for pain.  Respiratory: Negative for cough and shortness of breath.   Cardiovascular: Negative for chest pain and leg swelling.  Gastrointestinal: Negative for abdominal pain, diarrhea, nausea and vomiting.  Genitourinary: Negative for dysuria.  Musculoskeletal: Negative for myalgias.  Skin: Negative for rash.  Neurological: Positive for light-headedness. Negative for dizziness and headaches.    Physical Exam Updated Vital Signs BP 98/71   Pulse 63   Temp 98.4 F (36.9 C) (Oral)   Resp 12   Ht 5\' 11"  (1.803 m)   Wt 68 kg   SpO2 96%   BMI 20.92 kg/m   Physical Exam Vitals and nursing note reviewed.  Constitutional:      General: He is not in acute distress.    Comments: Flat affect, pleasant Pt appears stated age in no acute distress  HENT:     Head: Normocephalic and atraumatic.     Nose: Nose normal.  Eyes:      General: No scleral icterus. Cardiovascular:     Rate and Rhythm: Normal rate and regular rhythm.     Pulses: Normal pulses.     Heart sounds: Normal heart sounds.  Pulmonary:     Effort: Pulmonary effort is normal. No respiratory distress.     Breath sounds: No wheezing.  Abdominal:     Palpations: Abdomen is soft.     Tenderness: There is no abdominal tenderness.  Musculoskeletal:     Cervical back: Normal range of motion.     Right lower leg: No edema.     Left lower leg: No edema.  Skin:    General: Skin is warm and dry.     Capillary Refill: Capillary refill takes less than 2 seconds.  Neurological:  Mental Status: He is alert. Mental status is at baseline.     Comments: Alert and oriented to self, place, time and event.   Speech is fluent, clear without dysarthria or dysphasia.   Strength 5/5 in upper/lower extremities  Sensation intact in upper/lower extremities   CN I not tested  CN II grossly intact visual fields bilaterally. Did not visualize posterior eye.   CN III, IV, VI PERRLA and EOMs intact bilaterally  CN V Intact sensation to sharp and light touch to the face  CN VII facial movements symmetric  CN VIII not tested  CN IX, X no uvula deviation, symmetric rise of soft palate  CN XI 5/5 SCM and trapezius strength bilaterally  CN XII Midline tongue protrusion, symmetric L/R movements   Psychiatric:        Mood and Affect: Mood normal.        Behavior: Behavior normal.     ED Results / Procedures / Treatments   Labs (all labs ordered are listed, but only abnormal results are displayed) Labs Reviewed  COMPREHENSIVE METABOLIC PANEL - Abnormal; Notable for the following components:      Result Value   Glucose, Bld 120 (*)    Creatinine, Ser 1.68 (*)    Calcium 10.9 (*)    AST 12 (*)    GFR, Estimated 51 (*)    All other components within normal limits  CBG MONITORING, ED - Abnormal; Notable for the following components:   Glucose-Capillary 128 (*)     All other components within normal limits  SARS CORONAVIRUS 2 (TAT 6-24 HRS)  CBC  LIPASE, BLOOD  URINALYSIS, ROUTINE W REFLEX MICROSCOPIC  TROPONIN I (HIGH SENSITIVITY)  TROPONIN I (HIGH SENSITIVITY)    EKG EKG Interpretation  Date/Time:  Tuesday June 07 2020 19:35:31 EDT Ventricular Rate:  66 PR Interval:  134 QRS Duration: 76 QT Interval:  394 QTC Calculation: 413 R Axis:   28 Text Interpretation: Normal sinus rhythm Nonspecific ST and T wave abnormality Abnormal ECG No significant change since last tracing Confirmed by Richardean Canal [phone]) on 06/07/2020 8:42:26 PM   Radiology CT Head Wo Contrast  Result Date: 06/07/2020 CLINICAL DATA:  Altered level of consciousness EXAM: CT HEAD WITHOUT CONTRAST TECHNIQUE: Contiguous axial images were obtained from the base of the skull through the vertex without intravenous contrast. COMPARISON:  01/28/2020, 01/31/2020 FINDINGS: Brain: No acute infarct or hemorrhage. Lateral ventricles and midline structures are unremarkable. No acute extra-axial fluid collections. No mass effect. Vascular: No hyperdense vessel or unexpected calcification. Skull: Normal. Negative for fracture or focal lesion. Sinuses/Orbits: No acute finding. Other: None. IMPRESSION: 1. No acute intracranial process. Electronically Signed   By: Sharlet Salina M.D.   On: 06/07/2020 21:27    Procedures Procedures   Medications Ordered in ED Medications  sodium chloride 0.9 % bolus 1,000 mL (1,000 mLs Intravenous New Bag/Given 06/07/20 2300)    ED Course  I have reviewed the triage vital signs and the nursing notes.  Pertinent labs & imaging results that were available during my care of the patient were reviewed by me and considered in my medical decision making (see chart for details).    MDM Rules/Calculators/A&P                          Patient with significant past medical history detailed in HPI presented today for episodes of lightheadedness that occurs  intermittently over the past several months since he  was discharged from the hospital for his DKA/cardiac arrest/50-day hospital/ICU stay.  He states that he was having episodes of this even before he was discharged.  He has been taking his metoprolol as prescribed.  His mother checked his blood pressure today and found it to be 70s/60s  He was significantly lightheaded today.  Did not pass out or hit his head.  Blood pressures here have been on the low side 90s/70s.  His last dose of metoprolol 50 mg was this morning.  CMP notable for creatinine of 1.68 which is increased from 2 weeks ago.  Troponin x1 within normal limits.  Lipase within normal months, CBC unremarkable.  Urinalysis pending.  Covid test pending.  EKG unremarkable. CT head unremarkable.  Patient admitted to Dr. Arlean Hopping of internal medicine Patient and family understanding of plan. Patient provided with 1 L normal saline.  Final Clinical Impression(s) / ED Diagnoses Final diagnoses:  Lightheadedness  Near syncope  Dehydration    Rx / DC Orders ED Discharge Orders    None       Gailen Shelter, [place] 06/08/20 0040    Charlynne Pander, MD 06/08/20 1504

## 2020-06-07 NOTE — ED Triage Notes (Signed)
Emergency Medicine Provider Triage Evaluation Note  Bryan [name] , a 45 y.o. male  was evaluated in triage.  Pt complains of presents with dizziness that started approxi-1 hour ago, and has since resolved.  He has no other complaints at this time, states that usually he is discharged in the hospital he is on medications but thinks it made constantly dizzy.  Denies headaches, change in vision, paresthesia weakness upper lower extremities..  Review of Systems  Positive: Dizziness, feeling weak Negative: Denies headaches, paresthesias  Physical Exam  BP 91/73 (BP Location: Left Arm)   Pulse 65   Temp 98.4 F (36.9 C) (Oral)   Resp 15   Ht 5\' 11"  (1.803 m)   Wt 68 kg   SpO2 98%   BMI 20.92 kg/m  Gen:   Awake, no distress , seems a bit somnolent HEENT:  Atraumatic  Resp:  Normal effort  Cardiac:  Normal rate  MSK:   Moves extremities without difficulty  Neuro:  Cranial nerves II through XII grossly intact, upper extremity pronation intact with finger-to-nose, unable to assess gait as he is unable to walk.  Medical Decision Making  Medically screening exam initiated at 7:46 PM.  Appropriate orders placed.  Gurinder Toral was informed that the remainder of the evaluation will be completed by another provider, this initial triage assessment does not replace that evaluation, and the importance of remaining in the ED until their evaluation is complete.  Clinical Impression  Patient presents with dizziness, patient will need further work-up lab work and imaging ordered.   [name], PA-C 06/07/20 1948

## 2020-06-07 NOTE — Telephone Encounter (Signed)
[name] calling, pt present. States BP 73/57 HR 63 few minutes prior to call. States pt was dizzy, "Not acting right." States BP usually 140/80.Checked during call 76/60. States "Very sleepy." BS checked during call 170. Pt directed to ED, states will follow disposition.   Reason for Disposition . [1] Systolic BP < 80 AND [2] NOT dizzy, lightheaded or weak  Answer Assessment - Initial Assessment Questions 1. BLOOD PRESSURE: "What is the blood pressure?" "Did you take at least two measurements 5 minutes apart?"     73/57  HR 63    76/63 2. ONSET: "When did you take your blood pressure?"     Few minutes prior to call 3. HOW: "How did you obtain the blood pressure?" (e.g., visiting nurse, automatic home BP monitor)     Home monitor 4. HISTORY: "Do you have a history of low blood pressure?" "What is your blood pressure normally?"     no 5. MEDICATIONS: "Are you taking any medications for blood pressure?" If Yes, ask: "Have they been changed recently?"     Yes, no changes 6. PULSE RATE: "Do you know what your pulse rate is?"      63 7. OTHER SYMPTOMS: "Have you been sick recently?" "Have you had a recent injury?"     no  Protocols used: BLOOD PRESSURE - LOW-A-AH

## 2020-06-08 ENCOUNTER — Observation Stay (HOSPITAL_COMMUNITY): Payer: Medicaid Other

## 2020-06-08 ENCOUNTER — Encounter (HOSPITAL_COMMUNITY): Payer: Self-pay | Admitting: Internal Medicine

## 2020-06-08 DIAGNOSIS — N179 Acute kidney failure, unspecified: Secondary | ICD-10-CM

## 2020-06-08 DIAGNOSIS — E860 Dehydration: Secondary | ICD-10-CM

## 2020-06-08 DIAGNOSIS — I5032 Chronic diastolic (congestive) heart failure: Secondary | ICD-10-CM

## 2020-06-08 DIAGNOSIS — R42 Dizziness and giddiness: Secondary | ICD-10-CM | POA: Diagnosis not present

## 2020-06-08 DIAGNOSIS — I10 Essential (primary) hypertension: Secondary | ICD-10-CM | POA: Diagnosis present

## 2020-06-08 DIAGNOSIS — Z794 Long term (current) use of insulin: Secondary | ICD-10-CM

## 2020-06-08 DIAGNOSIS — E1165 Type 2 diabetes mellitus with hyperglycemia: Secondary | ICD-10-CM

## 2020-06-08 DIAGNOSIS — R55 Syncope and collapse: Secondary | ICD-10-CM

## 2020-06-08 LAB — COMPREHENSIVE METABOLIC PANEL
ALT: 10 U/L (ref 0–44)
AST: 11 U/L — ABNORMAL LOW (ref 15–41)
Albumin: 3.5 g/dL (ref 3.5–5.0)
Alkaline Phosphatase: 90 U/L (ref 38–126)
Anion gap: 6 (ref 5–15)
BUN: 12 mg/dL (ref 6–20)
CO2: 24 mmol/L (ref 22–32)
Calcium: 10.1 mg/dL (ref 8.9–10.3)
Chloride: 105 mmol/L (ref 98–111)
Creatinine, Ser: 1.07 mg/dL (ref 0.61–1.24)
GFR, Estimated: >60 mL/min (ref >60)
Glucose, Bld: 118 mg/dL — ABNORMAL HIGH (ref 70–99)
Potassium: 3.9 mmol/L (ref 3.5–5.1)
Sodium: 135 mmol/L (ref 135–145)
Total Bilirubin: 0.9 mg/dL (ref 0.3–1.2)
Total Protein: 5.9 g/dL — ABNORMAL LOW (ref 6.5–8.1)

## 2020-06-08 LAB — CBC
HCT: 39.8 % (ref 39.0–52.0)
Hemoglobin: 13 g/dL (ref 13.0–17.0)
MCH: 29 pg (ref 26.0–34.0)
MCHC: 32.7 g/dL (ref 30.0–36.0)
MCV: 88.6 fL (ref 80.0–100.0)
Platelets: 245 10*3/uL (ref 150–400)
RBC: 4.49 MIL/uL (ref 4.22–5.81)
RDW: 14.6 % (ref 11.5–15.5)
WBC: 7.7 10*3/uL (ref 4.0–10.5)
nRBC: 0 % (ref 0.0–0.2)

## 2020-06-08 LAB — URINALYSIS, ROUTINE W REFLEX MICROSCOPIC
Bilirubin Urine: NEGATIVE
Glucose, UA: NEGATIVE mg/dL
Hgb urine dipstick: NEGATIVE
Ketones, ur: 5 mg/dL — AB
Leukocytes,Ua: NEGATIVE
Nitrite: NEGATIVE
Protein, ur: NEGATIVE mg/dL
Specific Gravity, Urine: 1.017 (ref 1.005–1.030)
pH: 5 (ref 5.0–8.0)

## 2020-06-08 LAB — CBG MONITORING, ED
Glucose-Capillary: 123 mg/dL — ABNORMAL HIGH (ref 70–99)
Glucose-Capillary: 142 mg/dL — ABNORMAL HIGH (ref 70–99)

## 2020-06-08 LAB — TROPONIN I (HIGH SENSITIVITY)
Troponin I (High Sensitivity): 3 ng/L (ref <18)
Troponin I (High Sensitivity): 4 ng/L (ref <18)

## 2020-06-08 LAB — PHOSPHORUS: Phosphorus: 3.4 mg/dL (ref 2.5–4.6)

## 2020-06-08 LAB — MAGNESIUM: Magnesium: 1.8 mg/dL (ref 1.7–2.4)

## 2020-06-08 LAB — SARS CORONAVIRUS 2 (TAT 6-24 HRS): SARS Coronavirus 2: NEGATIVE

## 2020-06-08 LAB — CREATININE, URINE, RANDOM: Creatinine, Urine: 267.59 mg/dL

## 2020-06-08 LAB — SODIUM, URINE, RANDOM: Sodium, Ur: 75 mmol/L

## 2020-06-08 MED ORDER — INSULIN DETEMIR 100 UNIT/ML ~~LOC~~ SOLN
15.0000 [IU] | Freq: Every day | SUBCUTANEOUS | Status: DC
  Administered 2020-06-08: 15 [IU] via SUBCUTANEOUS
  Filled 2020-06-08: qty 0.15

## 2020-06-08 MED ORDER — LACTATED RINGERS IV SOLN: INTRAVENOUS | Status: DC

## 2020-06-08 MED ORDER — PANTOPRAZOLE SODIUM 40 MG PO TBEC
40.0000 mg | DELAYED_RELEASE_TABLET | Freq: Every day | ORAL | Status: DC
  Administered 2020-06-08: 40 mg via ORAL
  Filled 2020-06-08: qty 1

## 2020-06-08 MED ORDER — ACETAMINOPHEN 650 MG RE SUPP: 650.0000 mg | Freq: Four times a day (QID) | RECTAL | Status: DC | PRN

## 2020-06-08 MED ORDER — FLUOXETINE HCL 20 MG PO CAPS
20.0000 mg | ORAL_CAPSULE | Freq: Every day | ORAL | Status: DC
  Administered 2020-06-08: 20 mg via ORAL
  Filled 2020-06-08: qty 1

## 2020-06-08 MED ORDER — ACETAMINOPHEN 325 MG PO TABS: 650.0000 mg | ORAL_TABLET | Freq: Four times a day (QID) | ORAL | Status: DC | PRN

## 2020-06-08 MED ORDER — INSULIN ASPART 100 UNIT/ML ~~LOC~~ SOLN
0.0000 [IU] | Freq: Three times a day (TID) | SUBCUTANEOUS | Status: DC
  Administered 2020-06-08: 2 [IU] via SUBCUTANEOUS

## 2020-06-08 MED ORDER — QUETIAPINE FUMARATE 50 MG PO TABS
50.0000 mg | ORAL_TABLET | Freq: Every morning | ORAL | Status: DC
  Administered 2020-06-08: 50 mg via ORAL
  Filled 2020-06-08: qty 1

## 2020-06-08 NOTE — ED Notes (Signed)
Pt provided urinal, informed pt that a urine sample is needed

## 2020-06-08 NOTE — H&P (Signed)
History and Physical    PLEASE NOTE THAT DRAGON DICTATION SOFTWARE WAS USED IN THE CONSTRUCTION OF THIS NOTE.   Bryan [name] ZOX:[id number] DOB: [birth year] DOA: 06/07/2020  PCP: Fleet Contras, MD Patient coming from: home   I have personally briefly reviewed patient's old medical records in [facility]  Chief Complaint: dizziness  HPI: Bryan [name] is a 45 y.o. male with medical history significant for cardiac arrest in November 2021, anoxic brain injury, type 2 diabetes mellitus with most recent hemoglobin A1c 5.9% when checked in March 2022, chronic diastolic heart failure, hypertension, who is admitted to [facility] on 06/07/2020 with acute kidney injury after presenting from home to [facility] ED complaining of dizziness.   The patient reports that he has been experiencing intermittent dizziness over the last few months following discharge from the hospital after experiencing cardiac arrest during November 2021 with prolonged intubation and subsequently tracheostomy.  Prior to discharge from the hospital he was started on Lopressor 50 mg p.o. twice daily, upon which he reports good ensuing compliance.  On 01/27/2020 he underwent echocardiogram which showed left ventricular hyperdynamic function with LVEF 70 to 75%, grade 1 diastolic dysfunction, no evidence of significant valvular pathology.  This echocardiogram was associated with suboptimal view of the ventricular wall to evaluate for the presence of focal wall motion abnormalities.   Over the last few months following his cardiac arrest, the patient reports that he has intermittently experienced dizziness and lightheadedness upon standing from a seated position.  He has never felt as if he was going to pass out as a result of this dizziness until he experienced a presyncopal event earlier today, prompting him to present to [facility] emergency department for further evaluation.  Earlier this afternoon, the patient reports that he  attempted to stand from a seated position, at which time a became dizzy, started seeing dark spots, and overall had the sensation of he may lose consciousness.  However, he confirms that he did not lose consciousness, and that the sensation resolved within approximately 10 seconds of onset.  He notes associated palpitations that were limited to the 5 to 10 seconds during which she was experiencing the dizziness. he denies any associated chest pain, shortness of breath, diaphoresis.  Denies any associated vertigo.  No recent melena or hematochezia.  No recent nausea, vomiting, or diarrhea.  He acknowledges that he typically consumes very little water on a given day, and that he has not adjusted his water consumption recent increase in ambient temperatures. Denies any recent trauma.  No generalized tonic-clonic activity, tongue biting, or loss of bowel/bladder function.  No recent headaches, acute focal weakness, acute focal paresthesias, numbness, slurred speech, facial droop, or dysphagia.  Denies any recent peripheral edema, calf tenderness, lower extremity erythema, or hemoptysis.  Aside from his Lopressor, the patient denies use of any additional antihypertensive medications at home.  He specifically denies any overt syncopal events over the last few months, and he he has not fallen as a result of any of these episodes involving dizziness..   Denies any recent subjective fever, chills, rigors, or generalized myalgias.  No recent neck stiffness, rhinitis, rhinorrhea, sore throat, wheezing, cough, abdominal pain, or rash.  No recent known COVID-19 exposures.  Per chart review, the patient has history of type 2 diabetes mellitus, with most recent hemoglobin A1c noted to be 5.9% on 05/24/2020.  He is on Levemir 25 units subcu daily as an outpatient, in the absence of any short acting insulin.  He is also on Metformin at home, in the absence of any additional oral hypoglycemic agents, including the use of  sulfonylureas.    ED Course:  Vital signs in the ED were notable for the following: Temperature max 98.4, heart rate 63-90; blood pressure 91/73 -109/72; respiratory rate 14-15; oxygen saturation 97 to 98% on room air.  Labs were notable for the following: CMP was notable for the following: Sodium 135, potassium 4.5, bicarbonate 23, BUN 14, creatinine 1.68 relative to baseline creatinine range of 0.8-1.0 as well as most recent prior value of 0.8 on 05/15/2020, glucose 120, calcium 10.9 relative to most recent prior value 10.7 on 05/24/2020, albumin 4.3.  High-sensitivity troponin I x2 have been unchanged at 3.  Urinalysis was ordered, with result currently pending.  CBC notable for white blood cell count of 9100 and hemoglobin 15.  COVID-19/influenza PCR were ordered in the ED, with result currently pending.  EKG, by way of comparison to most recent prior from 03/18/2020, showed normal sinus rhythm with a rate 66, normal intervals, nonspecific T wave flattening in aVL, nonspecific T wave inversion in V2, and no evidence of ST changes, including no evidence of ST elevation.  Noncontrast CT of the head showed no evidence of acute intracranial process.  While in the ED, the following were administered: Normal saline x1 L bolus.  Subsequently, the patient was admitted for overnight observation to the med telemetry floor for further evaluation management of presenting acute kidney injury.     Review of Systems: As per HPI otherwise 10 point review of systems negative.   Past Medical History:  Diagnosis Date  . Hypertension   . Psychiatric problem    Seen at [facility] for unknown reason. Takes seroquel. History of hearing voices.  Not commandivng voices.   . Schizophrenia (HCC)   . Tremors of nervous system     Past Surgical History:  Procedure Laterality Date  . none    . TRACHEOSTOMY TUBE PLACEMENT N/A 02/13/2020   Procedure: TRACHEOSTOMY;  Surgeon: Newman Pies, MD;  Location: [facility] OR;  Service:  ENT;  Laterality: N/A;    Social History:  reports that he has been smoking cigarettes. He has been smoking about 0.50 packs per day. He has never used smokeless tobacco. He reports current alcohol use. He reports current drug use. Drugs: Cocaine and Marijuana.   Allergies  Allergen Reactions  . Fluphenazine Shortness Of Breath and Swelling    Tongue swelling, and shaking  . Coffee Bean Extract [Coffea Arabica] Swelling  . Vancomycin Rash    Bullous rash over trunk, arms, and face     Family history reviewed and not pertinent    Prior to Admission medications   Medication Sig Start Date End Date Taking? Authorizing Provider  FLUoxetine (PROZAC) 20 MG capsule Take 1 capsule (20 mg total) by mouth daily. 04/09/20  Yes Love, Evlyn Kanner, PA-C  insulin detemir (LEVEMIR) 100 UNIT/ML FlexPen Inject 25 Units into the skin daily. 05/19/20  Yes Marcello Fennel, MD  metFORMIN (GLUCOPHAGE-XR) 500 MG 24 hr tablet Take 1 tablet (500 mg total) by mouth 2 (two) times daily with a meal. 04/08/20  Yes Love, Evlyn Kanner, PA-C  metoprolol tartrate (LOPRESSOR) 50 MG tablet Take 1 tablet (50 mg total) by mouth 2 (two) times daily. 04/08/20  Yes Love, Evlyn Kanner, PA-C  pantoprazole (PROTONIX) 40 MG tablet Take 1 tablet (40 mg total) by mouth daily. 04/08/20  Yes Love, Evlyn Kanner, PA-C  QUEtiapine (SEROQUEL) 50 MG  tablet Take 1 tablet (50 mg total) by mouth every morning. 04/09/20  Yes Love, Evlyn Kanner, PA-C  acetaminophen (TYLENOL) 500 MG tablet Take 1 tablet (500 mg total) by mouth every 6 (six) hours. Patient not taking: No sig reported 04/08/20   Love, Evlyn Kanner, PA-C  docusate sodium (COLACE) 100 MG capsule Take 1 capsule (100 mg total) by mouth 2 (two) times daily. Patient not taking: No sig reported 04/08/20   Love, Evlyn Kanner, PA-C  Insulin Pen Needle (AUM MINI PEN NEEDLES) 33G X 5 MM MISC 1 application by Does not apply route daily. 04/08/20   Love, Evlyn Kanner, PA-C  Multiple Vitamin (MULTIVITAMIN WITH MINERALS) TABS  tablet Take 1 tablet by mouth daily. Patient not taking: No sig reported 03/15/20   Belva Agee, MD     Objective    Physical Exam: Vitals:   06/07/20 2142 06/07/20 2215 06/07/20 2315 06/07/20 2345  BP:  104/73 98/71 98/76   Pulse: 68 67 63 90  Resp: 16 14 12 15   Temp:      TempSrc:      SpO2: 96% 96% 96% 98%  Weight:      Height:        General: appears to be stated age; alert, oriented Skin: warm, dry, no rash Head:  AT/Globe Mouth:  Oral mucosa membranes appear dry, normal dentition Neck: supple; trachea midline Heart:  RRR; did not appreciate any M/R/G Lungs: CTAB, did not appreciate any wheezes, rales, or rhonchi Abdomen: + BS; soft, ND, NT Vascular: 2+ pedal pulses b/l; 2+ radial pulses b/l Extremities: no peripheral edema, no muscle wasting Neuro: strength and sensation intact in upper and lower extremities b/l    Labs on Admission: I have personally reviewed following labs and imaging studies  CBC: Recent Labs  Lab 06/07/20 1938  WBC 9.1  HGB 15.0  HCT 45.9  MCV 90.0  PLT 323   Basic Metabolic Panel: Recent Labs  Lab 06/07/20 1945  NA 135  K 4.5  CL 103  CO2 23  GLUCOSE 120*  BUN 14  CREATININE 1.68*  CALCIUM 10.9*   GFR: Estimated Creatinine Clearance: 54.5 mL/min (A) (by C-G formula based on SCr of 1.68 mg/dL (H)). Liver Function Tests: Recent Labs  Lab 06/07/20 1945  AST 12*  ALT 11  ALKPHOS 107  BILITOT 1.0  PROT 7.3  ALBUMIN 4.3   Recent Labs  Lab 06/07/20 1945  LIPASE 29   No results for input(s): AMMONIA in the last 168 hours. Coagulation Profile: No results for input(s): INR, PROTIME in the last 168 hours. Cardiac Enzymes: No results for input(s): CKTOTAL, CKMB, CKMBINDEX, TROPONINI in the last 168 hours. BNP (last 3 results) No results for input(s): PROBNP in the last 8760 hours. HbA1C: No results for input(s): HGBA1C in the last 72 hours. CBG: Recent Labs  Lab 06/07/20 1947  GLUCAP 128*   Lipid  Profile: No results for input(s): CHOL, HDL, LDLCALC, TRIG, CHOLHDL, LDLDIRECT in the last 72 hours. Thyroid Function Tests: No results for input(s): TSH, T4TOTAL, FREET4, T3FREE, THYROIDAB in the last 72 hours. Anemia Panel: No results for input(s): VITAMINB12, FOLATE, FERRITIN, TIBC, IRON, RETICCTPCT in the last 72 hours. Urine analysis:    Component Value Date/Time   COLORURINE YELLOW 02/14/2020 2040   APPEARANCEUR CLEAR 02/14/2020 2040   LABSPEC 1.016 02/14/2020 2040   PHURINE 6.0 02/14/2020 2040   GLUCOSEU 50 (A) 02/14/2020 2040   HGBUR NEGATIVE 02/14/2020 2040   BILIRUBINUR NEGATIVE 02/14/2020 2040  KETONESUR NEGATIVE 02/14/2020 2040   PROTEINUR NEGATIVE 02/14/2020 2040   UROBILINOGEN 2.0 (H) 01/11/2013 1615   NITRITE NEGATIVE 02/14/2020 2040   LEUKOCYTESUR NEGATIVE 02/14/2020 2040    Radiological Exams on Admission: CT Head Wo Contrast  Result Date: 06/07/2020 CLINICAL DATA:  Altered level of consciousness EXAM: CT HEAD WITHOUT CONTRAST TECHNIQUE: Contiguous axial images were obtained from the base of the skull through the vertex without intravenous contrast. COMPARISON:  01/28/2020, 01/31/2020 FINDINGS: Brain: No acute infarct or hemorrhage. Lateral ventricles and midline structures are unremarkable. No acute extra-axial fluid collections. No mass effect. Vascular: No hyperdense vessel or unexpected calcification. Skull: Normal. Negative for fracture or focal lesion. Sinuses/Orbits: No acute finding. Other: None. IMPRESSION: 1. No acute intracranial process. Electronically Signed   By: Sharlet Salina M.D.   On: 06/07/2020 21:27     EKG: Independently reviewed, with result as described above.    Assessment/Plan   Rashan Rounsaville is a 45 y.o. male with medical history significant for cardiac arrest in November 2021, anoxic brain injury, type 2 diabetes mellitus with most recent hemoglobin A1c 5.9% when checked in March 2022, chronic diastolic heart failure, hypertension, who  is admitted to [facility] on 06/07/2020 with acute kidney injury after presenting from home to [facility] ED complaining of dizziness.    Principal Problem:   AKI (acute kidney injury) (HCC) Active Problems:   Controlled type 2 diabetes mellitus with hyperglycemia (HCC)   Postural dizziness with presyncope   Hypercalcemia   Hypertension   Chronic diastolic CHF (congestive heart failure) (HCC)    #) Acute Kidney Injury: Relative to baseline serum creatinine ranges 0.8-1.0, with most recent prior value noted to be 0.8 when checked on 05/24/2020, today's labs reflect interval increase in serum creatinine to 1.68.  Suspect that this is prerenal in nature additionally basis of multiple factorial contributions with suspected dehydration given the patient's report of minimal consumption of on a daily basis, and no increase in his water consumption in light recent increase in ambient temperatures.  Additionally, given borderline hypotensive blood pressures at presentation, there may be an additional prerenal contribution from overall mild renal on the basis of hypotension.  No overt additional pharmacologic contribution.  No recent increase in GI losses.  Urinalysis has been ordered, with result currently pending.  Of note, the patient received a 1 L normal saline bolus in the ED.  Will provide additional gentle IV fluids in the setting of a history of chronic diastolic heart failure, as further noted below.   Plan: monitor strict I's & O's and daily weights. Attempt to avoid nephrotoxic agents. Refrain from NSAIDs. Repeat BMP in the morning. Check serum magnesium level. Check random urine sodium and random urine creatinine.  Follow-up result of urinalysis, with attention for the presence of urinary casts.  Lactated Ringer's at 75 cc/h x 10 hours.  Work-up and management of presenting presyncope, as further described below. if renal function does not improve with the above measures, can consider obtaining a  renal US to evaluate for parenchymal abnormality as well as to assess for evidence of post-renal obstructive process.       #) Presyncope: Presyncopal episode that occurred earlier today when the patient was rising from a seated to a standing position in the absence of overt syncope or ensuing fall.  This is postural symptomatology appears consistent with the patient's reported developing intermittent dizziness when rising from a seated to standing position over the last few months following initiation of beta-blocker.  Suspect an element of orthostatic hypotension in the setting of dehydration given clinical appearance of such, exacerbation from diminished compensatory tachycardia in the context beta-blocker use.  Not associate with any overt acute focal neurologic deficits.  Clinically, acute ischemic stroke versus seizures appear less likely at this time, with presenting CT head showed no evidence of acute intracranial process.  Additionally, presentation appears less consistent with ACS, with serial troponin x2 found to be nonelevated, with both values found to be 3, in addition to the patient denying any associated chest pain.  Presenting EKG shows nonspecific T wave changes in aVL and V2, in the absence of any evidence of ST changes, including no evidence of ST elevation.  In the context of a history of cardiac arrest in November 2021, will continue to trend serial troponin and monitor patient overnight on telemetry.  Additionally, will check orthostatic vital signs, but with the caveat that the patient is already received a 1 L IV normal saline bolus in the ED, potentially altering the results of this evaluation.  Overall, the pretraumatic nature of this presentation renders the possibility of contributory ventricular arrhythmia.  Differential also includes orthostatic hypotension on the basis of autonomic dysfunction in the setting of a known history of diabetes.     Plan: I have placed a nursing  communication order requesting that orthostatic vital signs x 1 set be checked and documented, following which will initiate gentle IV fluids in the form of lactated Ringer's at 75 cc/h x 10 hours, as above. Monitor on telemetry. Hold home BB for now. Monitor strict I's and O's.  Check CMP, CBC, serum magnesium level in the morning.  Chest x-ray.  Repeat trop in the morning.  Add on serum magnesium level.     #) Subacute hypercalcemia: Presenting calcium noted to be 10.9, need for adjustment for albumin.  This is similar to most recent prior value of 10.7 when checked on 05/24/2020.  However, prior to the calcium level checked during the last week of March 2022, it appears that the patient's serum calcium levels have been within normal limits.  Suspect a contribution from dehydration rationale.  We will repeat calcium level in the morning following interval IV fluid resuscitation, with consideration for expanding evaluation of should there not be improvement with these rehydration efforts.  Plan: IV fluids, as above.  Ionized calcium in the morning.  Add on serum magnesium level.      #) Chronic diastolic heart failure: Most recent echocardiogram in December 2021, with results as further described above.  No clinical evidence to suggest acute decompensated heart failure at this time.  Not on any diuretic medications at home.  Plan: Monitor strict I's and O's and daily weights.  Add on serum magnesium level.  Repeat BMP in the morning.      #) Type 2 diabetes mellitus: Most recent hemoglobin A1c noted to be 5.9% when checked on 05/24/2020.  On Levemir 25 units subcu daily as an outpatient, in the absence of any short acting insulin.  He is also on Metformin as a sole outpatient oral hypoglycemic agent.  Presenting blood sugar per presenting CMP noted to be 120.  Plan: We will hold Metformin for now, particularly the setting of presenting AKI with associated serum creatinine of 1.68.  Will  initiate basal insulin with initial dose slightly lower than that which she takes on outpatient basis.  Specifically, will start Levemir 15 units subcu daily.  Accu-Cheks before every meal and at bedtime with  low-dose sliding scale insulin.      #) GERD: On Protonix as an outpatient. Plan: Continue home PPI.      #) Chronic tobacco abuse: The patient conveys that he has a current smoker.   Plan: Counseled the patient on the importance of complete smoking discontinuation, particularly the setting of his history of cardiac arrest, as further detailed above.    DVT prophylaxis: scd's  Code Status: Full code Family Communication: The patient's case was discussed with his mother, who is present at bedside Disposition Plan: Per Rounding Team Consults called: none  Admission status: Observation; med telemetry     Of note, this patient was added by me to the following Admit List/Treatment Team:  mcadmits.     PLEASE NOTE THAT DRAGON DICTATION SOFTWARE WAS USED IN THE CONSTRUCTION OF THIS NOTE.   Angie Fava DO [facility] Pager [phone] From 7PM - 7AM   06/08/2020, 1:23 AM

## 2020-06-08 NOTE — Discharge Instructions (Signed)
Dehydration, Adult Dehydration is a condition in which there is not enough water or other fluids in the body. This happens when a person loses more fluids than he or she takes in. Important organs, such as the kidneys, brain, and heart, cannot function without a proper amount of fluids. Any loss of fluids from the body can lead to dehydration. Dehydration can be mild, moderate, or severe. It should be treated right away to prevent it from becoming severe. What are the causes? Dehydration may be caused by:  Conditions that cause loss of water or other fluids, such as diarrhea, vomiting, or sweating or urinating a lot.  Not drinking enough fluids, especially when you are ill or doing activities that require a lot of energy.  Other illnesses and conditions, such as fever or infection.  Certain medicines, such as medicines that remove excess fluid from the body (diuretics).  Lack of safe drinking water.  Not being able to get enough water and food. What increases the risk? The following factors may make you more likely to develop this condition:  Having a long-term (chronic) illness that has not been treated properly, such as diabetes, heart disease, or kidney disease.  Being 65 years of age or older.  Having a disability.  Living in a place that is high in altitude, where thinner, drier air causes more fluid loss.  Doing exercises that put stress on your body for a long time (endurance sports). What are the signs or symptoms? Symptoms of dehydration depend on how severe it is. Mild or moderate dehydration  Thirst.  Dry lips or dry mouth.  Dizziness or light-headedness, especially when standing up from a seated position.  Muscle cramps.  Dark urine. Urine may be the color of tea.  Less urine or tears produced than usual.  Headache. Severe dehydration  Changes in skin. Your skin may be cold and clammy, blotchy, or pale. Your skin also may not return to normal after being  lightly pinched and released.  Little or no tears, urine, or sweat.  Changes in vital signs, such as rapid breathing and low blood pressure. Your pulse may be weak or may be faster than 100 beats a minute when you are sitting still.  Other changes, such as: ? Feeling very thirsty. ? Sunken eyes. ? Cold hands and feet. ? Confusion. ? Being very tired (lethargic) or having trouble waking from sleep. ? Short-term weight loss. ? Loss of consciousness. How is this diagnosed? This condition is diagnosed based on your symptoms and a physical exam. You may have blood and urine tests to help confirm the diagnosis. How is this treated? Treatment for this condition depends on how severe it is. Treatment should be started right away. Do not wait until dehydration becomes severe. Severe dehydration is an emergency and needs to be treated in a hospital.  Mild or moderate dehydration can be treated at home. You may be asked to: ? Drink more fluids. ? Drink an oral rehydration solution (ORS). This drink helps restore proper amounts of fluids and salts and minerals in the blood (electrolytes).  Severe dehydration can be treated: ? With IV fluids. ? By correcting abnormal levels of electrolytes. This is often done by giving electrolytes through a tube that is passed through your nose and into your stomach (nasogastric tube, or NG tube). ? By treating the underlying cause of dehydration. Follow these instructions at home: Oral rehydration solution If told by your health care provider, drink an ORS:  Make   an ORS by following instructions on the package.  Start by drinking small amounts, about  cup (120 mL) every 5-10 minutes.  Slowly increase how much you drink until you have taken the amount recommended by your health care provider. Eating and drinking  Drink enough clear fluid to keep your urine pale yellow. If you were told to drink an ORS, finish the ORS first and then start slowly drinking  other clear fluids. Drink fluids such as: ? Water. Do not drink only water. Doing that can lead to hyponatremia, which is having too little salt (sodium) in the body. ? Water from ice chips you suck on. ? Fruit juice that you have added water to (diluted fruit juice). ? Low-calorie sports drinks.  Eat foods that contain a healthy balance of electrolytes, such as bananas, oranges, potatoes, tomatoes, and spinach.  Do not drink alcohol.  Avoid the following: ? Drinks that contain a lot of sugar. These include high-calorie sports drinks, fruit juice that is not diluted, and soda. ? Caffeine. ? Foods that are greasy or contain a lot of fat or sugar.         General instructions  Take over-the-counter and prescription medicines only as told by your health care provider.  Do not take sodium tablets. Doing that can lead to having too much sodium in the body (hypernatremia).  Return to your normal activities as told by your health care provider. Ask your health care provider what activities are safe for you.  Keep all follow-up visits as told by your health care provider. This is important. Contact a health care provider if:  You have muscle cramps, pain, or discomfort, such as: ? Pain in your abdomen and the pain gets worse or stays in one area (localizes). ? Stiff neck.  You have a rash.  You are more irritable than usual.  You are sleepier or have a harder time waking than usual.  You feel weak or dizzy.  You feel very thirsty. Get help right away if you have:  Any symptoms of severe dehydration.  Symptoms of vomiting, such as: ? You cannot eat or drink without vomiting. ? Vomiting gets worse or does not go away. ? Vomit includes blood or green matter (bile).  Symptoms that get worse with treatment.  A fever.  A severe headache.  Problems with urination or bowel movements, such as: ? Diarrhea that gets worse or does not go away. ? Blood in your stool (feces).  This may cause stool to look black and tarry. ? Not urinating, or urinating only a small amount of very dark urine, within 6-8 hours.  Trouble breathing. These symptoms may represent a serious problem that is an emergency. Do not wait to see if the symptoms will go away. Get medical help right away. Call your local emergency services (911 in the [place] Do not drive yourself to the hospital. Summary  Dehydration is a condition in which there is not enough water or other fluids in the body. This happens when a person loses more fluids than he or she takes in.  Treatment for this condition depends on how severe it is. Treatment should be started right away. Do not wait until dehydration becomes severe.  Drink enough clear fluid to keep your urine pale yellow. If you were told to drink an oral rehydration solution (ORS), finish the ORS first and then start slowly drinking other clear fluids.  Take over-the-counter and prescription medicines only as told by your health   care provider.  Get help right away if you have any symptoms of severe dehydration. This information is not intended to replace advice given to you by your health care provider. Make sure you discuss any questions you have with your health care provider. Document Revised: 09/25/2018 Document Reviewed: 09/25/2018 Elsevier Patient Education  2021 Elsevier Inc.  

## 2020-06-08 NOTE — Discharge Summary (Signed)
Physician Discharge Summary  Kastin Cerda YQM:[id number] DOB: 09-Mar-1975 DOA: 06/07/2020  PCP: Fleet Contras, MD  Admit date: 06/07/2020 Discharge date: 06/08/2020    Admitted From: Home Disposition: Home  Recommendations for Outpatient Follow-up:  1. Follow up with PCP in 1-2 weeks 2. Please obtain BMP/CBC in one week 3. Please follow up with your PCP on the following pending results: Unresulted Labs (From admission, onward)          Start     Ordered   06/08/20 0500  Calcium, ionized  Tomorrow morning,   R       Question:  Specimen collection method  Answer:  Lab=Lab collect   06/08/20 0151   06/08/20 0148  Sodium, urine, random  Add-on,   AD        06/08/20 0147   06/08/20 0148  Creatinine, urine, random  Add-on,   AD        06/08/20 0147   06/07/20 1932  Urinalysis, Routine w reflex microscopic Urine, Clean Catch  Once,   STAT        06/07/20 1932            Home Health: None Equipment/Devices: None  Discharge Condition: Stable CODE STATUS: Full code Diet recommendation: Regular  Subjective: Seen and examined in the ED.  Mother at the bedside.  Patient fully alert and oriented.  Mother also verified that patient is back to baseline.  Following H&P and emergency department course copy pasted from admitter HPI: Brentley Landfair is a 45 y.o. male with medical history significant for cardiac arrest in November 2021, anoxic brain injury, type 2 diabetes mellitus with most recent hemoglobin A1c 5.9% when checked in March 2022, chronic diastolic heart failure, hypertension, who is admitted to [facility] on 06/07/2020 with acute kidney injury after presenting from home to [facility] ED complaining of dizziness.   The patient reports that he has been experiencing intermittent dizziness over the last few months following discharge from the hospital after experiencing cardiac arrest during November 2021 with prolonged intubation and subsequently tracheostomy.  Prior to  discharge from the hospital he was started on Lopressor 50 mg p.o. twice daily, upon which he reports good ensuing compliance.  On 01/27/2020 he underwent echocardiogram which showed left ventricular hyperdynamic function with LVEF 70 to 75%, grade 1 diastolic dysfunction, no evidence of significant valvular pathology.  This echocardiogram was associated with suboptimal view of the ventricular wall to evaluate for the presence of focal wall motion abnormalities.   Over the last few months following his cardiac arrest, the patient reports that he has intermittently experienced dizziness and lightheadedness upon standing from a seated position.  He has never felt as if he was going to pass out as a result of this dizziness until he experienced a presyncopal event earlier today, prompting him to present to [facility] emergency department for further evaluation.  Earlier this afternoon, the patient reports that he attempted to stand from a seated position, at which time a became dizzy, started seeing dark spots, and overall had the sensation of he may lose consciousness.  However, he confirms that he did not lose consciousness, and that the sensation resolved within approximately 10 seconds of onset.  He notes associated palpitations that were limited to the 5 to 10 seconds during which she was experiencing the dizziness. he denies any associated chest pain, shortness of breath, diaphoresis.  Denies any associated vertigo.  No recent melena or hematochezia.  No recent nausea, vomiting, or  diarrhea.  He acknowledges that he typically consumes very little water on a given day, and that he has not adjusted his water consumption recent increase in ambient temperatures. Denies any recent trauma.  No generalized tonic-clonic activity, tongue biting, or loss of bowel/bladder function.  No recent headaches, acute focal weakness, acute focal paresthesias, numbness, slurred speech, facial droop, or dysphagia.  Denies any recent  peripheral edema, calf tenderness, lower extremity erythema, or hemoptysis.  Aside from his Lopressor, the patient denies use of any additional antihypertensive medications at home.  He specifically denies any overt syncopal events over the last few months, and he he has not fallen as a result of any of these episodes involving dizziness..   Denies any recent subjective fever, chills, rigors, or generalized myalgias.  No recent neck stiffness, rhinitis, rhinorrhea, sore throat, wheezing, cough, abdominal pain, or rash.  No recent known COVID-19 exposures.  Per chart review, the patient has history of type 2 diabetes mellitus, with most recent hemoglobin A1c noted to be 5.9% on 05/24/2020.  He is on Levemir 25 units subcu daily as an outpatient, in the absence of any short acting insulin.  He is also on Metformin at home, in the absence of any additional oral hypoglycemic agents, including the use of sulfonylureas.    ED Course:  Vital signs in the ED were notable for the following: Temperature max 98.4, heart rate 63-90; blood pressure 91/73 -109/72; respiratory rate 14-15; oxygen saturation 97 to 98% on room air.  Labs were notable for the following: CMP was notable for the following: Sodium 135, potassium 4.5, bicarbonate 23, BUN 14, creatinine 1.68 relative to baseline creatinine range of 0.8-1.0 as well as most recent prior value of 0.8 on 05/15/2020, glucose 120, calcium 10.9 relative to most recent prior value 10.7 on 05/24/2020, albumin 4.3.  High-sensitivity troponin I x2 have been unchanged at 3.  Urinalysis was ordered, with result currently pending.  CBC notable for white blood cell count of 9100 and hemoglobin 15.  COVID-19/influenza PCR were ordered in the ED, with result currently pending.  EKG, by way of comparison to most recent prior from 03/18/2020, showed normal sinus rhythm with a rate 66, normal intervals, nonspecific T wave flattening in aVL, nonspecific T wave inversion in  V2, and no evidence of ST changes, including no evidence of ST elevation.  Noncontrast CT of the head showed no evidence of acute intracranial process.  While in the ED, the following were administered: Normal saline x1 L bolus.  Subsequently, the patient was admitted for overnight observation to the med telemetry floor for further evaluation management of presenting acute kidney injury.  Brief/Interim Summary: Basically patient presented to the ED dizziness, hypotension and mild AKI and it sounds like all the symptoms and his presentation is stemmed from dehydration based off of the history provided by the mother where she tells [place] that patient does not drink water enough and instead drinks other sugary drinks.  Patient was hydrated overnight.  Patient's blood pressure improved.  AKI resolved.  Clinically looks hydrated now.  And his symptoms have improved.  His mother who is at the bedside also verified that patient is back to baseline and she agrees with the plan of discharge.  I personally highly encouraged the patient to drink plenty of fluids, at least 2 L a day.  Avoid other source of drinks to keep himself hydrated.  His mother would also ensure that he does that.  Discharge Diagnoses:  Principal Problem:  AKI (acute kidney injury) (HCC) Active Problems:   Controlled type 2 diabetes mellitus with hyperglycemia (HCC)   Postural dizziness with presyncope   Hypercalcemia   Hypertension   Chronic diastolic CHF (congestive heart failure) (HCC)    Discharge Instructions   Allergies as of 06/08/2020      Reactions   Fluphenazine Shortness Of Breath, Swelling   Tongue swelling, and shaking   Coffee Bean Extract [coffea Arabica] Swelling   Vancomycin Rash   Bullous rash over trunk, arms, and face       Medication List    TAKE these medications   acetaminophen 500 MG tablet Commonly known as: TYLENOL Take 1 tablet (500 mg total) by mouth every 6 (six) hours.   Aum Mini Pen Needles  33G X 5 MM Misc Generic drug: Insulin Pen Needle 1 application by Does not apply route daily.   docusate sodium 100 MG capsule Commonly known as: COLACE Take 1 capsule (100 mg total) by mouth 2 (two) times daily.   FLUoxetine 20 MG capsule Commonly known as: PROZAC Take 1 capsule (20 mg total) by mouth daily.   insulin detemir 100 UNIT/ML FlexPen Commonly known as: LEVEMIR Inject 25 Units into the skin daily.   metFORMIN 500 MG 24 hr tablet Commonly known as: GLUCOPHAGE-XR Take 1 tablet (500 mg total) by mouth 2 (two) times daily with a meal.   metoprolol tartrate 50 MG tablet Commonly known as: LOPRESSOR Take 1 tablet (50 mg total) by mouth 2 (two) times daily.   multivitamin with minerals Tabs tablet Take 1 tablet by mouth daily.   pantoprazole 40 MG tablet Commonly known as: PROTONIX Take 1 tablet (40 mg total) by mouth daily.   QUEtiapine 50 MG tablet Commonly known as: SEROQUEL Take 1 tablet (50 mg total) by mouth every morning.       Follow-up Information    Fleet ContrasAvbuere, Edwin, MD Follow up in 1 week(s).   Specialty: Internal Medicine Contact information: [address] [place] [id number] [phone]              Allergies  Allergen Reactions  . Fluphenazine Shortness Of Breath and Swelling    Tongue swelling, and shaking  . Coffee Bean Extract [Coffea Arabica] Swelling  . Vancomycin Rash    Bullous rash over trunk, arms, and face     Consultations: None   Procedures/Studies: CT Head Wo Contrast  Result Date: 06/07/2020 CLINICAL DATA:  Altered level of consciousness EXAM: CT HEAD WITHOUT CONTRAST TECHNIQUE: Contiguous axial images were obtained from the base of the skull through the vertex without intravenous contrast. COMPARISON:  01/28/2020, 01/31/2020 FINDINGS: Brain: No acute infarct or hemorrhage. Lateral ventricles and midline structures are unremarkable. No acute extra-axial fluid collections. No mass effect. Vascular: No hyperdense  vessel or unexpected calcification. Skull: Normal. Negative for fracture or focal lesion. Sinuses/Orbits: No acute finding. Other: None. IMPRESSION: 1. No acute intracranial process. Electronically Signed   By: Sharlet SalinaMichael  Brown M.D.   On: 06/07/2020 21:27   DG CHEST PORT 1 VIEW  Result Date: 06/08/2020 CLINICAL DATA:  Postural dizziness. EXAM: PORTABLE CHEST 1 VIEW COMPARISON:  March 02, 2020 FINDINGS: Decreased lung volumes are seen which is likely secondary to the degree of patient inspiration. Mild linear scarring and/or atelectasis is seen within the left lung base. There is no evidence of an acute infiltrate, pleural effusion or pneumothorax. The heart size and mediastinal contours are within normal limits. The visualized skeletal structures are unremarkable. IMPRESSION: Mild left basilar  linear scarring and/or atelectasis. Electronically Signed   By: Aram Candela M.D.   On: 06/08/2020 02:21      Discharge Exam: Vitals:   06/08/20 0645 06/08/20 0700  BP: 123/82 115/83  Pulse: 63 60  Resp: 11 11  Temp:    SpO2: 97% 97%   Vitals:   06/08/20 0430 06/08/20 0529 06/08/20 0645 06/08/20 0700  BP: 114/82  123/82 115/83  Pulse: 65  63 60  Resp: Temp:  98 F (36.7 C)    TempSrc:  Oral    SpO2: 97%  97% 97%  Weight:      Height:        General: Pt is alert, awake, not in acute distress Cardiovascular: RRR, S1/S2 +, no rubs, no gallops Respiratory: CTA bilaterally, no wheezing, no rhonchi Abdominal: Soft, NT, ND, bowel sounds + Extremities: no edema, no cyanosis    The results of significant diagnostics from this hospitalization (including imaging, microbiology, ancillary and laboratory) are listed below for reference.     Microbiology: Recent Results (from the past 240 hour(s))  SARS CORONAVIRUS 2 (TAT 6-24 HRS) Nasopharyngeal Nasopharyngeal Swab     Status: None   Collection Time: 06/07/20 10:30 PM   Specimen: Nasopharyngeal Swab  Result Value Ref Range Status    SARS Coronavirus 2 NEGATIVE NEGATIVE Final    Comment: (NOTE) SARS-CoV-2 target nucleic acids are NOT DETECTED.  The SARS-CoV-2 RNA is generally detectable in upper and lower respiratory specimens during the acute phase of infection. Negative results do not preclude SARS-CoV-2 infection, do not rule out co-infections with other pathogens, and should not be used as the sole basis for treatment or other patient management decisions. Negative results must be combined with clinical observations, patient history, and epidemiological information. The expected result is Negative.  Fact Sheet for Patients: HairSlick.no  Fact Sheet for Healthcare Providers: quierodirigir.com  This test is not yet approved or cleared by the [place] FDA and  has been authorized for detection and/or diagnosis of SARS-CoV-2 by FDA under an Emergency Use Authorization (EUA). This EUA will remain  in effect (meaning this test can be used) for the duration of the COVID-19 declaration under Se ction 564(b)(1) of the Act, 21 U.S.C. section 360bbb-3(b)(1), unless the authorization is terminated or revoked sooner.  Performed at [facility], [address], [place], [place] [address]      Labs: BNP (last 3 results) No results for input(s): BNP in the last 8760 hours. Basic Metabolic Panel: Recent Labs  Lab 06/07/20 1945 06/08/20 0321  NA 135 135  K 4.5 3.9  CL 103 105  CO2 23 24  GLUCOSE 120* 118*  BUN 14 12  CREATININE 1.68* 1.07  CALCIUM 10.9* 10.1  MG  --  1.8  PHOS  --  3.4   Liver Function Tests: Recent Labs  Lab 06/07/20 1945 06/08/20 0321  AST 12* 11*  ALT 11 10  ALKPHOS 107 90  BILITOT 1.0 0.9  PROT 7.3 5.9*  ALBUMIN 4.3 3.5   Recent Labs  Lab 06/07/20 1945  LIPASE 29   No results for input(s): AMMONIA in the last 168 hours. CBC: Recent Labs  Lab 06/07/20 1938 06/08/20 0321  WBC 9.1 7.7  HGB 15.0 13.0   HCT 45.9 39.8  MCV 90.0 88.6  PLT 323 245   Cardiac Enzymes: No results for input(s): CKTOTAL, CKMB, CKMBINDEX, TROPONINI in the last 168 hours. BNP: Invalid input(s): POCBNP CBG: Recent Labs  Lab 06/07/20 1947 06/08/20 0837  GLUCAP 128* 123*   D-Dimer No results for input(s): DDIMER in the last 72 hours. Hgb A1c No results for input(s): HGBA1C in the last 72 hours. Lipid Profile No results for input(s): CHOL, HDL, LDLCALC, TRIG, CHOLHDL, LDLDIRECT in the last 72 hours. Thyroid function studies No results for input(s): TSH, T4TOTAL, T3FREE, THYROIDAB in the last 72 hours.  Invalid input(s): FREET3 Anemia work up No results for input(s): VITAMINB12, FOLATE, FERRITIN, TIBC, IRON, RETICCTPCT in the last 72 hours. Urinalysis    Component Value Date/Time   COLORURINE YELLOW 02/14/2020 2040   APPEARANCEUR CLEAR 02/14/2020 2040   LABSPEC 1.016 02/14/2020 2040   PHURINE 6.0 02/14/2020 2040   GLUCOSEU 50 (A) 02/14/2020 2040   HGBUR NEGATIVE 02/14/2020 2040   BILIRUBINUR NEGATIVE 02/14/2020 2040   KETONESUR NEGATIVE 02/14/2020 2040   PROTEINUR NEGATIVE 02/14/2020 2040   UROBILINOGEN 2.0 (H) 01/11/2013 1615   NITRITE NEGATIVE 02/14/2020 2040   LEUKOCYTESUR NEGATIVE 02/14/2020 2040   Sepsis Labs Invalid input(s): PROCALCITONIN,  WBC,  LACTICIDVEN Microbiology Recent Results (from the past 240 hour(s))  SARS CORONAVIRUS 2 (TAT 6-24 HRS) Nasopharyngeal Nasopharyngeal Swab     Status: None   Collection Time: 06/07/20 10:30 PM   Specimen: Nasopharyngeal Swab  Result Value Ref Range Status   SARS Coronavirus 2 NEGATIVE NEGATIVE Final    Comment: (NOTE) SARS-CoV-2 target nucleic acids are NOT DETECTED.  The SARS-CoV-2 RNA is generally detectable in upper and lower respiratory specimens during the acute phase of infection. Negative results do not preclude SARS-CoV-2 infection, do not rule out co-infections with other pathogens, and should not be used as the sole basis for  treatment or other patient management decisions. Negative results must be combined with clinical observations, patient history, and epidemiological information. The expected result is Negative.  Fact Sheet for Patients: HairSlick.no  Fact Sheet for Healthcare Providers: quierodirigir.com  This test is not yet approved or cleared by the [place] FDA and  has been authorized for detection and/or diagnosis of SARS-CoV-2 by FDA under an Emergency Use Authorization (EUA). This EUA will remain  in effect (meaning this test can be used) for the duration of the COVID-19 declaration under Se ction 564(b)(1) of the Act, 21 U.S.C. section 360bbb-3(b)(1), unless the authorization is terminated or revoked sooner.  Performed at [facility], [address], [place], [place] [address]      Time coordinating discharge: Over 30 minutes  SIGNED:   Hughie Closs, MD  [facility] 06/08/2020, 8:52 AM  If 7PM-7AM, please contact night-coverage [email or web address]

## 2020-06-10 LAB — CALCIUM, IONIZED: Calcium, Ionized, Serum: 5.7 mg/dL — ABNORMAL HIGH (ref 4.5–5.6)

## 2020-07-12 ENCOUNTER — Ambulatory Visit: Payer: Medicaid Other | Attending: Internal Medicine | Admitting: Occupational Therapy

## 2020-07-12 ENCOUNTER — Ambulatory Visit: Payer: Medicaid Other | Admitting: Physical Therapy

## 2020-07-12 DIAGNOSIS — R41840 Attention and concentration deficit: Secondary | ICD-10-CM | POA: Diagnosis present

## 2020-07-12 DIAGNOSIS — M25612 Stiffness of left shoulder, not elsewhere classified: Secondary | ICD-10-CM | POA: Insufficient documentation

## 2020-07-12 DIAGNOSIS — R278 Other lack of coordination: Secondary | ICD-10-CM

## 2020-07-12 DIAGNOSIS — R2681 Unsteadiness on feet: Secondary | ICD-10-CM

## 2020-07-12 DIAGNOSIS — R2689 Other abnormalities of gait and mobility: Secondary | ICD-10-CM | POA: Diagnosis present

## 2020-07-12 DIAGNOSIS — M6281 Muscle weakness (generalized): Secondary | ICD-10-CM

## 2020-07-12 DIAGNOSIS — M25611 Stiffness of right shoulder, not elsewhere classified: Secondary | ICD-10-CM | POA: Diagnosis present

## 2020-07-12 NOTE — Therapy (Signed)
[facility] [address] [place], [place], [address] Phone: [phone]   Fax:  [phone]  Physical Therapy Evaluation  Patient Details  Name: Bryan [name] MRN: [id number] Date of Birth: [birth year] Referring Provider (PT): [name]   Encounter Date: 07/12/2020   PT End of Session - 07/12/20 1728    Visit Number 1    Number of Visits 13    Authorization Type Medicaid-auth requested upon completion of eval    PT Start Time 1321    PT Stop Time 1403    PT Time Calculation (min) 42 min    Equipment Utilized During Treatment Gait belt    Activity Tolerance Patient tolerated treatment well    Behavior During Therapy Flat affect;WFL for tasks assessed/performed           Past Medical History:  Diagnosis Date  . Controlled type 2 diabetes mellitus with hyperglycemia (HCC)   . Hypertension   . Psychiatric problem    [facility]. Takes seroquel. History of hearing voices.  Not commandivng voices.   . Schizophrenia (HCC)   . Tremors of nervous system     Past Surgical History:  Procedure Laterality Date  . none    . TRACHEOSTOMY TUBE PLACEMENT N/A 02/13/2020   Procedure: TRACHEOSTOMY;  Surgeon: Newman Pieseoh, Su, MD;  [facility];  Service: ENT;  Laterality: N/A;    There were no vitals filed for this visit.    Subjective Assessment - 07/12/20 1328    Subjective Having trouble with balance.  Has a walker and uses it most of the time.  Was independent prior to hospitalization in Nov 2021 with anoxic brain injury.  Left side is the weaker side.    Patient is accompained by: Family member   Mother, brother; mom provides most of the history   Pertinent History Male with history of HTN, schizophrenia who was admitted 01/25/2020 with hypotension, lethargy and found to have newly diagnosed DM with DKA and AKI and anoxic encephalopathy; was admitted to CIR and d/c home 2/11/ 2022; ED visit 05/2020     Limitations Standing;Walking    Patient Stated Goals Wants to work on balance, strength, gait    Currently in Pain? No/denies              [facility] PT Assessment - 07/12/20 1332      Assessment   Medical Diagnosis anoxic brain injury    Referring Provider (PT) [name]    Onset Date/Surgical Date 01/25/20    Hand Dominance Right    Prior Therapy Inpatient rehab      Precautions   Precautions Fall      Balance Screen   Has the patient fallen in the past 6 months No    Has the patient had a decrease in activity level because of a fear of falling?  Yes    Is the patient reluctant to leave their home because of a fear of falling?  No      Home Nurse, mental healthnvironment   Living Environment Private residence    Living Arrangements Parent    Available Help at Discharge Family    Type of Home House    Home Access Stairs to enter    Entrance Stairs-Number of Steps 2    Entrance Stairs-Rails Right;Left;Cannot reach both    Home Layout One level    Home Equipment Walker - 2 wheels      Prior Function   Level of Independence Independent  prior to Nov 2021   Vocation On disability   due to schizophrenia   Leisure sports      Cognition   Behaviors Other (comment)   Appears lethargic towards end of PT eval     Observation/Other Assessments   Focus on Therapeutic Outcomes (FOTO)  NA      Sensation   Light Touch Appears Intact      Posture/Postural Control   Posture/Postural Control Postural limitations    Postural Limitations Rounded Shoulders;Posterior pelvic tilt      Tone   Assessment Location Right Lower Extremity;Left Lower Extremity      ROM / Strength   AROM / PROM / Strength AROM;Strength      AROM   Overall AROM  Deficits    Overall AROM Comments BLEs WFL except decreased AROM L ankle dorsiflexion to approx neutral      Strength   Overall Strength Deficits    Strength Assessment Site Knee;Hip;Ankle    Right/Left Hip Right;Left    Right Hip Flexion 4/5    Left Hip  Flexion 4/5    Right/Left Knee Right;Left    Right Knee Flexion 4/5    Right Knee Extension 5/5    Left Knee Flexion 4/5    Left Knee Extension 4+/5    Right/Left Ankle Right;Left    Right Ankle Dorsiflexion 4/5    Left Ankle Dorsiflexion 3+/5      Transfers   Transfers Sit to Stand;Stand to Sit    Sit to Stand 6: Modified independent (Device/Increase time);With upper extremity assist;From chair/3-in-1    Stand to Sit 5: Supervision;With upper extremity assist;To chair/3-in-1      Ambulation/Gait   Ambulation/Gait Yes    Ambulation/Gait Assistance 5: Supervision    Ambulation Distance (Feet) 80 Feet    Assistive device Rolling walker    Gait Pattern Step-through pattern;Decreased step length - right;Decreased step length - left;Ataxic;Decreased hip/knee flexion - left;Decreased hip/knee flexion - right;Decreased dorsiflexion - left;Wide base of support    Ambulation Surface Level;Indoor    Gait velocity 34 sec = 0.96 ft/sec    Stairs Yes    Stairs Assistance 5: Supervision    Stairs Assistance Details (indicate cue type and reason) uses Bilat rails in PT session; mom reports he has one rail and she provides supervision at home    Stair Management Technique Two rails;Alternating pattern;Forwards    Number of Stairs 4    Height of Stairs 6      Standardized Balance Assessment   Standardized Balance Assessment Timed Up and Go Test      Timed Up and Go Test   Normal TUG (seconds) 72.91    TUG Comments Scores >13.5 sec indicate increased fall risk; scores >30 sec indicate increased difficulty with ADLs in the home      RLE Tone   RLE Tone Mild      LLE Tone   LLE Tone Moderate                      Objective measurements completed on examination: See above findings.               PT Education - 07/12/20 1728    Education Details PT POC and eval results    Person(s) Educated Patient;Parent(s);Other (comment)   mother and brother   Methods Explanation     Comprehension Verbalized understanding            PT Short Term Goals -  07/12/20 1737      PT SHORT TERM GOAL #1   Title Pt will perform HEP with family supervision for improved strength, balance, transfers, and gait.  TARGET 08/19/2020 (delayed one week due to [facility] scheduling)    Baseline no current HEP    Time 4    Period Weeks    Status New      PT SHORT TERM GOAL #2   Title Pt will perform sit<>stand 5 of 5 trials, mod independently and no LOB upon standing to demonstrate improved functional strength and transfer efficiency.    Baseline Supervision and ataxic sway upon standing.    Time 4    Period Weeks    Status New      PT SHORT TERM GOAL #3   Title Berg Balance score to be assessed and LTG to be written to address balance/decreased fall risk.    Baseline Pt has heavy reliance on RW and maintains UE support upon standing; Berg not completed at eval due to time constraints    Time 4    Period Weeks    Status New      PT SHORT TERM GOAL #4   Title Pt will improve TUG score to less than or qual to 55 seconds for decreased fall risk.    Baseline 72.91 sec    Time 4    Period Weeks    Status New      PT SHORT TERM GOAL #5   Title Pt/family will verbalize understanding of fall prevention in home environment.    Baseline Pt at fall risk per TUG and gait velocity scores    Time 4    Period Weeks    Status New             PT Long Term Goals - 07/12/20 1742      PT LONG TERM GOAL #1   Title Pt will perform progression of HEP with family supervision for improved strength, balance, transfers, and gait.  TARGET 09/23/2020    Baseline no current HEP    Time 9    Period Weeks    Status New      PT LONG TERM GOAL #2   Title Pt will improve gait velocity to at least 1.3 ft/sec for improved gait efficiency and safety.    Baseline 0.96 ft/sec with RW    Time 9    Period Weeks    Status New      PT LONG TERM GOAL #3   Title Pt will improve Berg score by at  least 10 points from baseline to decrease fall risk.    Baseline Pt at risk for falls per TUG and gait velocity, heavy reliance on RW in standing; Berg not completed at eval due to time constraints    Time 9    Period Weeks      PT LONG TERM GOAL #4   Title Pt will improve TUG score to less than or equal to 45 sec for decreased fall risk.    Baseline 72.91 RW at eval    Time 9    Period Weeks    Status New      PT LONG TERM GOAL #5   Title Pt will ambulate at least 50-100 ft, short household distances, with least restrictive assistive device and supervision, for improved independence with gait.    Baseline RW 80 ft at eval with supervision    Time 9    Period Weeks  Status New                  Plan - 07/12/20 1729    Clinical Impression Statement Pt is a 45 year old male with history of HTN, schizophrenia who was admitted 01/25/2020 with hypotension, lethargy and found to have newly diagnosed DM with DKA and AKI and was hospitalized for anoxic encephalopathy.  He participated in rehab in CIR and was discharged home 04/08/2020.  He presents to OPPT with decreased strength, abnormal tone, decreased flexibility, decreased balance, abnormal gait, decreased independent gait.  Prior to hospital admission, he was independent and active.  He is currently using a RW with supervision and he is at fall risk per TUG and gait velocity scores.   He would benefit from skilled PT to address the above stated deficits to decrease fall risk and improve overall functional mobility.    Personal Factors and Comorbidities Comorbidity 3+    Comorbidities HTN, DM, schizophrenia    Examination-Activity Limitations Locomotion Level;Transfers;Stairs;Stand    Examination-Participation Restrictions Community Activity;Other   sports   Stability/Clinical Decision Making Evolving/Moderate complexity    Clinical Decision Making Moderate    Rehab Potential Good    PT Frequency Other (comment)   2x/wk for 3  weeks, then 1x/wk for 6 weeks   PT Duration Other (comment)   9 wk POC   PT Treatment/Interventions ADLs/Self Care Home Management;Gait training;Stair training;Functional mobility training;Therapeutic activities;Therapeutic exercise;Balance training;Neuromuscular re-education;DME Instruction;Electrical Stimulation;Manual techniques;Orthotic Fit/Training;Patient/family education    PT Next Visit Plan Quickly assess Berg; initiate HEP for lower extremity strength (weightbearing to break up extensor tone-sit<>stand, squats?quadruped), standing balance exercises; work on aerobic machines (maybe at end/after session; work in parallel bars and progress to gait with lesser device as safely appropriate    Consulted and Agree with Plan of Care Patient;Family member/caregiver    Family Member Consulted Mom and brother           Patient will benefit from skilled therapeutic intervention in order to improve the following deficits and impairments:  Abnormal gait,Difficulty walking,Decreased balance,Impaired flexibility,Decreased mobility,Decreased strength,Impaired tone  Visit Diagnosis: Unsteadiness on feet  Other abnormalities of gait and mobility  Muscle weakness (generalized)     Problem List Patient Active Problem List   Diagnosis Date Noted  . Postural dizziness with presyncope 06/08/2020  . Hypercalcemia 06/08/2020  . Hypertension   . Chronic diastolic CHF (congestive heart failure) (HCC)   . Abnormality of gait 05/19/2020  . Visual disturbance   . Acute blood loss anemia   . Schizophrenia (HCC)   . Elevated triglycerides with high cholesterol   . Controlled type 2 diabetes mellitus with hyperglycemia (HCC)   . 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  . Pressure injury of skin 02/16/2020  . Toxic metabolic encephalopathy   . AKI (acute kidney injury) (HCC)   . Dehydration   . Pancreatitis   . Respiratory failure (HCC)   . DKA (diabetic ketoacidosis) (HCC) 01/25/2020  . MDD (major  depressive disorder), recurrent, severe, 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  . Paranoid schizophrenia (HCC)   . Undifferentiated schizophrenia (HCC)   . 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 Illiterate 10/30/2011  . Schizoaffective disorder, depressive type (HCC) 10/27/2011  . Schizoaffective disorder (HCC) 08/10/2011  . Observed seizure-like activity (HCC) 08/09/2011  . PAIN IN JOINT, MULTIPLE SITES 02/16/2010  . FATIGUE 02/16/2010  . Shortness of breath 02/16/2010    Shadeed Colberg W. 07/12/2020, 5:50 PM  Lonia Blood, PT 07/12/20 5:51 PM Phone: [phone] Fax: [phone]  [facility] [address] [place], [place], [address] Phone: [phone]   Fax:  [phone]  Name: Maddux Vanscyoc MRN: [id number] Date of Birth: 05/15/1975

## 2020-07-12 NOTE — Therapy (Signed)
[facility] [address] [place], [place], [address] Phone: [phone]   Fax:  [phone]  Occupational Therapy Evaluation  Patient Details  Name: Bryan [name] MRN: [id number] Date of Birth: 10/27/1975 Referring Provider (OT): Dr. Concepcion Elk   Encounter Date: 07/12/2020   OT End of Session - 07/12/20 1336    Visit Number 1    Number of Visits 15    Date for OT Re-Evaluation 09/20/20    Authorization Type MCD - Awaiting authorization    OT Start Time 1232    OT Stop Time 1318    OT Time Calculation (min) 46 min    Activity Tolerance Patient tolerated treatment well    Behavior During Therapy [facility] for tasks assessed/performed           Past Medical History:  Diagnosis Date  . Controlled type 2 diabetes mellitus with hyperglycemia (HCC)   . Hypertension   . Psychiatric problem    Seen at [facility] for unknown reason. Takes seroquel. History of hearing voices.  Not commandivng voices.   . Schizophrenia (HCC)   . Tremors of nervous system     Past Surgical History:  Procedure Laterality Date  . none    . TRACHEOSTOMY TUBE PLACEMENT N/A 02/13/2020   Procedure: TRACHEOSTOMY;  Surgeon: Newman Pies, MD;  [facility]: [facility] OR;  Service: ENT;  Laterality: N/A;    There were no vitals filed for this visit.   Subjective Assessment - 07/12/20 1243    Patient is accompanied by: Family member   Mother and brother   Pertinent History Went to ED 01/25/20 with DKA and AKI w/ newly diagnosed DM. Pt developed metabolic encephalopathy and anoxic brain damage as a result. PMH: schizoaffective disorder, HTN    Limitations fall risk,    Currently in Pain? No/denies             [facility] OT Assessment - 07/12/20 0001      Assessment   Medical Diagnosis anoxic brain injury    Referring Provider (OT) Dr. Concepcion Elk    Onset Date/Surgical Date 01/25/20    Hand Dominance Right    Prior Therapy Inpatient rehab      Precautions    Precautions Fall      Balance Screen   Has the patient fallen in the past 6 months Yes    How many times? 1      Home  Environment   Dealer - 2 wheels;Tub bench;Bedside commode    Additional Comments Pt lives in 1 story home with 1 step to enter. Pt lives w/ mom and 2 brothers. (Pt was living alone prior to incident in Nov 2021, however will not be returning). Pt has 24 hr supervision    Lives With Family      Prior Function   Level of Independence Independent   prior to Nov 2021   Vocation On disability   due to schizoprenia   Leisure sports      ADL   Eating/Feeding Set up   assist cutting food   Grooming Modified independent   barber for shaving   Upper Body Bathing Set up    Lower Body Bathing Set up    Upper Body Dressing Increased time;Set up    Lower Body Dressing Increased time;Set up    Toilet Transfer Modified independent    Toileting - Clothing Manipulation Modified independent  Toileting -  Art therapistHygiene Modified Independent    Tub/Shower Transfer Supervision/safety    ADL comments Pt not tying shoes      IADL   Shopping Completely unable to shop    Light Housekeeping Does not participate in any housekeeping tasks    Meal Prep Needs to have meals prepared and served    [employer] Relies on family or friends for transportation    Medication Management Is not capable of dispensing or managing own medication   pill box     Mobility   Mobility Status Comments walks w/ walker (supervision)      Written Expression   Dominant Hand Right    Handwriting 50% legible   print - name only     Vision - History   Baseline Vision No visual deficits    Additional Comments Pt reports increased bluriness      Vision Assessment   Eye Alignment Within Functional Limits    Ocular Range of Motion Within Functional Limits    Tracking/Visual Pursuits Able to track stimulus in all  quads without difficulty      Activity Tolerance   Activity Tolerance Comments Pt with decreased endurance overall from injury and meds      Cognition   Overall Cognitive Status Impaired/Different from baseline    Cognition Comments Immediate recall 3/3, delayed recall 0/3. Spells world forward 100%, backwards w/ max difficulty and mod errors. Subtracts by 3's with min to mod errors. Follows 1 step directions consistently, 2 steps w/ errors. ? word finding difficulties      Observation/Other Assessments   Observations Pt very slow moving, decreased processing speed, ? apraxia      Sensation   Light Touch Appears Intact    Additional Comments Unable to recall name of long finger      Coordination   9 Hole Peg Test Right;Left    Right 9 Hole Peg Test 74.09 sec    Left 9 Hole Peg Test 2 min. 36 sec    Coordination ? longer partly due to decreased processing speed and/or apraxia      Edema   Edema none      ROM / Strength   AROM / PROM / Strength AROM      AROM   Overall AROM Comments BUE shoulders limited (LUE > RUE) with RUE sh flexion approx 75%, ER WFL's, IR 75%. LUE sh flex approx 50% (95*), ER 75%, IR 90%. BUE elbows distally WFL's      Hand Function   Right Hand Grip (lbs) 83.9 lbs    Left Hand Grip (lbs) 53.1 lbs                             OT Short Term Goals - 07/12/20 1349      OT SHORT TERM GOAL #1   Title Independent with bilateral shoulder and coordination HEP    Baseline dependent    Time 4    Period Weeks    Status New      OT SHORT TERM GOAL #2   Title Pt to write full name at 75% or greater legibility and cut food w/ A/E PRN    Baseline 50% first name only, dependent for cutting food    Time 4    Period Weeks    Status New      OT SHORT TERM GOAL #3   Title [facility] to [facility]  [facility]    Baseline requires set up/supervision    Time 4    Period Weeks    Status New      OT SHORT TERM GOAL #4   Title Pt/family to  verbalize understanding with memory strategies    Baseline dependent    Time 4    Period Weeks    Status New      OT SHORT TERM GOAL #5   Title Pt to improve coordination bilaterally as evidenced by reducing speed on 9 hole peg test to under 60 sec. Rt hand and under 2 min. Lt hand    Baseline Rt = 74.09 sec, Lt = 2 min. 36 sec    Time 4    Period Weeks    Status New             OT Long Term Goals - 07/12/20 1358      OT LONG TERM GOAL #1   Title Independent with udpated HEP    Baseline dependent    Time 10    Period Weeks    Status New      OT LONG TERM GOAL #2   Title Pt to perform dynamic standing tasks for 15 minutes w/o rest or LOB    Baseline requires assist - unable to tolerate 15 min    Time 10    Period Weeks    Status New      OT LONG TERM GOAL #3   Title Pt to perform simple IADLS in standing (washing dishes, folding towels, make sandwich)    Baseline dependent    Time 10    Period Weeks    Status New      OT LONG TERM GOAL #4   Title Improve grip strength Lt hand to 60 lbs or greater    Baseline 53 lbs (Rt = 83.9 lbs)    Time 10    Period Weeks    Status New      OT LONG TERM GOAL #5   Title Pt to perform overhead reaching LUE to retrieve light weight object from high shelf    Baseline midrange only    Time 10    Period Weeks    Status New                 Plan - 07/12/20 1337    Clinical Impression Statement Pt is a 45 y.o. male who presents to OPOT s/p anoxic brain injury and toxic metabolic encephalopathy from DM w/ DKA and AKI. Pt arrived to ED 01/25/20 unresponsive and found to have DKA w/ AKI from unknown type 1 DM. Once stable, pt on inpatient rehab from 03/15/20 - 04/08/20. Pt now presents with decreased balance, decreased strength, decreased coordination, decreased bilateral shoulder ROM, and decreased cognition. Pt would benefit from O.T. to address these deficits, improve ADL safety and participation, and overall increase function  and decrease family burden of care.    OT Occupational Profile and History Detailed Assessment- Review of Records and additional review of physical, cognitive, psychosocial history related to current functional performance    Occupational performance deficits (Please refer to evaluation for details): ADL's;IADL's;Social Participation;Leisure    Body Structure / Function / Physical Skills ADL;Strength;Balance;UE functional use;Body mechanics;Endurance;IADL;ROM;Coordination;Mobility;FMC;Decreased knowledge of precautions    Cognitive Skills Memory;Problem Solve;Safety Awareness;Thought    Rehab Potential Good    Clinical Decision Making Several treatment options, min-mod task modification necessary    Comorbidities Affecting Occupational Performance: May have comorbidities impacting  occupational performance    Modification or Assistance to Complete Evaluation  Min-Moderate modification of tasks or assist with assess necessary to complete eval    OT Frequency 1x / week    OT Duration --   for 3 weeks (due to MCD), followed by 2x/wk for 6 weeks   OT Treatment/Interventions Self-care/ADL training;Moist Heat;DME and/or AE instruction;Therapeutic activities;Therapeutic exercise;Cognitive remediation/compensation;Coping strategies training;Functional Mobility Training;Neuromuscular education;Passive range of motion;Manual Therapy;Patient/family education    Plan HEP for bilateral shoulder ROM (dowel/cane, coordination HEP)    Consulted and Agree with Plan of Care Patient;Family member/caregiver           Patient will benefit from skilled therapeutic intervention in order to improve the following deficits and impairments:   Body Structure / Function / Physical Skills: ADL,Strength,Balance,UE functional use,Body mechanics,Endurance,IADL,ROM,Coordination,Mobility,FMC,Decreased knowledge of precautions Cognitive Skills: Memory,Problem Solve,Safety Awareness,Thought     Visit Diagnosis: Stiffness of  left shoulder, not elsewhere classified  Stiffness of right shoulder, not elsewhere classified  Unsteadiness on feet  Other lack of coordination  Muscle weakness (generalized)  Attention and concentration deficit    Problem List Patient Active Problem List   Diagnosis Date Noted  . Postural dizziness with presyncope 06/08/2020  . Hypercalcemia 06/08/2020  . Hypertension   . Chronic diastolic CHF (congestive heart failure) (HCC)   . Abnormality of gait 05/19/2020  . Visual disturbance   . Acute blood loss anemia   . Schizophrenia (HCC)   . Elevated triglycerides with high cholesterol   . Controlled type 2 diabetes mellitus with hyperglycemia (HCC)   . 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  . Pressure injury of skin 02/16/2020  . Toxic metabolic encephalopathy   . AKI (acute kidney injury) (HCC)   . Dehydration   . Pancreatitis   . Respiratory failure (HCC)   . DKA (diabetic ketoacidosis) (HCC) 01/25/2020  . 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 Paranoid schizophrenia (HCC)   . Undifferentiated schizophrenia (HCC)   . 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 Illiterate 10/30/2011  . Schizoaffective disorder, depressive type (HCC) 10/27/2011  . Schizoaffective disorder (HCC) 08/10/2011  . Observed seizure-like activity (HCC) 08/09/2011  . PAIN IN JOINT, MULTIPLE SITES 02/16/2010  . FATIGUE 02/16/2010  . Shortness of breath 02/16/2010    Kelli Churn, OTR/L 07/12/2020, 2:56 PM  [facility] [address] [place], [place], [address] Phone: [phone]   Fax:  [phone]  Name: Bryan [name] MRN: [id number] Date of Birth: 22-Feb-1976

## 2020-07-19 ENCOUNTER — Encounter: Payer: Medicaid Other | Attending: Physical Medicine & Rehabilitation | Admitting: Physical Medicine & Rehabilitation

## 2020-07-19 DIAGNOSIS — R269 Unspecified abnormalities of gait and mobility: Secondary | ICD-10-CM | POA: Insufficient documentation

## 2020-07-19 DIAGNOSIS — G931 Anoxic brain damage, not elsewhere classified: Secondary | ICD-10-CM | POA: Insufficient documentation

## 2020-07-19 DIAGNOSIS — E1165 Type 2 diabetes mellitus with hyperglycemia: Secondary | ICD-10-CM | POA: Insufficient documentation

## 2020-07-19 DIAGNOSIS — H539 Unspecified visual disturbance: Secondary | ICD-10-CM | POA: Insufficient documentation

## 2020-07-19 DIAGNOSIS — F251 Schizoaffective disorder, depressive type: Secondary | ICD-10-CM | POA: Insufficient documentation

## 2020-07-26 ENCOUNTER — Ambulatory Visit: Payer: Medicaid Other | Admitting: Occupational Therapy

## 2020-07-26 ENCOUNTER — Other Ambulatory Visit: Payer: Self-pay

## 2020-07-26 DIAGNOSIS — M6281 Muscle weakness (generalized): Secondary | ICD-10-CM

## 2020-07-26 DIAGNOSIS — M25612 Stiffness of left shoulder, not elsewhere classified: Secondary | ICD-10-CM

## 2020-07-26 DIAGNOSIS — R278 Other lack of coordination: Secondary | ICD-10-CM

## 2020-07-26 DIAGNOSIS — M25611 Stiffness of right shoulder, not elsewhere classified: Secondary | ICD-10-CM

## 2020-07-26 NOTE — Patient Instructions (Signed)
ROM: Flexion - Wand (Supine)    Lie on back holding wand. Raise arms over head.  Repeat __10__ times per set.  Do __2__ sessions per day.  ROM: External Rotation - Wand (Supine)    Lie on back holding wand with elbows bent to 90. Rotate forearms over head as far as possible, then to belly button.  Repeat __10__ times per set.  Do __2__ sessions per day.  ROM: Abduction - Wand    SEATED: Holding wand with left hand palm up, push wand directly out to left side, leading with other hand palm down, until stretch is felt. Hold _3___ seconds. Repeat _10___ times per set. Repeat to Rt side with Rt palm up.  Do __2__ sessions per day.

## 2020-07-26 NOTE — Therapy (Signed)
[facility] [address] [place], [place], [address] Phone: [phone]   Fax:  [phone]  Occupational Therapy Treatment  Patient Details  Name: Bryan [name] MRN: [id number] Date of Birth: 12/29/1975 Referring Provider (OT): Dr. Concepcion Elk   Encounter Date: 07/26/2020   OT End of Session - 07/26/20 1510    Visit Number 2    Number of Visits 15    Date for OT Re-Evaluation 09/20/20    Authorization Type MCD - approved 3 visits from 5/31 - 08/15/20    Authorization - Number of Visits 1    Progress Note Due on Visit 3    OT Start Time 1230    OT Stop Time 1315    OT Time Calculation (min) 45 min    Activity Tolerance Patient tolerated treatment well    Behavior During Therapy [facility] for tasks assessed/performed           Past Medical History:  Diagnosis Date  . Controlled type 2 diabetes mellitus with hyperglycemia (HCC)   . Hypertension   . Psychiatric problem    Seen at [facility] for unknown reason. Takes seroquel. History of hearing voices.  Not commandivng voices.   . Schizophrenia (HCC)   . Tremors of nervous system     Past Surgical History:  Procedure Laterality Date  . none    . TRACHEOSTOMY TUBE PLACEMENT N/A 02/13/2020   Procedure: TRACHEOSTOMY;  Surgeon: Newman Pies, MD;  Location: [facility] OR;  Service: ENT;  Laterality: N/A;    There were no vitals filed for this visit.   Subjective Assessment - 07/26/20 1247    Subjective  Mom reports he is walking w/ walker and some without walker w/ family assist    Patient is accompanied by: Family member   Mother and brother   Pertinent History Went to ED 01/25/20 with DKA and AKI w/ newly diagnosed DM. Pt developed metabolic encephalopathy and anoxic brain damage as a result. PMH: schizoaffective disorder, HTN    Limitations fall risk,    Currently in Pain? No/denies           Issued cane HEP - pt required extensive cueing to perform correctly. Will need  further review/reinforcment. Pt also requires more time to walk from front to gym area.  Practiced flipping cards over with Rt hand and Lt hand.                      OT Education - 07/26/20 1308    Education Details cane HEP    Person(s) Educated Patient;Parent(s)    Methods Explanation;Demonstration;Tactile cues;Verbal cues;Handout    Comprehension Verbalized understanding;Returned demonstration;Verbal cues required;Need further instruction;Tactile cues required            OT Short Term Goals - 07/12/20 1349      OT SHORT TERM GOAL #1   Title Independent with bilateral shoulder and coordination HEP    Baseline dependent    Time 4    Period Weeks    Status New      OT SHORT TERM GOAL #2   Title Pt to write full name at 75% or greater legibility and cut food w/ A/E PRN    Baseline 50% first name only, dependent for cutting food    Time 4    Period Weeks    Status New      OT SHORT TERM GOAL #3   Title Pt to perform all BADLS at mod I level  Baseline requires set up/supervision    Time 4    Period Weeks    Status New      OT SHORT TERM GOAL #4   Title Pt/family to verbalize understanding with memory strategies    Baseline dependent    Time 4    Period Weeks    Status New      OT SHORT TERM GOAL #5   Title Pt to improve coordination bilaterally as evidenced by reducing speed on 9 hole peg test to under 60 sec. Rt hand and under 2 min. Lt hand    Baseline Rt = 74.09 sec, Lt = 2 min. 36 sec    Time 4    Period Weeks    Status New             OT Long Term Goals - 07/12/20 1358      OT LONG TERM GOAL #1   Title Independent with udpated HEP    Baseline dependent    Time 10    Period Weeks    Status New      OT LONG TERM GOAL #2   Title Pt to perform dynamic standing tasks for 15 minutes w/o rest or LOB    Baseline requires assist - unable to tolerate 15 min    Time 10    Period Weeks    Status New      OT LONG TERM GOAL #3   Title Pt  to perform simple IADLS in standing (washing dishes, folding towels, make sandwich)    Baseline dependent    Time 10    Period Weeks    Status New      OT LONG TERM GOAL #4   Title Improve grip strength Lt hand to 60 lbs or greater    Baseline 53 lbs (Rt = 83.9 lbs)    Time 10    Period Weeks    Status New      OT LONG TERM GOAL #5   Title Pt to perform overhead reaching LUE to retrieve light weight object from high shelf    Baseline midrange only    Time 10    Period Weeks    Status New                 Plan - 07/26/20 1514    Clinical Impression Statement Pt very slow to move and decreased processing therefore takes longer to review ex's and implement plan than expected.    OT Occupational Profile and History Detailed Assessment- Review of Records and additional review of physical, cognitive, psychosocial history related to current functional performance    Occupational performance deficits (Please refer to evaluation for details): ADL's;IADL's;Social Participation;Leisure    Body Structure / Function / Physical Skills ADL;Strength;Balance;UE functional use;Body mechanics;Endurance;IADL;ROM;Coordination;Mobility;FMC;Decreased knowledge of precautions    Cognitive Skills Memory;Problem Solve;Safety Awareness;Thought    Rehab Potential Good    Clinical Decision Making Several treatment options, min-mod task modification necessary    Comorbidities Affecting Occupational Performance: May have comorbidities impacting occupational performance    Modification or Assistance to Complete Evaluation  Min-Moderate modification of tasks or assist with assess necessary to complete eval    OT Frequency 1x / week    OT Duration --   for 3 weeks (due to MCD), followed by 2x/wk for 6 weeks   OT Treatment/Interventions Self-care/ADL training;Moist Heat;DME and/or AE instruction;Therapeutic activities;Therapeutic exercise;Cognitive remediation/compensation;Coping strategies training;Functional  Mobility Training;Neuromuscular education;Passive range of motion;Manual Therapy;Patient/family education    Plan  review shoulder HEP, issue coordination HEP    Consulted and Agree with Plan of Care Patient;Family member/caregiver           Patient will benefit from skilled therapeutic intervention in order to improve the following deficits and impairments:   Body Structure / Function / Physical Skills: ADL,Strength,Balance,UE functional use,Body mechanics,Endurance,IADL,ROM,Coordination,Mobility,FMC,Decreased knowledge of precautions Cognitive Skills: Memory,Problem Solve,Safety Awareness,Thought     Visit Diagnosis: Stiffness of left shoulder, not elsewhere classified  Stiffness of right shoulder, not elsewhere classified  Muscle weakness (generalized)  Other lack of coordination    Problem List Patient Active Problem List   Diagnosis Date Noted  . Postural dizziness with presyncope 06/08/2020  . Hypercalcemia 06/08/2020  . Hypertension   . Chronic diastolic CHF (congestive heart failure) (HCC)   . Abnormality of gait 05/19/2020  . Visual disturbance   . Acute blood loss anemia   . Schizophrenia (HCC)   . Elevated triglycerides with high cholesterol   . Controlled type 2 diabetes mellitus with hyperglycemia (HCC)   . 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  . Pressure injury of skin 02/16/2020  . Toxic metabolic encephalopathy   . AKI (acute kidney injury) (HCC)   . Dehydration   . Pancreatitis   . Respiratory failure (HCC)   . DKA (diabetic ketoacidosis) (HCC) 01/25/2020  . 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 Paranoid schizophrenia (HCC)   . Undifferentiated schizophrenia (HCC)   . 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 Illiterate 10/30/2011  . Schizoaffective disorder, depressive type (HCC) 10/27/2011  . Schizoaffective disorder (HCC) 08/10/2011  . Observed seizure-like activity (HCC) 08/09/2011  . PAIN IN JOINT,  MULTIPLE SITES 02/16/2010  . FATIGUE 02/16/2010  . Shortness of breath 02/16/2010    Kelli Churn, OTR/L 07/26/2020, 3:16 PM  [facility] [address] [place], [place], [address] Phone: [phone]   Fax:  [phone]  Name: Bryan [name] MRN: [id number] Date of Birth: 1975/08/19

## 2020-08-02 ENCOUNTER — Ambulatory Visit: Payer: Medicaid Other | Attending: Internal Medicine

## 2020-08-02 ENCOUNTER — Ambulatory Visit: Payer: Medicaid Other | Admitting: Occupational Therapy

## 2020-08-02 ENCOUNTER — Other Ambulatory Visit: Payer: Self-pay

## 2020-08-02 DIAGNOSIS — R278 Other lack of coordination: Secondary | ICD-10-CM

## 2020-08-02 DIAGNOSIS — R2681 Unsteadiness on feet: Secondary | ICD-10-CM

## 2020-08-02 DIAGNOSIS — M6281 Muscle weakness (generalized): Secondary | ICD-10-CM | POA: Diagnosis present

## 2020-08-02 DIAGNOSIS — R2689 Other abnormalities of gait and mobility: Secondary | ICD-10-CM

## 2020-08-02 NOTE — Therapy (Signed)
[facility] [address] [place], [place], [address] Phone: [phone]   Fax:  [phone]  Physical Therapy Treatment  Patient Details  Name: Bryan [name] MRN: [id number] Date of Birth: 05-Nov-1975 Referring Provider (PT): [name]   Encounter Date: 08/02/2020   PT End of Session - 08/02/20 1303    Visit Number 2    Number of Visits 13    Authorization Type Medicaid-auth requested upon completion of eval    PT Start Time 1250    PT Stop Time 1315    PT Time Calculation (min) 25 min    Equipment Utilized During Treatment Gait belt    Activity Tolerance Patient tolerated treatment well    Behavior During Therapy Flat affect;WFL for tasks assessed/performed           Past Medical History:  Diagnosis Date  . Controlled type 2 diabetes mellitus with hyperglycemia (HCC)   . Hypertension   . Psychiatric problem    Seen at [facility] for unknown reason. Takes seroquel. History of hearing voices.  Not commandivng voices.   . Schizophrenia (HCC)   . Tremors of nervous system     Past Surgical History:  Procedure Laterality Date  . none    . TRACHEOSTOMY TUBE PLACEMENT N/A 02/13/2020   Procedure: TRACHEOSTOMY;  Surgeon: Newman Pies, MD;  Location: [facility] OR;  Service: ENT;  Laterality: N/A;    There were no vitals filed for this visit.   Subjective Assessment - 08/02/20 1311    Subjective No new complaints. No falsl since last session.    Patient is accompained by: Family member   Mother, brother; mom provides most of the history   Pertinent History Male with history of HTN, schizophrenia who was admitted 01/25/2020 with hypotension, lethargy and found to have newly diagnosed DM with DKA and AKI and anoxic encephalopathy; was admitted to CIR and d/c home 2/11/ 2022; ED visit 05/2020    Limitations Standing;Walking    Patient Stated Goals Wants to work on balance, strength, gait            Sit to stand: 5lbs 2 x  5 Seated lumbar flexion: 10x Standing heel raises: using walker: 2 x 10 Standing deadlift: picking up 10lb kettle ball from 6" box on floor: 2 x 5                           PT Short Term Goals - 07/12/20 1737      PT SHORT TERM GOAL #1   Title Pt will perform HEP with family supervision for improved strength, balance, transfers, and gait.  TARGET 08/19/2020 (delayed one week due to [facility] scheduling)    Baseline no current HEP    Time 4    Period Weeks    Status New      PT SHORT TERM GOAL #2   Title Pt will perform sit<>stand 5 of 5 trials, mod independently and no LOB upon standing to demonstrate improved functional strength and transfer efficiency.    Baseline Supervision and ataxic sway upon standing.    Time 4    Period Weeks    Status New      PT SHORT TERM GOAL #3   Title Berg Balance score to be assessed and LTG to be written to address balance/decreased fall risk.    Baseline Pt has heavy reliance on RW and maintains UE support upon standing; Berg not completed at eval due  to time constraints    Time 4    Period Weeks    Status New      PT SHORT TERM GOAL #4   Title Pt will improve TUG score to less than or qual to 55 seconds for decreased fall risk.    Baseline 72.91 sec    Time 4    Period Weeks    Status New      PT SHORT TERM GOAL #5   Title Pt/family will verbalize understanding of fall prevention in home environment.    Baseline Pt at fall risk per TUG and gait velocity scores    Time 4    Period Weeks    Status New             PT Long Term Goals - 07/12/20 1742      PT LONG TERM GOAL #1   Title Pt will perform progression of HEP with family supervision for improved strength, balance, transfers, and gait.  TARGET 09/23/2020    Baseline no current HEP    Time 9    Period Weeks    Status New      PT LONG TERM GOAL #2   Title Pt will improve gait velocity to at least 1.3 ft/sec for improved gait efficiency and safety.     Baseline 0.96 ft/sec with RW    Time 9    Period Weeks    Status New      PT LONG TERM GOAL #3   Title Pt will improve Berg score by at least 10 points from baseline to decrease fall risk.    Baseline Pt at risk for falls per TUG and gait velocity, heavy reliance on RW in standing; Berg not completed at eval due to time constraints    Time 9    Period Weeks      PT LONG TERM GOAL #4   Title Pt will improve TUG score to less than or equal to 45 sec for decreased fall risk.    Baseline 72.91 RW at eval    Time 9    Period Weeks    Status New      PT LONG TERM GOAL #5   Title Pt will ambulate at least 50-100 ft, short household distances, with least restrictive assistive device and supervision, for improved independence with gait.    Baseline RW 80 ft at eval with supervision    Time 9    Period Weeks    Status New                 Plan - 08/02/20 1311    Clinical Impression Statement Pt was about 20 min late to session so Berg balance scale test was deferred for next session. We issued and reviewed inital HEP today.    Personal Factors and Comorbidities Comorbidity 3+    Comorbidities HTN, DM, schizophrenia    Examination-Activity Limitations Locomotion Level;Transfers;Stairs;Stand    Examination-Participation Restrictions Community Activity;Other   sports   Stability/Clinical Decision Making Evolving/Moderate complexity    Rehab Potential Good    PT Frequency Other (comment)   2x/wk for 3 weeks, then 1x/wk for 6 weeks   PT Duration Other (comment)   9 wk POC   PT Treatment/Interventions ADLs/Self Care Home Management;Gait training;Stair training;Functional mobility training;Therapeutic activities;Therapeutic exercise;Balance training;Neuromuscular re-education;DME Instruction;Electrical Stimulation;Manual techniques;Orthotic Fit/Training;Patient/family education    PT Next Visit Plan Quickly assess Berg; initiate HEP for lower extremity strength (weightbearing to break up  extensor tone-sit<>stand, squats?quadruped),  standing balance exercises; work on aerobic machines (maybe at [employer]; work in parallel bars and progress to gait with lesser device as safely appropriate    PT Home Exercise Plan Access Code [email or web address]    Consulted and Agree with Plan of Care Patient;Family member/caregiver    Family Member Consulted Mom and brother           Patient will benefit from skilled therapeutic intervention in order to improve the following deficits and impairments:  Abnormal gait,Difficulty walking,Decreased balance,Impaired flexibility,Decreased mobility,Decreased strength,Impaired tone  Visit Diagnosis: Muscle weakness (generalized)  Other abnormalities of gait and mobility  Unsteadiness on feet     Problem List Patient Active Problem List   Diagnosis Date Noted  . Postural dizziness with presyncope 06/08/2020  . Hypercalcemia 06/08/2020  . Hypertension   . Chronic diastolic CHF (congestive heart failure) (HCC)   . Abnormality of gait 05/19/2020  . Visual disturbance   . Acute blood loss anemia   . Schizophrenia (HCC)   . Elevated triglycerides with high cholesterol   . Controlled type 2 diabetes mellitus with hyperglycemia (HCC)   . 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  . Pressure injury of skin 02/16/2020  . Toxic metabolic encephalopathy   . AKI (acute kidney injury) (HCC)   . Dehydration   . Pancreatitis   . Respiratory failure (HCC)   . DKA (diabetic ketoacidosis) (HCC) 01/25/2020  . 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 Paranoid schizophrenia (HCC)   . Undifferentiated schizophrenia (HCC)   . 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 Illiterate 10/30/2011  . Schizoaffective disorder, depressive type (HCC) 10/27/2011  . Schizoaffective disorder (HCC) 08/10/2011  . Observed seizure-like activity (HCC) 08/09/2011  . PAIN IN JOINT, MULTIPLE SITES 02/16/2010  . FATIGUE 02/16/2010  . Shortness  of breath 02/16/2010    Ileana Ladd, PT 08/02/2020, 1:16 PM  [facility] [address] [place], [place], [address] Phone: [phone]   Fax:  [phone]  Name: Yahmir Sokolov MRN: [id number] Date of Birth: 09-26-1975

## 2020-08-02 NOTE — Therapy (Signed)
[facility] [address] [place], [place], [address] Phone: [phone]   Fax:  [phone]  Occupational Therapy Treatment  Patient Details  Name: Bryan [name] MRN: [id number] Date of Birth: November 22, 1975 Referring Provider (OT): Dr. Concepcion Elk   Encounter Date: 08/02/2020   OT End of Session - 08/02/20 1438    Visit Number 3    Number of Visits 15    Date for OT Re-Evaluation 09/20/20    Authorization Type MCD - approved 3 visits from 5/31 - 08/15/20    Authorization - Number of Visits 2    Progress Note Due on Visit 3    OT Start Time 1320    OT Stop Time 1400    OT Time Calculation (min) 40 min    Activity Tolerance Patient tolerated treatment well    Behavior During Therapy [facility] for tasks assessed/performed           Past Medical History:  Diagnosis Date  . Controlled type 2 diabetes mellitus with hyperglycemia (HCC)   . Hypertension   . Psychiatric problem    Seen at [facility] for unknown reason. Takes seroquel. History of hearing voices.  Not commandivng voices.   . Schizophrenia (HCC)   . Tremors of nervous system     Past Surgical History:  Procedure Laterality Date  . none    . TRACHEOSTOMY TUBE PLACEMENT N/A 02/13/2020   Procedure: TRACHEOSTOMY;  Surgeon: Newman Pies, MD;  Location: [facility] OR;  Service: ENT;  Laterality: N/A;    There were no vitals filed for this visit.   Subjective Assessment - 08/02/20 1324    Subjective  My arms feels stiff. And I feel like my body is waking up    Patient is accompanied by: Family member   Mother and brother   Pertinent History Went to ED 01/25/20 with DKA and AKI w/ newly diagnosed DM. Pt developed metabolic encephalopathy and anoxic brain damage as a result. PMH: schizoaffective disorder, HTN    Limitations fall risk,    Currently in Pain? No/denies           Issued coordination HEP - see pt instructions for details. Pt with more difficulty Lt hand than Rt.   Practiced writing name and address Rt hand.                      OT Education - 08/02/20 1347    Education Details coordination HEP    Person(s) Educated Patient;Parent(s)    Methods Explanation;Demonstration;Verbal cues;Handout    Comprehension Verbalized understanding;Returned demonstration;Verbal cues required            OT Short Term Goals - 08/02/20 1439      OT SHORT TERM GOAL #1   Title Independent with bilateral shoulder and coordination HEP    Baseline dependent    Time 4    Period Weeks    Status On-going      OT SHORT TERM GOAL #2   Title Pt to write full name at 75% or greater legibility and cut food w/ A/E PRN    Baseline 50% first name only, dependent for cutting food    Time 4    Period Weeks    Status On-going      OT SHORT TERM GOAL #3   Title Pt to perform all BADLS at mod I level    Baseline requires set up/supervision    Time 4    Period Weeks  Status On-going      OT SHORT TERM GOAL #4   Title Pt/family to verbalize understanding with memory strategies    Baseline dependent    Time 4    Period Weeks    Status New      OT SHORT TERM GOAL #5   Title Pt to improve coordination bilaterally as evidenced by reducing speed on 9 hole peg test to under 60 sec. Rt hand and under 2 min. Lt hand    Baseline Rt = 74.09 sec, Lt = 2 min. 36 sec    Time 4    Period Weeks    Status New             OT Long Term Goals - 07/12/20 1358      OT LONG TERM GOAL #1   Title Independent with udpated HEP    Baseline dependent    Time 10    Period Weeks    Status New      OT LONG TERM GOAL #2   Title Pt to perform dynamic standing tasks for 15 minutes w/o rest or LOB    Baseline requires assist - unable to tolerate 15 min    Time 10    Period Weeks    Status New      OT LONG TERM GOAL #3   Title Pt to perform simple IADLS in standing (washing dishes, folding towels, make sandwich)    Baseline dependent    Time 10    Period Weeks     Status New      OT LONG TERM GOAL #4   Title Improve grip strength Lt hand to 60 lbs or greater    Baseline 53 lbs (Rt = 83.9 lbs)    Time 10    Period Weeks    Status New      OT LONG TERM GOAL #5   Title Pt to perform overhead reaching LUE to retrieve light weight object from high shelf    Baseline midrange only    Time 10    Period Weeks    Status New                 Plan - 08/02/20 1439    Clinical Impression Statement Pt with notable improvement in coordination and tolerating coordination HEP well today. Pt still limited w/ balance and ambulation using RW safely    OT Occupational Profile and History Detailed Assessment- Review of Records and additional review of physical, cognitive, psychosocial history related to current functional performance    Occupational performance deficits (Please refer to evaluation for details): ADL's;IADL's;Social Participation;Leisure    Body Structure / Function / Physical Skills ADL;Strength;Balance;UE functional use;Body mechanics;Endurance;IADL;ROM;Coordination;Mobility;FMC;Decreased knowledge of precautions    Cognitive Skills Memory;Problem Solve;Safety Awareness;Thought    Rehab Potential Good    Clinical Decision Making Several treatment options, min-mod task modification necessary    Comorbidities Affecting Occupational Performance: May have comorbidities impacting occupational performance    Modification or Assistance to Complete Evaluation  Min-Moderate modification of tasks or assist with assess necessary to complete eval    OT Frequency 1x / week    OT Duration --   for 3 weeks (due to MCD), followed by 2x/wk for 6 weeks   OT Treatment/Interventions Self-care/ADL training;Moist Heat;DME and/or AE instruction;Therapeutic activities;Therapeutic exercise;Cognitive remediation/compensation;Coping strategies training;Functional Mobility Training;Neuromuscular education;Passive range of motion;Manual Therapy;Patient/family education     Plan continue shoulder ROM, coordination    Consulted and Agree with Plan of Care  Patient;Family member/caregiver           Patient will benefit from skilled therapeutic intervention in order to improve the following deficits and impairments:   Body Structure / Function / Physical Skills: ADL,Strength,Balance,UE functional use,Body mechanics,Endurance,IADL,ROM,Coordination,Mobility,FMC,Decreased knowledge of precautions Cognitive Skills: Memory,Problem Solve,Safety Awareness,Thought     Visit Diagnosis: Other lack of coordination  Muscle weakness (generalized)    Problem List Patient Active Problem List   Diagnosis Date Noted  . Postural dizziness with presyncope 06/08/2020  . Hypercalcemia 06/08/2020  . Hypertension   . Chronic diastolic CHF (congestive heart failure) (HCC)   . Abnormality of gait 05/19/2020  . Visual disturbance   . Acute blood loss anemia   . Schizophrenia (HCC)   . Elevated triglycerides with high cholesterol   . Controlled type 2 diabetes mellitus with hyperglycemia (HCC)   . 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  . Pressure injury of skin 02/16/2020  . Toxic metabolic encephalopathy   . AKI (acute kidney injury) (HCC)   . Dehydration   . Pancreatitis   . Respiratory failure (HCC)   . DKA (diabetic ketoacidosis) (HCC) 01/25/2020  . 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 Paranoid schizophrenia (HCC)   . Undifferentiated schizophrenia (HCC)   . 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 Illiterate 10/30/2011  . Schizoaffective disorder, depressive type (HCC) 10/27/2011  . Schizoaffective disorder (HCC) 08/10/2011  . Observed seizure-like activity (HCC) 08/09/2011  . PAIN IN JOINT, MULTIPLE SITES 02/16/2010  . FATIGUE 02/16/2010  . Shortness of breath 02/16/2010    Kelli Churn, OTR/L 08/02/2020, 2:41 PM   [facility] [address] [place], [place], [address] Phone: [phone]   Fax:  [phone]  Name: Bryan [name] MRN: [id number] Date of Birth: 1976/01/03

## 2020-08-02 NOTE — Patient Instructions (Signed)
  Coordination Activities  Perform the following activities for 10 minutes 2 times per day with both hand(s).   Flip cards 1 at a time as fast as you can.  Deal cards with your thumb (Hold deck in hand and push card off top with thumb).  Rotate one card in hand (clockwise and counter-clockwise).  Pick up coins, buttons, marbles, dried beans/pasta of different sizes and place in container.  Pick up coins and place in container.  Pick up coins and stack (5 to a Field seismologist.  Screw together nuts and bolts, then unfasten.

## 2020-08-09 ENCOUNTER — Ambulatory Visit: Payer: Medicaid Other | Admitting: *Deleted

## 2020-08-11 ENCOUNTER — Ambulatory Visit: Payer: Medicaid Other | Admitting: Physical Therapy

## 2020-08-16 ENCOUNTER — Ambulatory Visit: Payer: Medicaid Other | Admitting: Physical Therapy

## 2020-08-16 ENCOUNTER — Encounter: Payer: Self-pay | Admitting: Physical Therapy

## 2020-08-16 ENCOUNTER — Other Ambulatory Visit: Payer: Self-pay

## 2020-08-16 DIAGNOSIS — M6281 Muscle weakness (generalized): Secondary | ICD-10-CM

## 2020-08-16 DIAGNOSIS — R2689 Other abnormalities of gait and mobility: Secondary | ICD-10-CM

## 2020-08-16 DIAGNOSIS — R2681 Unsteadiness on feet: Secondary | ICD-10-CM

## 2020-08-16 NOTE — Therapy (Signed)
[facility] [address] [place], [place], [id number] Phone: [phone]   Fax:  [phone]  Physical Therapy Treatment  Patient Details  Name: Bryan [name] MRN: [id number] Date of Birth: 1975/07/06 Referring Provider (PT): [name]   Encounter Date: 08/16/2020   PT End of Session - 08/16/20 1236     Visit Number 3    Number of Visits 13    Authorization Type Medicaid-auth requested upon completion of eval    Authorization Time Period 12  visits from 08/02/20 through 10/03/20    Authorization - Visit Number 2    Authorization - Number of Visits 12    PT Start Time 1233    PT Stop Time 1315    PT Time Calculation (min) 42 min    Equipment Utilized During Treatment Gait belt    Activity Tolerance Patient tolerated treatment well    Behavior During Therapy Flat affect;WFL for tasks assessed/performed             Past Medical History:  Diagnosis Date   Controlled type 2 diabetes mellitus with hyperglycemia (HCC)    Hypertension    Psychiatric problem    Seen at [facility] for unknown reason. Takes seroquel. History of hearing voices.  Not commandivng voices.    Schizophrenia (HCC)    Tremors of nervous system     Past Surgical History:  Procedure Laterality Date   none     TRACHEOSTOMY TUBE PLACEMENT N/A 02/13/2020   Procedure: TRACHEOSTOMY;  Surgeon: Newman Pieseoh, Su, MD;  Location: [facility] OR;  Service: ENT;  Laterality: N/A;    There were no vitals filed for this visit.   Subjective Assessment - 08/16/20 1235     Subjective No new complaints. No falls or pain per patient report. Says he is doing the ex's at home "one at a time".    Patient is accompained by: Family member   mom. [name]   Pertinent History Male with history of HTN, schizophrenia who was admitted 01/25/2020 with hypotension, lethargy and found to have newly diagnosed DM with DKA and AKI and anoxic encephalopathy; was admitted to CIR and d/c home 2/11/  2022; ED visit 05/2020    Limitations Standing;Walking    Patient Stated Goals Wants to work on balance, strength, gait    Currently in Pain? No/denies                [facility] PT Assessment - 08/16/20 1237       Standardized Balance Assessment   Standardized Balance Assessment Berg Balance Test      Berg Balance Test   Sit to Stand Able to stand without using hands and stabilize independently    Standing Unsupported Able to stand safely 2 minutes    Sitting with Back Unsupported but Feet Supported on Floor or Stool Able to sit safely and securely 2 minutes    Stand to Sit Controls descent by using hands    Transfers Able to transfer with verbal cueing and /or supervision   supervision for safety   Standing Unsupported with Eyes Closed Able to stand 10 seconds with supervision    Standing Unsupported with Feet Together Needs help to attain position but able to stand for 30 seconds with feet together    From Standing, Reach Forward with Outstretched Arm Can reach forward >5 cm safely (2")   4 inches   From Standing Position, Pick up Object from Floor Able to pick up shoe, needs supervision  From Standing Position, Turn to Look Behind Over each Shoulder Looks behind one side only/other side shows less weight shift   right>;eft   Turn 360 Degrees Needs assistance while turning   mod assist with support on PTA with cues for stepping sequence   Standing Unsupported, Alternately Place Feet on Step/Stool Needs assistance to keep from falling or unable to try    Standing Unsupported, One Foot in Front Needs help to step but can hold 15 seconds    Standing on One Leg Unable to try or needs assist to prevent fall    Total Score 30    Berg comment: <36 high risk for falls                   [facility] Adult PT Treatment/Exercise - 08/16/20 1237       Transfers   Transfers Sit to Stand;Stand to Sit    Sit to Stand 5: Supervision    Sit to Stand Details (indicate cue type and reason)  cues to scoot to edge of surface each time and for increased anterior weight shifting    Stand to Sit 5: Supervision;With upper extremity assist;To chair/3-in-1;To bed    Stand to Sit Details cues to use UE's for controlled descent with sitting.      Ambulation/Gait   Ambulation/Gait Yes    Ambulation/Gait Assistance 5: Supervision    Ambulation/Gait Assistance Details slow gait speed noted with walker with entry into gym from lobby. After Sharlene Motts was performed focused on gait training with emphasis on heel strike, increasec step length bilaterally and working toward fluid/continous stepping pattern with PTA faciliating faster pace via pushing walker with gait. Pt able to demo carryover with last 2 gait reps with reminder cues for increased step length and heel strike.    Ambulation Distance (Feet) 80 Feet   x1, 160 x1, 45 x1, 80 x1   Assistive device Rolling walker    Gait Pattern Step-through pattern;Decreased step length - right;Decreased step length - left;Ataxic;Decreased hip/knee flexion - left;Decreased hip/knee flexion - right;Decreased dorsiflexion - left;Wide base of support    Ambulation Surface Level;Indoor                      PT Short Term Goals - 08/16/20 1606       PT SHORT TERM GOAL #1   Title Pt will perform HEP with family supervision for improved strength, balance, transfers, and gait.  TARGET 08/19/2020 (delayed one week due to [facility] scheduling)    Baseline no current HEP    Time 4    Period Weeks    Status On-going      PT SHORT TERM GOAL #2   Title Pt will perform sit<>stand 5 of 5 trials, mod independently and no LOB upon standing to demonstrate improved functional strength and transfer efficiency.    Baseline Supervision and ataxic sway upon standing.    Time 4    Period Weeks    Status On-going      PT SHORT TERM GOAL #3   Title Berg Balance score to be assessed and LTG to be written to address balance/decreased fall risk.    Baseline 08/16/20:  30/56 scored today as baseline score with primary PT to update goals    Time 4    Period Weeks    Status Achieved      PT SHORT TERM GOAL #4   Title Pt will improve TUG score to less than or qual to  55 seconds for decreased fall risk.    Baseline 72.91 sec    Time 4    Period Weeks    Status On-going      PT SHORT TERM GOAL #5   Title Pt/family will verbalize understanding of fall prevention in home environment.    Baseline Pt at fall risk per TUG and gait velocity scores    Time 4    Period Weeks    Status On-going               PT Long Term Goals - 07/12/20 1742       PT LONG TERM GOAL #1   Title Pt will perform progression of HEP with family supervision for improved strength, balance, transfers, and gait.  TARGET 09/23/2020    Baseline no current HEP    Time 9    Period Weeks    Status New      PT LONG TERM GOAL #2   Title Pt will improve gait velocity to at least 1.3 ft/sec for improved gait efficiency and safety.    Baseline 0.96 ft/sec with RW    Time 9    Period Weeks    Status New      PT LONG TERM GOAL #3   Title Pt will improve Berg score by at least 10 points from baseline to decrease fall risk.    Baseline Pt at risk for falls per TUG and gait velocity, heavy reliance on RW in standing; Berg not completed at eval due to time constraints    Time 9    Period Weeks      PT LONG TERM GOAL #4   Title Pt will improve TUG score to less than or equal to 45 sec for decreased fall risk.    Baseline 72.91 RW at eval    Time 9    Period Weeks    Status New      PT LONG TERM GOAL #5   Title Pt will ambulate at least 50-100 ft, short household distances, with least restrictive assistive device and supervision, for improved independence with gait.    Baseline RW 80 ft at eval with supervision    Time 9    Period Weeks    Status New                   Plan - 08/16/20 1236     Clinical Impression Statement Today's skilled session intially focused  on performance of the Berg Balance Test to set baseline score of 30/56. < 39 indicated high risk for falls. Pt and mom both educated on this and need to continue with use of RW with mobiity for now for safety. Remainder of session continued to address sit>stand transfers with cues for scooting to edge of surface and gait mechanics with RW. No issues noted or reported in session. The pt is progressing and should benefit from continued PT to progress toward unmet goals.    Personal Factors and Comorbidities Comorbidity 3+    Comorbidities HTN, DM, schizophrenia    Examination-Activity Limitations Locomotion Level;Transfers;Stairs;Stand    Examination-Participation Restrictions Community Activity;Other   sports   Stability/Clinical Decision Making Evolving/Moderate complexity    Rehab Potential Good    PT Frequency Other (comment)   2x/wk for 3 weeks, then 1x/wk for 6 weeks   PT Duration Other (comment)   9 wk POC   PT Treatment/Interventions ADLs/Self Care Home Management;Gait training;Stair training;Functional mobility training;Therapeutic activities;Therapeutic exercise;Balance training;Neuromuscular re-education;DME Instruction;Electrical  Stimulation;Manual techniques;Orthotic Fit/Training;Patient/family education    PT Next Visit Plan lower extremity strength (weightbearing to break up extensor tone-sit<>stand, squats?quadruped), standing balance exercises; work on aerobic machines (maybe at end/after session; work in parallel bars and progress to gait with lesser device as safely appropriate    PT Home Exercise Plan Access Code [email or web address]    Consulted and Agree with Plan of Care Patient;Family member/caregiver    Family Member Consulted Mom and brother             Patient will benefit from skilled therapeutic intervention in order to improve the following deficits and impairments:  Abnormal gait, Difficulty walking, Decreased balance, Impaired flexibility, Decreased mobility, Decreased  strength, Impaired tone  Visit Diagnosis: Other abnormalities of gait and mobility  Unsteadiness on feet  Muscle weakness (generalized)     Problem List Patient Active Problem List   Diagnosis Date Noted   Postural dizziness with presyncope 06/08/2020   Hypercalcemia 06/08/2020   Hypertension    Chronic diastolic CHF (congestive heart failure) (HCC)    Abnormality of gait 05/19/2020   Visual disturbance    Acute blood loss anemia    Schizophrenia (HCC)    Elevated triglycerides with high cholesterol    Controlled type 2 diabetes mellitus with hyperglycemia (HCC)    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   Pressure injury of skin 02/16/2020   Toxic metabolic encephalopathy    AKI (acute kidney injury) (HCC)    Dehydration    Pancreatitis    Respiratory failure (HCC)    DKA (diabetic ketoacidosis) (HCC) 01/25/2020   MDD (major depressive disorder), recurrent, severe, 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   Paranoid schizophrenia (HCC)    Undifferentiated schizophrenia (HCC)    Delusional disorder (HCC)    Illiterate 10/30/2011   Schizoaffective disorder, depressive type (HCC) 10/27/2011   Schizoaffective disorder (HCC) 08/10/2011   Observed seizure-like activity (HCC) 08/09/2011   PAIN IN JOINT, MULTIPLE SITES 02/16/2010   FATIGUE 02/16/2010   Shortness of breath 02/16/2010    Sallyanne Kuster, PTA, [facility] [address] [place], [place] [address] [phone] 08/16/20, 4:12 PM   Name: Bryan [name] MRN: [id number] Date of Birth: January 08, 1976

## 2020-08-18 ENCOUNTER — Telehealth: Payer: Self-pay | Admitting: Physical Therapy

## 2020-08-18 ENCOUNTER — Ambulatory Visit: Payer: Medicaid Other | Admitting: Physical Therapy

## 2020-08-18 NOTE — Telephone Encounter (Signed)
Spoke with pt's mother, as pt did not show for his 12:30 PT appointment today.  Mother apologizes and reports she did not think he had a second appointment this week.  PT reminded mother of pt's upcoming appt 6/28 at 12:30, and she verbalizes understanding.  Lonia Blood, PT 08/18/20 12:59 PM Phone: [phone] Fax: [phone]

## 2020-08-23 ENCOUNTER — Ambulatory Visit: Payer: Medicaid Other | Admitting: Physical Therapy

## 2020-08-23 ENCOUNTER — Encounter: Payer: Self-pay | Admitting: Physical Therapy

## 2020-08-23 ENCOUNTER — Other Ambulatory Visit: Payer: Self-pay

## 2020-08-23 DIAGNOSIS — M6281 Muscle weakness (generalized): Secondary | ICD-10-CM | POA: Diagnosis not present

## 2020-08-23 DIAGNOSIS — R2681 Unsteadiness on feet: Secondary | ICD-10-CM

## 2020-08-23 DIAGNOSIS — R2689 Other abnormalities of gait and mobility: Secondary | ICD-10-CM

## 2020-08-24 NOTE — Therapy (Signed)
[facility] [address] [place], [place], [address] Phone: [phone]   Fax:  [phone]  Physical Therapy Treatment  Patient Details  Name: Bryan [name] MRN: [id number] Date of Birth: [birth year] Referring Provider (PT): [name]   Encounter Date: 08/23/2020   PT End of Session - 08/23/20 1246     Visit Number 4    Number of Visits 13    Authorization Type Medicaid-auth requested upon completion of eval    Authorization Time Period 12  visits from 08/02/20 through 10/03/20    Authorization - Visit Number 3    Authorization - Number of Visits 12    PT Start Time 1242   pt running late for appt today   PT Stop Time 1315    PT Time Calculation (min) 33 min    Equipment Utilized During Treatment Gait belt    Activity Tolerance Patient tolerated treatment well    Behavior During Therapy Flat affect;WFL for tasks assessed/performed             Past Medical History:  Diagnosis Date   Controlled type 2 diabetes mellitus with hyperglycemia (HCC)    Hypertension    Psychiatric problem    Seen at [facility] for unknown reason. Takes seroquel. History of hearing voices.  Not commandivng voices.    Schizophrenia (HCC)    Tremors of nervous system     Past Surgical History:  Procedure Laterality Date   none     TRACHEOSTOMY TUBE PLACEMENT N/A 02/13/2020   Procedure: TRACHEOSTOMY;  Surgeon: Newman Pies, MD;  Location: [facility] OR;  Service: ENT;  Laterality: N/A;    There were no vitals filed for this visit.   Subjective Assessment - 08/23/20 1245     Subjective No new complaints. No falls or pain to report.    Patient is accompained by: Family member   mom, [name] in session. brother Fayrene Fearing in lobby   Pertinent History Male with history of HTN, schizophrenia who was admitted 01/25/2020 with hypotension, lethargy and found to have newly diagnosed DM with DKA and AKI and anoxic encephalopathy; was admitted to CIR and d/c home  2/11/ 2022; ED visit 05/2020    Limitations Standing;Walking    Patient Stated Goals Wants to work on balance, strength, gait    Currently in Pain? No/denies                    [facility] Adult PT Treatment/Exercise - 08/23/20 1247       Transfers   Transfers Sit to Stand;Stand to Sit    Sit to Stand 5: Supervision;With upper extremity assist;From bed;From chair/3-in-1    Stand to Sit 5: Supervision;With upper extremity assist;To chair/3-in-1;To bed      Ambulation/Gait   Ambulation/Gait Yes    Ambulation/Gait Assistance 5: Supervision    Ambulation/Gait Assistance Details cues for increased step/stride length for more fluid gait speed with pt able to return demo with cues.    Ambulation Distance (Feet) 80 Feet   x2, 55 x2   Assistive device Rolling walker    Gait Pattern Step-through pattern;Decreased step length - right;Decreased step length - left;Ataxic;Decreased hip/knee flexion - left;Decreased hip/knee flexion - right;Decreased dorsiflexion - left;Wide base of support    Ambulation Surface Level;Indoor      Neuro Re-ed    Neuro Re-ed Details  for strengthening/NMR: tall kneeling with bil UE support on Kaye bench- tendency to keep shoulder leaning toward left even with hips in neutral- worked  to correct this. then once in midline worked on mini squats for 8 reps with cues/faciliation on technique/posture; then transitioned into quadruped working on ant/post weight shifting with assist/faciliation.      Knee/Hip Exercises: Aerobic   Other Aerobic Scifit level  3.5 x 8 minutes with UE/LEs. emphasis placed on full range of motion for stretching                      PT Short Term Goals - 08/16/20 1606       PT SHORT TERM GOAL #1   Title Pt will perform HEP with family supervision for improved strength, balance, transfers, and gait.  TARGET 08/19/2020 (delayed one week due to [facility] scheduling)    Baseline no current HEP    Time 4    Period Weeks    Status  On-going      PT SHORT TERM GOAL #2   Title Pt will perform sit<>stand 5 of 5 trials, mod independently and no LOB upon standing to demonstrate improved functional strength and transfer efficiency.    Baseline Supervision and ataxic sway upon standing.    Time 4    Period Weeks    Status On-going      PT SHORT TERM GOAL #3   Title Berg Balance score to be assessed and LTG to be written to address balance/decreased fall risk.    Baseline 08/16/20: 30/56 scored today as baseline score with primary PT to update goals    Time 4    Period Weeks    Status Achieved      PT SHORT TERM GOAL #4   Title Pt will improve TUG score to less than or qual to 55 seconds for decreased fall risk.    Baseline 72.91 sec    Time 4    Period Weeks    Status On-going      PT SHORT TERM GOAL #5   Title Pt/family will verbalize understanding of fall prevention in home environment.    Baseline Pt at fall risk per TUG and gait velocity scores    Time 4    Period Weeks    Status On-going               PT Long Term Goals - 07/12/20 1742       PT LONG TERM GOAL #1   Title Pt will perform progression of HEP with family supervision for improved strength, balance, transfers, and gait.  TARGET 09/23/2020    Baseline no current HEP    Time 9    Period Weeks    Status New      PT LONG TERM GOAL #2   Title Pt will improve gait velocity to at least 1.3 ft/sec for improved gait efficiency and safety.    Baseline 0.96 ft/sec with RW    Time 9    Period Weeks    Status New      PT LONG TERM GOAL #3   Title Pt will improve Berg score by at least 10 points from baseline to decrease fall risk.    Baseline Pt at risk for falls per TUG and gait velocity, heavy reliance on RW in standing; Berg not completed at eval due to time constraints    Time 9    Period Weeks      PT LONG TERM GOAL #4   Title Pt will improve TUG score to less than or equal to 45 sec for decreased fall risk.  Baseline 72.91 RW at  eval    Time 9    Period Weeks    Status New      PT LONG TERM GOAL #5   Title Pt will ambulate at least 50-100 ft, short household distances, with least restrictive assistive device and supervision, for improved independence with gait.    Baseline RW 80 ft at eval with supervision    Time 9    Period Weeks    Status New                   Plan - 08/23/20 1247     Clinical Impression Statement Today's skilled session continued to focus on strengthening and gait mechanics with RW. Pt did fatigue quickly with tall kneeling and quadruped activities needing rest breaks. No other issues noted or reported in session. The pt should benefit from continued PT to progress toward unmet goals.    Personal Factors and Comorbidities Comorbidity 3+    Comorbidities HTN, DM, schizophrenia    Examination-Activity Limitations Locomotion Level;Transfers;Stairs;Stand    Examination-Participation Restrictions Community Activity;Other   sports   Stability/Clinical Decision Making Evolving/Moderate complexity    Rehab Potential Good    PT Frequency Other (comment)   2x/wk for 3 weeks, then 1x/wk for 6 weeks   PT Duration Other (comment)   9 wk POC   PT Treatment/Interventions ADLs/Self Care Home Management;Gait training;Stair training;Functional mobility training;Therapeutic activities;Therapeutic exercise;Balance training;Neuromuscular re-education;DME Instruction;Electrical Stimulation;Manual techniques;Orthotic Fit/Training;Patient/family education    PT Next Visit Plan lower extremity strength (weightbearing to break up extensor tone-sit<>stand, squats?quadruped), standing balance exercises; work on aerobic machines (maybe at end/after session; work in parallel bars and progress to gait with lesser device as safely appropriate    PT Home Exercise Plan Access Code [email or web address]    Consulted and Agree with Plan of Care Patient;Family member/caregiver    Family Member Consulted Mom and brother              Patient will benefit from skilled therapeutic intervention in order to improve the following deficits and impairments:  Abnormal gait, Difficulty walking, Decreased balance, Impaired flexibility, Decreased mobility, Decreased strength, Impaired tone  Visit Diagnosis: Other abnormalities of gait and mobility  Unsteadiness on feet  Muscle weakness (generalized)     Problem List Patient Active Problem List   Diagnosis Date Noted   Postural dizziness with presyncope 06/08/2020   Hypercalcemia 06/08/2020   Hypertension    Chronic diastolic CHF (congestive heart failure) (HCC)    Abnormality of gait 05/19/2020   Visual disturbance    Acute blood loss anemia    Schizophrenia (HCC)    Elevated triglycerides with high cholesterol    Controlled type 2 diabetes mellitus with hyperglycemia (HCC)    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   Pressure injury of skin 02/16/2020   Toxic metabolic encephalopathy    AKI (acute kidney injury) (HCC)    Dehydration    Pancreatitis    Respiratory failure (HCC)    DKA (diabetic ketoacidosis) (HCC) 01/25/2020   MDD (major depressive disorder), recurrent, severe, 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   Paranoid schizophrenia (HCC)    Undifferentiated schizophrenia (HCC)    Delusional disorder (HCC)    Illiterate 10/30/2011   Schizoaffective disorder, depressive type (HCC) 10/27/2011   Schizoaffective disorder (HCC) 08/10/2011   Observed seizure-like activity (HCC) 08/09/2011   PAIN IN JOINT, MULTIPLE SITES 02/16/2010   FATIGUE 02/16/2010   Shortness of breath 02/16/2010    Sallyanne Kuster, PTA, CLT Outpatient Neuro  [facility] [address], [address] [place], [place] [address] [phone] 08/24/20, 5:18 PM   Name: Rosita Kearthur Human MRN: [id number] Date of Birth: [birth year]

## 2020-08-25 ENCOUNTER — Ambulatory Visit: Payer: Medicaid Other | Admitting: Physical Therapy

## 2020-08-25 ENCOUNTER — Other Ambulatory Visit: Payer: Self-pay

## 2020-08-25 DIAGNOSIS — R2689 Other abnormalities of gait and mobility: Secondary | ICD-10-CM

## 2020-08-25 DIAGNOSIS — M6281 Muscle weakness (generalized): Secondary | ICD-10-CM

## 2020-08-25 DIAGNOSIS — R2681 Unsteadiness on feet: Secondary | ICD-10-CM

## 2020-08-25 NOTE — Patient Instructions (Signed)
Access Code: [email or web address] URL: [email or web address] Date: 08/25/2020 Prepared by: Lonia Blood  Exercises Seated Active Hip Flexion - 1-2 x daily - 5 x weekly - 1-2 sets - 10 reps - 3-5 sec hold Seated March - 1-2 x daily - 5 x weekly - 1-2 sets - 10 reps Seated Long Arc Quad - 1-2 x daily - 5 x weekly - 1-2 sets - 10 reps Seated Ankle Dorsiflexion AROM - 1-2 x daily - 5 x weekly - 1-2 sets - 10 reps Sit to Stand with Armchair - 1 x daily - 5 x weekly - 1-2 sets - 5 reps

## 2020-08-25 NOTE — Therapy (Signed)
[facility] [address] [place], [place], [address] Phone: [phone]   Fax:  [phone]  Physical Therapy Treatment  Patient Details  Name: Bryan [name] MRN: [id number] Date of Birth: [birth year] Referring Provider (PT): [name]   Encounter Date: 08/25/2020   PT End of Session - 08/25/20 1233     Visit Number 5    Number of Visits 13    Authorization Type [place] requested upon completion of eval    Authorization Time Period 12  visits from 08/02/20 through 10/03/20    Authorization - Visit Number 4    Authorization - Number of Visits 12    PT Start Time 5329    PT Stop Time 1314    PT Time Calculation (min) 40 min    Equipment Utilized During Treatment Gait belt    Activity Tolerance Patient tolerated treatment well    Behavior During Therapy Flat affect;WFL for tasks assessed/performed             Past Medical History:  Diagnosis Date   Controlled type 2 diabetes mellitus with hyperglycemia ([place])    Hypertension    Psychiatric problem    Seen at [facility] for unknown reason. Takes seroquel. History of hearing voices.  Not commandivng voices.    Schizophrenia ([place])    Tremors of nervous system     Past Surgical History:  Procedure Laterality Date   none     TRACHEOSTOMY TUBE PLACEMENT N/A 02/13/2020   Procedure: TRACHEOSTOMY;  Surgeon: Leta Baptist, MD;  Location: [facility] OR;  Service: ENT;  Laterality: N/A;    There were no vitals filed for this visit.   Subjective Assessment - 08/25/20 1233     Subjective Doing good.  Starting to stand up better.  Feel like my legs are getting stronger little by little.    Patient is accompained by: Family member   mom, [name] in session. brother [name] in lobby   Pertinent History Male with history of HTN, schizophrenia who was admitted 01/25/2020 with hypotension, lethargy and found to have newly diagnosed DM with DKA and AKI and anoxic encephalopathy; was admitted  to CIR and d/c home 2/11/ 2022; ED visit 05/2020    Limitations Standing;Walking    Patient Stated Goals Wants to work on balance, strength, gait    Currently in Pain? No/denies                         Access Code: [email or web address] URL: [email or web address] Date: 08/25/2020 Prepared by: Mady Haagensen  Exercises Seated Active Hip Flexion - 1-2 x daily - 5 x weekly - 1-2 sets - 10 reps - 3-5 sec hold Seated March - 1-2 x daily - 5 x weekly - 1-2 sets - 10 reps Seated Long Arc Quad - 1-2 x daily - 5 x weekly - 1-2 sets - 10 reps Seated Ankle Dorsiflexion AROM - 1-2 x daily - 5 x weekly - 1-2 sets - 10 reps Sit to Stand with Armchair - 1 x daily - 5 x weekly - 1-2 sets - 5 reps   Performed additional 2-3 reps of each, with HEP printed pictures provided      [facility] Adult PT Treatment/Exercise - 08/25/20 0001       Transfers   Transfers Sit to Stand;Stand to Sit    Sit to Stand 5: Supervision;6: Modified independent (Device/Increase time);Without upper extremity assist;From bed;With upper extremity assist;From chair/3-in-1    Stand to Sit 5:  Supervision    Number of Reps 1 set;Other reps (comment)   5 reps from mat; at least another 6-8 reps from chair/mat through session   Comments Supervision/mod I from mat, supervision from chair with UE support      Ambulation/Gait   Ambulation/Gait Yes    Ambulation/Gait Assistance 4: Min guard    Ambulation Distance (Feet) 80 Feet   x 2   Assistive device Rolling walker    Gait Pattern Step-through pattern;Decreased step length - right;Decreased step length - left;Ataxic;Decreased hip/knee flexion - left;Decreased hip/knee flexion - right;Decreased dorsiflexion - left;Wide base of support    Ambulation Surface Level;Indoor    Gait Comments Tactile cues given through hips for lateral weightshifting, verbal cues for posture and increased R foot clearance/step length.  Pt able to be more consistent with step-through pattern  with cues.      Standardized Balance Assessment   Standardized Balance Assessment Timed Up and Go Test      Timed Up and Go Test   TUG Normal TUG    Normal TUG (seconds) 76.35   76.69,86.06 sec; increased time with turning to sit                   PT Education - 08/25/20 1519     Education Details HEP updates (unaware of HEP 08/02/2020, as not in pt education/instructions section) initiated-see instructions    Person(s) Educated Patient    Methods Explanation;Demonstration;Handout    Comprehension Verbalized understanding;Returned demonstration              PT Short Term Goals - 08/25/20 1253       PT SHORT TERM GOAL #1   Title Pt will perform HEP with family supervision for improved strength, balance, transfers, and gait.  TARGET 08/19/2020 (delayed one week due to [facility] scheduling)    Baseline no current HEP; HEP issued 08/25/2020    Time 4    Period Weeks    Status On-going      PT SHORT TERM GOAL #2   Title Pt will perform sit<>stand 5 of 5 trials, mod independently and no LOB upon standing to demonstrate improved functional strength and transfer efficiency.    Baseline Supervision /mod I for sit<>stand    Time 4    Period Weeks    Status Partially Met      PT SHORT TERM GOAL #3   Title Berg Balance score to be assessed and LTG to be written to address balance/decreased fall risk.    Baseline 08/16/20: 30/56 scored today as baseline score with primary PT to update goals    Time 4    Period Weeks    Status Achieved      PT SHORT TERM GOAL #4   Title Pt will improve TUG score to less than or qual to 55 seconds for decreased fall risk.    Baseline 72.91 sec; 77 sec 08/25/20    Time 4    Period Weeks    Status Not Met      PT SHORT TERM GOAL #5   Title Pt/family will verbalize understanding of fall prevention in home environment.    Baseline Pt at fall risk per TUG and gait velocity scores; 6/30:  not able to address today due to time constraints     Time 4    Period Weeks    Status On-going               PT Long Term Goals -  07/12/20 1742       PT LONG TERM GOAL #1   Title Pt will perform progression of HEP with family supervision for improved strength, balance, transfers, and gait.  TARGET 09/23/2020    Baseline no current HEP    Time 9    Period Weeks    Status New      PT LONG TERM GOAL #2   Title Pt will improve gait velocity to at least 1.3 ft/sec for improved gait efficiency and safety.    Baseline 0.96 ft/sec with RW    Time 9    Period Weeks    Status New      PT LONG TERM GOAL #3   Title Pt will improve Berg score by at least 10 points from baseline to decrease fall risk.    Baseline Pt at risk for falls per TUG and gait velocity, heavy reliance on RW in standing; Berg not completed at eval due to time constraints    Time 9    Period Weeks      PT LONG TERM GOAL #4   Title Pt will improve TUG score to less than or equal to 45 sec for decreased fall risk.    Baseline 72.91 RW at eval    Time 9    Period Weeks    Status New      PT LONG TERM GOAL #5   Title Pt will ambulate at least 50-100 ft, short household distances, with least restrictive assistive device and supervision, for improved independence with gait.    Baseline RW 80 ft at eval with supervision    Time 9    Period Weeks    Status New                   Plan - 08/25/20 1714     Clinical Impression Statement Skilled PT session today focused on assessing STGs as well as updating pt's HEP.  This PT unaware pt had HEP, as pt did not mention it specifically and not notes in education section of chart from previous visits.  Added exercises to HEP this visit to address lower extremity strength and posture.  STG 1 ongoing, STG 2 partially met for improved sit<>stand, STG 3 met for Berg performed.  STG 4 not met, with TUG score near the same as evaluation.  STG 5 ongoing, as time constraints limitied education on fall prevention today.  Pt  reports and mom concurs that pt is getting stronger and moving better.  PT notes improved step-through pattern with gait (with verbal cues), but continued difficulty/increased time with turns.  He will continue to benefit from skilled PT to work towards [place] for improved overall funcitonal mobility.    Personal Factors and Comorbidities Comorbidity 3+    Comorbidities HTN, DM, schizophrenia    Examination-Activity Limitations Locomotion Level;Transfers;Stairs;Stand    Examination-Participation Restrictions Community Activity;Other   sports   Stability/Clinical Decision Making Evolving/Moderate complexity    Rehab Potential Good    PT Frequency Other (comment)   2x/wk for 3 weeks, then 1x/wk for 6 weeks   PT Duration Other (comment)   9 wk POC   PT Treatment/Interventions ADLs/Self Care Home Management;Gait training;Stair training;Functional mobility training;Therapeutic activities;Therapeutic exercise;Balance training;Neuromuscular re-education;DME Instruction;Electrical Stimulation;Manual techniques;Orthotic Fit/Training;Patient/family education    PT Next Visit Plan Discuss fall prevention to check STG 5.  Review HEP updates (to check STG 1).*Pt needs OT appts scheduled.  Delma Freeze says 2x/wk x 6 weeks and then OT needs to  resubmit for Medicaid).    Lower extremity strength (weightbearing to break up extensor tone-sit<>stand, squats?quadruped), standing balance exercises; work on aerobic machines (maybe at [employer] session; work in parallel bars and progress to gait with lesser device as safely appropriate    PT Home Exercise Plan Access Code [email or web address]; additional Medbridge Access Code: [facility] 08/25/20    Consulted and Agree with Plan of Care Patient;Family member/caregiver    Family Member Consulted Mom             Patient will benefit from skilled therapeutic intervention in order to improve the following deficits and impairments:  Abnormal gait, Difficulty walking, Decreased balance,  Impaired flexibility, Decreased mobility, Decreased strength, Impaired tone  Visit Diagnosis: Muscle weakness (generalized)  Unsteadiness on feet  Other abnormalities of gait and mobility     Problem List Patient Active Problem List   Diagnosis Date Noted   Postural dizziness with presyncope 06/08/2020   Hypercalcemia 06/08/2020   Hypertension    Chronic diastolic CHF (congestive heart failure) (HCC)    Abnormality of gait 05/19/2020   Visual disturbance    Acute blood loss anemia    Schizophrenia (HCC)    Elevated triglycerides with high cholesterol    Controlled type 2 diabetes mellitus with hyperglycemia (HCC)    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[place]) 03/15/2020   Pressure injury of skin 48/54/6270   Toxic metabolic encephalopathy    AKI (acute kidney injury) ([place])    Dehydration    Pancreatitis    Respiratory failure ([place])    DKA (diabetic ketoacidosis) ([place]) 01/25/2020   MDD (major depressive disorder), recurrent, severe, with psychosis ([place]) 03/12/2018   Paranoid schizophrenia ([place])    Undifferentiated schizophrenia ([place])    Delusional disorder ([place])    Illiterate 10/30/2011   Schizoaffective disorder, depressive type ([place]) 10/27/2011   Schizoaffective disorder ([place]) 08/10/2011   Observed seizure-like activity ([place]) 08/09/2011   PAIN IN JOINT, MULTIPLE SITES 02/16/2010   FATIGUE 02/16/2010   Shortness of breath 02/16/2010    Yarel Rushlow W. 08/25/2020, 5:24 PM Frazier Butt., PT   [facility] [address] [place], [place], [address] Phone: [phone]   Fax:  [phone]  Name: Zakee Deerman MRN: [id number] Date of Birth: 1976-01-06

## 2020-08-30 ENCOUNTER — Ambulatory Visit: Payer: Medicaid Other | Attending: Internal Medicine | Admitting: Physical Therapy

## 2020-08-30 DIAGNOSIS — M25612 Stiffness of left shoulder, not elsewhere classified: Secondary | ICD-10-CM | POA: Insufficient documentation

## 2020-08-30 DIAGNOSIS — R41840 Attention and concentration deficit: Secondary | ICD-10-CM | POA: Insufficient documentation

## 2020-08-30 DIAGNOSIS — R278 Other lack of coordination: Secondary | ICD-10-CM | POA: Insufficient documentation

## 2020-08-30 DIAGNOSIS — R2689 Other abnormalities of gait and mobility: Secondary | ICD-10-CM | POA: Insufficient documentation

## 2020-08-30 DIAGNOSIS — M25611 Stiffness of right shoulder, not elsewhere classified: Secondary | ICD-10-CM | POA: Insufficient documentation

## 2020-08-30 DIAGNOSIS — M6281 Muscle weakness (generalized): Secondary | ICD-10-CM | POA: Insufficient documentation

## 2020-08-30 DIAGNOSIS — R2681 Unsteadiness on feet: Secondary | ICD-10-CM | POA: Insufficient documentation

## 2020-09-01 ENCOUNTER — Encounter: Payer: Self-pay | Admitting: Physical Therapy

## 2020-09-01 ENCOUNTER — Ambulatory Visit: Payer: Medicaid Other | Admitting: Physical Therapy

## 2020-09-01 ENCOUNTER — Other Ambulatory Visit: Payer: Self-pay

## 2020-09-01 DIAGNOSIS — R278 Other lack of coordination: Secondary | ICD-10-CM | POA: Diagnosis present

## 2020-09-01 DIAGNOSIS — R2689 Other abnormalities of gait and mobility: Secondary | ICD-10-CM

## 2020-09-01 DIAGNOSIS — R41840 Attention and concentration deficit: Secondary | ICD-10-CM | POA: Diagnosis present

## 2020-09-01 DIAGNOSIS — M25612 Stiffness of left shoulder, not elsewhere classified: Secondary | ICD-10-CM | POA: Diagnosis present

## 2020-09-01 DIAGNOSIS — M25611 Stiffness of right shoulder, not elsewhere classified: Secondary | ICD-10-CM | POA: Diagnosis present

## 2020-09-01 DIAGNOSIS — M6281 Muscle weakness (generalized): Secondary | ICD-10-CM

## 2020-09-01 DIAGNOSIS — R2681 Unsteadiness on feet: Secondary | ICD-10-CM | POA: Diagnosis present

## 2020-09-01 NOTE — Patient Instructions (Addendum)
Access Code: [email or web address] URL: [email or web address] Date: 09/01/2020 Prepared by: Lonia Blood  Exercises Seated Active Hip Flexion - 1-2 x daily - 5 x weekly - 1-2 sets - 10 reps - 3-5 sec hold Seated March - 1-2 x daily - 5 x weekly - 1-2 sets - 10 reps Seated Long Arc Quad - 1-2 x daily - 5 x weekly - 1-2 sets - 10 reps Seated Ankle Dorsiflexion AROM - 1-2 x daily - 5 x weekly - 1-2 sets - 10 reps Sit to Stand with Armchair - 1 x daily - 5 x weekly - 1-2 sets - 5 reps  Additions 09/01/2020: Supine March - 1 x daily - 5 x weekly - 1-2 sets - 10 reps Supine Bridge - 1 x daily - 5 x weekly - 1-2 sets - 10 reps

## 2020-09-01 NOTE — Therapy (Signed)
[facility] [address] [place], [place], [address] Phone: [phone]   Fax:  [phone]  Physical Therapy Treatment  Patient Details  Name: Bryan [name] MRN: [id number] Date of Birth: [birth year] Referring Provider (PT): [name]   Encounter Date: 09/01/2020   PT End of Session - 09/01/20 1239     Visit Number 6    Number of Visits 13    Authorization Type [place] requested upon completion of eval    Authorization Time Period 12  visits from 08/02/20 through 10/03/20    Authorization - Visit Number 5    Authorization - Number of Visits 12    PT Start Time 2924    PT Stop Time 1321    PT Time Calculation (min) 47 min    Equipment Utilized During Treatment Gait belt    Activity Tolerance Patient tolerated treatment well    Behavior During Therapy Flat affect;WFL for tasks assessed/performed             Past Medical History:  Diagnosis Date   Controlled type 2 diabetes mellitus with hyperglycemia ([place])    Hypertension    Psychiatric problem    Seen at [facility] for unknown reason. Takes seroquel. History of hearing voices.  Not commandivng voices.    Schizophrenia ([place])    Tremors of nervous system     Past Surgical History:  Procedure Laterality Date   none     TRACHEOSTOMY TUBE PLACEMENT N/A 02/13/2020   Procedure: TRACHEOSTOMY;  Surgeon: Leta Baptist, MD;  Location: [facility] OR;  Service: ENT;  Laterality: N/A;    There were no vitals filed for this visit.   Subjective Assessment - 09/01/20 1800     Subjective No changes; no falls.    Patient is accompained by: Family member   mom, [name] in session. brother [name] in lobby   Pertinent History Male with history of HTN, schizophrenia who was admitted 01/25/2020 with hypotension, lethargy and found to have newly diagnosed DM with DKA and AKI and anoxic encephalopathy; was admitted to CIR and d/c home 2/11/ 2022; ED visit 05/2020    Limitations  Standing;Walking    Patient Stated Goals Wants to work on balance, strength, gait    Currently in Pain? No/denies                               [facility] Adult PT Treatment/Exercise - 09/01/20 0001       Transfers   Transfers Sit to Stand;Stand to Sit    Sit to Stand 5: Supervision;6: Modified independent (Device/Increase time);Without upper extremity assist;From bed;With upper extremity assist;From chair/3-in-1    Stand to Sit 5: Supervision    Number of Reps 1 set;Other reps (comment)   5 reps   Transfer Cueing Cues for glut/quad activaiton upon standing    Comments Performs x 4 additional reps throughout session      Ambulation/Gait   Ambulation/Gait Yes    Ambulation/Gait Assistance 4: Min guard    Ambulation/Gait Assistance Details Cues for upright posture to decrease UE reliance through RW; cues for consistent, continuous step through pattern    Ambulation Distance (Feet) 100 Feet   80   Assistive device Rolling walker    Gait Pattern Step-through pattern;Decreased step length - right;Decreased step length - left;Ataxic;Decreased hip/knee flexion - left;Decreased hip/knee flexion - right;Decreased dorsiflexion - left;Wide base of support    Ambulation Surface Level;Indoor  Neuro Re-ed    Neuro Re-ed Details  for strengthening/NMR: tall kneeling with bil UE support on Kaye bench- Tall kneel<>sit back on heels x 10 reps, cues/faciliation on technique/posture; even with cues, pt remains in hip flexion.  Tall kneel with UEs on Kaye bench-alt UE lifts x 5 reps to lessen UE support, cues at gluts for facilitation.  Pt requires extra time and cues to transition seated edge of mat to/from tall kneeling position.      Exercises   Exercises Knee/Hip      Knee/Hip Exercises: Supine   Bridges Strengthening;Both;1 set;10 reps    Straight Leg Raises Strengthening;Right;Left;1 set;10 reps    Straight Leg Raises Limitations Cues for slow, control motion concentric and  eccentric phases.    Other Supine Knee/Hip Exercises Hooklying marching 10 reps with assist for technique/facilitation.    Other Supine Knee/Hip Exercises Hooklying clamshell, 10 reps each side              Access Code: [email or web address] URL: [email or web address] Date: 08/25/2020 Prepared by: Mady Haagensen   Exercises-Reviewed as HEP given last visit  Seated Active Hip Flexion - 1-2 x daily - 5 x weekly - 1-2 sets - 10 reps - 3-5 sec hold Seated March - 1-2 x daily - 5 x weekly - 1-2 sets - 10 reps Seated Long Arc Quad - 1-2 x daily - 5 x weekly - 1-2 sets - 10 reps Seated Ankle Dorsiflexion AROM - 1-2 x daily - 5 x weekly - 1-2 sets - 10 reps Sit to Stand with Armchair - 1 x daily - 5 x weekly - 1-2 sets - 5 reps         PT Education - 09/01/20 1811     Education Details Additions to HEP; had long discussion with pt/mom regarding pt's POC and need to decrease PT visits to 1x/wk for next 4-5 weeks to align with POC and to help extend pt's limited visits out for longer timeframe.  Discussed that pt's HEP needs to be done consistently and may be updated as pt comes in to have pt be working hard at home to allow for optimal benefit from more challenging activities in PT sessions.  Answered multiple questions from mom regarding scheduling for PT (and for OT-which is being added back in, due to multiple therapists out sick delaying schedule.)    Person(s) Educated Patient;Parent(s)    Methods Explanation;Demonstration;Handout    Comprehension Verbalized understanding;Returned demonstration;Verbal cues required              PT Short Term Goals - 08/25/20 1253       PT SHORT TERM GOAL #1   Title Pt will perform HEP with family supervision for improved strength, balance, transfers, and gait.  TARGET 08/19/2020 (delayed one week due to [facility] scheduling)    Baseline no current HEP; HEP issued 08/25/2020    Time 4    Period Weeks    Status On-going      PT SHORT TERM  GOAL #2   Title Pt will perform sit<>stand 5 of 5 trials, mod independently and no LOB upon standing to demonstrate improved functional strength and transfer efficiency.    Baseline Supervision /mod I for sit<>stand    Time 4    Period Weeks    Status Partially Met      PT SHORT TERM GOAL #3   Title Berg Balance score to be assessed and LTG to be written to address balance/decreased fall risk.  Baseline 08/16/20: 30/56 scored today as baseline score with primary PT to update goals    Time 4    Period Weeks    Status Achieved      PT SHORT TERM GOAL #4   Title Pt will improve TUG score to less than or qual to 55 seconds for decreased fall risk.    Baseline 72.91 sec; 77 sec 08/25/20    Time 4    Period Weeks    Status Not Met      PT SHORT TERM GOAL #5   Title Pt/family will verbalize understanding of fall prevention in home environment.    Baseline Pt at fall risk per TUG and gait velocity scores; 6/30:  not able to address today due to time constraints    Time 4    Period Weeks    Status On-going               PT Long Term Goals - 07/12/20 1742       PT LONG TERM GOAL #1   Title Pt will perform progression of HEP with family supervision for improved strength, balance, transfers, and gait.  TARGET 09/23/2020    Baseline no current HEP    Time 9    Period Weeks    Status New      PT LONG TERM GOAL #2   Title Pt will improve gait velocity to at least 1.3 ft/sec for improved gait efficiency and safety.    Baseline 0.96 ft/sec with RW    Time 9    Period Weeks    Status New      PT LONG TERM GOAL #3   Title Pt will improve Berg score by at least 10 points from baseline to decrease fall risk.    Baseline Pt at risk for falls per TUG and gait velocity, heavy reliance on RW in standing; Berg not completed at eval due to time constraints    Time 9    Period Weeks      PT LONG TERM GOAL #4   Title Pt will improve TUG score to less than or equal to 45 sec for decreased  fall risk.    Baseline 72.91 RW at eval    Time 9    Period Weeks    Status New      PT LONG TERM GOAL #5   Title Pt will ambulate at least 50-100 ft, short household distances, with least restrictive assistive device and supervision, for improved independence with gait.    Baseline RW 80 ft at eval with supervision    Time 9    Period Weeks    Status New                   Plan - 09/01/20 1813     Clinical Impression Statement Focused skilled PT session on review of and progression of HEP to address lower extremity and trunk strengthening.  Pt continues with trunk/lower extremity ataxia today with transitional movements and has increased difficulty with tall knee position.  Had to spend time on discussion regarding current POC and limited visits (insurance) and benefits of decreaseing frequency to extend time PT is working with patient.  Educated pt/mom on importance of consistency with HEP to maximize time during therapy sessions.  He continues to demonstrate improvement with step through pattern with gait; he does continue to rely heavily on UE support through walker.    Personal Factors and Comorbidities Comorbidity 3+  Comorbidities HTN, DM, schizophrenia    Examination-Activity Limitations Locomotion Level;Transfers;Stairs;Stand    Examination-Participation Restrictions Community Activity;Other   sports   Stability/Clinical Decision Making Evolving/Moderate complexity    Rehab Potential Good    PT Frequency Other (comment)   2x/wk for 3 weeks, then 1x/wk for 6 weeks   PT Duration Other (comment)   9 wk POC   PT Treatment/Interventions ADLs/Self Care Home Management;Gait training;Stair training;Functional mobility training;Therapeutic activities;Therapeutic exercise;Balance training;Neuromuscular re-education;DME Instruction;Electrical Stimulation;Manual techniques;Orthotic Fit/Training;Patient/family education    PT Next Visit Plan At some point, discuss fall prevention to  check STG 5 (could not do today due to time constraints with other POC discussion).  Review HEP updates   Lower extremity strength (weightbearing to break up extensor tone-sit<>stand, squats), need to try standing exercises-resistance through hips and work on lessening UE support with standing; work on aerobic machines (maybe at [employer]; work in parallel bars and progress to gait with lesser device as safely appropriate    PT Home Exercise Plan Access Code [email or web address]; additional Medbridge Access Code: [facility] 08/25/20    Consulted and Agree with Plan of Care Patient;Family member/caregiver    Family Member Consulted Mom             Patient will benefit from skilled therapeutic intervention in order to improve the following deficits and impairments:  Abnormal gait, Difficulty walking, Decreased balance, Impaired flexibility, Decreased mobility, Decreased strength, Impaired tone  Visit Diagnosis: Muscle weakness (generalized)  Other abnormalities of gait and mobility  Unsteadiness on feet     Problem List Patient Active Problem List   Diagnosis Date Noted   Postural dizziness with presyncope 06/08/2020   Hypercalcemia 06/08/2020   Hypertension    Chronic diastolic CHF (congestive heart failure) ([facility])    Abnormality of gait 05/19/2020   Visual disturbance    Acute blood loss anemia    Schizophrenia ([facility])    Elevated triglycerides with high cholesterol    Controlled type 2 diabetes mellitus with hyperglycemia ([facility])    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[place]) 03/15/2020   Pressure injury of skin 65/78/4696   Toxic metabolic encephalopathy    AKI (acute kidney injury) ([place])    Dehydration    Pancreatitis    Respiratory failure ([place])    DKA (diabetic ketoacidosis) ([place]) 01/25/2020   MDD (major depressive disorder), recurrent, severe, with psychosis ([place]) 03/12/2018   Paranoid schizophrenia ([place])    Undifferentiated schizophrenia ([place])    Delusional disorder ([place])    Illiterate  10/30/2011   Schizoaffective disorder, depressive type ([place]) 10/27/2011   Schizoaffective disorder ([place]) 08/10/2011   Observed seizure-like activity ([place]) 08/09/2011   PAIN IN JOINT, MULTIPLE SITES 02/16/2010   FATIGUE 02/16/2010   Shortness of breath 02/16/2010    Makayia Duplessis W. 09/01/2020, 6:21 PM Frazier Butt., PT   [facility] [address] [place], [place], [address] Phone: [phone]   Fax:  [phone]  Name: Bryan [name] MRN: [id number] Date of Birth: 1975/11/30

## 2020-09-06 ENCOUNTER — Encounter: Payer: Self-pay | Admitting: Physical Therapy

## 2020-09-06 ENCOUNTER — Ambulatory Visit: Payer: Medicaid Other | Admitting: Physical Therapy

## 2020-09-06 ENCOUNTER — Other Ambulatory Visit: Payer: Self-pay

## 2020-09-06 DIAGNOSIS — M6281 Muscle weakness (generalized): Secondary | ICD-10-CM

## 2020-09-06 DIAGNOSIS — R2681 Unsteadiness on feet: Secondary | ICD-10-CM

## 2020-09-06 DIAGNOSIS — M25612 Stiffness of left shoulder, not elsewhere classified: Secondary | ICD-10-CM | POA: Diagnosis not present

## 2020-09-06 DIAGNOSIS — R2689 Other abnormalities of gait and mobility: Secondary | ICD-10-CM

## 2020-09-07 NOTE — Therapy (Signed)
[facility] [address] [place], [place], [address] Phone: [phone]   Fax:  [phone]  Physical Therapy Treatment  Patient Details  Name: Bryan [name] MRN: [id number] Date of Birth: 1975-11-18 Referring Provider (PT): [name]   Encounter Date: 09/06/2020   PT End of Session - 09/06/20 7026     Visit Number 7    Number of Visits 13    Authorization Type [place] requested upon completion of eval    Authorization Time Period 12  visits from 08/02/20 through 10/03/20    Authorization - Visit Number 6    Authorization - Number of Visits 12    PT Start Time 3785   pt brought back while mom waited to sign him in   PT Stop Time 1315    PT Time Calculation (min) 39 min    Equipment Utilized During Treatment Gait belt    Activity Tolerance Patient tolerated treatment well    Behavior During Therapy Flat affect;WFL for tasks assessed/performed             Past Medical History:  Diagnosis Date   Controlled type 2 diabetes mellitus with hyperglycemia ([place])    Hypertension    Psychiatric problem    Seen at [facility] for unknown reason. Takes seroquel. History of hearing voices.  Not commandivng voices.    Schizophrenia ([place])    Tremors of nervous system     Past Surgical History:  Procedure Laterality Date   none     TRACHEOSTOMY TUBE PLACEMENT N/A 02/13/2020   Procedure: TRACHEOSTOMY;  Surgeon: Leta Baptist, MD;  Location: [facility] OR;  Service: ENT;  Laterality: N/A;    There were no vitals filed for this visit.   Subjective Assessment - 09/06/20 1243     Subjective No new complaitns. No falls to report. Having some right arm soreness when he moves a certain way- OT addressing    Patient is accompained by: Family member   [name] in session   Pertinent History Male with history of HTN, schizophrenia who was admitted 01/25/2020 with hypotension, lethargy and found to have newly diagnosed DM with DKA and AKI and  anoxic encephalopathy; was admitted to CIR and d/c home 2/11/ 2022; ED visit 05/2020    Limitations Standing;Walking    Patient Stated Goals Wants to work on balance, strength, gait    Currently in Pain? No/denies                  [facility] Adult PT Treatment/Exercise - 09/06/20 1245       Transfers   Transfers Sit to Stand;Stand to Sit    Sit to Stand 5: Supervision;Without upper extremity assist;From bed    Stand to Sit 5: Supervision;Without upper extremity assist;To bed    Number of Reps 10 reps;2 sets    Comments cues to scoot to edge of mat, for anterior weight shiftng, tall standing and controlled descent with sitting back down.      Ambulation/Gait   Ambulation/Gait Yes    Ambulation/Gait Assistance 5: Supervision;4: Min guard    Ambulation/Gait Assistance Details pt able to demo increased bil step length with gait from lobby into clinic with minimal cueing needed this time.    Assistive device Rolling walker    Gait Pattern Step-through pattern;Decreased step length - right;Decreased step length - left;Ataxic;Decreased hip/knee flexion - left;Decreased hip/knee flexion - right;Decreased dorsiflexion - left;Wide base of support    Ambulation Surface Level;Indoor      Neuro  Re-ed    Neuro Re-ed Details  for strengthening/NMR: in tall kneeling with Ulis Rias bench- working on tall posture with bil hip extension- then progressing to lateral weight shfiting with emphasis on staying tall for ~7-8 reps each way. then while staying tall- alternating UE raises for ~6 reps each side. after a brief seated rest break returned to tall kneeling for mini squats x 10 reps, then in tall posture alterating moving knee out/in for 10 reps each side, bil UE support with min guard assist for safety, cues on form and technique. min guard to min assist with cues on sequencing to get into and out of tall kneeling both times; standing with RW- alternating foot taps to 4 inch box with emphasis on hip/knee  flexion and step/tap placement for 10 reps on each leg. min guard to min assist for balance with cues on technique.                      PT Short Term Goals - 08/25/20 1253       PT SHORT TERM GOAL #1   Title Pt will perform HEP with family supervision for improved strength, balance, transfers, and gait.  TARGET 08/19/2020 (delayed one week due to [facility] scheduling)    Baseline no current HEP; HEP issued 08/25/2020    Time 4    Period Weeks    Status On-going      PT SHORT TERM GOAL #2   Title Pt will perform sit<>stand 5 of 5 trials, mod independently and no LOB upon standing to demonstrate improved functional strength and transfer efficiency.    Baseline Supervision /mod I for sit<>stand    Time 4    Period Weeks    Status Partially Met      PT SHORT TERM GOAL #3   Title Berg Balance score to be assessed and LTG to be written to address balance/decreased fall risk.    Baseline 08/16/20: 30/56 scored today as baseline score with primary PT to update goals    Time 4    Period Weeks    Status Achieved      PT SHORT TERM GOAL #4   Title Pt will improve TUG score to less than or qual to 55 seconds for decreased fall risk.    Baseline 72.91 sec; 77 sec 08/25/20    Time 4    Period Weeks    Status Not Met      PT SHORT TERM GOAL #5   Title Pt/family will verbalize understanding of fall prevention in home environment.    Baseline Pt at fall risk per TUG and gait velocity scores; 6/30:  not able to address today due to time constraints    Time 4    Period Weeks    Status On-going               PT Long Term Goals - 07/12/20 1742       PT LONG TERM GOAL #1   Title Pt will perform progression of HEP with family supervision for improved strength, balance, transfers, and gait.  TARGET 09/23/2020    Baseline no current HEP    Time 9    Period Weeks    Status New      PT LONG TERM GOAL #2   Title Pt will improve gait velocity to at least 1.3 ft/sec for improved  gait efficiency and safety.    Baseline 0.96 ft/sec with RW    Time 9  Period Weeks    Status New      PT LONG TERM GOAL #3   Title Pt will improve Berg score by at least 10 points from baseline to decrease fall risk.    Baseline Pt at risk for falls per TUG and gait velocity, heavy reliance on RW in standing; Berg not completed at eval due to time constraints    Time 9    Period Weeks      PT LONG TERM GOAL #4   Title Pt will improve TUG score to less than or equal to 45 sec for decreased fall risk.    Baseline 72.91 RW at eval    Time 9    Period Weeks    Status New      PT LONG TERM GOAL #5   Title Pt will ambulate at least 50-100 ft, short household distances, with least restrictive assistive device and supervision, for improved independence with gait.    Baseline RW 80 ft at eval with supervision    Time 9    Period Weeks    Status New                   Plan - 09/06/20 1244     Clinical Impression Statement Today's skilled session continued to focus on strengthening, weight shifitng, gait and standing balance with no issues noted. Rest breaks taken as needed due to fatigue. The pt is progressing toward goals and should benefit from continued PT to progress toward unmet goals.    Personal Factors and Comorbidities Comorbidity 3+    Comorbidities HTN, DM, schizophrenia    Examination-Activity Limitations Locomotion Level;Transfers;Stairs;Stand    Examination-Participation Restrictions Community Activity;Other   sports   Stability/Clinical Decision Making Evolving/Moderate complexity    Rehab Potential Good    PT Frequency Other (comment)   2x/wk for 3 weeks, then 1x/wk for 6 weeks   PT Duration Other (comment)   9 wk POC   PT Treatment/Interventions ADLs/Self Care Home Management;Gait training;Stair training;Functional mobility training;Therapeutic activities;Therapeutic exercise;Balance training;Neuromuscular re-education;DME Instruction;Electrical  Stimulation;Manual techniques;Orthotic Fit/Training;Patient/family education    PT Next Visit Plan At some point, discuss fall prevention to check STG 5 (could not do today due to time constraints with other POC discussion).  Review HEP updates   Lower extremity strength (weightbearing to break up extensor tone-sit<>stand, squats), need to try standing exercises-resistance through hips and work on lessening UE support with standing; work on aerobic machines (maybe at [employer]; work in parallel bars and progress to gait with lesser device as safely appropriate    PT Home Exercise Plan Access Code [email or web address]; additional Medbridge Access Code: [facility] 08/25/20    Consulted and Agree with Plan of Care Patient;Family member/caregiver    Family Member Consulted Mom             Patient will benefit from skilled therapeutic intervention in order to improve the following deficits and impairments:  Abnormal gait, Difficulty walking, Decreased balance, Impaired flexibility, Decreased mobility, Decreased strength, Impaired tone  Visit Diagnosis: Muscle weakness (generalized)  Other abnormalities of gait and mobility  Unsteadiness on feet     Problem List Patient Active Problem List   Diagnosis Date Noted   Postural dizziness with presyncope 06/08/2020   Hypercalcemia 06/08/2020   Hypertension    Chronic diastolic CHF (congestive heart failure) (HCC)    Abnormality of gait 05/19/2020   Visual disturbance    Acute blood loss anemia    Schizophrenia ([facility])  Elevated triglycerides with high cholesterol    Controlled type 2 diabetes mellitus with hyperglycemia ([facility])    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[place]) 03/15/2020   Pressure injury of skin 14/70/9295   Toxic metabolic encephalopathy    AKI (acute kidney injury) ([place])    Dehydration    Pancreatitis    Respiratory failure ([place])    DKA (diabetic ketoacidosis) ([place]) 01/25/2020   MDD (major depressive disorder), recurrent, severe, with  psychosis ([place]) 03/12/2018   Paranoid schizophrenia ([place])    Undifferentiated schizophrenia ([place])    Delusional disorder ([place])    Illiterate 10/30/2011   Schizoaffective disorder, depressive type ([place]) 10/27/2011   Schizoaffective disorder ([place]) 08/10/2011   Observed seizure-like activity ([place]) 08/09/2011   PAIN IN JOINT, MULTIPLE SITES 02/16/2010   FATIGUE 02/16/2010   Shortness of breath 02/16/2010    Willow Ora, PTA, [facility] [address] [place], [place] [phone] 09/07/20, 9:51 AM   Name: Bryan [name] MRN: [id number] Date of Birth: [birth year]

## 2020-09-08 ENCOUNTER — Ambulatory Visit: Payer: Medicaid Other | Admitting: Physical Therapy

## 2020-09-13 ENCOUNTER — Ambulatory Visit: Payer: Medicaid Other | Admitting: Physical Therapy

## 2020-09-13 ENCOUNTER — Ambulatory Visit: Payer: Medicaid Other | Admitting: Occupational Therapy

## 2020-09-13 ENCOUNTER — Other Ambulatory Visit: Payer: Self-pay

## 2020-09-13 ENCOUNTER — Encounter: Payer: Self-pay | Admitting: Physical Therapy

## 2020-09-13 DIAGNOSIS — M6281 Muscle weakness (generalized): Secondary | ICD-10-CM

## 2020-09-13 DIAGNOSIS — M25611 Stiffness of right shoulder, not elsewhere classified: Secondary | ICD-10-CM

## 2020-09-13 DIAGNOSIS — R2681 Unsteadiness on feet: Secondary | ICD-10-CM

## 2020-09-13 DIAGNOSIS — R2689 Other abnormalities of gait and mobility: Secondary | ICD-10-CM

## 2020-09-13 DIAGNOSIS — R278 Other lack of coordination: Secondary | ICD-10-CM

## 2020-09-13 DIAGNOSIS — M25612 Stiffness of left shoulder, not elsewhere classified: Secondary | ICD-10-CM | POA: Diagnosis not present

## 2020-09-13 NOTE — Therapy (Signed)
[facility] [address] [place], [place], [address] Phone: [phone]   Fax:  [phone]  Physical Therapy Treatment  Patient Details  Name: Bryan [name] MRN: [id number] Date of Birth: 1975/07/02 Referring Provider (PT): [name]   Encounter Date: 09/13/2020   PT End of Session - 09/13/20 1239     Visit Number 8    Number of Visits 13    Authorization Type [place] requested upon completion of eval    Authorization Time Period 12  visits from 08/02/20 through 10/03/20    Authorization - Visit Number 7    Authorization - Number of Visits 12    PT Start Time 6553    PT Stop Time 1315    PT Time Calculation (min) 42 min    Equipment Utilized During Treatment Gait belt    Activity Tolerance Patient tolerated treatment well    Behavior During Therapy Flat affect;WFL for tasks assessed/performed             Past Medical History:  Diagnosis Date   Controlled type 2 diabetes mellitus with hyperglycemia ([place])    Hypertension    Psychiatric problem    Seen at [facility] for unknown reason. Takes seroquel. History of hearing voices.  Not commandivng voices.    Schizophrenia ([place])    Tremors of nervous system     Past Surgical History:  Procedure Laterality Date   none     TRACHEOSTOMY TUBE PLACEMENT N/A 02/13/2020   Procedure: TRACHEOSTOMY;  Surgeon: Leta Baptist, MD;  Location: [facility] OR;  Service: ENT;  Laterality: N/A;    There were no vitals filed for this visit.   Subjective Assessment - 09/13/20 1238     Subjective No new complatins. No falls or pain to report.    Patient is accompained by: Family member   [name] and brother in lobby   Pertinent History Male with history of HTN, schizophrenia who was admitted 01/25/2020 with hypotension, lethargy and found to have newly diagnosed DM with DKA and AKI and anoxic encephalopathy; was admitted to [facility] and d/c home 2/11/ 2022; ED visit 05/2020    Limitations  Standing;Walking    Patient Stated Goals Wants to work on balance, strength, gait    Currently in Pain? No/denies                   [facility] Adult PT Treatment/Exercise - 09/13/20 1242       Transfers   Transfers Sit to Stand;Stand to Sit    Sit to Stand 5: Supervision;From bed;With upper extremity assist    Stand to Sit 5: Supervision;With upper extremity assist;To bed    Comments min assist to turn and go from standing into tall kneeling on mat table with cues on sequencing needed. then mod assist to go from tall kneeling into sitting on mat table with cues on sequencing and assist for weight shifitng to move pelvis.      Ambulation/Gait   Ambulation/Gait Yes    Ambulation/Gait Assistance 5: Supervision;4: Min guard    Ambulation/Gait Assistance Details cues needed for more upright posture (hip and trunk extension) and for increased step/stride length. Pt able to return for a few steps before going back to short, shuffled steps.    Ambulation Distance (Feet) 100 Feet   x1, 30 x2   Assistive device Rolling walker    Gait Pattern Step-through pattern;Decreased step length - right;Decreased step length - left;Ataxic;Decreased hip/knee flexion - left;Decreased hip/knee flexion - right;Decreased  dorsiflexion - left;Wide base of support    Ambulation Surface Level;Indoor      Neuro Re-ed    Neuro Re-ed Details  for strengthening/NMR: tall kneeling with UE support on Kay bench- with emphasis on tall posture for alternating UE raises in pain free ranges, then mini squats for 10 reps. min guard to min assist with cues for posture/ex form; standing with RW support- with 6 inch box for alternating foot taps for 10 rep each side. cues/facilitaiton for tall posture and increased hip/knee flexion. then with yard stick on floor- alternating forward/backward stepping over the yard stick for 7-8 reps each side, cues for increased step lenght/height with this activity. min guard to min assist for  balance.      Knee/Hip Exercises: Aerobic   Other Aerobic Scifit level  3.5 x 8 minutes with UE/LEs. emphasis placed on full range of motion for stretching                      PT Short Term Goals - 08/25/20 1253       PT SHORT TERM GOAL #1   Title Pt will perform HEP with family supervision for improved strength, balance, transfers, and gait.  TARGET 08/19/2020 (delayed one week due to [facility] scheduling)    Baseline no current HEP; HEP issued 08/25/2020    Time 4    Period Weeks    Status On-going      PT SHORT TERM GOAL #2   Title Pt will perform sit<>stand 5 of 5 trials, mod independently and no LOB upon standing to demonstrate improved functional strength and transfer efficiency.    Baseline Supervision /mod I for sit<>stand    Time 4    Period Weeks    Status Partially Met      PT SHORT TERM GOAL #3   Title Berg Balance score to be assessed and LTG to be written to address balance/decreased fall risk.    Baseline 08/16/20: 30/56 scored today as baseline score with primary PT to update goals    Time 4    Period Weeks    Status Achieved      PT SHORT TERM GOAL #4   Title Pt will improve TUG score to less than or qual to 55 seconds for decreased fall risk.    Baseline 72.91 sec; 77 sec 08/25/20    Time 4    Period Weeks    Status Not Met      PT SHORT TERM GOAL #5   Title Pt/family will verbalize understanding of fall prevention in home environment.    Baseline Pt at fall risk per TUG and gait velocity scores; 6/30:  not able to address today due to time constraints    Time 4    Period Weeks    Status On-going               PT Long Term Goals - 07/12/20 1742       PT LONG TERM GOAL #1   Title Pt will perform progression of HEP with family supervision for improved strength, balance, transfers, and gait.  TARGET 09/23/2020    Baseline no current HEP    Time 9    Period Weeks    Status New      PT LONG TERM GOAL #2   Title Pt will improve gait  velocity to at least 1.3 ft/sec for improved gait efficiency and safety.    Baseline 0.96 ft/sec with RW  Time 9    Period Weeks    Status New      PT LONG TERM GOAL #3   Title Pt will improve Berg score by at least 10 points from baseline to decrease fall risk.    Baseline Pt at risk for falls per TUG and gait velocity, heavy reliance on RW in standing; Berg not completed at eval due to time constraints    Time 9    Period Weeks      PT LONG TERM GOAL #4   Title Pt will improve TUG score to less than or equal to 45 sec for decreased fall risk.    Baseline 72.91 RW at eval    Time 9    Period Weeks    Status New      PT LONG TERM GOAL #5   Title Pt will ambulate at least 50-100 ft, short household distances, with least restrictive assistive device and supervision, for improved independence with gait.    Baseline RW 80 ft at eval with supervision    Time 9    Period Weeks    Status New                   Plan - 09/13/20 1240     Clinical Impression Statement Today's skilled session continued to focus on strenthening, NMR and gait training with rest breaks taken as needed. No other issues noted or reported in session. The pt is making steady progress and should benefit from continued PT to progress toward unmet goals.    Personal Factors and Comorbidities Comorbidity 3+    Comorbidities HTN, DM, schizophrenia    Examination-Activity Limitations Locomotion Level;Transfers;Stairs;Stand    Examination-Participation Restrictions Community Activity;Other   sports   Stability/Clinical Decision Making Evolving/Moderate complexity    Rehab Potential Good    PT Frequency Other (comment)   2x/wk for 3 weeks, then 1x/wk for 6 weeks   PT Duration Other (comment)   9 wk POC   PT Treatment/Interventions ADLs/Self Care Home Management;Gait training;Stair training;Functional mobility training;Therapeutic activities;Therapeutic exercise;Balance training;Neuromuscular re-education;DME  Instruction;Electrical Stimulation;Manual techniques;Orthotic Fit/Training;Patient/family education    PT Next Visit Plan At some point, discuss fall prevention to check STG 5 (could not do today due to time constraints with other POC discussion).  Review HEP updates   Lower extremity strength (weightbearing to break up extensor tone-sit<>stand, squats), need to try standing exercises-resistance through hips and work on lessening UE support with standing; work on aerobic machines (maybe at [employer]; work in parallel bars and progress to gait with lesser device as safely appropriate    PT Home Exercise Plan Access Code [email or web address]; additional Medbridge Access Code: [facility] 08/25/20    Consulted and Agree with Plan of Care Patient;Family member/caregiver    Family Member Consulted Mom             Patient will benefit from skilled therapeutic intervention in order to improve the following deficits and impairments:  Abnormal gait, Difficulty walking, Decreased balance, Impaired flexibility, Decreased mobility, Decreased strength, Impaired tone  Visit Diagnosis: Muscle weakness (generalized)  Other abnormalities of gait and mobility  Unsteadiness on feet     Problem List Patient Active Problem List   Diagnosis Date Noted   Postural dizziness with presyncope 06/08/2020   Hypercalcemia 06/08/2020   Hypertension    Chronic diastolic CHF (congestive heart failure) (HCC)    Abnormality of gait 05/19/2020   Visual disturbance    Acute blood loss anemia  Schizophrenia ([place])    Elevated triglycerides with high cholesterol    Controlled type 2 diabetes mellitus with hyperglycemia (HCC)    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[place]) 03/15/2020   Pressure injury of skin 30/08/6224   Toxic metabolic encephalopathy    AKI (acute kidney injury) ([place])    Dehydration    Pancreatitis    Respiratory failure ([place])    DKA (diabetic ketoacidosis) ([place]) 01/25/2020   MDD (major depressive disorder),  recurrent, severe, with psychosis ([place]) 03/12/2018   Paranoid schizophrenia ([place])    Undifferentiated schizophrenia ([place])    Delusional disorder ([place])    Illiterate 10/30/2011   Schizoaffective disorder, depressive type ([place]) 10/27/2011   Schizoaffective disorder ([place]) 08/10/2011   Observed seizure-like activity ([place]) 08/09/2011   PAIN IN JOINT, MULTIPLE SITES 02/16/2010   FATIGUE 02/16/2010   Shortness of breath 02/16/2010    Willow Ora, PTA, [facility] [address], [place], [place] [address] [phone] 09/13/20, 3:24 PM   Name: Bryan [name] MRN: [id number] Date of Birth: Jul 03, 1975

## 2020-09-13 NOTE — Therapy (Signed)
[facility] [address] [place], [place], [address] Phone: [phone]   Fax:  [phone]  Occupational Therapy Treatment  Patient Details  Name: Bryan [name] MRN: [id number] Date of Birth: December 24, 1975 Referring Provider (OT): Dr. Concepcion Elk   Encounter Date: 09/13/2020   OT End of Session - 09/13/20 1558     Visit Number 4    Number of Visits 15    Date for OT Re-Evaluation 10/21/20    Authorization Type MCD - approved 3 visits from 5/31 - 08/15/20    Authorization - Number of Visits --    Progress Note Due on Visit 3    OT Start Time 1325   Pt spent first 10 min using bathroom   OT Stop Time 1400    OT Time Calculation (min) 35 min    Activity Tolerance Patient tolerated treatment well             Past Medical History:  Diagnosis Date   Controlled type 2 diabetes mellitus with hyperglycemia (HCC)    Hypertension    Psychiatric problem    Seen at [facility] for unknown reason. Takes seroquel. History of hearing voices.  Not commandivng voices.    Schizophrenia (HCC)    Tremors of nervous system     Past Surgical History:  Procedure Laterality Date   none     TRACHEOSTOMY TUBE PLACEMENT N/A 02/13/2020   Procedure: TRACHEOSTOMY;  Surgeon: Newman Pies, MD;  Location: [facility] OR;  Service: ENT;  Laterality: N/A;    There were no vitals filed for this visit.   Subjective Assessment - 09/13/20 1320     Subjective  Pt returns to O.T. after lapse in therapy    Patient is accompanied by: Family member   mother   Pertinent History Went to ED 01/25/20 with DKA and AKI w/ newly diagnosed DM. Pt developed metabolic encephalopathy and anoxic brain damage as a result. PMH: schizoaffective disorder, HTN    Limitations fall risk,    Currently in Pain? No/denies              See below education                    OT Education - 09/13/20 1602     Education Details Review of shoulder and  coordination HEP    Person(s) Educated Patient;Parent(s)    Methods Explanation;Demonstration;Verbal cues    Comprehension Verbalized understanding;Returned demonstration;Verbal cues required              OT Short Term Goals - 08/02/20 1439       OT SHORT TERM GOAL #1   Title Independent with bilateral shoulder and coordination HEP    Baseline dependent    Time 4    Period Weeks    Status On-going      OT SHORT TERM GOAL #2   Title Pt to write full name at 75% or greater legibility and cut food w/ A/E PRN    Baseline 50% first name only, dependent for cutting food    Time 4    Period Weeks    Status On-going      OT SHORT TERM GOAL #3   Title Pt to perform all BADLS at mod I level    Baseline requires set up/supervision    Time 4    Period Weeks    Status On-going      OT SHORT TERM GOAL #4   Title Pt/family to verbalize  understanding with memory strategies    Baseline dependent    Time 4    Period Weeks    Status New      OT SHORT TERM GOAL #5   Title Pt to improve coordination bilaterally as evidenced by reducing speed on 9 hole peg test to under 60 sec. Rt hand and under 2 min. Lt hand    Baseline Rt = 74.09 sec, Lt = 2 min. 36 sec    Time 4    Period Weeks    Status New               OT Long Term Goals - 07/12/20 1358       OT LONG TERM GOAL #1   Title Independent with udpated HEP    Baseline dependent    Time 10    Period Weeks    Status New      OT LONG TERM GOAL #2   Title Pt to perform dynamic standing tasks for 15 minutes w/o rest or LOB    Baseline requires assist - unable to tolerate 15 min    Time 10    Period Weeks    Status New      OT LONG TERM GOAL #3   Title Pt to perform simple IADLS in standing (washing dishes, folding towels, make sandwich)    Baseline dependent    Time 10    Period Weeks    Status New      OT LONG TERM GOAL #4   Title Improve grip strength Lt hand to 60 lbs or greater    Baseline 53 lbs (Rt = 83.9  lbs)    Time 10    Period Weeks    Status New      OT LONG TERM GOAL #5   Title Pt to perform overhead reaching LUE to retrieve light weight object from high shelf    Baseline midrange only    Time 10    Period Weeks    Status New                   Plan - 09/13/20 1600     Clinical Impression Statement Pt returns today having not been seen by O.T. since 08/02/20. Pt continues to demo deficits in ROM, strength, coordination, balance, and requires extra time for all tasks.    OT Occupational Profile and History Detailed Assessment- Review of Records and additional review of physical, cognitive, psychosocial history related to current functional performance    Occupational performance deficits (Please refer to evaluation for details): ADL's;IADL's;Social Participation;Leisure    Body Structure / Function / Physical Skills ADL;Strength;Balance;UE functional use;Body mechanics;Endurance;IADL;ROM;Coordination;Mobility;FMC;Decreased knowledge of precautions    Cognitive Skills Memory;Problem Solve;Safety Awareness;Thought    Rehab Potential Good    Clinical Decision Making Several treatment options, min-mod task modification necessary    Comorbidities Affecting Occupational Performance: May have comorbidities impacting occupational performance    Modification or Assistance to Complete Evaluation  Min-Moderate modification of tasks or assist with assess necessary to complete eval    OT Frequency 2x / week    OT Duration 6 weeks      OT Treatment/Interventions Self-care/ADL training;Moist Heat;DME and/or AE instruction;Therapeutic activities;Therapeutic exercise;Cognitive remediation/compensation;Coping strategies training;Functional Mobility Training;Neuromuscular education;Passive range of motion;Manual Therapy;Patient/family education    Plan check on MCD authorization for today's visit and following visit and adjust accordingly, continue shoulder ROM, coordination, endurance     Consulted and Agree with Plan of Care Patient;Family member/caregiver  Patient will benefit from skilled therapeutic intervention in order to improve the following deficits and impairments:   Body Structure / Function / Physical Skills: ADL, Strength, Balance, UE functional use, Body mechanics, Endurance, IADL, ROM, Coordination, Mobility, FMC, Decreased knowledge of precautions Cognitive Skills: Memory, Problem Solve, Safety Awareness, Thought     Visit Diagnosis: Other lack of coordination  Muscle weakness (generalized)  Unsteadiness on feet  Stiffness of left shoulder, not elsewhere classified  Stiffness of right shoulder, not elsewhere classified    Problem List Patient Active Problem List   Diagnosis Date Noted   Postural dizziness with presyncope 06/08/2020   Hypercalcemia 06/08/2020   Hypertension    Chronic diastolic CHF (congestive heart failure) (HCC)    Abnormality of gait 05/19/2020   Visual disturbance    Acute blood loss anemia    Schizophrenia (HCC)    Elevated triglycerides with high cholesterol    Controlled type 2 diabetes mellitus with hyperglycemia (HCC)    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   Pressure injury of skin 02/16/2020   Toxic metabolic encephalopathy    AKI (acute kidney injury) (HCC)    Dehydration    Pancreatitis    Respiratory failure (HCC)    DKA (diabetic ketoacidosis) (HCC) 01/25/2020   MDD (major depressive disorder), recurrent, severe, 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 [place], [place], [address] Phone: [phone]   Fax:  [phone]  Name: Bryan [name] MRN: [id number] Date of Birth: [birth year]

## 2020-09-15 ENCOUNTER — Ambulatory Visit: Payer: Medicaid Other | Admitting: Occupational Therapy

## 2020-09-15 ENCOUNTER — Ambulatory Visit: Payer: Medicaid Other | Admitting: Physical Therapy

## 2020-09-15 ENCOUNTER — Other Ambulatory Visit: Payer: Self-pay

## 2020-09-15 DIAGNOSIS — R278 Other lack of coordination: Secondary | ICD-10-CM

## 2020-09-15 DIAGNOSIS — R2681 Unsteadiness on feet: Secondary | ICD-10-CM

## 2020-09-15 DIAGNOSIS — M6281 Muscle weakness (generalized): Secondary | ICD-10-CM

## 2020-09-15 DIAGNOSIS — M25611 Stiffness of right shoulder, not elsewhere classified: Secondary | ICD-10-CM

## 2020-09-15 DIAGNOSIS — M25612 Stiffness of left shoulder, not elsewhere classified: Secondary | ICD-10-CM

## 2020-09-15 DIAGNOSIS — R41840 Attention and concentration deficit: Secondary | ICD-10-CM

## 2020-09-15 NOTE — Therapy (Signed)
[facility] [address] [place], [place], [address] Phone: [phone]   Fax:  [phone]  Occupational Therapy Treatment  Patient Details  Name: Bryan [name] MRN: [id number] Date of Birth: 03-May-1975 Referring Provider (OT): Dr. Jeanie Cooks   Encounter Date: 09/15/2020   OT End of Session - 09/15/20 1332     Visit Number 5    Number of Visits 15    Date for OT Re-Evaluation 10/21/20    Authorization Type MCD    Authorization Time Period CCME approval: 12 OT visits from 09/14/20 - 10/25/20   Authorization - Visit Number 1   Authorization - Number of Visits 12   Progress Note Due on Visit 10   OT Start Time 1316   First 7 minutes of session pt was using the restroom; OT treatment started at 1323   OT Stop Time 1400    OT Time Calculation (min) 43 min    Activity Tolerance Patient tolerated treatment well    Behavior During Therapy [facility] for tasks assessed/performed            Past Medical History:  Diagnosis Date   Controlled type 2 diabetes mellitus with hyperglycemia ([place])    Hypertension    Psychiatric problem    Seen at [facility] for unknown reason. Takes seroquel. History of hearing voices.  Not commandivng voices.    Schizophrenia ([place])    Tremors of nervous system     Past Surgical History:  Procedure Laterality Date   none     TRACHEOSTOMY TUBE PLACEMENT N/A 02/13/2020   Procedure: TRACHEOSTOMY;  Surgeon: Leta Baptist, MD;  Location: [facility] OR;  Service: ENT;  Laterality: N/A;    There were no vitals filed for this visit.   Subjective Assessment - 09/15/20 1327     Subjective  "I'm just tired"    Patient is accompanied by: Family member   mother   Pertinent History Went to ED 01/25/20 with DKA and AKI w/ newly diagnosed DM. Pt developed metabolic encephalopathy and anoxic brain damage as a result. PMH: schizoaffective disorder, HTN    Limitations fall risk,    Currently in Pain? No/denies              Treatment/Exercises - 09/15/20    UE ROM  Closed-chain BUE forward chest press w/ unweighted dowel while sitting, requiring verbal cues to decrease compensatory patterns and to avoid pushing past point of discomfort; completed 2 sets  Due to difficulty w/ closed-chain shoulder flexion against gravity, OT incorporated UE Ranger, positioning base at an angle to facilitate Lt shoulder flexion for functional overhead reach; pt able to complete exercise w/out add'l pain in Lt shoulder and good control    Blocks Picking up 1" blocks using pinch prehension and placing onto stacks of 6 to facilitate slow/controlled movements and improved functional use/FMC of LUE. Pt attempted 1 stack of 10 blocks for increased control w/ Min A for stabilization    Handwriting Practiced handwriting first name w/ Rt hand; OT trialed standard pen, pen w/ built-up handle, and weighted pen with increased success (spacing, size, control, speed) using built-up handle and weighted pen; education provided and tan built-up handle administered            OT Short Term Goals - 09/15/20 1410       OT SHORT TERM GOAL #1   Title Independent with bilateral shoulder and coordination HEP    Baseline dependent    Time 4  Period Weeks    Status On-going      OT SHORT TERM GOAL #2   Title Pt to write full name at 75% or greater legibility and cut food w/ A/E PRN    Baseline 50% first name only, dependent for cutting food    Time 4    Period Weeks    Status On-going      OT SHORT TERM GOAL #3   Title Pt to perform all BADLS at mod I level    Baseline requires set up/supervision    Time 4    Period Weeks    Status On-going      OT SHORT TERM GOAL #4   Title Pt/family to verbalize understanding with memory strategies    Baseline dependent    Time 4    Period Weeks    Status New      OT SHORT TERM GOAL #5   Title Pt to improve coordination bilaterally as evidenced by reducing speed on 9 hole peg test to under  60 sec. Rt hand and under 2 min. Lt hand    Baseline Rt = 74.09 sec, Lt = 2 min. 36 sec    Time 4    Period Weeks    Status Partially Met   09/15/20: 9-HPT in 1 min, 39 sec w/ Lt hand            OT Long Term Goals - 07/12/20 1358       OT LONG TERM GOAL #1   Title Independent with udpated HEP    Baseline dependent    Time 10    Period Weeks    Status New      OT LONG TERM GOAL #2   Title Pt to perform dynamic standing tasks for 15 minutes w/o rest or LOB    Baseline requires assist - unable to tolerate 15 min    Time 10    Period Weeks    Status New      OT LONG TERM GOAL #3   Title Pt to perform simple IADLS in standing (washing dishes, folding towels, make sandwich)    Baseline dependent    Time 10    Period Weeks    Status New      OT LONG TERM GOAL #4   Title Improve grip strength Lt hand to 60 lbs or greater    Baseline 53 lbs (Rt = 83.9 lbs)    Time 10    Period Weeks    Status New      OT LONG TERM GOAL #5   Title Pt to perform overhead reaching LUE to retrieve light weight object from high shelf    Baseline midrange only    Time 10    Period Weeks    Status New             Plan - 09/15/20 1333     Clinical Impression Statement Pt is progressing toward goals and demo'd good coordination/control this session. Pt continues to require verbal cueing for safety during activities and extended time for processing.   OT Occupational Profile and History Detailed Assessment- Review of Records and additional review of physical, cognitive, psychosocial history related to current functional performance    Occupational performance deficits (Please refer to evaluation for details): ADL's;IADL's;Social Participation;Leisure    Body Structure / Function / Physical Skills ADL;Strength;Balance;UE functional use;Body mechanics;Endurance;IADL;ROM;Coordination;Mobility;FMC;Decreased knowledge of precautions    Cognitive Skills Memory;Problem Solve;Safety Awareness;Thought     Rehab Potential  Good    Clinical Decision Making Several treatment options, min-mod task modification necessary    Comorbidities Affecting Occupational Performance: May have comorbidities impacting occupational performance    Modification or Assistance to Complete Evaluation  Min-Moderate modification of tasks or assist with assess necessary to complete eval    OT Frequency 2x / week    OT Duration 6 weeks    OT Treatment/Interventions Self-care/ADL training;Moist Heat;DME and/or AE instruction;Therapeutic activities;Therapeutic exercise;Cognitive remediation/compensation;Coping strategies training;Functional Mobility Training;Neuromuscular education;Passive range of motion;Manual Therapy;Patient/family education    Plan Continue endurance, shoulder ROM, coordination (9-HPT w/ Rt hand?); introduce/practice memory compensatory strategies? (STG4)   Consulted and Agree with Plan of Care Patient;Family member/caregiver             Patient will benefit from skilled therapeutic intervention in order to improve the following deficits and impairments:   Body Structure / Function / Physical Skills: ADL, Strength, Balance, UE functional use, Body mechanics, Endurance, IADL, ROM, Coordination, Mobility, FMC, Decreased knowledge of precautions Cognitive Skills: Memory, Problem Solve, Safety Awareness, Thought   Visit Diagnosis: Other lack of coordination  Muscle weakness (generalized)  Stiffness of left shoulder, not elsewhere classified  Stiffness of right shoulder, not elsewhere classified  Attention and concentration deficit  Unsteadiness on feet    Problem List Patient Active Problem List   Diagnosis Date Noted   Postural dizziness with presyncope 06/08/2020   Hypercalcemia 06/08/2020   Hypertension    Chronic diastolic CHF (congestive heart failure) (HCC)    Abnormality of gait 05/19/2020   Visual disturbance    Acute blood loss anemia    Schizophrenia (HCC)    Elevated  triglycerides with high cholesterol    Controlled type 2 diabetes mellitus with hyperglycemia (HCC)    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[place]) 03/15/2020   Pressure injury of skin 14/70/9295   Toxic metabolic encephalopathy    AKI (acute kidney injury) ([place])    Dehydration    Pancreatitis    Respiratory failure ([place])    DKA (diabetic ketoacidosis) ([place]) 01/25/2020   MDD (major depressive disorder), recurrent, severe, with psychosis ([place]) 03/12/2018   Paranoid schizophrenia ([place])    Undifferentiated schizophrenia ([place])    Delusional disorder ([place])    Illiterate 10/30/2011   Schizoaffective disorder, depressive type ([place]) 10/27/2011   Schizoaffective disorder ([place]) 08/10/2011   Observed seizure-like activity ([place]) 08/09/2011   PAIN IN JOINT, MULTIPLE SITES 02/16/2010   FATIGUE 02/16/2010   Shortness of breath 02/16/2010     Kathrine Cords, OTR/L, MSOT  09/15/2020, 2:00 PM  [facility] [address] [place], [place], [address] Phone: [phone]   Fax:  [phone]  Name: Ebbie Cherry MRN: [id number] Date of Birth: 1975/09/27

## 2020-09-20 ENCOUNTER — Ambulatory Visit: Payer: Medicaid Other | Admitting: Occupational Therapy

## 2020-09-20 ENCOUNTER — Other Ambulatory Visit: Payer: Self-pay

## 2020-09-20 DIAGNOSIS — M25612 Stiffness of left shoulder, not elsewhere classified: Secondary | ICD-10-CM

## 2020-09-20 DIAGNOSIS — R278 Other lack of coordination: Secondary | ICD-10-CM

## 2020-09-20 DIAGNOSIS — R41840 Attention and concentration deficit: Secondary | ICD-10-CM

## 2020-09-20 DIAGNOSIS — M25611 Stiffness of right shoulder, not elsewhere classified: Secondary | ICD-10-CM

## 2020-09-20 NOTE — Patient Instructions (Signed)

## 2020-09-21 NOTE — Therapy (Signed)
[facility] [address] [place], [place], [address] Phone: [phone]   Fax:  [phone]  Occupational Therapy Treatment  Patient Details  Name: Bryan [name] MRN: [id number] Date of Birth: 11-18-1975 Referring Provider (OT): Dr. Jeanie Cooks   Encounter Date: 09/20/2020   OT End of Session - 09/21/20 1042     Visit Number 6    Number of Visits 15    Date for OT Re-Evaluation 10/21/20    Authorization Type MCD    Authorization Time Period CCME approval: 12 OT visits from 09/14/20 - 10/25/20    Authorization - Visit Number 2    Authorization - Number of Visits 12    Progress Note Due on Visit 10    OT Start Time 1400    OT Stop Time 1445    OT Time Calculation (min) 45 min    Activity Tolerance Patient tolerated treatment well    Behavior During Therapy [facility] for tasks assessed/performed             Past Medical History:  Diagnosis Date   Controlled type 2 diabetes mellitus with hyperglycemia ([place])    Hypertension    Psychiatric problem    Seen at [facility] for unknown reason. Takes seroquel. History of hearing voices.  Not commandivng voices.    Schizophrenia ([place])    Tremors of nervous system     Past Surgical History:  Procedure Laterality Date   none     TRACHEOSTOMY TUBE PLACEMENT N/A 02/13/2020   Procedure: TRACHEOSTOMY;  Surgeon: Leta Baptist, MD;  Location: [facility] OR;  Service: ENT;  Laterality: N/A;    There were no vitals filed for this visit.   Subjective Assessment - 09/20/20 1418     Subjective  I've been doing my ex's    Patient is accompanied by: Family member   mother   Pertinent History Went to ED 01/25/20 with DKA and AKI w/ newly diagnosed DM. Pt developed metabolic encephalopathy and anoxic brain damage as a result. PMH: schizoaffective disorder, HTN    Limitations fall risk,    Currently in Pain? No/denies             UBE x 5 min, level 1 for UE ROM and endurance.   Practiced  cutting food with built up handle and issued red foam. Discussed standing tasks and ways to incorporate more at home including standing at counter or sturdy table to fold clothes w/ chair behind him in case he needs to sit, and standing to wash dishes w/ countertop support and close supervision.  Discussed memory strategies and ways to implement at home - unable to print handout today secondary to power outage.  Discussed use of walker basket to help transport light items from room to room - will issue handout next session.                        OT Short Term Goals - 09/21/20 1043       OT SHORT TERM GOAL #1   Title Independent with bilateral shoulder and coordination HEP    Baseline dependent    Time 4    Period Weeks    Status Achieved      OT SHORT TERM GOAL #2   Title Pt to write full name at 75% or greater legibility and cut food w/ A/E PRN    Baseline 50% first name only, dependent for cutting food    Time 4  Period Weeks    Status On-going      OT SHORT TERM GOAL #3   Title Pt to perform all BADLS at mod I level    Baseline requires set up/supervision    Time 4    Period Weeks    Status On-going      OT SHORT TERM GOAL #4   Title Pt/family to verbalize understanding with memory strategies    Baseline dependent    Time 4    Period Weeks    Status On-going      OT SHORT TERM GOAL #5   Title Pt to improve coordination bilaterally as evidenced by reducing speed on 9 hole peg test to under 60 sec. Rt hand and under 2 min. Lt hand    Baseline Rt = 74.09 sec, Lt = 2 min. 36 sec    Time 4    Period Weeks    Status Partially Met   09/15/20: 9-HPT in 1 min, 39 sec w/ Lt hand              OT Long Term Goals - 07/12/20 1358       OT LONG TERM GOAL #1   Title Independent with udpated HEP    Baseline dependent    Time 10    Period Weeks    Status New      OT LONG TERM GOAL #2   Title Pt to perform dynamic standing tasks for 15 minutes w/o  rest or LOB    Baseline requires assist - unable to tolerate 15 min    Time 10    Period Weeks    Status New      OT LONG TERM GOAL #3   Title Pt to perform simple IADLS in standing (washing dishes, folding towels, make sandwich)    Baseline dependent    Time 10    Period Weeks    Status New      OT LONG TERM GOAL #4   Title Improve grip strength Lt hand to 60 lbs or greater    Baseline 53 lbs (Rt = 83.9 lbs)    Time 10    Period Weeks    Status New      OT LONG TERM GOAL #5   Title Pt to perform overhead reaching LUE to retrieve light weight object from high shelf    Baseline midrange only    Time 10    Period Weeks    Status New                   Plan - 09/21/20 1043     Clinical Impression Statement Pt continues to make gradual progress towards goals and reports working on coordination more at home    OT Occupational Profile and History Detailed Assessment- Review of Records and additional review of physical, cognitive, psychosocial history related to current functional performance    Occupational performance deficits (Please refer to evaluation for details): ADL's;IADL's;Social Participation;Leisure    Body Structure / Function / Physical Skills ADL;Strength;Balance;UE functional use;Body mechanics;Endurance;IADL;ROM;Coordination;Mobility;FMC;Decreased knowledge of precautions    Cognitive Skills Memory;Problem Solve;Safety Awareness;Thought    Rehab Potential Good    Clinical Decision Making Several treatment options, min-mod task modification necessary    Comorbidities Affecting Occupational Performance: May have comorbidities impacting occupational performance    Modification or Assistance to Complete Evaluation  Min-Moderate modification of tasks or assist with assess necessary to complete eval    OT Frequency 2x / week  OT Duration 6 weeks    OT Treatment/Interventions Self-care/ADL training;Moist Heat;DME and/or AE instruction;Therapeutic  activities;Therapeutic exercise;Cognitive remediation/compensation;Coping strategies training;Functional Mobility Training;Neuromuscular education;Passive range of motion;Manual Therapy;Patient/family education    Plan Continue endurance, shoulder ROM, coordination (9-HPT w/ Rt hand?); issue handout from last session on memory strategies, issue walker basket handout    Consulted and Agree with Plan of Care Patient;Family member/caregiver             Patient will benefit from skilled therapeutic intervention in order to improve the following deficits and impairments:   Body Structure / Function / Physical Skills: ADL, Strength, Balance, UE functional use, Body mechanics, Endurance, IADL, ROM, Coordination, Mobility, FMC, Decreased knowledge of precautions Cognitive Skills: Memory, Problem Solve, Safety Awareness, Thought     Visit Diagnosis: Other lack of coordination  Stiffness of left shoulder, not elsewhere classified  Stiffness of right shoulder, not elsewhere classified  Attention and concentration deficit    Problem List Patient Active Problem List   Diagnosis Date Noted   Postural dizziness with presyncope 06/08/2020   Hypercalcemia 06/08/2020   Hypertension    Chronic diastolic CHF (congestive heart failure) (HCC)    Abnormality of gait 05/19/2020   Visual disturbance    Acute blood loss anemia    Schizophrenia (HCC)    Elevated triglycerides with high cholesterol    Controlled type 2 diabetes mellitus with hyperglycemia (HCC)    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[place]) 03/15/2020   Pressure injury of skin 06/08/6436   Toxic metabolic encephalopathy    AKI (acute kidney injury) ([place])    Dehydration    Pancreatitis    Respiratory failure ([place])    DKA (diabetic ketoacidosis) ([place]) 01/25/2020   MDD (major depressive disorder), recurrent, severe, with psychosis ([place]) 03/12/2018   Paranoid schizophrenia ([place])    Undifferentiated schizophrenia ([place])    Delusional disorder ([place])     Illiterate 10/30/2011   Schizoaffective disorder, depressive type ([place]) 10/27/2011   Schizoaffective disorder ([place]) 08/10/2011   Observed seizure-like activity ([place]) 08/09/2011   PAIN IN JOINT, MULTIPLE SITES 02/16/2010   FATIGUE 02/16/2010   Shortness of breath 02/16/2010    Carey Bullocks, OTR/L 09/21/2020, 10:46 AM  [facility] [address] [place], [place], [address] Phone: [phone]   Fax:  [phone]  Name: Undray Allman MRN: [id number] Date of Birth: Nov 11, 1975

## 2020-09-22 ENCOUNTER — Ambulatory Visit: Payer: Medicaid Other | Admitting: Occupational Therapy

## 2020-09-22 ENCOUNTER — Encounter: Payer: Self-pay | Admitting: Occupational Therapy

## 2020-09-22 ENCOUNTER — Other Ambulatory Visit: Payer: Self-pay

## 2020-09-22 DIAGNOSIS — M25612 Stiffness of left shoulder, not elsewhere classified: Secondary | ICD-10-CM

## 2020-09-22 DIAGNOSIS — R41840 Attention and concentration deficit: Secondary | ICD-10-CM

## 2020-09-22 DIAGNOSIS — R2681 Unsteadiness on feet: Secondary | ICD-10-CM

## 2020-09-22 DIAGNOSIS — M6281 Muscle weakness (generalized): Secondary | ICD-10-CM

## 2020-09-22 DIAGNOSIS — M25611 Stiffness of right shoulder, not elsewhere classified: Secondary | ICD-10-CM

## 2020-09-22 DIAGNOSIS — R278 Other lack of coordination: Secondary | ICD-10-CM

## 2020-09-22 NOTE — Therapy (Signed)
[facility] [address] [place], [place], [address] Phone: [phone]   Fax:  [phone]  Occupational Therapy Treatment  Patient Details  Name: Bryan [name] MRN: [id number] Date of Birth: [birth year] Referring Provider (OT): Dr. Jeanie Cooks   Encounter Date: 09/22/2020   OT End of Session - 09/22/20 1328     Visit Number 7    Number of Visits 15    Date for OT Re-Evaluation 10/21/20    Authorization Type MCD    Authorization Time Period CCME approval: 12 OT visits from 09/14/20 - 10/25/20    Authorization - Visit Number 3    Authorization - Number of Visits 12    Progress Note Due on Visit 10    OT Start Time 1322    OT Stop Time 1400    OT Time Calculation (min) 38 min    Activity Tolerance Patient tolerated treatment well    Behavior During Therapy [facility] for tasks assessed/performed             Past Medical History:  Diagnosis Date   Controlled type 2 diabetes mellitus with hyperglycemia ([place])    Hypertension    Psychiatric problem    Seen at [facility] for unknown reason. Takes seroquel. History of hearing voices.  Not commandivng voices.    Schizophrenia ([place])    Tremors of nervous system     Past Surgical History:  Procedure Laterality Date   none     TRACHEOSTOMY TUBE PLACEMENT N/A 02/13/2020   Procedure: TRACHEOSTOMY;  Surgeon: Leta Baptist, MD;  Location: [facility] OR;  Service: ENT;  Laterality: N/A;    There were no vitals filed for this visit.   Subjective Assessment - 09/22/20 1327     Subjective  "I feel like my body needs to wake up; my legs feel a little stiff"    Patient is accompanied by: Family member   mother   Pertinent History Went to ED 01/25/20 with DKA and AKI w/ newly diagnosed DM. Pt developed metabolic encephalopathy and anoxic brain damage as a result. PMH: schizoaffective disorder, HTN    Limitations fall risk    Currently in Pain? No/denies             Treatment/Exercises -  09/22/20 1344     Towel Slides: Seated BUE towel slides on tabletop for shoulder flex/elbow ext with min facilitation/cues; completed 2 sets w/ no add'l shoulder discomfort    Resistance Clothespins Standing w/ RW at elevated tabletop while removing/placing resistance clothespins (1-8#) on vertical bar w/ RUE to facilitate functional overhead reach, increased [place], activity tolerance, and hand strength; pt able to tolerate 6 min of standing, completed remaining clothespins while sitting using L hand, and alternating back to R hand to remove clothespins from vertical bar    Coordination 9HPT w/ R hand x2 to continue to facilitate dexterity, precision, and in-hand manipulation; 80 sec 2nd trial             OT Education - 09/22/20 1344     Education Details Reviewed memory compensatory strategies and walker basket for potential self-purchase; handouts provided    Person(s) Educated Patient;Parent(s)    Methods Explanation;Handout    Comprehension Verbalized understanding;Need further instruction             OT Short Term Goals - 09/21/20 1043       OT SHORT TERM GOAL #1   Title Independent with bilateral shoulder and coordination HEP    Baseline dependent  Time 4    Period Weeks    Status Achieved      OT SHORT TERM GOAL #2   Title Pt to write full name at 75% or greater legibility and cut food w/ A/E PRN    Baseline 50% first name only, dependent for cutting food    Time 4    Period Weeks    Status On-going      OT SHORT TERM GOAL #3   Title Pt to perform all BADLS at mod I level    Baseline requires set up/supervision    Time 4    Period Weeks    Status On-going      OT SHORT TERM GOAL #4   Title Pt/family to verbalize understanding with memory strategies    Baseline dependent    Time 4    Period Weeks    Status On-going      OT SHORT TERM GOAL #5   Title Pt to improve coordination bilaterally as evidenced by reducing speed on 9 hole peg test to under 60 sec.  Rt hand and under 2 min. Lt hand    Baseline Rt = 74.09 sec, Lt = 2 min. 36 sec    Time 4    Period Weeks    Status Partially Met   09/15/20: 9-HPT in 1 min, 39 sec w/ Lt hand            OT Long Term Goals - 09/22/20 1348       OT LONG TERM GOAL #1   Title Independent with udpated HEP    Baseline dependent    Time 10    Period Weeks    Status On-going      OT LONG TERM GOAL #2   Title Pt to perform dynamic standing tasks for 15 minutes w/o rest or LOB    Baseline requires assist - unable to tolerate 15 min    Time 10    Period Weeks    Status On-going      OT LONG TERM GOAL #3   Title Pt to perform simple IADLS in standing (washing dishes, folding towels, make sandwich)    Baseline dependent    Time 10    Period Weeks    Status On-going      OT LONG TERM GOAL #4   Title Improve grip strength Lt hand to 60 lbs or greater    Baseline 53 lbs (Rt = 83.9 lbs)    Time 10    Period Weeks    Status On-going      OT LONG TERM GOAL #5   Title Pt to perform overhead reaching LUE to retrieve light weight object from high shelf    Baseline midrange only    Time 10    Period Weeks    Status On-going             Plan - 09/22/20 1349     Clinical Impression Statement Pt continues to make gradual progress towards goals and was able to tolerate 6 minutes of standing during resistance clothespins activity. Pt requires extended time for processing and demo's decreased speed of movement.    OT Occupational Profile and History Detailed Assessment- Review of Records and additional review of physical, cognitive, psychosocial history related to current functional performance    Occupational performance deficits (Please refer to evaluation for details): ADL's;IADL's;Social Participation;Leisure    Body Structure / Function / Physical Skills ADL;Strength;Balance;UE functional use;Body mechanics;Endurance;IADL;ROM;Coordination;Mobility;FMC;Decreased knowledge of precautions  Cognitive Skills Memory;Problem Solve;Safety Awareness;Thought    Rehab Potential Good    Clinical Decision Making Several treatment options, min-mod task modification necessary    Comorbidities Affecting Occupational Performance: May have comorbidities impacting occupational performance    Modification or Assistance to Complete Evaluation  Min-Moderate modification of tasks or assist with assess necessary to complete eval    OT Frequency 2x / week    OT Duration 6 weeks    OT Treatment/Interventions Self-care/ADL training;Moist Heat;DME and/or AE instruction;Therapeutic activities;Therapeutic exercise;Cognitive remediation/compensation;Coping strategies training;Functional Mobility Training;Neuromuscular education;Passive range of motion;Manual Therapy;Patient/family education    Plan Continue to address standing tolerance, shoulder ROM/overhead reach, bilateral hand coordination; review memory compensatory strategies (STG4; handout administered this session)    Consulted and Agree with Plan of Care Patient;Family member/caregiver             Patient will benefit from skilled therapeutic intervention in order to improve the following deficits and impairments:   Body Structure / Function / Physical Skills: ADL, Strength, Balance, UE functional use, Body mechanics, Endurance, IADL, ROM, Coordination, Mobility, FMC, Decreased knowledge of precautions Cognitive Skills: Memory, Problem Solve, Safety Awareness, Thought   Visit Diagnosis: Stiffness of left shoulder, not elsewhere classified  Stiffness of right shoulder, not elsewhere classified  Muscle weakness (generalized)  Unsteadiness on feet  Other lack of coordination  Attention and concentration deficit    Problem List Patient Active Problem List   Diagnosis Date Noted   Postural dizziness with presyncope 06/08/2020   Hypercalcemia 06/08/2020   Hypertension    Chronic diastolic CHF (congestive heart failure) (HCC)     Abnormality of gait 05/19/2020   Visual disturbance    Acute blood loss anemia    Schizophrenia (HCC)    Elevated triglycerides with high cholesterol    Controlled type 2 diabetes mellitus with hyperglycemia (HCC)    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[place]) 03/15/2020   Pressure injury of skin 16/02/930   Toxic metabolic encephalopathy    AKI (acute kidney injury) ([place])    Dehydration    Pancreatitis    Respiratory failure ([place])    DKA (diabetic ketoacidosis) ([place]) 01/25/2020   MDD (major depressive disorder), recurrent, severe, with psychosis ([place]) 03/12/2018   Paranoid schizophrenia ([place])    Undifferentiated schizophrenia ([place])    Delusional disorder ([place])    Illiterate 10/30/2011   Schizoaffective disorder, depressive type ([place]) 10/27/2011   Schizoaffective disorder ([place]) 08/10/2011   Observed seizure-like activity ([place]) 08/09/2011   PAIN IN JOINT, MULTIPLE SITES 02/16/2010   FATIGUE 02/16/2010   Shortness of breath 02/16/2010     Kathrine Cords, OTR/L, MSOT 09/22/2020, 1:56 PM  [facility] [address] [place], [place], [address] Phone: [phone]   Fax:  [phone]  Name: Bryan [name] MRN: [id number] Date of Birth: [birth year]

## 2020-09-27 ENCOUNTER — Ambulatory Visit: Payer: Medicaid Other | Attending: Internal Medicine | Admitting: Occupational Therapy

## 2020-09-27 ENCOUNTER — Other Ambulatory Visit: Payer: Self-pay

## 2020-09-27 ENCOUNTER — Ambulatory Visit: Payer: Medicaid Other | Admitting: Physical Therapy

## 2020-09-27 ENCOUNTER — Encounter: Payer: Self-pay | Admitting: Physical Therapy

## 2020-09-27 ENCOUNTER — Encounter: Payer: Self-pay | Admitting: Occupational Therapy

## 2020-09-27 DIAGNOSIS — R2681 Unsteadiness on feet: Secondary | ICD-10-CM | POA: Diagnosis present

## 2020-09-27 DIAGNOSIS — R278 Other lack of coordination: Secondary | ICD-10-CM | POA: Diagnosis present

## 2020-09-27 DIAGNOSIS — R41840 Attention and concentration deficit: Secondary | ICD-10-CM | POA: Diagnosis present

## 2020-09-27 DIAGNOSIS — M6281 Muscle weakness (generalized): Secondary | ICD-10-CM | POA: Diagnosis present

## 2020-09-27 DIAGNOSIS — M25612 Stiffness of left shoulder, not elsewhere classified: Secondary | ICD-10-CM | POA: Diagnosis present

## 2020-09-27 DIAGNOSIS — M25611 Stiffness of right shoulder, not elsewhere classified: Secondary | ICD-10-CM | POA: Diagnosis present

## 2020-09-27 DIAGNOSIS — R2689 Other abnormalities of gait and mobility: Secondary | ICD-10-CM | POA: Diagnosis present

## 2020-09-27 NOTE — Therapy (Signed)
[facility] [address] [place], [place], [address] Phone: [phone]   Fax:  [phone]  Occupational Therapy Treatment  Patient Details  Name: Bryan [name] MRN: [id number] Date of Birth: 1975-07-06 Referring Provider (OT): Dr. Jeanie Cooks   Encounter Date: 09/27/2020   OT End of Session - 09/27/20 1253     Visit Number 8    Number of Visits 15    Date for OT Re-Evaluation 10/21/20    Authorization Type MCD    Authorization Time Period CCME approval: 12 OT visits from 09/14/20 - 10/25/20    Authorization - Visit Number 4    Authorization - Number of Visits 12    Progress Note Due on Visit 10    OT Start Time 1240    OT Stop Time 1315    OT Time Calculation (min) 35 min    Activity Tolerance Patient tolerated treatment well    Behavior During Therapy [facility] for tasks assessed/performed             Past Medical History:  Diagnosis Date   Controlled type 2 diabetes mellitus with hyperglycemia ([place])    Hypertension    Psychiatric problem    Seen at [facility] for unknown reason. Takes seroquel. History of hearing voices.  Not commandivng voices.    Schizophrenia ([place])    Tremors of nervous system     Past Surgical History:  Procedure Laterality Date   none     TRACHEOSTOMY TUBE PLACEMENT N/A 02/13/2020   Procedure: TRACHEOSTOMY;  Surgeon: Leta Baptist, MD;  Location: [facility] OR;  Service: ENT;  Laterality: N/A;    There were no vitals filed for this visit.   Subjective Assessment - 09/27/20 1252     Subjective  "My arms be feeling stiff and tired from doing the stretches - I was working out at home"    Patient is accompanied by: Family member   mother   Pertinent History Went to ED 01/25/20 with DKA and AKI w/ newly diagnosed DM. Pt developed metabolic encephalopathy and anoxic brain damage as a result. PMH: schizoaffective disorder, HTN    Limitations fall risk    Currently in Pain? No/denies                           OT Treatments/Exercises (OP) - 09/27/20 1259       ADLs   Grooming pt reqiured min cueing and SBA for hand hygiene for sequencing and for balance without UE support.    Toileting assisted patient with getting in and out of restroom with min A for door management and CGA for turns. pt req'd cue for hand hygiene.      Exercises   Exercises Hand      Fine Motor Coordination (Hand/Wrist)   Fine Motor Coordination Large Pegboard    Large Pegboard medium pegs with LUE - pt had max difficulty with following and copying pattern. Pt then filled in pegboard with any color pegs. Pt still moving slowly but was able to continue task with external stimuli and distractions. Pt then had to remove pegs with following 3 - 5 step sequence with good ability to follow multi steps. Pt had more difficulty with 5 step sequence and req'd moderate assistance.                      OT Short Term Goals - 09/21/20 1043       OT  SHORT TERM GOAL #1   Title Independent with bilateral shoulder and coordination HEP    Baseline dependent    Time 4    Period Weeks    Status Achieved      OT SHORT TERM GOAL #2   Title Pt to write full name at 75% or greater legibility and cut food w/ A/E PRN    Baseline 50% first name only, dependent for cutting food    Time 4    Period Weeks    Status On-going      OT SHORT TERM GOAL #3   Title Pt to perform all BADLS at mod I level    Baseline requires set up/supervision    Time 4    Period Weeks    Status On-going      OT SHORT TERM GOAL #4   Title Pt/family to verbalize understanding with memory strategies    Baseline dependent    Time 4    Period Weeks    Status On-going      OT SHORT TERM GOAL #5   Title Pt to improve coordination bilaterally as evidenced by reducing speed on 9 hole peg test to under 60 sec. Rt hand and under 2 min. Lt hand    Baseline Rt = 74.09 sec, Lt = 2 min. 36 sec    Time 4    Period Weeks     Status Partially Met   09/15/20: 9-HPT in 1 min, 39 sec w/ Lt hand              OT Long Term Goals - 09/22/20 1348       OT LONG TERM GOAL #1   Title Independent with udpated HEP    Baseline dependent    Time 10    Period Weeks    Status On-going      OT LONG TERM GOAL #2   Title Pt to perform dynamic standing tasks for 15 minutes w/o rest or LOB    Baseline requires assist - unable to tolerate 15 min    Time 10    Period Weeks    Status On-going      OT LONG TERM GOAL #3   Title Pt to perform simple IADLS in standing (washing dishes, folding towels, make sandwich)    Baseline dependent    Time 10    Period Weeks    Status On-going      OT LONG TERM GOAL #4   Title Improve grip strength Lt hand to 60 lbs or greater    Baseline 53 lbs (Rt = 83.9 lbs)    Time 10    Period Weeks    Status On-going      OT LONG TERM GOAL #5   Title Pt to perform overhead reaching LUE to retrieve light weight object from high shelf    Baseline midrange only    Time 10    Period Weeks    Status On-going                   Plan - 09/27/20 1625     Clinical Impression Statement Pt able to follow 3-4 sequence but unable to copy and replicate pegboard pattern this day. Continues to progress towards goals.    OT Occupational Profile and History Detailed Assessment- Review of Records and additional review of physical, cognitive, psychosocial history related to current functional performance    Occupational performance deficits (Please refer to evaluation for details): ADL's;IADL's;Social Participation;Leisure  Body Structure / Function / Physical Skills ADL;Strength;Balance;UE functional use;Body mechanics;Endurance;IADL;ROM;Coordination;Mobility;FMC;Decreased knowledge of precautions    Cognitive Skills Memory;Problem Solve;Safety Awareness;Thought    Rehab Potential Good    Clinical Decision Making Several treatment options, min-mod task modification necessary    Comorbidities  Affecting Occupational Performance: May have comorbidities impacting occupational performance    Modification or Assistance to Complete Evaluation  Min-Moderate modification of tasks or assist with assess necessary to complete eval    OT Frequency 2x / week    OT Duration 6 weeks    OT Treatment/Interventions Self-care/ADL training;Moist Heat;DME and/or AE instruction;Therapeutic activities;Therapeutic exercise;Cognitive remediation/compensation;Coping strategies training;Functional Mobility Training;Neuromuscular education;Passive range of motion;Manual Therapy;Patient/family education    Plan Continue to address standing tolerance, shoulder ROM/overhead reach, bilateral hand coordination; review memory compensatory strategies (STG4; handout administered this session)    Consulted and Agree with Plan of Care Patient;Family member/caregiver             Patient will benefit from skilled therapeutic intervention in order to improve the following deficits and impairments:   Body Structure / Function / Physical Skills: ADL, Strength, Balance, UE functional use, Body mechanics, Endurance, IADL, ROM, Coordination, Mobility, FMC, Decreased knowledge of precautions Cognitive Skills: Memory, Problem Solve, Safety Awareness, Thought     Visit Diagnosis: Stiffness of left shoulder, not elsewhere classified  Stiffness of right shoulder, not elsewhere classified  Muscle weakness (generalized)  Unsteadiness on feet  Other lack of coordination  Attention and concentration deficit    Problem List Patient Active Problem List   Diagnosis Date Noted   Postural dizziness with presyncope 06/08/2020   Hypercalcemia 06/08/2020   Hypertension    Chronic diastolic CHF (congestive heart failure) (HCC)    Abnormality of gait 05/19/2020   Visual disturbance    Acute blood loss anemia    Schizophrenia (HCC)    Elevated triglycerides with high cholesterol    Controlled type 2 diabetes mellitus with  hyperglycemia (HCC)    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[place]) 03/15/2020   Pressure injury of skin 34/19/3790   Toxic metabolic encephalopathy    AKI (acute kidney injury) ([place])    Dehydration    Pancreatitis    Respiratory failure ([place])    DKA (diabetic ketoacidosis) ([place]) 01/25/2020   MDD (major depressive disorder), recurrent, severe, with psychosis ([place]) 03/12/2018   Paranoid schizophrenia ([place])    Undifferentiated schizophrenia ([place])    Delusional disorder ([place])    Illiterate 10/30/2011   Schizoaffective disorder, depressive type ([place]) 10/27/2011   Schizoaffective disorder ([place]) 08/10/2011   Observed seizure-like activity ([place]) 08/09/2011   PAIN IN JOINT, MULTIPLE SITES 02/16/2010   FATIGUE 02/16/2010   Shortness of breath 02/16/2010    Zachery Conch MOT, OTR/L  09/27/2020, 4:26 PM  [facility] [address] [place], [place], [address] Phone: [phone]   Fax:  [phone]  Name: Krystofer Hevener MRN: [id number] Date of Birth: 18-Jun-1975

## 2020-09-27 NOTE — Therapy (Addendum)
[facility] [address] [name], [place], [address] Phone: [phone]   Fax:  [phone]  Physical Therapy Treatment/Recert  Patient Details  Name: Bryan [name] MRN: [id number] Date of Birth: 12-20-1975 Referring Provider (PT): [name]   Encounter Date: 09/27/2020   PT End of Session - 09/27/20 1320     Visit Number 9    Number of Visits 13    Authorization Type [place] requested upon completion of eval    Authorization Time Period 12  visits from 08/02/20 through 10/03/20    Authorization - Visit Number 8    Authorization - Number of Visits 12    PT Start Time 4580    PT Stop Time 1400    PT Time Calculation (min) 43 min    Equipment Utilized During Treatment Gait belt    Activity Tolerance Patient tolerated treatment well    Behavior During Therapy Flat affect;WFL for tasks assessed/performed             Past Medical History:  Diagnosis Date   Controlled type 2 diabetes mellitus with hyperglycemia ([place])    Hypertension    Psychiatric problem    Seen at [facility] for unknown reason. Takes seroquel. History of hearing voices.  Not commandivng voices.    Schizophrenia ([place])    Tremors of nervous system     Past Surgical History:  Procedure Laterality Date   none     TRACHEOSTOMY TUBE PLACEMENT N/A 02/13/2020   Procedure: TRACHEOSTOMY;  Surgeon: Leta Baptist, MD;  Location: [facility] OR;  Service: ENT;  Laterality: N/A;    There were no vitals filed for this visit.   Subjective Assessment - 09/27/20 1319     Subjective No new complaints. No falls or pain to report. Reports doing his stretches and ex's at home.    Patient is accompained by: Family member   mom [name]   Pertinent History Male with history of HTN, schizophrenia who was admitted 01/25/2020 with hypotension, lethargy and found to have newly diagnosed DM with DKA and AKI and anoxic encephalopathy; was admitted to CIR and d/c home 2/11/ 2022; ED  visit 05/2020    Patient Stated Goals Wants to work on balance, strength, gait    Currently in Pain? No/denies                [facility] PT Assessment - 09/27/20 1321       Berg Balance Test   Sit to Stand Able to stand without using hands and stabilize independently    Standing Unsupported Able to stand safely 2 minutes    Sitting with Back Unsupported but Feet Supported on Floor or Stool Able to sit safely and securely 2 minutes    Stand to Sit Sits safely with minimal use of hands    Transfers Able to transfer safely, definite need of hands   needs RW, no assist or cues   Standing Unsupported with Eyes Closed Able to stand 10 seconds safely    Standing Unsupported with Feet Together Able to place feet together independently and stand for 1 minute with supervision    From Standing, Reach Forward with Outstretched Arm Can reach forward >5 cm safely (2")   4 inches   From Standing Position, Pick up Object from Floor Able to pick up shoe safely and easily    From Standing Position, Turn to Look Behind Over each Shoulder Looks behind one side only/other side shows less weight shift   left>right  Turn 360 Degrees Needs assistance while turning    Standing Unsupported, Alternately Place Feet on Step/Stool Able to complete >2 steps/needs minimal assist   min HHA with pt "kicking"  top edge of box vs tapping top of it   Standing Unsupported, One Foot in Front Able to take small step independently and hold 30 seconds    Standing on One Leg Unable to try or needs assist to prevent fall    Total Score 38    Berg comment: 38/56 scored today- 37-45 significant risk      Timed Up and Go Test   TUG Normal TUG    Normal TUG (seconds) 70.32    TUG Comments Scores >13.5 sec indicate increased fall risk; scores >30 sec indicate increased difficulty with ADLs in the home. increased time needed with turns                     [facility] Adult PT Treatment/Exercise - 09/27/20 1321        Transfers   Transfers Sit to Stand;Stand to Sit    Sit to Stand 5: Supervision;With upper extremity assist;From bed;From chair/3-in-1    Sit to Stand Details (indicate cue type and reason) cues to scoot out to edge of surface and for weight shifting    Stand to Sit 5: Supervision;With upper extremity assist;To bed;To chair/3-in-1    Stand to Sit Details cues to reach back with sitting down      Ambulation/Gait   Ambulation/Gait Yes    Ambulation/Gait Assistance 5: Supervision    Ambulation/Gait Assistance Details cues needed throughout gait for increased bil step length to increase gait speed. no balance issues noted.    Ambulation Distance (Feet) 120 Feet   x1, plus around clinic with session   Assistive device Rolling walker    Gait Pattern Step-through pattern;Decreased step length - right;Decreased step length - left;Ataxic;Decreased hip/knee flexion - left;Decreased hip/knee flexion - right;Decreased dorsiflexion - left;Wide base of support    Ambulation Surface Level;Indoor    Gait velocity 48.57 sec's= 0.68 ft/sec with RW                      PT Short Term Goals - 08/25/20 1253       PT SHORT TERM GOAL #1   Title Pt will perform HEP with family supervision for improved strength, balance, transfers, and gait.  TARGET 08/19/2020 (delayed one week due to [facility] scheduling)    Baseline no current HEP; HEP issued 08/25/2020    Time 4    Period Weeks    Status On-going      PT SHORT TERM GOAL #2   Title Pt will perform sit<>stand 5 of 5 trials, mod independently and no LOB upon standing to demonstrate improved functional strength and transfer efficiency.    Baseline Supervision /mod I for sit<>stand    Time 4    Period Weeks    Status Partially Met      PT SHORT TERM GOAL #3   Title Berg Balance score to be assessed and LTG to be written to address balance/decreased fall risk.    Baseline 08/16/20: 30/56 scored today as baseline score with primary PT to update goals     Time 4    Period Weeks    Status Achieved      PT SHORT TERM GOAL #4   Title Pt will improve TUG score to less than or qual to 55 seconds for decreased fall  risk.    Baseline 72.91 sec; 77 sec 08/25/20    Time 4    Period Weeks    Status Not Met      PT SHORT TERM GOAL #5   Title Pt/family will verbalize understanding of fall prevention in home environment.    Baseline Pt at fall risk per TUG and gait velocity scores; 6/30:  not able to address today due to time constraints    Time 4    Period Weeks    Status On-going               PT Long Term Goals - 09/27/20 1630       PT LONG TERM GOAL #1   Title Pt will perform progression of HEP with family supervision for improved strength, balance, transfers, and gait.  TARGET 09/23/2020    Baseline 09/27/20: pt reports doing his HEP, confirmed by mom    Status Achieved      PT LONG TERM GOAL #2   Title Pt will improve gait velocity to at least 1.3 ft/sec for improved gait efficiency and safety.    Baseline 09/27/20: 0.68 ft/sec with RW, decreased from last assessment of 0.96 ft/sec    Status Not Met      PT LONG TERM GOAL #3   Title Pt will improve Berg score by at least 10 points from baseline to decrease fall risk.    Baseline 09/27/20: 38/56 scored today, improved from last assessment of 30/56, an 8 point improvement.    Time --    Period --    Status Partially Met      PT LONG TERM GOAL #4   Title Pt will improve TUG score to less than or equal to 45 sec for decreased fall risk.    Baseline 09/27/20: 70.32 sec's, improved slightly from 72.91 secs, not to goal level    Time --    Period --    Status Partially Met      PT LONG TERM GOAL #5   Title Pt will ambulate at least 50-100 ft, short household distances, with least restrictive assistive device and supervision, for improved independence with gait.    Baseline 09/27/20: 115 with RW with supervision    Time --    Period --    Status Achieved                    Plan - 09/27/20 1320     Clinical Impression Statement Today's skilled session focused on progress toward LTGs with primary PT planning to recert to continue to address strengthening, gait and balance. Most goals partially to fully met except for gait speed goal which was not met. No issues noted or reported in session. The pt is progressing toward goals and should benefit from continued PT to progress toward unmet goals.    Personal Factors and Comorbidities Comorbidity 3+    Comorbidities HTN, DM, schizophrenia    Examination-Activity Limitations Locomotion Level;Transfers;Stairs;Stand    Examination-Participation Restrictions Community Activity;Other   sports   Stability/Clinical Decision Making Evolving/Moderate complexity    Rehab Potential Good    PT Frequency Other (comment)   2x/wk for 3 weeks, then 1x/wk for 6 weeks   PT Duration Other (comment)   9 wk POC   PT Treatment/Interventions ADLs/Self Care Home Management;Gait training;Stair training;Functional mobility training;Therapeutic activities;Therapeutic exercise;Balance training;Neuromuscular re-education;DME Instruction;Electrical Stimulation;Manual techniques;Orthotic Fit/Training;Patient/family education    PT Next Visit Plan At some point, discuss fall prevention to check STG  5 (could not do today due to time constraints with other POC discussion).  Review HEP updates   Lower extremity strength (weightbearing to break up extensor tone-sit<>stand, squats), need to try standing exercises-resistance through hips and work on lessening UE support with standing; work on aerobic machines (maybe at [employer]; work in parallel bars and progress to gait with lesser device as safely appropriate    PT Home Exercise Plan Access Code [email or web address]; additional Medbridge Access Code: [facility] 08/25/20    Consulted and Agree with Plan of Care Patient;Family member/caregiver    Family Member Consulted Mom             Patient will benefit from  skilled therapeutic intervention in order to improve the following deficits and impairments:  Abnormal gait, Difficulty walking, Decreased balance, Impaired flexibility, Decreased mobility, Decreased strength, Impaired tone  Visit Diagnosis: Muscle weakness (generalized)  Unsteadiness on feet  Other abnormalities of gait and mobility     Problem List Patient Active Problem List   Diagnosis Date Noted   Postural dizziness with presyncope 06/08/2020   Hypercalcemia 06/08/2020   Hypertension    Chronic diastolic CHF (congestive heart failure) (HCC)    Abnormality of gait 05/19/2020   Visual disturbance    Acute blood loss anemia    Schizophrenia (HCC)    Elevated triglycerides with high cholesterol    Controlled type 2 diabetes mellitus with hyperglycemia (HCC)    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[place]) 03/15/2020   Pressure injury of skin 26/71/2458   Toxic metabolic encephalopathy    AKI (acute kidney injury) ([place])    Dehydration    Pancreatitis    Respiratory failure ([place])    DKA (diabetic ketoacidosis) ([place]) 01/25/2020   MDD (major depressive disorder), recurrent, severe, with psychosis ([place]) 03/12/2018   Paranoid schizophrenia ([place])    Undifferentiated schizophrenia ([place])    Delusional disorder ([place])    Illiterate 10/30/2011   Schizoaffective disorder, depressive type ([place]) 10/27/2011   Schizoaffective disorder ([place]) 08/10/2011   Observed seizure-like activity ([place]) 08/09/2011   PAIN IN JOINT, MULTIPLE SITES 02/16/2010   FATIGUE 02/16/2010   Shortness of breath 02/16/2010    Willow Ora, PTA, [facility] [address], [place], [place] [address] [phone] 09/28/20, 2:19 PM   Name: Bryan [name] MRN: [id number] Date of Birth: [birth year]   For Recertification:   PT End of Session - 09/29/20 0722     Visit Number 9    Number of Visits 13    Date for PT Re-Evaluation 32/99/24   per recert 04/03/81   Authorization Type Medicaid     Authorization Time Period 12  visits from 08/02/20 through 10/03/20    Authorization - Visit Number 8    Authorization - Number of Visits 12    Equipment Utilized During Treatment Gait belt    Activity Tolerance Patient tolerated treatment well    Behavior During Therapy Flat affect;WFL for tasks assessed/performed             Plan - 09/29/20 0725     Clinical Impression Statement Today's skilled session focused on progress toward LTGs with primary PT planning to recert to continue to address strengthening, gait and balance. Most goals partially to fully met except for gait speed goal which was not met. No issues noted or reported in session. The pt is progressing toward goals and should benefit from continued PT to progress toward unmet goals.   PT NOTE:  Pt progressing towards goals and recert  completed.  Pt will continue to benefit from skilled PT to further progress towards goals of improved mobility and decreased fall risk.   Personal Factors and Comorbidities Comorbidity 3+    Comorbidities HTN, DM, schizophrenia    Examination-Activity Limitations Locomotion Level;Transfers;Stairs;Stand    Examination-Participation Restrictions Community Activity;Other   sports   Stability/Clinical Decision Making Evolving/Moderate complexity    Rehab Potential Good    PT Frequency 1x / week   per recert 09/03/27   PT Duration 4 weeks    PT Treatment/Interventions ADLs/Self Care Home Management;Gait training;Stair training;Functional mobility training;Therapeutic activities;Therapeutic exercise;Balance training;Neuromuscular re-education;DME Instruction;Electrical Stimulation;Manual techniques;Orthotic Fit/Training;Patient/family education    PT Next Visit Plan At some point, discuss fall prevention to check STG 5 (could not do today due to time constraints with other POC discussion).  Review HEP updates   Lower extremity strength (weightbearing to break up extensor tone-sit<>stand, squats), need to try  standing exercises-resistance through hips and work on lessening UE support with standing; work on aerobic machines (maybe at [employer]; work in parallel bars and progress to gait with lesser device as safely appropriate    PT Home Exercise Plan Access Code [email or web address]; additional Medbridge Access Code: [facility] 08/25/20    Consulted and Agree with Plan of Care Patient;Family member/caregiver    Family Member Consulted Mom             PT Long Term Goals - 09/29/20 5621       PT LONG TERM GOAL #1   Title Pt will performfinal progression of HEP with family supervision for improved strength, balance, transfers, and gait.  TARGET 11/11/2020 (4 week goals, delayed time frame due to Medicaid auth/scheduling)    Baseline 09/27/20: pt reports doing his HEP, confirmed by mom; continued weakness and decreased balance to be addressed by updates to HEP prior to d/c    Time 4    Period Weeks    Status Revised      PT LONG TERM GOAL #2   Title Pt will improve gait velocity to at least 1 ft/sec for improved gait efficiency and safety.    Baseline 09/27/20: 0.68 ft/sec with RW, decreased from last assessment of 0.96 ft/sec    Time 4    Period Weeks    Status Revised      PT LONG TERM GOAL #3   Title Pt/family will verbalize understanding of fall prevention in home environment.    Baseline 09/27/20:  at fall risk per Merrilee Jansky score 38/56 and TUG score of 70 sec    Time 4    Period Weeks    Status New      PT LONG TERM GOAL #4   Title Pt will improve TUG score to less than or equal to 60 sec for decreased fall risk.    Baseline 09/27/20: 70.32 sec's, improved slightly from 72.91 secs, not to goal level    Time 4    Period Weeks    Status Revised      PT LONG TERM GOAL #5   Title Pt will ambulate at least 200 ft, with supervision, with RW, for improved independence with gait.    Baseline 09/27/20: 115 with RW with supervision    Time 4    Period Weeks    Status Revised            Mady Haagensen,  PT 09/29/20 7:34 AM Phone: [phone] Fax: [phone]

## 2020-09-29 ENCOUNTER — Ambulatory Visit: Payer: Medicaid Other | Admitting: Occupational Therapy

## 2020-09-29 NOTE — Addendum Note (Signed)
Addended by: Gean Maidens on: 09/29/2020 07:36 AM   Modules accepted: Orders

## 2020-10-04 ENCOUNTER — Ambulatory Visit: Payer: Medicaid Other | Admitting: Occupational Therapy

## 2020-10-06 ENCOUNTER — Ambulatory Visit: Payer: Medicaid Other | Admitting: Physical Therapy

## 2020-10-11 ENCOUNTER — Other Ambulatory Visit: Payer: Self-pay

## 2020-10-11 ENCOUNTER — Encounter: Payer: Self-pay | Admitting: Occupational Therapy

## 2020-10-11 ENCOUNTER — Ambulatory Visit: Payer: Medicaid Other | Admitting: Occupational Therapy

## 2020-10-11 DIAGNOSIS — M25612 Stiffness of left shoulder, not elsewhere classified: Secondary | ICD-10-CM | POA: Diagnosis not present

## 2020-10-11 DIAGNOSIS — R278 Other lack of coordination: Secondary | ICD-10-CM

## 2020-10-11 DIAGNOSIS — M6281 Muscle weakness (generalized): Secondary | ICD-10-CM

## 2020-10-11 DIAGNOSIS — M25611 Stiffness of right shoulder, not elsewhere classified: Secondary | ICD-10-CM

## 2020-10-11 DIAGNOSIS — R41840 Attention and concentration deficit: Secondary | ICD-10-CM

## 2020-10-11 DIAGNOSIS — R2681 Unsteadiness on feet: Secondary | ICD-10-CM

## 2020-10-11 NOTE — Therapy (Signed)
[facility] [address] [place], [place], [address] Phone: [phone]   Fax:  [phone]  Occupational Therapy Treatment  Patient Details  Name: Bryan [name] MRN: [id number] Date of Birth: August 25, 1975 Referring Provider (OT): Dr. Jeanie Cooks   Encounter Date: 10/11/2020   OT End of Session - 10/11/20 1443     Visit Number 9    Number of Visits 15    Date for OT Re-Evaluation 10/21/20    Authorization Type MCD    Authorization Time Period CCME approval: 12 OT visits from 09/14/20 - 10/25/20    Authorization - Visit Number 8    Authorization - Number of Visits 12    Progress Note Due on Visit 10    OT Start Time 1233    OT Stop Time 1315    OT Time Calculation (min) 42 min    Activity Tolerance Patient tolerated treatment well    Behavior During Therapy [facility] for tasks assessed/performed             Past Medical History:  Diagnosis Date   Controlled type 2 diabetes mellitus with hyperglycemia ([place])    Hypertension    Psychiatric problem    Seen at [facility] for unknown reason. Takes seroquel. History of hearing voices.  Not commandivng voices.    Schizophrenia ([place])    Tremors of nervous system     Past Surgical History:  Procedure Laterality Date   none     TRACHEOSTOMY TUBE PLACEMENT N/A 02/13/2020   Procedure: TRACHEOSTOMY;  Surgeon: Leta Baptist, MD;  Location: [facility] OR;  Service: ENT;  Laterality: N/A;    There were no vitals filed for this visit.   Subjective Assessment - 10/11/20 1433     Subjective  I try to stand up tall    Patient is accompanied by: Family member    Pertinent History Went to ED 01/25/20 with DKA and AKI w/ newly diagnosed DM. Pt developed metabolic encephalopathy and anoxic brain damage as a result. PMH: schizoaffective disorder, HTN    Limitations fall risk    Currently in Pain? No/denies                          OT Treatments/Exercises (OP) - 10/11/20 0001        ADLs   Toileting Patient needed assist ti manage door in bathroom for toileting,  Patient able to void, and stand at sink to wash hands - unable to stand without UE support without leaning on towel dispenser.  Worked with patient to stand in midline - needed facilitation to accept weight onto LLE.  Worked to navigate door to leave bathroom - walker placement to avoid collision.  Patient resistant initially of weight shift forward, but with repetition and mod facilitation for trunk, hip extension and weight shift - able to take larger step with RUE.  Cueing to reduce pressure thru hands with functional ambulation.      Neurological Re-education Exercises   Other Exercises 1 Worked on forward weight shift, and midline orientation for sit to stand. Patient very stiff in trunkwith increased muscle tension.  Worked on increasing lower trunk initiated weight shifts via sitting on physionball.  Patient verbalizing fear - mom very encouraging.  Decreased stiffness in trunk noted afterward.                      OT Short Term Goals - 10/11/20 1446  OT SHORT TERM GOAL #1   Title Independent with bilateral shoulder and coordination HEP    Baseline dependent    Time 4    Period Weeks    Status Achieved      OT SHORT TERM GOAL #2   Title Pt to write full name at 75% or greater legibility and cut food w/ A/E PRN    Baseline 50% first name only, dependent for cutting food    Time 4    Period Weeks    Status On-going      OT SHORT TERM GOAL #3   Title Pt to perform all BADLS at mod I level    Baseline requires set up/supervision    Time 4    Period Weeks    Status On-going      OT SHORT TERM GOAL #4   Title Pt/family to verbalize understanding with memory strategies    Baseline dependent    Time 4    Period Weeks    Status On-going      OT SHORT TERM GOAL #5   Title Pt to improve coordination bilaterally as evidenced by reducing speed on 9 hole peg test to under 60 sec. Rt  hand and under 2 min. Lt hand    Baseline Rt = 74.09 sec, Lt = 2 min. 36 sec    Time 4    Period Weeks    Status Partially Met   09/15/20: 9-HPT in 1 min, 39 sec w/ Lt hand              OT Long Term Goals - 09/22/20 1348       OT LONG TERM GOAL #1   Title Independent with udpated HEP    Baseline dependent    Time 10    Period Weeks    Status On-going      OT LONG TERM GOAL #2   Title Pt to perform dynamic standing tasks for 15 minutes w/o rest or LOB    Baseline requires assist - unable to tolerate 15 min    Time 10    Period Weeks    Status On-going      OT LONG TERM GOAL #3   Title Pt to perform simple IADLS in standing (washing dishes, folding towels, make sandwich)    Baseline dependent    Time 10    Period Weeks    Status On-going      OT LONG TERM GOAL #4   Title Improve grip strength Lt hand to 60 lbs or greater    Baseline 53 lbs (Rt = 83.9 lbs)    Time 10    Period Weeks    Status On-going      OT LONG TERM GOAL #5   Title Pt to perform overhead reaching LUE to retrieve light weight object from high shelf    Baseline midrange only    Time 10    Period Weeks    Status On-going                   Plan - 10/11/20 1444     Clinical Impression Statement Pt with significant stiffness in trunk and limbs left>right.  Receptive to faciltation techniques, yet fearful. Sent message to MD regarding potential for medical management of hypertonicity.    OT Occupational Profile and History Detailed Assessment- Review of Records and additional review of physical, cognitive, psychosocial history related to current functional performance    Occupational performance deficits (Please  refer to evaluation for details): ADL's;IADL's;Social Participation;Leisure    Body Structure / Function / Physical Skills ADL;Strength;Balance;UE functional use;Body mechanics;Endurance;IADL;ROM;Coordination;Mobility;FMC;Decreased knowledge of precautions    Cognitive Skills  Memory;Problem Solve;Safety Awareness;Thought    Rehab Potential Good    Clinical Decision Making Several treatment options, min-mod task modification necessary    Comorbidities Affecting Occupational Performance: May have comorbidities impacting occupational performance    Modification or Assistance to Complete Evaluation  Min-Moderate modification of tasks or assist with assess necessary to complete eval    OT Frequency 2x / week    OT Duration 6 weeks    OT Treatment/Interventions Self-care/ADL training;Moist Heat;DME and/or AE instruction;Therapeutic activities;Therapeutic exercise;Cognitive remediation/compensation;Coping strategies training;Functional Mobility Training;Neuromuscular education;Passive range of motion;Manual Therapy;Patient/family education    Plan Check STG - 10th visit - Needs functional mobility and improved postural control.  Continue to address standing tolerance, shoulder ROM/overhead reach, bilateral hand coordination; review memory compensatory strategies (STG4; handout administered this session)    Consulted and Agree with Plan of Care Patient;Family member/caregiver             Patient will benefit from skilled therapeutic intervention in order to improve the following deficits and impairments:   Body Structure / Function / Physical Skills: ADL, Strength, Balance, UE functional use, Body mechanics, Endurance, IADL, ROM, Coordination, Mobility, FMC, Decreased knowledge of precautions Cognitive Skills: Memory, Problem Solve, Safety Awareness, Thought     Visit Diagnosis: Stiffness of left shoulder, not elsewhere classified  Stiffness of right shoulder, not elsewhere classified  Other lack of coordination  Attention and concentration deficit  Unsteadiness on feet  Muscle weakness (generalized)    Problem List Patient Active Problem List   Diagnosis Date Noted   Postural dizziness with presyncope 06/08/2020   Hypercalcemia 06/08/2020    Hypertension    Chronic diastolic CHF (congestive heart failure) (HCC)    Abnormality of gait 05/19/2020   Visual disturbance    Acute blood loss anemia    Schizophrenia (H[facility])    Elevated triglycerides with high cholesterol    Controlled type 2 diabetes mellitus with hyperglycemia (H[facility])    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[place]) 03/15/2020   Pressure injury of skin 81/27/5170   Toxic metabolic encephalopathy    AKI (acute kidney injury) ([place])    Dehydration    Pancreatitis    Respiratory failure ([place])    DKA (diabetic ketoacidosis) ([place]) 01/25/2020   MDD (major depressive disorder), recurrent, severe, with psychosis ([place]) 03/12/2018   Paranoid schizophrenia ([place])    Undifferentiated schizophrenia ([place])    Delusional disorder ([place])    Illiterate 10/30/2011   Schizoaffective disorder, depressive type ([place]) 10/27/2011   Schizoaffective disorder ([place]) 08/10/2011   Observed seizure-like activity ([place]) 08/09/2011   PAIN IN JOINT, MULTIPLE SITES 02/16/2010   FATIGUE 02/16/2010   Shortness of breath 02/16/2010    Mariah Milling 10/11/2020, 2:47 PM  [facility] [address] [place], [place], [address] Phone: [phone]   Fax:  [phone]  Name: Little Winton MRN: [id number] Date of Birth: [birth year]

## 2020-10-13 ENCOUNTER — Ambulatory Visit: Payer: Medicaid Other | Admitting: Occupational Therapy

## 2020-10-13 ENCOUNTER — Encounter: Payer: Self-pay | Admitting: Physical Therapy

## 2020-10-13 ENCOUNTER — Other Ambulatory Visit: Payer: Self-pay

## 2020-10-13 ENCOUNTER — Ambulatory Visit: Payer: Medicaid Other | Admitting: Physical Therapy

## 2020-10-13 DIAGNOSIS — R2681 Unsteadiness on feet: Secondary | ICD-10-CM

## 2020-10-13 DIAGNOSIS — M6281 Muscle weakness (generalized): Secondary | ICD-10-CM

## 2020-10-13 DIAGNOSIS — M25612 Stiffness of left shoulder, not elsewhere classified: Secondary | ICD-10-CM | POA: Diagnosis not present

## 2020-10-13 DIAGNOSIS — R2689 Other abnormalities of gait and mobility: Secondary | ICD-10-CM

## 2020-10-13 NOTE — Therapy (Signed)
[facility] [address] [place], [place], [address] Phone: [phone]   Fax:  [phone]  Physical Therapy Treatment  Patient Details  Name: Bryan [name] MRN: [id number] Date of Birth: [birth year] Referring Provider (PT): [name]   Encounter Date: 10/13/2020   PT End of Session - 10/13/20 1427     Visit Number 10    Number of Visits 13    Date for PT Re-Evaluation 11/11/20   per recert 09/27/20   Authorization Type Medicaid    Authorization Time Period 12  visits from 08/02/20 through 10/03/20; 4 visits from 10/06/20-11/02/20    Authorization - Visit Number 1    Authorization - Number of Visits 4    PT Start Time 1318    PT Stop Time 1408   approx 5 minutes in restroom   PT Time Calculation (min) 50 min    Equipment Utilized During Treatment Gait belt    Activity Tolerance Patient tolerated treatment well    Behavior During Therapy Flat affect;WFL for tasks assessed/performed             Past Medical History:  Diagnosis Date   Controlled type 2 diabetes mellitus with hyperglycemia (HCC)    Hypertension    Psychiatric problem    Seen at [facility] for unknown reason. Takes seroquel. History of hearing voices.  Not commandivng voices.    Schizophrenia (HCC)    Tremors of nervous system     Past Surgical History:  Procedure Laterality Date   none     TRACHEOSTOMY TUBE PLACEMENT N/A 02/13/2020   Procedure: TRACHEOSTOMY;  Surgeon: Newman Pies, MD;  Location: [facility] OR;  Service: ENT;  Laterality: N/A;    There were no vitals filed for this visit.   Subjective Assessment - 10/13/20 1417     Subjective No changes per report.  No falls.    Patient is accompained by: Family member   mom Bryan [name]   Pertinent History Male with history of HTN, schizophrenia who was admitted 01/25/2020 with hypotension, lethargy and found to have newly diagnosed DM with DKA and AKI and anoxic encephalopathy; was admitted to CIR and d/c home  2/11/ 2022; ED visit 05/2020    Patient Stated Goals Wants to work on balance, strength, gait    Currently in Pain? No/denies                               [facility] Adult PT Treatment/Exercise - 10/13/20 0001       Transfers   Transfers Sit to Stand;Stand to Sit    Sit to Stand 5: Supervision;With upper extremity assist;From bed;From chair/3-in-1    Sit to Stand Details Verbal cues for sequencing;Verbal cues for technique    Sit to Stand Details (indicate cue type and reason) Cues for upright posture upon standing    Stand to Sit 5: Supervision;With upper extremity assist;To bed;To chair/3-in-1      Ambulation/Gait   Ambulation/Gait Yes    Ambulation/Gait Assistance 5: Supervision;Other (comment)   Min assist provided through hips for lateral weightshift with VCs for continuous step through pattern.   Ambulation/Gait Assistance Details Verbal and tactile cues provided to assist with continuous step pattern (versus step-to/stop/move RW pattern), and for upright posture.    Ambulation Distance (Feet) 80 Feet   x 2, 40 ft x 2   Assistive device Rolling walker    Gait Pattern Step-through pattern;Decreased step length - right;Decreased step  length - left;Ataxic;Decreased hip/knee flexion - left;Decreased hip/knee flexion - right;Decreased dorsiflexion - left;Wide base of support    Ambulation Surface Level;Indoor      Exercises   Exercises Other Exercises    Other Exercises  Trunk control exercises:  seated forward trunk flexion to upright sitting, x 5 reps, 2 sets; seated tall posture with cross body reaches to target, x 5 reps; seated lateral weightshifting x 10 reps, seated trunk rotation R and L, with brief stop in midline x 5 reps each side, then seated step out/in, x 5 reps, 2 sets, each side.  Multi-modal cues for technique and to reset posture in midline.      Knee/Hip Exercises: Aerobic   Nustep NuStep, Level 2, lower extremities only, x 5 minutes, cues and manual  assistance for continuous pattern and for upright posture, to decreased R lateral lean.      Knee/Hip Exercises: Standing   Functional Squat 5 sets;2 sets;Limitations    Functional Squat Limitations Stand at edge of mat, wtih squat to touch bolster on mat behind buttocks, with initial tactile cues for hip flexion,  and VCs/tactile cues ofr upright posture upon standing.                    PT Education - 10/13/20 1427     Education Details Additions to HEP; see instructions    Person(s) Educated Patient;Parent(s)    Methods Explanation;Demonstration;Verbal cues;Handout    Comprehension Verbalized understanding;Returned demonstration;Verbal cues required              PT Short Term Goals - 09/29/20 0727       PT SHORT TERM GOAL #1   Title STGs=LTGs               PT Long Term Goals - 09/29/20 0728       PT LONG TERM GOAL #1   Title Pt will performfinal progression of HEP with family supervision for improved strength, balance, transfers, and gait.  TARGET 11/11/2020 (4 week goals, delayed time frame due to Medicaid auth/scheduling)    Baseline 09/27/20: pt reports doing his HEP, confirmed by mom; continued weakness and decreased balance to be addressed by updates to HEP prior to d/c    Time 4    Period Weeks    Status Revised      PT LONG TERM GOAL #2   Title Pt will improve gait velocity to at least 1 ft/sec for improved gait efficiency and safety.    Baseline 09/27/20: 0.68 ft/sec with RW, decreased from last assessment of 0.96 ft/sec    Time 4    Period Weeks    Status Revised      PT LONG TERM GOAL #3   Title Pt/family will verbalize understanding of fall prevention in home environment.    Baseline 09/27/20:  at fall risk per Sharlene Motts score 38/56 and TUG score of 70 sec    Time 4    Period Weeks    Status New      PT LONG TERM GOAL #4   Title Pt will improve TUG score to less than or equal to 60 sec for decreased fall risk.    Baseline 09/27/20: 70.32 sec's,  improved slightly from 72.91 secs, not to goal level    Time 4    Period Weeks    Status Revised      PT LONG TERM GOAL #5   Title Pt will ambulate at least 200 ft, with supervision, with  RW, for improved independence with gait.    Baseline 09/27/20: 115 with RW with supervision    Time 4    Period Weeks    Status Revised                   Plan - 10/13/20 1429     Clinical Impression Statement Skilled PT session today focused on trunk control, lower extremity strengthening, and gait training.  Pt responds well to cues for taller posture for increased step length and continuous step pattern, initially requiring tactile cues through hips for weightshifting, then able to perform with VCs only.  He continues to have heavy lean through BUEs.  Added to HEP today for addtional trunk and lower extremity exercises, with mom present for instruction.    Personal Factors and Comorbidities Comorbidity 3+    Comorbidities HTN, DM, schizophrenia    Examination-Activity Limitations Locomotion Level;Transfers;Stairs;Stand    Examination-Participation Restrictions Community Activity;Other   sports   Stability/Clinical Decision Making Evolving/Moderate complexity    Rehab Potential Good    PT Frequency 1x / week   per recert 09/27/20   PT Duration 4 weeks    PT Treatment/Interventions ADLs/Self Care Home Management;Gait training;Stair training;Functional mobility training;Therapeutic activities;Therapeutic exercise;Balance training;Neuromuscular re-education;DME Instruction;Electrical Stimulation;Manual techniques;Orthotic Fit/Training;Patient/family education    PT Next Visit Plan At some point, discuss fall prevention to check STG 5. Review HEP updates given 10/13/20 visit.   Lower extremity strength (weightbearing to break up extensor tone-sit<>stand, squats), need to try standing exercises-resistance through hips and work on lessening UE support with standing; work on aerobic machines (maybe at  [employer]/after session; work in parallel bars.  Please note we have 3 more appointments left, with likely plans for d/c due to visit limitations.    PT Home Exercise Plan Access Code [email or web address]; additional Medbridge [email or web address]    Consulted and Agree with Plan of Care Patient;Family member/caregiver    Family Member Consulted Mom             Patient will benefit from skilled therapeutic intervention in order to improve the following deficits and impairments:  Abnormal gait, Difficulty walking, Decreased balance, Impaired flexibility, Decreased mobility, Decreased strength, Impaired tone  Visit Diagnosis: Other abnormalities of gait and mobility  Muscle weakness (generalized)  Unsteadiness on feet     Problem List Patient Active Problem List   Diagnosis Date Noted   Postural dizziness with presyncope 06/08/2020   Hypercalcemia 06/08/2020   Hypertension    Chronic diastolic CHF (congestive heart failure) (HCC)    Abnormality of gait 05/19/2020   Visual disturbance    Acute blood loss anemia    Schizophrenia (HCC)    Elevated triglycerides with high cholesterol    Controlled type 2 diabetes mellitus with hyperglycemia (HCC)    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   Pressure injury of skin 02/16/2020   Toxic metabolic encephalopathy    AKI (acute kidney injury) (HCC)    Dehydration    Pancreatitis    Respiratory failure (HCC)    DKA (diabetic ketoacidosis) (HCC) 01/25/2020   MDD (major depressive disorder), recurrent, severe, 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   Paranoid schizophrenia (HCC)    Undifferentiated schizophrenia (HCC)    Delusional disorder (HCC)    Illiterate 10/30/2011   Schizoaffective disorder, depressive type (HCC) 10/27/2011   Schizoaffective disorder (HCC) 08/10/2011   Observed seizure-like activity (HCC) 08/09/2011   PAIN IN JOINT, MULTIPLE SITES 02/16/2010   FATIGUE 02/16/2010   Shortness of breath 02/16/2010  Alaiyah Bollman  W. 10/13/2020, 2:47 PM Gean Maidens., PT   [facility] [address] [place], [place], [address] Phone: [phone]   Fax:  [phone]  Name: Bryan [name] MRN: [id number] Date of Birth: January 16, 1976

## 2020-10-13 NOTE — Patient Instructions (Signed)
Provided handouts for:  1)forward trunk lean to upright posture, 5-10 reps  2) seated trunk rotation<>midline, 5-10 reps each side  3)seated side step out/in, 5-10 reps each side

## 2020-10-18 ENCOUNTER — Ambulatory Visit: Payer: Medicaid Other | Admitting: Occupational Therapy

## 2020-10-18 ENCOUNTER — Encounter: Payer: Self-pay | Admitting: Physical Therapy

## 2020-10-18 ENCOUNTER — Other Ambulatory Visit: Payer: Self-pay

## 2020-10-18 ENCOUNTER — Ambulatory Visit: Payer: Medicaid Other | Admitting: Physical Therapy

## 2020-10-18 DIAGNOSIS — R41840 Attention and concentration deficit: Secondary | ICD-10-CM

## 2020-10-18 DIAGNOSIS — R2681 Unsteadiness on feet: Secondary | ICD-10-CM

## 2020-10-18 DIAGNOSIS — M25612 Stiffness of left shoulder, not elsewhere classified: Secondary | ICD-10-CM

## 2020-10-18 DIAGNOSIS — M6281 Muscle weakness (generalized): Secondary | ICD-10-CM

## 2020-10-18 DIAGNOSIS — R278 Other lack of coordination: Secondary | ICD-10-CM

## 2020-10-18 DIAGNOSIS — R2689 Other abnormalities of gait and mobility: Secondary | ICD-10-CM

## 2020-10-18 NOTE — Therapy (Signed)
[facility] [address] [place], [place], [address] Phone: [phone]   Fax:  [phone]  Occupational Therapy Treatment  Patient Details  Name: Bryan [name] MRN: [id number] Date of Birth: 1975-10-22 Referring Provider (OT): Dr. Jeanie Cooks   Encounter Date: 10/18/2020   OT End of Session - 10/18/20 6962     Visit Number 10    Number of Visits 15    Date for OT Re-Evaluation 10/21/20    Authorization Type MCD    Authorization Time Period CCME approval: 12 OT visits from 09/14/20 - 10/25/20    Authorization - Visit Number 9    Authorization - Number of Visits 12    OT Start Time 1240    OT Stop Time 1315    OT Time Calculation (min) 35 min    Activity Tolerance Patient tolerated treatment well    Behavior During Therapy [facility] for tasks assessed/performed             Past Medical History:  Diagnosis Date   Controlled type 2 diabetes mellitus with hyperglycemia ([place])    Hypertension    Psychiatric problem    Seen at [facility] for unknown reason. Takes seroquel. History of hearing voices.  Not commandivng voices.    Schizophrenia ([place])    Tremors of nervous system     Past Surgical History:  Procedure Laterality Date   none     TRACHEOSTOMY TUBE PLACEMENT N/A 02/13/2020   Procedure: TRACHEOSTOMY;  Surgeon: Leta Baptist, MD;  Location: [facility] OR;  Service: ENT;  Laterality: N/A;    There were no vitals filed for this visit.   Subjective Assessment - 10/18/20 1237     Subjective  I try to stand up tall    Patient is accompanied by: Family member   mother   Pertinent History Went to ED 01/25/20 with DKA and AKI w/ newly diagnosed DM. Pt developed metabolic encephalopathy and anoxic brain damage as a result. PMH: schizoaffective disorder, HTN    Limitations fall risk    Currently in Pain? No/denies             Assessed progress towards goals for renewal - see below for details.   Pt required cues to swing  Lt foot through w/ ambulation and to shift weight onto Lt leg w/ standing and side stepping at counter.   Practiced standing at counter w/ 1 hand counter support and functional reaching w/ CGA.  Pt able to reach higher shelf w/ RUE but only approx 70* flex w/ LUE. Practiced side stepping along counter bilaterally w/ reluctance towards Lt side.  Pt required encouragement for standing balance and transfers.                        OT Short Term Goals - 10/18/20 1438       OT SHORT TERM GOAL #1   Title Independent with bilateral shoulder and coordination HEP    Baseline dependent    Time 4    Period Weeks    Status Achieved      OT SHORT TERM GOAL #2   Title Pt to write full name at 75% or greater legibility and cut food w/ A/E PRN    Baseline 50% first name only, dependent for cutting food    Time 4    Period Weeks    Status Partially Met   met for writing: full name in print (block letters) at 95% legibility, pt  has not attempted to cut food     OT SHORT TERM GOAL #3   Title Pt to perform all BADLS at mod I level    Baseline requires set up/supervision    Time 4    Period Weeks    Status Not Met   still requires supervision/set up     OT SHORT TERM GOAL #4   Title Pt/family to verbalize understanding with memory strategies    Baseline dependent    Time 4    Period Weeks    Status Achieved   per mother     OT SHORT TERM GOAL #5   Title Pt to improve coordination bilaterally as evidenced by reducing speed on 9 hole peg test to under 60 sec. Rt hand and under 2 min. Lt hand    Baseline Rt = 74.09 sec, Lt = 2 min. 36 sec    Time 4    Period Weeks    Status Partially Met   09/15/20: 9-HPT in 1 min, 39 sec w/ Lt hand. 10/18/20: Rt hand = 75 sec, Lt hand = 1 min. 23 sec              OT Long Term Goals - 10/18/20 1441       OT LONG TERM GOAL #1   Title Independent with udpated HEP    Baseline dependent    Time 10    Period Weeks    Status Deferred       OT LONG TERM GOAL #2   Title Pt to perform dynamic standing tasks for 10 minutes w/o rest or LOB    Baseline requires assist - unable to tolerate 10 min    Time 10    Period Weeks    Status Revised   currently 5 min w/ countertop support     OT LONG TERM GOAL #3   Title Pt to perform simple IADLS in standing (washing dishes, folding towels, make sandwich)    Baseline dependent    Time 10    Period Weeks    Status On-going   currently unable     OT LONG TERM GOAL #4   Title Improve grip strength Lt hand to 60 lbs or greater    Baseline 53 lbs (Rt = 83.9 lbs)    Time 10    Period Weeks    Status Achieved   63.4 lbs     OT LONG TERM GOAL #5   Title Pt to perform overhead reaching LUE to retrieve light weight object from high shelf    Baseline midrange only    Time 10    Period Weeks    Status Not Met   approx 70* LUE sh flexion                  Plan - 10/18/20 1446     Clinical Impression Statement Renewal completed today to cover remaining visits after this week. Pt heavily reliant on UE's for standing balance. Pt still requires cues to attend to Lt side and place wt on Lt LE. Pt progressing slowly towards goals - see goal section for updates.    OT Occupational Profile and History Detailed Assessment- Review of Records and additional review of physical, cognitive, psychosocial history related to current functional performance    Occupational performance deficits (Please refer to evaluation for details): ADL's;IADL's;Social Participation;Leisure    Body Structure / Function / Physical Skills ADL;Strength;Balance;UE functional use;Body mechanics;Endurance;IADL;ROM;Coordination;Mobility;FMC;Decreased knowledge of precautions  Cognitive Skills Memory;Problem Solve;Safety Awareness;Thought    Rehab Potential Good    Clinical Decision Making Several treatment options, min-mod task modification necessary    Comorbidities Affecting Occupational Performance: May have  comorbidities impacting occupational performance    Modification or Assistance to Complete Evaluation  Min-Moderate modification of tasks or assist with assess necessary to complete eval    OT Frequency 1x / week    OT Duration 2 weeks   beginning after this current week of 10/17/20   OT Treatment/Interventions Self-care/ADL training;Moist Heat;DME and/or AE instruction;Therapeutic activities;Therapeutic exercise;Cognitive remediation/compensation;Coping strategies training;Functional Mobility Training;Neuromuscular education;Passive range of motion;Manual Therapy;Patient/family education    Plan Pt will need to get scheduled for 2 more visits and follow up for date extension for remaining 2 visits, work on LT shoulder supine, standing balance at counter w/ less UE support in prep for dynamic standing tasks    Consulted and Agree with Plan of Care Patient;Family member/caregiver             Patient will benefit from skilled therapeutic intervention in order to improve the following deficits and impairments:   Body Structure / Function / Physical Skills: ADL, Strength, Balance, UE functional use, Body mechanics, Endurance, IADL, ROM, Coordination, Mobility, FMC, Decreased knowledge of precautions Cognitive Skills: Memory, Problem Solve, Safety Awareness, Thought     Visit Diagnosis: Unsteadiness on feet  Stiffness of left shoulder, not elsewhere classified  Other lack of coordination  Attention and concentration deficit  Muscle weakness (generalized)    Problem List Patient Active Problem List   Diagnosis Date Noted   Postural dizziness with presyncope 06/08/2020   Hypercalcemia 06/08/2020   Hypertension    Chronic diastolic CHF (congestive heart failure) (H[facility])    Abnormality of gait 05/19/2020   Visual disturbance    Acute blood loss anemia    Schizophrenia ([facility])    Elevated triglycerides with high cholesterol    Controlled type 2 diabetes mellitus with hyperglycemia ([facility])     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[place]) 03/15/2020   Pressure injury of skin 74/82/7078   Toxic metabolic encephalopathy    AKI (acute kidney injury) ([place])    Dehydration    Pancreatitis    Respiratory failure ([place])    DKA (diabetic ketoacidosis) ([place]) 01/25/2020   MDD (major depressive disorder), recurrent, severe, with psychosis ([place]) 03/12/2018   Paranoid schizophrenia ([place])    Undifferentiated schizophrenia ([place])    Delusional disorder ([place])    Illiterate 10/30/2011   Schizoaffective disorder, depressive type ([place]) 10/27/2011   Schizoaffective disorder ([place]) 08/10/2011   Observed seizure-like activity ([place]) 08/09/2011   PAIN IN JOINT, MULTIPLE SITES 02/16/2010   FATIGUE 02/16/2010   Shortness of breath 02/16/2010    Carey Bullocks, OTR/L 10/18/2020, 2:58 PM  [facility] [address] [place], [place], [address] Phone: [phone]   Fax:  [phone]  Name: Bryan [name] MRN: [id number] Date of Birth: 03-24-1975

## 2020-10-18 NOTE — Patient Instructions (Addendum)
Access Code: [email or web address] URL: [email or web address] Date: 10/18/2020 Prepared by: Sallyanne Kuster   Patient Education Check for Safety- fall prevention checklist and strategies for in the home.

## 2020-10-19 NOTE — Therapy (Signed)
[facility] [address] [place], [place], [address] Phone: [phone]   Fax:  [phone]  Physical Therapy Treatment  Patient Details  Name: Bryan [name] MRN: [id number] Date of Birth: 11/08/1975 Referring Provider (PT): [name]   Encounter Date: 10/18/2020   PT End of Session - 10/18/20 1319     Visit Number 11    Number of Visits 13    Date for PT Re-Evaluation 38/75/64   per recert 04/29/27   Authorization Type Medicaid    Authorization Time Period 12  visits from 08/02/20 through 10/03/20; 4 visits from 10/06/20-11/02/20    Authorization - Visit Number 2    Authorization - Number of Visits 4    PT Start Time 1315    PT Stop Time 1400    PT Time Calculation (min) 45 min    Equipment Utilized During Treatment Gait belt    Activity Tolerance Patient tolerated treatment well    Behavior During Therapy Flat affect;WFL for tasks assessed/performed             Past Medical History:  Diagnosis Date   Controlled type 2 diabetes mellitus with hyperglycemia ([place])    Hypertension    Psychiatric problem    Seen at [facility] for unknown reason. Takes seroquel. History of hearing voices.  Not commandivng voices.    Schizophrenia ([place])    Tremors of nervous system     Past Surgical History:  Procedure Laterality Date   none     TRACHEOSTOMY TUBE PLACEMENT N/A 02/13/2020   Procedure: TRACHEOSTOMY;  Surgeon: Leta Baptist, MD;  Location: [facility] OR;  Service: ENT;  Laterality: N/A;    There were no vitals filed for this visit.   Subjective Assessment - 10/18/20 1317     Subjective No new complaints. No falls or pain to report.    Patient is accompained by: Family member   mom   Pertinent History Male with history of HTN, schizophrenia who was admitted 01/25/2020 with hypotension, lethargy and found to have newly diagnosed DM with DKA and AKI and anoxic encephalopathy; was admitted to [facility] and d/c home 2/11/ 2022; ED visit  05/2020    Limitations Standing;Walking    Patient Stated Goals Wants to work on balance, strength, gait    Currently in Pain? No/denies                   [facility] Adult PT Treatment/Exercise - 10/18/20 1331       Transfers   Transfers Sit to Stand;Stand to Sit    Sit to Stand 5: Supervision;With upper extremity assist;From bed;From chair/3-in-1    Stand to Sit 5: Supervision;With upper extremity assist;To bed;To chair/3-in-1      Ambulation/Gait   Ambulation/Gait Yes    Ambulation/Gait Assistance 5: Supervision;4: Min guard    Ambulation/Gait Assistance Details PTA providing faclitation at pelvis for increased left lateral weight shifting in stance to allow for increased right step length with gait. 1 gait rep/lap performed after each standing NMR activity to promote carryover of left stance/weight shifting with cues/facilitation still needed.    Ambulation Distance (Feet) 115 Feet   x2   Assistive device Rolling walker    Gait Pattern Step-through pattern;Decreased step length - right;Decreased step length - left;Ataxic;Decreased hip/knee flexion - left;Decreased hip/knee flexion - right;Decreased dorsiflexion - left;Wide base of support    Ambulation Surface Level;Indoor      Self-Care   Self-Care Other Self-Care Comments    Other Self-Care Comments  Pt and mom educated on fall prevention checklist and strategies for the home. No questions from either after review in session. Handouts provided.      Neuro Re-ed    Neuro Re-ed Details  for strengthening/muslce re-ed: with right HHA/left UE support on sturdy surface for right foot forward/backward stepping over black foam beam for 2 sets of 5 reps. cues for increased step length and height; then with same UE support: right foot taps up to 6 inch box<>back down to floor for 2 set's of 5 reps. cues for increased hip flexion. Min guard to min assist for balance with these activities.                      PT Short Term  Goals - 10/19/20 1519       PT SHORT TERM GOAL #1   Title Pt will perform HEP with family supervision for improved strength, balance, transfers, and gait.  TARGET 08/19/2020 (delayed one week due to [facility] scheduling)    Baseline no current HEP; HEP issued 08/25/2020    Status Achieved      PT SHORT TERM GOAL #2   Title Pt will perform sit<>stand 5 of 5 trials, mod independently and no LOB upon standing to demonstrate improved functional strength and transfer efficiency.    Baseline Supervision /mod I for sit<>stand    Status Partially Met      PT SHORT TERM GOAL #3   Title Berg Balance score to be assessed and LTG to be written to address balance/decreased fall risk.    Baseline 08/16/20: 30/56 scored today as baseline score with primary PT to update goals    Status Achieved      PT SHORT TERM GOAL #4   Title Pt will improve TUG score to less than or qual to 55 seconds for decreased fall risk.    Baseline 72.91 sec; 77 sec 08/25/20    Status Not Met      PT SHORT TERM GOAL #5   Title Pt/family will verbalize understanding of fall prevention in home environment.    Baseline 10/19/20: met in session today    Status Achieved               PT Long Term Goals - 09/29/20 0728       PT LONG TERM GOAL #1   Title Pt will performfinal progression of HEP with family supervision for improved strength, balance, transfers, and gait.  TARGET 11/11/2020 (4 week goals, delayed time frame due to Medicaid auth/scheduling)    Baseline 09/27/20: pt reports doing his HEP, confirmed by mom; continued weakness and decreased balance to be addressed by updates to HEP prior to d/c    Time 4    Period Weeks    Status Revised      PT LONG TERM GOAL #2   Title Pt will improve gait velocity to at least 1 ft/sec for improved gait efficiency and safety.    Baseline 09/27/20: 0.68 ft/sec with RW, decreased from last assessment of 0.96 ft/sec    Time 4    Period Weeks    Status Revised      PT LONG TERM  GOAL #3   Title Pt/family will verbalize understanding of fall prevention in home environment.    Baseline 09/27/20:  at fall risk per Berg score 38/56 and TUG score of 70 sec    Time 4    Period Weeks    Status New  PT LONG TERM GOAL #4   Title Pt will improve TUG score to less than or equal to 60 sec for decreased fall risk.    Baseline 09/27/20: 70.32 sec's, improved slightly from 72.91 secs, not to goal level    Time 4    Period Weeks    Status Revised      PT LONG TERM GOAL #5   Title Pt will ambulate at least 200 ft, with supervision, with RW, for improved independence with gait.    Baseline 09/27/20: 115 with RW with supervision    Time 4    Period Weeks    Status Revised                   Plan - 10/18/20 1320     Clinical Impression Statement Today's skilled session focused initially on education on fall prevention strategies to address remaining STG. Pt and mom able to return verbilzation of education provided this session. Remainder of session focused on standing balance tasks to promote increased left stance/weight shifting and gait with RW to work on carry over after balance ex's performed. Pt continues to need cues/facilitation for left LE weight shifting/stance time. The pt is slowly progressing and should benefit from continued PT to progress toward unmet goals.    Personal Factors and Comorbidities Comorbidity 3+    Comorbidities HTN, DM, schizophrenia    Examination-Activity Limitations Locomotion Level;Transfers;Stairs;Stand    Examination-Participation Restrictions Community Activity;Other   sports   Stability/Clinical Decision Making Evolving/Moderate complexity    Rehab Potential Good    PT Frequency 1x / week   per recert 04/01/56   PT Duration 4 weeks    PT Treatment/Interventions ADLs/Self Care Home Management;Gait training;Stair training;Functional mobility training;Therapeutic activities;Therapeutic exercise;Balance training;Neuromuscular  re-education;DME Instruction;Electrical Stimulation;Manual techniques;Orthotic Fit/Training;Patient/family education    PT Next Visit Plan Lower extremity strength (weightbearing to break up extensor tone-sit<>stand, squats), need to try standing exercises-resistance through hips and work on lessening UE support with standing; work on aerobic machines (maybe at [employer]; work in parallel bars.  Please note we have 2 more appointments left, with likely plans for d/c due to visit limitations.    PT Home Exercise Plan Access Code [email or web address]; additional Medbridge Access Code: [facility] 08/25/20    Consulted and Agree with Plan of Care Patient;Family member/caregiver    Family Member Consulted Mom             Patient will benefit from skilled therapeutic intervention in order to improve the following deficits and impairments:  Abnormal gait, Difficulty walking, Decreased balance, Impaired flexibility, Decreased mobility, Decreased strength, Impaired tone  Visit Diagnosis: Unsteadiness on feet  Muscle weakness (generalized)  Other abnormalities of gait and mobility     Problem List Patient Active Problem List   Diagnosis Date Noted   Postural dizziness with presyncope 06/08/2020   Hypercalcemia 06/08/2020   Hypertension    Chronic diastolic CHF (congestive heart failure) (HCC)    Abnormality of gait 05/19/2020   Visual disturbance    Acute blood loss anemia    Schizophrenia (HCC)    Elevated triglycerides with high cholesterol    Controlled type 2 diabetes mellitus with hyperglycemia ([facility])    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[place]) 03/15/2020   Pressure injury of skin 09/98/3382   Toxic metabolic encephalopathy    AKI (acute kidney injury) ([place])    Dehydration    Pancreatitis    Respiratory failure ([place])    DKA (diabetic ketoacidosis) ([place]) 01/25/2020   MDD (major  depressive disorder), recurrent, severe, with psychosis ([place]) 03/12/2018   Paranoid schizophrenia ([place])     Undifferentiated schizophrenia ([place])    Delusional disorder ([place])    Illiterate 10/30/2011   Schizoaffective disorder, depressive type ([place]) 10/27/2011   Schizoaffective disorder ([place]) 08/10/2011   Observed seizure-like activity ([place]) 08/09/2011   PAIN IN JOINT, MULTIPLE SITES 02/16/2010   FATIGUE 02/16/2010   Shortness of breath 02/16/2010    Willow Ora, PTA, [facility] [address], [place], [place] [address] [phone] 10/19/20, 3:24 PM   Name: Bryan [name] MRN: [id number] Date of Birth: Nov 14, 1975

## 2020-10-20 ENCOUNTER — Ambulatory Visit: Payer: Medicaid Other | Admitting: Occupational Therapy

## 2020-10-27 ENCOUNTER — Other Ambulatory Visit: Payer: Self-pay

## 2020-10-27 ENCOUNTER — Ambulatory Visit: Payer: Medicaid Other | Attending: Internal Medicine | Admitting: Physical Therapy

## 2020-10-27 DIAGNOSIS — R2681 Unsteadiness on feet: Secondary | ICD-10-CM | POA: Insufficient documentation

## 2020-10-27 DIAGNOSIS — M6281 Muscle weakness (generalized): Secondary | ICD-10-CM | POA: Diagnosis present

## 2020-10-27 DIAGNOSIS — R2689 Other abnormalities of gait and mobility: Secondary | ICD-10-CM | POA: Insufficient documentation

## 2020-10-27 NOTE — Therapy (Signed)
[facility] [address] [place], [place], [address] Phone: [phone]   Fax:  [phone]  Physical Therapy Treatment/Discharge Summary  Patient Details  Name: Bryan [name] MRN: [id number] Date of Birth: 01-Oct-1975 Referring Provider (PT): [name]   PHYSICAL THERAPY DISCHARGE SUMMARY  Visits from Start of Care: 12  Current functional level related to goals / functional outcomes:  PT Long Term Goals - 10/27/20 1456       PT LONG TERM GOAL #1   Title Pt will performfinal progression of HEP with family supervision for improved strength, balance, transfers, and gait.  TARGET 11/11/2020 (4 week goals, delayed time frame due to Medicaid auth/scheduling)    Baseline Met per mom/pt report; not able to fully assess due to time constraints    Time 4    Period Weeks    Status Partially Met      PT LONG TERM GOAL #2   Title Pt will improve gait velocity to at least 1 ft/sec for improved gait efficiency and safety.    Baseline 09/27/20: 0.68 ft/sec with RW, decreased from last assessment of 0.96 ft/sec; 0.31 ft/sec 10/27/2020    Time 4    Period Weeks    Status Not Met      PT LONG TERM GOAL #3   Title Pt/family will verbalize understanding of fall prevention in home environment.    Baseline discussed at previous visit, 8/25    Time 4    Period Weeks    Status Achieved      PT LONG TERM GOAL #4   Title Pt will improve TUG score to less than or equal to 60 sec for decreased fall risk.    Baseline 09/27/20: 70.32 sec's, improved slightly from 72.91 secs, not to goal level; 10/27/20 not assessed due to time constraints    Time 4    Period Weeks    Status Not Met      PT LONG TERM GOAL #5   Title Pt will ambulate at least 200 ft, with supervision, with RW, for improved independence with gait.    Baseline 09/27/20: 115 with RW with supervision    Time 4    Period Weeks    Status Not Met            Pt has met 1 LTG.    Remaining deficits: Increased tone, ataxic gait, decreased strength, balance, abnormal posture.   Education / Equipment: Educated in [employer], fall prevention to Mom and pt.   Patient agrees to discharge. Patient goals were not met. Patient is being discharged due to lack of progress.  Mady Haagensen, PT 10/27/20 3:07 PM Phone: [phone] Fax: [phone]  Encounter Date: 10/27/2020   PT End of Session - 10/27/20 1452     Visit Number 12    Number of Visits 13    Date for PT Re-Evaluation 61/47/09   per recert 04/07/55   Authorization Type Medicaid    Authorization Time Period 12  visits from 08/02/20 through 10/03/20; 4 visits from 10/06/20-11/02/20    Authorization - Visit Number 3    Authorization - Number of Visits 4    PT Start Time 1350   attempted to take patient early, but he had prolonged stay in restroom   PT Stop Time 37   Mom requests to leave early due to her work schedule   PT Time Calculation (min) 25 min    Activity Tolerance Patient tolerated treatment well;Patient limited by fatigue  reports increased stiffness today   Behavior During Therapy Flat affect;WFL for tasks assessed/performed             Past Medical History:  Diagnosis Date   Controlled type 2 diabetes mellitus with hyperglycemia ([place])    Hypertension    Psychiatric problem    Seen at [facility] for unknown reason. Takes seroquel. History of hearing voices.  Not commandivng voices.    Schizophrenia ([place])    Tremors of nervous system     Past Surgical History:  Procedure Laterality Date   none     TRACHEOSTOMY TUBE PLACEMENT N/A 02/13/2020   Procedure: TRACHEOSTOMY;  Surgeon: Leta Baptist, MD;  Location: [facility] OR;  Service: ENT;  Laterality: N/A;    There were no vitals filed for this visit.   Subjective Assessment - 10/27/20 1344     Subjective Mom surprised that appointment is at 2 pm.  She was thinking it was at 1:15, and concerned they can't stay the full time because of her work schedule.  Pt in  restroom for a significant time prior to being able to start session (PT had a no-show and able to see him a little early); he c/o being more stiff in his legs today than usual.    Patient is accompained by: Family member   mom   Pertinent History Male with history of HTN, schizophrenia who was admitted 01/25/2020 with hypotension, lethargy and found to have newly diagnosed DM with DKA and AKI and anoxic encephalopathy; was admitted to [facility] and d/c home 2/11/ 2022; ED visit 05/2020    Limitations Standing;Walking    Patient Stated Goals Wants to work on balance, strength, gait    Currently in Pain? No/denies                               [facility] Adult PT Treatment/Exercise - 10/27/20 0001       Transfers   Transfers Sit to Stand;Stand to Sit    Sit to Stand 5: Supervision;With upper extremity assist;From bed;From chair/3-in-1    Stand to Sit 5: Supervision;With upper extremity assist;To bed;To chair/3-in-1      Ambulation/Gait   Ambulation/Gait Yes    Ambulation/Gait Assistance 5: Supervision;4: Min guard    Ambulation/Gait Assistance Details Pt takes several standing rest breaks due to fatigue.    Ambulation Distance (Feet) 60 Feet   x 2   Assistive device Rolling walker    Gait Pattern Step-through pattern;Decreased step length - right;Decreased step length - left;Ataxic;Decreased hip/knee flexion - left;Decreased hip/knee flexion - right;Decreased dorsiflexion - left;Wide base of support    Ambulation Surface Level;Indoor    Gait velocity 48.31 sec in 15 ft =0.31 ft/sec      Therapeutic Activites    Therapeutic Activities Other Therapeutic Activities    Other Therapeutic Activities Discussed visit limitations/progress towards goals (last scheduled visit is out of approved Medicaid timeframe, and that is the last available Medicaid visit).  Mom and pt are agreeable to discharging today, with PT recommending continued HEP at home, use of foot pedaler (mom thinking about  purchasing), continued supervised walking at home, and pt/family to follow up with Dr. Jeanie Cooks about tone management.  Discussed situations to warrant return to PT in several months.      Knee/Hip Exercises: Aerobic   Other Aerobic Foot pedaler bike at edge of mat, 3 minutes forward, then 1 minute back, then additional 30 sec forward.  Discussed  use of this type of activity at home to help manage pt's tone.                    PT Education - 10/27/20 1452     Education Details PT POC and progress, plans for d/c this visit    Person(s) Educated Patient;Parent(s)    Methods Explanation    Comprehension Verbalized understanding              PT Short Term Goals - 10/19/20 1519       PT SHORT TERM GOAL #1   Title Pt will perform HEP with family supervision for improved strength, balance, transfers, and gait.  TARGET 08/19/2020 (delayed one week due to [facility] scheduling)    Baseline no current HEP; HEP issued 08/25/2020    Status Achieved      PT SHORT TERM GOAL #2   Title Pt will perform sit<>stand 5 of 5 trials, mod independently and no LOB upon standing to demonstrate improved functional strength and transfer efficiency.    Baseline Supervision /mod I for sit<>stand    Status Partially Met      PT SHORT TERM GOAL #3   Title Berg Balance score to be assessed and LTG to be written to address balance/decreased fall risk.    Baseline 08/16/20: 30/56 scored today as baseline score with primary PT to update goals    Status Achieved      PT SHORT TERM GOAL #4   Title Pt will improve TUG score to less than or qual to 55 seconds for decreased fall risk.    Baseline 72.91 sec; 77 sec 08/25/20    Status Not Met      PT SHORT TERM GOAL #5   Title Pt/family will verbalize understanding of fall prevention in home environment.    Baseline 10/19/20: met in session today    Status Achieved               PT Long Term Goals - 10/27/20 1456       PT LONG TERM GOAL #1   Title  Pt will performfinal progression of HEP with family supervision for improved strength, balance, transfers, and gait.  TARGET 11/11/2020 (4 week goals, delayed time frame due to Medicaid auth/scheduling)    Baseline Met per mom/pt report; not able to fully assess due to time constraints    Time 4    Period Weeks    Status Partially Met      PT LONG TERM GOAL #2   Title Pt will improve gait velocity to at least 1 ft/sec for improved gait efficiency and safety.    Baseline 09/27/20: 0.68 ft/sec with RW, decreased from last assessment of 0.96 ft/sec; 0.31 ft/sec 10/27/2020    Time 4    Period Weeks    Status Not Met      PT LONG TERM GOAL #3   Title Pt/family will verbalize understanding of fall prevention in home environment.    Baseline discussed at previous visit, 8/25    Time 4    Period Weeks    Status Achieved      PT LONG TERM GOAL #4   Title Pt will improve TUG score to less than or equal to 60 sec for decreased fall risk.    Baseline 09/27/20: 70.32 sec's, improved slightly from 72.91 secs, not to goal level; 10/27/20 not assessed due to time constraints    Time 4    Period Weeks  Status Not Met      PT LONG TERM GOAL #5   Title Pt will ambulate at least 200 ft, with supervision, with RW, for improved independence with gait.    Baseline 09/27/20: 115 with RW with supervision    Time 4    Period Weeks    Status Not Met                   Plan - 10/27/20 1455     Clinical Impression Statement Discussed with pt and mom today that last scheduled visit next week is out the the approved Medicaid visit dates.  Ultimately, in looking at pt's progress (declining gait velocity due to continued stiffness bilat lower extremities), he would likely benefit from time away from therapy at this time to perform HEP at home and to discuss with MD medical management of tone. Pt and mom in agreement to d/c from PT at this time.    Personal Factors and Comorbidities Comorbidity 3+     Comorbidities HTN, DM, schizophrenia    Examination-Activity Limitations Locomotion Level;Transfers;Stairs;Stand    Examination-Participation Restrictions Community Activity;Other   sports   Stability/Clinical Decision Making Evolving/Moderate complexity    Rehab Potential Good    PT Frequency 1x / week   per recert 04/29/72   PT Duration 4 weeks    PT Treatment/Interventions ADLs/Self Care Home Management;Gait training;Stair training;Functional mobility training;Therapeutic activities;Therapeutic exercise;Balance training;Neuromuscular re-education;DME Instruction;Electrical Stimulation;Manual techniques;Orthotic Fit/Training;Patient/family education    PT Next Visit Plan D/C from PT this visit.    PT Home Exercise Plan Access Code [email or web address]; additional [employer] Access Code: [facility] 08/25/20    Consulted and Agree with Plan of Care Patient;Family member/caregiver    Family Member Consulted Mom             Patient will benefit from skilled therapeutic intervention in order to improve the following deficits and impairments:  Abnormal gait, Difficulty walking, Decreased balance, Impaired flexibility, Decreased mobility, Decreased strength, Impaired tone  Visit Diagnosis: Other abnormalities of gait and mobility  Unsteadiness on feet  Muscle weakness (generalized)     Problem List Patient Active Problem List   Diagnosis Date Noted   Postural dizziness with presyncope 06/08/2020   Hypercalcemia 06/08/2020   Hypertension    Chronic diastolic CHF (congestive heart failure) (HCC)    Abnormality of gait 05/19/2020   Visual disturbance    Acute blood loss anemia    Schizophrenia (HCC)    Elevated triglycerides with high cholesterol    Controlled type 2 diabetes mellitus with hyperglycemia (HCC)    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[place]) 03/15/2020   Pressure injury of skin 25/95/6387   Toxic metabolic encephalopathy    AKI (acute kidney injury) ([place])    Dehydration    Pancreatitis     Respiratory failure ([place])    DKA (diabetic ketoacidosis) ([place]) 01/25/2020   MDD (major depressive disorder), recurrent, severe, with psychosis ([place]) 03/12/2018   Paranoid schizophrenia ([place])    Undifferentiated schizophrenia ([place])    Delusional disorder ([place])    Illiterate 10/30/2011   Schizoaffective disorder, depressive type ([place]) 10/27/2011   Schizoaffective disorder ([place]) 08/10/2011   Observed seizure-like activity ([place]) 08/09/2011   PAIN IN JOINT, MULTIPLE SITES 02/16/2010   FATIGUE 02/16/2010   Shortness of breath 02/16/2010    Jaylyn Booher W. 10/27/2020, 3:05 PM Frazier Butt., PT   [facility] [address] [place], [place], [address] Phone: [phone]   Fax:  [phone]  Name: Bryan [name]  MRN: [id number] Date of Birth: 31-Dec-1975

## 2020-11-03 ENCOUNTER — Ambulatory Visit: Payer: Medicaid Other | Admitting: Physical Therapy

## 2021-04-26 ENCOUNTER — Other Ambulatory Visit: Payer: Self-pay | Admitting: Physical Medicine and Rehabilitation

## 2021-08-17 ENCOUNTER — Encounter: Payer: Self-pay | Admitting: Occupational Therapy

## 2021-08-17 ENCOUNTER — Ambulatory Visit: Payer: Self-pay | Admitting: Physical Therapy

## 2021-08-17 NOTE — Therapy (Deleted)
OUTPATIENT OCCUPATIONAL THERAPY NEURO EVALUATION  Patient Name: Bryan [name] MRN: [id number] DOB:[birth year], 46 y.o., male Today's Date: 08/17/2021  PCP: Fleet Contras, MD REFERRING PROVIDER: Fleet Contras, MD    Past Medical History:  Diagnosis Date   Controlled type 2 diabetes mellitus with hyperglycemia (HCC)    Hypertension    Psychiatric problem    Seen at [facility] for unknown reason. Takes seroquel. History of hearing voices.  Not commandivng voices.    Schizophrenia (HCC)    Tremors of nervous system    Past Surgical History:  Procedure Laterality Date   none     TRACHEOSTOMY TUBE PLACEMENT N/A 02/13/2020   Procedure: TRACHEOSTOMY;  Surgeon: Newman Pies, MD;  Location: [facility] OR;  Service: ENT;  Laterality: N/A;   Patient Active Problem List   Diagnosis Date Noted   Postural dizziness with presyncope 06/08/2020   Hypercalcemia 06/08/2020   Hypertension    Chronic diastolic CHF (congestive heart failure) (HCC)    Abnormality of gait 05/19/2020   Visual disturbance    Acute blood loss anemia    Schizophrenia (HCC)    Elevated triglycerides with high cholesterol    Controlled type 2 diabetes mellitus with hyperglycemia (HCC)    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   Pressure injury of skin 02/16/2020   Toxic metabolic encephalopathy    AKI (acute kidney injury) (HCC)    Dehydration    Pancreatitis    Respiratory failure (HCC)    DKA (diabetic ketoacidosis) (HCC) 01/25/2020   MDD (major depressive disorder), recurrent, severe, 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   Paranoid schizophrenia (HCC)    Undifferentiated schizophrenia (HCC)    Delusional disorder (HCC)    Illiterate 10/30/2011   Schizoaffective disorder, depressive type (HCC) 10/27/2011   Schizoaffective disorder (HCC) 08/10/2011   Observed seizure-like activity (HCC) 08/09/2011   PAIN IN JOINT, MULTIPLE SITES 02/16/2010   FATIGUE 02/16/2010   Shortness of breath 02/16/2010     Rationale for Evaluation  and Treatment Rehabilitation    ONSET DATE: 01/25/20  REFERRING DIAG: G82.20 (ICD-10-CM) - Paraplegia (HCC)  THERAPY DIAG:  No diagnosis found.  SUBJECTIVE:   SUBJECTIVE STATEMENT: Pt is here for OT evaluation. Pt is familiar to this clinic with episode from 07/12/20-10/18/20. Unclear as to why pt's episode was terminated.    Pt accompanied by: {accompnied:27141}  PERTINENT HISTORY: Went to ED 01/25/20 with DKA and AKI w/ newly diagnosed DM. Pt developed metabolic encephalopathy and anoxic brain damage as a result. PMH: schizoaffective disorder, HTN   PRECAUTIONS: Fall  WEIGHT BEARING RESTRICTIONS No  PAIN:  Are you having pain? {OPRCPAIN:27236}  FALLS: Has patient fallen in last 6 months? {fallsyesno:27318}  LIVING ENVIRONMENT: Lives with: {OPRC lives with:25569::"lives with their family"} Lives in: {Lives in:25570} Stairs: {opstairs:27293} Has following equipment at home: {Assistive devices:23999}  PLOF: Vocation/Vocational requirements: on disability, Leisure: sports, and Independent PLOF 12/2019 however has req'd assistance and 24 hour supervision since.  PATIENT GOALS ***  OBJECTIVE:    DIAGNOSTIC FINDINGS:  ***  HAND DOMINANCE: {MISC; OT HAND DOMINANCE:[phone]}  ADLs: Overall ADLs: *** Eating: *** Grooming: *** UB Dressing: *** LB Dressing: *** Toileting: *** Bathing: *** Tub Shower transfers: *** Equipment: {equipment:25573}   IADLs: Shopping: *** Light housekeeping: *** Meal Prep: *** Community mobility: *** Medication management: *** Financial management: *** Handwriting: {OTWRITTENEXPRESSION:25361}  MOBILITY STATUS: {OTMOBILITY:25360}  POSTURE COMMENTS:  {posture:25561}  Sitting balance: {sitting balance:25483}  ACTIVITY TOLERANCE: Activity tolerance: ***  FUNCTIONAL OUTCOME MEASURES: {OTFUNCTIONALMEASURES:27238}  UE ROM     {AROM/PROM:27142}  ROM Right 06/26/2021 Left 06/26/2021  Shoulder flexion    Shoulder abduction     Shoulder adduction    Shoulder extension    Shoulder internal rotation    Shoulder external rotation    Elbow flexion    Elbow extension    Wrist flexion    Wrist extension    Wrist ulnar deviation    Wrist radial deviation    Wrist pronation    Wrist supination    (Blank rows = not tested)   UE MMT:     MMT Right 06/26/2021 Left 06/26/2021  Shoulder flexion    Shoulder abduction    Shoulder adduction    Shoulder extension    Shoulder internal rotation    Shoulder external rotation    Middle trapezius    Lower trapezius    Elbow flexion    Elbow extension    Wrist flexion    Wrist extension    Wrist ulnar deviation    Wrist radial deviation    Wrist pronation    Wrist supination    (Blank rows = not tested)  HAND FUNCTION: {handfunction:27230}  06/2020 eval Right Hand Grip (lbs) 83.9 lbs      Left Hand Grip (lbs) 53.1 lbs     COORDINATION: {otcoordination:27237} 06/2020 eval  Right 9 Hole Peg Test 74.09 sec     Left 9 Hole Peg Test 2 min. 36 sec     SENSATION: {sensation:27233}  EDEMA: ***  MUSCLE TONE: {UETONE:25567}  COGNITION: Overall cognitive status: {cognition:24006}  VISION: Subjective report: *** Baseline vision: {OTBASELINEVISION:25363} Visual history: {OTVISUALHISTORY:25364}  VISION ASSESSMENT: {visionassessment:27231}  PERCEPTION: {Perception:25564}  PRAXIS: {Praxis:25565}  OBSERVATIONS: ***  TODAY'S TREATMENT:  *** None today - evaluation only.   PATIENT EDUCATION: Education details: Education on role and purpose of OT  Person educated: {Person educated:25204} Education method: {Education Method:25205} Education comprehension: {Education Comprehension:25206}   HOME EXERCISE PROGRAM: *** To Be Established    GOALS: Goals reviewed with patient? {yes/no:20286}  SHORT TERM GOALS: Target date: {follow up:25551}  *** Baseline: Goal status: INITIAL  2.  *** Baseline:  Goal status: INITIAL  3.  *** Baseline:   Goal status: INITIAL  4.  *** Baseline:  Goal status: INITIAL  5.  *** Baseline:  Goal status: INITIAL  6.  *** Baseline:  Goal status: INITIAL  LONG TERM GOALS: Target date: {follow up:25551}  *** Baseline:  Goal status: INITIAL  2.  *** Baseline:  Goal status: INITIAL  3.  *** Baseline:  Goal status: INITIAL  4.  *** Baseline:  Goal status: INITIAL  5.  *** Baseline:  Goal status: INITIAL  6.  *** Baseline:  Goal status: INITIAL  ASSESSMENT:  CLINICAL IMPRESSION: Patient is a 46 y.o. male who was seen today for occupational therapy evaluation for diagnosis of Paraplegia. Pt had Anoxic Brain Injury s/p toxic metabolic encephalopathy from DM w/ DKA and AKI in November 2021. Pt presents with deficits with ***. Skilled occupational therapy is recommended to target listed areas of deficit and increase independence with ADLs and IADLs and decrease caregiver burden.     PERFORMANCE DEFICITS in functional skills including {OT physical skills:25468}, cognitive skills including {OT cognitive skills:25469}, and psychosocial skills including {OT psychosocial skills:25470}.   IMPAIRMENTS are limiting patient from {OT performance deficits:25471}.   COMORBIDITIES {Comorbidities:25485} that affects occupational performance. Patient will benefit from skilled OT to address above impairments and improve overall function.  MODIFICATION OR ASSISTANCE TO COMPLETE EVALUATION: {OT modification:25474}  OT OCCUPATIONAL PROFILE AND  HISTORY: {OT PROFILE AND HISTORY:25484}  CLINICAL DECISION MAKING: {OT CDM:25475}  REHAB POTENTIAL: {rehabpotential:25112}  EVALUATION COMPLEXITY: {Evaluation complexity:25115}    PLAN: OT FREQUENCY: {rehab frequency:25116}  OT DURATION: {rehab duration:25117}  PLANNED INTERVENTIONS: {OT Interventions:25467}  RECOMMENDED OTHER SERVICES: ***  CONSULTED AND AGREED WITH PLAN OF CARE: {WUJ:81191}  PLAN FOR NEXT SESSION: ***   Junious Dresser, OT 08/17/2021, 10:41 AM

## 2021-08-31 ENCOUNTER — Ambulatory Visit: Payer: Medicaid Other | Attending: Internal Medicine

## 2021-08-31 ENCOUNTER — Ambulatory Visit: Payer: Medicaid Other | Admitting: Occupational Therapy

## 2021-08-31 DIAGNOSIS — R41840 Attention and concentration deficit: Secondary | ICD-10-CM

## 2021-08-31 DIAGNOSIS — M25612 Stiffness of left shoulder, not elsewhere classified: Secondary | ICD-10-CM | POA: Insufficient documentation

## 2021-08-31 DIAGNOSIS — R2681 Unsteadiness on feet: Secondary | ICD-10-CM | POA: Insufficient documentation

## 2021-08-31 DIAGNOSIS — R278 Other lack of coordination: Secondary | ICD-10-CM

## 2021-08-31 DIAGNOSIS — M6281 Muscle weakness (generalized): Secondary | ICD-10-CM | POA: Diagnosis present

## 2021-08-31 NOTE — Therapy (Signed)
OUTPATIENT OCCUPATIONAL THERAPY NEURO EVALUATION  Patient Name: Bryan [name] MRN: [id number] DOB:1975/06/29, 46 y.o., male Today's Date: 09/01/2021  PCP: Fleet Contras, MD REFERRING PROVIDER: Fleet Contras, MD   OT End of Session - 08/31/21 1514     Visit Number 1    Number of Visits 9    Date for OT Re-Evaluation 11/10/21    Authorization Type Medicaid  Access    Authorization Time Period Need Auth - Req'd - 27 VL    OT Start Time 1530    OT Stop Time 1615    OT Time Calculation (min) 45 min    Activity Tolerance Patient tolerated treatment well    Behavior During Therapy WFL for tasks assessed/performed             Past Medical History:  Diagnosis Date   Controlled type 2 diabetes mellitus with hyperglycemia (HCC)    Hypertension    Psychiatric problem    Seen at [facility] for unknown reason. Takes seroquel. History of hearing voices.  Not commandivng voices.    Schizophrenia (HCC)    Tremors of nervous system    Past Surgical History:  Procedure Laterality Date   none     TRACHEOSTOMY TUBE PLACEMENT N/A 02/13/2020   Procedure: TRACHEOSTOMY;  Surgeon: Newman Pies, MD;  Location: [facility] OR;  Service: ENT;  Laterality: N/A;   Patient Active Problem List   Diagnosis Date Noted   Postural dizziness with presyncope 06/08/2020   Hypercalcemia 06/08/2020   Hypertension    Chronic diastolic CHF (congestive heart failure) (HCC)    Abnormality of gait 05/19/2020   Visual disturbance    Acute blood loss anemia    Schizophrenia (HCC)    Elevated triglycerides with high cholesterol    Controlled type 2 diabetes mellitus with hyperglycemia (HCC)    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   Pressure injury of skin 02/16/2020   Toxic metabolic encephalopathy    AKI (acute kidney injury) (HCC)    Dehydration    Pancreatitis    Respiratory failure (HCC)    DKA (diabetic ketoacidosis) (HCC) 01/25/2020   MDD (major depressive disorder), recurrent, severe, 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/12/2018   Paranoid schizophrenia (HCC)    Undifferentiated schizophrenia (HCC)    Delusional disorder (HCC)    Illiterate 10/30/2011   Schizoaffective disorder, depressive type (HCC) 10/27/2011   Schizoaffective disorder (HCC) 08/10/2011   Observed seizure-like activity (HCC) 08/09/2011   PAIN IN JOINT, MULTIPLE SITES 02/16/2010   FATIGUE 02/16/2010   Shortness of breath 02/16/2010     Rationale for Evaluation and Treatment Rehabilitation    ONSET DATE: 01/25/20, referral 08/09/21  REFERRING DIAG: G82.20 (ICD-10-CM) - Paraplegia (HCC)  THERAPY DIAG:  Unsteadiness on feet  Muscle weakness (generalized)  Stiffness of left shoulder, not elsewhere classified  Other lack of coordination  Attention and concentration deficit  SUBJECTIVE:   SUBJECTIVE STATEMENT: Pt is here for OT evaluation. Pt is familiar to this clinic with episode from 07/12/20-10/18/20. Pt's episode was never terminated and patient stopped coming to appointments. Unclear as to why this occurred.   Pt accompanied by: family member  PERTINENT HISTORY: Went to ED 01/25/20 with DKA and AKI w/ newly diagnosed DM. Pt developed metabolic encephalopathy and anoxic brain damage as a result. PMH: schizoaffective disorder, HTN   PRECAUTIONS: Fall  WEIGHT BEARING RESTRICTIONS No  PAIN:  Are you having pain? No  FALLS: Has patient fallen in last 6 months? No  LIVING ENVIRONMENT: Lives with: lives with their family (  mom, brothers) Lives in: House/apartment Stairs:  one level - tub/shower combo Has following equipment at home: Dan Humphreys - 2 wheeled and Shower bench  PLOF: Vocation/Vocational requirements: on disability, Leisure: sports, and Independent PLOF 12/2019 however has req'd assistance and 24 hour supervision since.  PATIENT GOALS "legs not feeling as strong"   OBJECTIVE:   HAND DOMINANCE: Right  ADLs: Overall ADLs: per pt report and mom - needing set up assistance only Eating: needs  assistance for cutting up food Grooming: needs assistance for shaving UB Dressing: set up LB Dressing: set up Toileting: mod I - with BSC but not thoroughly and sometimes needs assistance with changing sheets d/t making a mess Bathing: set up with wash bin Tub Shower transfers: does not get in right now d/t difficulty stepping over tub edge Equipment: Shower seat with back   IADLs:  Shopping: not completing  Light housekeeping: Dependent Meal Prep: Dependent Community mobility: Relies and family and friends Medication management: Dependent Financial management: Dependent Handwriting: 75% legible  MOBILITY STATUS: Needs Assist: req'd to get w/c today for evaluation. Usually with 2wRW     Pt not feeling up to walking today in lobby d/t dizziness and feeling tired per report.  FUNCTIONAL OUTCOME MEASURES: None completed  UE ROM     Active ROM Right 06/26/2021 Left 06/26/2021  Shoulder flexion [facility] 110  Shoulder abduction    Shoulder adduction    Shoulder extension    Shoulder internal rotation    Shoulder external rotation    Elbow flexion    Elbow extension    Wrist flexion    Wrist extension    Wrist ulnar deviation    Wrist radial deviation    Wrist pronation    Wrist supination    (Blank rows = not tested)   UE MMT:     MMT Right 06/26/2021 Left 06/26/2021  Shoulder flexion [facility]  Shoulder abduction   Shoulder adduction   Shoulder extension   Shoulder internal rotation   Shoulder external rotation   Middle trapezius   Lower trapezius   Elbow flexion   Elbow extension   Wrist flexion   Wrist extension   Wrist ulnar deviation   Wrist radial deviation   Wrist pronation   Wrist supination   (Blank rows = not tested)  HAND FUNCTION: Grip strength: Right: 135.5 lbs; Left: 92.1 lbs  Previous Scores: May 2022 Evaluation Rt 83.9 lbs Lt 53.1 lbs  COORDINATION: 9 Hole Peg test: Right: 63.71 sec; Left: 58.58 sec  Previous Scores: May 2022 Evaluation Rt  74.09s Lt 51m36s   SENSATION: WFL  EDEMA: none  MUSCLE TONE: RUE: Hypertonic and LUE: Hypertonic  COGNITION: Overall cognitive status: Impaired, slower processing, decreased memory, attention, awareness  VISION: Subjective report: Pt reports "little by little it's getting better" Baseline vision: No visual deficits  PERCEPTION: Impaired: continue to assess  PRAXIS: Impaired: Ideation, Initiation, Ideomotor, Motor planning, and Perseveration   TODAY'S TREATMENT:  09/01/21  None today - evaluation only.   PATIENT EDUCATION: Education details: Education on role and purpose of OT  Person educated: Patient and Caregiver Education method: Explanation Education comprehension: verbalized understanding and needs further education   HOME EXERCISE PROGRAM: 09/01/21  To Be Established    GOALS: Goals reviewed with patient? No  SHORT TERM GOALS: Target date: 09/29/2021   Status:  1 Pt will be independent with HEP targeting BUE shoulder range of motion and coordination.  Baseline: review HEP from last episode Initial  2 Pt to write  1-2 sentences wit increased legibility and appropriate size.  Baseline: large print Initial  3 Pt will perform BADLs with mod I Baseline: supervision/set up Initial  4 Pt will increase fine motor coordination in BUE by improving 9 hole peg test by at least 6 seconds   Baseline: Right: 63.71 sec; Left: 58.58 sec Initial     LONG TERM GOALS: Target date: 11/10/2021    Status:  1 Pt will be independent with updated HEP. Baseline: not issued at eval Initial  2 Pt will assist with at least 1 home management task per day in order to increase independence and participation in decrease caregiver burden. Baseline: not assisting Initial  3 Pt to perform overhead reaching LUE to retrieve light weight object from high shelf. Baseline: 100* Initial  4 Pt will attend to visual and cognitive task for at least 7 minutes with no cues for redirection. Baseline:  decreased attention Initial     ASSESSMENT:  CLINICAL IMPRESSION: Patient is a 46 y.o. male who was seen today for occupational therapy evaluation for diagnosis of Paraplegia. Pt had Anoxic Brain Injury s/p toxic metabolic encephalopathy from DM w/ DKA and AKI in November 2021. Pt presents with deficits with LUE stiffness, decreased coordination and unsteadiness on feet. Also with decreased cognitive skills of attention, memory and overall awareness. Skilled occupational therapy is recommended to target listed areas of deficit and increase independence with ADLs and IADLs and decrease caregiver burden.     PERFORMANCE DEFICITS in functional skills including ADLs, IADLs, coordination, dexterity, proprioception, tone, ROM, strength, FMC, GMC, balance, decreased knowledge of precautions, decreased knowledge of use of DME, and UE functional use, cognitive skills including attention, energy/drive, memory, perception, problem solving, safety awareness, sequencing, and understand, and psychosocial skills including environmental adaptation.   IMPAIRMENTS are limiting patient from ADLs, IADLs, and leisure.   COMORBIDITIES may have co-morbidities  that affects occupational performance. Patient will benefit from skilled OT to address above impairments and improve overall function.  MODIFICATION OR ASSISTANCE TO COMPLETE EVALUATION: Min-Moderate modification of tasks or assist with assess necessary to complete an evaluation.  OT OCCUPATIONAL PROFILE AND HISTORY: Detailed assessment: Review of records and additional review of physical, cognitive, psychosocial history related to current functional performance.  CLINICAL DECISION MAKING: Moderate - several treatment options, min-mod task modification necessary  REHAB POTENTIAL: Fair d/t previous time in therapy  EVALUATION COMPLEXITY: Moderate    PLAN: OT FREQUENCY: 1x/week  OT DURATION: 10 weeks 8 visits over 10 weeks d/t any scheduling conflicts    PLANNED INTERVENTIONS: self care/ADL training, therapeutic exercise, therapeutic activity, neuromuscular re-education, functional mobility training, aquatic therapy, ultrasound, patient/family education, cognitive remediation/compensation, energy conservation, and DME and/or AE instructions  RECOMMENDED OTHER SERVICES: missed PT evaluation today.  CONSULTED AND AGREED WITH PLAN OF CARE: Patient and family member/caregiver  PLAN FOR NEXT SESSION: pegboard, Coordination HEP review from previous episode, supine ROM for shoulder   Junious Dresser, OT 09/01/2021, 8:32 AM

## 2021-09-01 ENCOUNTER — Encounter: Payer: Self-pay | Admitting: Occupational Therapy

## 2021-09-26 ENCOUNTER — Ambulatory Visit: Payer: Medicaid Other

## 2021-09-26 ENCOUNTER — Ambulatory Visit: Payer: Medicaid Other | Admitting: Occupational Therapy

## 2021-09-28 ENCOUNTER — Telehealth: Payer: Self-pay

## 2021-09-28 NOTE — Telephone Encounter (Signed)
Patient Name: Bryan [name] MRN: [id number] DOB:04-15-1975, 46 y.o., male Today's Date: 09/28/2021  Pt has cxl/no show for 3 PT evaluations consecutively on 6/22, 7/6 and 8/1. Pt also has no showed for OT eval on 6/22 and has not returned for OT follow up since OT evaluation in early July. Due to poor compliance with therapy, patient was called and left VM that remaining therapy appts will be cancelled and he will need to obtain new order from MD to continue with PT and OT.   Ileana Ladd, PT 09/28/2021, 3:40 PM

## 2021-10-03 ENCOUNTER — Ambulatory Visit: Payer: Medicaid Other | Attending: Internal Medicine | Admitting: Occupational Therapy

## 2021-10-03 ENCOUNTER — Ambulatory Visit: Payer: Medicaid Other

## 2021-10-03 DIAGNOSIS — M6281 Muscle weakness (generalized): Secondary | ICD-10-CM | POA: Diagnosis present

## 2021-10-03 DIAGNOSIS — R2689 Other abnormalities of gait and mobility: Secondary | ICD-10-CM | POA: Diagnosis present

## 2021-10-03 DIAGNOSIS — R2681 Unsteadiness on feet: Secondary | ICD-10-CM | POA: Insufficient documentation

## 2021-10-03 DIAGNOSIS — R278 Other lack of coordination: Secondary | ICD-10-CM | POA: Insufficient documentation

## 2021-10-03 DIAGNOSIS — M25612 Stiffness of left shoulder, not elsewhere classified: Secondary | ICD-10-CM | POA: Insufficient documentation

## 2021-10-03 DIAGNOSIS — R41840 Attention and concentration deficit: Secondary | ICD-10-CM | POA: Diagnosis present

## 2021-10-03 NOTE — Therapy (Signed)
OUTPATIENT OCCUPATIONAL THERAPY NEURO TREATMENT  Patient Name: Bryan [name] MRN: [id number] DOB:June 21, 1975, 46 y.o., male Today's Date: 10/03/2021  PCP: Nolene Ebbs, MD REFERRING PROVIDER: Nolene Ebbs, MD   OT End of Session - 10/03/21 1349     Visit Number 2    Number of Visits 9    Date for OT Re-Evaluation 11/10/21    Authorization Type Medicaid [place] Access    Authorization Time Period 3 visits approved from 09/26/21 - 10/16/21    Authorization - Visit Number 1    Authorization - Number of Visits 3    OT Start Time 1017    OT Stop Time 1400    OT Time Calculation (min) 45 min    Activity Tolerance Patient tolerated treatment well    Behavior During Therapy WFL for tasks assessed/performed             Past Medical History:  Diagnosis Date   Controlled type 2 diabetes mellitus with hyperglycemia ([place])    Hypertension    Psychiatric problem    Seen at [facility] for unknown reason. Takes seroquel. History of hearing voices.  Not commandivng voices.    Schizophrenia ([place])    Tremors of nervous system    Past Surgical History:  Procedure Laterality Date   none     TRACHEOSTOMY TUBE PLACEMENT N/A 02/13/2020   Procedure: TRACHEOSTOMY;  Surgeon: Leta Baptist, MD;  Location: [place] OR;  Service: ENT;  Laterality: N/A;   Patient Active Problem List   Diagnosis Date Noted   Postural dizziness with presyncope 06/08/2020   Hypercalcemia 06/08/2020   Hypertension    Chronic diastolic CHF (congestive heart failure) ([place])    Abnormality of gait 05/19/2020   Visual disturbance    Acute blood loss anemia    Schizophrenia (HCC)    Elevated triglycerides with high cholesterol    Controlled type 2 diabetes mellitus with hyperglycemia (HCC)    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[place]) 03/15/2020   Pressure injury of skin 51/03/5850   Toxic metabolic encephalopathy    AKI (acute kidney injury) ([place])    Dehydration    Pancreatitis    Respiratory failure ([place])    DKA (diabetic ketoacidosis)  ([place]) 01/25/2020   MDD (major depressive disorder), recurrent, severe, with psychosis ([place]) 03/12/2018   Paranoid schizophrenia ([place])    Undifferentiated schizophrenia ([place])    Delusional disorder ([place])    Illiterate 10/30/2011   Schizoaffective disorder, depressive type ([place]) 10/27/2011   Schizoaffective disorder ([place]) 08/10/2011   Observed seizure-like activity ([place]) 08/09/2011   PAIN IN JOINT, MULTIPLE SITES 02/16/2010   FATIGUE 02/16/2010   Shortness of breath 02/16/2010     Rationale for Evaluation and Treatment Rehabilitation    ONSET DATE: 01/25/20, referral 08/09/21  REFERRING DIAG: G82.20 (ICD-10-CM) - Paraplegia ([place])  THERAPY DIAG:  Other lack of coordination  Stiffness of left shoulder, not elsewhere classified  Attention and concentration deficit  Unsteadiness on feet  SUBJECTIVE:   SUBJECTIVE STATEMENT: Denies pain Pt accompanied by: family member  PERTINENT HISTORY: Went to ED 01/25/20 with DKA and AKI w/ newly diagnosed DM. Pt developed metabolic encephalopathy and anoxic brain damage as a result. PMH: schizoaffective disorder, HTN   PRECAUTIONS: Fall  WEIGHT BEARING RESTRICTIONS No  PAIN:  Are you having pain? No  FALLS: Has patient fallen in last 6 months? No  PATIENT GOALS "legs not feeling as strong"   OBJECTIVE:   HAND DOMINANCE: Right  TODAY'S TREATMENT:  10/03/21  Pt has not returned in over a  month since O.T. eval and has no showed/cx previously scheduled P.T. evaluation. [name] reports her schedule is limited and wants to focus on balance and leg strengthening. O.T. felt it would be better given their limited schedule and limited O.T. availability to just focus on P.T. at this time - pt/mother agreeable.   Pt issued HEP for bilateral hand coordination and Lt shoulder ROM - see pt instructions for details. Assessed goals for d/c   PATIENT EDUCATION: Education details: HEP  Person educated: Patient and Parent Education method:  Consulting civil engineer, Demonstration, Verbal cues, and Handouts Education comprehension: verbalized understanding, returned demonstration, verbal cues required, tactile cues required, and reinforced that mother would still need to provide cueing to stop once reps done or to switch to other hand   HOME EXERCISE PROGRAM: 10/03/21 HEP for bilateral hand coordination and shoulder ROM    GOALS: Goals reviewed with patient? No  SHORT TERM GOALS: Target date: 10/31/2021   Status:  1 Pt will be independent with HEP targeting BUE shoulder range of motion and coordination.  Baseline: review HEP from last episode MET  2 Pt to write 1-2 sentences wit increased legibility and appropriate size.  Baseline: large print PARTIALLY MET - still writes large  3 Pt will perform BADLs with mod I Baseline: supervision/set up deferred  4 Pt will increase fine motor coordination in BUE by improving 9 hole peg test by at least 6 seconds   Baseline: Right: 63.71 sec; Left: 58.58 sec deferred     LONG TERM GOALS: Target date: 12/12/2021    Status:  1 Pt will be independent with updated HEP. Baseline: not issued at eval deferred  2 Pt will assist with at least 1 home management task per day in order to increase independence and participation in decrease caregiver burden. Baseline: not assisting deferred  3 Pt to perform overhead reaching LUE to retrieve light weight object from high shelf. Baseline: 100* MET  4 Pt will attend to visual and cognitive task for at least 7 minutes with no cues for redirection. Baseline: decreased attention deferred     ASSESSMENT:  CLINICAL IMPRESSION: Pt returns today for O.T. after a month since initial evaluation. Pt w/ previous inconsistent attendance at last episode of care in 2022. Suggested to pt/mother that due to scheduling conflicts and her limited availability to bring patient, that we d/c O.T. for now and focus more on P.T.   PERFORMANCE DEFICITS in functional skills  including ADLs, IADLs, coordination, dexterity, proprioception, tone, ROM, strength, FMC, GMC, balance, decreased knowledge of precautions, decreased knowledge of use of DME, and UE functional use, cognitive skills including attention, energy/drive, memory, perception, problem solving, safety awareness, sequencing, and understand, and psychosocial skills including environmental adaptation.   IMPAIRMENTS are limiting patient from ADLs, IADLs, and leisure.   COMORBIDITIES may have co-morbidities  that affects occupational performance. Patient will benefit from skilled OT to address above impairments and improve overall function.  MODIFICATION OR ASSISTANCE TO COMPLETE EVALUATION: Min-Moderate modification of tasks or assist with assess necessary to complete an evaluation.  OT OCCUPATIONAL PROFILE AND HISTORY: Detailed assessment: Review of records and additional review of physical, cognitive, psychosocial history related to current functional performance.  CLINICAL DECISION MAKING: Moderate - several treatment options, min-mod task modification necessary  REHAB POTENTIAL: Fair d/t previous time in therapy  EVALUATION COMPLEXITY: Moderate    PLAN: OT FREQUENCY: 1x/week  OT DURATION: 10 weeks 8 visits over 10 weeks d/t any scheduling conflicts   PLANNED INTERVENTIONS: self care/ADL  training, therapeutic exercise, therapeutic activity, neuromuscular re-education, functional mobility training, aquatic therapy, ultrasound, patient/family education, cognitive remediation/compensation, energy conservation, and DME and/or AE instructions  RECOMMENDED OTHER SERVICES: missed PT evaluation today.  CONSULTED AND AGREED WITH PLAN OF CARE: Patient and family member/caregiver  PLAN :  D/C O.T. at this time. Pt may return once he can consistently get here to maximize rehab.   Hans Eden, OT 10/03/2021, 2:21 PM

## 2021-10-03 NOTE — Patient Instructions (Addendum)
  Coordination Activities  Perform the following activities for 15 minutes 2 times per day with both hand(s).  Flip cards over 1 at a time with Rt hand, then Lt hand. Pick up coins, buttons, checkers, dried beans/pasta, etc of different sizes and place in container. Pick up pennies and place in container or coin bank. Practice writing with RT hand only   SELF ASSISTED WITH OBJECT: Shoulder Flexion - Sitting    Hold cane with both hands. Raise arms up SLOWLY. _10__ reps per set, _2__ sets per day    ROM: Abduction - Wand    Holding wand with left hand palm up, push wand directly out to side, leading with other hand palm down, until stretch is felt. Hold __5__ seconds. Repeat _10___ times per set.  Do _2___ sessions per day.

## 2021-10-03 NOTE — Therapy (Unsigned)
OUTPATIENT PHYSICAL THERAPY NEURO EVALUATION   Patient Name: Bryan [name] MRN: [id number] DOB:[birth year], 46 y.o., male Today's Date: 10/04/2021   PCP: Fleet Contras, MD REFERRING PROVIDER: Fleet Contras, MD   PT End of Session - 10/04/21 [phone]     Visit Number 1    Number of Visits 5    Date for PT Re-Evaluation 11/14/21   to allow for scheduling conflicts   Authorization Type mcd    PT Start Time 1403    PT Stop Time 1445    PT Time Calculation (min) 42 min    Activity Tolerance Patient limited by fatigue    Behavior During Therapy [facility] for tasks assessed/performed             Past Medical History:  Diagnosis Date   Controlled type 2 diabetes mellitus with hyperglycemia (HCC)    Hypertension    Psychiatric problem    Seen at [facility] for unknown reason. Takes seroquel. History of hearing voices.  Not commandivng voices.    Schizophrenia (HCC)    Tremors of nervous system    Past Surgical History:  Procedure Laterality Date   none     TRACHEOSTOMY TUBE PLACEMENT N/A 02/13/2020   Procedure: TRACHEOSTOMY;  Surgeon: Newman Pies, MD;  Location: [facility] OR;  Service: ENT;  Laterality: N/A;   Patient Active Problem List   Diagnosis Date Noted   Postural dizziness with presyncope 06/08/2020   Hypercalcemia 06/08/2020   Hypertension    Chronic diastolic CHF (congestive heart failure) (HCC)    Abnormality of gait 05/19/2020   Visual disturbance    Acute blood loss anemia    Schizophrenia (HCC)    Elevated triglycerides with high cholesterol    Controlled type 2 diabetes mellitus with hyperglycemia (HCC)    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   Pressure injury of skin 02/16/2020   Toxic metabolic encephalopathy    AKI (acute kidney injury) (HCC)    Dehydration    Pancreatitis    Respiratory failure (HCC)    DKA (diabetic ketoacidosis) (HCC) 01/25/2020   MDD (major depressive disorder), recurrent, severe, 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   Paranoid schizophrenia (HCC)     Undifferentiated schizophrenia (HCC)    Delusional disorder (HCC)    Illiterate 10/30/2011   Schizoaffective disorder, depressive type (HCC) 10/27/2011   Schizoaffective disorder (HCC) 08/10/2011   Observed seizure-like activity (HCC) 08/09/2011   PAIN IN JOINT, MULTIPLE SITES 02/16/2010   FATIGUE 02/16/2010   Shortness of breath 02/16/2010    ONSET DATE: 08/09/21 referral   REFERRING DIAG: G82.20 (ICD-10-CM) - Paraplegia (HCC)  THERAPY DIAG:  Other lack of coordination  Other abnormalities of gait and mobility  Muscle weakness (generalized)  Unsteadiness on feet  Rationale for Evaluation and Treatment Rehabilitation  SUBJECTIVE:  SUBJECTIVE STATEMENT: Patient reports doing well. Patient reports that in the past 6 months he's been "resting" and using his pedal bike ~ every 3 days.    Pt accompanied by: family member  PERTINENT HISTORY: Went to ED 01/25/20 with DKA and AKI w/ newly diagnosed DM. Pt developed metabolic encephalopathy and anoxic brain damage as a result. PMH: schizoaffective disorder, HTN   PAIN:  Are you having pain? No  PRECAUTIONS: Fall  WEIGHT BEARING RESTRICTIONS No  FALLS: Has patient fallen in last 6 months? No  LIVING ENVIRONMENT: Lives with: lives with their family Lives in: House/apartment Stairs: Yes: External: 1 steps; can reach both Has following equipment at home: Environmental consultant - 2 wheeled, Shower bench, and Grab bars  PLOF: Requires assistive device for independence, Needs assistance with ADLs, and Needs assistance with homemaking  PATIENT GOALS "to get back walking"   OBJECTIVE:   COGNITION: Overall cognitive status: History of cognitive impairments - at baseline   SENSATION: WFL  COORDINATION: WFL, but intentional B LE heel-shin and figure 8    POSTURE: No Significant postural limitations  LOWER EXTREMITY MMT:    MMT Right Eval Left Eval  Hip flexion 5 4  Hip extension    Hip abduction 5 5  Hip adduction 5 5  Hip internal rotation    Hip external rotation    Knee flexion 5 4  Knee extension 5 5  Ankle dorsiflexion 5 5  Ankle plantarflexion    Ankle inversion    Ankle eversion    (Blank rows = not tested)  BED MOBILITY:  Patient reports no difficulty   TRANSFERS: Assistive device utilized: Environmental consultant - 2 wheeled  Sit to stand: Modified independence Stand to sit: Modified independence Chair to chair: SBA   GAIT: Gait pattern: step to pattern, decreased step length- Right, decreased stride length, decreased hip/knee flexion- Right, decreased hip/knee flexion- Left, Right foot flat, Left foot flat, trunk flexed, poor foot clearance- Right, and poor foot clearance- Left Distance walked: clinic Assistive device utilized: Walker - 2 wheeled Level of assistance: SBA Comments: Patient ambulating at a very slow speed, easily fatigued after distances >10-5ft  FUNCTIONAL TESTs:   [facility] PT Assessment - 10/03/21 0001       Standardized Balance Assessment   Standardized Balance Assessment 10 meter walk test    Five times sit to stand comments  26.03s   B UE support     Timed Up and Go Test   Normal TUG (seconds) 102             TODAY'S TREATMENT:  N/A eval    PATIENT EDUCATION: Education details: PT POC, OM results, rehab potential  Person educated: Patient and Parent Education method: Explanation Education comprehension: verbalized understanding and needs further education   HOME EXERCISE PROGRAM: To be provided    GOALS: Goals reviewed with patient? Yes  SHORT TERM GOALS: = LTG based on POC length   LONG TERM GOALS: Target date: 11/14/2021  Pt will be independent with final HEP for improved endurance and strength   Baseline: to be provided Goal status: INITIAL  2.  Pt will improve 5x STS  to </= 20 sec to demo improved functional LE strength and balance   Baseline: 26.03s with B UE Goal status: INITIAL  3.  Pt will improve TUG to </= 75 secs to demonstrated reduced fall risk  Baseline: 102s with RW Goal status: INITIAL  4.  Patients gait speed to be assessed and LTG to be updated  as appropriate Baseline: to be assessed Goal status: INITIAL    ASSESSMENT:  CLINICAL IMPRESSION: Patient is a 46 y.o. male who was seen today for physical therapy evaluation and treatment for impaired balance, gait mechanics and decreased functional strength due to multiple medical issues and a prolonged hospital stay. Patient reports participating in minimal activity at home (walking ~90ft at a time a few times a day). Patient agreeable to committing to increasing his activity and exercise at home in order to see the progress he wants. Five times Sit to Stand Test (FTSS) TIME: 26.03 sec Cut off scores indicative of increased fall risk: >12 sec CVA, >16 sec PD, >13 sec vestibular (ANPTA Core Set of Outcome Measures for Adults with Neurologic Conditions, 2018). Patient completed the Timed Up and Go test (TUG) in 102 seconds.  Geriatrics: need for further assessment of fall risk: ? 12 sec; Recurrent falls: > 15 sec; Vestibular Disorders fall risk: > 15 sec; Parkinson's Disease fall risk: > 16 sec ([email or web address], 2023). Patient would benefit from skilled PT services in order to address the above mentioned deficits.   OBJECTIVE IMPAIRMENTS Abnormal gait, cardiopulmonary status limiting activity, decreased activity tolerance, decreased balance, decreased cognition, decreased endurance, decreased knowledge of condition, decreased knowledge of use of DME, decreased mobility, difficulty walking, and decreased strength.   ACTIVITY LIMITATIONS carrying, lifting, bending, standing, squatting, stairs, transfers, bathing, dressing, locomotion level, and caring for others  PARTICIPATION LIMITATIONS: meal prep,  cleaning, laundry, interpersonal relationship, driving, shopping, community activity, and occupation  PERSONAL FACTORS Behavior pattern, Education, Fitness, Past/current experiences, Time since onset of injury/illness/exacerbation, Transportation, and 3+ comorbidities: metabolic encephalopathy and anoxic brain injury, HTN, schizoaffective disorder  are also affecting patient's functional outcome.   REHAB POTENTIAL: Fair time since onset  CLINICAL DECISION MAKING: Stable/uncomplicated  EVALUATION COMPLEXITY: Low  PLAN: PT FREQUENCY: 1x/week  PT DURATION: 4 weeks  PLANNED INTERVENTIONS: Therapeutic exercises, Therapeutic activity, Neuromuscular re-education, Balance training, Gait training, Patient/Family education, Self Care, Joint mobilization, Stair training, Vestibular training, Visual/preceptual remediation/compensation, Orthotic/Fit training, DME instructions, Aquatic Therapy, Cognitive remediation, Wheelchair mobility training, Manual therapy, and Re-evaluation  PLAN FOR NEXT SESSION: assess gait speed, provide HEP, hows activity levels at home?   Westley Foots, PT Westley Foots, PT, DPT, CBIS  10/04/2021, 7:28 AM    Managed medicaid CPT codes: 29798 - PT Re-evaluation, [phone]- Therapeutic Exercise, [phone]- Neuro Re-education, [phone] - Gait Training, [phone] - Manual Therapy, 97530 - Therapeutic Activities, 97535 - Self Care, [phone] - Electrical stimulation (unattended), [phone] - Electrical stimulation (Manual), [id number] - Orthotic Fit, and [id number] - Aquatic therapy

## 2021-10-10 ENCOUNTER — Encounter: Payer: Medicaid Other | Admitting: Occupational Therapy

## 2021-10-10 ENCOUNTER — Ambulatory Visit: Payer: Medicaid Other

## 2021-10-12 ENCOUNTER — Encounter: Payer: Self-pay | Admitting: Physical Therapy

## 2021-10-12 ENCOUNTER — Ambulatory Visit: Payer: Medicaid Other | Admitting: Physical Therapy

## 2021-10-12 DIAGNOSIS — R2681 Unsteadiness on feet: Secondary | ICD-10-CM

## 2021-10-12 DIAGNOSIS — R278 Other lack of coordination: Secondary | ICD-10-CM | POA: Diagnosis not present

## 2021-10-12 DIAGNOSIS — M6281 Muscle weakness (generalized): Secondary | ICD-10-CM

## 2021-10-12 DIAGNOSIS — R2689 Other abnormalities of gait and mobility: Secondary | ICD-10-CM

## 2021-10-12 NOTE — Therapy (Signed)
OUTPATIENT PHYSICAL THERAPY NEURO TREATMENT    Patient Name: Bryan [name] MRN: [id number] DOB:09-Aug-1975, 46 y.o., male Today's Date: 10/12/2021   PCP: Nolene Ebbs, MD REFERRING PROVIDER: Nolene Ebbs, MD   PT End of Session - 10/12/21 1406     Visit Number 2    Number of Visits 5    Date for PT Re-Evaluation 11/14/21   to allow for scheduling conflicts   Authorization Type mcd    PT Start Time 1400    PT Stop Time 1445    PT Time Calculation (min) 45 min    Equipment Utilized During Treatment Gait belt    Activity Tolerance Patient limited by fatigue;Patient tolerated treatment well    Behavior During Therapy WFL for tasks assessed/performed              Past Medical History:  Diagnosis Date   Controlled type 2 diabetes mellitus with hyperglycemia ([place])    Hypertension    Psychiatric problem    Seen at [facility] for unknown reason. Takes seroquel. History of hearing voices.  Not commandivng voices.    Schizophrenia ([place])    Tremors of nervous system    Past Surgical History:  Procedure Laterality Date   none     TRACHEOSTOMY TUBE PLACEMENT N/A 02/13/2020   Procedure: TRACHEOSTOMY;  Surgeon: Leta Baptist, MD;  Location: [place] OR;  Service: ENT;  Laterality: N/A;   Patient Active Problem List   Diagnosis Date Noted   Postural dizziness with presyncope 06/08/2020   Hypercalcemia 06/08/2020   Hypertension    Chronic diastolic CHF (congestive heart failure) ([place])    Abnormality of gait 05/19/2020   Visual disturbance    Acute blood loss anemia    Schizophrenia (HCC)    Elevated triglycerides with high cholesterol    Controlled type 2 diabetes mellitus with hyperglycemia (HCC)    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[place]) 03/15/2020   Pressure injury of skin [id number]   Toxic metabolic encephalopathy    AKI (acute kidney injury) ([place])    Dehydration    Pancreatitis    Respiratory failure ([place])    DKA (diabetic ketoacidosis) ([place]) 01/25/2020   MDD (major depressive  disorder), recurrent, severe, with psychosis ([place]) 03/12/2018   Paranoid schizophrenia ([place])    Undifferentiated schizophrenia ([place])    Delusional disorder ([place])    Illiterate 10/30/2011   Schizoaffective disorder, depressive type ([place]) 10/27/2011   Schizoaffective disorder ([place]) 08/10/2011   Observed seizure-like activity ([place]) 08/09/2011   PAIN IN JOINT, MULTIPLE SITES 02/16/2010   FATIGUE 02/16/2010   Shortness of breath 02/16/2010    ONSET DATE: 08/09/21 referral   REFERRING DIAG: G82.20 (ICD-10-CM) - Paraplegia ([place])  THERAPY DIAG:  Other abnormalities of gait and mobility  Muscle weakness (generalized)  Unsteadiness on feet  Rationale for Evaluation and Treatment Rehabilitation  SUBJECTIVE:  SUBJECTIVE STATEMENT: Pt reports he is feeling more stiff today and that is why he is walking slow. Pt reports that his stiffness varies and unable to determine what makes his stiffness better or worse. Pt reports he was able to iron his clothes today for the first time, he and mom are excited!  Pt accompanied by: family member, mom  PERTINENT HISTORY: Went to ED 01/25/20 with DKA and AKI w/ newly diagnosed DM. Pt developed metabolic encephalopathy and anoxic brain damage as a result. PMH: schizoaffective disorder, HTN   PAIN:  Are you having pain? No  PRECAUTIONS: Fall  PATIENT GOALS "to get back walking"   OBJECTIVE:   TODAY'S TREATMENT:  THER EX: Initiated HEP with patient as well as walking program, see bolded below  GAIT:  [facility] PT Assessment - 10/12/21 1408       Ambulation/Gait   Gait velocity 32.8' over 130 sec = 0.25 ft/sec            Pt continues to exhibit decreased gait speed with significant amount of time needed to cover short distances. See gait speed assessment  above.  Trial gait with use of rollator x 5 ft with min A for balance as pt apprehensive about use of rollator. Pt exhibits ongoing step-to gait pattern and keeps his hands on brakes of rollator due to fear of falling and rollator getting away from him. Pt remains most safe and comfortable with continued use of his RW at this time but may benefit from trial use of rollator again in the future for energy conservation.   PATIENT EDUCATION: Education details: initiated HEP, trial use of rollator Person educated: Patient and Parent Education method: Explanation Education comprehension: verbalized understanding and needs further education   HOME EXERCISE PROGRAM: Access Code: [email or web address] URL: [email or web address] Date: 10/12/2021 Prepared by: Peter Congo  Exercises - Standing March with Counter Support  - 1 x daily - 7 x weekly - 3 sets - 10 reps - Sit to Stand with Counter Support  - 1 x daily - 7 x weekly - 3 sets - 5 reps - Standing Hip Abduction with Counter Support  - 1 x daily - 7 x weekly - 3 sets - 10 reps  Walking Program: Walk with your family. Walk during cooler times of the day. Starting next week, walk 5 min per day for 7 days. Following week add 5 minutes to your total time. Week 1: 10 min Week 2: 15 min Week 3: 20 min Week 4: 25 min...Marland KitchenMarland KitchenUntil you can walk to 30 min per day      GOALS: Goals reviewed with patient? Yes  SHORT TERM GOALS: = LTG based on POC length   LONG TERM GOALS: Target date: 11/14/2021  Pt will be independent with final HEP for improved endurance and strength   Baseline: to be provided Goal status: INITIAL  2.  Pt will improve 5x STS to </= 20 sec to demo improved functional LE strength and balance   Baseline: 26.03s with B UE Goal status: INITIAL  3.  Pt will improve TUG to </= 75 secs to demonstrated reduced fall risk  Baseline: 102s with RW Goal status: INITIAL  4.  Pt will improve gait velocity to at least 1.0 ft/sec  for improved gait efficiency and performance at mod I level   Baseline: 0.25 ft/sec with RW (8/17) Goal status: INITIAL    ASSESSMENT:  CLINICAL IMPRESSION: Emphasis of skilled PT session on trial use of rollator for gait and on  initiating HEP with patient. Pt exhibits some fear of falling and decreased safety with use of rollator at this time, would benefit from ongoing gait training to improve gait mechanics before attempting use of rollator again for energy conservation. See bolded exercises above for HEP and addition of walking program. Continue POC.    OBJECTIVE IMPAIRMENTS Abnormal gait, cardiopulmonary status limiting activity, decreased activity tolerance, decreased balance, decreased cognition, decreased endurance, decreased knowledge of condition, decreased knowledge of use of DME, decreased mobility, difficulty walking, and decreased strength.   ACTIVITY LIMITATIONS carrying, lifting, bending, standing, squatting, stairs, transfers, bathing, dressing, locomotion level, and caring for others  PARTICIPATION LIMITATIONS: meal prep, cleaning, laundry, interpersonal relationship, driving, shopping, community activity, and occupation  PERSONAL FACTORS Behavior pattern, Education, Fitness, Past/current experiences, Time since onset of injury/illness/exacerbation, Transportation, and 3+ comorbidities: metabolic encephalopathy and anoxic brain injury, HTN, schizoaffective disorder  are also affecting patient's functional outcome.   REHAB POTENTIAL: Fair time since onset  CLINICAL DECISION MAKING: Stable/uncomplicated  EVALUATION COMPLEXITY: Low  PLAN: PT FREQUENCY: 1x/week  PT DURATION: 4 weeks  PLANNED INTERVENTIONS: Therapeutic exercises, Therapeutic activity, Neuromuscular re-education, Balance training, Gait training, Patient/Family education, Self Care, Joint mobilization, Stair training, Vestibular training, Visual/preceptual remediation/compensation, Orthotic/Fit training,  DME instructions, Aquatic Therapy, Cognitive remediation, Wheelchair mobility training, Manual therapy, and Re-evaluation  PLAN FOR NEXT SESSION: how is HEP and walking program going? Add to HEP if needed, work on step-through gait pattern with RW and increasing gait speed, SciFit or Nustep   Peter Congo, PT, DPT, CSRS  10/12/2021, 2:45 PM    Managed medicaid CPT codes: 09983 - PT Re-evaluation, 97110- Therapeutic Exercise, [phone]- Neuro Re-education, [phone] - Gait Training, [phone] - Manual Therapy, 97530 - Therapeutic Activities, 97535 - Self Care, [phone] - Electrical stimulation (unattended), [phone] - Electrical stimulation (Manual), [id number] - Orthotic Fit, and [id number] - Aquatic therapy

## 2021-10-17 ENCOUNTER — Ambulatory Visit: Payer: Medicaid Other

## 2021-10-17 ENCOUNTER — Encounter: Payer: Medicaid Other | Admitting: Occupational Therapy

## 2021-10-17 DIAGNOSIS — R2689 Other abnormalities of gait and mobility: Secondary | ICD-10-CM

## 2021-10-17 DIAGNOSIS — R2681 Unsteadiness on feet: Secondary | ICD-10-CM

## 2021-10-17 DIAGNOSIS — R278 Other lack of coordination: Secondary | ICD-10-CM

## 2021-10-17 DIAGNOSIS — M6281 Muscle weakness (generalized): Secondary | ICD-10-CM

## 2021-10-17 NOTE — Therapy (Signed)
OUTPATIENT PHYSICAL THERAPY NEURO TREATMENT    Patient Name: Bryan [name] MRN: [id number] DOB:[birth year], 46 y.o., male Today's Date: 10/17/2021   PCP: Fleet Contras, MD REFERRING PROVIDER: Fleet Contras, MD   PT End of Session - 10/17/21 1532     Visit Number 3    Number of Visits 5    Date for PT Re-Evaluation 11/14/21    Authorization Type mcd    PT Start Time 1531    PT Stop Time 1611    PT Time Calculation (min) 40 min    Equipment Utilized During Treatment Gait belt    Activity Tolerance Patient limited by fatigue;Patient tolerated treatment well    Behavior During Therapy WFL for tasks assessed/performed              Past Medical History:  Diagnosis Date   Controlled type 2 diabetes mellitus with hyperglycemia (HCC)    Hypertension    Psychiatric problem    Seen at [facility] for unknown reason. Takes seroquel. History of hearing voices.  Not commandivng voices.    Schizophrenia (HCC)    Tremors of nervous system    Past Surgical History:  Procedure Laterality Date   none     TRACHEOSTOMY TUBE PLACEMENT N/A 02/13/2020   Procedure: TRACHEOSTOMY;  Surgeon: Newman Pies, MD;  Location: [facility] OR;  Service: ENT;  Laterality: N/A;   Patient Active Problem List   Diagnosis Date Noted   Postural dizziness with presyncope 06/08/2020   Hypercalcemia 06/08/2020   Hypertension    Chronic diastolic CHF (congestive heart failure) (HCC)    Abnormality of gait 05/19/2020   Visual disturbance    Acute blood loss anemia    Schizophrenia (HCC)    Elevated triglycerides with high cholesterol    Controlled type 2 diabetes mellitus with hyperglycemia (HCC)    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   Pressure injury of skin 02/16/2020   Toxic metabolic encephalopathy    AKI (acute kidney injury) (HCC)    Dehydration    Pancreatitis    Respiratory failure (HCC)    DKA (diabetic ketoacidosis) (HCC) 01/25/2020   MDD (major depressive disorder), recurrent, severe, 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   Paranoid schizophrenia (HCC)    Undifferentiated schizophrenia (HCC)    Delusional disorder (HCC)    Illiterate 10/30/2011   Schizoaffective disorder, depressive type (HCC) 10/27/2011   Schizoaffective disorder (HCC) 08/10/2011   Observed seizure-like activity (HCC) 08/09/2011   PAIN IN JOINT, MULTIPLE SITES 02/16/2010   FATIGUE 02/16/2010   Shortness of breath 02/16/2010    ONSET DATE: 08/09/21 referral   REFERRING DIAG: G82.20 (ICD-10-CM) - Paraplegia (HCC)  THERAPY DIAG:  Muscle weakness (generalized)  Other abnormalities of gait and mobility  Unsteadiness on feet  Other lack of coordination  Rationale for Evaluation and Treatment Rehabilitation  SUBJECTIVE:  SUBJECTIVE STATEMENT: Patient reports doing well. States that he hasn't been consistent with exercises because mom has to remind him to do so. He reports walking for ~2 mins at a time.   Pt accompanied by: family member, mom  PERTINENT HISTORY: Went to ED 01/25/20 with DKA and AKI w/ newly diagnosed DM. Pt developed metabolic encephalopathy and anoxic brain damage as a result. PMH: schizoaffective disorder, HTN   PAIN:  Are you having pain? No  PRECAUTIONS: Fall  PATIENT GOALS "to get back walking"   OBJECTIVE:   TODAY'S TREATMENT:  NMR: -SciFit hills level 1 x10 mins B UE/LE for improved large amplitude reciprocal coordination  GAIT: -62' x2 with RW + close supervision  -adjusted height of RW and changed tennis balls to allow walker to roll more smoothly   PATIENT EDUCATION: Education details: continue HEP Person educated: Patient and Parent Education method: Explanation Education comprehension: verbalized understanding and needs further education   HOME EXERCISE PROGRAM: Access  Code: [email or web address] URL: [email or web address] Date: 10/12/2021 Prepared by: Peter Congo  Exercises - Standing March with Counter Support  - 1 x daily - 7 x weekly - 3 sets - 10 reps - Sit to Stand with Counter Support  - 1 x daily - 7 x weekly - 3 sets - 5 reps - Standing Hip Abduction with Counter Support  - 1 x daily - 7 x weekly - 3 sets - 10 reps  Walking Program: Walk with your family. Walk during cooler times of the day. Starting next week, walk 5 min per day for 7 days. Following week add 5 minutes to your total time. Week 1: 10 min Week 2: 15 min Week 3: 20 min Week 4: 25 min...Marland KitchenMarland KitchenUntil you can walk to 30 min per day   GOALS: Goals reviewed with patient? Yes  SHORT TERM GOALS: = LTG based on POC length   LONG TERM GOALS: Target date: 11/14/2021  Pt will be independent with final HEP for improved endurance and strength   Baseline: to be provided Goal status: INITIAL  2.  Pt will improve 5x STS to </= 20 sec to demo improved functional LE strength and balance   Baseline: 26.03s with B UE Goal status: INITIAL  3.  Pt will improve TUG to </= 75 secs to demonstrated reduced fall risk  Baseline: 102s with RW Goal status: INITIAL  4.  Pt will improve gait velocity to at least 1.0 ft/sec for improved gait efficiency and performance at mod I level   Baseline: 0.25 ft/sec with RW (8/17) Goal status: INITIAL    ASSESSMENT:  CLINICAL IMPRESSION: Patient seen for skilled PT session with emphasis on gait retraining and B LE NMR. Patient remains with very slow gait speed, increased rigidity to his movements. He turns en bloc increasing his risk for instability and decreased fluidity of movement during functional ambulation. Patient minimally receptive to verbal cuing. Continue POC.    OBJECTIVE IMPAIRMENTS Abnormal gait, cardiopulmonary status limiting activity, decreased activity tolerance, decreased balance, decreased cognition, decreased endurance, decreased  knowledge of condition, decreased knowledge of use of DME, decreased mobility, difficulty walking, and decreased strength.   ACTIVITY LIMITATIONS carrying, lifting, bending, standing, squatting, stairs, transfers, bathing, dressing, locomotion level, and caring for others  PARTICIPATION LIMITATIONS: meal prep, cleaning, laundry, interpersonal relationship, driving, shopping, community activity, and occupation  PERSONAL FACTORS Behavior pattern, Education, Fitness, Past/current experiences, Time since onset of injury/illness/exacerbation, Transportation, and 3+ comorbidities: metabolic encephalopathy and anoxic brain injury, HTN, schizoaffective disorder  are also  affecting patient's functional outcome.   REHAB POTENTIAL: Fair time since onset  CLINICAL DECISION MAKING: Stable/uncomplicated  EVALUATION COMPLEXITY: Low  PLAN: PT FREQUENCY: 1x/week  PT DURATION: 4 weeks  PLANNED INTERVENTIONS: Therapeutic exercises, Therapeutic activity, Neuromuscular re-education, Balance training, Gait training, Patient/Family education, Self Care, Joint mobilization, Stair training, Vestibular training, Visual/preceptual remediation/compensation, Orthotic/Fit training, DME instructions, Aquatic Therapy, Cognitive remediation, Wheelchair mobility training, Manual therapy, and Re-evaluation  PLAN FOR NEXT SESSION: how is HEP and walking program going? Add to HEP if needed, work on step-through gait pattern with RW and increasing gait speed, SciFit or Nustep   Westley Foots, PT, DPT Westley Foots, PT, DPT, CBIS   10/17/2021, 4:15 PM    Managed medicaid CPT codes: 54627 - PT Re-evaluation, [phone]- Therapeutic Exercise, [phone]- Neuro Re-education, [phone] - Gait Training, 97140 - Manual Therapy, 97530 - Therapeutic Activities, 97535 - Self Care, 97014 - Electrical stimulation (unattended), [phone] - Electrical stimulation (Manual), [id number] - Orthotic Fit, and [id number] - Aquatic therapy

## 2021-10-24 ENCOUNTER — Ambulatory Visit: Payer: Medicaid Other

## 2021-10-24 ENCOUNTER — Encounter: Payer: Medicaid Other | Admitting: Occupational Therapy

## 2021-10-26 ENCOUNTER — Ambulatory Visit: Payer: Medicaid Other | Admitting: Physical Therapy

## 2021-10-26 ENCOUNTER — Encounter: Payer: Self-pay | Admitting: Physical Therapy

## 2021-10-26 DIAGNOSIS — R2681 Unsteadiness on feet: Secondary | ICD-10-CM

## 2021-10-26 DIAGNOSIS — R278 Other lack of coordination: Secondary | ICD-10-CM | POA: Diagnosis not present

## 2021-10-26 DIAGNOSIS — M6281 Muscle weakness (generalized): Secondary | ICD-10-CM

## 2021-10-26 DIAGNOSIS — R2689 Other abnormalities of gait and mobility: Secondary | ICD-10-CM

## 2021-10-26 NOTE — Therapy (Signed)
OUTPATIENT PHYSICAL THERAPY NEURO TREATMENT    Patient Name: Bryan [name] MRN: [id number] DOB:[birth year], 46 y.o., male Today's Date: 10/26/2021   PCP: Fleet Contras, MD R  10/26/21 1455  PT Visits / Re-Eval  Visit Number 4  Number of Visits 5  Date for PT Re-Evaluation 11/14/21  Authorization  Authorization Type mcd  PT Time Calculation  PT Start Time 1450  PT Stop Time 1530  PT Time Calculation (min) 40 min  PT - End of Session  Equipment Utilized During Treatment Gait belt  Activity Tolerance Patient limited by fatigue;Patient tolerated treatment well  Behavior During Therapy WFL for tasks assessed/performed  EFERRING PROVIDER: Fleet Contras, MD       Past Medical History:  Diagnosis Date   Controlled type 2 diabetes mellitus with hyperglycemia (HCC)    Hypertension    Psychiatric problem    Seen at [facility] for unknown reason. Takes seroquel. History of hearing voices.  Not commandivng voices.    Schizophrenia (HCC)    Tremors of nervous system    Past Surgical History:  Procedure Laterality Date   none     TRACHEOSTOMY TUBE PLACEMENT N/A 02/13/2020   Procedure: TRACHEOSTOMY;  Surgeon: Newman Pies, MD;  Location: [facility] OR;  Service: ENT;  Laterality: N/A;   Patient Active Problem List   Diagnosis Date Noted   Postural dizziness with presyncope 06/08/2020   Hypercalcemia 06/08/2020   Hypertension    Chronic diastolic CHF (congestive heart failure) (HCC)    Abnormality of gait 05/19/2020   Visual disturbance    Acute blood loss anemia    Schizophrenia (HCC)    Elevated triglycerides with high cholesterol    Controlled type 2 diabetes mellitus with hyperglycemia (HCC)    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   Pressure injury of skin 02/16/2020   Toxic metabolic encephalopathy    AKI (acute kidney injury) (HCC)    Dehydration    Pancreatitis    Respiratory failure (HCC)    DKA (diabetic ketoacidosis) (HCC) 01/25/2020   MDD (major depressive disorder),  recurrent, severe, 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   Paranoid schizophrenia (HCC)    Undifferentiated schizophrenia (HCC)    Delusional disorder (HCC)    Illiterate 10/30/2011   Schizoaffective disorder, depressive type (HCC) 10/27/2011   Schizoaffective disorder (HCC) 08/10/2011   Observed seizure-like activity (HCC) 08/09/2011   PAIN IN JOINT, MULTIPLE SITES 02/16/2010   FATIGUE 02/16/2010   Shortness of breath 02/16/2010    ONSET DATE: 08/09/21 referral   REFERRING DIAG: G82.20 (ICD-10-CM) - Paraplegia (HCC)  THERAPY DIAG:  Muscle weakness (generalized)  Other abnormalities of gait and mobility  Unsteadiness on feet  Rationale for Evaluation and Treatment Rehabilitation   PRECAUTIONS: Fall   PATIENT GOALS: "to get back walking"    PERTINENT HISTORY: Went to ED 01/25/20 with DKA and AKI w/ newly diagnosed DM. Pt developed metabolic encephalopathy and anoxic brain damage as a result. PMH: schizoaffective disorder, HTN     SUBJECTIVE: No new complaints. States HEP is going well. Has not been walking more at home as he reports no room to walk inside.  Pt accompanied by: family member, mom  PAIN:  Are you having pain? No      TODAY'S TREATMENT:  STRENGTHENING  Scifit UE/LE[place] setting, dual peaks course on level 1.5 x 8 minutes for improved large amplitude reciprocal coordination, strengthening and aerobic conditioning.  Seated edge of mat with hands on knees: sit<>stands x 10 reps with CGA to min assist for balance upon standing and for controlled descent.  GAIT: Gait pattern: step through pattern, decreased step length- Right, decreased stance time- Left, and decreased stride length Distance walked: 115 x 1, plus around clinic with session Assistive device utilized: Walker - 2  wheeled Level of assistance: SBA Comments: cues for increased right step length with decreased left step length for more reciprocal gait with increased gait speed  BALANCE/NMR: With support on chair back/HHA on other side: Alternating forward/backward stepping over black bolster with pt unable to fully clear foot over surface. Changed to yard stick with cues needed for anterior weight shifting onto stepping LE. Alternating foot taps to 6 inch box for ~8-9 reps each side with cues for hip flexion to reach top of box.     PATIENT EDUCATION: Education details: continue HEP; plan for discharge at next session Person educated: Patient and Parent Education method: Explanation Education comprehension: verbalized understanding and needs further education   HOME EXERCISE PROGRAM: Access Code: [email or web address] URL: [email or web address] Date: 10/12/2021 Prepared by: Peter Congo  Exercises - Standing March with Counter Support  - 1 x daily - 7 x weekly - 3 sets - 10 reps - Sit to Stand with Counter Support  - 1 x daily - 7 x weekly - 3 sets - 5 reps - Standing Hip Abduction with Counter Support  - 1 x daily - 7 x weekly - 3 sets - 10 reps  Walking Program: Walk with your family. Walk during cooler times of the day. Starting next week, walk 5 min per day for 7 days. Following week add 5 minutes to your total time. Week 1: 10 min Week 2: 15 min Week 3: 20 min Week 4: 25 min...Marland KitchenMarland KitchenUntil you can walk to 30 min per day   GOALS: Goals reviewed with patient? Yes  SHORT TERM GOALS: = LTG based on POC length   LONG TERM GOALS: Target date: 11/14/2021  Pt will be independent with final HEP for improved endurance and strength   Baseline: to be provided Goal status: INITIAL  2.  Pt will improve 5x STS to </= 20 sec to demo improved functional LE strength and balance   Baseline: 26.03s with B UE Goal status: INITIAL  3.  Pt will improve TUG to </= 75 secs to demonstrated reduced  fall risk Baseline: 102s with RW Goal status: INITIAL  4.  Pt will improve gait velocity to at least 1.0 ft/sec for improved gait efficiency and performance at mod I level  Baseline: 0.25 ft/sec with RW (8/17) Goal status: INITIAL    ASSESSMENT:  CLINICAL IMPRESSION: Today's skilled session continued to focus on strengthening and balance/gait task to promote increased step length/gait speed. Minimal carryover noted at end of session unless cues provided.    OBJECTIVE IMPAIRMENTS Abnormal gait, cardiopulmonary status limiting activity, decreased activity tolerance, decreased balance, decreased cognition, decreased endurance, decreased knowledge of condition, decreased knowledge of use of DME, decreased mobility, difficulty walking, and decreased strength.   ACTIVITY LIMITATIONS carrying, lifting, bending, standing, squatting, stairs, transfers, bathing, dressing, locomotion level, and caring for others  PARTICIPATION LIMITATIONS: meal prep, cleaning, laundry,  interpersonal relationship, driving, shopping, community activity, and occupation  PERSONAL FACTORS Behavior pattern, Education, Fitness, Past/current experiences, Time since onset of injury/illness/exacerbation, Transportation, and 3+ comorbidities: metabolic encephalopathy and anoxic brain injury, HTN, schizoaffective disorder  are also affecting patient's functional outcome.   REHAB POTENTIAL: Fair time since onset  CLINICAL DECISION MAKING: Stable/uncomplicated  EVALUATION COMPLEXITY: Low  PLAN: PT FREQUENCY: 1x/week  PT DURATION: 4 weeks  PLANNED INTERVENTIONS: Therapeutic exercises, Therapeutic activity, Neuromuscular re-education, Balance training, Gait training, Patient/Family education, Self Care, Joint mobilization, Stair training, Vestibular training, Visual/preceptual remediation/compensation, Orthotic/Fit training, DME instructions, Aquatic Therapy, Cognitive remediation, Wheelchair mobility training, Manual  therapy, and Re-evaluation  PLAN FOR NEXT SESSION:  Check goals for anticipated discharge    Sallyanne Kuster, PTA, [facility] [address], [address] [place], [place] [address] [phone] 10/26/21, 2:56 PM     Managed medicaid CPT codes: [phone] - PT Re-evaluation, 97110- Therapeutic Exercise, [phone]- Neuro Re-education, [phone] - Gait Training, [phone] - Manual Therapy, 97530 - Therapeutic Activities, 97535 - Self Care, 97014 - Electrical stimulation (unattended), [id number] - Electrical stimulation (Manual), [id number] - Orthotic Fit, and [id number] - Aquatic therapy

## 2021-10-31 ENCOUNTER — Encounter: Payer: Self-pay | Admitting: Physical Therapy

## 2021-10-31 ENCOUNTER — Ambulatory Visit: Payer: Medicaid Other | Attending: Internal Medicine | Admitting: Physical Therapy

## 2021-10-31 DIAGNOSIS — M6281 Muscle weakness (generalized): Secondary | ICD-10-CM | POA: Insufficient documentation

## 2021-10-31 DIAGNOSIS — R2681 Unsteadiness on feet: Secondary | ICD-10-CM | POA: Insufficient documentation

## 2021-10-31 DIAGNOSIS — R2689 Other abnormalities of gait and mobility: Secondary | ICD-10-CM | POA: Insufficient documentation

## 2021-10-31 NOTE — Therapy (Addendum)
OUTPATIENT PHYSICAL THERAPY NEURO TREATMENT/DISCHARGE SUMMARY    Patient Name: Bryan [name] MRN: [id number] DOB:[birth year], 46 y.o., male Today's Date: 10/31/2021  PHYSICAL THERAPY DISCHARGE SUMMARY  Visits from Start of Care: 5  Current functional level related to goals / functional outcomes: Patient remains ModI with RW; he met 3/4 LTG and made progress toward the remaining goal.   Remaining deficits: Impaired balance, decreased functional strength, poor gait mechanics, decreased endurance   Education / Equipment: HEP, PT POC   Patient agrees to discharge. Patient goals were partially met. Patient is being discharged due to meeting the stated rehab goals.  PCP: Nolene Ebbs, MD REFERRING PROVIDER: Nolene Ebbs, MD   PT End of Session - 10/31/21 1454     Visit Number 5    Number of Visits 5    Date for PT Re-Evaluation 11/14/21    Authorization Type mcd    PT Start Time 1448    PT Stop Time 1526    PT Time Calculation (min) 38 min    Equipment Utilized During Treatment --    Activity Tolerance Patient tolerated treatment well    Behavior During Therapy WFL for tasks assessed/performed                 Past Medical History:  Diagnosis Date   Controlled type 2 diabetes mellitus with hyperglycemia ([place])    Hypertension    Psychiatric problem    Seen at [facility] for unknown reason. Takes seroquel. History of hearing voices.  Not commandivng voices.    Schizophrenia ([place])    Tremors of nervous system    Past Surgical History:  Procedure Laterality Date   none     TRACHEOSTOMY TUBE PLACEMENT N/A 02/13/2020   Procedure: TRACHEOSTOMY;  Surgeon: Leta Baptist, MD;  Location: [facility] OR;  Service: ENT;  Laterality: N/A;   Patient Active Problem List   Diagnosis Date Noted   Postural dizziness with presyncope 06/08/2020   Hypercalcemia 06/08/2020   Hypertension    Chronic diastolic CHF (congestive heart failure) ([place])    Abnormality of gait 05/19/2020   Visual  disturbance    Acute blood loss anemia    Schizophrenia (HCC)    Elevated triglycerides with high cholesterol    Controlled type 2 diabetes mellitus with hyperglycemia (HCC)    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[place]) 03/15/2020   Pressure injury of skin 74/09/1446   Toxic metabolic encephalopathy    AKI (acute kidney injury) ([place])    Dehydration    Pancreatitis    Respiratory failure ([place])    DKA (diabetic ketoacidosis) ([place]) 01/25/2020   MDD (major depressive disorder), recurrent, severe, with psychosis ([place]) 03/12/2018   Paranoid schizophrenia ([place])    Undifferentiated schizophrenia ([place])    Delusional disorder ([place])    Illiterate 10/30/2011   Schizoaffective disorder, depressive type ([place]) 10/27/2011   Schizoaffective disorder ([place]) 08/10/2011   Observed seizure-like activity ([place]) 08/09/2011   PAIN IN JOINT, MULTIPLE SITES 02/16/2010   FATIGUE 02/16/2010   Shortness of breath 02/16/2010    ONSET DATE: 08/09/21 referral   REFERRING DIAG: G82.20 (ICD-10-CM) - Paraplegia ([place])  THERAPY DIAG:  Muscle weakness (generalized)  Other abnormalities of gait and mobility  Unsteadiness on feet  Rationale for Evaluation and Treatment Rehabilitation   PRECAUTIONS: Fall   PATIENT GOALS: "to get back walking"    PERTINENT HISTORY: Went to ED 01/25/20 with DKA and AKI w/ newly diagnosed DM. Pt developed metabolic encephalopathy and anoxic brain damage as a result. PMH: schizoaffective disorder, HTN  SUBJECTIVE: No new complaints. States HEP is going well. No falls.                                                                                                                                                                                           Pt accompanied by: family member, mom  PAIN:  Are you having pain? No      TODAY'S TREATMENT:  [place] setting, dual peaks course on level 2.0 x 8 minutes for improved large amplitude reciprocal coordination,  strengthening and aerobic conditioning.   Reviewed HEP. No issues noted. Pt reports doing them at home with mom helping to keep him on track.   5 time sit to stand: 21.56 sec's with UE support from standard height chair   GAIT: Gait pattern: step through pattern, decreased step length- Right, decreased stance time- Left, and decreased stride length Distance walked: 220 x 1, plus around clinic with session Assistive device utilized: Walker - 2 wheeled Level of assistance: SBA Comments: cues for increased right step length with decreased left step length for more reciprocal gait with increased gait speed  10 meter gait speed: 26.25 sec's = 1.25 ft/sec with RW  Timed Up and Go: 60.12 sec's with RW      PATIENT EDUCATION: Education details: continue HEP; progress toward goals Person educated: Patient and Parent Education method: Explanation Education comprehension: verbalized understanding and needs further education   HOME EXERCISE PROGRAM: Access Code: [email or web address] URL: [email or web address] Date: 10/12/2021 Prepared by: Excell Seltzer  Exercises - Standing March with Counter Support  - 1 x daily - 7 x weekly - 3 sets - 10 reps - Sit to Stand with Counter Support  - 1 x daily - 7 x weekly - 3 sets - 5 reps - Standing Hip Abduction with Counter Support  - 1 x daily - 7 x weekly - 3 sets - 10 reps  Walking Program: Walk with your family. Walk during cooler times of the day. Starting next week, walk 5 min per day for 7 days. Following week add 5 minutes to your total time. Week 1: 10 min Week 2: 15 min Week 3: 20 min Week 4: 25 min...Marland KitchenMarland KitchenUntil you can walk to 30 min per day   GOALS: Goals reviewed with patient? Yes  SHORT TERM GOALS: = LTG based on POC length   LONG TERM GOALS: Target date: 11/14/2021  Pt will be independent with final HEP for improved endurance and strength   Baseline: 10/31/21: met with current HEP Goal status: MET  2.  Pt will improve 5x  STS to </= 20 sec to demo  improved functional LE strength and balance   Baseline:10/31/21: 21.56 sec's with UE support from standard height surface, improved from 26.03s with B UE, just not to goal Goal status: PARTIALLY MET  3.  Pt will improve TUG to </= 75 secs to demonstrated reduced fall risk Baseline: 10/31/21: 60.12 sec's with RW  Goal status: MET  4.  Pt will improve gait velocity to at least 1.0 ft/sec for improved gait efficiency and performance at mod I level  Baseline: 10/31/21: 1.25 ft/sec Goal status: MET    ASSESSMENT:  CLINICAL IMPRESSION: Today's skilled session focused on progress toward goals with all goals met except for 5 time sit to stand goal partially met. Pt improved gait speed to 1.25 ft/sec, TUG to 60.12 sec's and 5 time sit to stand to 21.56 sec's. Pt and mom happy with progress and agreeable to discharge today.    OBJECTIVE IMPAIRMENTS Abnormal gait, cardiopulmonary status limiting activity, decreased activity tolerance, decreased balance, decreased cognition, decreased endurance, decreased knowledge of condition, decreased knowledge of use of DME, decreased mobility, difficulty walking, and decreased strength.   ACTIVITY LIMITATIONS carrying, lifting, bending, standing, squatting, stairs, transfers, bathing, dressing, locomotion level, and caring for others  PARTICIPATION LIMITATIONS: meal prep, cleaning, laundry, interpersonal relationship, driving, shopping, community activity, and occupation  PERSONAL FACTORS Behavior pattern, Education, Fitness, Past/current experiences, Time since onset of injury/illness/exacerbation, Transportation, and 3+ comorbidities: metabolic encephalopathy and anoxic brain injury, HTN, schizoaffective disorder  are also affecting patient's functional outcome.   REHAB POTENTIAL: Fair time since onset  CLINICAL DECISION MAKING: Stable/uncomplicated  EVALUATION COMPLEXITY: Low  PLAN: PT FREQUENCY: 1x/week  PT DURATION: 4  weeks  PLANNED INTERVENTIONS: Therapeutic exercises, Therapeutic activity, Neuromuscular re-education, Balance training, Gait training, Patient/Family education, Self Care, Joint mobilization, Stair training, Vestibular training, Visual/preceptual remediation/compensation, Orthotic/Fit training, DME instructions, Aquatic Therapy, Cognitive remediation, Wheelchair mobility training, Manual therapy, and Re-evaluation  PLAN FOR NEXT SESSION:  Discharge per PT POC    Willow Ora, PTA, [facility] [address] [place], [place] [address] [phone] 10/31/21, 3:35 PM   Addendum: Debbora Dus, PT, DPT, CBIS  Managed medicaid CPT codes: [phone] - PT Re-evaluation, [phone]- Therapeutic Exercise, [phone]- Neuro Re-education, [phone] - Gait Training, [phone] - Manual Therapy, 97530 - Therapeutic Activities, [id number] - [place], [id number] - Electrical stimulation (unattended), [phone] - Electrical stimulation (Manual), [place], and [id number] - Aquatic therapy

## 2022-04-03 ENCOUNTER — Ambulatory Visit: Payer: Medicaid Other | Attending: Internal Medicine

## 2022-04-03 DIAGNOSIS — R2689 Other abnormalities of gait and mobility: Secondary | ICD-10-CM | POA: Diagnosis present

## 2022-04-03 DIAGNOSIS — M6281 Muscle weakness (generalized): Secondary | ICD-10-CM | POA: Insufficient documentation

## 2022-04-03 DIAGNOSIS — R2681 Unsteadiness on feet: Secondary | ICD-10-CM | POA: Diagnosis present

## 2022-04-03 DIAGNOSIS — R278 Other lack of coordination: Secondary | ICD-10-CM | POA: Insufficient documentation

## 2022-04-03 NOTE — Therapy (Unsigned)
OUTPATIENT PHYSICAL THERAPY NEURO EVALUATION   Patient Name: Bryan [name] MRN: [id number] DOB:1975/05/26, 47 y.o., male Today's Date: 04/03/2022   PCP: Nolene Ebbs, MD REFERRING PROVIDER: Nolene Ebbs, MD  END OF SESSION:  PT End of Session - 04/03/22 1429     Visit Number 1    Number of Visits 1    Authorization Type medicaid    PT Start Time 1536   patient in the bathroom   PT Stop Time 6378    PT Time Calculation (min) 41 min    Equipment Utilized During Treatment Gait belt    Activity Tolerance Patient tolerated treatment well    Behavior During Therapy WFL for tasks assessed/performed             Past Medical History:  Diagnosis Date   Controlled type 2 diabetes mellitus with hyperglycemia ([place])    Hypertension    Psychiatric problem    Seen at [facility] for unknown reason. Takes seroquel. History of hearing voices.  Not commandivng voices.    Schizophrenia ([place])    Tremors of nervous system    Past Surgical History:  Procedure Laterality Date   none     TRACHEOSTOMY TUBE PLACEMENT N/A 02/13/2020   Procedure: TRACHEOSTOMY;  Surgeon: Leta Baptist, MD;  Location: [facility] OR;  Service: ENT;  Laterality: N/A;   Patient Active Problem List   Diagnosis Date Noted   Postural dizziness with presyncope 06/08/2020   Hypercalcemia 06/08/2020   Hypertension    Chronic diastolic CHF (congestive heart failure) ([place])    Abnormality of gait 05/19/2020   Visual disturbance    Acute blood loss anemia    Schizophrenia (HCC)    Elevated triglycerides with high cholesterol    Controlled type 2 diabetes mellitus with hyperglycemia (HCC)    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[place]) 03/15/2020   Pressure injury of skin 58/85/0277   Toxic metabolic encephalopathy    AKI (acute kidney injury) ([place])    Dehydration    Pancreatitis    Respiratory failure ([place])    DKA (diabetic ketoacidosis) ([place]) 01/25/2020   MDD (major depressive disorder), recurrent, severe, with psychosis ([place]) 03/12/2018    Paranoid schizophrenia ([place])    Undifferentiated schizophrenia ([place])    Delusional disorder ([place])    Illiterate 10/30/2011   Schizoaffective disorder, depressive type ([place]) 10/27/2011   Schizoaffective disorder ([place]) 08/10/2011   Observed seizure-like activity ([place]) 08/09/2011   PAIN IN JOINT, MULTIPLE SITES 02/16/2010   FATIGUE 02/16/2010   Shortness of breath 02/16/2010    ONSET DATE: 03/28/2022 referral  REFERRING DIAG: G82.20 (ICD-10-CM) - Paraplegia ([place])  THERAPY DIAG:  Muscle weakness (generalized)  Other abnormalities of gait and mobility  Other lack of coordination  Unsteadiness on feet  Rationale for Evaluation and Treatment: Rehabilitation  SUBJECTIVE:  SUBJECTIVE STATEMENT: Patient is very familiar to this clinic and he arrives with his mom and brother. Patient reports doing "not much" at home. Mom reporting that he's not exercising and "doesn't want to move" at home. Patient reporting that he feels as though his legs are thinner and weaker, but admits to not doing exercises. Patients current RW is rusted over and the wheels essentially do not rotate.  Pt accompanied by: family member  PERTINENT HISTORY: Went to ED 01/25/20 with DKA and AKI w/ newly diagnosed DM. Pt developed metabolic encephalopathy and anoxic brain damage as a result. PMH: schizoaffective disorder, HTN  PAIN:  Are you having pain? No  PRECAUTIONS: Fall  WEIGHT BEARING RESTRICTIONS: No  FALLS: Has patient fallen in last 6 months? No  LIVING ENVIRONMENT: Lives with: lives with their family Lives in: House/apartment Stairs: Yes: External: 1 steps; can reach both Has following equipment at home: Environmental consultant - 2 wheeled, Shower bench, and Grab bars  PLOF: Requires assistive device for independence, Needs  assistance with ADLs, and Needs assistance with homemaking  PATIENT GOALS: "to get where I was before"  OBJECTIVE:   COGNITION: Overall cognitive status: History of cognitive impairments - at baseline   SENSATION: WFL  COORDINATION: WFL B LE  POSTURE: rounded shoulders, forward head, increased thoracic kyphosis, posterior pelvic tilt, and flexed trunk    LOWER EXTREMITY MMT:    MMT Right Eval Left Eval  Hip flexion 5 5  Hip extension    Hip abduction 5 5  Hip adduction 5 5  Hip internal rotation    Hip external rotation    Knee flexion 4 4  Knee extension 5 5  Ankle dorsiflexion 5 4  Ankle plantarflexion    Ankle inversion    Ankle eversion    (Blank rows = not tested)  BED MOBILITY:  Able to complete independently   TRANSFERS: Assistive device utilized: Environmental consultant - 2 wheeled  Sit to stand: Modified independence Stand to sit: Modified independence Chair to chair: Modified independence  GAIT: Gait pattern: step to pattern, decreased step length- Right, decreased hip/knee flexion- Right, decreased hip/knee flexion- Left, Right foot flat, Left foot flat, trunk flexed, wide BOS, poor foot clearance- Right, and poor foot clearance- Left Distance walked: clinic Assistive device utilized: Walker - 2 wheeled Level of assistance: Modified independence Comments: very slow gait speed  FUNCTIONAL TESTS:   [facility] PT Assessment - 04/03/22 0001       Standardized Balance Assessment   Standardized Balance Assessment 10 meter walk test    10 Meter Walk .26m/s      Timed Up and Go Test   Normal TUG (seconds) 115   RW+ CGA              TODAY'S TREATMENT:  N/A eval   PATIENT EDUCATION: Education details: PT POC, exam findings, importance of general mobility  Person educated: Patient and Parent Education method: Explanation Education  comprehension: verbalized understanding and needs further education  HOME EXERCISE PROGRAM: Provided during previous bout of therapy -out of bed/room for at least 2 meals/ day  -exercise a total of 15 mins/ day   GOALS: Not needed as patient will not benefit from skilled PT at this time.   ASSESSMENT:  CLINICAL IMPRESSION: Patient is a 47 y.o. male who was seen today for physical therapy evaluation and treatment for general debility and weakness. He was previously seen at this clinic ~5 months ago and reports that since then he is spending a majority of his time in bed. Patient reporting decreased LE strength, but admits to "not using" his legs much since dc. 10 Meter Walk Test: Patient instructed to walk 10 meters (32.8 ft) as quickly and as safely as possible at their normal speed x2 and at a fast speed x2. Time measured from 2 meter mark to 8 meter mark to accommodate ramp-up and ramp-down.  Normal speed: .66m/s Cut off scores: <0.4 m/s = household Ambulator, 0.4-0.8 m/s = limited community Ambulator, >0.8 m/s = community Ambulator, >1.2 m/s = crossing a street, <1.0 = increased fall risk MCID 0.05 m/s (small), 0.13 m/s (moderate), 0.06 m/s (significant)  (ANPTA Core Set of Outcome Measures for Adults with Neurologic Conditions, 2018). Patient completed the Timed Up and Go test (TUG) in 115 seconds.  Geriatrics: need for further assessment of fall risk: ? 12 sec; Recurrent falls: > 15 sec; Vestibular Disorders fall risk: > 15 sec; Parkinson's Disease fall risk: > 16 sec ([email or web address], 2023). Patient is functioning at essentially the same level as previous bout of therapy and thus is not a good candidate for skilled physical therapy rehabilitation. PT instructing patient to eat at least 2 meals a day outside of his room and exercise for a total of 15 mins/day (walking, marching in place, his pedal bike) for at least a month before returning with a new referral. Patient and mom verbalized  understanding.   CLINICAL DECISION MAKING: Stable/uncomplicated  EVALUATION COMPLEXITY: Low  PLAN:  PT FREQUENCY: one time visit  PT DURATION: other: 1 time visit   Debbora Dus, PT Debbora Dus, PT, DPT, CBIS  04/03/2022, 4:20 PM

## 2022-04-12 ENCOUNTER — Ambulatory Visit: Payer: Medicaid Other

## 2022-04-17 ENCOUNTER — Ambulatory Visit: Payer: Medicaid Other

## 2022-04-26 ENCOUNTER — Ambulatory Visit: Payer: Medicaid Other

## 2022-05-01 ENCOUNTER — Ambulatory Visit: Payer: Medicaid Other

## 2022-05-10 ENCOUNTER — Ambulatory Visit: Payer: Medicaid Other

## 2022-05-15 ENCOUNTER — Ambulatory Visit: Payer: Medicaid Other

## 2022-05-24 ENCOUNTER — Ambulatory Visit: Payer: Medicaid Other

## 2022-05-29 ENCOUNTER — Ambulatory Visit: Payer: Medicaid Other

## 2022-06-07 ENCOUNTER — Ambulatory Visit: Payer: Medicaid Other

## 2022-09-27 ENCOUNTER — Other Ambulatory Visit: Payer: Self-pay | Admitting: Internal Medicine

## 2022-11-24 IMAGING — CT CT ABD-PELV W/O CM
2 of 6 series · 16 of 46 positions shown, 18 images · non-contrast
Comparison: CT chest 01/27/2020

CLINICAL DATA: Acute severe pancreatitis

EXAM:
CT ABDOMEN AND PELVIS WITHOUT CONTRAST
TECHNIQUE: Multidetector CT imaging of the abdomen and pelvis was performed
following the standard protocol without IV contrast. Sagittal and
coronal MPR images reconstructed from axial data set. No oral
contrast administered. Beam artifacts in upper abdomen from
inclusion of patient's arms and imaged field.

[Series 3: a/p w/o 5mm · axial · non-contrast · 0.98mm/px · z∈[+994,+1450]mm · 13 of 103 slices shown, 15 images]
[im 6/103  soft-tissue]
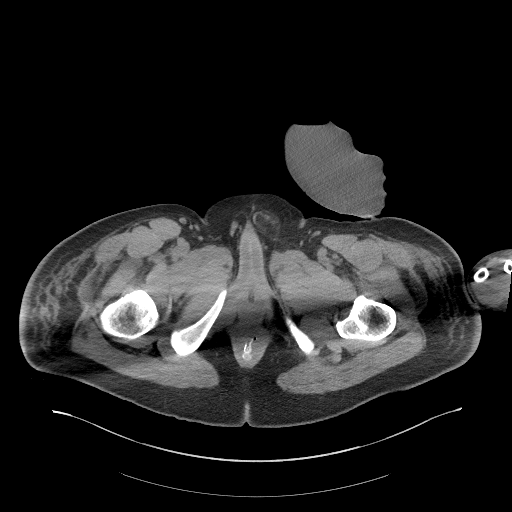
[im 6/103  bone]
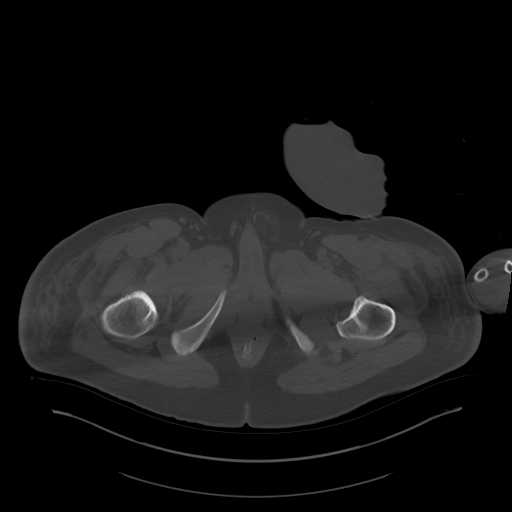
[im 16/103  soft-tissue]
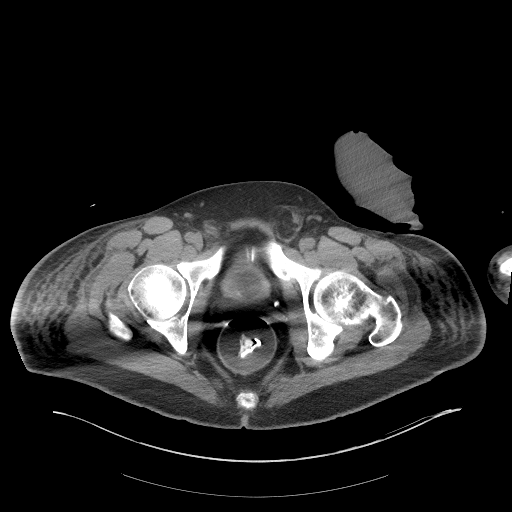
[im 21/103  soft-tissue]
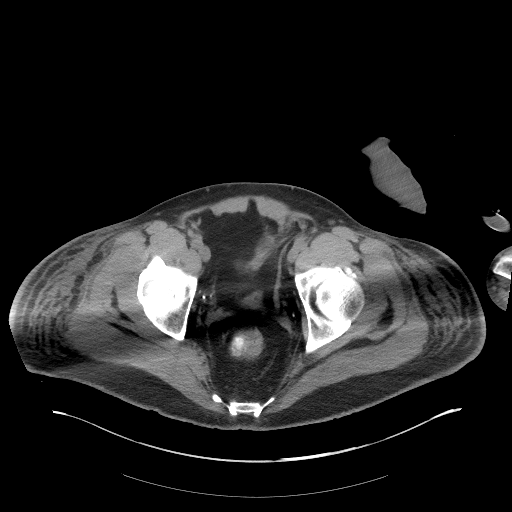
[im 31/103  soft-tissue]
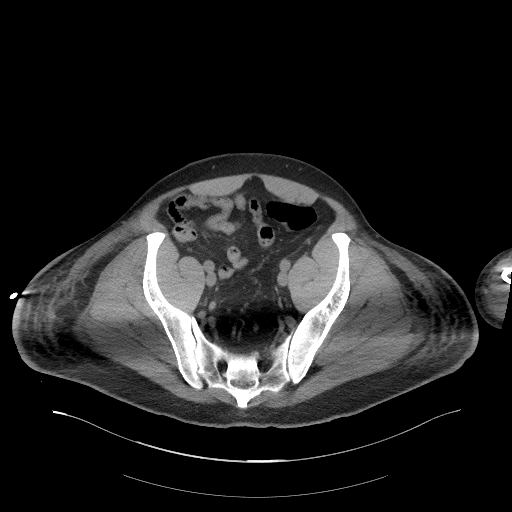
[im 36/103  soft-tissue]
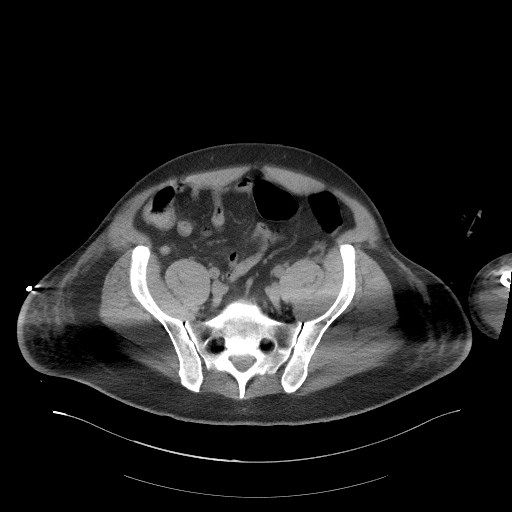
[im 46/103  soft-tissue]
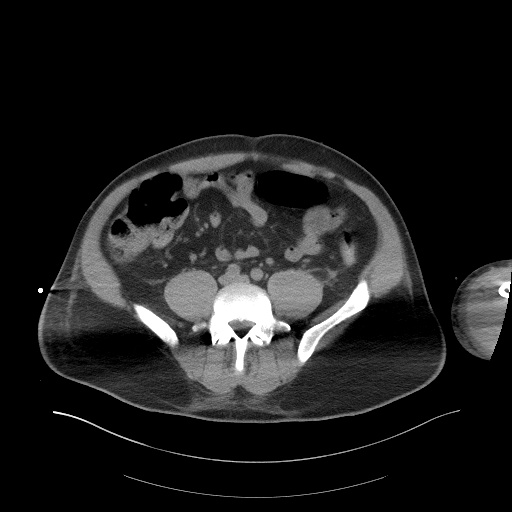
[im 52/103  soft-tissue]
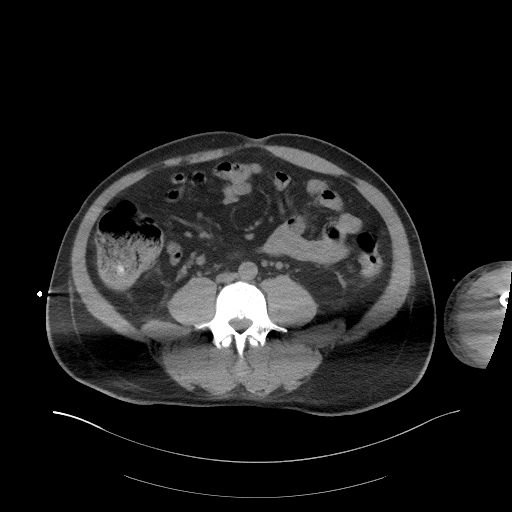
[im 57/103  soft-tissue]
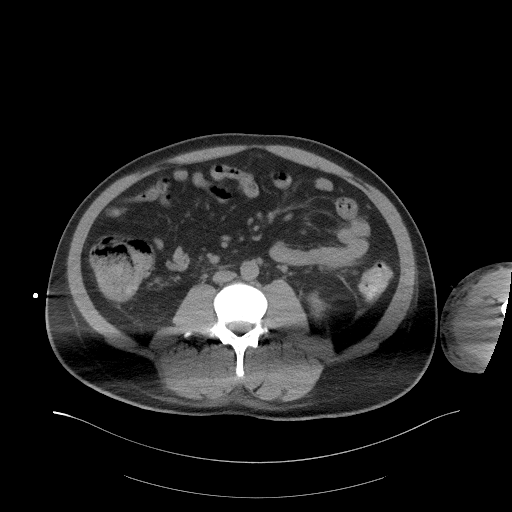
[im 67/103  soft-tissue]
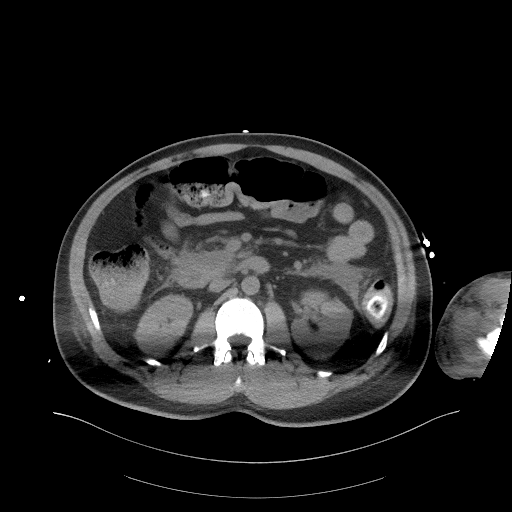
[im 67/103  bone]
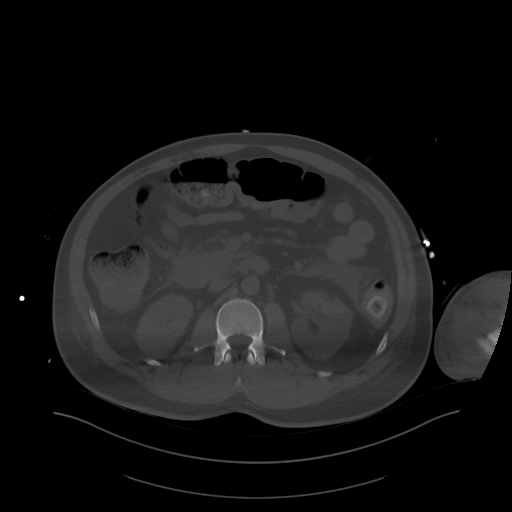
[im 72/103  soft-tissue]
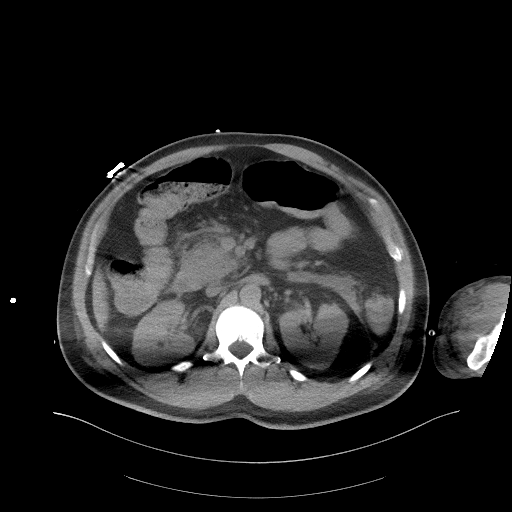
[im 82/103  soft-tissue]
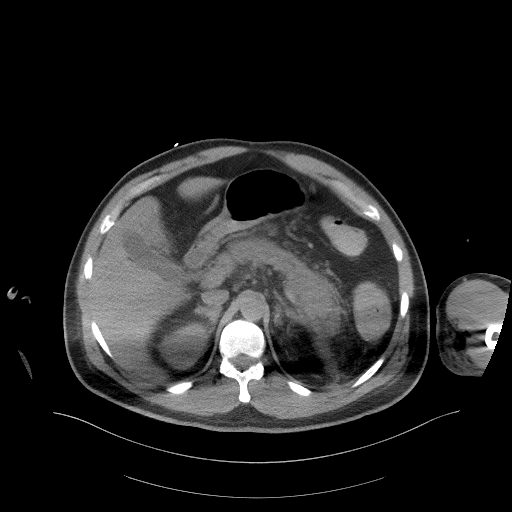
[im 87/103  soft-tissue]
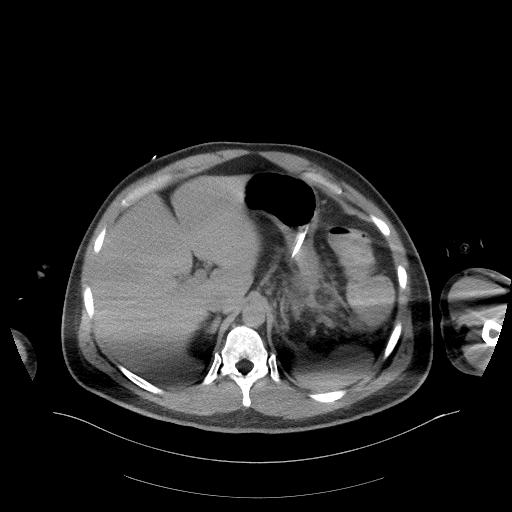
[im 97/103  soft-tissue]
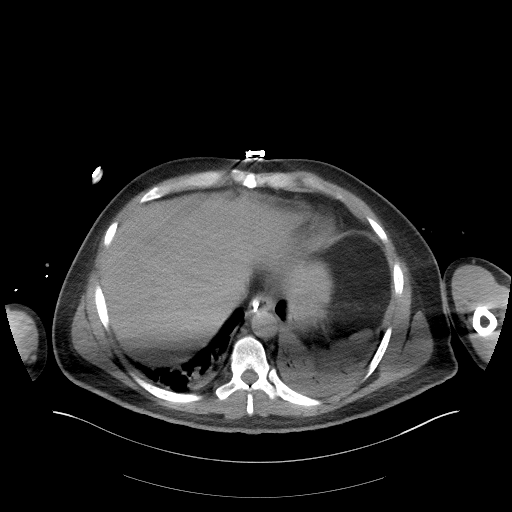

[Series 6: a/p w/o cor · coronal · non-contrast · 0.77mm/px · 3 of 140 slices shown]
[im 47/140  soft-tissue]
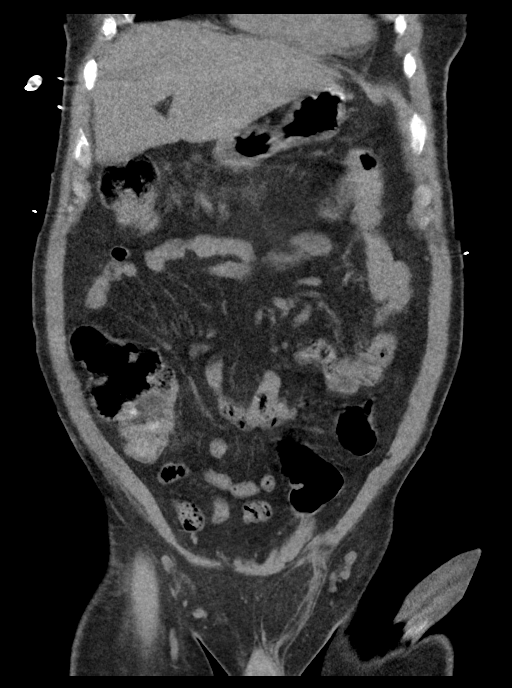
[im 62/140  soft-tissue]
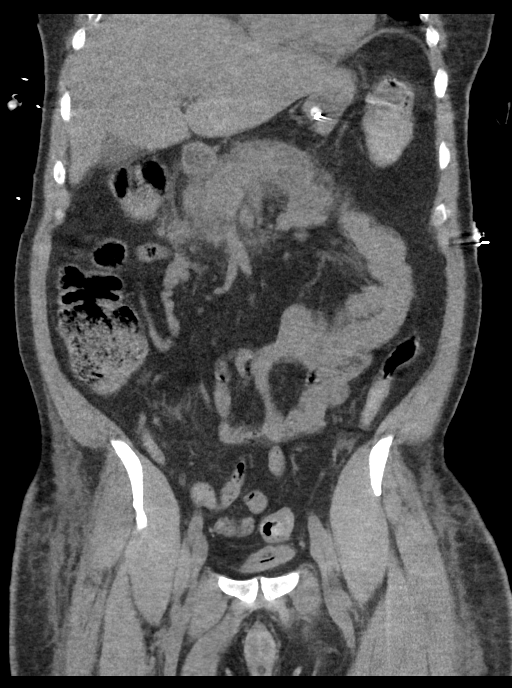
[im 78/140  soft-tissue]
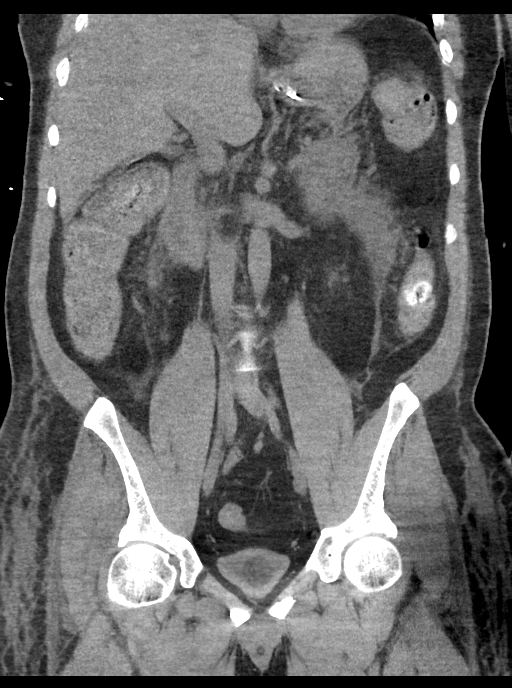

[16 of 46 positions shown; findings below may reference images not displayed]

FINDINGS: Lower chest: BILATERAL lower lobe consolidation LEFT greater than
RIGHT

Hepatobiliary: Gallbladder and liver normal appearance

Pancreas: Diffusely enlarged pancreas with extensive peripancreatic
edema consistent with acute pancreatitis, with decrease in
peripancreatic and upper abdominal edema since the prior study.
Fluid layering along LEFT anterior pararenal fascia, decreased. No
definite evidence of pancreatic hemorrhage, mass, or
pancreatitis-associated fluid collection.

Spleen: Normal appearance

Adrenals/Urinary Tract: Adrenal glands, kidneys, and ureters normal
appearance. Bladder decompressed by Foley catheter.

Stomach/Bowel: Nasogastric tube in stomach. Normal appendix. Stomach
and bowel loops grossly normal appearance. Rectal tube present.

Vascular/Lymphatic: Aorta normal caliber. Unable to assess vascular
patency due to lack of IV contrast. No adenopathy.

Reproductive: Unremarkable prostate gland and seminal vesicles

Other: Minimal free fluid in pelvis. No free air. Tiny umbilical
hernia containing fat. LEFT inguinal hernia containing fat.

Musculoskeletal: Unremarkable
IMPRESSION: Acute pancreatitis with interval decrease in peripancreatic and
upper abdominal edema since the prior exam.

No definite evidence of pancreatic hemorrhage, mass, or
pancreatitis-associated fluid collection.

BILATERAL lower lobe consolidation LEFT greater than RIGHT.

LEFT inguinal and tiny umbilical hernias containing fat.

## 2022-12-17 ENCOUNTER — Emergency Department (HOSPITAL_COMMUNITY)
Admission: EM | Admit: 2022-12-17 | Discharge: 2022-12-18 | Disposition: A | Discharge status: Home / Self Care | Payer: MEDICAID | Attending: Emergency Medicine | Admitting: Emergency Medicine

## 2022-12-17 ENCOUNTER — Other Ambulatory Visit: Payer: Self-pay

## 2022-12-17 ENCOUNTER — Encounter (HOSPITAL_COMMUNITY): Payer: Self-pay

## 2022-12-17 DIAGNOSIS — F259 Schizoaffective disorder, unspecified: Secondary | ICD-10-CM

## 2022-12-17 DIAGNOSIS — I10 Essential (primary) hypertension: Secondary | ICD-10-CM | POA: Insufficient documentation

## 2022-12-17 DIAGNOSIS — F251 Schizoaffective disorder, depressive type: Secondary | ICD-10-CM | POA: Diagnosis present

## 2022-12-17 DIAGNOSIS — Z91148 Patient's other noncompliance with medication regimen for other reason: Secondary | ICD-10-CM | POA: Diagnosis not present

## 2022-12-17 DIAGNOSIS — E1165 Type 2 diabetes mellitus with hyperglycemia: Secondary | ICD-10-CM | POA: Insufficient documentation

## 2022-12-17 DIAGNOSIS — Z794 Long term (current) use of insulin: Secondary | ICD-10-CM | POA: Diagnosis not present

## 2022-12-17 DIAGNOSIS — Z8639 Personal history of other endocrine, nutritional and metabolic disease: Secondary | ICD-10-CM

## 2022-12-17 DIAGNOSIS — F17210 Nicotine dependence, cigarettes, uncomplicated: Secondary | ICD-10-CM | POA: Diagnosis not present

## 2022-12-17 DIAGNOSIS — Z7984 Long term (current) use of oral hypoglycemic drugs: Secondary | ICD-10-CM | POA: Diagnosis not present

## 2022-12-17 DIAGNOSIS — R739 Hyperglycemia, unspecified: Secondary | ICD-10-CM

## 2022-12-17 LAB — COMPREHENSIVE METABOLIC PANEL
ALT: 16 U/L (ref 0–44)
AST: 14 U/L — ABNORMAL LOW (ref 15–41)
Albumin: 4.5 g/dL (ref 3.5–5.0)
Alkaline Phosphatase: 191 U/L — ABNORMAL HIGH (ref 38–126)
Anion gap: 9 (ref 5–15)
BUN: 12 mg/dL (ref 6–20)
CO2: 23 mmol/L (ref 22–32)
Calcium: 9.5 mg/dL (ref 8.9–10.3)
Chloride: 102 mmol/L (ref 98–111)
Creatinine, Ser: 0.89 mg/dL (ref 0.61–1.24)
GFR, Estimated: >60 mL/min (ref >60)
Glucose, Bld: 234 mg/dL — ABNORMAL HIGH (ref 70–99)
Potassium: 4.1 mmol/L (ref 3.5–5.1)
Sodium: 134 mmol/L — ABNORMAL LOW (ref 135–145)
Total Bilirubin: 1.1 mg/dL (ref 0.3–1.2)
Total Protein: 7.5 g/dL (ref 6.5–8.1)

## 2022-12-17 LAB — CBC
HCT: 43.8 % (ref 39.0–52.0)
Hemoglobin: 14.3 g/dL (ref 13.0–17.0)
MCH: 30 pg (ref 26.0–34.0)
MCHC: 32.6 g/dL (ref 30.0–36.0)
MCV: 92 fL (ref 80.0–100.0)
Platelets: 310 10*3/uL (ref 150–400)
RBC: 4.76 MIL/uL (ref 4.22–5.81)
RDW: 13.5 % (ref 11.5–15.5)
WBC: 5.6 10*3/uL (ref 4.0–10.5)
nRBC: 0 % (ref 0.0–0.2)

## 2022-12-17 LAB — CBG MONITORING, ED
Glucose-Capillary: 153 mg/dL — ABNORMAL HIGH (ref 70–99)
Glucose-Capillary: 85 mg/dL (ref 70–99)

## 2022-12-17 LAB — ETHANOL: Alcohol, Ethyl (B): <10 mg/dL (ref <10)

## 2022-12-17 MED ORDER — ARIPIPRAZOLE 10 MG PO TABS
10.0000 mg | ORAL_TABLET | Freq: Every day | ORAL | Status: DC
  Administered 2022-12-17: 10 mg via ORAL
  Filled 2022-12-17: qty 1

## 2022-12-17 MED ORDER — FLUOXETINE HCL 20 MG PO CAPS
20.0000 mg | ORAL_CAPSULE | Freq: Every day | ORAL | Status: DC
  Administered 2022-12-17 – 2022-12-18 (×2): 20 mg via ORAL
  Filled 2022-12-17 (×2): qty 1

## 2022-12-17 MED ORDER — INSULIN DETEMIR 100 UNIT/ML FLEXPEN: 25.0000 [IU] | PEN_INJECTOR | Freq: Every day | SUBCUTANEOUS | Status: DC

## 2022-12-17 MED ORDER — QUETIAPINE FUMARATE 50 MG PO TABS: 50.0000 mg | ORAL_TABLET | Freq: Every morning | ORAL | Status: DC

## 2022-12-17 MED ORDER — METOPROLOL TARTRATE 25 MG PO TABS
50.0000 mg | ORAL_TABLET | Freq: Two times a day (BID) | ORAL | Status: DC
  Administered 2022-12-17 – 2022-12-18 (×2): 50 mg via ORAL
  Filled 2022-12-17 (×2): qty 2

## 2022-12-17 MED ORDER — FLUOXETINE HCL 20 MG PO CAPS: 20.0000 mg | ORAL_CAPSULE | Freq: Every day | ORAL | Status: DC

## 2022-12-17 MED ORDER — INSULIN DETEMIR 100 UNIT/ML ~~LOC~~ SOLN
25.0000 [IU] | Freq: Every day | SUBCUTANEOUS | Status: DC
  Administered 2022-12-17 – 2022-12-18 (×2): 25 [IU] via SUBCUTANEOUS
  Filled 2022-12-17 (×2): qty 0.25

## 2022-12-17 NOTE — ED Triage Notes (Signed)
Patient was IVC'd by his mother.  Per mother, patient has schizophrenia and DM but he has been refusing his meds stating that he is "fine" and that he is "sick and ired" of taking medications. Per mother, patient also has a hernia that he is refusing to have evaluated and he has been acting aggressively towards her.  Patient has hx of CVA.  Patient states that is mother loses track of his medications.  He denies SI/HI upon arrival

## 2022-12-17 NOTE — ED Notes (Signed)
Patient wanded 

## 2022-12-17 NOTE — Consult Note (Cosign Needed)
[facility] ED ASSESSMENT   Reason for Consult:  Psychiatry evaluation Referring Physician:  ER Physician. Patient Identification: Bryan [name] MRN:  [id number] ED Chief Complaint: Schizoaffective disorder, depressive type [facility])  Diagnosis:  Principal Problem:   Schizoaffective disorder, depressive type [facility])   ED Assessment Time Calculation: Start Time: 1412 Stop Time: 1441 Total Time in Minutes (Assessment Completion): 29   Subjective:   Bryan [name] is a 47 y.o. male patient admitted with previous hx of Schizophrenia/Schizoaffective disorder was brought in by his mother under IVC.  Per Triage note"  Patient was IVC'd by his mother. Per mother, patient has schizophrenia and DM but he has been refusing his meds stating that he is "fine" and that he is "sick and ired" of taking medications. Per mother, patient also has a hernia that he is refusing to have evaluated and he has been acting aggressively towards her. Patient has hx of CVA. Patient states that is mother loses track of his medications"  HPI:  Patient was seen in the room this afternoon resting.  He does not know the reason why came to the hospital.  Patient referred provider to speak with his mother whom he addressed as his "roommate" Chart is reviewed, Mother [name] called and she gave collateral information.  MS [name] reports that patient is diagnosed with schizophrenia/Schizoaffective disorder.  He is supposed to receive Medications and Mental health care at [facility].  He has not been to [facility] or received any Medications in 6-7 months.  Mother states patient had a stroke from using a lot of Alcohol and he is a Diabetic patient.  He is supposed to use a walker but does not do so.  He also does not check his blood sugar as he is supposed to and mother is worried about his blood sugar getting out of control or getting too low.  Mother added that he is refusing to have Hernia Surgery as suggested by his doctor. Patient is calm and  cooperative but defiant about Medications.  He has not insight in his Mental and Medical  illness.  He strongly believes that his mother is his roommate. Provider called Vesta Mixer to ask for accurate Medication list.  Staff at [facility] reports that he last come for appointment on 12th of December 2023 and that was his last injection.  She added that patient is scheduled to come get his Abilify Maintena 400 mg in October 29th.  We will start low dose oral dose while we seek bed placement.  Plan also includes offering the injection after three days of oral dose while waiting for bed placement.  We will fax out records to facilities seeking bed placement.  Past Psychiatric History: Schizoaffective disorder/Schizophrenia.  One inpatient Psychiatry hospitalization 2020 at [facility].  Risk to Self or Others: Is the patient at risk to self? No Has the patient been a risk to self in the past 6 months? No Has the patient been a risk to self within the distant past? No Is the patient a risk to others? No Has the patient been a risk to others in the past 6 months? No Has the patient been a risk to others within the distant past? No  [place] Scale:  Flowsheet Row ED from 12/17/2022 in [facility] Emergency Department at [facility] ED from 06/07/2020 in [facility] Emergency Department at [facility] Admission (Discharged) from 03/15/2020 in [facility] A  C-SSRS RISK CATEGORY No Risk Error: Question 6 not  populated No Risk       AIMS:  , , ,  ,   ASAM:    Substance Abuse:     Past Medical History:  Past Medical History:  Diagnosis Date   Controlled type 2 diabetes mellitus with hyperglycemia (HCC)    Hypertension    Psychiatric problem    Seen at [facility] for unknown reason. Takes seroquel. History of hearing voices.  Not commandivng voices.    Schizophrenia (HCC)    Tremors of nervous system     Past Surgical History:  Procedure Laterality Date    none     TRACHEOSTOMY TUBE PLACEMENT N/A 02/13/2020   Procedure: TRACHEOSTOMY;  Surgeon: Newman Pies, MD;  Location: [facility] OR;  Service: ENT;  Laterality: N/A;   Family History: History reviewed. No pertinent family history. Family Psychiatric  History: Denies Social History:  Social History   Substance and Sexual Activity  Alcohol Use Yes   Comment: says drinks a 40 at least once a week. denies drink in 2-3 weeks.      Social History   Substance and Sexual Activity  Drug Use Yes   Types: Cocaine, Marijuana   Comment: marijuana somewhat regularly    Social History   Socioeconomic History   Marital status: Married    Spouse name: Not on file   Number of children: Not on file   Years of education: Not on file   Highest education level: Not on file  Occupational History   Not on file  Tobacco Use   Smoking status: Every Day    Current packs/day: 0.50    Types: Cigarettes   Smokeless tobacco: Never  Vaping Use   Vaping status: Never Used  Substance and Sexual Activity   Alcohol use: Yes    Comment: says drinks a 40 at least once a week. denies drink in 2-3 weeks.    Drug use: Yes    Types: Cocaine, Marijuana    Comment: marijuana somewhat regularly   Sexual activity: Never  Other Topics Concern   Not on file  Social History Narrative   LIves in [place] in an apartment with his brother. Unclear why on medicaid.   Social Determinants of Health   Financial Resource Strain: Not on file  Food Insecurity: Not on file  Transportation Needs: Not on file  Physical Activity: Not on file  Stress: Not on file  Social Connections: Not on file   Additional Social History:    Allergies:   Allergies  Allergen Reactions   Fluphenazine Shortness Of Breath, Swelling and Other (See Comments)    Tongue swelling, and shaking   Coffee Bean Extract [Coffea Arabica] Swelling and Other (See Comments)    Site of swelling not noted   Vancomycin Rash and Other (See Comments)     Bullous rash over trunk, arms, and face     Labs:  Results for orders placed or performed during the hospital encounter of 12/17/22 (from the past 48 hour(s))  CBC     Status: None   Collection Time: 12/17/22 12:40 PM  Result Value Ref Range   WBC 5.6 4.0 - 10.5 K/uL   RBC 4.76 4.22 - 5.81 MIL/uL   Hemoglobin 14.3 13.0 - 17.0 g/dL   HCT 59.5 63.8 - 75.6 %   MCV 92.0 80.0 - 100.0 fL   MCH 30.0 26.0 - 34.0 pg   MCHC 32.6 30.0 - 36.0 g/dL   RDW 43.3 29.5 - 18.8 %   Platelets 310  150 - 400 K/uL   nRBC 0.0 0.0 - 0.2 %    Comment: Performed at [facility], [address], [place], [place] [address]  Comprehensive metabolic panel     Status: Abnormal   Collection Time: 12/17/22 12:40 PM  Result Value Ref Range   Sodium 134 (L) 135 - 145 mmol/L   Potassium 4.1 3.5 - 5.1 mmol/L   Chloride 102 98 - 111 mmol/L   CO2 23 22 - 32 mmol/L   Glucose, Bld 234 (H) 70 - 99 mg/dL    Comment: Glucose reference range applies only to samples taken after fasting for at least 8 hours.   BUN 12 6 - 20 mg/dL   Creatinine, Ser 6.21 0.61 - 1.24 mg/dL   Calcium 9.5 8.9 - 30.8 mg/dL   Total Protein 7.5 6.5 - 8.1 g/dL   Albumin 4.5 3.5 - 5.0 g/dL   AST 14 (L) 15 - 41 U/L   ALT 16 0 - 44 U/L   Alkaline Phosphatase 191 (H) 38 - 126 U/L   Total Bilirubin 1.1 0.3 - 1.2 mg/dL   GFR, Estimated >65 >78 mL/min    Comment: (NOTE) Calculated using the CKD-EPI Creatinine Equation (2021)    Anion gap 9 5 - 15    Comment: Performed at [facility], [address] [place], [place] [address]  Ethanol     Status: None   Collection Time: 12/17/22 12:40 PM  Result Value Ref Range   Alcohol, Ethyl (B) <10 <10 mg/dL    Comment: (NOTE) Lowest detectable limit for serum alcohol is 10 mg/dL.  For medical purposes only. Performed at [facility], [address], [place], [place] [address]     Current Facility-Administered Medications  Medication Dose Route  Frequency Provider Last Rate Last Admin   ARIPiprazole (ABILIFY) tablet 10 mg  10 mg Oral QHS Dahlia Byes C, NP       FLUoxetine (PROZAC) capsule 20 mg  20 mg Oral Daily Dahlia Byes C, NP   20 mg at 12/17/22 1454   Current Outpatient Medications  Medication Sig Dispense Refill   acetaminophen (TYLENOL) 500 MG tablet Take 1 tablet (500 mg total) by mouth every 6 (six) hours. (Patient taking differently: Take 500 mg by mouth every 6 (six) hours as needed for mild pain (pain score 1-3) or headache.) 30 tablet 0   insulin detemir (LEVEMIR) 100 UNIT/ML FlexPen Inject 25 Units into the skin daily. (Patient taking differently: Inject 50 Units into the skin in the morning.) 9 mL 0   docusate sodium (COLACE) 100 MG capsule Take 1 capsule (100 mg total) by mouth 2 (two) times daily. (Patient not taking: Reported on 12/17/2022) 60 capsule 0   FLUoxetine (PROZAC) 20 MG capsule Take 1 capsule (20 mg total) by mouth daily. (Patient not taking: Reported on 12/17/2022) 30 capsule 0   Insulin Pen Needle (AUM MINI PEN NEEDLES) 33G X 5 MM MISC 1 application by Does not apply route daily. 100 each 1   metFORMIN (GLUCOPHAGE-XR) 500 MG 24 hr tablet Take 1 tablet (500 mg total) by mouth 2 (two) times daily with a meal. (Patient not taking: Reported on 12/17/2022) 60 tablet 0   metoprolol tartrate (LOPRESSOR) 50 MG tablet Take 1 tablet (50 mg total) by mouth 2 (two) times daily. (Patient not taking: Reported on 12/17/2022) 60 tablet 0   Multiple Vitamin (MULTIVITAMIN WITH MINERALS) TABS tablet Take 1 tablet by mouth daily. (Patient not taking:  Reported on 12/17/2022)     pantoprazole (PROTONIX) 40 MG tablet Take 1 tablet (40 mg total) by mouth daily. (Patient not taking: Reported on 12/17/2022) 30 tablet 0   QUEtiapine (SEROQUEL) 50 MG tablet Take 1 tablet (50 mg total) by mouth every morning. (Patient not taking: Reported on 12/17/2022) 30 tablet 0    Musculoskeletal: Strength & Muscle Tone:  sitting down  during our interaction but usually utilizes Environmental consultant after a stroke. Gait & Station:  see above Patient leans:  see above.   Psychiatric Specialty Exam: Presentation  General Appearance:  Casual  Eye Contact: Fleeting  Speech: Clear and Coherent; Normal Rate  Speech Volume: Normal  Handedness:No data recorded  Mood and Affect  Mood: Angry; Depressed  Affect: Congruent; Depressed   Thought Process  Thought Processes: Coherent  Descriptions of Associations:Intact  Orientation:Partial  Thought Content:Logical  History of Schizophrenia/Schizoaffective disorder:No data recorded Duration of Psychotic Symptoms:No data recorded Hallucinations:Hallucinations: None  Ideas of Reference:None  Suicidal Thoughts:Suicidal Thoughts: No  Homicidal Thoughts:Homicidal Thoughts: No   Sensorium  Memory: Immediate Fair; Recent Fair; Remote Fair  Judgment: Poor  Insight: Poor   Executive Functions  Concentration: Fair  Attention Span: Fair  Recall: Poor  Fund of Knowledge: Poor  Language: Fair   Psychomotor Activity  Psychomotor Activity: Psychomotor Activity: Normal   Assets  Assets: Housing; Communication Skills    Sleep  Sleep: Sleep: Fair   Physical Exam: Physical Exam Vitals and nursing note reviewed.  Constitutional:      Appearance: Normal appearance.  HENT:     Nose: Nose normal.  Cardiovascular:     Rate and Rhythm: Tachycardia present.  Pulmonary:     Effort: Pulmonary effort is normal.  Musculoskeletal:     Comments: Utilizes walker, s/p cva.  Skin:    General: Skin is dry.  Neurological:     Mental Status: He is alert and oriented to person, place, and time.  Psychiatric:        Attention and Perception: Attention and perception normal.        Mood and Affect: Mood is depressed.        Speech: Speech is delayed.        Behavior: Behavior is cooperative.        Thought Content: Thought content normal.         Judgment: Judgment is inappropriate.    Review of Systems  Constitutional: Negative.   HENT: Negative.    Eyes: Negative.   Respiratory: Negative.    Cardiovascular: Negative.   Gastrointestinal: Negative.   Genitourinary: Negative.   Musculoskeletal:        S/P CVA, uses Walker for ambulation.  Skin: Negative.   Neurological:  Positive for weakness.  Endo/Heme/Allergies: Negative.   Psychiatric/Behavioral:  Positive for depression.    Blood pressure (!) 148/98, pulse (!) 110, temperature 98.7 F (37.1 C), temperature source Oral, resp. rate 18, height 5\' 11"  (1.803 m), weight 77.1 kg, SpO2 100%. Body mass index is 23.71 kg/m.  Medical Decision Making: Patient is non compliant with Medications.  Patient states he is tired of taking Medications.  He is on Monthly Aripiprazole injection and is not keeping appointment for the injection.  Plan is to restart Aripiprazole oral tablet today and offer injection in few days.  He has not had both oral and injection since December 12th 2023.  We will seek inpatient Mental health hospitalization.  Disposition:  Admit, Seek bed placement   Earney Navy, NP-PMHNP-BC 12/17/2022 2:56  PM

## 2022-12-17 NOTE — Progress Notes (Signed)
LCSW Progress Note  [id number]   Bryan [name]  12/17/2022  3:22 PM  Description:   Inpatient Psychiatric Referral  Patient was recommended inpatient per Dahlia Byes, NP. There are no available beds at [facility], per [facility] [name], RN. Patient was referred to the following out of network facilities:   Destination  Service Provider Address Phone Fax  [facility]  [place] [address] [phone]  [facility]  [address] [place] [address] [phone]  [facility]  [address], [place] [address] [phone]  [facility] [address] [place]  [address] [place], [place] [address] [phone]  [facility]  [address] [place], [place] [address] [phone]  [facility]  [address], [place] [address] [phone]  [facility]  [address] [place], [place] [address] [phone]  [facility]  [address] [place] [address] [phone]  [facility]  [address] [place] [address] [phone]  [facility]  [address], [place] [address] [phone]  [facility]  [address] [place] [address] [phone]  [facility]  [address] [place] [address] [phone]  [facility]  [place] [phone]  [facility] EFAX  [address] [place], [place] [phone]  [facility]  [address] [place] [address] [phone]  [facility]  [address] [place] [address] [phone]  [facility]  [address] [place], [place] [address] [phone]  [facility]  [address], [place] [address] [phone]  [facility] EFAX  [place] [phone]    Situation ongoing, CSW to continue following and update chart as more information becomes available.      Cathie Beams, MSW, LCSW  12/17/2022 3:22 PM

## 2022-12-17 NOTE — ED Provider Notes (Signed)
EMERGENCY DEPARTMENT AT [facility] Provider Note   CSN: [id number] Arrival date & time: 12/17/22  1100     History  Chief Complaint  Patient presents with   IVC    Bryan Hinnenkamp is a 47 y.o. male.  Pt with hx schizophrenia, diabetes, arrives via [facility], ivc'd by parent who indicates patient not taking his meds and giving appropriate attention to his health issues, whether physical or behavioral.  Indicates at times labile moods.  Pt limited historian - level 5 caveat, psych illness, says 'IM fine' but seems to lack insight into current issues and/or concerns. Indicates he does not need to take medication on regular basis. Normal appetite. Denies depression. Denies thoughts of harm to self or others.   The history is provided by the patient, medical records and the police. The history is limited by the condition of the patient.       Home Medications Prior to Admission medications   Medication Sig Start Date End Date Taking? Authorizing Provider  acetaminophen (TYLENOL) 500 MG tablet Take 1 tablet (500 mg total) by mouth every 6 (six) hours. Patient not taking: Reported on 07/12/2020 04/08/20   Love, Evlyn Kanner, PA-C  docusate sodium (COLACE) 100 MG capsule Take 1 capsule (100 mg total) by mouth 2 (two) times daily. Patient not taking: Reported on 07/12/2020 04/08/20   Love, Evlyn Kanner, PA-C  FLUoxetine (PROZAC) 20 MG capsule Take 1 capsule (20 mg total) by mouth daily. 04/09/20   Love, Evlyn Kanner, PA-C  insulin detemir (LEVEMIR) 100 UNIT/ML FlexPen Inject 25 Units into the skin daily. 05/19/20   Marcello Fennel, MD  Insulin Pen Needle (AUM MINI PEN NEEDLES) 33G X 5 MM MISC 1 application by Does not apply route daily. 04/08/20   Love, Evlyn Kanner, PA-C  metFORMIN (GLUCOPHAGE-XR) 500 MG 24 hr tablet Take 1 tablet (500 mg total) by mouth 2 (two) times daily with a meal. 04/08/20   Love, Evlyn Kanner, PA-C  metoprolol tartrate (LOPRESSOR) 50 MG tablet Take 1 tablet (50 mg total) by  mouth 2 (two) times daily. 04/08/20   Love, Evlyn Kanner, PA-C  Multiple Vitamin (MULTIVITAMIN WITH MINERALS) TABS tablet Take 1 tablet by mouth daily. Patient not taking: Reported on 07/12/2020 03/15/20   Belva Agee, MD  pantoprazole (PROTONIX) 40 MG tablet Take 1 tablet (40 mg total) by mouth daily. 04/08/20   Love, Evlyn Kanner, PA-C  QUEtiapine (SEROQUEL) 50 MG tablet Take 1 tablet (50 mg total) by mouth every morning. 04/09/20   Love, Evlyn Kanner, PA-C      Allergies    Fluphenazine, Coffee bean extract [coffea arabica], and Vancomycin    Review of Systems   Review of Systems  Constitutional:  Negative for fever.  Respiratory:  Negative for shortness of breath.   Cardiovascular:  Negative for chest pain.  Gastrointestinal:  Negative for abdominal pain, diarrhea and vomiting.  Genitourinary:  Negative for flank pain.  Musculoskeletal:  Negative for back pain and neck pain.  Skin:  Negative for rash.  Neurological:  Negative for headaches.    Physical Exam Updated Vital Signs BP (!) 148/98 (BP Location: Right Arm)   Pulse (!) 110   Temp 98.7 F (37.1 C) (Oral)   Resp 18   Ht 1.803 m (5\' 11" )   Wt 77.1 kg   SpO2 100%   BMI 23.71 kg/m  Physical Exam Vitals and nursing note reviewed.  Constitutional:      Appearance: Normal appearance. He is  well-developed.  HENT:     Head: Atraumatic.     Nose: Nose normal.     Mouth/Throat:     Mouth: Mucous membranes are moist.  Eyes:     General: No scleral icterus.    Conjunctiva/sclera: Conjunctivae normal.     Pupils: Pupils are equal, round, and reactive to light.  Neck:     Trachea: No tracheal deviation.  Cardiovascular:     Rate and Rhythm: Normal rate and regular rhythm.     Pulses: Normal pulses.     Heart sounds: Normal heart sounds. No murmur heard.    No friction rub. No gallop.  Pulmonary:     Effort: Pulmonary effort is normal. No accessory muscle usage or respiratory distress.     Breath sounds: Normal breath  sounds.  Abdominal:     General: Bowel sounds are normal. There is no distension.     Palpations: Abdomen is soft.     Tenderness: There is no abdominal tenderness.  Genitourinary:    Comments: No cva tenderness. Musculoskeletal:        General: No swelling.     Cervical back: Normal range of motion and neck supple. No rigidity.  Skin:    General: Skin is warm and dry.     Findings: No rash.  Neurological:     Mental Status: He is alert.     Comments: Alert, speech clear. Motor/sens grossly intact bil. Steady gait.   Psychiatric:        Mood and Affect: Mood normal.     Comments: Normal mood and affect. Calm, cooperative. No SI/HI.  Pt does not appear to be actively responding to internal stimuli - no hallucinations noted. Pt w relatively limited insight to and/or understanding of current concerns of family, and/or of medical and behavioral health issues.      ED Results / Procedures / Treatments   Labs (all labs ordered are listed, but only abnormal results are displayed) Results for orders placed or performed during the hospital encounter of 12/17/22  CBC  Result Value Ref Range   WBC 5.6 4.0 - 10.5 K/uL   RBC 4.76 4.22 - 5.81 MIL/uL   Hemoglobin 14.3 13.0 - 17.0 g/dL   HCT 21.3 08.6 - 57.8 %   MCV 92.0 80.0 - 100.0 fL   MCH 30.0 26.0 - 34.0 pg   MCHC 32.6 30.0 - 36.0 g/dL   RDW 46.9 62.9 - 52.8 %   Platelets 310 150 - 400 K/uL   nRBC 0.0 0.0 - 0.2 %  Comprehensive metabolic panel  Result Value Ref Range   Sodium 134 (L) 135 - 145 mmol/L   Potassium 4.1 3.5 - 5.1 mmol/L   Chloride 102 98 - 111 mmol/L   CO2 23 22 - 32 mmol/L   Glucose, Bld 234 (H) 70 - 99 mg/dL   BUN 12 6 - 20 mg/dL   Creatinine, Ser 4.13 0.61 - 1.24 mg/dL   Calcium 9.5 8.9 - 24.4 mg/dL   Total Protein 7.5 6.5 - 8.1 g/dL   Albumin 4.5 3.5 - 5.0 g/dL   AST 14 (L) 15 - 41 U/L   ALT 16 0 - 44 U/L   Alkaline Phosphatase 191 (H) 38 - 126 U/L   Total Bilirubin 1.1 0.3 - 1.2 mg/dL   GFR, Estimated >01  >02 mL/min   Anion gap 9 5 - 15  Ethanol  Result Value Ref Range   Alcohol, Ethyl (B) <10 <10 mg/dL  EKG EKG Interpretation Date/Time:  Monday December 17 2022 12:57:33 EDT Ventricular Rate:  100 PR Interval:  146 QRS Duration:  72 QT Interval:  324 QTC Calculation: 417 R Axis:   14  Text Interpretation: Normal sinus rhythm Normal ECG When compared with ECG of 17-Dec-2022 12:56, PREVIOUS ECG IS PRESENT Confirmed by Cathren Laine ([id number]) on 12/17/2022 1:14:29 PM  Radiology No results found.  Procedures Procedures    Medications Ordered in ED Medications - No data to display  ED Course/ Medical Decision Making/ A&P                                 Medical Decision Making Problems Addressed: Essential hypertension: chronic illness or injury with exacerbation, progression, or side effects of treatment that poses a threat to life or bodily functions History of diabetes mellitus: chronic illness or injury Hyperglycemia: acute illness or injury Non compliance w medication regimen: acute illness or injury Schizoaffective disorder, unspecified type (HCC): acute illness or injury with systemic symptoms that poses a threat to life or bodily functions    Details: Acute on chronic  Amount and/or Complexity of Data Reviewed Independent Historian:     Details: Simonne Come, ivc External Data Reviewed: notes. Labs: ordered. Decision-making details documented in ED Course. ECG/medicine tests: ordered and independent interpretation performed. Decision-making details documented in ED Course. Discussion of management or test interpretation with external provider(s): [facility] team.   Risk Prescription drug management. Decision regarding hospitalization.   Labs ordered/sent.   Differential diagnosis includes acute psychosis, noncomp w meds, etc. Dispo decision including potential need for admission considered - will get labs and reassess.   Reviewed nursing notes and prior charts for  additional history. External reports reviewed. Additional history from: [facility], family.   Cardiac monitor: sinus rhythm, rate 100.  Labs reviewed/interpreted by me - wbc and hgb normal. Glucose mildly high, hx dm. Hco3 normal.   The patient has been placed in psychiatric observation due to the need to provide a safe environment for the patient while obtaining psychiatric consultation and evaluation, as well as ongoing medical and medication management to treat the patient's condition.  The patient has been placed under full IVC at this time.  Recheck, calm, alert, no distress.   Dispo and bh meds per [facility] team.   Pharmacy med rec pending for home meds - once complete, will need to restart home meds.           Final Clinical Impression(s) / ED Diagnoses Final diagnoses:  Schizoaffective disorder, unspecified type (HCC)  Non compliance w medication regimen    Rx / DC Orders ED Discharge Orders     None         Cathren Laine, MD 12/17/22 1318

## 2022-12-18 ENCOUNTER — Encounter (HOSPITAL_COMMUNITY): Payer: Self-pay

## 2022-12-18 DIAGNOSIS — F251 Schizoaffective disorder, depressive type: Secondary | ICD-10-CM

## 2022-12-18 LAB — CBG MONITORING, ED
Glucose-Capillary: 95 mg/dL (ref 70–99)
Glucose-Capillary: 179 mg/dL — ABNORMAL HIGH (ref 70–99)

## 2022-12-18 MED ORDER — ARIPIPRAZOLE 10 MG PO TABS: 10.0000 mg | ORAL_TABLET | Freq: Every day | ORAL | 0 refills | Status: AC

## 2022-12-18 MED ORDER — QUETIAPINE FUMARATE 50 MG PO TABS: 50.0000 mg | ORAL_TABLET | Freq: Every day | ORAL | Status: DC

## 2022-12-18 MED ORDER — QUETIAPINE FUMARATE 50 MG PO TABS: 50.0000 mg | ORAL_TABLET | Freq: Every day | ORAL | 0 refills | Status: AC

## 2022-12-18 NOTE — ED Provider Notes (Signed)
Emergency Medicine Observation Re-evaluation Note  Bryan [name] is a 47 y.o. male, seen on rounds today.  Pt initially presented to the ED for complaints of IVC Currently, the patient is resting comfortably.  Physical Exam  BP (!) 117/93 (BP Location: Left Arm)   Pulse (!) 106   Temp 97.9 F (36.6 C) (Oral)   Resp 18   Ht 1.803 m (5\' 11" )   Wt 77.1 kg   SpO2 100%   BMI 23.71 kg/m  Physical Exam   ED Course / MDM  EKG:EKG Interpretation Date/Time:  Monday December 17 2022 12:57:33 EDT Ventricular Rate:  100 PR Interval:  146 QRS Duration:  72 QT Interval:  324 QTC Calculation: 417 R Axis:   14  Text Interpretation: Normal sinus rhythm Normal ECG When compared with ECG of 17-Dec-2022 12:56, PREVIOUS ECG IS PRESENT Confirmed by Cathren Laine ([id number]) on 12/17/2022 1:14:29 PM  I have reviewed the labs performed to date as well as medications administered while in observation.  Recent changes in the last 24 hours include improved mood and affect.  Plan  Current plan is for placement.    Lorre Nick, MD 12/18/22 [phone]

## 2022-12-18 NOTE — ED Notes (Signed)
Patient calm and cooperative tonight. Patient compliant with medicine. Will continue to monitor.

## 2022-12-18 NOTE — Discharge Summary (Signed)
[facility] Psych ED Discharge  12/18/2022 11:58 AM Bryan [name]  MRN:  [id number]  Principal Problem: Schizoaffective disorder, depressive type [facility]) Discharge Diagnoses: Principal Problem:   Schizoaffective disorder, depressive type (HCC)  Clinical Impression:  Final diagnoses:  Schizoaffective disorder, unspecified type (HCC)  Non compliance w medication regimen  Hyperglycemia  History of diabetes mellitus  Essential hypertension   Subjective: Bryan [name] is a 47 y.o. male patient admitted with previous hx of Schizophrenia/Schizoaffective disorder was brought in by his mother under IVC.  Per Triage note"  Patient was IVC'd by his mother. Per mother, patient has schizophrenia and DM but he has been refusing his meds stating that he is "fine" and that he is "sick and ired" of taking medications. Per mother, patient also has a hernia that he is refusing to have evaluated and he has been acting aggressively towards her. Patient has hx of CVA. Patient states that is mother loses track of his medications"  Patient since arrival have remains calm, cooperative and have been taking his Medications.  He remains surprised that his mother IVC for no reason but not taking his Medications.  Patient admitted that he did not take his his insulin for three days and have not had his Monthly injection for a while.  He is interested in getting his injection next week the 29th at [facility].  Patient does not argue today that he need to take care of his mental health and DM.  We have printed and given to him information about DM and consequences of not taking care of himself.  Patient is in agreement to go get his Abilify injection next Tuesday and meanwhile 14 days Aripiprazole oral is given and Seroquel 7 days for night sleep also given. Patient denies si/hi/avh.  Patient is Psychiatrically cleared .  Discharge was reviewed with DR Lucianne Muss, Psychiatrist who is in agreement.  ED Assessment Time Calculation: Start  Time: 1133 Stop Time: 1157 Total Time in Minutes (Assessment Completion): 24   Past Psychiatric History: see initial Psychiatry evaluation note  Past Medical History:  Past Medical History:  Diagnosis Date   Controlled type 2 diabetes mellitus with hyperglycemia (HCC)    Hypertension    Psychiatric problem    Seen at [facility] for unknown reason. Takes seroquel. History of hearing voices.  Not commandivng voices.    Schizophrenia (HCC)    Tremors of nervous system     Past Surgical History:  Procedure Laterality Date   none     TRACHEOSTOMY TUBE PLACEMENT N/A 02/13/2020   Procedure: TRACHEOSTOMY;  Surgeon: Newman Pies, MD;  Location: [facility] OR;  Service: ENT;  Laterality: N/A;   Family History: History reviewed. No pertinent family history. Family Psychiatric  History: see initial Psychiatry evaluation note Social History:  Social History   Substance and Sexual Activity  Alcohol Use Yes   Comment: says drinks a 40 at least once a week. denies drink in 2-3 weeks.      Social History   Substance and Sexual Activity  Drug Use Yes   Types: Cocaine, Marijuana   Comment: marijuana somewhat regularly    Social History   Socioeconomic History   Marital status: Married    Spouse name: Not on file   Number of children: Not on file   Years of education: Not on file   Highest education level: Not on file  Occupational History   Not on file  Tobacco Use   Smoking status: Every Day    Current packs/day: 0.50  Types: Cigarettes   Smokeless tobacco: Never  Vaping Use   Vaping status: Never Used  Substance and Sexual Activity   Alcohol use: Yes    Comment: says drinks a 40 at least once a week. denies drink in 2-3 weeks.    Drug use: Yes    Types: Cocaine, Marijuana    Comment: marijuana somewhat regularly   Sexual activity: Never  Other Topics Concern   Not on file  Social History Narrative   [place] in [place] in an apartment with his brother. Unclear why on medicaid.    Social Determinants of Health   Financial Resource Strain: Not on file  Food Insecurity: Not on file  Transportation Needs: Not on file  Physical Activity: Not on file  Stress: Not on file  Social Connections: Not on file    Tobacco Cessation:  N/A, patient does not currently use tobacco products  Current Medications: Current Facility-Administered Medications  Medication Dose Route Frequency Provider Last Rate Last Admin   ARIPiprazole (ABILIFY) tablet 10 mg  10 mg Oral QHS Dafna Romo C, NP   10 mg at 12/17/22 2043   FLUoxetine (PROZAC) capsule 20 mg  20 mg Oral Daily Dalila Arca C, NP   20 mg at 12/18/22 1015   insulin detemir (LEVEMIR) injection 25 Units  25 Units Subcutaneous Daily Cathren Laine, MD   25 Units at 12/18/22 1013   metoprolol tartrate (LOPRESSOR) tablet 50 mg  50 mg Oral BID Cathren Laine, MD   50 mg at 12/18/22 1015   [START ON 12/19/2022] QUEtiapine (SEROQUEL) tablet 50 mg  50 mg Oral QHS Dahlia Byes C, NP       Current Outpatient Medications  Medication Sig Dispense Refill   ARIPiprazole ER (ABILIFY MAINTENA) 400 MG PRSY prefilled syringe Inject 400 mg into the muscle every 28 (twenty-eight) days.     insulin detemir (LEVEMIR) 100 UNIT/ML FlexPen Inject 25 Units into the skin daily. (Patient taking differently: Inject 50 Units into the skin in the morning.) 9 mL 0   ARIPiprazole (ABILIFY) 10 MG tablet Take 1 tablet (10 mg total) by mouth at bedtime for 14 days. 14 tablet 0   docusate sodium (COLACE) 100 MG capsule Take 1 capsule (100 mg total) by mouth 2 (two) times daily. (Patient not taking: Reported on 12/17/2022) 60 capsule 0   FLUoxetine (PROZAC) 20 MG capsule Take 1 capsule (20 mg total) by mouth daily. (Patient not taking: Reported on 12/17/2022) 30 capsule 0   Insulin Pen Needle (AUM MINI PEN NEEDLES) 33G X 5 MM MISC 1 application by Does not apply route daily. 100 each 1   metoprolol tartrate (LOPRESSOR) 50 MG tablet Take 1 tablet (50  mg total) by mouth 2 (two) times daily. (Patient not taking: Reported on 12/17/2022) 60 tablet 0   Multiple Vitamin (MULTIVITAMIN WITH MINERALS) TABS tablet Take 1 tablet by mouth daily. (Patient not taking: Reported on 12/17/2022)     pantoprazole (PROTONIX) 40 MG tablet Take 1 tablet (40 mg total) by mouth daily. (Patient not taking: Reported on 12/17/2022) 30 tablet 0   [START ON 12/19/2022] QUEtiapine (SEROQUEL) 50 MG tablet Take 1 tablet (50 mg total) by mouth at bedtime for 7 days. 7 tablet 0   PTA Medications: (Not in a hospital admission)   [place] Scale:  Flowsheet Row ED from 12/17/2022 in [facility] Emergency Department at [facility] ED from 06/07/2020 in [facility] Emergency Department at [facility] Admission (Discharged) from 03/15/2020 in [facility] A  C-SSRS RISK CATEGORY No Risk Error: Question 6 not populated No Risk       Musculoskeletal: Strength & Muscle Tone: within normal limits Gait & Station: normal Patient leans: Front  Psychiatric Specialty Exam: Presentation  General Appearance:  Casual; Neat  Eye Contact: Good  Speech: Clear and Coherent; Normal Rate  Speech Volume: Normal  Handedness: Right   Mood and Affect  Mood: Euthymic  Affect: Congruent   Thought Process  Thought Processes: Coherent; Goal Directed  Descriptions of Associations:Intact  Orientation:Full (Time, Place and Person)  Thought Content:Logical  History of Schizophrenia/Schizoaffective disorder:No data recorded Duration of Psychotic Symptoms:No data recorded Hallucinations:Hallucinations: None  Ideas of Reference:None  Suicidal Thoughts:Suicidal Thoughts: No  Homicidal Thoughts:Homicidal Thoughts: No   Sensorium  Memory: Immediate Good; Recent Good; Remote Good  Judgment: Fair  Insight: Poor   Executive Functions  Concentration: Good  Attention Span: Good  Recall: Good  Fund of  Knowledge: Good  Language: Good   Psychomotor Activity  Psychomotor Activity: Psychomotor Activity: Normal   Assets  Assets: Communication Skills; Housing; Social Support; Desire for Improvement   Sleep  Sleep: Sleep: Good    Physical Exam: Physical Exam Vitals and nursing note reviewed.  Constitutional:      Appearance: Normal appearance.  HENT:     Nose: Nose normal.  Cardiovascular:     Rate and Rhythm: Normal rate and regular rhythm.  Pulmonary:     Effort: Pulmonary effort is normal.  Musculoskeletal:        General: Normal range of motion.  Skin:    General: Skin is dry.  Neurological:     Mental Status: He is alert and oriented to person, place, and time.  Psychiatric:        Attention and Perception: Attention and perception normal.        Mood and Affect: Mood is depressed.        Speech: Speech normal.        Behavior: Behavior normal.        Thought Content: Thought content normal.        Cognition and Memory: Cognition and memory normal.        Judgment: Judgment normal.    Review of Systems  Constitutional: Negative.   HENT: Negative.    Eyes: Negative.   Respiratory: Negative.    Cardiovascular: Negative.   Gastrointestinal: Negative.   Genitourinary: Negative.   Musculoskeletal: Negative.   Skin: Negative.   Neurological: Negative.   Endo/Heme/Allergies: Negative.   Psychiatric/Behavioral:  Positive for depression.    Blood pressure 127/82, pulse 70, temperature 98.7 F (37.1 C), temperature source Oral, resp. rate 16, height 5\' 11"  (1.803 m), weight 77.1 kg, SpO2 99%. Body mass index is 23.71 kg/m.   Demographic Factors:  Male, Adolescent or young adult, Low socioeconomic status, and Unemployed  Loss Factors: NA  Historical Factors: NA  Risk Reduction Factors:   Living with another person, especially a relative  Continued Clinical Symptoms:  Schizophrenia:   Depressive state  Cognitive Features That Contribute To  Risk:  None    Suicide Risk:  Minimal: No identifiable suicidal ideation.  Patients presenting with no risk factors but with morbid ruminations; may be classified as minimal risk based on the severity of the depressive symptoms    Plan Of Care/Follow-up recommendations:  Activity:  as tolerated Diet:  Regular  Medical Decision Making: Patient remains calm and cooperative.  He is doing well since  arrival in the hospital  Patient is educated on the need to take his Medications as prescribed both Medical and Psychiatry hospitalization.  Patient is in agreement.  We reviewed safety plans-go to [place] your ACT team or utilize [facility] health facility for for mental health crisis.  Patient also advised to call [phone] for mental health crisis.   Disposition: Psychiatrically cleared. Earney Navy, NP-PMHNP-BC 12/18/2022, 11:58 AM
